# Patient Record
Sex: Female | Born: 1950 | Race: White | Hispanic: No | Marital: Married | Smoking: Former smoker
Health system: Southern US, Community
[De-identification: ages and names within clinical notes are randomized; demographics above are authoritative.]

## PROBLEM LIST (undated history)

## (undated) DIAGNOSIS — R0600 Dyspnea, unspecified: Secondary | ICD-10-CM

## (undated) DIAGNOSIS — T8859XA Other complications of anesthesia, initial encounter: Secondary | ICD-10-CM

## (undated) DIAGNOSIS — I341 Nonrheumatic mitral (valve) prolapse: Secondary | ICD-10-CM

## (undated) DIAGNOSIS — Z9889 Other specified postprocedural states: Secondary | ICD-10-CM

## (undated) DIAGNOSIS — T4145XA Adverse effect of unspecified anesthetic, initial encounter: Secondary | ICD-10-CM

## (undated) DIAGNOSIS — D689 Coagulation defect, unspecified: Secondary | ICD-10-CM

## (undated) DIAGNOSIS — N189 Chronic kidney disease, unspecified: Secondary | ICD-10-CM

## (undated) DIAGNOSIS — K5790 Diverticulosis of intestine, part unspecified, without perforation or abscess without bleeding: Secondary | ICD-10-CM

## (undated) DIAGNOSIS — R51 Headache: Secondary | ICD-10-CM

## (undated) DIAGNOSIS — R112 Nausea with vomiting, unspecified: Secondary | ICD-10-CM

## (undated) DIAGNOSIS — E785 Hyperlipidemia, unspecified: Secondary | ICD-10-CM

## (undated) DIAGNOSIS — M1990 Unspecified osteoarthritis, unspecified site: Secondary | ICD-10-CM

## (undated) DIAGNOSIS — H269 Unspecified cataract: Secondary | ICD-10-CM

## (undated) DIAGNOSIS — R519 Headache, unspecified: Secondary | ICD-10-CM

## (undated) DIAGNOSIS — K449 Diaphragmatic hernia without obstruction or gangrene: Secondary | ICD-10-CM

## (undated) DIAGNOSIS — E119 Type 2 diabetes mellitus without complications: Secondary | ICD-10-CM

## (undated) DIAGNOSIS — K861 Other chronic pancreatitis: Secondary | ICD-10-CM

## (undated) DIAGNOSIS — D649 Anemia, unspecified: Secondary | ICD-10-CM

## (undated) DIAGNOSIS — K760 Fatty (change of) liver, not elsewhere classified: Secondary | ICD-10-CM

## (undated) DIAGNOSIS — Z87442 Personal history of urinary calculi: Secondary | ICD-10-CM

## (undated) DIAGNOSIS — K219 Gastro-esophageal reflux disease without esophagitis: Secondary | ICD-10-CM

## (undated) DIAGNOSIS — I10 Essential (primary) hypertension: Secondary | ICD-10-CM

## (undated) HISTORY — DX: Gastro-esophageal reflux disease without esophagitis: K21.9

## (undated) HISTORY — PX: BREAST BIOPSY: SHX20

## (undated) HISTORY — PX: TONSILLECTOMY: SUR1361

## (undated) HISTORY — DX: Diaphragmatic hernia without obstruction or gangrene: K44.9

## (undated) HISTORY — DX: Hyperlipidemia, unspecified: E78.5

## (undated) HISTORY — PX: PARTIAL HYSTERECTOMY: SHX80

## (undated) HISTORY — PX: OTHER SURGICAL HISTORY: SHX169

## (undated) HISTORY — DX: Fatty (change of) liver, not elsewhere classified: K76.0

## (undated) HISTORY — DX: Anemia, unspecified: D64.9

## (undated) HISTORY — DX: Unspecified osteoarthritis, unspecified site: M19.90

## (undated) HISTORY — DX: Chronic kidney disease, unspecified: N18.9

## (undated) HISTORY — DX: Nonrheumatic mitral (valve) prolapse: I34.1

## (undated) HISTORY — DX: Diverticulosis of intestine, part unspecified, without perforation or abscess without bleeding: K57.90

## (undated) HISTORY — DX: Unspecified cataract: H26.9

## (undated) HISTORY — DX: Coagulation defect, unspecified: D68.9

---

## 1978-06-07 HISTORY — PX: TUBAL LIGATION: SHX77

## 1998-06-07 HISTORY — PX: BREAST SURGERY: SHX581

## 1998-09-10 ENCOUNTER — Other Ambulatory Visit: Admission: RE | Admit: 1998-09-10 | Discharge: 1998-09-10 | Payer: Self-pay | Admitting: Obstetrics and Gynecology

## 1999-05-12 ENCOUNTER — Encounter: Payer: Self-pay | Admitting: Emergency Medicine

## 1999-05-12 ENCOUNTER — Encounter: Admission: RE | Admit: 1999-05-12 | Discharge: 1999-05-12 | Payer: Self-pay | Admitting: Emergency Medicine

## 2000-10-17 ENCOUNTER — Encounter: Admission: RE | Admit: 2000-10-17 | Discharge: 2000-10-17 | Payer: Self-pay

## 2001-10-18 ENCOUNTER — Encounter: Admission: RE | Admit: 2001-10-18 | Discharge: 2001-10-18 | Payer: Self-pay | Admitting: Obstetrics and Gynecology

## 2001-10-18 ENCOUNTER — Encounter: Payer: Self-pay | Admitting: Obstetrics and Gynecology

## 2002-11-09 ENCOUNTER — Encounter: Payer: Self-pay | Admitting: Obstetrics and Gynecology

## 2002-11-09 ENCOUNTER — Encounter: Admission: RE | Admit: 2002-11-09 | Discharge: 2002-11-09 | Payer: Self-pay | Admitting: Obstetrics and Gynecology

## 2002-12-07 ENCOUNTER — Encounter: Payer: Self-pay | Admitting: Obstetrics and Gynecology

## 2002-12-07 ENCOUNTER — Encounter: Admission: RE | Admit: 2002-12-07 | Discharge: 2002-12-07 | Payer: Self-pay | Admitting: Obstetrics and Gynecology

## 2003-06-08 HISTORY — PX: CATARACT EXTRACTION: SUR2

## 2003-11-19 ENCOUNTER — Encounter: Admission: RE | Admit: 2003-11-19 | Discharge: 2003-11-19 | Payer: Self-pay | Admitting: Obstetrics and Gynecology

## 2004-10-19 ENCOUNTER — Encounter: Admission: RE | Admit: 2004-10-19 | Discharge: 2004-10-19 | Payer: Self-pay | Admitting: Emergency Medicine

## 2004-10-22 ENCOUNTER — Encounter: Admission: RE | Admit: 2004-10-22 | Discharge: 2004-10-22 | Payer: Self-pay | Admitting: Emergency Medicine

## 2004-12-01 ENCOUNTER — Encounter: Admission: RE | Admit: 2004-12-01 | Discharge: 2004-12-01 | Payer: Self-pay | Admitting: Obstetrics and Gynecology

## 2005-12-02 ENCOUNTER — Encounter: Admission: RE | Admit: 2005-12-02 | Discharge: 2005-12-02 | Payer: Self-pay | Admitting: Obstetrics and Gynecology

## 2006-05-07 LAB — HM COLONOSCOPY: HM Colonoscopy: NORMAL

## 2006-05-17 ENCOUNTER — Encounter: Admission: RE | Admit: 2006-05-17 | Discharge: 2006-05-17 | Payer: Self-pay | Admitting: Emergency Medicine

## 2006-12-06 ENCOUNTER — Encounter: Admission: RE | Admit: 2006-12-06 | Discharge: 2006-12-06 | Payer: Self-pay | Admitting: Obstetrics and Gynecology

## 2006-12-06 LAB — CONVERTED CEMR LAB: Pap Smear: NORMAL

## 2007-01-29 ENCOUNTER — Encounter: Admission: RE | Admit: 2007-01-29 | Discharge: 2007-01-29 | Payer: Self-pay | Admitting: Orthopedic Surgery

## 2007-12-07 ENCOUNTER — Encounter: Admission: RE | Admit: 2007-12-07 | Discharge: 2007-12-07 | Payer: Self-pay | Admitting: Obstetrics and Gynecology

## 2008-06-07 HISTORY — PX: CATARACT EXTRACTION: SUR2

## 2008-07-05 ENCOUNTER — Ambulatory Visit: Payer: Self-pay | Admitting: Family Medicine

## 2008-07-05 DIAGNOSIS — Z87442 Personal history of urinary calculi: Secondary | ICD-10-CM | POA: Insufficient documentation

## 2008-07-05 DIAGNOSIS — K219 Gastro-esophageal reflux disease without esophagitis: Secondary | ICD-10-CM | POA: Insufficient documentation

## 2008-07-05 DIAGNOSIS — M1990 Unspecified osteoarthritis, unspecified site: Secondary | ICD-10-CM | POA: Insufficient documentation

## 2008-07-05 DIAGNOSIS — G4700 Insomnia, unspecified: Secondary | ICD-10-CM | POA: Insufficient documentation

## 2008-07-05 DIAGNOSIS — E1169 Type 2 diabetes mellitus with other specified complication: Secondary | ICD-10-CM | POA: Insufficient documentation

## 2008-07-05 DIAGNOSIS — Z8679 Personal history of other diseases of the circulatory system: Secondary | ICD-10-CM | POA: Insufficient documentation

## 2008-07-05 DIAGNOSIS — J019 Acute sinusitis, unspecified: Secondary | ICD-10-CM | POA: Insufficient documentation

## 2008-07-05 DIAGNOSIS — E785 Hyperlipidemia, unspecified: Secondary | ICD-10-CM | POA: Insufficient documentation

## 2008-07-05 DIAGNOSIS — G43109 Migraine with aura, not intractable, without status migrainosus: Secondary | ICD-10-CM | POA: Insufficient documentation

## 2008-07-26 ENCOUNTER — Encounter: Payer: Self-pay | Admitting: Family Medicine

## 2008-08-12 ENCOUNTER — Ambulatory Visit (HOSPITAL_BASED_OUTPATIENT_CLINIC_OR_DEPARTMENT_OTHER): Admission: RE | Admit: 2008-08-12 | Discharge: 2008-08-12 | Payer: Self-pay | Admitting: Urology

## 2008-08-12 HISTORY — PX: LITHOTRIPSY: SUR834

## 2008-08-28 ENCOUNTER — Ambulatory Visit: Payer: Self-pay | Admitting: Family Medicine

## 2008-08-28 LAB — CONVERTED CEMR LAB
ALT: 21 units/L (ref 0–35)
AST: 25 units/L (ref 0–37)
Albumin: 3.9 g/dL (ref 3.5–5.2)
Alkaline Phosphatase: 81 units/L (ref 39–117)
BUN: 22 mg/dL (ref 6–23)
Bilirubin, Direct: 0 mg/dL (ref 0.0–0.3)
CO2: 30 meq/L (ref 19–32)
Calcium: 9.5 mg/dL (ref 8.4–10.5)
Chloride: 107 meq/L (ref 96–112)
Cholesterol: 230 mg/dL — ABNORMAL HIGH (ref 0–200)
Creatinine, Ser: 1 mg/dL (ref 0.4–1.2)
Direct LDL: 151.4 mg/dL
GFR calc non Af Amer: 60.67 mL/min (ref >60)
Glucose, Bld: 112 mg/dL — ABNORMAL HIGH (ref 70–99)
HDL: 53.7 mg/dL (ref >39.00)
Potassium: 3.4 meq/L — ABNORMAL LOW (ref 3.5–5.1)
Sodium: 145 meq/L (ref 135–145)
Total Bilirubin: 0.9 mg/dL (ref 0.3–1.2)
Total CHOL/HDL Ratio: 4
Total Protein: 7.1 g/dL (ref 6.0–8.3)
Triglycerides: 176 mg/dL — ABNORMAL HIGH (ref 0.0–149.0)
VLDL: 35.2 mg/dL (ref 0.0–40.0)

## 2008-09-03 ENCOUNTER — Encounter: Payer: Self-pay | Admitting: Family Medicine

## 2008-09-03 ENCOUNTER — Ambulatory Visit: Payer: Self-pay | Admitting: Family Medicine

## 2008-09-03 ENCOUNTER — Other Ambulatory Visit: Admission: RE | Admit: 2008-09-03 | Discharge: 2008-09-03 | Payer: Self-pay | Admitting: Family Medicine

## 2008-09-03 LAB — HM PAP SMEAR

## 2008-09-05 ENCOUNTER — Encounter (INDEPENDENT_AMBULATORY_CARE_PROVIDER_SITE_OTHER): Payer: Self-pay | Admitting: *Deleted

## 2008-09-27 ENCOUNTER — Encounter: Payer: Self-pay | Admitting: Family Medicine

## 2008-10-30 ENCOUNTER — Encounter: Payer: Self-pay | Admitting: Family Medicine

## 2008-12-05 ENCOUNTER — Ambulatory Visit: Payer: Self-pay | Admitting: Family Medicine

## 2008-12-10 ENCOUNTER — Encounter: Admission: RE | Admit: 2008-12-10 | Discharge: 2008-12-10 | Payer: Self-pay | Admitting: Obstetrics and Gynecology

## 2008-12-11 ENCOUNTER — Encounter: Payer: Self-pay | Admitting: Family Medicine

## 2008-12-11 LAB — CONVERTED CEMR LAB
BUN: 18 mg/dL (ref 6–23)
CO2: 34 meq/L — ABNORMAL HIGH (ref 19–32)
Calcium: 9.4 mg/dL (ref 8.4–10.5)
Chloride: 102 meq/L (ref 96–112)
Cholesterol: 239 mg/dL — ABNORMAL HIGH (ref 0–200)
Creatinine, Ser: 0.9 mg/dL (ref 0.4–1.2)
Direct LDL: 169 mg/dL
GFR calc non Af Amer: 68.45 mL/min (ref >60)
Glucose, Bld: 99 mg/dL (ref 70–99)
HDL: 65.5 mg/dL (ref >39.00)
Potassium: 4.3 meq/L (ref 3.5–5.1)
Sodium: 144 meq/L (ref 135–145)
Total CHOL/HDL Ratio: 4
Triglycerides: 107 mg/dL (ref 0.0–149.0)
VLDL: 21.4 mg/dL (ref 0.0–40.0)

## 2009-02-26 ENCOUNTER — Ambulatory Visit: Payer: Self-pay | Admitting: Family Medicine

## 2009-02-27 ENCOUNTER — Encounter: Payer: Self-pay | Admitting: Family Medicine

## 2009-02-27 LAB — CONVERTED CEMR LAB
ALT: 23 units/L (ref 0–35)
AST: 25 units/L (ref 0–37)
Cholesterol: 209 mg/dL — ABNORMAL HIGH (ref 0–200)
Direct LDL: 143.7 mg/dL
HDL: 52.6 mg/dL (ref >39.00)
Total CHOL/HDL Ratio: 4
Triglycerides: 94 mg/dL (ref 0.0–149.0)
VLDL: 18.8 mg/dL (ref 0.0–40.0)

## 2009-03-19 ENCOUNTER — Telehealth: Payer: Self-pay | Admitting: Family Medicine

## 2009-04-24 ENCOUNTER — Ambulatory Visit: Payer: Self-pay | Admitting: Family Medicine

## 2009-06-12 ENCOUNTER — Ambulatory Visit: Payer: Self-pay | Admitting: Family Medicine

## 2009-06-12 DIAGNOSIS — R635 Abnormal weight gain: Secondary | ICD-10-CM | POA: Insufficient documentation

## 2009-06-13 ENCOUNTER — Telehealth: Payer: Self-pay | Admitting: Family Medicine

## 2009-06-13 ENCOUNTER — Encounter: Payer: Self-pay | Admitting: Family Medicine

## 2009-07-02 ENCOUNTER — Encounter: Payer: Self-pay | Admitting: Family Medicine

## 2009-09-03 ENCOUNTER — Ambulatory Visit: Payer: Self-pay | Admitting: Family Medicine

## 2009-09-03 LAB — CONVERTED CEMR LAB
ALT: 22 units/L (ref 0–35)
AST: 26 units/L (ref 0–37)
Albumin: 3.6 g/dL (ref 3.5–5.2)
Alkaline Phosphatase: 66 units/L (ref 39–117)
BUN: 20 mg/dL (ref 6–23)
Basophils Absolute: 0 10*3/uL (ref 0.0–0.1)
Basophils Relative: 0.1 % (ref 0.0–3.0)
Bilirubin, Direct: 0 mg/dL (ref 0.0–0.3)
CO2: 31 meq/L (ref 19–32)
Calcium: 9.1 mg/dL (ref 8.4–10.5)
Chloride: 102 meq/L (ref 96–112)
Cholesterol: 194 mg/dL (ref 0–200)
Creatinine, Ser: 0.9 mg/dL (ref 0.4–1.2)
Eosinophils Absolute: 0.3 10*3/uL (ref 0.0–0.7)
Eosinophils Relative: 4.2 % (ref 0.0–5.0)
GFR calc non Af Amer: 68.27 mL/min (ref >60)
Glucose, Bld: 109 mg/dL — ABNORMAL HIGH (ref 70–99)
HCT: 42 % (ref 36.0–46.0)
HDL: 57 mg/dL (ref >39.00)
Hemoglobin: 13.5 g/dL (ref 12.0–15.0)
LDL Cholesterol: 113 mg/dL — ABNORMAL HIGH (ref 0–99)
Lymphocytes Relative: 21.8 % (ref 12.0–46.0)
Lymphs Abs: 1.4 10*3/uL (ref 0.7–4.0)
MCHC: 32.1 g/dL (ref 30.0–36.0)
MCV: 92.7 fL (ref 78.0–100.0)
Monocytes Absolute: 0.5 10*3/uL (ref 0.1–1.0)
Monocytes Relative: 8.2 % (ref 3.0–12.0)
Neutro Abs: 4.3 10*3/uL (ref 1.4–7.7)
Neutrophils Relative %: 65.7 % (ref 43.0–77.0)
Platelets: 250 10*3/uL (ref 150.0–400.0)
Potassium: 3.8 meq/L (ref 3.5–5.1)
RBC: 4.53 M/uL (ref 3.87–5.11)
RDW: 13.4 % (ref 11.5–14.6)
Sodium: 142 meq/L (ref 135–145)
TSH: 1.35 microintl units/mL (ref 0.35–5.50)
Total Bilirubin: 0.5 mg/dL (ref 0.3–1.2)
Total CHOL/HDL Ratio: 3
Total Protein: 6.7 g/dL (ref 6.0–8.3)
Triglycerides: 118 mg/dL (ref 0.0–149.0)
VLDL: 23.6 mg/dL (ref 0.0–40.0)
WBC: 6.5 10*3/uL (ref 4.5–10.5)

## 2009-09-16 ENCOUNTER — Ambulatory Visit: Payer: Self-pay | Admitting: Family Medicine

## 2009-09-16 LAB — CONVERTED CEMR LAB
Cholesterol, target level: 200 mg/dL
HDL goal, serum: 40 mg/dL
LDL Goal: 160 mg/dL

## 2009-09-17 ENCOUNTER — Telehealth: Payer: Self-pay | Admitting: Family Medicine

## 2009-10-23 ENCOUNTER — Telehealth: Payer: Self-pay | Admitting: Family Medicine

## 2009-11-06 ENCOUNTER — Telehealth: Payer: Self-pay | Admitting: Family Medicine

## 2009-11-06 DIAGNOSIS — M810 Age-related osteoporosis without current pathological fracture: Secondary | ICD-10-CM | POA: Insufficient documentation

## 2009-11-06 DIAGNOSIS — M8580 Other specified disorders of bone density and structure, unspecified site: Secondary | ICD-10-CM | POA: Insufficient documentation

## 2009-12-01 ENCOUNTER — Encounter: Payer: Self-pay | Admitting: Family Medicine

## 2009-12-17 ENCOUNTER — Encounter: Payer: Self-pay | Admitting: Family Medicine

## 2009-12-18 ENCOUNTER — Encounter: Admission: RE | Admit: 2009-12-18 | Discharge: 2009-12-18 | Payer: Self-pay | Admitting: Family Medicine

## 2009-12-18 LAB — HM MAMMOGRAPHY: HM Mammogram: NEGATIVE

## 2009-12-23 ENCOUNTER — Encounter (INDEPENDENT_AMBULATORY_CARE_PROVIDER_SITE_OTHER): Payer: Self-pay | Admitting: *Deleted

## 2010-01-28 ENCOUNTER — Ambulatory Visit: Payer: Self-pay | Admitting: Family Medicine

## 2010-02-26 ENCOUNTER — Encounter (INDEPENDENT_AMBULATORY_CARE_PROVIDER_SITE_OTHER): Payer: Self-pay | Admitting: *Deleted

## 2010-02-26 ENCOUNTER — Ambulatory Visit: Payer: Self-pay | Admitting: Family Medicine

## 2010-02-26 LAB — CONVERTED CEMR LAB
ALT: 21 units/L (ref 0–35)
AST: 24 units/L (ref 0–37)
Cholesterol: 228 mg/dL — ABNORMAL HIGH (ref 0–200)
Direct LDL: 164.4 mg/dL
HDL: 61.3 mg/dL (ref >39.00)
Total CHOL/HDL Ratio: 4
Triglycerides: 104 mg/dL (ref 0.0–149.0)
VLDL: 20.8 mg/dL (ref 0.0–40.0)

## 2010-02-27 ENCOUNTER — Encounter: Payer: Self-pay | Admitting: Family Medicine

## 2010-06-07 HISTORY — PX: OTHER SURGICAL HISTORY: SHX169

## 2010-06-10 ENCOUNTER — Encounter: Payer: Self-pay | Admitting: Family Medicine

## 2010-07-06 ENCOUNTER — Encounter: Payer: Self-pay | Admitting: Family Medicine

## 2010-07-07 ENCOUNTER — Other Ambulatory Visit: Payer: Self-pay | Admitting: Family Medicine

## 2010-07-07 ENCOUNTER — Telehealth (INDEPENDENT_AMBULATORY_CARE_PROVIDER_SITE_OTHER): Payer: Self-pay | Admitting: *Deleted

## 2010-07-07 ENCOUNTER — Ambulatory Visit
Admission: RE | Admit: 2010-07-07 | Discharge: 2010-07-07 | Payer: Self-pay | Source: Home / Self Care | Attending: Family Medicine | Admitting: Family Medicine

## 2010-07-07 LAB — LIPID PANEL
Cholesterol: 207 mg/dL — ABNORMAL HIGH (ref 0–200)
HDL: 53.6 mg/dL (ref >39.00)
Total CHOL/HDL Ratio: 4
Triglycerides: 98 mg/dL (ref 0.0–149.0)
VLDL: 19.6 mg/dL (ref 0.0–40.0)

## 2010-07-07 LAB — HEPATIC FUNCTION PANEL
ALT: 22 U/L (ref 0–35)
AST: 25 U/L (ref 0–37)
Albumin: 3.7 g/dL (ref 3.5–5.2)
Alkaline Phosphatase: 74 U/L (ref 39–117)
Bilirubin, Direct: 0.1 mg/dL (ref 0.0–0.3)
Total Bilirubin: 0.5 mg/dL (ref 0.3–1.2)
Total Protein: 6.7 g/dL (ref 6.0–8.3)

## 2010-07-07 LAB — BASIC METABOLIC PANEL
BUN: 14 mg/dL (ref 6–23)
CO2: 31 mEq/L (ref 19–32)
Calcium: 9.2 mg/dL (ref 8.4–10.5)
Chloride: 101 mEq/L (ref 96–112)
Creatinine, Ser: 0.8 mg/dL (ref 0.4–1.2)
GFR: 77.98 mL/min (ref >60.00)
Glucose, Bld: 115 mg/dL — ABNORMAL HIGH (ref 70–99)
Potassium: 4 mEq/L (ref 3.5–5.1)
Sodium: 142 mEq/L (ref 135–145)

## 2010-07-07 LAB — LDL CHOLESTEROL, DIRECT: Direct LDL: 151.8 mg/dL

## 2010-07-09 NOTE — Progress Notes (Signed)
Summary: needs order for bone density  Phone Note Call from Patient   Caller: Patient Call For: Kerby Nora MD Summary of Call: Pt has scheduled a bone density test for 12/18/09 at 10:40 at the breast center in [place].  She will need order faxed to them. Initial call taken by: Lowella Petties CMA,  November 06, 2009 10:09 AM  New Problems: SPECIAL SCREENING FOR OSTEOPOROSIS (ICD-V82.81)   New Problems: SPECIAL SCREENING FOR OSTEOPOROSIS (ICD-V82.81)

## 2010-07-09 NOTE — Progress Notes (Signed)
Summary: Lab appt rescheduled  Phone Note Call from Patient   Caller: Patient Call For: Kerby Nora MD Summary of Call: Patient called to reschedule her lab appointment from 08/08/2009 to 09/10/2009.   Initial call taken by: Linde Gillis CMA [facility]),  June 13, 2009 11:41 AM

## 2010-07-09 NOTE — Letter (Signed)
Summary: [facility]  [facility]   Imported By: Lanelle Bal 07/09/2009 14:20:25  _____________________________________________________________________  External Attachment:    Type:   Image     Comment:   External Document

## 2010-07-09 NOTE — Assessment & Plan Note (Signed)
Summary: FLU SHOT/CLE  Nurse Visit   Allergies: 1)  ! Penicillin 2)  ! Codeine 3)  ! Talwin  Immunizations Administered:  Influenza Vaccine # 1:    Vaccine Type: Fluvax 3+    Site: left deltoid    Mfr: GlaxoSmithKline    Dose: 0.5 ml    Route: IM    Given by: Mervin Hack CMA (AAMA)    Exp. Date: 12/05/2010    Lot #: [id number]    VIS given: 12/30/09 version given February 26, 2010.  Flu Vaccine Consent Questions:    Do you have a history of severe allergic reactions to this vaccine? no    Any prior history of allergic reactions to egg and/or gelatin? no    Do you have a sensitivity to the preservative Thimersol? no    Do you have a past history of Guillan-Barre Syndrome? no    Do you currently have an acute febrile illness? no    Have you ever had a severe reaction to latex? no    Vaccine information given and explained to patient? yes    Are you currently pregnant? no  Orders Added: 1)  Flu Vaccine 56yrs + [90658] 2)  Admin 1st Vaccine [87564]

## 2010-07-09 NOTE — Miscellaneous (Signed)
  Clinical Lists Changes  Medications: Rx of VYTORIN 10-40 MG TABS (EZETIMIBE-SIMVASTATIN) Take 1 tablet by mouth once a day;  #30 x 3;  Signed;  Entered by: Benny Lennert CMA (AAMA);  Authorized by: Kerby Nora MD;  Method used: Electronically to [facility]*, [address], [place], [place]  [address], Ph: [phone], Fax: [phone]    Prescriptions: VYTORIN 10-40 MG TABS (EZETIMIBE-SIMVASTATIN) Take 1 tablet by mouth once a day  #30 x 3   Entered by:   Benny Lennert CMA (AAMA)   Authorized by:   Kerby Nora MD   Signed by:   Benny Lennert CMA (AAMA) on 02/27/2010   Method used:   Electronically to        [facility]* (retail)       [address]       [place], [place]  [address]       Ph: [phone]       Fax: [phone]   RxID:   [id number]   Prior Medications: INDAPAMIDE 1.25 MG TABS (INDAPAMIDE) Take 1 tablet by mouth every morning POTASSIUM CITRATE  GRAN (POTASSIUM CITRATE) 5 micrograms.  two times a day VITAMIN D 1000 UNIT  TABS (CHOLECALCIFEROL) Take 1 tablet by mouth every morning GLUCOSAMINE-CHONDROITIN   CAPS (GLUCOSAMINE-CHONDROIT-VIT C-MN) Take 1 tablet by mouth every morning SIMVASTATIN 40 MG TABS (SIMVASTATIN) Take 1 tablet by mouth once a day GABAPENTIN 300 MG CAPS (GABAPENTIN) take one tablet by mouth once daily as needed OMEPRAZOLE 40 MG CPDR (OMEPRAZOLE) 1 tab by mouth daily x 4-6 weeks then if symptoms resolved taper off FOR REFLUX MELOXICAM 15 MG TABS (MELOXICAM) one tablet by mouth at bedtime TREXIMET 85-500 MG TABS (SUMATRIPTAN-NAPROXEN SODIUM) prn ESTRACE 0.1 MG/GM CREA (ESTRADIOL) twice weekly vaginally CLOTRIMAZOLE-BETAMETHASONE 1-0.05 % CREA (CLOTRIMAZOLE-BETAMETHASONE) use as needed for vaginal itching COSAMIN DS 500-400 MG CAPS (GLUCOSAMINE-CHONDROITIN) take one tablet by mouth every morning Current Allergies: ! PENICILLIN ! CODEINE ! TALWIN

## 2010-07-09 NOTE — Progress Notes (Signed)
Summary: pt wants to change from vytorin  Phone Note Call from Patient Call back at Home Phone [phone]   Caller: Patient Call For: Kerby Nora MD Summary of Call: Pt wants to change from vytorin to simvastatin.  She will save same money if she changes.  [facility].  Please let pt know. Initial call taken by: Lowella Petties CMA,  Oct 23, 2009 2:22 PM  Follow-up for Phone Call        let pt kow it may not be as effective, but we can try.  Recheck fasting LIPIDS, AST, ALT  in 3 months Dx 272.0     Additional Follow-up for Phone Call Additional follow up Details #1::        Advised pt., LMOM asking her to call to schedule lab appt.              Lowella Petties CMA  Oct 24, 2009 12:53 PM     New/Updated Medications: SIMVASTATIN 40 MG TABS (SIMVASTATIN) Take 1 tablet by mouth once a day Prescriptions: SIMVASTATIN 40 MG TABS (SIMVASTATIN) Take 1 tablet by mouth once a day  #30 x 11   Entered and Authorized by:   Kerby Nora MD   Signed by:   Kerby Nora MD on 10/24/2009   Method used:   Electronically to        [facility]* (retail)       [address]       [place], [place]  [address]       Ph: [phone]       Fax: [phone]   RxID:   [id number]

## 2010-07-09 NOTE — Letter (Signed)
Summary: [facility]  [facility]   Imported By: Lanelle Bal 06/23/2009 10:11:31  _____________________________________________________________________  External Attachment:    Type:   Image     Comment:   External Document

## 2010-07-09 NOTE — Progress Notes (Signed)
Summary: Rx Meloxicam  Phone Note Refill Request Call back at [phone] Message from:  [facility] on September 17, 2009 11:24 AM  Refills Requested: Medication #1:  MELOXICAM 15 MG TABS one tablet by mouth at bedtime   Last Refilled: 01/16/2009 Received faxed refill request please advise.   Method Requested: Electronic Initial call taken by: Linde Gillis CMA [facility]),  September 17, 2009 11:25 AM    Prescriptions: MELOXICAM 15 MG TABS (MELOXICAM) one tablet by mouth at bedtime  #30 x 5   Entered and Authorized by:   Kerby Nora MD   Signed by:   Kerby Nora MD on 09/17/2009   Method used:   Electronically to        [facility]* (retail)       [address]       [place], [place]  [address]       Ph: [phone]       Fax: [phone]   RxID:   [id number]

## 2010-07-09 NOTE — Letter (Signed)
Summary: [facility]  [facility]   Imported By: Lanelle Bal 12/12/2009 13:21:41  _____________________________________________________________________  External Attachment:    Type:   Image     Comment:   External Document

## 2010-07-09 NOTE — Miscellaneous (Signed)
  Clinical Lists Changes  Medications: Added new medication of CRESTOR 20 MG TABS (ROSUVASTATIN CALCIUM) take one tablet daily - Signed Rx of CRESTOR 20 MG TABS (ROSUVASTATIN CALCIUM) take one tablet daily;  #30 x 6;  Signed;  Entered by: Benny Lennert CMA (AAMA);  Authorized by: Kerby Nora MD;  Method used: Electronically to [facility]*, [address], [place], [place]  [address], Ph: [phone], Fax: [phone]    Prescriptions: CRESTOR 20 MG TABS (ROSUVASTATIN CALCIUM) take one tablet daily  #30 x 6   Entered by:   Benny Lennert CMA (AAMA)   Authorized by:   Kerby Nora MD   Signed by:   Benny Lennert CMA (AAMA) on 02/26/2010   Method used:   Electronically to        [facility]* (retail)       [address]       [place], [place]  [address]       Ph: [phone]       Fax: [phone]   RxID:   [id number]   Prior Medications: INDAPAMIDE 1.25 MG TABS (INDAPAMIDE) Take 1 tablet by mouth every morning POTASSIUM CITRATE  GRAN (POTASSIUM CITRATE) 5 micrograms.  two times a day VITAMIN D 1000 UNIT  TABS (CHOLECALCIFEROL) Take 1 tablet by mouth every morning GLUCOSAMINE-CHONDROITIN   CAPS (GLUCOSAMINE-CHONDROIT-VIT C-MN) Take 1 tablet by mouth every morning SIMVASTATIN 40 MG TABS (SIMVASTATIN) Take 1 tablet by mouth once a day GABAPENTIN 300 MG CAPS (GABAPENTIN) take one tablet by mouth once daily as needed OMEPRAZOLE 40 MG CPDR (OMEPRAZOLE) 1 tab by mouth daily x 4-6 weeks then if symptoms resolved taper off FOR REFLUX MELOXICAM 15 MG TABS (MELOXICAM) one tablet by mouth at bedtime TREXIMET 85-500 MG TABS (SUMATRIPTAN-NAPROXEN SODIUM) prn ESTRACE 0.1 MG/GM CREA (ESTRADIOL) twice weekly vaginally CLOTRIMAZOLE-BETAMETHASONE 1-0.05 % CREA (CLOTRIMAZOLE-BETAMETHASONE) use as needed for vaginal itching COSAMIN DS 500-400 MG CAPS (GLUCOSAMINE-CHONDROITIN) take one tablet by mouth every morning Current Allergies: ! PENICILLIN ! CODEINE ! TALWIN

## 2010-07-09 NOTE — Assessment & Plan Note (Signed)
Summary: thinks she has acid reflux   Vital Signs:  Patient profile:   60 year old female Height:      61.5 inches Weight:      162.0 pounds BMI:     30.22 Temp:     98.2 degrees F oral Pulse rate:   80 / minute Pulse rhythm:   regular BP sitting:   120 / 78  (left arm) Cuff size:   regular  Vitals Entered By: Benny Lennert CMA [facility]) (June 12, 2009 9:28 AM)  History of Present Illness: Chief complaint discuss acid reflux  Over past few years has had chest pain...treated with Zegrid in past, using  only occassionally now..every 2-3 days.   In last year she has had worsening of symptoms.  Now in last 3 months every day she has burning in  thorat.  Occ has central chest pain about once a week. Occ regurg of food. No dysphagia. No foreign body sensation. No epigastric pain.  No cough, no fever. No URI symptoms.  Eats bland diet, recent incresae in veggies to help lose weight.  Restart exercsing in Fall...5-6 days a week.  20 lb since last 08/2008...some due to migraine meds.   Problems Prior to Update: 1)  Routine Gynecological Examination  (ICD-V72.31) 2)  Well Woman  (ICD-V70.0) 3)  Sinusitis- Acute-nos  (ICD-461.9) 4)  Insomnia, Chronic  (ICD-307.42) 5)  Nephrolithiasis, Hx of  (ICD-V13.01) 6)  Hyperlipidemia  (ICD-272.4) 7)  Mitral Valve Prolapse, Hx of  (ICD-V12.50) 8)  Gerd  (ICD-530.81) 9)  Common Migraine  (ICD-346.10) 10)  Osteoarthritis  (ICD-715.90)  Current Medications (verified): 1)  Naproxen Dr 500 Mg Tbec (Naproxen) .... Take 1 Tab By Mouth At Bedtime 2)  Indapamide 1.25 Mg Tabs (Indapamide) .... Take 1 Tablet By Mouth Every Morning 3)  Potassium Citrate  Gran (Potassium Citrate) .... 5 Micrograms.  Two Times A Day 4)  Vitamin D 1000 Unit  Tabs (Cholecalciferol) .... Take 1 Tablet By Mouth Every Morning 5)  Glucosamine-Chondroitin   Caps (Glucosamine-Chondroit-Vit C-Mn) .... Take 1 Tablet By Mouth Every Morning 6)  Vytorin 10-40 Mg Tabs  (Ezetimibe-Simvastatin) .Marland Kitchen.. 1 Tab By Mouth Daily 7)  Gabapentin 300 Mg Caps (Gabapentin) .... 2-3 Times Daily and As Needed For Migraine 8)  Omeprazole 40 Mg Cpdr (Omeprazole) .Marland Kitchen.. 1 Tab By Mouth Daily X 4-6 Weeks Then If Symptoms Resolved Taper Off For Reflux  Allergies: 1)  ! Penicillin 2)  ! Codeine 3)  ! Talwin  Past History:  Past medical, surgical, family and social histories (including risk factors) reviewed, and no changes noted (except as noted below).  Past Medical History: Reviewed history from 07/05/2008 and no changes required. Osteoarthritis, B knees, left hip, right elbow GERD Hyperlipidemia  Past Surgical History: Reviewed history from 09/03/2008 and no changes required. BTL 1980s 2000 breast bx : benign Tonsillectomy Hysterectomy, partial B ovaries remain, vaginal: for mennorhagia lithotripsy, stent placed B 08/12/2008  Family History: Reviewed history from 07/05/2008 and no changes required. fahter: HTN, pacemaker mother: bone cancer 2 brothers: asthma, kidney stones, arhtritis No MI <age 42 no cancer  Social History: Reviewed history from 07/05/2008 and no changes required. Occupation: retired Midwife Married No children Never Smoked Alcohol use-yes Drug use-yes Regular exercise-yes, recumbent bike n3-4 days a week Diet: fruits and veggies, water, eats at home, drinks  alot of milk  Review of Systems CV:  Complains of chest pain or discomfort. GI:  Complains of indigestion. Endo:  Complains of heat  intolerance and weight change; no hair loss, chronic dry skin. Marland Kitchen  Physical Exam  General:  overwieght appearing female iNNAD Mouth:  MMM Neck:  no carotid bruit or thyromegaly no cervical or supraclavicular lymphadenopathy  Lungs:  Normal respiratory effort, chest expands symmetrically. Lungs are clear to auscultation, no crackles or wheezes. Heart:  Normal rate and regular rhythm. S1 and S2 normal without gallop, murmur, click, rub or other  extra sounds. Abdomen:  Bowel sounds positive,abdomen soft and non-tender without masses, organomegaly or hernias noted. Pulses:  R and L posterior tibial pulses are full and equal bilaterally  Extremities:  no edema   Impression & Recommendations:  Problem # 1:  GERD (ICD-530.81)  Her updated medication list for this problem includes:    Omeprazole 40 Mg Cpdr (Omeprazole) .Marland Kitchen... 1 tab by mouth daily x 4-6 weeks then if symptoms resolved taper off for reflux  Discussed lifestyle modifications, diet, antacids/medications, and preventive measures. Call i not improving in 2 weeks for different PPI>   Her updated medication list for this problem includes:    Omeprazole 40 Mg Cpdr (Omeprazole) .Marland Kitchen... 1 tab by mouth daily x 4-6 weeks then if symptoms resolved taper off for reflux  Problem # 2:  WEIGHT GAIN, ABNORMAL (ICD-783.1)  likely due to migraine meds and postmenopausal. Spent 20 min face to face counsling on diet. Encouraged exercise, weight loss, healthy eating habits.  Decrease portion size, try alli, increase exercise.  If not improving can cosider checking TSH .  Complete Medication List: 1)  Naproxen Dr 500 Mg Tbec (Naproxen) .... Take 1 tab by mouth at bedtime 2)  Indapamide 1.25 Mg Tabs (Indapamide) .... Take 1 tablet by mouth every morning 3)  Potassium Citrate Gran (Potassium citrate) .... 5 micrograms.  two times a day 4)  Vitamin D 1000 Unit Tabs (Cholecalciferol) .... Take 1 tablet by mouth every morning 5)  Glucosamine-chondroitin Caps (Glucosamine-chondroit-vit c-mn) .... Take 1 tablet by mouth every morning 6)  Vytorin 10-40 Mg Tabs (Ezetimibe-simvastatin) .Marland Kitchen.. 1 tab by mouth daily 7)  Gabapentin 300 Mg Caps (Gabapentin) .... 2-3 times daily and as needed for migraine 8)  Omeprazole 40 Mg Cpdr (Omeprazole) .Marland Kitchen.. 1 tab by mouth daily x 4-6 weeks then if symptoms resolved taper off for reflux  Patient Instructions: 1)  Start omeprazole 40 mg daily x 4-6 week, taper off  gradually. 2)  Call if no improvement in 2 weeks for different PPI. 3)  Consider ALLI for weight loss.  4)  Follow up in not improiving. 5)  CPE in 08/2009, fasting labs prior.  Prescriptions: OMEPRAZOLE 40 MG CPDR (OMEPRAZOLE) 1 tab by mouth daily x 4-6 weeks then if symptoms resolved taper off FOR REFLUX  #30 x 3   Entered and Authorized by:   Kerby Nora MD   Signed by:   Kerby Nora MD on 06/12/2009   Method used:   Electronically to        [facility]* (retail)       [address]       [place], [place]  [address]       Ph: [phone]       Fax: [phone]   RxID:   [id number]   Current Allergies (reviewed today): ! PENICILLIN ! CODEINE ! TALWIN

## 2010-07-09 NOTE — Letter (Signed)
Summary: [facility]  [facility]   Imported By: Lanelle Bal 06/23/2010 08:22:26  _____________________________________________________________________  External Attachment:    Type:   Image     Comment:   External Document

## 2010-07-09 NOTE — Letter (Signed)
Summary: Results Follow up Letter  [place] [facility]  [address] [place], [place] [address]   Phone: [phone]  Fax: [phone]    12/23/2009 MRN: [id number]     [facility] [address] [place], [place]  [address]    Dear Debra Leach,  The following are the results of your recent test(s):  Test         Result    Pap Smear:        Normal _____  Not Normal _____ Comments: ______________________________________________________ Cholesterol: LDL(Bad cholesterol):         Your goal is less than:         HDL (Good cholesterol):       Your goal is more than: Comments:  ______________________________________________________ Mammogram:        Normal _____  Not Normal _____ Comments:  ___________________________________________________________________ Hemoccult:        Normal _____  Not normal _______ Comments:    _____________________________________________________________________ Other Tests:Bone Density:Notify pt that she has evidence of mild bone loss...osteopenia. Recommend Ca/ vit D 400mg /600IU and weight bearing exercise. Recheck in 2 years    We routinely do not discuss normal results over the telephone.  If you desire a copy of the results, or you have any questions about this information we can discuss them at your next office visit.   Sincerely,  Kerby Nora MD

## 2010-07-09 NOTE — Assessment & Plan Note (Signed)
Summary: CPX/DLO   Vital Signs:  Patient profile:   60 year old female Height:      61.5 inches Weight:      155.50 pounds BMI:     29.01 Temp:     98.3 degrees F oral Pulse rate:   76 / minute Pulse rhythm:   regular BP sitting:   100 / 70  (left arm) Cuff size:   regular  Vitals Entered By: Linde Gillis CMA Duncan Dull) (September 16, 2009 11:05 AM) CC: 30 minute exam, Lipid Management   History of Present Illness: The patient is here for annual wellness exam and preventative care.     GERD, chest pain well controlled.  Diet changes made. Only rarely using omeprazole.   Smoking history, prednisone use for migraines in past, no family history, ? early menopause...needs bone density  Lipid Management History:      Positive NCEP/ATP III risk factors include female age 88 years old or older.  Negative NCEP/ATP III risk factors include non-tobacco-user status.        The patient expresses understanding of adjunctive measures for cholesterol lowering.  Adjunctive measures started by the patient include aerobic exercise, fiber, and weight reduction.  She expresses no side effects from her lipid-lowering medication.  The patient denies any symptoms to suggest myopathy or liver disease.     Problems Prior to Update: 1)  Weight Gain, Abnormal  (ICD-783.1) 2)  Routine Gynecological Examination  (ICD-V72.31) 3)  Well Woman  (ICD-V70.0) 4)  Sinusitis- Acute-nos  (ICD-461.9) 5)  Insomnia, Chronic  (ICD-307.42) 6)  Nephrolithiasis, Hx of  (ICD-V13.01) 7)  Hyperlipidemia  (ICD-272.4) 8)  Mitral Valve Prolapse, Hx of  (ICD-V12.50) 9)  Gerd  (ICD-530.81) 10)  Common Migraine  (ICD-346.10) 11)  Osteoarthritis  (ICD-715.90)  Current Medications (verified): 1)  Indapamide 1.25 Mg Tabs (Indapamide) .... Take 1 Tablet By Mouth Every Morning 2)  Potassium Citrate  Gran (Potassium Citrate) .... 5 Micrograms.  Two Times A Day 3)  Vitamin D 1000 Unit  Tabs (Cholecalciferol) .... Take 1 Tablet By Mouth  Every Morning 4)  Glucosamine-Chondroitin   Caps (Glucosamine-Chondroit-Vit C-Mn) .... Take 1 Tablet By Mouth Every Morning 5)  Vytorin 10-40 Mg Tabs (Ezetimibe-Simvastatin) .Marland Kitchen.. 1 Tab By Mouth Daily 6)  Gabapentin 300 Mg Caps (Gabapentin) .... Take One Tablet By Mouth Once Daily As Needed 7)  Omeprazole 40 Mg Cpdr (Omeprazole) .Marland Kitchen.. 1 Tab By Mouth Daily X 4-6 Weeks Then If Symptoms Resolved Taper Off For Reflux 8)  Meloxicam 15 Mg Tabs (Meloxicam) .... One Tablet By Mouth At Bedtime 9)  Treximet 85-500 Mg Tabs (Sumatriptan-Naproxen Sodium) .... Prn 10)  Estrace 0.1 Mg/gm Crea (Estradiol) .... Twice Weekly Vaginally 11)  Clotrimazole-Betamethasone 1-0.05 % Crea (Clotrimazole-Betamethasone) .... Use As Needed For Vaginal Itching 12)  Cosamin Ds 500-400 Mg Caps (Glucosamine-Chondroitin) .... Take One Tablet By Mouth Every Morning  Allergies: 1)  ! Penicillin 2)  ! Codeine 3)  ! Talwin  Past History:  Past medical, surgical, family and social histories (including risk factors) reviewed, and no changes noted (except as noted below).  Past Medical History: Reviewed history from 07/05/2008 and no changes required. Osteoarthritis, B knees, left hip, right elbow GERD Hyperlipidemia  Past Surgical History: Reviewed history from 09/03/2008 and no changes required. BTL 1980s 2000 breast bx : benign Tonsillectomy Hysterectomy, partial B ovaries remain, vaginal: for mennorhagia lithotripsy, stent placed B 08/12/2008  Family History: Reviewed history from 07/05/2008 and no changes required. fahter: HTN, pacemaker mother: bone  cancer 2 brothers: asthma, kidney stones, arhtritis No MI <age 71 no cancer  Social History: Reviewed history from 07/05/2008 and no changes required. Occupation: retired Midwife Married No children Never Smoked Alcohol use-yes Drug use-yes Regular exercise-yes, recumbent bike n3-4 days a week Diet: fruits and veggies, water, eats at home, drinks  alot  of milk  Review of Systems General:  Denies fatigue and fever. CV:  Denies chest pain or discomfort. Resp:  Denies cough and shortness of breath. GI:  Complains of abdominal pain and constipation; denies bloody stools and diarrhea; OCc intermitant pain in left lower quadrant..for years. GU:  Denies abnormal vaginal bleeding and dysuria. Derm:  Denies rash. Psych:  Denies anxiety and depression.  Physical Exam  General:  Well-developed,well-nourished,in no acute distress; alert,appropriate and cooperative throughout examination Eyes:  No corneal or conjunctival inflammation noted. EOMI. Perrla. Funduscopic exam benign, without hemorrhages, exudates or papilledema. Vision grossly normal. Ears:  External ear exam shows no significant lesions or deformities.  Otoscopic examination reveals clear canals, tympanic membranes are intact bilaterally without bulging, retraction, inflammation or discharge. Hearing is grossly normal bilaterally. Nose:  External nasal examination shows no deformity or inflammation. Nasal mucosa are pink and moist without lesions or exudates. Mouth:  Oral mucosa and oropharynx without lesions or exudates.  Teeth in good repair. Neck:  no carotid bruit or thyromegaly no cervical or supraclavicular lymphadenopathy  Lungs:  Normal respiratory effort, chest expands symmetrically. Lungs are clear to auscultation, no crackles or wheezes. Heart:  Normal rate and regular rhythm. S1 and S2 normal without gallop, murmur, click, rub or other extra sounds. Abdomen:  Bowel sounds positive,abdomen soft and non-tender without masses, organomegaly or hernias noted. Genitalia:  Pelvic Exam:        External: normal female genitalia without lesions or masses        Vagina: normal without lesions or masses        Cervix: normal without lesions or masses        Adnexa: normal bimanual exam without masses or fullness        Uterus: normal by palpation        Pap smear: not performed Pulses:   R and L posterior tibial pulses are full and equal bilaterally  Extremities:  no edema  Skin:  Intact without suspicious lesions or rashes Psych:  Cognition and judgment appear intact. Alert and cooperative with normal attention span and concentration. No apparent delusions, illusions, hallucinations   Impression & Recommendations:  Problem # 1:  WELL WOMAN (ICD-V70.0) The patient's preventative maintenance and recommended screening tests for an annual wellness exam were reviewed in full today. Brought up to date unless services declined.  Counselled on the importance of diet, exercise, and its role in overall health and mortality. The patient's FH and SH was reviewed, including their home life, tobacco status, and drug and alcohol status.     Problem # 2:  ROUTINE GYNECOLOGICAL EXAMINATION (ICD-V72.31) DVE no pap. PAP q2-3 years.   Complete Medication List: 1)  Indapamide 1.25 Mg Tabs (Indapamide) .... Take 1 tablet by mouth every morning 2)  Potassium Citrate Gran (Potassium citrate) .... 5 micrograms.  two times a day 3)  Vitamin D 1000 Unit Tabs (Cholecalciferol) .... Take 1 tablet by mouth every morning 4)  Glucosamine-chondroitin Caps (Glucosamine-chondroit-vit c-mn) .... Take 1 tablet by mouth every morning 5)  Vytorin 10-40 Mg Tabs (Ezetimibe-simvastatin) .Marland Kitchen.. 1 tab by mouth daily 6)  Gabapentin 300 Mg Caps (Gabapentin) .... Take one  tablet by mouth once daily as needed 7)  Omeprazole 40 Mg Cpdr (Omeprazole) .Marland Kitchen.. 1 tab by mouth daily x 4-6 weeks then if symptoms resolved taper off for reflux 8)  Meloxicam 15 Mg Tabs (Meloxicam) .... One tablet by mouth at bedtime 9)  Treximet 85-500 Mg Tabs (Sumatriptan-naproxen sodium) .... Prn 10)  Estrace 0.1 Mg/gm Crea (Estradiol) .... Twice weekly vaginally 11)  Clotrimazole-betamethasone 1-0.05 % Crea (Clotrimazole-betamethasone) .... Use as needed for vaginal itching 12)  Cosamin Ds 500-400 Mg Caps (Glucosamine-chondroitin) .... Take  one tablet by mouth every morning  Lipid Assessment/Plan:      Based on NCEP/ATP III, the patient's risk factor category is "0-1 risk factors".  The patient's lipid goals are as follows: Total cholesterol goal is 200; LDL cholesterol goal is 160; HDL cholesterol goal is 40; Triglyceride goal is 150.    Patient Instructions: 1)  When due for mammogram..make sure to schedule bone density as well.  2)  Please schedule a follow-up appointment in 1 year.  3)  Lipids, AST, ALT in 6 months Dx 272.0  Current Allergies (reviewed today): ! PENICILLIN ! CODEINE ! TALWIN   Past Medical History:    Reviewed history from 07/05/2008 and no changes required:       Osteoarthritis, B knees, left hip, right elbow       GERD       Hyperlipidemia  Past Surgical History:    Reviewed history from 09/03/2008 and no changes required:       BTL 1980s       2000 breast bx : benign       Tonsillectomy       Hysterectomy, partial B ovaries remain, vaginal: for mennorhagia       lithotripsy, stent placed B 08/12/2008          Last PAP:  NEGATIVE FOR INTRAEPITHELIAL LESIONS OR MALIGNANCY. (09/03/2008 12:00:00 AM) PAP Next Due:  2 yr

## 2010-07-09 NOTE — Letter (Signed)
Summary: [facility]  [facility]   Imported By: Lanelle Bal 12/23/2009 12:42:55  _____________________________________________________________________  External Attachment:    Type:   Image     Comment:   External Document

## 2010-07-15 NOTE — Progress Notes (Signed)
----   Converted from flag ---- ---- 07/06/2010 5:09 PM, Kerby Nora MD wrote: CMET, lipids Dx 272.0  ---- 07/06/2010 4:12 PM, Melody Comas wrote: Patient is coming in for labs tomorrow. What labs to order and what diagnosis please. ------------------------------

## 2010-07-23 NOTE — Letter (Signed)
Summary: Historic Patient File  Historic Patient File   Imported By: Kassie Mends 07/14/2010 10:21:37  _____________________________________________________________________  External Attachment:    Type:   Image     Comment:   External Document

## 2010-09-17 LAB — POCT HEMOGLOBIN-HEMACUE: Hemoglobin: 16.3 g/dL — ABNORMAL HIGH (ref 12.0–15.0)

## 2010-10-20 NOTE — Op Note (Signed)
NAME:  Debra Leach, Debra Leach              ACCOUNT NO.:  [id number]   MEDICAL RECORD NO.:  [id number]          PATIENT TYPE:  AMB   LOCATION:  [facility]                         FACILITY:  [facility]   PHYSICIAN:  Heloise Purpura, MD      DATE OF BIRTH:  [birth year]   DATE OF PROCEDURE:  08/12/2008  DATE OF DISCHARGE:                               OPERATIVE REPORT   PREOPERATIVE DIAGNOSIS:  Bilateral renal calculi.   POSTOPERATIVE DIAGNOSIS:  Bilateral renal calculi.   PROCEDURE:  1. Cystoscopy.  2. Bilateral retrograde pyelography.  3. Bilateral ureteroscopy with laser lithotripsy.  4. Bilateral ureteral stent placement (6 x 24).   SURGEON:  Dr. Heloise Purpura.   ASSISTANT:  Dr. Georgeanna Lea.   ANESTHESIA:  General.   COMPLICATIONS:  None.   ESTIMATED BLOOD LOSS:  Minimal.   INDICATIONS:  Debra Leach is a 60 year old with a history of  nephrolithiasis.  She has known bilateral renal calculi with  approximately 11 stones seen in the right kidney and 17 in the left  kidney.  The stones were nonobstructing.  However, she has had  persistent bilateral flank pain with now an acute exacerbation of her  right-sided flank pain.  She has also had intermittent gross hematuria  and has been evaluated for malignancy which has been negative.  Based on  her persistent pain and stone burden, she elected to proceed with  ureteroscopic laser lithotripsy to see if this would potentially improve  her pain symptoms.  It was discussed beforehand that her symptoms may or  may not improve.  The potential risks, complications, and alternative  treatment options were discussed in detail and informed consent was  obtained.   DESCRIPTION OF PROCEDURE:  The patient was taken to the operating room  and a general anesthetic was administered.  She was given preoperative  antibiotics, placed in the dorsal lithotomy position, and prepped and  draped in the usual sterile fashion.  Next, a preoperative time-out was  performed.  Cystourethroscopy was then performed which demonstrated a  normal urethra and bladder without evidence of any stones, tumors, or  other mucosal pathology.  The ureteral orifices were in their normal  anatomic position and were seen to be effluxing clear urine.  The right  ureteral orifice was identified and was intubated with a 6-French  ureteral catheter.  Omnipaque contrast was injected which demonstrated  normal caliber ureter and renal pelvis without evidence of ureteral  filling defects.  A 0.038 Sensor guidewire was then advanced up into the  right renal pelvis.  A 12/14 ureteral access sheath was then advanced  over the wire.  The digital flexible ureteroscope was then advanced up  into the right renal pelvis and the entire renal collecting system was  examined.  The patient was noted to have numerous small calculi with  many attached to the papilla.  The 200 micron holmium laser fiber was  then used to fragment the stones and to remove them from the papillary  tips.  Once all stones were fragmented, they floated on the cystoscope  sheath and a 6 x 24 double-J  ureteral stent was advanced over the wire  using Seldinger technique with a good curl noted in the renal pelvis as  well as in the bladder.  Attention then turned to the left ureteral  orifice which was also intubated with a 6-French ureteral catheter and  Omnipaque contrast was injected.  Again no abnormalities or filling  defects were seen within the ureter.  A 0.038 Sensor guidewire was then  advanced up into the renal pelvis and a 12/14 ureteral access sheath was  again advanced over the wire without difficulty and positioned in the  proximal ureter.  The digital flexible ureteroscope was then used to  inspect the left renal collecting system.  There was noted to be a  larger stone burden on this side with a few stones measuring  approximately 3-4 mm.  All stones were then fragmented and again removed  from the  papillary tips.  Sizable fragments were extracted with a  nitinol basket and all remaining fragments were fragmented with the 200  micron holmium laser fiber adequately.  The guidewire was then replaced  into the renal pelvis and the ureteral access sheath was removed.  Again  the wire was back loaded over the cystoscope and a 6 x 24 double-J  ureteral stent was placed using Seldinger technique and the stent was  appropriately positioned under fluoroscopic and cystoscopic guidance.  The wire was then removed with good curl noted in the renal pelvis as  well as in the bladder.  The patient's bladder was then emptied and the  procedure was ended.  She tolerated the procedure well and without  complications.  She was able to be awakened and transferred to recovery  unit in satisfactory condition.      Heloise Purpura, MD  Electronically Signed     LB/MEDQ  D:  08/12/2008  T:  08/13/2008  Job:  [id number]

## 2010-11-17 ENCOUNTER — Other Ambulatory Visit: Payer: Self-pay | Admitting: Family Medicine

## 2010-11-17 DIAGNOSIS — Z1231 Encounter for screening mammogram for malignant neoplasm of breast: Secondary | ICD-10-CM

## 2010-12-31 ENCOUNTER — Telehealth: Payer: Self-pay | Admitting: Family Medicine

## 2010-12-31 DIAGNOSIS — M899 Disorder of bone, unspecified: Secondary | ICD-10-CM

## 2010-12-31 DIAGNOSIS — M949 Disorder of cartilage, unspecified: Secondary | ICD-10-CM

## 2010-12-31 DIAGNOSIS — E785 Hyperlipidemia, unspecified: Secondary | ICD-10-CM

## 2010-12-31 NOTE — Telephone Encounter (Signed)
Message copied by Excell Seltzer on Thu Dec 31, 2010  2:10 PM ------      Message from: Alvina Chou      Created: Tue Dec 29, 2010 12:10 PM       Patient is scheduled for Monday CPX labs, please order future labs, Thanks , Camelia Eng

## 2011-01-01 ENCOUNTER — Ambulatory Visit
Admission: RE | Admit: 2011-01-01 | Discharge: 2011-01-01 | Disposition: A | Discharge status: Home / Self Care | Payer: 59 | Source: Ambulatory Visit | Attending: Family Medicine | Admitting: Family Medicine

## 2011-01-01 DIAGNOSIS — Z1231 Encounter for screening mammogram for malignant neoplasm of breast: Secondary | ICD-10-CM

## 2011-01-04 ENCOUNTER — Other Ambulatory Visit (INDEPENDENT_AMBULATORY_CARE_PROVIDER_SITE_OTHER): Payer: 59

## 2011-01-04 DIAGNOSIS — M949 Disorder of cartilage, unspecified: Secondary | ICD-10-CM

## 2011-01-04 DIAGNOSIS — M899 Disorder of bone, unspecified: Secondary | ICD-10-CM

## 2011-01-04 DIAGNOSIS — E785 Hyperlipidemia, unspecified: Secondary | ICD-10-CM

## 2011-01-04 LAB — COMPREHENSIVE METABOLIC PANEL
ALT: 26 U/L (ref 0–35)
AST: 23 U/L (ref 0–37)
Albumin: 3.8 g/dL (ref 3.5–5.2)
Alkaline Phosphatase: 104 U/L (ref 39–117)
BUN: 17 mg/dL (ref 6–23)
CO2: 32 mEq/L (ref 19–32)
Calcium: 9.6 mg/dL (ref 8.4–10.5)
Chloride: 104 mEq/L (ref 96–112)
Creatinine, Ser: 0.7 mg/dL (ref 0.4–1.2)
GFR: 92.34 mL/min (ref >60.00)
Glucose, Bld: 110 mg/dL — ABNORMAL HIGH (ref 70–99)
Potassium: 4 mEq/L (ref 3.5–5.1)
Sodium: 144 mEq/L (ref 135–145)
Total Bilirubin: 0.6 mg/dL (ref 0.3–1.2)
Total Protein: 7.6 g/dL (ref 6.0–8.3)

## 2011-01-04 LAB — LIPID PANEL
Cholesterol: 212 mg/dL — ABNORMAL HIGH (ref 0–200)
HDL: 55.1 mg/dL (ref >39.00)
Total CHOL/HDL Ratio: 4
Triglycerides: 189 mg/dL — ABNORMAL HIGH (ref 0.0–149.0)
VLDL: 37.8 mg/dL (ref 0.0–40.0)

## 2011-01-04 LAB — LDL CHOLESTEROL, DIRECT: Direct LDL: 138.5 mg/dL

## 2011-01-05 LAB — VITAMIN D 25 HYDROXY (VIT D DEFICIENCY, FRACTURES): Vit D, 25-Hydroxy: 87 ng/mL (ref 30–89)

## 2011-01-06 ENCOUNTER — Encounter: Payer: Self-pay | Admitting: Family Medicine

## 2011-01-08 ENCOUNTER — Other Ambulatory Visit (HOSPITAL_COMMUNITY)
Admission: RE | Admit: 2011-01-08 | Discharge: 2011-01-08 | Disposition: A | Discharge status: Home / Self Care | Payer: 59 | Source: Ambulatory Visit | Attending: Family Medicine | Admitting: Family Medicine

## 2011-01-08 ENCOUNTER — Ambulatory Visit (INDEPENDENT_AMBULATORY_CARE_PROVIDER_SITE_OTHER): Payer: 59 | Admitting: Family Medicine

## 2011-01-08 ENCOUNTER — Encounter: Payer: Self-pay | Admitting: Family Medicine

## 2011-01-08 ENCOUNTER — Other Ambulatory Visit: Payer: Self-pay | Admitting: Family Medicine

## 2011-01-08 ENCOUNTER — Ambulatory Visit (INDEPENDENT_AMBULATORY_CARE_PROVIDER_SITE_OTHER)
Admission: RE | Admit: 2011-01-08 | Discharge: 2011-01-08 | Disposition: A | Discharge status: Home / Self Care | Payer: 59 | Source: Ambulatory Visit | Attending: Family Medicine | Admitting: Family Medicine

## 2011-01-08 DIAGNOSIS — Z1159 Encounter for screening for other viral diseases: Secondary | ICD-10-CM | POA: Insufficient documentation

## 2011-01-08 DIAGNOSIS — Z01419 Encounter for gynecological examination (general) (routine) without abnormal findings: Secondary | ICD-10-CM

## 2011-01-08 DIAGNOSIS — M25552 Pain in left hip: Secondary | ICD-10-CM

## 2011-01-08 DIAGNOSIS — M79609 Pain in unspecified limb: Secondary | ICD-10-CM

## 2011-01-08 DIAGNOSIS — R1032 Left lower quadrant pain: Secondary | ICD-10-CM | POA: Insufficient documentation

## 2011-01-08 DIAGNOSIS — E119 Type 2 diabetes mellitus without complications: Secondary | ICD-10-CM | POA: Insufficient documentation

## 2011-01-08 DIAGNOSIS — M899 Disorder of bone, unspecified: Secondary | ICD-10-CM

## 2011-01-08 DIAGNOSIS — M949 Disorder of cartilage, unspecified: Secondary | ICD-10-CM

## 2011-01-08 DIAGNOSIS — Z0000 Encounter for general adult medical examination without abnormal findings: Secondary | ICD-10-CM

## 2011-01-08 DIAGNOSIS — M79672 Pain in left foot: Secondary | ICD-10-CM | POA: Insufficient documentation

## 2011-01-08 DIAGNOSIS — R7303 Prediabetes: Secondary | ICD-10-CM

## 2011-01-08 DIAGNOSIS — R7309 Other abnormal glucose: Secondary | ICD-10-CM

## 2011-01-08 DIAGNOSIS — M25559 Pain in unspecified hip: Secondary | ICD-10-CM

## 2011-01-08 DIAGNOSIS — E1165 Type 2 diabetes mellitus with hyperglycemia: Secondary | ICD-10-CM | POA: Insufficient documentation

## 2011-01-08 DIAGNOSIS — E785 Hyperlipidemia, unspecified: Secondary | ICD-10-CM

## 2011-01-08 MED ORDER — ATORVASTATIN CALCIUM 80 MG PO TABS: 80.0000 mg | ORAL_TABLET | Freq: Every day | ORAL | Status: DC

## 2011-01-08 NOTE — Progress Notes (Signed)
Subjective:    Patient ID: Debra Leach, female    DOB: May 23, 1951, 60 y.o.   MRN: [id number]  HPI The patient is here for annual wellness exam and preventative care.    Elevated Cholesterol: On atorvastain 80 mg daily. Using medications without problems: no  Muscle aches: No Other complaints: BP well controlled.   Prediabetes, some better than 6 months ago.  Pain in left lateral foot in past 4 months. No known injury. Feels knot in painful area. Mild redness intermittantly, no swelling. Pain with sitting or standing.  Planning on seeing  Dr. Brynda Greathouse for left hip pain..negative X-rays in past. Feels left hip locks up and sharp pain laterally.   No current exercise.  LLQ abdominal pain: ongoing for years. No relation ship to BMs, no constipation, diarrhea, comes and goes.  Has ovaries, no uterus      Review of Systems  Constitutional: Negative for fever and fatigue.  HENT: Negative for ear pain.   Eyes: Negative for pain.  Respiratory: Negative for chest tightness and shortness of breath.   Cardiovascular: Negative for chest pain, palpitations and leg swelling.  Gastrointestinal: Negative for abdominal pain.  Genitourinary: Negative for dysuria.       Objective:   Physical Exam  Constitutional: Vital signs are normal. She appears well-developed and well-nourished. She is cooperative.  Non-toxic appearance. She does not appear ill. No distress.  HENT:  Head: Normocephalic.  Right Ear: Hearing, tympanic membrane, external ear and ear canal normal.  Left Ear: Hearing, tympanic membrane, external ear and ear canal normal.  Nose: Nose normal.  Eyes: Conjunctivae, EOM and lids are normal. Pupils are equal, round, and reactive to light. No foreign bodies found.  Neck: Trachea normal and normal range of motion. Neck supple. Carotid bruit is not present. No mass and no thyromegaly present.  Cardiovascular: Normal rate, regular rhythm, S1 normal, S2 normal, normal  heart sounds and intact distal pulses.  Exam reveals no gallop.   No murmur heard. Pulmonary/Chest: Effort normal and breath sounds normal. No respiratory distress. She has no wheezes. She has no rhonchi. She has no rales.  Abdominal: Soft. Normal appearance and bowel sounds are normal. She exhibits no distension, no fluid wave, no abdominal bruit and no mass. There is no hepatosplenomegaly. There is tenderness in the left lower quadrant. There is no rebound, no guarding and no CVA tenderness. No hernia.  Genitourinary: Vagina normal and uterus normal. No breast swelling, tenderness, discharge or bleeding. Pelvic exam was performed with patient prone. There is no rash, tenderness or lesion on the right labia. There is no rash, tenderness or lesion on the left labia. Uterus is not enlarged and not tender. Cervix exhibits no motion tenderness, no discharge and no friability. Right adnexum displays no mass, no tenderness and no fullness. Left adnexum displays no mass, no tenderness and no fullness.  Musculoskeletal:       Left hip: She exhibits tenderness. She exhibits normal range of motion and normal strength.       Left foot: She exhibits bony tenderness. She exhibits normal range of motion and no swelling.       ttp over left hip bursa.   ttp at base of fifth metatarsal, no deformity, no mass   Lymphadenopathy:    She has no cervical adenopathy.    She has no axillary adenopathy.  Neurological: She is alert. She has normal strength. No cranial nerve deficit or sensory deficit.  Skin: Skin is warm, dry and  intact. No rash noted.  Psychiatric: Her speech is normal and behavior is normal. Judgment normal. Her mood appears not anxious. Cognition and memory are normal. She does not exhibit a depressed mood.          Assessment & Plan:  Complete Physical Exam: The patient's preventative maintenance and recommended screening tests for an annual wellness exam were reviewed in full today. Brought  up to date unless services declined.  Counselled on the importance of diet, exercise, and its role in overall health and mortality. The patient's FH and SH was reviewed, including their home life, tobacco status, and drug and alcohol status.   Colonoscopy due this year. Nml mamm. PAP this year then every 3 years, DVE yearly.  Shingles after age 4. Td UP to date.  LAst DEXA 2011, stable, nml vit D.

## 2011-01-08 NOTE — Assessment & Plan Note (Addendum)
Counseled on Lifestyle changes.

## 2011-01-08 NOTE — Assessment & Plan Note (Signed)
Unclear cause.. Given no associated symptoms ? Functional pain associated with adhesions.  Given location eval ovaries with [place].

## 2011-01-08 NOTE — Assessment & Plan Note (Signed)
High trigs.. Start exercsie, diet changes and fish oil.  LDL almost at goal on lipitor 80... beter than 6 months ago.

## 2011-01-08 NOTE — Patient Instructions (Addendum)
Start exercise back regularly. Fish oil/flax seed oil: 2000 mg divided daily. Lower carbohydrates in diet.  Colonoscopy this year with Dr. Kinnie Scales.  For hip start exercises as given, start naproxyn for pain. If not improving follow up for steroid injection with Dr. Brynda Greathouse. For foot: We will call with X-ray results. For left lower quadrant pain: We will call with ultrasound reults.

## 2011-01-08 NOTE — Assessment & Plan Note (Signed)
Likely bursitits.. Start with stretching (info given) and meloxicam 15 mg daily. Follow up with Dr. Brynda Greathouse or here with Dr. Patsy Lager for steroid injection if not improving.

## 2011-01-08 NOTE — Assessment & Plan Note (Signed)
Given location.. eval with X-ray.

## 2011-01-11 ENCOUNTER — Ambulatory Visit
Admission: RE | Admit: 2011-01-11 | Discharge: 2011-01-11 | Disposition: A | Discharge status: Home / Self Care | Payer: 59 | Source: Ambulatory Visit | Attending: Family Medicine | Admitting: Family Medicine

## 2011-01-11 DIAGNOSIS — R1032 Left lower quadrant pain: Secondary | ICD-10-CM

## 2011-01-15 ENCOUNTER — Encounter: Payer: Self-pay | Admitting: *Deleted

## 2011-03-08 ENCOUNTER — Other Ambulatory Visit: Payer: Self-pay | Admitting: *Deleted

## 2011-03-08 MED ORDER — MELOXICAM 15 MG PO TABS: 15.0000 mg | ORAL_TABLET | Freq: Every day | ORAL | Status: DC

## 2011-03-08 NOTE — Telephone Encounter (Signed)
Received faxed refill request, medication was d/c off of med list on 01/08/2011, please advise.

## 2011-04-05 ENCOUNTER — Other Ambulatory Visit: Payer: Self-pay | Admitting: *Deleted

## 2011-04-05 MED ORDER — MELOXICAM 15 MG PO TABS: 15.0000 mg | ORAL_TABLET | Freq: Every day | ORAL | Status: DC

## 2011-04-20 ENCOUNTER — Telehealth: Payer: Self-pay | Admitting: *Deleted

## 2011-04-20 ENCOUNTER — Ambulatory Visit (INDEPENDENT_AMBULATORY_CARE_PROVIDER_SITE_OTHER): Payer: 59 | Admitting: Family Medicine

## 2011-04-20 ENCOUNTER — Encounter: Payer: Self-pay | Admitting: Family Medicine

## 2011-04-20 VITALS — BP 120/74 | HR 83 | Temp 98.0°F | Ht 60.0 in | Wt 150.8 lb

## 2011-04-20 DIAGNOSIS — R079 Chest pain, unspecified: Secondary | ICD-10-CM

## 2011-04-20 LAB — CARDIAC PANEL
CK-MB: 2.6 ng/mL (ref 0.3–4.0)
Relative Index: 1.9 calc (ref 0.0–2.5)
Total CK: 136 U/L (ref 7–177)

## 2011-04-20 NOTE — Patient Instructions (Addendum)
04/22/2011  3:15 PM Dr. Mariah Milling in [facility]

## 2011-04-20 NOTE — Telephone Encounter (Signed)
Patient called stating that she has had chest pressure off and on for 2 days. Patient states that she feels chest pressure at times and it feels like something is squeezing her chest. Patient states that she has not had any other symptoms. Patient states that she had a stress test that was normal about 10 years ago for the same type symptoms. Patient states that she has a hair appointment at 9:30 and would like to see someone after that time. Spoke to Dr. Patsy Lager and appointment was scheduled for today at 12:30. Patient was advised that if symptoms get worse that she should call 911 and patient agreed.

## 2011-04-20 NOTE — Progress Notes (Signed)
Subjective:    Patient ID: Debra Leach, female    DOB: 05-29-1951, 60 y.o.   MRN: [id number]  HPI  Debra Leach, a 60 y.o. female presents today in the office for the following:    Pleasant 60 year old with Chest pain, hyperlipidemia, 150 lbs., FH MI and CAD with no other cardiac risk factors  Tightness in her chest, a fist squeezing. Something is sitting really heavy there. Several days ago, and more pronounced today, and more. No trauma or exertion. Has not kept her awake at night. Notices and worse with up and moving around.   No trauma. No injury. No pain with taking a deep breath. Feels very different from her prior reflux pain. No anxiety.  4-5/10  Uncomfortable.  FHx: Cousins, Aunts: multiple fairly early.  1 cousin in the early 12's Aunts - did not live past 60's from Cardiac. At least 2 with MI's.  A couple other - I do not know.  Smoked, quit 20 years ago.  About 10-15 pack year history  No history  Feels different from indigestion  Patient Active Problem List  Diagnoses  . HYPERLIPIDEMIA  . INSOMNIA, CHRONIC  . COMMON MIGRAINE  . GERD  . OSTEOARTHRITIS  . OSTEOPENIA  . MITRAL VALVE PROLAPSE, HX OF  . NEPHROLITHIASIS, HX OF  . Pre-diabetes  . Left lower quadrant pain, chrinic intermittant  . Left hip pain  . Left foot pain   Past Medical History  Diagnosis Date  . Arthritis     osteoarthritis B knees, left hip , right elbow  . Hyperlipidemia   . GERD (gastroesophageal reflux disease)    Past Surgical History  Procedure Date  . Tubal ligation 1980  . Breast surgery 2000    breast biopsy (benign)  . Partial hysterectomy     Both ovaries remain, vaginal, for mennorhagia  . Tonsillectomy   . Lithotripsy 08-12-2008    stent placed bilaterally   History  Substance Use Topics  . Smoking status: Never Smoker   . Smokeless tobacco: Not on file  . Alcohol Use: Yes   Family History  Problem Relation Age of Onset  . Cancer Mother     bone   . Hypertension Father   . Asthma Brother   . Arthritis Brother   . Nephrolithiasis Brother   . Nephrolithiasis Brother   . Asthma Brother   . Arthritis Brother    Allergies  Allergen Reactions  . Codeine     REACTION: Migraine  . Penicillins     REACTION: Itching  . Pentazocine Lactate     REACTION: Swelling, itching, rash   Current Outpatient Prescriptions on File Prior to Visit  Medication Sig Dispense Refill  . atorvastatin (LIPITOR) 80 MG tablet Take 1 tablet (80 mg total) by mouth daily.  90 tablet  3  . cholecalciferol (VITAMIN D) 1000 UNITS tablet Take 1,000 Units by mouth daily.        . clotrimazole-betamethasone (LOTRISONE) cream Apply 1 application topically as needed.        Marland Kitchen estradiol (ESTRACE) 0.1 MG/GM vaginal cream Place 2 g vaginally 2 (two) times a week.        . gabapentin (NEURONTIN) 300 MG capsule Take 600 mg by mouth. At bedtime and as needed      . glucosamine-chondroitin (COSAMIN DS) 500-400 MG tablet Take 1 tablet by mouth every morning.        . indapamide (LOZOL) 1.25 MG tablet Take 1.25 mg by  mouth every morning.        . meloxicam (MOBIC) 15 MG tablet Take 1 tablet (15 mg total) by mouth at bedtime.  30 tablet  0  . omeprazole (PRILOSEC) 40 MG capsule Take 40 mg by mouth daily.        . potassium citrate (UROCIT-K) 10 MEQ (1080 MG) SR tablet       . SUMAtriptan-naproxen (TREXIMET) 85-500 MG per tablet Take 1 tablet by mouth every 2 (two) hours as needed.           Review of Systems ROS: GEN: No acute illnesses, no fevers, chills. GI: No n/v/d, eating normally Pulm: No SOB Interactive and getting along well at home.  Otherwise, ROS is as per the HPI.     Objective:   Physical Exam   Physical Exam  Blood pressure 120/74, pulse 83, temperature 98 F (36.7 C), temperature source Oral, height 5' (1.524 m), weight 150 lb 12.8 oz (68.402 kg), SpO2 99.00%.  GEN: WDWN, NAD, Non-toxic, A & O x 3 HEENT: Atraumatic, Normocephalic. Neck supple. No  masses, No LAD. Ears and Nose: No external deformity. CV: RRR, No M/G/R. No JVD. No thrill. No extra heart sounds. PULM: CTA B, no wheezes, crackles, rhonchi. No retractions. No resp. distress. No accessory muscle use.  EXTR: No c/c/e NEURO Normal gait.  PSYCH: Normally interactive. Conversant. Not depressed or anxious appearing.  Calm demeanor.        Assessment & Plan:   1. Chest pain at rest  Ambulatory referral to Cardiology, Cardiac panel    The patient appears at rest and comfortable, but she is having substernal chest pain. 4/10.  I have made her a followup cardiology appointment in 2 days. I'm also going to check stat cardiac enzymes right now. She's been having symptoms for several days, so these are negative, it is highly reassuring. If they're positive, then I am going to instruct her to go emergently to the hospital.  EKG: Normal sinus rhythm. Normal rate. There does appear to be an r1 r-prime, and there is some flattening of the T waves in aVL and aVF. No acute ST elevation. No acute ST depression. the QRS complex appears to have minimal amplitude in aVL and aVF. I modified the gain on the computer, and does not appear to be q's  She knows to go to ER if symptoms worsen

## 2011-04-22 ENCOUNTER — Ambulatory Visit (INDEPENDENT_AMBULATORY_CARE_PROVIDER_SITE_OTHER): Payer: 59 | Admitting: Cardiovascular Disease

## 2011-04-22 ENCOUNTER — Encounter: Payer: Self-pay | Admitting: Cardiovascular Disease

## 2011-04-22 DIAGNOSIS — R0789 Other chest pain: Secondary | ICD-10-CM

## 2011-04-22 DIAGNOSIS — E785 Hyperlipidemia, unspecified: Secondary | ICD-10-CM

## 2011-04-22 DIAGNOSIS — Z8679 Personal history of other diseases of the circulatory system: Secondary | ICD-10-CM

## 2011-04-22 DIAGNOSIS — R7303 Prediabetes: Secondary | ICD-10-CM

## 2011-04-22 DIAGNOSIS — R079 Chest pain, unspecified: Secondary | ICD-10-CM

## 2011-04-22 DIAGNOSIS — R0602 Shortness of breath: Secondary | ICD-10-CM

## 2011-04-22 DIAGNOSIS — K219 Gastro-esophageal reflux disease without esophagitis: Secondary | ICD-10-CM

## 2011-04-22 MED ORDER — EZETIMIBE 10 MG PO TABS: 10.0000 mg | ORAL_TABLET | Freq: Every day | ORAL | Status: DC

## 2011-04-22 NOTE — Progress Notes (Signed)
Patient ID: TEDDY PENA, female    DOB: 1950/12/25, 60 y.o.   MRN: [id number]  HPI Comments: Ms. Rieke is a very pleasant 60 year old woman with a remote history of smoking through her 59s, history of mitral valve prolapse though never visualized on echocardiogram, some family history of coronary artery disease on her mother's side below her mother died of cancer in her 30s, who presents via referral for evaluation of chest pain.  She reports that over the past week, she has had symptoms of a grabbing in her chest, also described as a pressure or weight on her sternum. Sometimes it comes as a sharp cramp. Symptoms can last for hours at a time, sometimes all day into the next day. They are not associated with exertion. She has been relatively active. Typically walks 4 miles at their holiday house.  She has not been taking omeprazole. She does take meloxicam on a regular basis for arthritic pain. She denies any shortness of breath, lightheadedness, dizziness, lower extremity edema.  EKG shows normal sinus rhythm with rate 91 beats per minute, no significant ST or T wave changes   Outpatient Encounter Prescriptions as of 04/22/2011  Medication Sig Dispense Refill  . atorvastatin (LIPITOR) 80 MG tablet Take 1 tablet (80 mg total) by mouth daily.  90 tablet  3  . cholecalciferol (VITAMIN D) 1000 UNITS tablet Take 1,000 Units by mouth daily.        . clotrimazole-betamethasone (LOTRISONE) cream Apply 1 application topically as needed.        Marland Kitchen estradiol (ESTRACE) 0.1 MG/GM vaginal cream Place 2 g vaginally 2 (two) times a week.        . gabapentin (NEURONTIN) 300 MG capsule Take 600 mg by mouth. At bedtime and as needed      . glucosamine-chondroitin (COSAMIN DS) 500-400 MG tablet Take 1 tablet by mouth every morning.        . indapamide (LOZOL) 1.25 MG tablet Take 1.25 mg by mouth every morning.        . meloxicam (MOBIC) 15 MG tablet Take 1 tablet (15 mg total) by mouth at bedtime.  30  tablet  0  . omeprazole (PRILOSEC) 40 MG capsule Take 40 mg by mouth daily.  PRN      . SUMAtriptan-naproxen (TREXIMET) 85-500 MG per tablet Take 1 tablet by mouth every 2 (two) hours as needed.        Marland Kitchen DISCONTD: potassium citrate (UROCIT-K) 10 MEQ (1080 MG) SR tablet         Review of Systems  Constitutional: Negative.   HENT: Negative.   Eyes: Negative.   Respiratory: Negative.   Cardiovascular: Positive for chest pain.  Gastrointestinal: Negative.   Musculoskeletal: Negative.   Skin: Negative.   Neurological: Negative.   Hematological: Negative.   Psychiatric/Behavioral: Negative.   All other systems reviewed and are negative.    BP 122/80  Pulse 91  Ht 5' (1.524 m)  Wt 150 lb 8 oz (68.266 kg)  BMI 29.39 kg/m2  Physical Exam  Nursing note and vitals reviewed. Constitutional: She is oriented to person, place, and time. She appears well-developed and well-nourished.  HENT:  Head: Normocephalic.  Nose: Nose normal.  Mouth/Throat: Oropharynx is clear and moist.  Eyes: Conjunctivae are normal. Pupils are equal, round, and reactive to light.  Neck: Normal range of motion. Neck supple. No JVD present.  Cardiovascular: Normal rate, regular rhythm, S1 normal, S2 normal, normal heart sounds and intact distal pulses.  Exam reveals no gallop and no friction rub.   No murmur heard. Pulmonary/Chest: Effort normal and breath sounds normal. No respiratory distress. She has no wheezes. She has no rales. She exhibits no tenderness.  Abdominal: Soft. Bowel sounds are normal. She exhibits no distension. There is no tenderness.  Musculoskeletal: Normal range of motion. She exhibits no edema and no tenderness.  Lymphadenopathy:    She has no cervical adenopathy.  Neurological: She is alert and oriented to person, place, and time. Coordination normal.  Skin: Skin is warm and dry. No rash noted. No erythema.  Psychiatric: She has a normal mood and affect. Her behavior is normal. Judgment  and thought content normal.         Assessment and Plan

## 2011-04-22 NOTE — Assessment & Plan Note (Signed)
No murmur auscultated on exam. Mitral valve prolapse was never confirmed on echocardiogram. Uncertain if she actually has this diagnosis.

## 2011-04-22 NOTE — Assessment & Plan Note (Signed)
Symptoms are somewhat atypical, occurring at rest. No exacerbation of her symptoms with exertion. Also concerning for GI related etiology. We have suggested she stop her meloxicam, start omeprazole on a daily basis. We did offer a routine treadmill study. She would prefer to try exercising herself first. We have suggested if she is able to exercise without any exacerbation of her chest discomfort, this would be encouraging and we could probably continue to monitor her symptoms. If she has worsening symptoms with exertion, and asked her to contact me for a stress test.   We have suggested she start aspirin 81 mg daily.

## 2011-04-22 NOTE — Patient Instructions (Signed)
Please try zetia one a day for cholesterol  Please exercise, monitoring for chest pain If you have chest pain with exertion, call the office.  Please start omeprazole daily Hold meloxicam for now  Please call us if you have new issues that need to be addressed before your next appt.  The office will contact you for a follow up Appt. In 1 months

## 2011-04-22 NOTE — Assessment & Plan Note (Signed)
Very elevated cholesterol, managed reasonably well on high-dose Lipitor. Given her recent symptoms, we will push her cholesterol even lower and start zetia 10 mg daily. She has had problems in the past on Crestor.

## 2011-04-22 NOTE — Assessment & Plan Note (Signed)
We have encouraged continued exercise, careful diet management in an effort to lose weight. 

## 2011-04-22 NOTE — Assessment & Plan Note (Signed)
We have suggested she restart omeprazole 40 mg daily. Hold her meloxicam.

## 2011-05-25 ENCOUNTER — Ambulatory Visit: Payer: 59 | Admitting: Cardiovascular Disease

## 2011-05-27 ENCOUNTER — Ambulatory Visit (INDEPENDENT_AMBULATORY_CARE_PROVIDER_SITE_OTHER): Payer: 59

## 2011-05-27 DIAGNOSIS — Z23 Encounter for immunization: Secondary | ICD-10-CM

## 2011-05-27 DIAGNOSIS — E538 Deficiency of other specified B group vitamins: Secondary | ICD-10-CM

## 2011-06-09 ENCOUNTER — Other Ambulatory Visit: Payer: Self-pay | Admitting: *Deleted

## 2011-06-09 MED ORDER — MELOXICAM 15 MG PO TABS: 15.0000 mg | ORAL_TABLET | Freq: Every day | ORAL | Status: DC

## 2011-06-22 ENCOUNTER — Other Ambulatory Visit: Payer: Self-pay | Admitting: *Deleted

## 2011-06-22 MED ORDER — OMEPRAZOLE 40 MG PO CPDR: 40.0000 mg | DELAYED_RELEASE_CAPSULE | Freq: Every day | ORAL | Status: DC

## 2011-07-26 ENCOUNTER — Other Ambulatory Visit: Payer: Self-pay | Admitting: Orthopaedic Surgery

## 2011-07-26 DIAGNOSIS — M25552 Pain in left hip: Secondary | ICD-10-CM

## 2011-07-31 ENCOUNTER — Other Ambulatory Visit: Payer: 59

## 2011-08-02 ENCOUNTER — Ambulatory Visit
Admission: RE | Admit: 2011-08-02 | Discharge: 2011-08-02 | Disposition: A | Discharge status: Home / Self Care | Payer: 59 | Source: Ambulatory Visit | Attending: Orthopaedic Surgery | Admitting: Orthopaedic Surgery

## 2011-08-02 DIAGNOSIS — M25552 Pain in left hip: Secondary | ICD-10-CM

## 2011-12-06 ENCOUNTER — Other Ambulatory Visit: Payer: Self-pay | Admitting: Family Medicine

## 2011-12-06 DIAGNOSIS — Z1231 Encounter for screening mammogram for malignant neoplasm of breast: Secondary | ICD-10-CM

## 2012-01-03 ENCOUNTER — Other Ambulatory Visit: Payer: Self-pay | Admitting: *Deleted

## 2012-01-03 MED ORDER — ATORVASTATIN CALCIUM 80 MG PO TABS: 80.0000 mg | ORAL_TABLET | Freq: Every day | ORAL | Status: DC

## 2012-01-06 ENCOUNTER — Ambulatory Visit
Admission: RE | Admit: 2012-01-06 | Discharge: 2012-01-06 | Disposition: A | Discharge status: Home / Self Care | Payer: 59 | Source: Ambulatory Visit | Attending: Family Medicine | Admitting: Family Medicine

## 2012-01-06 DIAGNOSIS — Z1231 Encounter for screening mammogram for malignant neoplasm of breast: Secondary | ICD-10-CM

## 2012-01-27 ENCOUNTER — Telehealth: Payer: Self-pay | Admitting: Family Medicine

## 2012-01-27 ENCOUNTER — Other Ambulatory Visit (INDEPENDENT_AMBULATORY_CARE_PROVIDER_SITE_OTHER): Payer: 59

## 2012-01-27 DIAGNOSIS — R7303 Prediabetes: Secondary | ICD-10-CM

## 2012-01-27 DIAGNOSIS — E785 Hyperlipidemia, unspecified: Secondary | ICD-10-CM

## 2012-01-27 DIAGNOSIS — M899 Disorder of bone, unspecified: Secondary | ICD-10-CM

## 2012-01-27 DIAGNOSIS — M949 Disorder of cartilage, unspecified: Secondary | ICD-10-CM

## 2012-01-27 LAB — COMPREHENSIVE METABOLIC PANEL
ALT: 27 U/L (ref 0–35)
AST: 23 U/L (ref 0–37)
Albumin: 3.6 g/dL (ref 3.5–5.2)
Alkaline Phosphatase: 87 U/L (ref 39–117)
BUN: 21 mg/dL (ref 6–23)
CO2: 28 mEq/L (ref 19–32)
Calcium: 8.9 mg/dL (ref 8.4–10.5)
Chloride: 105 mEq/L (ref 96–112)
Creatinine, Ser: 0.7 mg/dL (ref 0.4–1.2)
GFR: 86.22 mL/min (ref >60.00)
Glucose, Bld: 106 mg/dL — ABNORMAL HIGH (ref 70–99)
Potassium: 4.3 mEq/L (ref 3.5–5.1)
Sodium: 139 mEq/L (ref 135–145)
Total Bilirubin: 0.6 mg/dL (ref 0.3–1.2)
Total Protein: 7.2 g/dL (ref 6.0–8.3)

## 2012-01-27 LAB — LIPID PANEL
Cholesterol: 203 mg/dL — ABNORMAL HIGH (ref 0–200)
HDL: 55.2 mg/dL (ref >39.00)
Total CHOL/HDL Ratio: 4
Triglycerides: 140 mg/dL (ref 0.0–149.0)
VLDL: 28 mg/dL (ref 0.0–40.0)

## 2012-01-27 LAB — LDL CHOLESTEROL, DIRECT: Direct LDL: 147.3 mg/dL

## 2012-01-27 NOTE — Telephone Encounter (Signed)
Message copied by Excell Seltzer on Thu Jan 27, 2012  1:13 AM ------      Message from: Alvina Chou      Created: Fri Jan 21, 2012  9:57 AM      Regarding: lab orders for Thursday, 8.22.13       Patient is scheduled for CPX labs, please order future labs, Thanks , Camelia Eng

## 2012-01-27 NOTE — Telephone Encounter (Signed)
Message copied by Excell Seltzer on Thu Jan 27, 2012  1:11 AM ------      Message from: [place], [place] J      Created: Thu Jan 20, 2012  4:52 PM      Regarding: lab orders for [facility], 8.22.13       Patient is scheduled for CPX labs, please order future labs, Thanks , Debra Leach

## 2012-01-28 LAB — VITAMIN D 25 HYDROXY (VIT D DEFICIENCY, FRACTURES): Vit D, 25-Hydroxy: 56 ng/mL (ref 30–89)

## 2012-02-08 ENCOUNTER — Other Ambulatory Visit: Payer: 59

## 2012-02-11 ENCOUNTER — Encounter: Payer: 59 | Admitting: Family Medicine

## 2012-02-15 ENCOUNTER — Ambulatory Visit (INDEPENDENT_AMBULATORY_CARE_PROVIDER_SITE_OTHER): Payer: 59 | Admitting: Family Medicine

## 2012-02-15 ENCOUNTER — Encounter: Payer: Self-pay | Admitting: Family Medicine

## 2012-02-15 VITALS — BP 120/78 | HR 65 | Temp 98.5°F | Resp 15 | Ht 61.0 in | Wt 156.5 lb

## 2012-02-15 DIAGNOSIS — M899 Disorder of bone, unspecified: Secondary | ICD-10-CM

## 2012-02-15 DIAGNOSIS — L989 Disorder of the skin and subcutaneous tissue, unspecified: Secondary | ICD-10-CM

## 2012-02-15 DIAGNOSIS — Z23 Encounter for immunization: Secondary | ICD-10-CM

## 2012-02-15 DIAGNOSIS — R7309 Other abnormal glucose: Secondary | ICD-10-CM

## 2012-02-15 DIAGNOSIS — H00019 Hordeolum externum unspecified eye, unspecified eyelid: Secondary | ICD-10-CM

## 2012-02-15 DIAGNOSIS — Z0000 Encounter for general adult medical examination without abnormal findings: Secondary | ICD-10-CM

## 2012-02-15 DIAGNOSIS — R7303 Prediabetes: Secondary | ICD-10-CM

## 2012-02-15 DIAGNOSIS — M949 Disorder of cartilage, unspecified: Secondary | ICD-10-CM

## 2012-02-15 DIAGNOSIS — E785 Hyperlipidemia, unspecified: Secondary | ICD-10-CM

## 2012-02-15 MED ORDER — CLOTRIMAZOLE-BETAMETHASONE 1-0.05 % EX CREA: 1.0000 "application " | TOPICAL_CREAM | CUTANEOUS | Status: DC | PRN

## 2012-02-15 MED ORDER — COLESEVELAM HCL 625 MG PO TABS: 1875.0000 mg | ORAL_TABLET | Freq: Two times a day (BID) | ORAL | Status: DC

## 2012-02-15 NOTE — Patient Instructions (Addendum)
Increase exercise as able. Stop zetia as it is not helping lower LDL. Add welchol to atorvastatin to lower cholesterol. Return for cholesterol recheck in 3 months. Stop at front desk to set up bone density and derm referral. Warm compresses to help with stye, call if redness spreading.

## 2012-02-15 NOTE — Progress Notes (Signed)
Subjective:    Patient ID: Debra Leach, female    DOB: 1951-03-23, 61 y.o.   MRN: [id number]  HPI  The patient is here for annual wellness exam and preventative care.   Elevated Cholesterol: On atorvastain 80 mg daily and zetia.  Lab Results  Component Value Date   CHOL 203* 01/27/2012   HDL 55.20 01/27/2012   LDLCALC 113* 09/03/2009   LDLDIRECT 147.3 01/27/2012   TRIG 140.0 01/27/2012   CHOLHDL 4 01/27/2012  Using medications without problems: no  Muscle aches: No  Other complaints:   BP well controlled.   Prediabetes, some better than last year.   Exercise: Walks at [employer] 6 days a week, spends most her time there.  Diet: Moderate.  Wants skin check.. Several lesions she is concerned about.  Swelling in right medial corner of eyelid. Noted in last few days. Overall eye itchy as well. Some fall allergy symptoms that she attributes these to.  Nml eye exam in last 10 days.         Review of Systems  Constitutional: Positive for fatigue. Negative for fever and unexpected weight change.       Associated with migraines  HENT: Negative for ear pain, congestion, sore throat, sneezing, trouble swallowing and sinus pressure.   Eyes: Negative for pain and itching.  Respiratory: Negative for cough, shortness of breath and wheezing.   Cardiovascular: Negative for chest pain, palpitations and leg swelling.       Chest pain last year ws due to reflux, better on omeprazole  Gastrointestinal: Negative for nausea, diarrhea, constipation and blood in stool.  Genitourinary: Negative for dysuria, hematuria, vaginal discharge, difficulty urinating and menstrual problem.  Skin: Negative for rash.  Neurological: Positive for headaches. Negative for syncope, weakness, light-headedness and numbness.       Migraines  Psychiatric/Behavioral: Negative for confusion and dysphoric mood. The patient is not nervous/anxious.        Objective:   Physical Exam  Constitutional: Vital signs are  normal. She appears well-developed and well-nourished. She is cooperative.  Non-toxic appearance. She does not appear ill. No distress.  HENT:  Head: Normocephalic.  Right Ear: Hearing, tympanic membrane, external ear and ear canal normal.  Left Ear: Hearing, tympanic membrane, external ear and ear canal normal.  Nose: Nose normal.  Eyes: Conjunctivae normal, EOM and lids are normal. Pupils are equal, round, and reactive to light. No foreign bodies found.       Stye right eye  Neck: Trachea normal and normal range of motion. Neck supple. Carotid bruit is not present. No mass and no thyromegaly present.  Cardiovascular: Normal rate, regular rhythm, S1 normal, S2 normal, normal heart sounds and intact distal pulses.  Exam reveals no gallop.   No murmur heard. Pulmonary/Chest: Effort normal and breath sounds normal. No respiratory distress. She has no wheezes. She has no rhonchi. She has no rales.  Abdominal: Soft. Normal appearance and bowel sounds are normal. She exhibits no distension, no fluid wave, no abdominal bruit and no mass. There is no hepatosplenomegaly. There is no tenderness. There is no rebound, no guarding and no CVA tenderness. No hernia.  Genitourinary: Vagina normal and uterus normal. No breast swelling, tenderness, discharge or bleeding. Pelvic exam was performed with patient supine. There is no tenderness or lesion on the right labia. There is no tenderness or lesion on the left labia. Uterus is not enlarged and not tender. Right adnexum displays no mass, no tenderness and no fullness. Left adnexum  displays no mass, no tenderness and no fullness.  Lymphadenopathy:    She has no cervical adenopathy.    She has no axillary adenopathy.  Neurological: She is alert. She has normal strength. No cranial nerve deficit or sensory deficit.  Skin: Skin is warm, dry and intact. No rash noted.  Psychiatric: Her speech is normal and behavior is normal. Judgment normal. Her mood appears not  anxious. Cognition and memory are normal. She does not exhibit a depressed mood.          Assessment & Plan:  The patient's preventative maintenance and recommended screening tests for an annual wellness exam were reviewed in full today. Brought up to date unless services declined.  Counselled on the importance of diet, exercise, and its role in overall health and mortality. The patient's FH and SH was reviewed, including their home life, tobacco status, and drug and alcohol status.   Colonoscopy was normal in 2007, no family history, no past polyps, asymptomatic. CORRECTION.. Due in 2017. PAP last 2012 then every 3 years, DVE yearly.  flu given, Td, Shingles, Uptodate.  Last DEXA 2011, stable, nml vit D. Due this year. Mammo: nml 12/2011.

## 2012-03-02 DIAGNOSIS — H00019 Hordeolum externum unspecified eye, unspecified eyelid: Secondary | ICD-10-CM | POA: Insufficient documentation

## 2012-03-02 NOTE — Assessment & Plan Note (Signed)
Stop zetia as it is not helping lower LDL. Add welchol to atorvastatin to lower cholesterol. Return for cholesterol recheck in 3 months.

## 2012-03-02 NOTE — Assessment & Plan Note (Signed)
Resolved

## 2012-03-02 NOTE — Assessment & Plan Note (Signed)
No clear infection. Warm compresses . Call if not improving.

## 2012-03-21 ENCOUNTER — Telehealth: Payer: Self-pay

## 2012-03-21 NOTE — Telephone Encounter (Signed)
We can have her try fenofibrate.. Has she tried this in past? If not I will send it in.

## 2012-03-21 NOTE — Telephone Encounter (Signed)
Pt stopped Welchol due to migraine and reflux later part of Sept. Pt wanted to know other alternatives to [facility]. Pt presently taking lipitor 80 mg. [facility].Please advise.

## 2012-03-23 MED ORDER — FENOFIBRATE 160 MG PO TABS: 160.0000 mg | ORAL_TABLET | Freq: Every day | ORAL | Status: DC

## 2012-03-23 NOTE — Telephone Encounter (Signed)
Patient notified as instructed by telephone. Pt has not tried fenofibrate and pt will check with [facility] to pick up rx.

## 2012-03-23 NOTE — Telephone Encounter (Signed)
Pt has lab appt on 05/24/12.

## 2012-03-23 NOTE — Telephone Encounter (Signed)
Make sure pt scheduled for cholesterol and CMEt recheck in 3 months.

## 2012-05-09 ENCOUNTER — Telehealth: Payer: Self-pay

## 2012-05-09 NOTE — Telephone Encounter (Signed)
Okay to stop med. Remove from med list please. Work on [employer], weight loss if needed and exercise. We will check lipids at that appt.

## 2012-05-09 NOTE — Telephone Encounter (Signed)
Patient advised.

## 2012-05-09 NOTE — Telephone Encounter (Signed)
Pt said a caution came with Fenofibrate; possible interaction between atorvastatin and fenofibrate and caution with pts with kidney problem taking fenofibrate. Pt has hx of kidney disease and is seeing urologist. Pt prefers to continue taking atorvastatin but wants to stop fenofibrate until next annual check up.Please advise.

## 2012-05-24 ENCOUNTER — Other Ambulatory Visit: Payer: 59

## 2012-08-29 ENCOUNTER — Other Ambulatory Visit: Payer: Self-pay | Admitting: *Deleted

## 2012-08-29 MED ORDER — OMEPRAZOLE 40 MG PO CPDR: 40.0000 mg | DELAYED_RELEASE_CAPSULE | Freq: Every day | ORAL | Status: DC

## 2012-11-15 ENCOUNTER — Other Ambulatory Visit: Payer: Self-pay

## 2012-11-15 DIAGNOSIS — Z1231 Encounter for screening mammogram for malignant neoplasm of breast: Secondary | ICD-10-CM

## 2012-12-07 LAB — HM COLONOSCOPY

## 2012-12-28 ENCOUNTER — Other Ambulatory Visit: Payer: Self-pay | Admitting: *Deleted

## 2012-12-28 MED ORDER — ATORVASTATIN CALCIUM 80 MG PO TABS: 80.0000 mg | ORAL_TABLET | Freq: Every day | ORAL | Status: DC

## 2013-01-08 ENCOUNTER — Ambulatory Visit
Admission: RE | Admit: 2013-01-08 | Discharge: 2013-01-08 | Disposition: A | Discharge status: Home / Self Care | Payer: 59 | Source: Ambulatory Visit

## 2013-01-08 DIAGNOSIS — Z1231 Encounter for screening mammogram for malignant neoplasm of breast: Secondary | ICD-10-CM

## 2013-01-28 ENCOUNTER — Telehealth: Payer: Self-pay | Admitting: Family Medicine

## 2013-01-28 DIAGNOSIS — E785 Hyperlipidemia, unspecified: Secondary | ICD-10-CM

## 2013-01-28 DIAGNOSIS — R7303 Prediabetes: Secondary | ICD-10-CM

## 2013-01-28 DIAGNOSIS — M899 Disorder of bone, unspecified: Secondary | ICD-10-CM

## 2013-01-28 NOTE — Telephone Encounter (Signed)
Message copied by Excell Seltzer on Sun Jan 28, 2013 11:45 PM ------      Message from: Alvina Chou      Created: Wed Jan 24, 2013  2:47 PM      Regarding: Lab orders for Monday, 8.25.14       Patient is scheduled for CPX labs, please order future labs, Thanks , Terri       ------

## 2013-01-29 ENCOUNTER — Other Ambulatory Visit (INDEPENDENT_AMBULATORY_CARE_PROVIDER_SITE_OTHER): Payer: 59

## 2013-01-29 DIAGNOSIS — R7309 Other abnormal glucose: Secondary | ICD-10-CM

## 2013-01-29 DIAGNOSIS — E785 Hyperlipidemia, unspecified: Secondary | ICD-10-CM

## 2013-01-29 DIAGNOSIS — M899 Disorder of bone, unspecified: Secondary | ICD-10-CM

## 2013-01-29 DIAGNOSIS — R7303 Prediabetes: Secondary | ICD-10-CM

## 2013-01-29 LAB — LIPID PANEL
Cholesterol: 211 mg/dL — ABNORMAL HIGH (ref 0–200)
HDL: 48.1 mg/dL (ref >39.00)
Total CHOL/HDL Ratio: 4
Triglycerides: 161 mg/dL — ABNORMAL HIGH (ref 0.0–149.0)
VLDL: 32.2 mg/dL (ref 0.0–40.0)

## 2013-01-29 LAB — COMPREHENSIVE METABOLIC PANEL
ALT: 27 U/L (ref 0–35)
AST: 23 U/L (ref 0–37)
Albumin: 3.7 g/dL (ref 3.5–5.2)
Alkaline Phosphatase: 86 U/L (ref 39–117)
BUN: 18 mg/dL (ref 6–23)
CO2: 31 mEq/L (ref 19–32)
Calcium: 9.3 mg/dL (ref 8.4–10.5)
Chloride: 101 mEq/L (ref 96–112)
Creatinine, Ser: 0.8 mg/dL (ref 0.4–1.2)
GFR: 73.08 mL/min (ref >60.00)
Glucose, Bld: 111 mg/dL — ABNORMAL HIGH (ref 70–99)
Potassium: 3.8 mEq/L (ref 3.5–5.1)
Sodium: 140 mEq/L (ref 135–145)
Total Bilirubin: 0.4 mg/dL (ref 0.3–1.2)
Total Protein: 7.6 g/dL (ref 6.0–8.3)

## 2013-01-29 LAB — HEMOGLOBIN A1C: Hgb A1c MFr Bld: 6.7 % — ABNORMAL HIGH (ref 4.6–6.5)

## 2013-01-29 LAB — LDL CHOLESTEROL, DIRECT: Direct LDL: 160.8 mg/dL

## 2013-01-30 LAB — VITAMIN D 25 HYDROXY (VIT D DEFICIENCY, FRACTURES): Vit D, 25-Hydroxy: 67 ng/mL (ref 30–89)

## 2013-02-08 ENCOUNTER — Other Ambulatory Visit: Payer: 59

## 2013-02-15 ENCOUNTER — Encounter: Payer: Self-pay | Admitting: Family Medicine

## 2013-02-15 ENCOUNTER — Telehealth: Payer: Self-pay

## 2013-02-15 ENCOUNTER — Ambulatory Visit (INDEPENDENT_AMBULATORY_CARE_PROVIDER_SITE_OTHER): Payer: 59 | Admitting: Family Medicine

## 2013-02-15 VITALS — BP 100/70 | HR 81 | Temp 98.5°F | Ht 60.5 in | Wt 148.0 lb

## 2013-02-15 DIAGNOSIS — E785 Hyperlipidemia, unspecified: Secondary | ICD-10-CM

## 2013-02-15 DIAGNOSIS — M899 Disorder of bone, unspecified: Secondary | ICD-10-CM

## 2013-02-15 DIAGNOSIS — Z0000 Encounter for general adult medical examination without abnormal findings: Secondary | ICD-10-CM

## 2013-02-15 DIAGNOSIS — R7303 Prediabetes: Secondary | ICD-10-CM

## 2013-02-15 DIAGNOSIS — Z23 Encounter for immunization: Secondary | ICD-10-CM

## 2013-02-15 MED ORDER — INDAPAMIDE 1.25 MG PO TABS: 1.2500 mg | ORAL_TABLET | ORAL | Status: DC

## 2013-02-15 MED ORDER — COLESEVELAM HCL 625 MG PO TABS: 1875.0000 mg | ORAL_TABLET | Freq: Two times a day (BID) | ORAL | Status: DC

## 2013-02-15 NOTE — Telephone Encounter (Signed)
Pt left v/m; pt was seen today and [facility] needs new order for bone density since the order in system is over a year old. Pt did not have bone density last year. Pt does not require cb; pt said if order is sent to [facility] they will contact pt to schedule appt.

## 2013-02-15 NOTE — Assessment & Plan Note (Signed)
Work on lifestyle changes. She is limited by migraines.  Counseled on diet x 15 min. Refused nutritionist at this time. Recheck A1C in 6 months.

## 2013-02-15 NOTE — Assessment & Plan Note (Signed)
Poor control. Will try welchol although she is hesitant about HA SE possibility.

## 2013-02-15 NOTE — Progress Notes (Signed)
The patient is here for annual wellness exam and preventative care.    She is doing well overall.   She is being treated for migraines by Dr. Daphine Deutscher.  Saw Dr. Laverle Patter in July for kidney stones.  On indapamide.   We will follow here and prescribe medication.  Needs yearly BMET.   Vit D nml.  Prediabetes:  Lab Results  Component Value Date   HGBA1C 6.7* 01/29/2013     Elevated Cholesterol: Poor control on atorvastain 80 mg daily  In past zetia ineffective,  Lab Results  Component Value Date   CHOL 211* 01/29/2013   HDL 48.10 01/29/2013   LDLCALC 113* 09/03/2009   LDLDIRECT 160.8 01/29/2013   TRIG 161.0* 01/29/2013   CHOLHDL 4 01/29/2013   Using medications without problems: no  Muscle aches: No  Other complaints:  BP well controlled.  Prediabetes, some better than last year.  Exercise: Walks at [employer] 6 days a week, spends most her time there.  Diet: Moderate.   Review of Systems  Constitutional: Positive for fatigue. Negative for fever and unexpected weight change.  Associated with migraines  HENT: Negative for ear pain, congestion, sore throat, sneezing, trouble swallowing and sinus pressure.  Eyes: Negative for pain and itching.  Respiratory: Negative for cough, shortness of breath and wheezing.  Cardiovascular: Negative for chest pain, palpitations and leg swelling.  Gastrointestinal: Negative for nausea, diarrhea, constipation and blood in stool.  Genitourinary: Negative for dysuria, hematuria, vaginal discharge, difficulty urinating and menstrual problem.  Skin: Negative for rash.  Neurological: Positive for headaches. Negative for syncope, weakness, light-headedness and numbness.  Migraines  Psychiatric/Behavioral: Negative for confusion and dysphoric mood. The patient is not nervous/anxious.  Objective:   Physical Exam  Constitutional: Vital signs are normal. She appears well-developed and well-nourished. She is cooperative. Non-toxic appearance. She does not appear  ill. No distress.  HENT:  Head: Normocephalic.  Right Ear: Hearing, tympanic membrane, external ear and ear canal normal.  Left Ear: Hearing, tympanic membrane, external ear and ear canal normal.  Nose: Nose normal.  Eyes: Conjunctivae normal, EOM and lids are normal. Pupils are equal, round, and reactive to light. No foreign bodies found.  Neck: Trachea normal and normal range of motion. Neck supple. Carotid bruit is not present. No mass and no thyromegaly present.  Cardiovascular: Normal rate, regular rhythm, S1 normal, S2 normal, normal heart sounds and intact distal pulses. Exam reveals no gallop.  No murmur heard.  Pulmonary/Chest: Effort normal and breath sounds normal. No respiratory distress. She has no wheezes. She has no rhonchi. She has no rales.  Abdominal: Soft. Normal appearance and bowel sounds are normal. She exhibits no distension, no fluid wave, no abdominal bruit and no mass. There is no hepatosplenomegaly. There is no tenderness. There is no rebound, no guarding and no CVA tenderness. No hernia.  Genitourinary: Vagina normal and uterus normal. No breast swelling, tenderness, discharge or bleeding. Pelvic exam was performed with patient supine. There is no tenderness or lesion on the right labia. There is no tenderness or lesion on the left labia. Uterus is not enlarged and not tender. Right adnexum displays no mass, no tenderness and no fullness. Left adnexum displays no mass, no tenderness and no fullness.  Lymphadenopathy:  She has no cervical adenopathy.  She has no axillary adenopathy.  Neurological: She is alert. She has normal strength. No cranial nerve deficit or sensory deficit.  Skin: Skin is warm, dry and intact. No rash noted.  Psychiatric: Her speech  is normal and behavior is normal. Judgment normal. Her mood appears not anxious. Cognition and memory are normal. She does not exhibit a depressed mood.  Assessment & Plan:   The patient's preventative maintenance and  recommended screening tests for an annual wellness exam were reviewed in full today.  Brought up to date unless services declined.  Counselled on the importance of diet, exercise, and its role in overall health and mortality.  The patient's FH and SH was reviewed, including their home life, tobacco status, and drug and alcohol status.   Colonoscopy was normal in 2014, repeat in 10 years. PAP last 2012 then every 3 years, DVE yearly.  flu given, Td, Shingles, Uptodate.  Last DEXA 2011, stable, nml vit D. She is not sure if she had last year.Marland Kitchen She will let us know if she wishes to repeat.  Mammo: nml 11/2012  Wt Readings from Last 3 Encounters:  02/15/13 148 lb (67.132 kg)  02/15/12 156 lb 8 oz (70.988 kg)  04/22/11 150 lb 8 oz (68.266 kg)

## 2013-02-15 NOTE — Patient Instructions (Addendum)
Follow up in 6 months prediabetes, cholesterol with labs prior. Try addition of welchol .Marland Kitchen Continue if no SE. Let us know if you are interested in bone density.

## 2013-02-20 ENCOUNTER — Ambulatory Visit
Admission: RE | Admit: 2013-02-20 | Discharge: 2013-02-20 | Disposition: A | Discharge status: Home / Self Care | Payer: 59 | Source: Ambulatory Visit | Attending: Family Medicine | Admitting: Family Medicine

## 2013-02-20 DIAGNOSIS — M899 Disorder of bone, unspecified: Secondary | ICD-10-CM

## 2013-03-26 ENCOUNTER — Other Ambulatory Visit: Payer: Self-pay | Admitting: Family Medicine

## 2013-08-06 ENCOUNTER — Other Ambulatory Visit: Payer: 59

## 2013-08-10 ENCOUNTER — Ambulatory Visit: Payer: 59 | Admitting: Family Medicine

## 2013-08-26 ENCOUNTER — Telehealth: Payer: Self-pay | Admitting: Family Medicine

## 2013-08-26 DIAGNOSIS — E785 Hyperlipidemia, unspecified: Secondary | ICD-10-CM

## 2013-08-26 DIAGNOSIS — M949 Disorder of cartilage, unspecified: Secondary | ICD-10-CM

## 2013-08-26 DIAGNOSIS — M899 Disorder of bone, unspecified: Secondary | ICD-10-CM

## 2013-08-26 DIAGNOSIS — R7303 Prediabetes: Secondary | ICD-10-CM

## 2013-08-26 NOTE — Telephone Encounter (Signed)
Message copied by [name] on Sun Aug 26, 2013 10:54 PM ------      Message from: Selinda Orion J      Created: Mon Aug 20, 2013 10:05 AM      Regarding: Lab orders for Monday, 3.23.15       Lab orders for a 6 month f/u ------

## 2013-08-27 ENCOUNTER — Other Ambulatory Visit (INDEPENDENT_AMBULATORY_CARE_PROVIDER_SITE_OTHER): Payer: 59

## 2013-08-27 DIAGNOSIS — E785 Hyperlipidemia, unspecified: Secondary | ICD-10-CM

## 2013-08-27 DIAGNOSIS — R7303 Prediabetes: Secondary | ICD-10-CM

## 2013-08-27 DIAGNOSIS — M949 Disorder of cartilage, unspecified: Secondary | ICD-10-CM

## 2013-08-27 DIAGNOSIS — M899 Disorder of bone, unspecified: Secondary | ICD-10-CM

## 2013-08-27 DIAGNOSIS — R7309 Other abnormal glucose: Secondary | ICD-10-CM

## 2013-08-27 LAB — LIPID PANEL
Cholesterol: 199 mg/dL (ref 0–200)
HDL: 53.5 mg/dL (ref >39.00)
LDL Cholesterol: 118 mg/dL — ABNORMAL HIGH (ref 0–99)
Total CHOL/HDL Ratio: 4
Triglycerides: 136 mg/dL (ref 0.0–149.0)
VLDL: 27.2 mg/dL (ref 0.0–40.0)

## 2013-08-27 LAB — COMPREHENSIVE METABOLIC PANEL
ALT: 33 U/L (ref 0–35)
AST: 28 U/L (ref 0–37)
Albumin: 3.7 g/dL (ref 3.5–5.2)
Alkaline Phosphatase: 78 U/L (ref 39–117)
BUN: 15 mg/dL (ref 6–23)
CO2: 33 mEq/L — ABNORMAL HIGH (ref 19–32)
Calcium: 9.4 mg/dL (ref 8.4–10.5)
Chloride: 100 mEq/L (ref 96–112)
Creatinine, Ser: 1 mg/dL (ref 0.4–1.2)
GFR: 61.06 mL/min (ref >60.00)
Glucose, Bld: 115 mg/dL — ABNORMAL HIGH (ref 70–99)
Potassium: 3.7 mEq/L (ref 3.5–5.1)
Sodium: 140 mEq/L (ref 135–145)
Total Bilirubin: 0.7 mg/dL (ref 0.3–1.2)
Total Protein: 7.1 g/dL (ref 6.0–8.3)

## 2013-08-28 LAB — VITAMIN D 25 HYDROXY (VIT D DEFICIENCY, FRACTURES): Vit D, 25-Hydroxy: 57 ng/mL (ref 30–89)

## 2013-08-31 ENCOUNTER — Ambulatory Visit (INDEPENDENT_AMBULATORY_CARE_PROVIDER_SITE_OTHER): Payer: 59 | Admitting: Family Medicine

## 2013-08-31 ENCOUNTER — Encounter: Payer: Self-pay | Admitting: Family Medicine

## 2013-08-31 VITALS — BP 102/68 | HR 78 | Temp 98.6°F | Ht 60.5 in | Wt 143.5 lb

## 2013-08-31 DIAGNOSIS — E785 Hyperlipidemia, unspecified: Secondary | ICD-10-CM

## 2013-08-31 DIAGNOSIS — R7309 Other abnormal glucose: Secondary | ICD-10-CM

## 2013-08-31 DIAGNOSIS — R7303 Prediabetes: Secondary | ICD-10-CM

## 2013-08-31 LAB — HEMOGLOBIN A1C: Hgb A1c MFr Bld: 6.7 % — ABNORMAL HIGH (ref 4.6–6.5)

## 2013-08-31 MED ORDER — CLOTRIMAZOLE-BETAMETHASONE 1-0.05 % EX CREA: 1.0000 "application " | TOPICAL_CREAM | CUTANEOUS | Status: DC | PRN

## 2013-08-31 NOTE — Progress Notes (Signed)
Pre visit review using our clinic review tool, if applicable. No additional management support is needed unless otherwise documented below in the visit note. 

## 2013-08-31 NOTE — Assessment & Plan Note (Signed)
Due for A1C. She has continued to work aggressively on lifestyle.

## 2013-08-31 NOTE — Patient Instructions (Addendum)
We can try to decrease liptior to 40 mg daily. Continue welchol. Continue healthy lifestyle changes. Stop at lab on way out. Schedule CPX with fasting labs prior in 6 months.

## 2013-08-31 NOTE — Progress Notes (Signed)
   Subjective:    Patient ID: Debra Leach, female    DOB: 02-May-1951, 63 y.o.   MRN: [id number]  HPI  63 year old female presents for 6 months follow up.  Prediabetes, due for recheck A1C. Lab Results  Component Value Date   HGBA1C 6.7* 01/29/2013  FBS 115! She has decreased sugar in her diet.  Elevated Cholesterol:  LDl much improved on atorvastatin 80 mg daily and new addition of welchol. LDL < 130. Using medications without problems: None Muscle aches: None Diet compliance: Improving. Exercise: Good. Other complaints:  Lab Results  Component Value Date   CHOL 199 08/27/2013   HDL 53.50 08/27/2013   LDLCALC 118* 08/27/2013   LDLDIRECT 160.8 01/29/2013   TRIG 136.0 08/27/2013   CHOLHDL 4 08/27/2013   Wt Readings from Last 3 Encounters:  08/31/13 143 lb 8 oz (65.091 kg)  02/15/13 148 lb (67.132 kg)  02/15/12 156 lb 8 oz (70.988 kg)      Review of Systems  Constitutional: Negative for fever and fatigue.  HENT: Negative for ear pain.   Eyes: Negative for pain.  Respiratory: Negative for chest tightness and shortness of breath.   Cardiovascular: Negative for chest pain, palpitations and leg swelling.  Gastrointestinal: Negative for abdominal pain.  Genitourinary: Negative for dysuria.       Objective:   Physical Exam  Constitutional: Vital signs are normal. She appears well-developed and well-nourished. She is cooperative.  Non-toxic appearance. She does not appear ill. No distress.  HENT:  Head: Normocephalic.  Right Ear: Hearing, tympanic membrane, external ear and ear canal normal. Tympanic membrane is not erythematous, not retracted and not bulging.  Left Ear: Hearing, tympanic membrane, external ear and ear canal normal. Tympanic membrane is not erythematous, not retracted and not bulging.  Nose: No mucosal edema or rhinorrhea. Right sinus exhibits no maxillary sinus tenderness and no frontal sinus tenderness. Left sinus exhibits no maxillary sinus tenderness  and no frontal sinus tenderness.  Mouth/Throat: Uvula is midline, oropharynx is clear and moist and mucous membranes are normal.  Eyes: Conjunctivae, EOM and lids are normal. Pupils are equal, round, and reactive to light. Lids are everted and swept, no foreign bodies found.  Neck: Trachea normal and normal range of motion. Neck supple. Carotid bruit is not present. No mass and no thyromegaly present.  Cardiovascular: Normal rate, regular rhythm, S1 normal, S2 normal, normal heart sounds, intact distal pulses and normal pulses.  Exam reveals no gallop and no friction rub.   No murmur heard. Pulmonary/Chest: Effort normal and breath sounds normal. Not tachypneic. No respiratory distress. She has no decreased breath sounds. She has no wheezes. She has no rhonchi. She has no rales.  Abdominal: Soft. Normal appearance and bowel sounds are normal. There is no tenderness.  Neurological: She is alert.  Skin: Skin is warm, dry and intact. No rash noted.  Psychiatric: Her speech is normal and behavior is normal. Judgment and thought content normal. Her mood appears not anxious. Cognition and memory are normal. She does not exhibit a depressed mood.          Assessment & Plan:

## 2013-08-31 NOTE — Assessment & Plan Note (Signed)
Great control on welchol and lipoitor... Pt wishes to try decrease of lipitor to see if she can maintain LDl < 130. Recheck in 3 months.

## 2013-09-30 ENCOUNTER — Other Ambulatory Visit: Payer: Self-pay | Admitting: Family Medicine

## 2013-11-08 ENCOUNTER — Other Ambulatory Visit: Payer: Self-pay | Admitting: Orthopaedic Surgery

## 2013-11-08 DIAGNOSIS — M25562 Pain in left knee: Secondary | ICD-10-CM

## 2013-11-09 ENCOUNTER — Ambulatory Visit
Admission: RE | Admit: 2013-11-09 | Discharge: 2013-11-09 | Disposition: A | Discharge status: Home / Self Care | Payer: 59 | Source: Ambulatory Visit | Attending: Orthopaedic Surgery | Admitting: Orthopaedic Surgery

## 2013-11-09 DIAGNOSIS — M25562 Pain in left knee: Secondary | ICD-10-CM

## 2013-12-03 ENCOUNTER — Other Ambulatory Visit: Payer: Self-pay

## 2013-12-03 DIAGNOSIS — Z1231 Encounter for screening mammogram for malignant neoplasm of breast: Secondary | ICD-10-CM

## 2014-01-09 ENCOUNTER — Encounter (INDEPENDENT_AMBULATORY_CARE_PROVIDER_SITE_OTHER): Payer: Self-pay

## 2014-01-09 ENCOUNTER — Ambulatory Visit
Admission: RE | Admit: 2014-01-09 | Discharge: 2014-01-09 | Disposition: A | Discharge status: Home / Self Care | Payer: 59 | Source: Ambulatory Visit

## 2014-01-09 DIAGNOSIS — Z1231 Encounter for screening mammogram for malignant neoplasm of breast: Secondary | ICD-10-CM

## 2014-02-03 ENCOUNTER — Telehealth: Payer: Self-pay | Admitting: Family Medicine

## 2014-02-03 DIAGNOSIS — R7303 Prediabetes: Secondary | ICD-10-CM

## 2014-02-03 DIAGNOSIS — E785 Hyperlipidemia, unspecified: Secondary | ICD-10-CM

## 2014-02-03 DIAGNOSIS — M949 Disorder of cartilage, unspecified: Secondary | ICD-10-CM

## 2014-02-03 DIAGNOSIS — M899 Disorder of bone, unspecified: Secondary | ICD-10-CM

## 2014-02-03 NOTE — Telephone Encounter (Signed)
Message copied by [name] on Sun Feb 03, 2014 10:55 PM ------      Message from: Ellamae Sia      Created: Wed Jan 30, 2014 10:54 AM      Regarding: Lab orders for Monday, 8.31.15       Patient is scheduled for CPX labs, please order future labs, Thanks , Terri       ------

## 2014-02-04 ENCOUNTER — Other Ambulatory Visit (INDEPENDENT_AMBULATORY_CARE_PROVIDER_SITE_OTHER): Payer: 59

## 2014-02-04 DIAGNOSIS — E785 Hyperlipidemia, unspecified: Secondary | ICD-10-CM

## 2014-02-04 DIAGNOSIS — R7309 Other abnormal glucose: Secondary | ICD-10-CM

## 2014-02-04 DIAGNOSIS — R7303 Prediabetes: Secondary | ICD-10-CM

## 2014-02-04 LAB — LIPID PANEL
Cholesterol: 226 mg/dL — ABNORMAL HIGH (ref 0–200)
HDL: 58.7 mg/dL (ref >39.00)
LDL Cholesterol: 130 mg/dL — ABNORMAL HIGH (ref 0–99)
NonHDL: 167.3
Total CHOL/HDL Ratio: 4
Triglycerides: 189 mg/dL — ABNORMAL HIGH (ref 0.0–149.0)
VLDL: 37.8 mg/dL (ref 0.0–40.0)

## 2014-02-04 LAB — COMPREHENSIVE METABOLIC PANEL
ALT: 44 U/L — ABNORMAL HIGH (ref 0–35)
AST: 30 U/L (ref 0–37)
Albumin: 3.6 g/dL (ref 3.5–5.2)
Alkaline Phosphatase: 91 U/L (ref 39–117)
BUN: 16 mg/dL (ref 6–23)
CO2: 28 mEq/L (ref 19–32)
Calcium: 9.4 mg/dL (ref 8.4–10.5)
Chloride: 104 mEq/L (ref 96–112)
Creatinine, Ser: 0.9 mg/dL (ref 0.4–1.2)
GFR: 70.89 mL/min (ref >60.00)
Glucose, Bld: 104 mg/dL — ABNORMAL HIGH (ref 70–99)
Potassium: 4.5 mEq/L (ref 3.5–5.1)
Sodium: 143 mEq/L (ref 135–145)
Total Bilirubin: 0.6 mg/dL (ref 0.2–1.2)
Total Protein: 7.2 g/dL (ref 6.0–8.3)

## 2014-02-12 ENCOUNTER — Other Ambulatory Visit: Payer: 59

## 2014-02-19 ENCOUNTER — Ambulatory Visit (INDEPENDENT_AMBULATORY_CARE_PROVIDER_SITE_OTHER): Payer: 59 | Admitting: Family Medicine

## 2014-02-19 ENCOUNTER — Other Ambulatory Visit (HOSPITAL_COMMUNITY)
Admission: RE | Admit: 2014-02-19 | Discharge: 2014-02-19 | Disposition: A | Discharge status: Home / Self Care | Payer: 59 | Source: Ambulatory Visit | Attending: Family Medicine | Admitting: Family Medicine

## 2014-02-19 ENCOUNTER — Encounter: Payer: Self-pay | Admitting: Family Medicine

## 2014-02-19 VITALS — BP 100/60 | HR 89 | Temp 99.0°F | Ht 60.5 in | Wt 139.8 lb

## 2014-02-19 DIAGNOSIS — H6980 Other specified disorders of Eustachian tube, unspecified ear: Secondary | ICD-10-CM

## 2014-02-19 DIAGNOSIS — E785 Hyperlipidemia, unspecified: Secondary | ICD-10-CM

## 2014-02-19 DIAGNOSIS — Z1151 Encounter for screening for human papillomavirus (HPV): Secondary | ICD-10-CM | POA: Insufficient documentation

## 2014-02-19 DIAGNOSIS — H6982 Other specified disorders of Eustachian tube, left ear: Secondary | ICD-10-CM

## 2014-02-19 DIAGNOSIS — M949 Disorder of cartilage, unspecified: Secondary | ICD-10-CM

## 2014-02-19 DIAGNOSIS — M899 Disorder of bone, unspecified: Secondary | ICD-10-CM

## 2014-02-19 DIAGNOSIS — Z01419 Encounter for gynecological examination (general) (routine) without abnormal findings: Secondary | ICD-10-CM | POA: Insufficient documentation

## 2014-02-19 DIAGNOSIS — Z23 Encounter for immunization: Secondary | ICD-10-CM

## 2014-02-19 DIAGNOSIS — R7303 Prediabetes: Secondary | ICD-10-CM

## 2014-02-19 DIAGNOSIS — Z124 Encounter for screening for malignant neoplasm of cervix: Secondary | ICD-10-CM

## 2014-02-19 DIAGNOSIS — R7309 Other abnormal glucose: Secondary | ICD-10-CM

## 2014-02-19 DIAGNOSIS — Z0000 Encounter for general adult medical examination without abnormal findings: Secondary | ICD-10-CM

## 2014-02-19 DIAGNOSIS — H6992 Unspecified Eustachian tube disorder, left ear: Secondary | ICD-10-CM

## 2014-02-19 NOTE — Progress Notes (Signed)
The patient is here for annual wellness exam and preventative care.   She is doing well overall.  She has had knee surgery. Has some hip and back issues followed by ortho.  She has noted fullness and pain internmittantly in left ear x 3 weeks. Hot compresses has improve pain over night. No hearing changes.  She is being treated for migraines by Dr. Hassell Done, but he has retired. She has appt with Dr. Bjorn Loser tommorrow, neurologist.  She has started having intermittent vision, lightheadedness changes with  migranes.  Saw Dr. Alinda Money in past for kidney stones.  On indapamide.  We will follow here and prescribe medication.  Needs yearly BMET.   Vit D nml.   Prediabetes:  Lab Results  Component Value Date   HGBA1C 6.7* 08/31/2013    Elevated Cholesterol: Adequate control on atorvastain 40 mg daily In past zetia ineffective,  LDL at goal <130. On welchol as well. Lab Results  Component Value Date   CHOL 226* 02/04/2014   HDL 58.70 02/04/2014   LDLCALC 130* 02/04/2014   LDLDIRECT 160.8 01/29/2013   TRIG 189.0* 02/04/2014   CHOLHDL 4 02/04/2014  Using medications without problems: no  Muscle aches: No  Other complaints:   BP well controlled.  BP Readings from Last 3 Encounters:  02/19/14 100/60  08/31/13 102/68  02/15/13 100/70   Exercise: Using stationary bicycle. Diet: Moderate. She cannot  Wt Readings from Last 3 Encounters:  02/19/14 139 lb 12 oz (63.39 kg)  08/31/13 143 lb 8 oz (65.091 kg)  02/15/13 148 lb (67.132 kg)    Review of Systems  Constitutional: Positive for fatigue. Negative for fever and unexpected weight change.  Associated with migraines  HENT: Negative for ear pain, congestion, sore throat, sneezing, trouble swallowing and sinus pressure.  Eyes: Negative for pain and itching.  Respiratory: Negative for cough, shortness of breath and wheezing.  Cardiovascular: Negative for chest pain, palpitations and leg swelling.  Gastrointestinal: Negative for nausea,  diarrhea, constipation and blood in stool.  Genitourinary: Negative for dysuria, hematuria, vaginal discharge, difficulty urinating and menstrual problem.  Skin: Negative for rash.  Neurological: Positive for headaches. Negative for syncope, weakness, light-headedness and numbness.  Migraines  Psychiatric/Behavioral: Negative for confusion and dysphoric mood. The patient is not nervous/anxious.  Objective:   Physical Exam  Constitutional: Vital signs are normal. She appears well-developed and well-nourished. She is cooperative. Non-toxic appearance. She does not appear ill. No distress.  HENT:  Head: Normocephalic.  Right Ear: Hearing, tympanic membrane, external ear and ear canal normal.  Left Ear: Hearing, tympanic membrane, external ear and ear canal normal.  Nose: Nose normal.  Eyes: Conjunctivae normal, EOM and lids are normal. Pupils are equal, round, and reactive to light. No foreign bodies found.  Neck: Trachea normal and normal range of motion. Neck supple. Carotid bruit is not present. No mass and no thyromegaly present.  Cardiovascular: Normal rate, regular rhythm, S1 normal, S2 normal, normal heart sounds and intact distal pulses. Exam reveals no gallop.  No murmur heard.  Pulmonary/Chest: Effort normal and breath sounds normal. No respiratory distress. She has no wheezes. She has no rhonchi. She has no rales.  Abdominal: Soft. Normal appearance and bowel sounds are normal. She exhibits no distension, no fluid wave, no abdominal bruit and no mass. There is no hepatosplenomegaly. There is no tenderness. There is no rebound, no guarding and no CVA tenderness. No hernia.  Genitourinary: Vagina normal and uterus normal. No breast swelling, tenderness, discharge or bleeding.  Pelvic exam was performed with patient supine. There is no tenderness or lesion on the right labia. There is no tenderness or lesion on the left labia. Uterus is not enlarged and not tender. Right adnexum displays no  mass, no tenderness and no fullness. Left adnexum displays no mass, no tenderness and no fullness.  Lymphadenopathy:  She has no cervical adenopathy.  She has no axillary adenopathy.  Neurological: She is alert. She has normal strength. No cranial nerve deficit or sensory deficit.  Skin: Skin is warm, dry and intact. No rash noted.  Psychiatric: Her speech is normal and behavior is normal. Judgment normal. Her mood appears not anxious. Cognition and memory are normal. She does not exhibit a depressed mood.  Assessment & Plan:   The patient's preventative maintenance and recommended screening tests for an annual wellness exam were reviewed in full today.  Brought up to date unless services declined.  Counselled on the importance of diet, exercise, and its role in overall health and mortality.  The patient's FH and SH was reviewed, including their home life, tobacco status, and drug and alcohol status.   Colonoscopy was normal in 2014, repeat in 10 years.  PAP last 2012 then every 3 years, DVE yearly.  PAP due this year. flu given, Td, Shingles, Uptodate.  Last DEXA  Stable osteopenia, nml vit D. 02/2013 Mammo: nml 01/2014

## 2014-02-19 NOTE — Addendum Note (Signed)
Addended by: Carter Kitten on: 02/19/2014 09:07 AM   Modules accepted: Orders

## 2014-02-19 NOTE — Assessment & Plan Note (Signed)
At goal LDL < 130 on 40 mg statin and welchol. Encouraged exercise, weight loss, healthy eating habits.

## 2014-02-19 NOTE — Assessment & Plan Note (Signed)
Stable

## 2014-02-19 NOTE — Assessment & Plan Note (Signed)
Stable control, unable to change diet given migraines. Will work on increased exercise.

## 2014-02-19 NOTE — Progress Notes (Signed)
Pre visit review using our clinic review tool, if applicable. No additional management support is needed unless otherwise documented below in the visit note. 

## 2014-02-19 NOTE — Patient Instructions (Signed)
Nasal steroid  ( flonase) spray 2 sprays per nosttril daily x 2 weeks.

## 2014-02-19 NOTE — Assessment & Plan Note (Signed)
Nasal steroid spray 2 sprays [per nosttril daily x 2 weeks.

## 2014-02-20 LAB — CYTOLOGY - PAP

## 2014-02-25 ENCOUNTER — Encounter: Payer: Self-pay | Admitting: *Deleted

## 2014-02-26 ENCOUNTER — Other Ambulatory Visit: Payer: Self-pay | Admitting: Family Medicine

## 2014-03-06 LAB — HM DIABETES EYE EXAM

## 2014-04-17 ENCOUNTER — Other Ambulatory Visit: Payer: Self-pay | Admitting: Orthopaedic Surgery

## 2014-04-17 DIAGNOSIS — M4310 Spondylolisthesis, site unspecified: Secondary | ICD-10-CM

## 2014-04-17 DIAGNOSIS — M545 Low back pain: Secondary | ICD-10-CM

## 2014-04-21 ENCOUNTER — Ambulatory Visit
Admission: RE | Admit: 2014-04-21 | Discharge: 2014-04-21 | Disposition: A | Discharge status: Home / Self Care | Payer: 59 | Source: Ambulatory Visit | Attending: Orthopaedic Surgery | Admitting: Orthopaedic Surgery

## 2014-04-21 DIAGNOSIS — M4310 Spondylolisthesis, site unspecified: Secondary | ICD-10-CM

## 2014-04-21 DIAGNOSIS — M545 Low back pain: Secondary | ICD-10-CM

## 2014-04-23 ENCOUNTER — Ambulatory Visit (INDEPENDENT_AMBULATORY_CARE_PROVIDER_SITE_OTHER): Payer: 59 | Admitting: Family Medicine

## 2014-04-23 ENCOUNTER — Encounter: Payer: Self-pay | Admitting: Family Medicine

## 2014-04-23 VITALS — BP 118/70 | HR 75 | Temp 98.4°F | Ht 60.5 in | Wt 146.8 lb

## 2014-04-23 DIAGNOSIS — H6091 Unspecified otitis externa, right ear: Secondary | ICD-10-CM

## 2014-04-23 DIAGNOSIS — H6982 Other specified disorders of Eustachian tube, left ear: Secondary | ICD-10-CM

## 2014-04-23 MED ORDER — CIPROFLOXACIN-HYDROCORTISONE 0.2-1 % OT SUSP: 3.0000 [drp] | Freq: Two times a day (BID) | OTIC | Status: AC

## 2014-04-23 MED ORDER — CLOTRIMAZOLE-BETAMETHASONE 1-0.05 % EX CREA: 1.0000 "application " | TOPICAL_CREAM | CUTANEOUS | Status: DC | PRN

## 2014-04-23 NOTE — Progress Notes (Signed)
   Subjective:    Patient ID: Debra Leach, female    DOB: May 15, 1951, 63 y.o.   MRN: [id number]  Otalgia  There is pain in the right ear. This is a chronic problem. The current episode started more than 1 month ago (off and on since CPX in 02/2014, returned in 3 days). The problem has been waxing and waning. There has been no fever. The pain is moderate. Associated symptoms include hearing loss and a sore throat. Pertinent negatives include no coughing, ear discharge, headaches, neck pain, rash, rhinorrhea or vomiting. Associated symptoms comments: Tender in right throat this AM as week.  ear feels muffled and blocked. Treatments tried: may have improved with nasal steroid spray. The treatment provided significant relief. There is no history of a chronic ear infection, hearing loss or a tympanostomy tube.       Review of Systems  HENT: Positive for ear pain, hearing loss and sore throat. Negative for ear discharge and rhinorrhea.   Respiratory: Negative for cough.   Gastrointestinal: Negative for vomiting.  Musculoskeletal: Negative for neck pain.  Skin: Negative for rash.  Neurological: Negative for headaches.       Objective:   Physical Exam  Constitutional: Vital signs are normal. She appears well-developed and well-nourished. She is cooperative.  Non-toxic appearance. She does not appear ill. No distress.  HENT:  Head: Normocephalic.  Right Ear: Hearing normal. There is swelling and tenderness. Tympanic membrane is not injected, not erythematous, not retracted and not bulging.  Left Ear: Hearing, tympanic membrane, external ear and ear canal normal. Tympanic membrane is not erythematous, not retracted and not bulging.  Nose: Mucosal edema and rhinorrhea present. Right sinus exhibits no maxillary sinus tenderness and no frontal sinus tenderness. Left sinus exhibits no maxillary sinus tenderness and no frontal sinus tenderness.  Mouth/Throat: Uvula is midline, oropharynx is clear  and moist and mucous membranes are normal.  Tenderness, swelling anfd erythema in right ear canal  Eyes: Conjunctivae, EOM and lids are normal. Pupils are equal, round, and reactive to light. Lids are everted and swept, no foreign bodies found.  Neck: Trachea normal and normal range of motion. Neck supple. Carotid bruit is not present. No thyroid mass and no thyromegaly present.  Cardiovascular: Normal rate, regular rhythm, S1 normal, S2 normal, normal heart sounds, intact distal pulses and normal pulses.  Exam reveals no gallop and no friction rub.   No murmur heard. Pulmonary/Chest: Effort normal and breath sounds normal. No tachypnea. No respiratory distress. She has no decreased breath sounds. She has no wheezes. She has no rhonchi. She has no rales.  Neurological: She is alert.  Skin: Skin is warm, dry and intact. No rash noted.  Psychiatric: Her speech is normal and behavior is normal. Judgment normal. Her mood appears not anxious. Cognition and memory are normal. She does not exhibit a depressed mood.          Assessment & Plan:

## 2014-04-23 NOTE — Assessment & Plan Note (Signed)
Repeat course of nasal steroids as needed.

## 2014-04-23 NOTE — Assessment & Plan Note (Signed)
Treat with antibiotoics and topical steroids.

## 2014-04-23 NOTE — Patient Instructions (Signed)
Treat external otitis with antibiotics ear drops.  Treat eustacian tube dysfunction with nasal fluticasone 2 sprays per nostril daily.

## 2014-04-23 NOTE — Progress Notes (Signed)
Pre visit review using our clinic review tool, if applicable. No additional management support is needed unless otherwise documented below in the visit note. 

## 2014-05-28 ENCOUNTER — Other Ambulatory Visit: Payer: Self-pay | Admitting: Family Medicine

## 2014-06-13 ENCOUNTER — Other Ambulatory Visit: Payer: Self-pay | Admitting: Family Medicine

## 2014-07-24 ENCOUNTER — Encounter: Payer: Self-pay | Admitting: Family Medicine

## 2014-07-24 ENCOUNTER — Ambulatory Visit (INDEPENDENT_AMBULATORY_CARE_PROVIDER_SITE_OTHER): Payer: 59 | Admitting: Family Medicine

## 2014-07-24 ENCOUNTER — Telehealth: Payer: Self-pay | Admitting: *Deleted

## 2014-07-24 VITALS — BP 120/84 | HR 86 | Temp 98.2°F | Ht 60.5 in | Wt 145.0 lb

## 2014-07-24 DIAGNOSIS — R519 Headache, unspecified: Secondary | ICD-10-CM

## 2014-07-24 DIAGNOSIS — H539 Unspecified visual disturbance: Secondary | ICD-10-CM

## 2014-07-24 DIAGNOSIS — R51 Headache: Secondary | ICD-10-CM

## 2014-07-24 DIAGNOSIS — R42 Dizziness and giddiness: Secondary | ICD-10-CM

## 2014-07-24 MED ORDER — ALPRAZOLAM 0.25 MG PO TABS: ORAL_TABLET | ORAL | Status: DC

## 2014-07-24 NOTE — Progress Notes (Signed)
Subjective:    Patient ID: Debra Leach, female    DOB: 1950/11/02, 64 y.o.   MRN: [id number]  HPI  64 year old female with history of  migraine presents with worsening of migraines.  Started with migraine age 58.. Had complete whiteout of visions, since then chronic daily headache. No further vision changes except period in 1980s. Nml MRI in past. Has always had dx migraine without aura.  She has started with visual changes again since last 08/2013. Also new vertigo. Gabapentin ( this her acute med treatment) helped with these visual symtpoms symptoms. Has tens unit for headache. Every 4-6 weeks central vision blurred. Nml peripheral vision. Usually precurser to severe headache.    Neuro was Dr. Ward Chatters.Marland Kitchen Retired.  She has followed with Dr. Bjorn Loser in 9 and 05/2015. She was uncomfortable with new MD. He is uncomfortable that he has not laid out a plan. She has set up a referral to a new neurologist in 3 weeks.  In last 9 days she has had  4 episodes of vision changes.. Dancing lights, loines at sides of eyes. Has daily migraine with episodes of  even more severe 4-5 a week severe headache. Associated with nausea. No new weakness, no new numbness, no slurred speech, no confusion. No fever.  She is concerned something else may be going in.    Last eye exam 12/2014.   Review of Systems  Constitutional: Negative for fever and fatigue.  HENT: Negative for ear pain.   Eyes: Negative for pain.  Respiratory: Negative for chest tightness and shortness of breath.   Cardiovascular: Negative for chest pain, palpitations and leg swelling.  Gastrointestinal: Negative for abdominal pain.  Genitourinary: Negative for dysuria.       Objective:   Physical Exam  Constitutional: She is oriented to person, place, and time. Vital signs are normal. She appears well-developed and well-nourished. She is cooperative.  Non-toxic appearance. She does not appear ill. No distress.  HENT:    Head: Normocephalic.  Right Ear: Hearing, tympanic membrane, external ear and ear canal normal. Tympanic membrane is not erythematous, not retracted and not bulging.  Left Ear: Hearing, tympanic membrane, external ear and ear canal normal. Tympanic membrane is not erythematous, not retracted and not bulging.  Nose: No mucosal edema or rhinorrhea. Right sinus exhibits no maxillary sinus tenderness and no frontal sinus tenderness. Left sinus exhibits no maxillary sinus tenderness and no frontal sinus tenderness.  Mouth/Throat: Uvula is midline, oropharynx is clear and moist and mucous membranes are normal.  Eyes: Conjunctivae, EOM and lids are normal. Pupils are equal, round, and reactive to light. Lids are everted and swept, no foreign bodies found.  Neck: Trachea normal and normal range of motion. Neck supple. Carotid bruit is not present. No thyroid mass and no thyromegaly present.  Cardiovascular: Normal rate, regular rhythm, S1 normal, S2 normal, normal heart sounds, intact distal pulses and normal pulses.  Exam reveals no gallop and no friction rub.   No murmur heard. Pulmonary/Chest: Effort normal and breath sounds normal. No tachypnea. No respiratory distress. She has no decreased breath sounds. She has no wheezes. She has no rhonchi. She has no rales.  Abdominal: Soft. Normal appearance and bowel sounds are normal. There is no tenderness.  Neurological: She is alert and oriented to person, place, and time. She has normal strength and normal reflexes. No cranial nerve deficit or sensory deficit. She exhibits normal muscle tone. She displays a negative Romberg sign. Coordination and gait  normal. GCS eye subscore is 4. GCS verbal subscore is 5. GCS motor subscore is 6.  Nml cerebellar exam   No papilledema  Skin: Skin is warm, dry and intact. No rash noted.  Psychiatric: She has a normal mood and affect. Her speech is normal and behavior is normal. Judgment and thought content normal. Her mood  appears not anxious. Cognition and memory are normal. Cognition and memory are not impaired. She does not exhibit a depressed mood. She exhibits normal recent memory and normal remote memory.          Assessment & Plan:  Changing progressively severe headache with new visual symptoms and vertigo in pt with previously stable history of migraine.  Neuro exam today is reassuring, but symptoms remain concerning for secondary cause of headache. Recommend further eval with MRI with and without contrast to look for new mass,  CVA or other etiology of new symtpoms.  Pt will make appt with new neurologist ( she is unhappy with current care of curretn neurologist) Pt will use neuronitin and diclofenac to treat current headache symptoms.

## 2014-07-24 NOTE — Patient Instructions (Signed)
Stop at front desk on way out for referral to MRI.

## 2014-07-24 NOTE — Progress Notes (Signed)
Pre visit review using our clinic review tool, if applicable. No additional management support is needed unless otherwise documented below in the visit note. 

## 2014-07-24 NOTE — Telephone Encounter (Signed)
We now have some neurologist in [place] who are great... I am not sure if one specializes in migraine.  Rosaria Ferries do you know?

## 2014-07-24 NOTE — Telephone Encounter (Signed)
Mrs. Contino requested that I ask Dr. Diona Browner if there was a neurologist she would recommend either is [facility] or [place] that she could see for her Migraines.  Not sure if she wants to continue seeing her current neurologist. She ask that I email her the name to [email or web address]  Please advise.

## 2014-07-25 NOTE — Telephone Encounter (Signed)
[facility] sees ppl for Migraines it would be Dr Metta Clines. [facility]- Dr Gurney Maxin and Dr Manuella Ghazi

## 2014-07-25 NOTE — Telephone Encounter (Signed)
Names of Neurologist emailed to Mrs. Neville per her request.

## 2014-08-03 ENCOUNTER — Ambulatory Visit
Admission: RE | Admit: 2014-08-03 | Discharge: 2014-08-03 | Disposition: A | Discharge status: Home / Self Care | Payer: 59 | Source: Ambulatory Visit | Attending: Family Medicine | Admitting: Family Medicine

## 2014-08-03 DIAGNOSIS — H539 Unspecified visual disturbance: Secondary | ICD-10-CM

## 2014-08-03 DIAGNOSIS — R51 Headache: Principal | ICD-10-CM

## 2014-08-03 DIAGNOSIS — R519 Headache, unspecified: Secondary | ICD-10-CM

## 2014-08-03 MED ORDER — GADOBENATE DIMEGLUMINE 529 MG/ML IV SOLN
13.0000 mL | Freq: Once | INTRAVENOUS | Status: AC | PRN
  Administered 2014-08-03: 13 mL via INTRAVENOUS

## 2014-08-13 ENCOUNTER — Telehealth: Payer: Self-pay | Admitting: Family Medicine

## 2014-08-13 DIAGNOSIS — R7303 Prediabetes: Secondary | ICD-10-CM

## 2014-08-13 DIAGNOSIS — M8580 Other specified disorders of bone density and structure, unspecified site: Secondary | ICD-10-CM

## 2014-08-13 DIAGNOSIS — E785 Hyperlipidemia, unspecified: Secondary | ICD-10-CM

## 2014-08-13 NOTE — Telephone Encounter (Signed)
-----   Message from Ellamae Sia sent at 08/08/2014  2:55 PM EST ----- Regarding: Lab orders for Tuesday,3.8.16 Lab orders for 6 month labs

## 2014-08-14 ENCOUNTER — Other Ambulatory Visit (INDEPENDENT_AMBULATORY_CARE_PROVIDER_SITE_OTHER): Payer: 59

## 2014-08-14 DIAGNOSIS — M8580 Other specified disorders of bone density and structure, unspecified site: Secondary | ICD-10-CM

## 2014-08-14 DIAGNOSIS — E785 Hyperlipidemia, unspecified: Secondary | ICD-10-CM

## 2014-08-14 DIAGNOSIS — R7309 Other abnormal glucose: Secondary | ICD-10-CM

## 2014-08-14 DIAGNOSIS — R7303 Prediabetes: Secondary | ICD-10-CM

## 2014-08-14 LAB — HEMOGLOBIN A1C: Hgb A1c MFr Bld: 6.9 % — ABNORMAL HIGH (ref 4.6–6.5)

## 2014-08-14 LAB — LIPID PANEL
Cholesterol: 239 mg/dL — ABNORMAL HIGH (ref 0–200)
HDL: 65.7 mg/dL (ref >39.00)
LDL Cholesterol: 153 mg/dL — ABNORMAL HIGH (ref 0–99)
NonHDL: 173.3
Total CHOL/HDL Ratio: 4
Triglycerides: 101 mg/dL (ref 0.0–149.0)
VLDL: 20.2 mg/dL (ref 0.0–40.0)

## 2014-08-14 LAB — COMPREHENSIVE METABOLIC PANEL
ALT: 34 U/L (ref 0–35)
AST: 26 U/L (ref 0–37)
Albumin: 4 g/dL (ref 3.5–5.2)
Alkaline Phosphatase: 75 U/L (ref 39–117)
BUN: 16 mg/dL (ref 6–23)
CO2: 32 mEq/L (ref 19–32)
Calcium: 9.6 mg/dL (ref 8.4–10.5)
Chloride: 102 mEq/L (ref 96–112)
Creatinine, Ser: 0.82 mg/dL (ref 0.40–1.20)
GFR: 74.77 mL/min (ref >60.00)
Glucose, Bld: 107 mg/dL — ABNORMAL HIGH (ref 70–99)
Potassium: 4 mEq/L (ref 3.5–5.1)
Sodium: 140 mEq/L (ref 135–145)
Total Bilirubin: 0.5 mg/dL (ref 0.2–1.2)
Total Protein: 7.1 g/dL (ref 6.0–8.3)

## 2014-08-14 LAB — VITAMIN D 25 HYDROXY (VIT D DEFICIENCY, FRACTURES): VITD: 27.94 ng/mL — ABNORMAL LOW (ref 30.00–100.00)

## 2014-08-20 ENCOUNTER — Encounter: Payer: Self-pay | Admitting: Family Medicine

## 2014-08-20 ENCOUNTER — Ambulatory Visit (INDEPENDENT_AMBULATORY_CARE_PROVIDER_SITE_OTHER): Payer: 59 | Admitting: Family Medicine

## 2014-08-20 VITALS — BP 104/60 | HR 86 | Temp 98.5°F | Ht 60.5 in | Wt 146.2 lb

## 2014-08-20 DIAGNOSIS — Z8249 Family history of ischemic heart disease and other diseases of the circulatory system: Secondary | ICD-10-CM | POA: Insufficient documentation

## 2014-08-20 DIAGNOSIS — E785 Hyperlipidemia, unspecified: Secondary | ICD-10-CM

## 2014-08-20 DIAGNOSIS — E119 Type 2 diabetes mellitus without complications: Secondary | ICD-10-CM

## 2014-08-20 DIAGNOSIS — E559 Vitamin D deficiency, unspecified: Secondary | ICD-10-CM | POA: Insufficient documentation

## 2014-08-20 MED ORDER — VITAMIN D (ERGOCALCIFEROL) 1.25 MG (50000 UNIT) PO CAPS: 50000.0000 [IU] | ORAL_CAPSULE | ORAL | Status: DC

## 2014-08-20 NOTE — Patient Instructions (Addendum)
Work on low [employer] as able. Work on starting exercsie regimen. Start vit D3 50,000 units weekly for 12 weeks follow this with OTC vit D3 daily 400 IU.

## 2014-08-20 NOTE — Assessment & Plan Note (Signed)
Replete

## 2014-08-20 NOTE — Progress Notes (Signed)
   Subjective:    Patient ID: Debra Leach, female    DOB: November 20, 1950, 64 y.o.   MRN: [id number]  HPI   64 year old female presents  For 6 months follow up.   She has recently started seeing Dr. Melrose Nakayama for worsened migraine with aura. Felt likely rebound component.  MRI was nml.  Brother diagnosed with ascending aortic aneurysm.  Hx of smoking Has appt with Dr. Rockey Situ.   Elevated Cholesterol: LDL not at goal but improved in last 6 months on atorvastatin 80 mg daily and welchol, no SE.  Lab Results  Component Value Date   CHOL 239* 08/14/2014   HDL 65.70 08/14/2014   LDLCALC 153* 08/14/2014   LDLDIRECT 160.8 01/29/2013   TRIG 101.0 08/14/2014   CHOLHDL 4 08/14/2014  Using medications without problems: Muscle aches:  Diet compliance: Exercise: She is planning on  Other complaints:   Prediabetes: worsening .Marland Kitchen More accurately in diabetic range.   Lab Results  Component Value Date   HGBA1C 6.9* 08/14/2014    Vit D def: vit D low again    Review of Systems  Constitutional: Negative for fever and fatigue.  HENT: Negative for ear pain.   Eyes: Negative for pain.  Respiratory: Negative for chest tightness and shortness of breath.   Cardiovascular: Negative for chest pain, palpitations and leg swelling.  Gastrointestinal: Negative for abdominal pain.  Genitourinary: Negative for dysuria.       Objective:   Physical Exam  Constitutional: Vital signs are normal. She appears well-developed and well-nourished. She is cooperative.  Non-toxic appearance. She does not appear ill. No distress.  HENT:  Head: Normocephalic.  Right Ear: Hearing, tympanic membrane, external ear and ear canal normal. Tympanic membrane is not erythematous, not retracted and not bulging.  Left Ear: Hearing, tympanic membrane, external ear and ear canal normal. Tympanic membrane is not erythematous, not retracted and not bulging.  Nose: No mucosal edema or rhinorrhea. Right sinus exhibits no  maxillary sinus tenderness and no frontal sinus tenderness. Left sinus exhibits no maxillary sinus tenderness and no frontal sinus tenderness.  Mouth/Throat: Uvula is midline, oropharynx is clear and moist and mucous membranes are normal.  Eyes: Conjunctivae, EOM and lids are normal. Pupils are equal, round, and reactive to light. Lids are everted and swept, no foreign bodies found.  Neck: Trachea normal and normal range of motion. Neck supple. Carotid bruit is not present. No thyroid mass and no thyromegaly present.  Cardiovascular: Normal rate, regular rhythm, S1 normal, S2 normal, normal heart sounds, intact distal pulses and normal pulses.  Exam reveals no gallop and no friction rub.   No murmur heard. Pulmonary/Chest: Effort normal and breath sounds normal. No tachypnea. No respiratory distress. She has no decreased breath sounds. She has no wheezes. She has no rhonchi. She has no rales.  Abdominal: Soft. Normal appearance and bowel sounds are normal. There is no tenderness.  Neurological: She is alert.  Skin: Skin is warm, dry and intact. No rash noted.  Psychiatric: Her speech is normal and behavior is normal. Judgment and thought content normal. Her mood appears not anxious. Cognition and memory are normal. She does not exhibit a depressed mood.          Assessment & Plan:

## 2014-08-20 NOTE — Assessment & Plan Note (Signed)
Well controlled on no med. Encouraged exercise, weight loss, healthy eating habits. INfo provided.

## 2014-08-20 NOTE — Progress Notes (Signed)
Pre visit review using our clinic review tool, if applicable. No additional management support is needed unless otherwise documented below in the visit note. 

## 2014-08-20 NOTE — Assessment & Plan Note (Signed)
Planning to make appt to discuss risk and possible eval with Dr. Rockey Situ.

## 2014-08-20 NOTE — Assessment & Plan Note (Signed)
Improved triglycerides, HDL increasing, LDL worse. Now able to start exercising given back pain better. Continue atorvastatin, welchol.

## 2014-09-20 ENCOUNTER — Ambulatory Visit: Payer: 59 | Admitting: Cardiovascular Disease

## 2014-09-26 ENCOUNTER — Encounter: Payer: Self-pay | Admitting: Cardiovascular Disease

## 2014-09-26 ENCOUNTER — Ambulatory Visit (INDEPENDENT_AMBULATORY_CARE_PROVIDER_SITE_OTHER): Payer: 59 | Admitting: Cardiovascular Disease

## 2014-09-26 VITALS — BP 120/72 | HR 90 | Ht 60.0 in | Wt 147.8 lb

## 2014-09-26 DIAGNOSIS — E785 Hyperlipidemia, unspecified: Secondary | ICD-10-CM

## 2014-09-26 DIAGNOSIS — R079 Chest pain, unspecified: Secondary | ICD-10-CM

## 2014-09-26 DIAGNOSIS — Z8249 Family history of ischemic heart disease and other diseases of the circulatory system: Secondary | ICD-10-CM

## 2014-09-26 DIAGNOSIS — R0789 Other chest pain: Secondary | ICD-10-CM | POA: Diagnosis not present

## 2014-09-26 NOTE — Patient Instructions (Addendum)
Medication Instructions: - none  Labwork: - none  Procedures/Testing: - Your physician has requested that you have an echocardiogram. Echocardiography is a painless test that uses sound waves to create images of your heart. It provides your doctor with information about the size and shape of your heart and how well your heart's chambers and valves are working. This procedure takes approximately one hour. There are no restrictions for this procedure.  - Your physician has requested that you have an exercise stress myoview. For further information please visit [email or web address]. Please follow instruction sheet, as given.  Follow-Up: - 2 months with Dr. Acie Fredrickson  Any Additional Special Instructions Will Be Listed Below (If Applicable). - none     [facility] MYOVIEW  Your caregiver has ordered a Stress Test with nuclear imaging. The purpose of this test is to evaluate the blood supply to your heart muscle. This procedure is referred to as a "Non-Invasive Stress Test." This is because other than having an IV started in your vein, nothing is inserted or "invades" your body. Cardiac stress tests are done to find areas of poor blood flow to the heart by determining the extent of coronary artery disease (CAD). Some patients exercise on a treadmill, which naturally increases the blood flow to your heart, while others who are  unable to walk on a treadmill due to physical limitations have a pharmacologic/chemical stress agent called Lexiscan . This medicine will mimic walking on a treadmill by temporarily increasing your coronary blood flow.   Please note: these test may take anywhere between 2-4 hours to complete  PLEASE REPORT TO [facility] AT THE FIRST DESK WILL DIRECT YOU WHERE TO GO  Date of Procedure:___________Monday 4/25/16__________________________  Arrival Time for Procedure:_________7:45 am_____________________  Instructions regarding medication:   ____ :  Hold diabetes medication morning of procedure  ____:  Hold betablocker(s) night before procedure and morning of procedure  ____:  Hold other medications as follows:_________________________________________________________________________________________________________________________________________________________________________________________________________________________________________________________________________________________  PLEASE NOTIFY THE OFFICE AT LEAST 24 HOURS IN ADVANCE IF YOU ARE UNABLE TO KEEP YOUR APPOINTMENT.  [phone] AND  PLEASE NOTIFY NUCLEAR MEDICINE AT [facility] AT LEAST 24 HOURS IN ADVANCE IF YOU ARE UNABLE TO KEEP YOUR APPOINTMENT. [phone]  How to prepare for your Myoview test:  1. Do not eat or drink after midnight 2. No caffeine for 24 hours prior to test 3. No smoking 24 hours prior to test. 4. Your medication may be taken with water.  If your doctor stopped a medication because of this test, do not take that medication. 5. Ladies, please do not wear dresses.  Skirts or pants are appropriate. Please wear a short sleeve shirt. 6. No perfume, cologne or lotion. 7. Wear comfortable walking shoes. No heels!

## 2014-09-26 NOTE — Progress Notes (Signed)
Cardiology Office Note   Date:  09/26/2014   ID:  Debra Leach, DOB 11-04-1950, MRN [id number]  PCP:  Eliezer Lofts, MD  Cardiologist:   Thayer Headings, MD   Chief Complaint  Patient presents with  . other    Per pcp c/o chest pain. Pt mentioned brother Dx leaky valve and aortic valve. Meds reviewed verbally with pt.   1. Hyperlipidemia 2. Diabetes mellitus 3. Family history of aortic valve disease and aortic aneurysms 4. Mitral valve prolapse 5. Arthroscopic knee surgery ( left knee, July 2015)      History of Present Illness: Debra Leach is a 64 y.o. female who presents for evaluation of some chest pain. She has a family history of aortic valve disease. There is also a history of aneurysms.  She reports several episodes of CP.  Back in December, she had several episodes.  Described as a band like discomfort across her lower chest .  Jabbing pain would last several seconds. The band like chest pain may last 30 minutes.   Also has a pressure like sensation.  Not associated with any specific activity.  Not brought on by riding her stationary bike.      Past Medical History  Diagnosis Date  . Arthritis     osteoarthritis B knees, left hip , right elbow  . Hyperlipidemia   . GERD (gastroesophageal reflux disease)   . MVP (mitral valve prolapse)     history of    Past Surgical History  Procedure Laterality Date  . Tubal ligation  1980  . Breast surgery  2000    breast biopsy (benign)  . Partial hysterectomy      Both ovaries remain, vaginal, for mennorhagia  . Tonsillectomy    . Lithotripsy  08-12-2008    stent placed bilaterally  . Arm surgery       Current Outpatient Prescriptions  Medication Sig Dispense Refill  . atorvastatin (LIPITOR) 80 MG tablet TAKE 1/2 TABLET BY MOUTH DAILY    . clotrimazole-betamethasone (LOTRISONE) cream Apply 1 application topically as needed. Please given in 2 x 15 gm tubes 30 g 1  . diclofenac sodium (VOLTAREN) 1 % GEL  Apply 2 g topically 4 (four) times daily.    Marland Kitchen gabapentin (NEURONTIN) 300 MG capsule Take 300 mg by mouth 3 (three) times daily as needed. At bedtime and as needed    . Glucosamine-Chondroitin 500-400 MG CAPS Take 1 capsule by mouth every morning.    . indapamide (LOZOL) 1.25 MG tablet TAKE ONE TABLET BY MOUTH EVERY MORNING 90 tablet 3  . Magnesium 400 MG TABS Take 2 tablets by mouth daily.     . meclizine (ANTIVERT) 25 MG tablet Take 25-50 mg by mouth as needed for dizziness.    Marland Kitchen omeprazole (PRILOSEC) 40 MG capsule TAKE ONE CAPSULE BY MOUTH DAILY (Patient taking differently: as needed) 30 capsule 5  . ondansetron (ZOFRAN) 4 MG tablet Take 4 mg by mouth as needed for nausea.    . Petasin 7.5 MG CAPS Take by mouth 2 (two) times daily.    . SUMAtriptan (IMITREX) 100 MG tablet Take 100 mg by mouth as needed.     . Temazepam (RESTORIL PO) Take by mouth. As needed for sleep    . Vitamin D, Ergocalciferol, (DRISDOL) 50000 UNITS CAPS capsule Take 1 capsule (50,000 Units total) by mouth every 7 (seven) days. 12 capsule 0  . WELCHOL 625 MG tablet TAKE 3 TABLETS BY MOUTH TWO TIMES  A DAY WITH MEALS 180 tablet 5   No current facility-administered medications for this visit.    Allergies:   Codeine; Penicillins; Pentazocine lactate; and Crestor    Social History:  The patient  reports that she has quit smoking. She has never used smokeless tobacco. She reports that she drinks alcohol. She reports that she does not use illicit drugs.   Family History:  The patient's family history includes AAA (abdominal aortic aneurysm) in her brother; Arthritis in her brother and brother; Asthma in her brother and brother; Cancer in her mother; Hypertension in her father; Mitral valve prolapse in her father; Nephrolithiasis in her brother and brother.    ROS:  Please see the history of present illness.    Review of Systems: Constitutional:  denies fever, chills, diaphoresis, appetite change and fatigue.  HEENT:  denies photophobia, eye pain, redness, hearing loss, ear pain, congestion, sore throat, rhinorrhea, sneezing, neck pain, neck stiffness and tinnitus.  Respiratory: denies SOB, DOE, cough, chest tightness, and wheezing.  Cardiovascular: admits to chest pain,  Denies any  palpitations and leg swelling.  Gastrointestinal: denies nausea, vomiting, abdominal pain, diarrhea, constipation, blood in stool.  Genitourinary: denies dysuria, urgency, frequency, hematuria, flank pain and difficulty urinating.  Musculoskeletal: denies  myalgias, back pain, joint swelling, arthralgias and gait problem.   Skin: denies pallor, rash and wound.  Neurological: denies dizziness, seizures, syncope, weakness, light-headedness, numbness and headaches.   Hematological: denies adenopathy, easy bruising, personal or family bleeding history.  Psychiatric/ Behavioral: denies suicidal ideation, mood changes, confusion, nervousness, sleep disturbance and agitation.       All other systems are reviewed and negative.    PHYSICAL EXAM: VS:  BP 120/72 mmHg  Pulse 90  Ht 5' (1.524 m)  Wt 147 lb 12 oz (67.019 kg)  BMI 28.86 kg/m2 , BMI Body mass index is 28.86 kg/(m^2). GEN: Well nourished, well developed, in no acute distress HEENT: normal Neck: no JVD, carotid bruits, or masses Cardiac: RRR; no murmurs, rubs, or gallops,no edema  Respiratory:  clear to auscultation bilaterally, normal work of breathing GI: soft, nontender, nondistended, + BS MS: no deformity or atrophy Skin: warm and dry, no rash Neuro:  Strength and sensation are intact Psych: normal   EKG:  EKG is ordered today. The ekg ordered today demonstrates NSR at 90.  No ST or T wave changes.     Recent Labs: 08/14/2014: ALT 34; BUN 16; Creatinine 0.82; Potassium 4.0; Sodium 140    Lipid Panel    Component Value Date/Time   CHOL 239* 08/14/2014 0758   TRIG 101.0 08/14/2014 0758   HDL 65.70 08/14/2014 0758   CHOLHDL 4 08/14/2014 0758   VLDL  20.2 08/14/2014 0758   LDLCALC 153* 08/14/2014 0758   LDLDIRECT 160.8 01/29/2013 0840      Wt Readings from Last 3 Encounters:  09/26/14 147 lb 12 oz (67.019 kg)  08/20/14 146 lb 4 oz (66.339 kg)  07/24/14 145 lb (65.772 kg)      Other studies Reviewed: Additional studies/ records that were reviewed today include:  Records from primary medical doctor Review of the above records demonstrates:  Hyperlipidemia,  LDL of 150s.     ASSESSMENT AND PLAN:  1.  Chest tightness: The patient presents with some vague episodes of chest tightness. She has several episodes of chest pain which he describes some jabbing chest pain which sounds noncardiac. She does describe some bandlike chest tightness that she has on occasion. These actually last for  about 30 minutes and are concerning for coronary artery disease. She has a history of hyperlipidemia that is poorly controlled because of her intolerance to statins.  We'll schedule her for a stress Myoview study at [facility]. We'll also schedule her for an echocardiogram in our [place] office. I will see her again in follow-up in approximately 2 months. I've advised her to call 911 if she has any severe episodes of chest tightness that do not resolve fairly quickly.  2. Hyperlipidemia: The patient has had elevated cholesterol levels she's tried Crestor but did not tolerate it. She's been on atorvastatin 80 mg a day but had some muscle aches and pains. She's now on 40 mg a day which she seems to tolerate. Her last lipid levels are moderately elevated. Chol = 239 LDL = 153.  If she is found to have CAD, we will encourage her to try the Atorvastatin 80 mg again.    3. Family hx of aortic valve disease and  aortic aneurism:  Will get an echo.   I do not hear any aortic valve murmur.  She has no pulsitile mass to suggest an abdominal aortic aneurism.     Current medicines are reviewed at length with the patient today.  The patient does not have concerns  regarding medicines.  The following changes have been made:  no change  Labs/ tests ordered today include:  Orders Placed This Encounter  Procedures  . Myocardial Perfusion Imaging  . EKG 12-Lead  . 2D Echocardiogram without contrast     Disposition:   FU with me in  2 months .     Signed, Nahser, Wonda Cheng, MD  09/26/2014 8:44 AM    [facility] [address], [place], [place]  [address] Phone: [phone]; Fax: [phone]

## 2014-09-27 ENCOUNTER — Ambulatory Visit (HOSPITAL_COMMUNITY): Payer: 59 | Attending: Cardiology | Admitting: Radiology

## 2014-09-27 DIAGNOSIS — E785 Hyperlipidemia, unspecified: Secondary | ICD-10-CM | POA: Diagnosis not present

## 2014-09-27 DIAGNOSIS — R079 Chest pain, unspecified: Secondary | ICD-10-CM | POA: Insufficient documentation

## 2014-09-27 DIAGNOSIS — E119 Type 2 diabetes mellitus without complications: Secondary | ICD-10-CM | POA: Diagnosis not present

## 2014-09-27 NOTE — Progress Notes (Signed)
Echocardiogram performed.  

## 2014-09-30 ENCOUNTER — Ambulatory Visit
Admit: 2014-09-30 | Disposition: A | Discharge status: Home / Self Care | Payer: Self-pay | Attending: Cardiovascular Disease | Admitting: Cardiovascular Disease

## 2014-09-30 DIAGNOSIS — R079 Chest pain, unspecified: Secondary | ICD-10-CM | POA: Diagnosis not present

## 2014-10-01 ENCOUNTER — Other Ambulatory Visit: Payer: Self-pay

## 2014-10-01 DIAGNOSIS — R079 Chest pain, unspecified: Secondary | ICD-10-CM

## 2014-10-09 ENCOUNTER — Encounter: Payer: Self-pay | Admitting: Cardiovascular Disease

## 2014-11-02 ENCOUNTER — Other Ambulatory Visit: Payer: Self-pay | Admitting: Family Medicine

## 2014-12-02 ENCOUNTER — Other Ambulatory Visit: Payer: Self-pay

## 2014-12-05 ENCOUNTER — Ambulatory Visit (INDEPENDENT_AMBULATORY_CARE_PROVIDER_SITE_OTHER): Payer: 59 | Admitting: Cardiovascular Disease

## 2014-12-05 ENCOUNTER — Encounter: Payer: Self-pay | Admitting: Cardiovascular Disease

## 2014-12-05 ENCOUNTER — Other Ambulatory Visit: Payer: Self-pay

## 2014-12-05 ENCOUNTER — Encounter (INDEPENDENT_AMBULATORY_CARE_PROVIDER_SITE_OTHER): Payer: Self-pay

## 2014-12-05 VITALS — BP 122/78 | HR 99 | Ht 61.5 in | Wt 144.2 lb

## 2014-12-05 DIAGNOSIS — Z8249 Family history of ischemic heart disease and other diseases of the circulatory system: Secondary | ICD-10-CM

## 2014-12-05 DIAGNOSIS — E785 Hyperlipidemia, unspecified: Secondary | ICD-10-CM | POA: Diagnosis not present

## 2014-12-05 DIAGNOSIS — R0789 Other chest pain: Secondary | ICD-10-CM | POA: Diagnosis not present

## 2014-12-05 DIAGNOSIS — Z1231 Encounter for screening mammogram for malignant neoplasm of breast: Secondary | ICD-10-CM

## 2014-12-05 NOTE — Progress Notes (Signed)
Cardiology Office Note   Date:  12/05/2014   ID:  JACQLYN MAROLF, DOB 1950-06-19, MRN [id number]  PCP:  Eliezer Lofts, MD  Cardiologist:   Acie Fredrickson Wonda Cheng, MD   Chief Complaint  Patient presents with  . other    Follow up from echo and stress test. Meds reviewed by the patient verbally. "doing well."    1. Hyperlipidemia 2. Diabetes mellitus 3. Family history of aortic valve disease and aortic aneurysms 4. Mitral valve prolapse 5. Arthroscopic knee surgery ( left knee, July 2015)      History of Present Illness: Debra Leach is a 64 y.o. female who presents for evaluation of some chest pain. She has a family history of aortic valve disease. There is also a history of aneurysms.  She reports several episodes of CP.  Back in December, she had several episodes.  Described as a band like discomfort across her lower chest .  Jabbing pain would last several seconds. The band like chest pain may last 30 minutes.   Also has a pressure like sensation.  Not associated with any specific activity.  Not brought on by riding her stationary bike.    12/05/2014:  Arantxa was seen several months ago for episodes of chest discomfort. An echocardiogram and stress Myoview study were ordered. Echo: Left ventricle: The cavity size was normal. Systolic function was vigorous. The estimated ejection fraction was in the range of 65% to 70%. Wall motion was normal; there were no regional wall motion abnormalities. Doppler parameters are consistent with abnormal left ventricular relaxation (grade 1 diastolic dysfunction). There was no evidence of elevated ventricular filling pressure by Doppler parameters. - Aortic valve: There was mild regurgitation. - Aortic root: The aortic root was normal in size. - Mitral valve: Mildly thickened leaflets . There was trivial regurgitation. - Left atrium: The atrium was normal in size. - Right ventricle: Systolic function was normal. - Right  atrium: The atrium was normal in size. - Tricuspid valve: There was trivial regurgitation. - Pulmonic valve: There was no regurgitation. - Pulmonary arteries: Systolic pressure was within the normal range. - Inferior vena cava: The vessel was normal in size. - Pericardium, extracardiac: There was no pericardial effusion  Myoview: Normal - no ischemia. EF = 67%  Active , doing everything  That she wants.   She and her husband work actively in their garden.  No limitation .  No CP , no dyspnea    Past Medical History  Diagnosis Date  . Arthritis     osteoarthritis B knees, left hip , right elbow  . Hyperlipidemia   . GERD (gastroesophageal reflux disease)   . MVP (mitral valve prolapse)     history of    Past Surgical History  Procedure Laterality Date  . Tubal ligation  1980  . Breast surgery  2000    breast biopsy (benign)  . Partial hysterectomy      Both ovaries remain, vaginal, for mennorhagia  . Tonsillectomy    . Lithotripsy  08-12-2008    stent placed bilaterally  . Arm surgery       Current Outpatient Prescriptions  Medication Sig Dispense Refill  . atorvastatin (LIPITOR) 80 MG tablet TAKE 1 TABLET BY MOUTH DAILY (Patient taking differently: 1/2 tablet a day) 90 tablet 1  . clotrimazole-betamethasone (LOTRISONE) cream Apply 1 application topically as needed. Please given in 2 x 15 gm tubes 30 g 1  . gabapentin (NEURONTIN) 300 MG capsule Take 300 mg  by mouth 3 (three) times daily as needed. At bedtime and as needed    . indapamide (LOZOL) 1.25 MG tablet TAKE ONE TABLET BY MOUTH EVERY MORNING 90 tablet 3  . Magnesium 400 MG TABS Take 2 tablets by mouth daily.     . meclizine (ANTIVERT) 25 MG tablet Take 25-50 mg by mouth as needed for dizziness.    Marland Kitchen omeprazole (PRILOSEC) 40 MG capsule TAKE ONE CAPSULE BY MOUTH DAILY (Patient taking differently: as needed) 30 capsule 5  . ondansetron (ZOFRAN) 4 MG tablet Take 4 mg by mouth as needed for nausea.    . Petasin  (PETADOLEX PO) Take by mouth daily.    . SUMAtriptan (IMITREX) 100 MG tablet Take 100 mg by mouth as needed.     . Temazepam (RESTORIL PO) Take by mouth. As needed for sleep    . WELCHOL 625 MG tablet TAKE 3 TABLETS BY MOUTH TWO TIMES A DAY WITH MEALS 180 tablet 5   No current facility-administered medications for this visit.    Allergies:   Codeine; Penicillins; Pentazocine lactate; and Crestor    Social History:  The patient  reports that she has quit smoking. She has never used smokeless tobacco. She reports that she drinks alcohol. She reports that she does not use illicit drugs.   Family History:  The patient's family history includes AAA (abdominal aortic aneurysm) in her brother; Arthritis in her brother and brother; Asthma in her brother and brother; Cancer in her mother; Hypertension in her father; Mitral valve prolapse in her father; Nephrolithiasis in her brother and brother.    ROS:  Please see the history of present illness.    Review of Systems: Constitutional:  denies fever, chills, diaphoresis, appetite change and fatigue.  HEENT: denies photophobia, eye pain, redness, hearing loss, ear pain, congestion, sore throat, rhinorrhea, sneezing, neck pain, neck stiffness and tinnitus.  Respiratory: denies SOB, DOE, cough, chest tightness, and wheezing.  Cardiovascular: admits to chest pain,  Denies any  palpitations and leg swelling.  Gastrointestinal: denies nausea, vomiting, abdominal pain, diarrhea, constipation, blood in stool.  Genitourinary: denies dysuria, urgency, frequency, hematuria, flank pain and difficulty urinating.  Musculoskeletal: denies  myalgias, back pain, joint swelling, arthralgias and gait problem.   Skin: denies pallor, rash and wound.  Neurological: denies dizziness, seizures, syncope, weakness, light-headedness, numbness and headaches.   Hematological: denies adenopathy, easy bruising, personal or family bleeding history.  Psychiatric/ Behavioral:  denies suicidal ideation, mood changes, confusion, nervousness, sleep disturbance and agitation.       All other systems are reviewed and negative.    PHYSICAL EXAM: VS:  BP 122/78 mmHg  Pulse 99  Ht 5' 1.5" (1.562 m)  Wt 65.431 kg (144 lb 4 oz)  BMI 26.82 kg/m2 , BMI Body mass index is 26.82 kg/(m^2). GEN: Well nourished, well developed, in no acute distress HEENT: normal Neck: no JVD, carotid bruits, or masses Cardiac: RRR; no murmurs, rubs, or gallops,no edema  Respiratory:  clear to auscultation bilaterally, normal work of breathing GI: soft, nontender, nondistended, + BS MS: no deformity or atrophy Skin: warm and dry, no rash Neuro:  Strength and sensation are intact Psych: normal   EKG:  EKG is not ordered today.     Recent Labs: 08/14/2014: ALT 34; BUN 16; Creatinine, Ser 0.82; Potassium 4.0; Sodium 140    Lipid Panel    Component Value Date/Time   CHOL 239* 08/14/2014 0758   TRIG 101.0 08/14/2014 0758   HDL 65.70 08/14/2014  0758   CHOLHDL 4 08/14/2014 0758   VLDL 20.2 08/14/2014 0758   LDLCALC 153* 08/14/2014 0758   LDLDIRECT 160.8 01/29/2013 0840      Wt Readings from Last 3 Encounters:  12/05/14 65.431 kg (144 lb 4 oz)  09/26/14 67.019 kg (147 lb 12 oz)  08/20/14 66.339 kg (146 lb 4 oz)      Other studies Reviewed: Additional studies/ records that were reviewed today include:  Records from primary medical doctor Review of the above records demonstrates:  Hyperlipidemia,  LDL of 150s.     ASSESSMENT AND PLAN:  1.  Chest tightness: The patient presents with some vague episodes of chest tightness. Her Myoview study was negative for skin EP she's not had any recurrent episodes of chest discomfort. We will continue to follow. Reck a cardia gram was also normal.  2. Hyperlipidemia: The patient has had elevated cholesterol levels she's tried Crestor but did not tolerate it. She's been on atorvastatin 80 mg a day but had some muscle aches and pains.  She's now on 40 mg a day which she seems to tolerate. Her last lipid levels are moderately elevated. Chol = 239 LDL = 153.  , we will encourage her to try the Atorvastatin 80 mg again.    We'll check her lipids again in one year.  3. Family hx of aortic valve disease and  aortic aneurism:  She has mild aortic insufficiency by echo.  She also has trivial MR and trivial TR. Her aortic root did not look enlarged. We will repeat her echocardiogram in approximate 4-5 years.   Current medicines are reviewed at length with the patient today.  The patient does not have concerns regarding medicines.  The following changes have been made:  no change  Labs/ tests ordered today include:  Orders Placed This Encounter  Procedures  . Myocardial Perfusion Imaging  . EKG 12-Lead  . 2D Echocardiogram without contrast     Disposition:   FU with me in  1 year      Signed, Abdi Husak, Wonda Cheng, MD  12/05/2014 8:43 AM    [facility] [place], [place], [place]  [address] Phone: [phone]; Fax: [phone]

## 2014-12-05 NOTE — Patient Instructions (Signed)
Medication Instructions: - no changes  Labwork: - Your physician recommends that you return for FASTING lab work in: 1 year- lipid/ liver/ bmp  Procedures/Testing: - none  Follow-Up: - Your physician wants you to follow-up in: 1 year with Dr. Acie Fredrickson (June 2017). You will receive a reminder letter in the mail two months in advance. If you don't receive a letter, please call our office to schedule the follow-up appointment.  Any Additional Special Instructions Will Be Listed Below (If Applicable).

## 2014-12-06 ENCOUNTER — Encounter: Payer: Self-pay | Admitting: Cardiovascular Disease

## 2015-01-02 ENCOUNTER — Other Ambulatory Visit: Payer: Self-pay | Admitting: Family Medicine

## 2015-01-03 ENCOUNTER — Encounter: Payer: Self-pay | Admitting: Cardiovascular Disease

## 2015-01-07 ENCOUNTER — Encounter: Payer: Self-pay | Admitting: Family Medicine

## 2015-01-08 ENCOUNTER — Other Ambulatory Visit: Payer: Self-pay | Admitting: Nurse Practitioner

## 2015-01-08 MED ORDER — METOPROLOL SUCCINATE ER 25 MG PO TB24: 25.0000 mg | ORAL_TABLET | Freq: Every day | ORAL | Status: DC

## 2015-01-22 ENCOUNTER — Ambulatory Visit: Payer: 59

## 2015-01-24 ENCOUNTER — Ambulatory Visit
Admission: RE | Admit: 2015-01-24 | Discharge: 2015-01-24 | Disposition: A | Discharge status: Home / Self Care | Payer: 59 | Source: Ambulatory Visit

## 2015-01-24 DIAGNOSIS — Z1231 Encounter for screening mammogram for malignant neoplasm of breast: Secondary | ICD-10-CM

## 2015-02-18 ENCOUNTER — Telehealth: Payer: Self-pay | Admitting: Family Medicine

## 2015-02-18 ENCOUNTER — Other Ambulatory Visit (INDEPENDENT_AMBULATORY_CARE_PROVIDER_SITE_OTHER): Payer: 59

## 2015-02-18 DIAGNOSIS — E119 Type 2 diabetes mellitus without complications: Secondary | ICD-10-CM

## 2015-02-18 DIAGNOSIS — E559 Vitamin D deficiency, unspecified: Secondary | ICD-10-CM

## 2015-02-18 LAB — COMPREHENSIVE METABOLIC PANEL
ALT: 38 U/L — ABNORMAL HIGH (ref 0–35)
AST: 25 U/L (ref 0–37)
Albumin: 3.9 g/dL (ref 3.5–5.2)
Alkaline Phosphatase: 118 U/L — ABNORMAL HIGH (ref 39–117)
BUN: 17 mg/dL (ref 6–23)
CO2: 32 mEq/L (ref 19–32)
Calcium: 9.3 mg/dL (ref 8.4–10.5)
Chloride: 102 mEq/L (ref 96–112)
Creatinine, Ser: 0.78 mg/dL (ref 0.40–1.20)
GFR: 79.08 mL/min (ref >60.00)
Glucose, Bld: 111 mg/dL — ABNORMAL HIGH (ref 70–99)
Potassium: 4 mEq/L (ref 3.5–5.1)
Sodium: 142 mEq/L (ref 135–145)
Total Bilirubin: 0.5 mg/dL (ref 0.2–1.2)
Total Protein: 6.6 g/dL (ref 6.0–8.3)

## 2015-02-18 LAB — LIPID PANEL
Cholesterol: 242 mg/dL — ABNORMAL HIGH (ref 0–200)
HDL: 60.6 mg/dL (ref >39.00)
LDL Cholesterol: 144 mg/dL — ABNORMAL HIGH (ref 0–99)
NonHDL: 181.08
Total CHOL/HDL Ratio: 4
Triglycerides: 185 mg/dL — ABNORMAL HIGH (ref 0.0–149.0)
VLDL: 37 mg/dL (ref 0.0–40.0)

## 2015-02-18 LAB — HEMOGLOBIN A1C: Hgb A1c MFr Bld: 6.4 % (ref 4.6–6.5)

## 2015-02-18 LAB — MICROALBUMIN / CREATININE URINE RATIO
Creatinine,U: 81.7 mg/dL
Microalb Creat Ratio: 0.9 mg/g (ref 0.0–30.0)
Microalb, Ur: <0.7 mg/dL (ref 0.0–1.9)

## 2015-02-18 NOTE — Telephone Encounter (Signed)
-----   Message from Marchia Bond sent at 02/12/2015  3:24 PM EDT ----- Regarding: Cpx labs 9/13, need orders. Thanks! :-) Please order  future cpx labs for pt's upcoming lab appt. Thanks Aniceto Boss

## 2015-02-21 ENCOUNTER — Ambulatory Visit (INDEPENDENT_AMBULATORY_CARE_PROVIDER_SITE_OTHER): Payer: 59 | Admitting: Family Medicine

## 2015-02-21 ENCOUNTER — Encounter: Payer: Self-pay | Admitting: Family Medicine

## 2015-02-21 VITALS — BP 106/70 | HR 76 | Temp 98.6°F | Ht 60.5 in | Wt 143.2 lb

## 2015-02-21 DIAGNOSIS — E785 Hyperlipidemia, unspecified: Secondary | ICD-10-CM | POA: Diagnosis not present

## 2015-02-21 DIAGNOSIS — Z23 Encounter for immunization: Secondary | ICD-10-CM | POA: Diagnosis not present

## 2015-02-21 DIAGNOSIS — E119 Type 2 diabetes mellitus without complications: Secondary | ICD-10-CM | POA: Diagnosis not present

## 2015-02-21 DIAGNOSIS — Z0000 Encounter for general adult medical examination without abnormal findings: Secondary | ICD-10-CM

## 2015-02-21 LAB — HM DIABETES FOOT EXAM

## 2015-02-21 MED ORDER — CLOTRIMAZOLE-BETAMETHASONE 1-0.05 % EX CREA: 1.0000 "application " | TOPICAL_CREAM | CUTANEOUS | Status: DC | PRN

## 2015-02-21 NOTE — Assessment & Plan Note (Signed)
Improved LDL almost at goal < 100 on atorvastatin. May need to increase if not at goal at next OV.

## 2015-02-21 NOTE — Progress Notes (Signed)
Pre visit review using our clinic review tool, if applicable. No additional management support is needed unless otherwise documented below in the visit note. 

## 2015-02-21 NOTE — Progress Notes (Signed)
The patient is here for annual wellness exam and preventative care.   She is doing well overall.   She is being treated for migraines by neurologist. She has had intermittent vision, lightheadedness changes with migranes. He migraines have been better controlled in last few weeks.  Saw Dr. Alinda Money in past for kidney stones.  On indapamide.  We will follow here and prescribe medication.  Needs yearly BMET.    Diabetes: Improved from last year on lower carb diet.  Lab Results  Component Value Date   HGBA1C 6.4 02/18/2015  Hypoglycemic episodes:not checking Hyperglycemic episodes: Feet problems:none Blood Sugars averaging: eye exam within last year:  yes  Elevated Cholesterol: Worsening control on atorvastain 40 mg daily. In past zetia ineffective, LDLnot at goal <130. On welchol as well. Lab Results  Component Value Date   CHOL 242* 02/18/2015   HDL 60.60 02/18/2015   LDLCALC 144* 02/18/2015   LDLDIRECT 160.8 01/29/2013   TRIG 185.0* 02/18/2015   CHOLHDL 4 02/18/2015  Using medications without problems: no  Muscle aches: No  Other complaints:   BP well controlled.  BP Readings from Last 3 Encounters:  02/21/15 106/70  12/05/14 122/78  09/26/14 120/72   Exercise: Walking few days a week or stationary bike. Diet: Moderate. Limited with migraine triggers Wt Readings from Last 3 Encounters:  02/21/15 143 lb 4 oz (64.978 kg)  12/05/14 144 lb 4 oz (65.431 kg)  09/26/14 147 lb 12 oz (67.019 kg)  Body mass index is 27.51 kg/(m^2).   Social History /Family History/Past Medical History reviewed and updated if needed.  Review of Systems  Constitutional: Positive for fatigue. Negative for fever and unexpected weight change.  Associated with migraines  HENT: Negative for ear pain, congestion, sore throat, sneezing, trouble swallowing and sinus pressure.  Eyes: Negative for pain and itching.  Respiratory: Negative for cough, shortness of breath and wheezing.   Cardiovascular: Negative for chest pain, palpitations and leg swelling.  Gastrointestinal: Negative for nausea, diarrhea, constipation and blood in stool.  Genitourinary: Negative for dysuria, hematuria, vaginal discharge, difficulty urinating and menstrual problem.  Skin: Negative for rash.  Neurological: Positive for headaches. Negative for syncope, weakness, light-headedness and numbness.  Migraines  Psychiatric/Behavioral: Negative for confusion and dysphoric mood. The patient is not nervous/anxious.  Objective:   Physical Exam  Constitutional: Vital signs are normal. She appears well-developed and well-nourished. She is cooperative. Non-toxic appearance. She does not appear ill. No distress.  HENT:  Head: Normocephalic.  Right Ear: Hearing, tympanic membrane, external ear and ear canal normal.  Left Ear: Hearing, tympanic membrane, external ear and ear canal normal.  Nose: Nose normal.  Eyes: Conjunctivae normal, EOM and lids are normal. Pupils are equal, round, and reactive to light. No foreign bodies found.  Neck: Trachea normal and normal range of motion. Neck supple. Carotid bruit is not present. No mass and no thyromegaly present.  Cardiovascular: Normal rate, regular rhythm, S1 normal, S2 normal, normal heart sounds and intact distal pulses. Exam reveals no gallop.  No murmur heard.  Pulmonary/Chest: Effort normal and breath sounds normal. No respiratory distress. She has no wheezes. She has no rhonchi. She has no rales.  Abdominal: Soft. Normal appearance and bowel sounds are normal. She exhibits no distension, no fluid wave, no abdominal bruit and no mass. There is no hepatosplenomegaly. There is no tenderness. There is no rebound, no guarding and no CVA tenderness. No hernia.  Genitourinary: Vagina normal and uterus normal. No breast swelling, tenderness, discharge or  bleeding. Pelvic exam was performed with patient supine. There is no tenderness or lesion on the  right labia. There is no tenderness or lesion on the left labia. Uterus is not enlarged and not tender. Right adnexum displays no mass, no tenderness and no fullness. Left adnexum displays no mass, no tenderness and no fullness.  Lymphadenopathy:  She has no cervical adenopathy.  She has no axillary adenopathy.  Neurological: She is alert. She has normal strength. No cranial nerve deficit or sensory deficit.  Skin: Skin is warm, dry and intact. No rash noted.  Psychiatric: Her speech is normal and behavior is normal. Judgment normal. Her mood appears not anxious. Cognition and memory are normal. She does not exhibit a depressed mood.  Assessment & Plan:   The patient's preventative maintenance and recommended screening tests for an annual wellness exam were reviewed in full today.  Brought up to date unless services declined.  Counselled on the importance of diet, exercise, and its role in overall health and mortality.  The patient's FH and SH was reviewed, including their home life, tobacco status, and drug and alcohol status.   Colonoscopy was normal in 2014, repeat in 10 years.  Nml endoscopy  For GI bleed in 12/2014. PAP last 2015 then every 3 years, DVE yearly. Flu given, Td, Shingles uptodate. Will given PNA vaccines given DM dx now. Last DEXA Stable osteopenia, nml vit D. 02/2013 repeat in 5 years Mammo: nml 01/2015

## 2015-02-21 NOTE — Addendum Note (Signed)
Addended by: Carter Kitten on: 02/21/2015 10:48 AM   Modules accepted: Orders

## 2015-02-21 NOTE — Assessment & Plan Note (Signed)
Improved control with diet and exercise.

## 2015-02-21 NOTE — Addendum Note (Signed)
Addended by: Carter Kitten on: 02/21/2015 10:50 AM   Modules accepted: Orders, Medications

## 2015-02-21 NOTE — Patient Instructions (Signed)
Keep up great work on healthy eating and exercise.

## 2015-03-01 ENCOUNTER — Other Ambulatory Visit: Payer: Self-pay | Admitting: Family Medicine

## 2015-05-03 ENCOUNTER — Other Ambulatory Visit: Payer: Self-pay | Admitting: Family Medicine

## 2015-07-24 ENCOUNTER — Ambulatory Visit
Admission: RE | Admit: 2015-07-24 | Discharge: 2015-07-24 | Disposition: A | Discharge status: Home / Self Care | Payer: 59 | Source: Ambulatory Visit | Attending: Orthopaedic Surgery | Admitting: Orthopaedic Surgery

## 2015-07-24 ENCOUNTER — Other Ambulatory Visit: Payer: Self-pay | Admitting: Orthopaedic Surgery

## 2015-07-24 DIAGNOSIS — M25562 Pain in left knee: Secondary | ICD-10-CM

## 2015-07-29 ENCOUNTER — Other Ambulatory Visit: Payer: 59

## 2015-07-30 ENCOUNTER — Encounter: Payer: Self-pay | Admitting: Family Medicine

## 2015-07-30 DIAGNOSIS — IMO0002 Reserved for concepts with insufficient information to code with codable children: Secondary | ICD-10-CM

## 2015-07-30 DIAGNOSIS — G43709 Chronic migraine without aura, not intractable, without status migrainosus: Secondary | ICD-10-CM

## 2015-08-02 ENCOUNTER — Other Ambulatory Visit: Payer: 59

## 2015-08-19 ENCOUNTER — Other Ambulatory Visit: Payer: Self-pay | Admitting: Family Medicine

## 2015-08-19 ENCOUNTER — Telehealth: Payer: Self-pay | Admitting: Family Medicine

## 2015-08-19 ENCOUNTER — Other Ambulatory Visit (INDEPENDENT_AMBULATORY_CARE_PROVIDER_SITE_OTHER): Payer: 59

## 2015-08-19 DIAGNOSIS — E785 Hyperlipidemia, unspecified: Secondary | ICD-10-CM | POA: Diagnosis not present

## 2015-08-19 DIAGNOSIS — E119 Type 2 diabetes mellitus without complications: Secondary | ICD-10-CM

## 2015-08-19 DIAGNOSIS — E559 Vitamin D deficiency, unspecified: Secondary | ICD-10-CM

## 2015-08-19 DIAGNOSIS — Z1159 Encounter for screening for other viral diseases: Secondary | ICD-10-CM

## 2015-08-19 LAB — COMPREHENSIVE METABOLIC PANEL
ALT: 30 U/L (ref 0–35)
AST: 25 U/L (ref 0–37)
Albumin: 4 g/dL (ref 3.5–5.2)
Alkaline Phosphatase: 83 U/L (ref 39–117)
BUN: 19 mg/dL (ref 6–23)
CO2: 29 mEq/L (ref 19–32)
Calcium: 9.5 mg/dL (ref 8.4–10.5)
Chloride: 100 mEq/L (ref 96–112)
Creatinine, Ser: 0.91 mg/dL (ref 0.40–1.20)
GFR: 66.09 mL/min (ref >60.00)
Glucose, Bld: 113 mg/dL — ABNORMAL HIGH (ref 70–99)
Potassium: 3.6 mEq/L (ref 3.5–5.1)
Sodium: 140 mEq/L (ref 135–145)
Total Bilirubin: 0.5 mg/dL (ref 0.2–1.2)
Total Protein: 7.3 g/dL (ref 6.0–8.3)

## 2015-08-19 LAB — LIPID PANEL
Cholesterol: 215 mg/dL — ABNORMAL HIGH (ref 0–200)
HDL: 57.6 mg/dL (ref >39.00)
LDL Cholesterol: 135 mg/dL — ABNORMAL HIGH (ref 0–99)
NonHDL: 157.2
Total CHOL/HDL Ratio: 4
Triglycerides: 110 mg/dL (ref 0.0–149.0)
VLDL: 22 mg/dL (ref 0.0–40.0)

## 2015-08-19 LAB — HEMOGLOBIN A1C: Hgb A1c MFr Bld: 6.8 % — ABNORMAL HIGH (ref 4.6–6.5)

## 2015-08-19 LAB — VITAMIN D 25 HYDROXY (VIT D DEFICIENCY, FRACTURES): VITD: 20.65 ng/mL — ABNORMAL LOW (ref 30.00–100.00)

## 2015-08-19 NOTE — Telephone Encounter (Signed)
-----   Message from Ellamae Sia sent at 08/14/2015 10:20 AM EST ----- Regarding: Lab orders for Tuesday, 3.14.17 Lab orders for a 6 month follow up appt

## 2015-08-20 LAB — HEPATITIS C ANTIBODY: HCV Ab: NEGATIVE

## 2015-08-22 ENCOUNTER — Encounter: Payer: Self-pay | Admitting: Family Medicine

## 2015-08-22 ENCOUNTER — Ambulatory Visit (INDEPENDENT_AMBULATORY_CARE_PROVIDER_SITE_OTHER): Payer: 59 | Admitting: Family Medicine

## 2015-08-22 VITALS — BP 108/70 | HR 76 | Temp 97.7°F | Ht 60.5 in | Wt 148.0 lb

## 2015-08-22 DIAGNOSIS — E559 Vitamin D deficiency, unspecified: Secondary | ICD-10-CM

## 2015-08-22 DIAGNOSIS — E119 Type 2 diabetes mellitus without complications: Secondary | ICD-10-CM

## 2015-08-22 DIAGNOSIS — E785 Hyperlipidemia, unspecified: Secondary | ICD-10-CM

## 2015-08-22 LAB — HM DIABETES FOOT EXAM

## 2015-08-22 LAB — HM DIABETES EYE EXAM

## 2015-08-22 NOTE — Assessment & Plan Note (Signed)
Improved control but not at goal on current meds LDL goal < 100.

## 2015-08-22 NOTE — Patient Instructions (Addendum)
Start daily to twice daily vit D OTC 600 mg. Keep working on eating changes as discussed.   Work on exercise as able.

## 2015-08-22 NOTE — Progress Notes (Signed)
Pre visit review using our clinic review tool, if applicable. No additional management support is needed unless otherwise documented below in the visit note. 

## 2015-08-22 NOTE — Assessment & Plan Note (Signed)
INadequate control.. Start daily vit D OTC.

## 2015-08-22 NOTE — Progress Notes (Signed)
65 year old female presents For 6 months follow up.  She is seeing Dr. Melrose Nakayama for worsened migraine with aura. Felt likely rebound component. MRI was nml. She is planning on seeing Dr. Tomi Likens for further eval and treatment.  She is now seeing nutritionist.  Recommended MRT, mediator release test to eval.  Essentially allergy/sensitivities testing. The testing has shown extensive allergy.  Wt Readings from Last 3 Encounters:  08/22/15 148 lb (67.132 kg)  02/21/15 143 lb 4 oz (64.978 kg)  12/05/14 144 lb 4 oz (65.431 kg)     Brother diagnosed with ascending aortic aneurysm. Hx of smoking She has seen  Dr. Rockey Situ for consideration.  Elevated Cholesterol: LDL not at goal but improved in last 6 months on atorvastatin 80 mg daily and welchol, no SE.  Lab Results  Component Value Date   CHOL 215* 08/19/2015   HDL 57.60 08/19/2015   LDLCALC 135* 08/19/2015   LDLDIRECT 160.8 01/29/2013   TRIG 110.0 08/19/2015   CHOLHDL 4 08/19/2015  Using medications without problems: Muscle aches:  Diet compliance: Exercise: in PT for left knee issues. Other complaints:  Diabetes:  Slight worsening again but still at goal < 7 on no med. Pt has difficulty changing her diet given her migraines and limits to foods that Do not cause trigger.. Most foods she can eat are carbs.  With MRT testing she has realized that wheat triggers and she has been eating mainly this. Lab Results  Component Value Date   HGBA1C 6.8* 08/19/2015  Using medications without difficulties: Hypoglycemic episodes:? Hyperglycemic episodes:? Feet problems:none Blood Sugars averaging:not checking eye exam within last year: yes  BP Readings from Last 3 Encounters:  08/22/15 108/70  02/21/15 106/70  12/05/14 122/78   Vit D remains low.  Social History /Family History/Past Medical History reviewed and updated if needed.   Review of Systems  Constitutional: Negative for fever and fatigue.  HENT: Negative for ear  pain.  Eyes: Negative for pain.  Respiratory: Negative for chest tightness and shortness of breath.  Cardiovascular: Negative for chest pain, palpitations and leg swelling.  Gastrointestinal: Negative for abdominal pain.  Genitourinary: Negative for dysuria.       Objective:   Physical Exam  Constitutional: Vital signs are normal. She appears well-developed and well-nourished. She is cooperative. Non-toxic appearance. She does not appear ill. No distress.  HENT:  Head: Normocephalic.  Right Ear: Hearing, tympanic membrane, external ear and ear canal normal. Tympanic membrane is not erythematous, not retracted and not bulging.  Left Ear: Hearing, tympanic membrane, external ear and ear canal normal. Tympanic membrane is not erythematous, not retracted and not bulging.  Nose: No mucosal edema or rhinorrhea. Right sinus exhibits no maxillary sinus tenderness and no frontal sinus tenderness. Left sinus exhibits no maxillary sinus tenderness and no frontal sinus tenderness.  Mouth/Throat: Uvula is midline, oropharynx is clear and moist and mucous membranes are normal.  Eyes: Conjunctivae, EOM and lids are normal. Pupils are equal, round, and reactive to light. Lids are everted and swept, no foreign bodies found.  Neck: Trachea normal and normal range of motion. Neck supple. Carotid bruit is not present. No thyroid mass and no thyromegaly present.  Cardiovascular: Normal rate, regular rhythm, S1 normal, S2 normal, normal heart sounds, intact distal pulses and normal pulses. Exam reveals no gallop and no friction rub.  No murmur heard. Pulmonary/Chest: Effort normal and breath sounds normal. No tachypnea. No respiratory distress. She has no decreased breath sounds. She has no wheezes.  She has no rhonchi. She has no rales.  Abdominal: Soft. Normal appearance and bowel sounds are normal. There is no tenderness.  Neurological: She is alert.  Skin: Skin is warm, dry and intact. No rash  noted.  Psychiatric: Her speech is normal and behavior is normal. Judgment and thought content normal. Her mood appears not anxious. Cognition and memory are normal. She does not exhibit a depressed mood.           Diabetic foot exam: Normal inspection No skin breakdown No calluses  Normal DP pulses Normal sensation to light touch and monofilament Nails normal

## 2015-08-22 NOTE — Assessment & Plan Note (Signed)
Pt is making drastic diet changes to improve migraines. Trigs have already decreased. Will add back exercise as able. Re-eval in 6 months.

## 2015-08-27 ENCOUNTER — Encounter: Payer: Self-pay | Admitting: Neurology

## 2015-08-27 ENCOUNTER — Ambulatory Visit (INDEPENDENT_AMBULATORY_CARE_PROVIDER_SITE_OTHER): Payer: 59 | Admitting: Neurology

## 2015-08-27 VITALS — BP 116/64 | HR 86 | Ht 60.5 in | Wt 145.0 lb

## 2015-08-27 DIAGNOSIS — G43709 Chronic migraine without aura, not intractable, without status migrainosus: Secondary | ICD-10-CM

## 2015-08-27 DIAGNOSIS — G43109 Migraine with aura, not intractable, without status migrainosus: Secondary | ICD-10-CM

## 2015-08-27 MED ORDER — SUMATRIPTAN SUCCINATE 100 MG PO TABS: 100.0000 mg | ORAL_TABLET | Freq: Once | ORAL | Status: DC | PRN

## 2015-08-27 MED ORDER — SUMATRIPTAN SUCCINATE 3 MG/0.5ML ~~LOC~~ SOAJ: 6.0000 mg | SUBCUTANEOUS | Status: DC

## 2015-08-27 MED ORDER — PROPRANOLOL HCL ER 80 MG PO CP24: 80.0000 mg | ORAL_CAPSULE | Freq: Every day | ORAL | Status: DC

## 2015-08-27 MED ORDER — GABAPENTIN 300 MG PO CAPS: 300.0000 mg | ORAL_CAPSULE | Freq: Three times a day (TID) | ORAL | Status: DC | PRN

## 2015-08-27 NOTE — Progress Notes (Addendum)
NEUROLOGY CONSULTATION NOTE  JULEA ABDON MRN: [id number] DOB: 1950-11-10  Referring provider: Dr. Diona Browner Primary care provider: Dr. Diona Browner  Reason for consult:  migraine  HISTORY OF PRESENT ILLNESS: Debra Leach is a 65 year old right-handed female with hyperlipidemia, diabetes, GERD, osteoarthritis, and history of MVP and nephrolithiasis who presents for migraine.  History obtained by patient, PCP note and prior neurologist's notes.  Onset:  65 years old Location:  Varies (unilateral either side, frontal-temporal, back of head) Quality:  Varies (stabbing, pounding, throbbing) Intensity:  10/10 severe, otherwise 5-7/10 Aura:  Occurs off and on over the years and varies in semiology.  In early 1970s, black out.  In late 1980s, white out.  In late 1990s, flashing lights and zigzag lines.  Since 2016, scintillating scotoma (occurs 1 to 2 times a month) Prodrome:  no Associated symptoms:  Initially vomiting.  Sometimes nausea.  Photophobia, phonophobia.  Dizziness. Duration:  All day Frequency:  daily Triggers/exacerbating factors:  Change in weather, stress, valsalva, odors, certain foods  Relieving factors:  Ice, rest Activity:  Difficult to function if severe  Past NSAIDS:  diclofenac 50mg , Cambia, Mobic 15mg , ibuprofen, naproxen, toradol  Past analgesics:  tramadol (reaction), Midrin, Excedrin Past abortive triptans/ergots:  Treximet, Axert, Amerge, Frova, Maxalt, Relpax, Zomig tablet, DHE NS Past anxiolytic:  Buspirone, clonazepam Past antihypertensive medications:  Metoprolol, Norvasc Past antidepressant medications:  Venlafaxine, sertraline, amitriptyline, amoxapine, duloxetine, imipramine, Luvox, Remeron, Serzone, bupropion, desipramine, doxepin, Vivactil Past anticonvulsant medications:  Depakote, Topamax, zonisamide, Lamictal, Keppra, Lyrica Past vitamins/Herbal/Supplements:  butterbur Other past treatments:  Botox (2 years), Cefaly Device, methylergonovine,  acupuncture, biofeedback, Seroquel, Thorazine, methylsergide maleate  Current NSAIDS:  none Current analgesics:  none Current triptans:  sumatriptan 100mg  Current anti-emetic:  Zofran 4mg . Meclizine for dizziness. Current muscle relaxants:  none Current Antihypertensive medications:  Inderal LA 60mg  Current Antidepressant medications:  nortriptyline 10mg  (20mg  caused drowsiness) Current Anticonvulsant medications:  gabapentin 300mg  at bedtime and as needed Current Vitamins/Herbal/Supplements:  magnesium 400mg .   Will still use Cefaly device as abortive therapy  Caffeine:  1 cup coffee daily (no caffeine made no difference) Smoker:  no Diet:  Follows LEAP ImmunoCalm Dietary Program Exercise:  When tolerated (due to knee problem).  She walks.  She also bikes. Depression/stress:  stable Sleep hygiene:  poor Family history of headache:  Maybe her mother's side.  Prior brain MRI normal (date unknown but report is mentioned in prior notes).  PAST MEDICAL HISTORY: Past Medical History  Diagnosis Date  . Arthritis     osteoarthritis B knees, left hip , right elbow  . Hyperlipidemia   . GERD (gastroesophageal reflux disease)   . MVP (mitral valve prolapse)     history of    PAST SURGICAL HISTORY: Past Surgical History  Procedure Laterality Date  . Tubal ligation  1980  . Breast surgery  2000    breast biopsy (benign)  . Partial hysterectomy      Both ovaries remain, vaginal, for mennorhagia  . Tonsillectomy    . Lithotripsy  08-12-2008    stent placed bilaterally  . Arm surgery      MEDICATIONS: Current Outpatient Prescriptions on File Prior to Visit  Medication Sig Dispense Refill  . atorvastatin (LIPITOR) 80 MG tablet TAKE 1 TABLET BY MOUTH DAILY 90 tablet 2  . clotrimazole-betamethasone (LOTRISONE) cream Apply 1 application topically as needed. Please given in 2 x 15 gm tubes 30 g 1  . indapamide (LOZOL) 1.25 MG tablet TAKE ONE TABLET BY  MOUTH EVERY MORNING 90 tablet 3    . Magnesium 400 MG TABS Take 2 tablets by mouth daily.     . meclizine (ANTIVERT) 25 MG tablet Take 25-50 mg by mouth as needed for dizziness.    . ondansetron (ZOFRAN) 4 MG tablet Take 4 mg by mouth as needed for nausea.    . SUMAtriptan (IMITREX) 100 MG tablet Take 50 mg by mouth as needed.     . WELCHOL 625 MG tablet TAKE 3 TABLETS BY MOUTH TWO TIMES A DAY WITH MEALS 180 tablet 11   No current facility-administered medications on file prior to visit.    ALLERGIES: Allergies  Allergen Reactions  . Codeine     REACTION: Migraine  . Penicillins     REACTION: Itching  . Pentazocine Lactate     REACTION: Swelling, itching, rash  . Crestor [Rosuvastatin] Other (See Comments)    FAMILY HISTORY: Family History  Problem Relation Age of Onset  . Cancer Mother     bone  . Hypertension Father   . Mitral valve prolapse Father   . Asthma Brother   . Arthritis Brother   . Nephrolithiasis Brother   . Nephrolithiasis Brother   . Asthma Brother   . Arthritis Brother   . Aortic aneurysm Brother     ascending aortic aneuysm    SOCIAL HISTORY: Social History   Social History  . Marital Status: Married    Spouse Name: N/A  . Number of Children: 0  . Years of Education: N/A   Occupational History  . retired Public librarian    Social History Main Topics  . Smoking status: Former Research scientist (life sciences)  . Smokeless tobacco: Never Used  . Alcohol Use: Yes     Comment: rare  . Drug Use: No  . Sexual Activity: Not on file   Other Topics Concern  . Not on file   Social History Narrative   Regular exercise--yes, recumbent bike 3-4 days a week      Diet: fruits and veggies, water, eats at home, drinks a lot of milk    REVIEW OF SYSTEMS: Constitutional: No fevers, chills, or sweats, no generalized fatigue, change in appetite Eyes: No visual changes, double vision, eye pain Ear, nose and throat: No hearing loss, ear pain, nasal congestion, sore throat Cardiovascular: No chest pain,  palpitations Respiratory:  No shortness of breath at rest or with exertion, wheezes GastrointestinaI: No nausea, vomiting, diarrhea, abdominal pain, fecal incontinence Genitourinary:  No dysuria, urinary retention or frequency Musculoskeletal:  No neck pain, back pain Integumentary: No rash, pruritus, skin lesions Neurological: as above Psychiatric: No depression, insomnia, anxiety Endocrine: No palpitations, fatigue, diaphoresis, mood swings, change in appetite, change in weight, increased thirst Hematologic/Lymphatic:  No anemia, purpura, petechiae. Allergic/Immunologic: no itchy/runny eyes, nasal congestion, recent allergic reactions, rashes  PHYSICAL EXAM: Filed Vitals:   08/27/15 0752  BP: 116/64  Pulse: 86   General: No acute distress.  Patient appears well-groomed.  Head:  Normocephalic/atraumatic Eyes:  fundi unremarkable, without vessel changes, exudates, hemorrhages or papilledema. Neck: supple, no paraspinal tenderness, full range of motion Back: No paraspinal tenderness Heart: regular rate and rhythm Lungs: Clear to auscultation bilaterally. Vascular: No carotid bruits. Neurological Exam: Mental status: alert and oriented to person, place, and time, recent and remote memory intact, fund of knowledge intact, attention and concentration intact, speech fluent and not dysarthric, language intact. Cranial nerves: CN I: not tested CN II: pupils equal, round and reactive to light, visual fields intact, fundi  unremarkable, without vessel changes, exudates, hemorrhages or papilledema. CN III, IV, VI:  full range of motion, no nystagmus, no ptosis CN V: facial sensation intact CN VII: upper and lower face symmetric CN VIII: hearing intact CN IX, X: gag intact, uvula midline CN XI: sternocleidomastoid and trapezius muscles intact CN XII: tongue midline Bulk & Tone: normal, no fasciculations. Motor:  5/5 throughout  Sensation: temperature and vibration sensation intact. Deep  Tendon Reflexes:  2+ throughout, toes downgoing.  Finger to nose testing:  Without dysmetria.  Heel to shin:  Without dysmetria.  Gait:  Normal station and stride.  Able to turn and tandem walk. Romberg negative.  IMPRESSION: Chronic migraine without aura Migraine with aura  PLAN: 1.  Increase Inderal LA to 80mg  daily 2.  Continue gabapentin 300mg  at bedtime and as needed 3.  For abortive therapy, stop sumatriptan tablet and instead try Zembrace SymTouch 6mg  injection 4.  Stop nortriptyline 5.  Sleep hygiene, exercise, hydration 6.  Call in 4 weeks with update.  Follow up in approximately 3 months.  Thank you for allowing me to take part in the care of this patient.  Metta Clines, DO  CC:  Eliezer Lofts, MD

## 2015-08-27 NOTE — Patient Instructions (Signed)
Migraine Recommendations: 1.  Increase Inderal LA to 80mg  daily.  Call in 4 weeks with update and we can adjust dose if needed. 2.  Take Zembrace 6mg  injection at earliest onset of headache.  May repeat dose once in 1 hour if needed.  Do not exceed two injections in 24 hours. 3.  Limit use of pain relievers to no more than 2 days out of the week.  These medications include acetaminophen, ibuprofen, triptans and narcotics.  This will help reduce risk of rebound headaches. 4.  Be aware of common food triggers such as processed sweets, processed foods with nitrites (such as deli meat, hot dogs, sausages), foods with MSG, alcohol (such as wine), chocolate, certain cheeses, certain fruits (dried fruits, some citrus fruit), vinegar, diet soda. 4.  Avoid caffeine 5.  Routine exercise 6.  Proper sleep hygiene 7.  Stay adequately hydrated with water 8.  Keep a headache diary. 9.  Maintain proper stress management. 10.  Do not skip meals. 11.  Consider supplements:  Magnesium oxide 400mg  to 600mg  daily, riboflavin 400mg , Coenzyme Q 10 100mg  three times daily 12.  Continue gabapentin 13.  Stop nortriptyline and sumatriptan 100mg  tablets 14.  Follow up in 3 to 4 months but CALL IN 4 WEEKS WITH UPDATE.

## 2015-09-24 ENCOUNTER — Encounter: Payer: Self-pay | Admitting: Neurology

## 2015-09-24 MED ORDER — PROPRANOLOL HCL ER 120 MG PO CP24: 120.0000 mg | ORAL_CAPSULE | Freq: Every day | ORAL | Status: DC

## 2015-09-24 NOTE — Telephone Encounter (Signed)
Please see pt's message.

## 2015-09-30 MED ORDER — SUMATRIPTAN SUCCINATE 6 MG/0.5ML ~~LOC~~ SOAJ: SUBCUTANEOUS | Status: DC

## 2015-09-30 NOTE — Addendum Note (Signed)
Addended by: Gerda Diss A on: 09/30/2015 11:13 AM   Modules accepted: Orders

## 2015-11-11 ENCOUNTER — Other Ambulatory Visit: Payer: Self-pay | Admitting: Family Medicine

## 2015-11-11 DIAGNOSIS — Z1231 Encounter for screening mammogram for malignant neoplasm of breast: Secondary | ICD-10-CM

## 2015-11-24 ENCOUNTER — Other Ambulatory Visit: Payer: Self-pay | Admitting: Neurology

## 2015-11-24 NOTE — Telephone Encounter (Signed)
Last OV: 08/28/15 Next OV: 12/29/15  PLAN: 1. Increase Inderal LA to 80mg  daily  Mychart:   The propranolol ER dose is still low. I would like to increase dose one more time, up to 120mg  daily, and see how she does for the next month.

## 2015-12-29 ENCOUNTER — Encounter: Payer: Self-pay | Admitting: Neurology

## 2015-12-29 ENCOUNTER — Ambulatory Visit (INDEPENDENT_AMBULATORY_CARE_PROVIDER_SITE_OTHER): Payer: 59 | Admitting: Neurology

## 2015-12-29 VITALS — BP 106/64 | HR 62 | Ht 60.5 in | Wt 152.0 lb

## 2015-12-29 DIAGNOSIS — G43709 Chronic migraine without aura, not intractable, without status migrainosus: Secondary | ICD-10-CM | POA: Diagnosis not present

## 2015-12-29 MED ORDER — GABAPENTIN 300 MG PO CAPS: 600.0000 mg | ORAL_CAPSULE | Freq: Two times a day (BID) | ORAL | 5 refills | Status: DC

## 2015-12-29 MED ORDER — SUMATRIPTAN SUCCINATE 6 MG/0.5ML ~~LOC~~ SOAJ: SUBCUTANEOUS | 5 refills | Status: DC

## 2015-12-29 MED ORDER — ONDANSETRON HCL 4 MG PO TABS: 4.0000 mg | ORAL_TABLET | ORAL | 3 refills | Status: DC | PRN

## 2015-12-29 NOTE — Progress Notes (Signed)
NEUROLOGY FOLLOW UP OFFICE NOTE  Debra Leach [id number]  HISTORY OF PRESENT ILLNESS: Debra Leach is a 65 year old right-handed female with hyperlipidemia, diabetes, GERD, osteoarthritis, and history of MVP and nephrolithiasis who follows up for chronic migraine.  UPDATE: No change in headaches. Constant 4-6/10 headache.  Severe migraines: Intensity:  8-10/10 Duration:  Around 2 hours with sumatriptan 6mg  [place] Frequency:  4 days per week Current NSAIDS:  none Current analgesics:  none Current triptans:  sumatriptan 100mg , sumatriptan 6mg  [place] Current anti-emetic:  Zofran 4mg . Meclizine for dizziness. Current muscle relaxants:  none Current Antihypertensive medications:  Inderal LA 120mg  Current Antidepressant medications:  none Current Anticonvulsant medications:  gabapentin 300mg  at bedtime and as needed Current Vitamins/Herbal/Supplements:  magnesium 400mg , riboflavin Will still use Cefaly device as abortive therapy  HISTORY: Onset:  65 years old Location:  Varies (unilateral either side, frontal-temporal, back of head) Quality:  Varies (stabbing, pounding, throbbing) Initial Intensity:  10/10 severe, otherwise 5-7/10 Aura:  Occurs off and on over the years and varies in semiology.  In early 1970s, black out.  In late 1980s, white out.  In late 1990s, flashing lights and zigzag lines.  Since 2016, scintillating scotoma (occurs 1 to 2 times a month) Prodrome:  no Associated symptoms:  Initially vomiting.  Sometimes nausea.  Photophobia, phonophobia.  Dizziness. Initial Duration:  All day Initial Frequency:  daily Triggers/exacerbating factors:  Change in weather, stress, valsalva, odors, certain foods  Relieving factors:  Ice, rest Activity:  Difficult to function if severe  Past NSAIDS:  diclofenac 50mg , Cambia, Mobic 15mg , ibuprofen, naproxen, toradol  Past analgesics:  tramadol (reaction), Midrin, Excedrin Past abortive triptans/ergots:  Treximet, Axert, Amerge,  Frova, Maxalt, Relpax, Zomig tablet, DHE NS Past anxiolytic:  Buspirone, clonazepam Past antihypertensive medications:  Metoprolol, Norvasc Past antidepressant medications:  Venlafaxine, sertraline, amitriptyline, amoxapine, duloxetine, nortriptyline, imipramine, Luvox, Remeron, Serzone, bupropion, desipramine, doxepin, Vivactil Past anticonvulsant medications:  Depakote, Topamax, zonisamide, Lamictal, Keppra, Lyrica Past vitamins/Herbal/Supplements:  butterbur Other past treatments:  Botox (2 years), Cefaly Device, methylergonovine, acupuncture, biofeedback, Seroquel, Thorazine, methylsergide maleate   Caffeine:  1 cup coffee daily (no caffeine made no difference) Smoker:  no Diet:  Follows LEAP ImmunoCalm Dietary Program Exercise:  When tolerated (due to knee problem).  She walks.  She also bikes. Depression/stress:  stable Sleep hygiene:  poor Family history of headache:  Maybe her mother's side.  Prior brain MRI normal (date unknown but report is mentioned in prior notes).  PAST MEDICAL HISTORY: Past Medical History:  Diagnosis Date  . Arthritis    osteoarthritis B knees, left hip , right elbow  . GERD (gastroesophageal reflux disease)   . Hyperlipidemia   . MVP (mitral valve prolapse)    history of    MEDICATIONS: Current Outpatient Prescriptions on File Prior to Visit  Medication Sig Dispense Refill  . atorvastatin (LIPITOR) 80 MG tablet TAKE 1 TABLET BY MOUTH DAILY 90 tablet 2  . clotrimazole-betamethasone (LOTRISONE) cream Apply 1 application topically as needed. Please given in 2 x 15 gm tubes 30 g 1  . indapamide (LOZOL) 1.25 MG tablet TAKE ONE TABLET BY MOUTH EVERY MORNING 90 tablet 3  . Magnesium 400 MG TABS Take 2 tablets by mouth daily.     . meclizine (ANTIVERT) 25 MG tablet Take 25-50 mg by mouth as needed for dizziness.    . propranolol ER (INDERAL LA) 120 MG 24 hr capsule TAKE ONE (1) CAPSULE BY MOUTH EACH DAY 30 capsule 1  .  WELCHOL 625 MG tablet TAKE 3  TABLETS BY MOUTH TWO TIMES A DAY WITH MEALS 180 tablet 11   No current facility-administered medications on file prior to visit.     ALLERGIES: Allergies  Allergen Reactions  . Codeine     REACTION: Migraine  . Penicillins     REACTION: Itching  . Pentazocine Lactate     REACTION: Swelling, itching, rash  . Crestor [Rosuvastatin] Other (See Comments)    FAMILY HISTORY: Family History  Problem Relation Age of Onset  . Cancer Mother     bone  . Hypertension Father   . Mitral valve prolapse Father   . Asthma Brother   . Arthritis Brother   . Nephrolithiasis Brother   . Nephrolithiasis Brother   . Asthma Brother   . Arthritis Brother   . Aortic aneurysm Brother     ascending aortic aneuysm    SOCIAL HISTORY: Social History   Social History  . Marital status: Married    Spouse name: N/A  . Number of children: 0  . Years of education: N/A   Occupational History  . retired Public librarian Retired   Social History Main Topics  . Smoking status: Former Research scientist (life sciences)  . Smokeless tobacco: Never Used  . Alcohol use Yes     Comment: rare  . Drug use: No  . Sexual activity: Not on file   Other Topics Concern  . Not on file   Social History Narrative   Regular exercise--yes, recumbent bike 3-4 days a week      Diet: fruits and veggies, water, eats at home, drinks a lot of milk    REVIEW OF SYSTEMS: Constitutional: No fevers, chills, or sweats, no generalized fatigue, change in appetite Eyes: No visual changes, double vision, eye pain Ear, nose and throat: No hearing loss, ear pain, nasal congestion, sore throat Cardiovascular: No chest pain, palpitations Respiratory:  No shortness of breath at rest or with exertion, wheezes GastrointestinaI: No nausea, vomiting, diarrhea, abdominal pain, fecal incontinence Genitourinary:  No dysuria, urinary retention or frequency Musculoskeletal:  No neck pain, back pain Integumentary: No rash, pruritus, skin lesions Neurological:  as above Psychiatric: No depression, insomnia, anxiety Endocrine: No palpitations, fatigue, diaphoresis, mood swings, change in appetite, change in weight, increased thirst Hematologic/Lymphatic:  No purpura, petechiae. Allergic/Immunologic: no itchy/runny eyes, nasal congestion, recent allergic reactions, rashes  PHYSICAL EXAM: Vitals:   12/29/15 0917  BP: 106/64  Pulse: 62   General: No acute distress.  Patient appears well-groomed.  normal body habitus. Head:  Normocephalic/atraumatic.  IMPRESSION: Chronic migraine  PLAN: 1.  Increase gabapentin to 600mg  twice daily 2.  Discontinue propranolol 3.  Sumatriptan 6mg  [place] and Zofran as needed. 4.  Discussed daith piercing as possible option. 5.  Follow up in 4 months.  26 minutes spent face to face with patient, 100% spent counseling.  Metta Clines, DO  CC:  Eliezer Lofts, MD

## 2015-12-29 NOTE — Patient Instructions (Addendum)
1.  Increase gabapentin to 600mg  twice daily.  May go up on dose by 300mg  every week if needed. 2.  Stop propranolol 3.  Use sumatriptan 6mg  injection and Zofran as needed  4.  Limit pain relievers to no more than 2 days out of week 5.  Consider Daith piercing 6.  Follow up in 4 months

## 2016-01-07 ENCOUNTER — Other Ambulatory Visit: Payer: Self-pay | Admitting: Family Medicine

## 2016-01-07 ENCOUNTER — Encounter: Payer: Self-pay | Admitting: Family Medicine

## 2016-01-07 MED ORDER — NEOMYCIN-POLYMYXIN-HC 1 % OT SOLN: 3.0000 [drp] | Freq: Four times a day (QID) | OTIC | 0 refills | Status: DC

## 2016-01-27 ENCOUNTER — Ambulatory Visit
Admission: RE | Admit: 2016-01-27 | Discharge: 2016-01-27 | Disposition: A | Discharge status: Home / Self Care | Payer: 59 | Source: Ambulatory Visit | Attending: Family Medicine | Admitting: Family Medicine

## 2016-01-27 ENCOUNTER — Other Ambulatory Visit: Payer: Self-pay | Admitting: Family Medicine

## 2016-01-27 DIAGNOSIS — Z1231 Encounter for screening mammogram for malignant neoplasm of breast: Secondary | ICD-10-CM

## 2016-01-30 ENCOUNTER — Encounter: Payer: Self-pay | Admitting: Neurology

## 2016-02-20 ENCOUNTER — Other Ambulatory Visit (INDEPENDENT_AMBULATORY_CARE_PROVIDER_SITE_OTHER): Payer: 59

## 2016-02-20 ENCOUNTER — Telehealth: Payer: Self-pay | Admitting: Family Medicine

## 2016-02-20 DIAGNOSIS — E119 Type 2 diabetes mellitus without complications: Secondary | ICD-10-CM

## 2016-02-20 DIAGNOSIS — E785 Hyperlipidemia, unspecified: Secondary | ICD-10-CM | POA: Diagnosis not present

## 2016-02-20 DIAGNOSIS — E559 Vitamin D deficiency, unspecified: Secondary | ICD-10-CM

## 2016-02-20 LAB — LIPID PANEL
Cholesterol: 210 mg/dL — ABNORMAL HIGH (ref 0–200)
HDL: 62.6 mg/dL (ref >39.00)
LDL Cholesterol: 124 mg/dL — ABNORMAL HIGH (ref 0–99)
NonHDL: 147.69
Total CHOL/HDL Ratio: 3
Triglycerides: 116 mg/dL (ref 0.0–149.0)
VLDL: 23.2 mg/dL (ref 0.0–40.0)

## 2016-02-20 LAB — HEMOGLOBIN A1C: Hgb A1c MFr Bld: 6.5 % (ref 4.6–6.5)

## 2016-02-20 LAB — COMPREHENSIVE METABOLIC PANEL
ALT: 23 U/L (ref 0–35)
AST: 21 U/L (ref 0–37)
Albumin: 3.8 g/dL (ref 3.5–5.2)
Alkaline Phosphatase: 92 U/L (ref 39–117)
BUN: 16 mg/dL (ref 6–23)
CO2: 31 mEq/L (ref 19–32)
Calcium: 9.2 mg/dL (ref 8.4–10.5)
Chloride: 104 mEq/L (ref 96–112)
Creatinine, Ser: 0.93 mg/dL (ref 0.40–1.20)
GFR: 64.35 mL/min (ref >60.00)
Glucose, Bld: 108 mg/dL — ABNORMAL HIGH (ref 70–99)
Potassium: 4.9 mEq/L (ref 3.5–5.1)
Sodium: 142 mEq/L (ref 135–145)
Total Bilirubin: 0.4 mg/dL (ref 0.2–1.2)
Total Protein: 6.5 g/dL (ref 6.0–8.3)

## 2016-02-20 LAB — VITAMIN D 25 HYDROXY (VIT D DEFICIENCY, FRACTURES): VITD: 28.02 ng/mL — ABNORMAL LOW (ref 30.00–100.00)

## 2016-02-20 NOTE — Telephone Encounter (Signed)
-----   Message from Marchia Bond sent at 02/17/2016  9:35 AM EDT ----- Regarding: Cpx labs Fri 9/15, need orders. Thanks! :-) Please order  future cpx labs for pt's upcoming lab appt. Thanks Aniceto Boss

## 2016-02-24 ENCOUNTER — Ambulatory Visit (INDEPENDENT_AMBULATORY_CARE_PROVIDER_SITE_OTHER): Payer: 59 | Admitting: Family Medicine

## 2016-02-24 ENCOUNTER — Encounter: Payer: Self-pay | Admitting: Family Medicine

## 2016-02-24 VITALS — BP 116/80 | HR 90 | Temp 98.7°F | Ht 60.5 in | Wt 149.5 lb

## 2016-02-24 DIAGNOSIS — Z23 Encounter for immunization: Secondary | ICD-10-CM

## 2016-02-24 DIAGNOSIS — Z0000 Encounter for general adult medical examination without abnormal findings: Secondary | ICD-10-CM | POA: Diagnosis not present

## 2016-02-24 DIAGNOSIS — E785 Hyperlipidemia, unspecified: Secondary | ICD-10-CM

## 2016-02-24 DIAGNOSIS — R42 Dizziness and giddiness: Secondary | ICD-10-CM

## 2016-02-24 DIAGNOSIS — E559 Vitamin D deficiency, unspecified: Secondary | ICD-10-CM

## 2016-02-24 DIAGNOSIS — E119 Type 2 diabetes mellitus without complications: Secondary | ICD-10-CM

## 2016-02-24 LAB — HM DIABETES FOOT EXAM

## 2016-02-24 LAB — CBC WITH DIFFERENTIAL/PLATELET
Basophils Absolute: 0 10*3/uL (ref 0.0–0.1)
Basophils Relative: 0.6 % (ref 0.0–3.0)
Eosinophils Absolute: 0.1 10*3/uL (ref 0.0–0.7)
Eosinophils Relative: 1.7 % (ref 0.0–5.0)
HCT: 40.4 % (ref 36.0–46.0)
Hemoglobin: 13.5 g/dL (ref 12.0–15.0)
Lymphocytes Relative: 16 % (ref 12.0–46.0)
Lymphs Abs: 1.1 10*3/uL (ref 0.7–4.0)
MCHC: 33.4 g/dL (ref 30.0–36.0)
MCV: 84.9 fl (ref 78.0–100.0)
Monocytes Absolute: 0.5 10*3/uL (ref 0.1–1.0)
Monocytes Relative: 6.9 % (ref 3.0–12.0)
Neutro Abs: 5.2 10*3/uL (ref 1.4–7.7)
Neutrophils Relative %: 74.8 % (ref 43.0–77.0)
Platelets: 313 10*3/uL (ref 150.0–400.0)
RBC: 4.76 Mil/uL (ref 3.87–5.11)
RDW: 14.9 % (ref 11.5–15.5)
WBC: 6.9 10*3/uL (ref 4.0–10.5)

## 2016-02-24 LAB — TSH: TSH: 1.33 u[IU]/mL (ref 0.35–4.50)

## 2016-02-24 LAB — VITAMIN B12: Vitamin B-12: 321 pg/mL (ref 211–911)

## 2016-02-24 MED ORDER — COLESEVELAM HCL 625 MG PO TABS: ORAL_TABLET | ORAL | 11 refills | Status: DC

## 2016-02-24 MED ORDER — CLOTRIMAZOLE-BETAMETHASONE 1-0.05 % EX CREA: 1.0000 "application " | TOPICAL_CREAM | CUTANEOUS | 1 refills | Status: DC | PRN

## 2016-02-24 MED ORDER — ATORVASTATIN CALCIUM 80 MG PO TABS: 80.0000 mg | ORAL_TABLET | Freq: Every day | ORAL | 3 refills | Status: DC

## 2016-02-24 MED ORDER — INDAPAMIDE 1.25 MG PO TABS: 1.2500 mg | ORAL_TABLET | Freq: Every morning | ORAL | 3 refills | Status: DC

## 2016-02-24 NOTE — Patient Instructions (Addendum)
Vit D supplement continue.  Check blood sugar when feeling dizzy.  Stop at lab on way out.  Return for OV if symptoms not improving.

## 2016-02-24 NOTE — Progress Notes (Signed)
Pre visit review using our clinic review tool, if applicable. No additional management support is needed unless otherwise documented below in the visit note. 

## 2016-02-24 NOTE — Assessment & Plan Note (Addendum)
Eval with labs.  Given rx for glucometer to check CBGs during symtptoms.  posibly associated with migraine.   NO clear med SE.

## 2016-02-24 NOTE — Assessment & Plan Note (Signed)
Now at goal on no med with lifestyle changes and low carb diet.

## 2016-02-24 NOTE — Assessment & Plan Note (Signed)
Not on daily supplement . Continue as improving.

## 2016-02-24 NOTE — Progress Notes (Signed)
The patient is here for annual wellness exam and preventative care.   She is doing well overall.   She is being treated for migraines by neurologist.  CoQ 10 and Vit B2 has helped intensity of headaches.  She has noted lightheadedness and nausea daily.. Neuro feels it may be due to migraine. She is not sure given she never had in past. Describes at lightheadedness. Not associated with sitting to standing, can be sitting at rest.  She has noted in last month recurrence (18 months ago with  externalear infection).. We called in  cipro drops.. Improved symptoms, but still pain in both ears at times.  She has noted odor when she wipes ears out. Trys to keep ear canals dry.  Saw Dr. Alinda Money in past for kidney stones.  On indapamide.  We will follow here and prescribe medication.  Needs yearly BMET.   Diabetes: Good control on lower carb diet.  Lab Results  Component Value Date   HGBA1C 6.5 02/20/2016   Hypoglycemic episodes:not checking Hyperglycemic episodes: Feet problems:none Blood Sugars averaging: not checking. eye exam within last year:  yes  Working on low carb.  Walking 1-5 miles 3-5 times a week. Microalbumin in nml range.  Elevated Cholesterol: At goal  on atorvastain 40 mg daily. In past zetia ineffective, LDLnot at goal <100. On welchol as well. Lab Results  Component Value Date   CHOL 210 (H) 02/20/2016   HDL 62.60 02/20/2016   LDLCALC 124 (H) 02/20/2016   LDLDIRECT 160.8 01/29/2013   TRIG 116.0 02/20/2016   CHOLHDL 3 02/20/2016   Using medications without problems: no  Muscle aches: No  Other complaints:   BP well controlled.  BP Readings from Last 3 Encounters:  02/24/16 116/80  12/29/15 106/64  08/27/15 116/64   Exercise: Walking few days a week or stationary bike. Diet: Moderate. Limited with migraine triggers  Body mass index is 28.72 kg/m.  Wt Readings from Last 3 Encounters:  02/24/16 149 lb 8 oz (67.8 kg)  12/29/15 152 lb  (68.9 kg)  08/27/15 145 lb (65.8 kg)     Vit D supplement continue  Social History /Family History/Past Medical History reviewed and updated if needed.  Review of Systems  Constitutional: Positive for fatigue. Negative for fever and unexpected weight change.  Associated with migraines  HENT: Negative for ear pain, congestion, sore throat, sneezing, trouble swallowing and sinus pressure.  Eyes: Negative for pain and itching.  Respiratory: Negative for cough, shortness of breath and wheezing.  Cardiovascular: Negative for chest pain, palpitations and leg swelling.  Gastrointestinal: Negative for nausea, diarrhea, constipation and blood in stool.  Genitourinary: Negative for dysuria, hematuria, vaginal discharge, difficulty urinating and menstrual problem.  Skin: Negative for rash.  Neurological: Positive for headaches. Negative for syncope, weakness, light-headedness and numbness.  Migraines  Psychiatric/Behavioral: Negative for confusion and dysphoric mood. The patient is not nervous/anxious.  Objective:   Physical Exam  Constitutional: Vital signs are normal. She appears well-developed and well-nourished. She is cooperative. Non-toxic appearance. She does not appear ill. No distress.  HENT:  Head: Normocephalic.  Right Ear: Hearing, tympanic membrane, external ear and ear canal normal.  Left Ear: Hearing, tympanic membrane, external ear and ear canal normal.  Nose: Nose normal.  Eyes: Conjunctivae normal, EOM and lids are normal. Pupils are equal, round, and reactive to light. No foreign bodies found.  Neck: Trachea normal and normal range of motion. Neck supple. Carotid bruit is not present. No mass and no thyromegaly present.  Cardiovascular: Normal rate, regular rhythm, S1 normal, S2 normal, normal heart sounds and intact distal pulses. Exam reveals no gallop.  No murmur heard.  Pulmonary/Chest: Effort normal and breath sounds normal. No respiratory  distress. She has no wheezes. She has no rhonchi. She has no rales.  Abdominal: Soft. Normal appearance and bowel sounds are normal. She exhibits no distension, no fluid wave, no abdominal bruit and no mass. There is no hepatosplenomegaly. There is no tenderness. There is no rebound, no guarding and no CVA tenderness. No hernia.  Genitourinary: Vagina normal and uterus normal. No breast swelling, tenderness, discharge or bleeding. Pelvic exam was performed with patient supine. There is no tenderness or lesion on the right labia. There is no tenderness or lesion on the left labia. NO UTERUS!. Right adnexum displays no mass, no tenderness and no fullness. Left adnexum displays no mass, no tenderness and no fullness.  NO PAP performed. Lymphadenopathy:  She has no cervical adenopathy.  She has no axillary adenopathy.  Neurological: She is alert. She has normal strength. No cranial nerve deficit or sensory deficit.  Skin: Skin is warm, dry and intact. No rash noted.  Psychiatric: Her speech is normal and behavior is normal. Judgment normal. Her mood appears not anxious. Cognition and memory are normal. She does not exhibit a depressed mood.   Diabetic foot exam: Normal inspection No skin breakdown No calluses  Normal DP pulses Normal sensation to light touch and monofilament Nails normal  Assessment & Plan:   The patient's preventative maintenance and recommended screening tests for an annual wellness exam were reviewed in full today.  Brought up to date unless services declined.  Counselled on the importance of diet, exercise, and its role in overall health and mortality.  The patient's FH and SH was reviewed, including their home life, tobacco status, and drug and alcohol status.   Colonoscopy was normal in 2014, repeat in 10 years.  Nml endoscopy  For GI bleed in 12/2014. PAP partial hysterectomy 2001, cervix remains, last pap 2015, no further indicated. No family history of ovarian  cancer, DVE yearly. Flu given, Tdap due, Shingles PNA vaccine. Last DEXA Stable osteopenia, nml vit D. 02/2013 repeat in 5 years Mammo: nml 01/2016

## 2016-02-24 NOTE — Assessment & Plan Note (Signed)
Improving control on statin, not yet at goal. Reviewed healthy eating habits.

## 2016-02-24 NOTE — Addendum Note (Signed)
Addended by: Carter Kitten on: 02/24/2016 09:04 AM   Modules accepted: Orders

## 2016-05-04 ENCOUNTER — Ambulatory Visit: Payer: 59 | Admitting: Neurology

## 2016-05-05 ENCOUNTER — Encounter: Payer: Self-pay | Admitting: Neurology

## 2016-05-18 ENCOUNTER — Encounter: Payer: Self-pay | Admitting: Neurology

## 2016-05-18 ENCOUNTER — Telehealth: Payer: Self-pay

## 2016-05-18 ENCOUNTER — Ambulatory Visit (INDEPENDENT_AMBULATORY_CARE_PROVIDER_SITE_OTHER): Payer: Medicare Other | Admitting: Neurology

## 2016-05-18 ENCOUNTER — Ambulatory Visit (INDEPENDENT_AMBULATORY_CARE_PROVIDER_SITE_OTHER): Payer: Medicare Other | Admitting: *Deleted

## 2016-05-18 ENCOUNTER — Ambulatory Visit: Payer: 59

## 2016-05-18 VITALS — BP 126/68 | HR 84 | Ht 60.5 in | Wt 149.0 lb

## 2016-05-18 DIAGNOSIS — G43709 Chronic migraine without aura, not intractable, without status migrainosus: Secondary | ICD-10-CM

## 2016-05-18 DIAGNOSIS — Z23 Encounter for immunization: Secondary | ICD-10-CM

## 2016-05-18 NOTE — Progress Notes (Signed)
I have never prescribed that medication before either!

## 2016-05-18 NOTE — Progress Notes (Signed)
I am sorry, I have never prescribed this either! Debra Leach

## 2016-05-18 NOTE — Progress Notes (Addendum)
NEUROLOGY FOLLOW UP OFFICE NOTE  ANAUTICA MEAGER [id number]  HISTORY OF PRESENT ILLNESS: Debra Leach is a 65 year old right-handed female with hyperlipidemia, diabetes, GERD, osteoarthritis, and history of MVP and nephrolithiasis who follows up for chronic migraine.   UPDATE: No change in headaches. Constant 4-6/10 headache.  Severe migraines: Intensity:  8-10/10 Duration:  Around 2 hours with sumatriptan 6mg  [place] Frequency:  4 days per week Current NSAIDS:  none Current analgesics:  none Current triptans:  sumatriptan 100mg , sumatriptan 6mg  [place] Current anti-emetic:  Zofran 4mg . Meclizine for dizziness. Current muscle relaxants:  none Current Antihypertensive medications:  Indapamide Current Antidepressant medications:  none Current Anticonvulsant medications:  gabapentin 600mg  twice daily.  It helps but causes sedation, so she takes 300mg  in AM, 300mg  in late AM and 600mg  at night. Current Vitamins/Herbal/Supplements:  magnesium 400mg , riboflavin, coenzyme Q10 Will still use Cefaly device as abortive therapy but would like to get the new preventative one.  Caffeine:  1 cup coffee daily (no caffeine made no difference) Smoker:  no Diet:  Follows LEAP ImmunoCalm Dietary Program Exercise:  When tolerated (due to knee problem).  She walks.  She also bikes. Depression/stress:  stable Sleep hygiene:  poor   HISTORY: Onset:  65 years old Location:  Varies (unilateral either side, frontal-temporal, back of head) Quality:  Varies (stabbing, pounding, throbbing) Initial Intensity:  10/10 severe, otherwise 5-7/10 Aura:  Occurs off and on over the years and varies in semiology.  In early 1970s, black out.  In late 1980s, white out.  In late 1990s, flashing lights and zigzag lines.  Since 2016, scintillating scotoma (occurs 1 to 2 times a month) Prodrome:  no Associated symptoms:  Initially vomiting.  Sometimes nausea.  Photophobia, phonophobia.  Dizziness. Initial Duration:  All  day Initial Frequency:  daily Triggers/exacerbating factors:  Change in weather, stress, valsalva, odors, certain foods  Relieving factors:  Ice, rest Activity:  Difficult to function if severe   Past NSAIDS:  diclofenac 50mg , Cambia, Mobic 15mg , ibuprofen, naproxen, toradol  Past analgesics:  tramadol (reaction), Midrin, Excedrin Past abortive triptans/ergots:  Treximet, Axert, Amerge, Frova, Maxalt, Relpax, Zomig tablet, DHE NS Past anxiolytic:  Buspirone, clonazepam Past antihypertensive medications:  Metoprolol, Norvasc, propranolol ER 120mg  Past antidepressant medications:  Venlafaxine, sertraline, amitriptyline, amoxapine, duloxetine, nortriptyline, imipramine, Luvox, Remeron, Serzone, bupropion, desipramine, doxepin, Vivactil Past anticonvulsant medications:  Depakote, Topamax, zonisamide, Lamictal, Keppra, Lyrica Past vitamins/Herbal/Supplements:  butterbur Other past treatments:  Botox (2 years), Cefaly Device, methylergonovine, acupuncture, biofeedback, Seroquel, Thorazine, methylsergide maleate     Family history of headache:  Maybe her mother's side.   Prior brain MRI normal (date unknown but report is mentioned in prior notes).  PAST MEDICAL HISTORY: Past Medical History:  Diagnosis Date  . Arthritis    osteoarthritis B knees, left hip , right elbow  . GERD (gastroesophageal reflux disease)   . Hyperlipidemia   . MVP (mitral valve prolapse)    history of    MEDICATIONS: Current Outpatient Prescriptions on File Prior to Visit  Medication Sig Dispense Refill  . atorvastatin (LIPITOR) 80 MG tablet Take 1 tablet (80 mg total) by mouth daily. 90 tablet 3  . cholecalciferol (VITAMIN D) 1000 units tablet Take 1,000 Units by mouth daily.    . clotrimazole-betamethasone (LOTRISONE) cream Apply 1 application topically as needed. Please given in 2 x 15 gm tubes 30 g 1  . Coenzyme Q10 (CO Q 10) 100 MG CAPS Take 1 capsule by mouth 3 (three) times daily.    [place]  colesevelam  (WELCHOL) 625 MG tablet TAKE 3 TABLETS BY MOUTH TWO TIMES A DAY WITH MEALS 180 tablet 11  . gabapentin (NEURONTIN) 300 MG capsule Take 2 capsules (600 mg total) by mouth 2 (two) times daily. 120 capsule 5  . indapamide (LOZOL) 1.25 MG tablet Take 1 tablet (1.25 mg total) by mouth every morning. 90 tablet 3  . Magnesium 400 MG TABS Take 2 tablets by mouth daily.     . meclizine (ANTIVERT) 25 MG tablet Take 25-50 mg by mouth as needed for dizziness.    . ondansetron (ZOFRAN) 4 MG tablet Take 1 tablet (4 mg total) by mouth as needed for nausea. 20 tablet 3  . Riboflavin (VITAMIN B2 PO) Take 400 mg by mouth 3 (three) times daily.    . SUMAtriptan 6 MG/0.5ML SOAJ Inject into skin at first onset of migraine, may take 2nd dose in 2 hours if headache persists or returns 4.5 mL 5   No current facility-administered medications on file prior to visit.     ALLERGIES: Allergies  Allergen Reactions  . Codeine     REACTION: Migraine  . Penicillins     REACTION: Itching  . Pentazocine Lactate     REACTION: Swelling, itching, rash  . Crestor [Rosuvastatin] Other (See Comments)    FAMILY HISTORY: Family History  Problem Relation Age of Onset  . Cancer Mother     bone  . Hypertension Father   . Mitral valve prolapse Father   . Asthma Brother   . Arthritis Brother   . Nephrolithiasis Brother   . Nephrolithiasis Brother   . Asthma Brother   . Arthritis Brother   . Aortic aneurysm Brother     ascending aortic aneuysm    SOCIAL HISTORY: Social History   Social History  . Marital status: Married    Spouse name: N/A  . Number of children: 0  . Years of education: N/A   Occupational History  . retired Public librarian Retired   Social History Main Topics  . Smoking status: Former Research scientist (life sciences)  . Smokeless tobacco: Never Used  . Alcohol use Yes     Comment: rare  . Drug use: No  . Sexual activity: Not on file   Other Topics Concern  . Not on file   Social History Narrative   Regular  exercise--yes, recumbent bike 3-4 days a week      Diet: fruits and veggies, water, eats at home, drinks a lot of milk    REVIEW OF SYSTEMS: Constitutional: No fevers, chills, or sweats, no generalized fatigue, change in appetite Eyes: No visual changes, double vision, eye pain Ear, nose and throat: No hearing loss, ear pain, nasal congestion, sore throat Cardiovascular: No chest pain, palpitations Respiratory:  No shortness of breath at rest or with exertion, wheezes GastrointestinaI: No nausea, vomiting, diarrhea, abdominal pain, fecal incontinence Genitourinary:  No dysuria, urinary retention or frequency Musculoskeletal:  No neck pain, back pain Integumentary: No rash, pruritus, skin lesions Neurological: as above Psychiatric: No depression, insomnia, anxiety Endocrine: No palpitations, fatigue, diaphoresis, mood swings, change in appetite, change in weight, increased thirst Hematologic/Lymphatic:  No purpura, petechiae. Allergic/Immunologic: no itchy/runny eyes, nasal congestion, recent allergic reactions, rashes  PHYSICAL EXAM: Vitals:   05/18/16 0915  BP: 126/68  Pulse: 84   General: No acute distress.  Patient appears well-groomed.  normal body habitus. Head:  Normocephalic/atraumatic Eyes:  Fundi examined but not visualized Neck: supple, no paraspinal tenderness, full range of motion Heart:  Regular rate  and rhythm Lungs:  Clear to auscultation bilaterally Back: No paraspinal tenderness Neurological Exam: alert and oriented to person, place, and time. Attention span and concentration intact, recent and remote memory intact, fund of knowledge intact.  Speech fluent and not dysarthric, language intact.  CN II-XII intact. Bulk and tone normal, muscle strength 5/5 throughout.  Sensation to light touch  intact.  Deep tendon reflexes 2+ throughout.  Finger to nose testing intact.  Gait normal  IMPRESSION: Chronic migraine  PLAN: 1.  She is interested in restarting  methylergonavine, as it was effective in migraine prevention in the past.  However, I never prescribed this medication and do not know how (and not comfortable) in managing it.  She no longer sees a gynecologist.  I will contact Dr. Diona Browner to see if she is comfortable in prescribing this medication.  Otherwise, I recommend restarting propranolol ER at higher dose 160mg . ADDENDUM:  Dr. Diona Browner is not familiar with prescribing this medication either.  Therefore,  I recommend restarting propranolol ER at higher dose of 160mg  daily.  She should monitor for lightheadedness/dizziness, as it can lower blood pressure and heart rate. 2.  We appeal to her insurance company for approval to cover the new Cefaly band. 3.  Sumatriptan as needed. 4.  Follow up in 6 months.  26 minutes spent face to face with patient, over 50% spent discussing treatment options.  Metta Clines, DO  CC:  Eliezer Lofts, MD

## 2016-05-18 NOTE — Telephone Encounter (Signed)
-----   Message from Pieter Partridge, DO sent at 05/18/2016 12:00 PM EST ----- Dr. Diona Browner is not familiar with prescribing methylergovine either.  Therefore, I recommend restarting propranolol ER at higher dose of 160mg  daily (she should monitor for dizziness/lightheadeness, as it may lower blood pressure or heart rate).

## 2016-05-18 NOTE — Patient Instructions (Addendum)
1.  I will contact Dr. Diona Browner to see if she is comfortable prescribing the methylergonovine.  If not, we can retry propranolol at a higher dose.  In meantime, we will appeal for the new Cefaly. 2.  Follow up in 6 months

## 2016-05-19 ENCOUNTER — Encounter: Payer: Self-pay | Admitting: Neurology

## 2016-05-20 MED ORDER — ATENOLOL 50 MG PO TABS: 50.0000 mg | ORAL_TABLET | Freq: Every day | ORAL | 3 refills | Status: DC

## 2016-05-20 NOTE — Telephone Encounter (Signed)
See my chart encounter.

## 2016-05-26 ENCOUNTER — Telehealth: Payer: Self-pay | Admitting: Cardiovascular Disease

## 2016-05-26 NOTE — Telephone Encounter (Signed)
3 attempts to schedule fu from recall list. lmov to call office .  Deleting recall .

## 2016-06-08 ENCOUNTER — Encounter: Payer: Self-pay | Admitting: Family Medicine

## 2016-06-08 ENCOUNTER — Ambulatory Visit (INDEPENDENT_AMBULATORY_CARE_PROVIDER_SITE_OTHER): Payer: Medicare Other | Admitting: Family Medicine

## 2016-06-08 VITALS — BP 122/74 | HR 58 | Temp 98.4°F | Ht 60.5 in | Wt 152.5 lb

## 2016-06-08 DIAGNOSIS — Z298 Encounter for other specified prophylactic measures: Secondary | ICD-10-CM

## 2016-06-08 DIAGNOSIS — R222 Localized swelling, mass and lump, trunk: Secondary | ICD-10-CM | POA: Diagnosis not present

## 2016-06-08 LAB — CBC WITH DIFFERENTIAL/PLATELET
Basophils Absolute: 0 10*3/uL (ref 0.0–0.1)
Basophils Relative: 0.3 % (ref 0.0–3.0)
Eosinophils Absolute: 0.3 10*3/uL (ref 0.0–0.7)
Eosinophils Relative: 2.9 % (ref 0.0–5.0)
HCT: 43.2 % (ref 36.0–46.0)
Hemoglobin: 14.4 g/dL (ref 12.0–15.0)
Lymphocytes Relative: 14.4 % (ref 12.0–46.0)
Lymphs Abs: 1.6 10*3/uL (ref 0.7–4.0)
MCHC: 33.4 g/dL (ref 30.0–36.0)
MCV: 87 fl (ref 78.0–100.0)
Monocytes Absolute: 0.7 10*3/uL (ref 0.1–1.0)
Monocytes Relative: 6.6 % (ref 3.0–12.0)
Neutro Abs: 8.6 10*3/uL — ABNORMAL HIGH (ref 1.4–7.7)
Neutrophils Relative %: 75.8 % (ref 43.0–77.0)
Platelets: 318 10*3/uL (ref 150.0–400.0)
RBC: 4.97 Mil/uL (ref 3.87–5.11)
RDW: 16.8 % — ABNORMAL HIGH (ref 11.5–15.5)
WBC: 11.4 10*3/uL — ABNORMAL HIGH (ref 4.0–10.5)

## 2016-06-08 MED ORDER — ALPRAZOLAM 0.25 MG PO TABS: ORAL_TABLET | ORAL | 0 refills | Status: DC

## 2016-06-08 NOTE — Progress Notes (Signed)
Pre visit review using our clinic review tool, if applicable. No additional management support is needed unless otherwise documented below in the visit note. 

## 2016-06-08 NOTE — Progress Notes (Signed)
   Subjective:    Patient ID: Debra Leach, female    DOB: [birth year], 66 y.o.   MRN: [id number]  HPI  66 year old female presents for new onset swelling at base of her neck.   She reports new onset swelling in bilateral neck... Now decreased some Now mainly in  right lower neck.  Sore in first 48 hours.. Has gradually improved , now non tender. Only associated symptom in first 48 hours.. She did have sore gum ache..similar to soreness with sinus infections.  No heat, no redness.  No rash.  No cold symptoms. No ST, no SOB, no chest pain.  No fever.   No increase with movement of head.  She did take a Treximet for her regular migraine.   Former remote smoker Quit 20 years ago.Marland Kitchen Hx of 12 pack year history.  Review of Systems  Constitutional: Negative for fatigue and fever.  HENT: Negative for ear pain.   Eyes: Negative for pain.  Respiratory: Negative for chest tightness and shortness of breath.   Cardiovascular: Negative for chest pain, palpitations and leg swelling.  Gastrointestinal: Negative for abdominal pain.  Genitourinary: Negative for dysuria.       Objective:   Physical Exam  Constitutional: Vital signs are normal. She appears well-developed and well-nourished. She is cooperative.  Non-toxic appearance. She does not appear ill. No distress.  HENT:  Head: Normocephalic.  Right Ear: Hearing, tympanic membrane, external ear and ear canal normal. Tympanic membrane is not erythematous, not retracted and not bulging.  Left Ear: Hearing, tympanic membrane, external ear and ear canal normal. Tympanic membrane is not erythematous, not retracted and not bulging.  Nose: No mucosal edema or rhinorrhea. Right sinus exhibits no maxillary sinus tenderness and no frontal sinus tenderness. Left sinus exhibits no maxillary sinus tenderness and no frontal sinus tenderness.  Mouth/Throat: Uvula is midline, oropharynx is clear and moist and mucous membranes are normal.  Eyes:  Conjunctivae, EOM and lids are normal. Pupils are equal, round, and reactive to light. Lids are everted and swept, no foreign bodies found.  Neck: Trachea normal and normal range of motion. Neck supple. No spinous process tenderness and no muscular tenderness present. Carotid bruit is not present. No neck rigidity. No edema, no erythema and normal range of motion present. No thyroid mass and no thyromegaly present.  Bilateral supraclavicular fulllness, no focal mass noted, tissue if soft and diffuse, feels fatty.  Cardiovascular: Normal rate, regular rhythm, S1 normal, S2 normal, normal heart sounds, intact distal pulses and normal pulses.  Exam reveals no gallop and no friction rub.   No murmur heard. Pulmonary/Chest: Effort normal and breath sounds normal. No tachypnea. No respiratory distress. She has no decreased breath sounds. She has no wheezes. She has no rhonchi. She has no rales.  Abdominal: Soft. Normal appearance and bowel sounds are normal. There is no tenderness.  Neurological: She is alert.  Skin: Skin is warm, dry and intact. No rash noted.  Psychiatric: Her speech is normal and behavior is normal. Judgment and thought content normal. Her mood appears not anxious. Cognition and memory are normal. She does not exhibit a depressed mood.          Assessment & Plan:   She is anxious and clautstrophobic about procedure.. rx for anxoilytic given.

## 2016-06-08 NOTE — Addendum Note (Signed)
Addended by: Royann Shivers A on: 06/08/2016 10:32 AM   Modules accepted: Orders

## 2016-06-08 NOTE — Assessment & Plan Note (Signed)
Unclear cause.. is bilateral. Has improved possibly due to NSAID. Pt feels well, but has history of  Remote smoking.  Will eval with [facility] and send for neck CT to eval further.

## 2016-06-08 NOTE — Patient Instructions (Signed)
Please stop at the front desk and lab to set up referral and to have labs drawn.

## 2016-06-10 ENCOUNTER — Other Ambulatory Visit (INDEPENDENT_AMBULATORY_CARE_PROVIDER_SITE_OTHER): Payer: Medicare Other

## 2016-06-10 DIAGNOSIS — Z2989 Encounter for other specified prophylactic measures: Secondary | ICD-10-CM

## 2016-06-10 DIAGNOSIS — Z298 Encounter for other specified prophylactic measures: Secondary | ICD-10-CM | POA: Diagnosis not present

## 2016-06-10 LAB — BUN: BUN: 17 mg/dL (ref 6–23)

## 2016-06-10 LAB — CREATININE, SERUM: Creatinine, Ser: 0.95 mg/dL (ref 0.40–1.20)

## 2016-06-10 NOTE — Addendum Note (Signed)
Addended by: Ellamae Sia on: 06/10/2016 12:39 PM   Modules accepted: Orders

## 2016-06-11 ENCOUNTER — Encounter: Payer: Self-pay | Admitting: Family Medicine

## 2016-06-11 ENCOUNTER — Ambulatory Visit (INDEPENDENT_AMBULATORY_CARE_PROVIDER_SITE_OTHER)
Admission: RE | Admit: 2016-06-11 | Discharge: 2016-06-11 | Disposition: A | Discharge status: Home / Self Care | Payer: Medicare Other | Source: Ambulatory Visit | Attending: Family Medicine | Admitting: Family Medicine

## 2016-06-11 DIAGNOSIS — R222 Localized swelling, mass and lump, trunk: Secondary | ICD-10-CM

## 2016-06-11 DIAGNOSIS — I700 Atherosclerosis of aorta: Secondary | ICD-10-CM | POA: Insufficient documentation

## 2016-06-11 LAB — BUN+CREAT: BUN/Creatinine Ratio: 17.9 Ratio (ref 6–22)

## 2016-06-11 LAB — BUN: BUN: 15 mg/dL (ref 7–25)

## 2016-06-11 LAB — CREATININE, SERUM: Creat: 0.84 mg/dL (ref 0.50–0.99)

## 2016-06-11 MED ORDER — IOPAMIDOL (ISOVUE-300) INJECTION 61%
75.0000 mL | Freq: Once | INTRAVENOUS | Status: AC | PRN
  Administered 2016-06-11: 75 mL via INTRAVENOUS

## 2016-06-16 ENCOUNTER — Encounter: Payer: Self-pay | Admitting: Neurology

## 2016-07-01 ENCOUNTER — Encounter: Payer: Self-pay | Admitting: Neurology

## 2016-07-06 ENCOUNTER — Encounter: Payer: Self-pay | Admitting: Neurology

## 2016-07-07 ENCOUNTER — Other Ambulatory Visit: Payer: Self-pay

## 2016-07-07 MED ORDER — PREDNISONE 10 MG (21) PO TBPK: ORAL_TABLET | ORAL | 0 refills | Status: DC

## 2016-07-08 ENCOUNTER — Other Ambulatory Visit: Payer: Self-pay

## 2016-07-08 MED ORDER — PREDNISONE 10 MG (21) PO TBPK: ORAL_TABLET | ORAL | 0 refills | Status: DC

## 2016-08-04 ENCOUNTER — Telehealth: Payer: Self-pay | Admitting: Family Medicine

## 2016-08-04 DIAGNOSIS — E119 Type 2 diabetes mellitus without complications: Secondary | ICD-10-CM

## 2016-08-04 DIAGNOSIS — E559 Vitamin D deficiency, unspecified: Secondary | ICD-10-CM

## 2016-08-04 NOTE — Telephone Encounter (Signed)
-----   Message from Ellamae Sia sent at 07/30/2016  3:32 PM EST ----- Regarding: Lab orders for Wednesday, 3.7.18 Lab orders for a 6 month follow up appt

## 2016-08-09 ENCOUNTER — Encounter: Payer: Self-pay | Admitting: Neurology

## 2016-08-09 ENCOUNTER — Other Ambulatory Visit: Payer: Self-pay | Admitting: Neurology

## 2016-08-09 NOTE — Telephone Encounter (Signed)
Please advise 

## 2016-08-10 MED ORDER — ATENOLOL 100 MG PO TABS: 100.0000 mg | ORAL_TABLET | Freq: Every day | ORAL | 1 refills | Status: DC

## 2016-08-11 ENCOUNTER — Other Ambulatory Visit (INDEPENDENT_AMBULATORY_CARE_PROVIDER_SITE_OTHER): Payer: Medicare Other

## 2016-08-11 DIAGNOSIS — E119 Type 2 diabetes mellitus without complications: Secondary | ICD-10-CM | POA: Diagnosis not present

## 2016-08-11 LAB — LIPID PANEL
Cholesterol: 252 mg/dL — ABNORMAL HIGH (ref 0–200)
HDL: 65.8 mg/dL (ref >39.00)
LDL Cholesterol: 167 mg/dL — ABNORMAL HIGH (ref 0–99)
NonHDL: 186.14
Total CHOL/HDL Ratio: 4
Triglycerides: 98 mg/dL (ref 0.0–149.0)
VLDL: 19.6 mg/dL (ref 0.0–40.0)

## 2016-08-11 LAB — COMPREHENSIVE METABOLIC PANEL
ALT: 20 U/L (ref 0–35)
AST: 16 U/L (ref 0–37)
Albumin: 3.9 g/dL (ref 3.5–5.2)
Alkaline Phosphatase: 76 U/L (ref 39–117)
BUN: 17 mg/dL (ref 6–23)
CO2: 29 mEq/L (ref 19–32)
Calcium: 9.8 mg/dL (ref 8.4–10.5)
Chloride: 104 mEq/L (ref 96–112)
Creatinine, Ser: 0.85 mg/dL (ref 0.40–1.20)
GFR: 71.28 mL/min (ref >60.00)
Glucose, Bld: 160 mg/dL — ABNORMAL HIGH (ref 70–99)
Potassium: 4.7 mEq/L (ref 3.5–5.1)
Sodium: 139 mEq/L (ref 135–145)
Total Bilirubin: 0.4 mg/dL (ref 0.2–1.2)
Total Protein: 7.1 g/dL (ref 6.0–8.3)

## 2016-08-11 LAB — HEMOGLOBIN A1C: Hgb A1c MFr Bld: 6.8 % — ABNORMAL HIGH (ref 4.6–6.5)

## 2016-08-12 ENCOUNTER — Ambulatory Visit (INDEPENDENT_AMBULATORY_CARE_PROVIDER_SITE_OTHER): Payer: Medicare Other | Admitting: Family Medicine

## 2016-08-12 ENCOUNTER — Encounter: Payer: Self-pay | Admitting: Family Medicine

## 2016-08-12 VITALS — BP 118/70 | HR 86 | Temp 98.9°F | Ht 60.5 in | Wt 152.2 lb

## 2016-08-12 DIAGNOSIS — E119 Type 2 diabetes mellitus without complications: Secondary | ICD-10-CM

## 2016-08-12 DIAGNOSIS — H539 Unspecified visual disturbance: Secondary | ICD-10-CM | POA: Diagnosis not present

## 2016-08-12 DIAGNOSIS — I700 Atherosclerosis of aorta: Secondary | ICD-10-CM

## 2016-08-12 DIAGNOSIS — E782 Mixed hyperlipidemia: Secondary | ICD-10-CM | POA: Diagnosis not present

## 2016-08-12 LAB — MICROALBUMIN / CREATININE URINE RATIO
Creatinine,U: 63.8 mg/dL
Microalb Creat Ratio: 1.1 mg/g (ref 0.0–30.0)
Microalb, Ur: <0.7 mg/dL (ref 0.0–1.9)

## 2016-08-12 NOTE — Assessment & Plan Note (Signed)
Inadequate control on high dose statin along with welchol.  Pt with low chol diet.  Has not tolerated crestor or zetia in past. Continue aggressive lifestyle changes. Follow up in 6 months.

## 2016-08-12 NOTE — Progress Notes (Signed)
Subjective:    Patient ID: Debra Leach, female    DOB: [birth year], 65 y.o.   MRN: [id number]  HPI   66 year old female pt presents for 6 month follow up.  Diabetes:   Good control on low carb diet. Lab Results  Component Value Date   HGBA1C 6.8 (H) 08/11/2016  Using medications without difficulties: Hypoglycemic episodes: Hyperglycemic episodes: Feet problems: Blood Sugars averaging: eye exam within last year:  Elevated Cholesterol:  LDL not at goal < 100 despite being on high dose statin. On lipitor 80 mg daily and welchol. Did not tolerate zetia and crestor. Lab Results  Component Value Date   CHOL 252 (H) 08/11/2016   HDL 65.80 08/11/2016   LDLCALC 167 (H) 08/11/2016   LDLDIRECT 160.8 01/29/2013   TRIG 98.0 08/11/2016   CHOLHDL 4 08/11/2016  Using medications without problems: Muscle aches: none Diet compliance: good, avoiding fried foods. Exercise: walking 2-5 miles a day. Other complaints: Atherosclerosis in aorta noted on imaging... No claudication, no CP,  Still has occ decreased vision extending out from spot to center of vision.Marland Kitchen associated with her migraine.. Had unremarkable MRI brain in 2016 to eval.  Never had carotid stenosis.  Supraclavicular fossa swelling bilaterally. CT neck neg except for nonspecific inflammation. Pt treated with NSAIDs... But she never took.  She reports today that she has swelling off and on .  Having migraines daily, followed by neurologist... She is now atenolol.  Has had a course of oral steroids,  2 days ago had steroid injection in back.  Blood pressure 118/70, pulse 86, temperature 98.9 F (37.2 C), temperature source Oral, height 5' 0.5" (1.537 m), weight 152 lb 4 oz (69.1 kg).  Review of Systems  Constitutional: Negative for fatigue and fever.  HENT: Negative for ear pain.   Eyes: Negative for pain.  Respiratory: Negative for chest tightness and shortness of breath.   Cardiovascular: Negative for chest pain,  palpitations and leg swelling.  Gastrointestinal: Negative for abdominal pain.  Genitourinary: Negative for dysuria.       Objective:   Physical Exam  Constitutional: Vital signs are normal. She appears well-developed and well-nourished. She is cooperative.  Non-toxic appearance. She does not appear ill. No distress.  HENT:  Head: Normocephalic.  Right Ear: Hearing, tympanic membrane, external ear and ear canal normal. Tympanic membrane is not erythematous, not retracted and not bulging.  Left Ear: Hearing, tympanic membrane, external ear and ear canal normal. Tympanic membrane is not erythematous, not retracted and not bulging.  Nose: No mucosal edema or rhinorrhea. Right sinus exhibits no maxillary sinus tenderness and no frontal sinus tenderness. Left sinus exhibits no maxillary sinus tenderness and no frontal sinus tenderness.  Mouth/Throat: Uvula is midline, oropharynx is clear and moist and mucous membranes are normal.  Eyes: Conjunctivae, EOM and lids are normal. Pupils are equal, round, and reactive to light. Lids are everted and swept, no foreign bodies found.  Neck: Trachea normal and normal range of motion. Neck supple. Carotid bruit is not present. No thyroid mass and no thyromegaly present.  Cardiovascular: Normal rate, regular rhythm, S1 normal, S2 normal, normal heart sounds, intact distal pulses and normal pulses.  Exam reveals no gallop and no friction rub.   No murmur heard. Pulmonary/Chest: Effort normal and breath sounds normal. No tachypnea. No respiratory distress. She has no decreased breath sounds. She has no wheezes. She has no rhonchi. She has no rales.  Abdominal: Soft. Normal appearance and bowel sounds are  normal. There is no tenderness.  Neurological: She is alert.  Skin: Skin is warm, dry and intact. No rash noted.  Psychiatric: Her speech is normal and behavior is normal. Judgment and thought content normal. Her mood appears not anxious. Cognition and memory are  normal. She does not exhibit a depressed mood.    Diabetic foot exam: Normal inspection No skin breakdown No calluses  Normal DP pulses Normal sensation to light touch and monofilament Nails normal       Assessment & Plan:

## 2016-08-12 NOTE — Patient Instructions (Addendum)
Continue working on healthy low carb, low cholesterol diet.  Work on regular exercise. Please stop at the front desk to set up referral.

## 2016-08-12 NOTE — Assessment & Plan Note (Signed)
Cholesterol remains not at goal. No clear sign of other atherosclerosis, but given visual change ( most likely due to migraine) in this setting.. Will eval with [place] of carotids.

## 2016-08-12 NOTE — Addendum Note (Signed)
Addended by: Ellamae Sia on: 08/12/2016 10:35 AM   Modules accepted: Orders

## 2016-08-12 NOTE — Progress Notes (Signed)
Pre visit review using our clinic review tool, if applicable. No additional management support is needed unless otherwise documented below in the visit note. 

## 2016-08-12 NOTE — Assessment & Plan Note (Signed)
Good control on diet control. Check microalbumin today.

## 2016-08-31 LAB — HM DIABETES EYE EXAM

## 2016-09-02 ENCOUNTER — Encounter: Payer: Self-pay | Admitting: Family Medicine

## 2016-09-09 ENCOUNTER — Other Ambulatory Visit: Payer: Self-pay | Admitting: Neurology

## 2016-09-10 ENCOUNTER — Other Ambulatory Visit: Payer: Self-pay | Admitting: Family Medicine

## 2016-09-10 ENCOUNTER — Ambulatory Visit: Payer: Medicare Other

## 2016-09-10 DIAGNOSIS — H539 Unspecified visual disturbance: Secondary | ICD-10-CM

## 2016-09-10 LAB — VAS US CAROTID
LEFT ECA DIAS: -9 cm/s
LEFT VERTEBRAL DIAS: 19 cm/s
Left CCA dist dias: -18 cm/s
Left CCA dist sys: -75 cm/s
Left CCA prox dias: 18 cm/s
Left CCA prox sys: 110 cm/s
Left ICA dist dias: 20 cm/s
Left ICA dist sys: 65 cm/s
Left ICA prox dias: -15 cm/s
Left ICA prox sys: -63 cm/s
RIGHT ECA DIAS: 12 cm/s
RIGHT VERTEBRAL DIAS: -21 cm/s
Right CCA prox dias: 27 cm/s
Right CCA prox sys: 148 cm/s
Right cca dist sys: 53 cm/s

## 2016-09-15 ENCOUNTER — Encounter: Payer: Self-pay | Admitting: Neurology

## 2016-09-16 ENCOUNTER — Telehealth: Payer: Self-pay

## 2016-09-16 MED ORDER — "SYRINGE LUER LOCK 25G X 5/8"" 3 ML MISC": 2 refills | Status: DC

## 2016-09-16 MED ORDER — SUMATRIPTAN SUCCINATE 6 MG/0.5ML ~~LOC~~ SOLN: 6.0000 mg | SUBCUTANEOUS | 3 refills | Status: DC | PRN

## 2016-09-16 NOTE — Telephone Encounter (Signed)
Done

## 2016-09-16 NOTE — Telephone Encounter (Signed)
-----   Message from Pieter Partridge, DO sent at 09/16/2016  9:26 AM EDT ----- Debra Leach, Debra Leach uses the auto-injector for sumatriptan.  The pharmacy is out of the auto-injector for sumatriptan.  Could we send a prescription for the regular sumatriptan injections with vial and syringes (6mg  [place], may repeat dose once after 2 hours if needed, not to exceed 2 doses in 24 hours). Thank you

## 2016-10-04 ENCOUNTER — Encounter: Payer: Self-pay | Admitting: Neurology

## 2016-10-07 ENCOUNTER — Encounter: Payer: Self-pay | Admitting: Family Medicine

## 2016-10-07 ENCOUNTER — Ambulatory Visit (INDEPENDENT_AMBULATORY_CARE_PROVIDER_SITE_OTHER): Payer: Medicare Other | Admitting: Family Medicine

## 2016-10-07 DIAGNOSIS — G8929 Other chronic pain: Secondary | ICD-10-CM | POA: Diagnosis not present

## 2016-10-07 DIAGNOSIS — M545 Low back pain, unspecified: Secondary | ICD-10-CM | POA: Insufficient documentation

## 2016-10-07 DIAGNOSIS — R251 Tremor, unspecified: Secondary | ICD-10-CM | POA: Diagnosis not present

## 2016-10-07 LAB — TSH: TSH: 1.63 u[IU]/mL (ref 0.35–4.50)

## 2016-10-07 LAB — COMPREHENSIVE METABOLIC PANEL
ALT: 27 U/L (ref 0–35)
AST: 23 U/L (ref 0–37)
Albumin: 4.1 g/dL (ref 3.5–5.2)
Alkaline Phosphatase: 79 U/L (ref 39–117)
BUN: 15 mg/dL (ref 6–23)
CO2: 31 mEq/L (ref 19–32)
Calcium: 9.9 mg/dL (ref 8.4–10.5)
Chloride: 101 mEq/L (ref 96–112)
Creatinine, Ser: 0.78 mg/dL (ref 0.40–1.20)
GFR: 78.68 mL/min (ref >60.00)
Glucose, Bld: 114 mg/dL — ABNORMAL HIGH (ref 70–99)
Potassium: 3.9 mEq/L (ref 3.5–5.1)
Sodium: 140 mEq/L (ref 135–145)
Total Bilirubin: 0.4 mg/dL (ref 0.2–1.2)
Total Protein: 7.4 g/dL (ref 6.0–8.3)

## 2016-10-07 LAB — T3, FREE: T3, Free: 3.9 pg/mL (ref 2.3–4.2)

## 2016-10-07 LAB — GLUCOSE, POCT (MANUAL RESULT ENTRY): POC Glucose: 117 mg/dl — AB (ref 70–99)

## 2016-10-07 LAB — T4, FREE: Free T4: 1.03 ng/dL (ref 0.60–1.60)

## 2016-10-07 MED ORDER — DICLOFENAC SODIUM 75 MG PO TBEC: 75.0000 mg | DELAYED_RELEASE_TABLET | Freq: Two times a day (BID) | ORAL | 0 refills | Status: DC

## 2016-10-07 NOTE — Progress Notes (Signed)
Pre visit review using our clinic review tool, if applicable. No additional management support is needed unless otherwise documented below in the visit note. 

## 2016-10-07 NOTE — Patient Instructions (Addendum)
Please stop at the lab to set up to have labs drawn.  Stop gabapentin.

## 2016-10-07 NOTE — Progress Notes (Signed)
Subjective:    Patient ID: Debra Leach, female    DOB: [birth year], 66 y.o.   MRN: [id number]  HPI    66 year old female presents with new onset tremors in arms.   She is tremulous in the office today.  She has had three episodes prior to OV today.   Sometime between 4/4 and 4/25.  4/25 episode lasting 1 hours 4/29 episode lasting 2 hours Each time she has a severe migraine.,  IN office she has a migraine beginning today.  She does have daily. She has severe migranes and loses her vision with migraine. No new numbness, no weakness. No associated chest pain, SOB.. Today in office HR heart rate is up and she is sweating.  She denies anxiety. No gait changes.  No fall, no head trauma. No caffeine or ETOH use.  She is taking gabapentin 300 mg twice a day.Marland Kitchen Has been on this for years.  Only recent med change is stopping atenolol 2-3 weeks ago. After the first episode of tremor.  Blood pressure 140/80, pulse 99, temperature 98.9 F (37.2 C), temperature source Oral, height 5' 0.5" (1.537 m), weight 157 lb 8 oz (71.4 kg). '   MRI  Brain with and without Review of Systems  Constitutional: Negative for fatigue and fever.  HENT: Negative for ear pain.   Eyes: Negative for pain.  Respiratory: Negative for chest tightness and shortness of breath.   Cardiovascular: Negative for chest pain, palpitations and leg swelling.  Gastrointestinal: Negative for abdominal pain.  Genitourinary: Negative for dysuria.  Musculoskeletal: Positive for back pain.       Objective:   Physical Exam  Constitutional: Vital signs are normal. She appears well-developed and well-nourished. She is cooperative.  Non-toxic appearance. She does not appear ill. No distress.  HENT:  Head: Normocephalic.  Right Ear: Hearing, tympanic membrane, external ear and ear canal normal. Tympanic membrane is not erythematous, not retracted and not bulging.  Left Ear: Hearing, tympanic membrane, external ear and  ear canal normal. Tympanic membrane is not erythematous, not retracted and not bulging.  Nose: No mucosal edema or rhinorrhea. Right sinus exhibits no maxillary sinus tenderness and no frontal sinus tenderness. Left sinus exhibits no maxillary sinus tenderness and no frontal sinus tenderness.  Mouth/Throat: Uvula is midline, oropharynx is clear and moist and mucous membranes are normal.  Eyes: Conjunctivae, EOM and lids are normal. Pupils are equal, round, and reactive to light. Lids are everted and swept, no foreign bodies found.  Neck: Trachea normal and normal range of motion. Neck supple. Carotid bruit is not present. No thyroid mass and no thyromegaly present.  Cardiovascular: Regular rhythm, S1 normal, S2 normal, normal heart sounds, intact distal pulses and normal pulses.  Tachycardia present.  Exam reveals no gallop and no friction rub.   No murmur heard.  Sweating on face  Pulmonary/Chest: Effort normal and breath sounds normal. No tachypnea. No respiratory distress. She has no decreased breath sounds. She has no wheezes. She has no rhonchi. She has no rales.  Abdominal: Soft. Normal appearance and bowel sounds are normal. There is no tenderness.  Neurological: She is alert. She has normal strength. No sensory deficit. Coordination normal.  Pt with gross tremor/jerking in arms and hands bilaterally. When she puts her arms out she flaps her hands.  Fine tremor /jumping in right lower leg.   Skin: Skin is warm, dry and intact. No rash noted.  Psychiatric: Her speech is normal and behavior is normal.  Judgment and thought content normal. Her mood appears anxious. Cognition and memory are normal. She does not exhibit a depressed mood.          Assessment & Plan:

## 2016-10-07 NOTE — Assessment & Plan Note (Addendum)
New onset, intermittant, possibly ssociated with migraine.  She denies anxiety or stress. Not clearly worrisome for CVA given intermittant. No known head injury.  Will check labs including TSH, CMET , ceruloplasmin. ? If related to stopping BBlocker as these are used in tremor. Gabapentin can have SE .Marland Kitchen ? Causing. Less likely serotonin syndrome? From triptans? Glucose in office 117  Discussed with Dr. Tomi Likens Plan to stop gabapentin and to eval with labs. He will call to move her follow up sooner.

## 2016-10-07 NOTE — Assessment & Plan Note (Signed)
Acute flare.. Treat with diclofenac, if not improving return for follow up for further eval.

## 2016-10-11 ENCOUNTER — Encounter: Payer: Self-pay | Admitting: Neurology

## 2016-10-11 ENCOUNTER — Ambulatory Visit (INDEPENDENT_AMBULATORY_CARE_PROVIDER_SITE_OTHER): Payer: Medicare Other | Admitting: Neurology

## 2016-10-11 VITALS — BP 124/74 | HR 124 | Ht 60.5 in | Wt 154.0 lb

## 2016-10-11 DIAGNOSIS — G43709 Chronic migraine without aura, not intractable, without status migrainosus: Secondary | ICD-10-CM

## 2016-10-11 DIAGNOSIS — F444 Conversion disorder with motor symptom or deficit: Secondary | ICD-10-CM

## 2016-10-11 LAB — CERULOPLASMIN: Ceruloplasmin: 37 mg/dL (ref 18–53)

## 2016-10-11 NOTE — Patient Instructions (Signed)
1.  Restart the gabapentin because it is not the cause of the tremor 2.  Contact me in one week and if you are doing okay, we can start cyproheptadine, which is an antihistamine that is used for migraine prevention. 3.  Continue Cefaly 4.  Follow up in 3 months.

## 2016-10-11 NOTE — Progress Notes (Signed)
NEUROLOGY FOLLOW UP OFFICE NOTE  Debra Leach [id number]  HISTORY OF PRESENT ILLNESS: Debra Leach is a 66 year old right-handed female with chronic migraines, hyperlipidemia, diabetes, GERD, osteoarthritis, and history of MVP and nephrolithiasis who follows up for tremors and migraines.  UPDATE: Abnormal Movements: In December, we started atenolol for migraine prevention.  It worked well, but due to reported side effects of dizziness, acid reflux and joint and muscle pain, she was tapered off of it in early April.  Around that time, she developed shaking episodes or tremors associated with her migraines.  She followed up with her PCP, Dr. Diona Browner, on 10/08/16 and we had her discontinue the gabapentin.  She hasn't had any recurrent tremors, however she says she has gone up to 4 days at a time without an attack.  Since discontinuing the gabapentin, the intensity of her daily headaches have gotten worse.    I reviewed the video of her habitual attacks.  In the video, she exhibits flapping of her right hand, arrhythmic and without tremor or choreiform movement.  It is not an arrhythmic jerking, consistent with myoclonus.  It sometimes involves the left hand as well.  She denies stress and anxiety.  10/07/16 Labs:  CMP with Na 140, K 3.9, Cl 101, CO2 31, glucose 114, BUN 15, Cr 0.8, total bili 0.4, ALP 79, AST 23, and ALT 23; TSH 1.63, free T4 1.03, free T3 3.9.   Migraines: Constant 4-6/10 headache.  Severe migraines: Intensity:  8-10/10 Duration:  Around 2 hours with sumatriptan 6mg  [place] Frequency:  4 days per week Current NSAIDS:  none Current analgesics:  none Current triptans:  sumatriptan 100mg , sumatriptan 6mg  [place] Current anti-emetic:  Zofran 4mg . Meclizine for dizziness. Current muscle relaxants:  none Current Antihypertensive medications:  Indapamide Current Antidepressant medications:  none Current Anticonvulsant medications:  no. Current Vitamins/Herbal/Supplements:  magnesium  400mg , riboflavin, coenzyme Q10 Will still use Cefaly device as abortive therapy but would like to get the new preventative one.   Caffeine:  1 cup coffee daily (no caffeine made no difference) Smoker:  no Diet:  Follows LEAP ImmunoCalm Dietary Program Exercise:  When tolerated (due to knee problem).  She walks.  She also bikes. Depression/stress:  stable Sleep hygiene:  poor   HISTORY: Onset:  66 years old Location:  Varies (unilateral either side, frontal-temporal, back of head) Quality:  Varies (stabbing, pounding, throbbing) Initial Intensity:  10/10 severe, otherwise 5-7/10 Aura:  Occurs off and on over the years and varies in semiology.  In early 1970s, black out.  In late 1980s, white out.  In late 1990s, flashing lights and zigzag lines.  Since 2016, scintillating scotoma (occurs 1 to 2 times a month) Prodrome:  no Associated symptoms:  Initially vomiting.  Sometimes nausea.  Photophobia, phonophobia.  Dizziness. Initial Duration:  All day Initial Frequency:  daily Triggers/exacerbating factors:  Change in weather, stress, valsalva, odors, certain foods  Relieving factors:  Ice, rest Activity:  Difficult to function if severe   Past NSAIDS:  diclofenac 50mg , Cambia, Mobic 15mg , ibuprofen, naproxen, toradol  Past analgesics:  tramadol (reaction), Midrin, Excedrin Past abortive triptans/ergots:  Treximet, Axert, Amerge, Frova, Maxalt, Relpax, Zomig tablet, DHE NS Past anxiolytic:  Buspirone, clonazepam Past antihypertensive medications:  Metoprolol, Norvasc, propranolol ER 120mg , atenolol 100mg  (side effects dizziness, myalgias, acid reflux) Past antidepressant medications:  Venlafaxine, sertraline, amitriptyline, amoxapine, duloxetine, nortriptyline, imipramine, Luvox, Remeron, Serzone, bupropion, desipramine, doxepin, Vivactil Past anticonvulsant medications:  Depakote, Topamax, zonisamide, Lamictal, Keppra, Lyrica, gabapentin  300/300/600 (stopped due to myoclonus) Past  vitamins/Herbal/Supplements:  butterbur Other past treatments:  Botox (2 years), Cefaly Device, methylergonovine, acupuncture, biofeedback, Seroquel, Thorazine, methylsergide maleate     Family history of headache:  Maybe her mother's side.   Prior brain MRI normal (date unknown but report is mentioned in prior notes).  PAST MEDICAL HISTORY: Past Medical History:  Diagnosis Date  . Arthritis    osteoarthritis B knees, left hip , right elbow  . GERD (gastroesophageal reflux disease)   . Hyperlipidemia   . MVP (mitral valve prolapse)    history of    MEDICATIONS: Current Outpatient Prescriptions on File Prior to Visit  Medication Sig Dispense Refill  . atorvastatin (LIPITOR) 80 MG tablet Take 1 tablet (80 mg total) by mouth daily. 90 tablet 3  . cholecalciferol (VITAMIN D) 1000 units tablet Take 1,000 Units by mouth daily.    . cimetidine (TAGAMET) 200 MG tablet Take 200 mg by mouth daily as needed.    . clotrimazole-betamethasone (LOTRISONE) cream Apply 1 application topically as needed. Please given in 2 x 15 gm tubes 30 g 1  . colesevelam (WELCHOL) 625 MG tablet TAKE 3 TABLETS BY MOUTH TWO TIMES A DAY WITH MEALS 180 tablet 11  . diclofenac (VOLTAREN) 75 MG EC tablet Take 1 tablet (75 mg total) by mouth 2 (two) times daily. 30 tablet 0  . indapamide (LOZOL) 1.25 MG tablet Take 1 tablet (1.25 mg total) by mouth every morning. 90 tablet 3  . Magnesium 400 MG TABS Take 1 tablet by mouth daily.     . meclizine (ANTIVERT) 25 MG tablet Take 25-50 mg by mouth as needed for dizziness.    . ondansetron (ZOFRAN) 4 MG tablet Take 1 tablet (4 mg total) by mouth as needed for nausea. 20 tablet 3  . Riboflavin (VITAMIN B2 PO) Take 400 mg by mouth 3 (three) times daily.    . SUMAtriptan (IMITREX) 100 MG tablet TAKE ONE TABLET AS NEEDED FOR MIGRAINE. MAY REPEAT IN TWO HOURS IF NEEDED *MAX OF 2 TABLETS IN 24 HOURS* 10 tablet 5  . SUMAtriptan (IMITREX) 6 MG/0.5ML SOLN injection Inject 0.5 mLs (6 mg  total) into the skin every 2 (two) hours as needed for migraine (May repeast dose once after 2 hrs if needed, not to exceed 2 doses in 24 hours). 4.5 mL 3  . Syringe/Needle, Disp, (SYRINGE LUER LOCK) 25G X 5/8" 3 ML MISC Use as needed 50 each 2   No current facility-administered medications on file prior to visit.     ALLERGIES: Allergies  Allergen Reactions  . Codeine     REACTION: Migraine  . Penicillins     REACTION: Itching  . Pentazocine Lactate     REACTION: Swelling, itching, rash  . Crestor [Rosuvastatin] Other (See Comments)    FAMILY HISTORY: Family History  Problem Relation Age of Onset  . Cancer Mother     bone  . Hypertension Father   . Mitral valve prolapse Father   . Asthma Brother   . Arthritis Brother   . Nephrolithiasis Brother   . Nephrolithiasis Brother   . Asthma Brother   . Arthritis Brother   . Aortic aneurysm Brother     ascending aortic aneuysm    SOCIAL HISTORY: Social History   Social History  . Marital status: Married    Spouse name: N/A  . Number of children: 0  . Years of education: N/A   Occupational History  . retired Public librarian Retired  Social History Main Topics  . Smoking status: Former Research scientist (life sciences)  . Smokeless tobacco: Never Used  . Alcohol use Yes     Comment: rare  . Drug use: No  . Sexual activity: Not on file   Other Topics Concern  . Not on file   Social History Narrative   Regular exercise--yes, recumbent bike 3-4 days a week      Diet: fruits and veggies, water, eats at home, drinks a lot of milk    REVIEW OF SYSTEMS: Constitutional: No fevers, chills, or sweats, no generalized fatigue, change in appetite Eyes: No visual changes, double vision, eye pain Ear, nose and throat: No hearing loss, ear pain, nasal congestion, sore throat Cardiovascular: No chest pain, palpitations Respiratory:  No shortness of breath at rest or with exertion, wheezes GastrointestinaI: No nausea, vomiting, diarrhea, abdominal  pain, fecal incontinence Genitourinary:  No dysuria, urinary retention or frequency Musculoskeletal:  No neck pain, back pain Integumentary: No rash, pruritus, skin lesions Neurological: as above Psychiatric: No depression, insomnia, anxiety Endocrine: No palpitations, fatigue, diaphoresis, mood swings, change in appetite, change in weight, increased thirst Hematologic/Lymphatic:  No purpura, petechiae. Allergic/Immunologic: no itchy/runny eyes, nasal congestion, recent allergic reactions, rashes  PHYSICAL EXAM: Vitals:   10/11/16 1339  BP: 124/74  Pulse: (!) 124   General: No acute distress.  Patient appears well-groomed.  Head:  Normocephalic/atraumatic Eyes:  Fundi examined but not visualized Neck: supple, no paraspinal tenderness, full range of motion Heart:  Regular rate and rhythm Lungs:  Clear to auscultation bilaterally Back: No paraspinal tenderness Neurological Exam: alert and oriented to person, place, and time. Attention span and concentration intact, recent and remote memory intact, fund of knowledge intact.  Speech fluent and not dysarthric, language intact.  CN II-XII intact. Bulk and tone normal, muscle strength 5/5 throughout.  Sensation to light touch  intact.  Deep tendon reflexes 2+ throughout, toes downgoing.  Finger to nose testing intact.  Gait normal  IMPRESSION: 1.  Psychogenic tremor/movements.  This movement does not appear to be a  Physiologic tremor or movement disorder.  It is not consistent with partial seizure.  It is not myoclonus and I don't think it is secondary to gabapentin.  It is non-focal and therefore I don't think MRI of brain is indicated.  She denies any stress. 2.  Chronic migraine, worse since gabapentin was discontinued.  PLAN: 1.  She may resume gabapentin, as this is not a side effect. 2.  She will contact me in one week after restarting gabapentin.  If things are stable, we can try starting cyproheptadine as a preventative. 3.  She  will continue Cefaly as a preventative and abortive therapy. 4.  Follow up in 3 months.  25 minutes spent face to face with patient, over 50% spent discussing diagnosis and management.  Metta Clines, DO  CC:  Eliezer Lofts, MD

## 2016-10-14 ENCOUNTER — Encounter: Payer: Self-pay | Admitting: Family Medicine

## 2016-10-15 ENCOUNTER — Encounter: Payer: Self-pay | Admitting: Family Medicine

## 2016-10-17 ENCOUNTER — Encounter: Payer: Self-pay | Admitting: Neurology

## 2016-10-18 ENCOUNTER — Other Ambulatory Visit: Payer: Self-pay | Admitting: Neurology

## 2016-10-18 MED ORDER — ALPRAZOLAM 0.25 MG PO TABS: 0.2500 mg | ORAL_TABLET | Freq: Two times a day (BID) | ORAL | 0 refills | Status: DC | PRN

## 2016-10-18 MED ORDER — CYPROHEPTADINE HCL 4 MG PO TABS: 4.0000 mg | ORAL_TABLET | Freq: Three times a day (TID) | ORAL | 2 refills | Status: DC

## 2016-10-18 NOTE — Progress Notes (Signed)
Sent prescription for cyproheptadine 4mg  three times daily for migraine prevention to [facility]

## 2016-10-18 NOTE — Telephone Encounter (Signed)
sent pt mychart message.

## 2016-10-19 NOTE — Telephone Encounter (Signed)
Alprazolam called into [facility] as patient requested.

## 2016-10-21 ENCOUNTER — Telehealth: Payer: Self-pay

## 2016-10-21 NOTE — Telephone Encounter (Signed)
Cyproheptad tab 4mg  is approved through 06/06/2017

## 2016-10-22 ENCOUNTER — Encounter: Payer: Self-pay | Admitting: Neurology

## 2016-10-26 ENCOUNTER — Encounter: Payer: Self-pay | Admitting: Neurology

## 2016-11-07 ENCOUNTER — Encounter: Payer: Self-pay | Admitting: Neurology

## 2016-11-08 ENCOUNTER — Other Ambulatory Visit: Payer: Self-pay | Admitting: Neurology

## 2016-11-17 ENCOUNTER — Ambulatory Visit: Payer: Medicare Other | Admitting: Neurology

## 2016-11-22 ENCOUNTER — Ambulatory Visit (INDEPENDENT_AMBULATORY_CARE_PROVIDER_SITE_OTHER): Payer: Medicare Other | Admitting: Internal Medicine

## 2016-11-22 ENCOUNTER — Encounter: Payer: Self-pay | Admitting: Internal Medicine

## 2016-11-22 VITALS — BP 130/82 | HR 88 | Temp 98.4°F | Wt 153.5 lb

## 2016-11-22 DIAGNOSIS — R1310 Dysphagia, unspecified: Secondary | ICD-10-CM

## 2016-11-22 DIAGNOSIS — R1319 Other dysphagia: Secondary | ICD-10-CM

## 2016-11-22 MED ORDER — OMEPRAZOLE 20 MG PO CPDR: 20.0000 mg | DELAYED_RELEASE_CAPSULE | Freq: Two times a day (BID) | ORAL | 3 refills | Status: DC

## 2016-11-22 NOTE — Assessment & Plan Note (Signed)
And hoarseness Chronic undertreated GERD Will start omeprazole bid---if not better in 2-3 weeks, will need GI ([facility]) Can decrease to daily if symptoms gone in a month--- but stay on

## 2016-11-22 NOTE — Patient Instructions (Signed)
Please take the omeprazole twice a day on an empty stomach. If your symptoms are not much better within 2-3 weeks, set up with Dr Earlean Shawl. If your symptoms are basically resolved by 1 month, you can decrease to once a day (but stay on it)

## 2016-11-22 NOTE — Progress Notes (Signed)
Subjective:    Patient ID: Debra Leach, female    DOB: 1951-01-20, 66 y.o.   MRN: [id number]  HPI Here due to dysphagia "things are getting hung in my throat" Going on some weeks, but now worse in the last couple of days Hoarseness and nasal congestion When swallows water--will come out her nose----just the past 3 days  Intermittent heartburn--not bad recently Cimetidine is just as needed Has cut back on coffee--this seemed to help the reflux  Did have 2 week trial of omeprazole for increased symptoms---helped (but then she stopped it)  Current Outpatient Prescriptions on File Prior to Visit  Medication Sig Dispense Refill  . ALPRAZolam (XANAX) 0.25 MG tablet Take 1 tablet (0.25 mg total) by mouth 2 (two) times daily as needed for anxiety. 20 tablet 0  . atorvastatin (LIPITOR) 80 MG tablet Take 1 tablet (80 mg total) by mouth daily. 90 tablet 3  . cholecalciferol (VITAMIN D) 1000 units tablet Take 1,000 Units by mouth daily.    . cimetidine (TAGAMET) 200 MG tablet Take 200 mg by mouth daily as needed.    . clotrimazole-betamethasone (LOTRISONE) cream Apply 1 application topically as needed. Please given in 2 x 15 gm tubes 30 g 1  . colesevelam (WELCHOL) 625 MG tablet TAKE 3 TABLETS BY MOUTH TWO TIMES A DAY WITH MEALS 180 tablet 11  . diclofenac (VOLTAREN) 75 MG EC tablet Take 1 tablet (75 mg total) by mouth 2 (two) times daily. (Patient taking differently: Take 75 mg by mouth 2 (two) times daily as needed. ) 30 tablet 0  . gabapentin (NEURONTIN) 300 MG capsule TAKE TWO CAPSULES BY MOUTH TWO TIMES A DAY 120 capsule 0  . indapamide (LOZOL) 1.25 MG tablet Take 1 tablet (1.25 mg total) by mouth every morning. 90 tablet 3  . Magnesium 400 MG TABS Take 1 tablet by mouth daily.     . meclizine (ANTIVERT) 25 MG tablet Take 25-50 mg by mouth as needed for dizziness.    . ondansetron (ZOFRAN) 4 MG tablet TAKE ONE (1) TABLET BY MOUTH AS NEEDED FOR NASUEA. 20 tablet 0  . Riboflavin  (VITAMIN B2 PO) Take 400 mg by mouth 3 (three) times daily.    . SUMAtriptan (IMITREX) 100 MG tablet TAKE ONE TABLET AS NEEDED FOR MIGRAINE. MAY REPEAT IN TWO HOURS IF NEEDED *MAX OF 2 TABLETS IN 24 HOURS* 10 tablet 5  . SUMAtriptan (IMITREX) 6 MG/0.5ML SOLN injection Inject 0.5 mLs (6 mg total) into the skin every 2 (two) hours as needed for migraine (May repeast dose once after 2 hrs if needed, not to exceed 2 doses in 24 hours). 4.5 mL 3  . Syringe/Needle, Disp, (SYRINGE LUER LOCK) 25G X 5/8" 3 ML MISC Use as needed 50 each 2   No current facility-administered medications on file prior to visit.     Allergies  Allergen Reactions  . Codeine     REACTION: Migraine  . Penicillins     REACTION: Itching  . Pentazocine Lactate     REACTION: Swelling, itching, rash  . Crestor [Rosuvastatin] Other (See Comments)    Past Medical History:  Diagnosis Date  . Arthritis    osteoarthritis B knees, left hip , right elbow  . GERD (gastroesophageal reflux disease)   . Hyperlipidemia   . MVP (mitral valve prolapse)    history of    Past Surgical History:  Procedure Laterality Date  . arm surgery    . BREAST SURGERY  2000  breast biopsy (benign)  . LITHOTRIPSY  08-12-2008   stent placed bilaterally  . PARTIAL HYSTERECTOMY     Both ovaries remain, vaginal, for mennorhagia  . TONSILLECTOMY    . TUBAL LIGATION  1980    Family History  Problem Relation Age of Onset  . Cancer Mother        bone  . Hypertension Father   . Mitral valve prolapse Father   . Asthma Brother   . Arthritis Brother   . Nephrolithiasis Brother   . Nephrolithiasis Brother   . Asthma Brother   . Arthritis Brother   . Aortic aneurysm Brother        ascending aortic aneuysm    Social History   Social History  . Marital status: Married    Spouse name: N/A  . Number of children: 0  . Years of education: N/A   Occupational History  . retired Public librarian Retired   Social History Main Topics  .  Smoking status: Former Research scientist (life sciences)  . Smokeless tobacco: Never Used  . Alcohol use Yes     Comment: rare  . Drug use: No  . Sexual activity: Not on file   Other Topics Concern  . Not on file   Social History Narrative   Regular exercise--yes, recumbent bike 3-4 days a week      Diet: fruits and veggies, water, eats at home, drinks a lot of milk   Review of Systems Appetite is good Has lost some weight-- relates to the preventative med for migraines (gains weight when taking them) No seasonal allergies note Quit smoking about 25 years ago    Objective:   Physical Exam  HENT:  Mouth/Throat: Oropharynx is clear and moist. No oropharyngeal exudate.  Neck: No thyromegaly present.  Cardiovascular: Normal rate, regular rhythm and normal heart sounds.  Exam reveals no gallop.   No murmur heard. Pulmonary/Chest: Effort normal and breath sounds normal. No respiratory distress. She has no wheezes. She has no rales.  Abdominal: Soft. She exhibits no distension. There is no tenderness. There is no rebound and no guarding.  Lymphadenopathy:    She has no cervical adenopathy.          Assessment & Plan:

## 2016-12-07 ENCOUNTER — Encounter: Payer: Self-pay | Admitting: *Deleted

## 2016-12-07 ENCOUNTER — Encounter: Payer: Self-pay | Admitting: Neurology

## 2016-12-09 ENCOUNTER — Other Ambulatory Visit: Payer: Self-pay | Admitting: Neurology

## 2016-12-11 ENCOUNTER — Other Ambulatory Visit: Payer: Self-pay | Admitting: Family Medicine

## 2016-12-13 ENCOUNTER — Encounter: Payer: Self-pay | Admitting: Neurology

## 2016-12-14 ENCOUNTER — Encounter: Payer: Self-pay | Admitting: Neurology

## 2016-12-14 ENCOUNTER — Telehealth: Payer: Self-pay | Admitting: Family Medicine

## 2016-12-14 ENCOUNTER — Telehealth: Payer: Self-pay

## 2016-12-14 NOTE — Telephone Encounter (Signed)
See phone note for today; Dr Diona Browner already advised.

## 2016-12-14 NOTE — Telephone Encounter (Signed)
Patient Name: PINKIE MANGER DOB: 11-15-1950 Initial Comment Caller states she has ear pressure, decreased hearing and dizziness Nurse Assessment Nurse: Joline Salt, RN, Malachy Mood Date/Time (Eastern Time): 12/14/2016 12:58:17 PM Confirm and document reason for call. If symptomatic, describe symptoms. ---Caller states she has ear pressure, decreased hearing and dizziness for the last few months. Caller has daily migraines but this has gotten increasingly worse the last 6 weeks. The dizziness is now bad she has to grasp furniture. No fever. Several weeks ago saw associate of her MD and was treated for inflamed larynx. No ear pain. Does the patient have any new or worsening symptoms? ---Yes Will a triage be completed? ---Yes Related visit to physician within the last 2 weeks? ---Yes Does the PT have any chronic conditions? (i.e. diabetes, asthma, etc.) ---Yes List chronic conditions. ---migraines, diabetes controlled with diet and exercise Is this a behavioral health or substance abuse call? ---No Guidelines Guideline Title Affirmed Question Affirmed Notes Dizziness - Vertigo [1] Dizziness (vertigo) present now AND [2] age > 77 (Exception: prior physician evaluation for this AND no different/worse than usual) Final Disposition User Go to ED Now (or PCP triage) Joline Salt, RN, Cheryl Comments Spoke with Ozzie Hoyle, LPN at Dr [facility] office. She will contact patient. Referrals REFERRED TO PCP OFFICE Disagree/Comply: Comply

## 2016-12-14 NOTE — Telephone Encounter (Signed)
Sherry with [facility] said pt called with ear pressure, decreased hearing and dizziness with ED disposition which pt denied going to ED. I spoke with pt for years had migraines; for several months dizziness, decreased hearing and ear pressure; last few weeks dizziness and ear pressure worsened. Some days holds to furniture to walk in pts home but not today.No CP,SOB,H/A ; no vision changes now, no weakness in extremities. Pt wants appt with Dr Diona Browner; scheduled 12/16/16 at 11:45 and if condition worsens pt will cb. FYI to Dr Diona Browner.

## 2016-12-14 NOTE — Telephone Encounter (Signed)
Noted.   FYI to [employer].

## 2016-12-15 ENCOUNTER — Telehealth: Payer: Self-pay | Admitting: Neurology

## 2016-12-15 NOTE — Telephone Encounter (Signed)
Paperwork up front and ready.

## 2016-12-15 NOTE — Telephone Encounter (Signed)
Caller: [name]   Urgent? No   Reason for the call: Calling regarding paperwork that would be upfront for her. Thanks

## 2016-12-16 ENCOUNTER — Ambulatory Visit (INDEPENDENT_AMBULATORY_CARE_PROVIDER_SITE_OTHER): Payer: Medicare Other | Admitting: Family Medicine

## 2016-12-16 VITALS — BP 116/74 | HR 69 | Temp 97.8°F | Ht 60.05 in | Wt 152.2 lb

## 2016-12-16 DIAGNOSIS — R42 Dizziness and giddiness: Secondary | ICD-10-CM | POA: Insufficient documentation

## 2016-12-16 DIAGNOSIS — H6993 Unspecified Eustachian tube disorder, bilateral: Secondary | ICD-10-CM

## 2016-12-16 NOTE — Progress Notes (Signed)
Subjective:    Patient ID: Debra Leach, female    DOB: 09-19-1950, 66 y.o.   MRN: [id number]  HPI    66 year old female with history of chronic daily migraine with aura and neuro changes followed by neurology presents with worsening head pressure and dizziness ongoing now in last 6-8 months. Intermittant.  Now worse in last 3 months.  She feels like she is under water. Decreased hearing bilaterally. Ongoing in last month.  Does not feel like a headache. No room spinning but she feels off balance. Lasts several hours. Bending over makes it worse but not really different with head movement. Meclizine helps it go away. Blood sugars are 104-112, BP nml when feeling dizzy. Not associated with standing.   She has noted ear pain initially.. None now. She does not have nasal congestion or PND.  No SOB, no wheeze.  Occ cough.  She has tried meclizine off and on. Helps but makes her very tired. Takes at bedtime  Tried allergy med ... Cyproheptadine (antihistamine) for migraine helped some with  Dizziness.     Recent endoscopy  2 weeks ago for difficult swallowing..  Dilation performed, saw swelling in larynx   She still feels sensation of knot in throat.  Plans to return to GI to re-eval with barium swallow per pt.    Review of Systems  Constitutional: Negative for fatigue and fever.  HENT: Positive for ear pain and sinus pressure. Negative for postnasal drip.   Eyes: Negative for pain.  Respiratory: Negative for chest tightness and shortness of breath.   Cardiovascular: Negative for chest pain, palpitations and leg swelling.  Gastrointestinal: Negative for abdominal pain.  Genitourinary: Negative for dysuria.       Objective:   Physical Exam  Constitutional: Vital signs are normal. She appears well-developed and well-nourished. She is cooperative.  Non-toxic appearance. She does not appear ill. No distress.  HENT:  Head: Normocephalic.  Right Ear: Hearing, external ear  and ear canal normal. Tympanic membrane is not erythematous, not retracted and not bulging. A middle ear effusion is present.  Left Ear: Hearing, external ear and ear canal normal. Tympanic membrane is not erythematous, not retracted and not bulging. A middle ear effusion is present.  Nose: No mucosal edema or rhinorrhea. Right sinus exhibits no maxillary sinus tenderness and no frontal sinus tenderness. Left sinus exhibits no maxillary sinus tenderness and no frontal sinus tenderness.  Mouth/Throat: Uvula is midline, oropharynx is clear and moist and mucous membranes are normal.  Eyes: Pupils are equal, round, and reactive to light. Conjunctivae, EOM and lids are normal. Lids are everted and swept, no foreign bodies found.  Neck: Trachea normal and normal range of motion. Neck supple. Carotid bruit is not present. No thyroid mass and no thyromegaly present.  Cardiovascular: Normal rate, regular rhythm, S1 normal, S2 normal, normal heart sounds, intact distal pulses and normal pulses.  Exam reveals no gallop and no friction rub.   No murmur heard. Pulmonary/Chest: Effort normal and breath sounds normal. No tachypnea. No respiratory distress. She has no decreased breath sounds. She has no wheezes. She has no rhonchi. She has no rales.  Abdominal: Soft. Normal appearance and bowel sounds are normal. There is no tenderness.  Neurological: She is alert.  Skin: Skin is warm, dry and intact. No rash noted.  Psychiatric: Her speech is normal and behavior is normal. Judgment and thought content normal. Her mood appears not anxious. Cognition and memory are normal. She does  not exhibit a depressed mood.          Assessment & Plan:

## 2016-12-16 NOTE — Patient Instructions (Signed)
Start flonase OTC 2 sprays per nostril daily x 2-3 weeks..  If not improving call  For ENT referral or prednisone taper

## 2016-12-16 NOTE — Assessment & Plan Note (Signed)
Possible causing vertigo, hearing loss and ear pressure.  Treat with   Nasal steroid x 2-3 week.. If not improvement consider pred taper or referral to ENT for eval of other causes.. Less likely meniere's dz.

## 2016-12-16 NOTE — Assessment & Plan Note (Addendum)
No typcila of BPPV but more consistent with inner ear issue than presyncope.  Nml BP and HR at time of dizziness and feeling of being off balance.

## 2016-12-21 ENCOUNTER — Other Ambulatory Visit: Payer: Self-pay | Admitting: Family Medicine

## 2016-12-21 DIAGNOSIS — Z1231 Encounter for screening mammogram for malignant neoplasm of breast: Secondary | ICD-10-CM

## 2016-12-22 ENCOUNTER — Encounter: Payer: Self-pay | Admitting: Neurology

## 2016-12-24 ENCOUNTER — Other Ambulatory Visit: Payer: Self-pay | Admitting: Neurology

## 2016-12-24 NOTE — Telephone Encounter (Signed)
I can't see from notes where you have her on medication.   Dr. Tomi Likens please advise on refill.

## 2016-12-24 NOTE — Telephone Encounter (Signed)
Looks like Dr. Tomi Likens tapered it off in April, hold off on refill unless patient calls. Thanks

## 2016-12-27 ENCOUNTER — Encounter: Payer: Self-pay | Admitting: Family Medicine

## 2016-12-28 MED ORDER — PREDNISONE 20 MG PO TABS: ORAL_TABLET | ORAL | 0 refills | Status: DC

## 2017-01-03 ENCOUNTER — Encounter: Payer: Self-pay | Admitting: Family Medicine

## 2017-01-03 DIAGNOSIS — H6983 Other specified disorders of Eustachian tube, bilateral: Secondary | ICD-10-CM

## 2017-01-04 ENCOUNTER — Encounter: Payer: Self-pay | Admitting: Neurology

## 2017-01-05 ENCOUNTER — Other Ambulatory Visit: Payer: Self-pay | Admitting: Neurology

## 2017-01-05 MED ORDER — SUMATRIPTAN SUCCINATE 6 MG/0.5ML ~~LOC~~ SOLN: 6.0000 mg | SUBCUTANEOUS | 5 refills | Status: DC | PRN

## 2017-01-05 MED ORDER — SUMATRIPTAN SUCCINATE 100 MG PO TABS: ORAL_TABLET | ORAL | 5 refills | Status: DC

## 2017-01-05 MED ORDER — ATENOLOL 100 MG PO TABS: 100.0000 mg | ORAL_TABLET | Freq: Every day | ORAL | 5 refills | Status: DC

## 2017-01-05 MED ORDER — ONDANSETRON HCL 4 MG PO TABS: ORAL_TABLET | ORAL | 5 refills | Status: DC

## 2017-01-05 MED ORDER — GABAPENTIN 300 MG PO CAPS: ORAL_CAPSULE | ORAL | 5 refills | Status: DC

## 2017-01-06 ENCOUNTER — Encounter: Payer: Self-pay | Admitting: Neurology

## 2017-01-07 ENCOUNTER — Encounter: Payer: Self-pay | Admitting: Neurology

## 2017-01-10 ENCOUNTER — Other Ambulatory Visit: Payer: Self-pay | Admitting: Unknown Physician Specialty

## 2017-01-10 DIAGNOSIS — G43909 Migraine, unspecified, not intractable, without status migrainosus: Secondary | ICD-10-CM

## 2017-01-10 DIAGNOSIS — R1312 Dysphagia, oropharyngeal phase: Secondary | ICD-10-CM

## 2017-01-20 ENCOUNTER — Ambulatory Visit: Payer: Medicare Other | Admitting: Neurology

## 2017-01-20 ENCOUNTER — Ambulatory Visit
Admission: RE | Admit: 2017-01-20 | Discharge: 2017-01-20 | Disposition: A | Discharge status: Home / Self Care | Payer: Medicare Other | Source: Ambulatory Visit | Attending: Unknown Physician Specialty | Admitting: Unknown Physician Specialty

## 2017-01-20 DIAGNOSIS — R1310 Dysphagia, unspecified: Secondary | ICD-10-CM | POA: Diagnosis present

## 2017-01-20 DIAGNOSIS — R1312 Dysphagia, oropharyngeal phase: Secondary | ICD-10-CM

## 2017-01-20 NOTE — Therapy (Signed)
[place], [place], [address] Phone: [phone]   Fax:     Modified Barium Swallow  Patient Details  Name: Debra Leach MRN: [id number] Date of Birth: [birth year] No Data Recorded  Encounter Date: 01/20/2017      End of Session - 01/20/17 1322    Visit Number 1   Number of Visits 1   Date for SLP Re-Evaluation 01/20/17   SLP Start Time 80   SLP Stop Time  1322   SLP Time Calculation (min) 52 min   Activity Tolerance Patient tolerated treatment well      Past Medical History:  Diagnosis Date  . Arthritis    osteoarthritis B knees, left hip , right elbow  . GERD (gastroesophageal reflux disease)   . Hyperlipidemia   . MVP (mitral valve prolapse)    history of    Past Surgical History:  Procedure Laterality Date  . arm surgery    . BREAST SURGERY  2000   breast biopsy (benign)  . LITHOTRIPSY  08-12-2008   stent placed bilaterally  . PARTIAL HYSTERECTOMY     Both ovaries remain, vaginal, for mennorhagia  . TONSILLECTOMY    . TUBAL LIGATION  1980    There were no vitals filed for this visit.   Subjective: Patient behavior: (alertness, ability to follow instructions, etc.): Patient is alert, able to verbalize her swallowing history, and follow directions.  Chief complaint: difficulty with pills and food; S/P EGD 11/30/2016 (Dr. Earlean Shawl) with findings including: no pathology, dilation, diagnosed moderate presbyesophagus   Objective:  Radiological Procedure: A videoflouroscopic evaluation of oral-preparatory, reflex initiation, and pharyngeal phases of the swallow was performed; as well as a screening of the upper esophageal phase.  I. POSTURE: Upright in MBS  II. VIEW: Lateral  III. COMPENSATORY STRATEGIES: N/A  IV. BOLUSES ADMINISTERED:   Thin Liquid: 2 small cup rim, 3 rapid consecutive sips   Nectar-thick Liquid: 1 moderate sip   Honey-thick Liquid: DNT   Puree: 2  teaspoon presentations   Mechanical Soft: 1/4 graham cracker in applesauce   Barium tablet  V. RESULTS OF EVALUATION: A. ORAL PREPARATORY PHASE: (The lips, tongue, and velum are observed for strength and coordination)       **Overall Severity Rating: Within normal limits  B. SWALLOW INITIATION/REFLEX: (The reflex is normal if "triggered" by the time the bolus reached the base of the tongue)  **Overall Severity Rating: Within normal limits  C. PHARYNGEAL PHASE: (Pharyngeal function is normal if the bolus shows rapid, smooth, and continuous transit through the pharynx and there is no pharyngeal residue after the swallow)  **Overall Severity Rating: Within normal limits  D. LARYNGEAL PENETRATION: (Material entering into the laryngeal inlet/vestibule but not aspirated) X1 with cough  E. ASPIRATION: None  F. ESOPHAGEAL PHASE: (Screening of the upper esophagus): In the cervical esophagus there is a finger-like protrusion along the posterior wall during swallow (does not impede flow of boluses) consistent with prominent cricopharyngeus.  There was stasis of the barium tablet in the mid-esophagus, which the patient perceived as at the top of the sternal notch.    ASSESSMENT: 66 year old woman, with difficulty swallowing pills and foods, is presenting with normal oropharyngeal swallowing.  Oral control of the bolus including oral hold, rotary mastication, and anterior to posterior transfer are within normal limits. Timing of the pharyngeal swallow is within normal limits.  Aspects of the pharyngeal stage of swallowing including tongue base retraction,  hyolaryngeal excursion, epiglottic inversion, and duration/amplitude of UES opening are within functional limits.  There is no pharyngeal residue.  In the cervical esophagus there is a finger-like protrusion along the posterior wall during swallow (does not impede flow of boluses) consistent with prominent cricopharyngeus.  There was one episode of  laryngeal penetration (with cough) and no tracheal aspiration.  The patient does not appear to be at risk for prandial aspiration.  A barium tablet passed through the oropharynx with slight hesitation at the UES.  There was stasis of the tablet in the mid-esophagus, which the patient perceived as at the top of the sternal notch.  In view of Dr. Liliane Channel findings, the patient was provided with basic suggestions for managing esophageal dysmotility.  The patient reports that she is not taking some medication due to difficulty with pills; she was strongly urged to consult with the prescribing MD and her pharmacist.  PLAN/RECOMMENDATIONS:   A. Diet: Regular diet- soften as need for comfort   B. Swallowing Precautions: Patient given tips for managing esophageal dysmotility   C. Recommended consultation to: GI   D. Therapy recommendations: not indicated   E. Results and recommendations were discussed with the patient immediately following the study and the final report routed to the referring MD and Dr. Earlean Shawl (per patient request).   Oropharyngeal dysphagia - Plan: DG OP Swallowing Func-Medicare/Speech Path, DG OP Swallowing Func-Medicare/Speech Path      G-Codes - February 17, 2017 1322    Functional Assessment Tool Used MBSS, clinical judgment   Functional Limitations Swallowing   Swallow Current Status (J5009) 0 percent impaired, limited or restricted   Swallow Goal Status (F8182) 0 percent impaired, limited or restricted   Swallow Discharge Status (X9371) 0 percent impaired, limited or restricted          Problem List Patient Active Problem List   Diagnosis Date Noted  . Eustachian tube disorder, bilateral 12/16/2016  . Vertigo 12/16/2016  . Esophageal dysphagia 11/22/2016  . Tremor 10/07/2016  . Chronic low back pain 10/07/2016  . Vision changes 08/12/2016  . Aortic atherosclerosis ([place]) 06/11/2016  . Supraclavicular fossa fullness 06/08/2016  . Family history of aortic aneurysm  08/20/2014  . Vitamin D deficiency 08/20/2014  . Diabetes mellitus with no complication ([place]) 69/67/8938  . OSTEOPENIA 11/06/2009  . Hyperlipidemia 07/05/2008  . INSOMNIA, CHRONIC 07/05/2008  . COMMON MIGRAINE 07/05/2008  . GERD 07/05/2008  . OSTEOARTHRITIS 07/05/2008  . MITRAL VALVE PROLAPSE, HX OF 07/05/2008  . NEPHROLITHIASIS, HX OF 07/05/2008    Leroy Sea, MS/CCC- SLP  Lou Miner February 17, 2017, 1:23 PM  [place], [place], [address] Phone: [phone]   Fax:     Name: Debra Leach MRN: [id number] Date of Birth: [birth year]

## 2017-01-21 NOTE — Progress Notes (Signed)
Notify pt.. Barium swallow report reviewed..  Recommend appt with GI if not already made.

## 2017-01-24 ENCOUNTER — Telehealth: Payer: Self-pay | Admitting: *Deleted

## 2017-01-24 NOTE — Telephone Encounter (Signed)
-----   Message from [name], MD sent at 01/21/2017 10:03 AM EDT -----   ----- Message ----- From: Wyline Beady Sent: 01/20/2017   1:34 PM To: [name], MD  MBS report for Tahiry Spicer, DOB [birth year]

## 2017-01-24 NOTE — Telephone Encounter (Signed)
[name] notified by telephone that Dr. Diona Browner has reviewed her  Barium swallow report and recommends appt with GI if not already made.  Per. Debra Leach,  she has called her GI today and is waiting for a call back to schedule appointment, so it is in the work.

## 2017-01-26 ENCOUNTER — Ambulatory Visit
Admission: RE | Admit: 2017-01-26 | Discharge: 2017-01-26 | Disposition: A | Discharge status: Home / Self Care | Payer: Medicare Other | Source: Ambulatory Visit | Attending: Unknown Physician Specialty | Admitting: Unknown Physician Specialty

## 2017-01-26 DIAGNOSIS — G43909 Migraine, unspecified, not intractable, without status migrainosus: Secondary | ICD-10-CM | POA: Diagnosis present

## 2017-01-27 ENCOUNTER — Ambulatory Visit
Admission: RE | Admit: 2017-01-27 | Discharge: 2017-01-27 | Disposition: A | Discharge status: Home / Self Care | Payer: Medicare Other | Source: Ambulatory Visit | Attending: Family Medicine | Admitting: Family Medicine

## 2017-01-27 DIAGNOSIS — Z1231 Encounter for screening mammogram for malignant neoplasm of breast: Secondary | ICD-10-CM

## 2017-02-05 ENCOUNTER — Other Ambulatory Visit: Payer: Self-pay | Admitting: Family Medicine

## 2017-02-05 NOTE — Telephone Encounter (Signed)
Last office visit 12/16/16.  Last refilled 02/24/16 for 30 g with 1 refill. Ok to refill?

## 2017-02-23 ENCOUNTER — Telehealth: Payer: Self-pay | Admitting: Family Medicine

## 2017-02-23 DIAGNOSIS — E559 Vitamin D deficiency, unspecified: Secondary | ICD-10-CM

## 2017-02-23 DIAGNOSIS — E782 Mixed hyperlipidemia: Secondary | ICD-10-CM

## 2017-02-23 DIAGNOSIS — E119 Type 2 diabetes mellitus without complications: Secondary | ICD-10-CM

## 2017-02-23 NOTE — Telephone Encounter (Signed)
-----   Message from Ellamae Sia sent at 02/16/2017  2:33 PM EDT ----- Regarding: Lab orders for Thursday, 9.20.18 Patient is scheduled for CPX labs, please order future labs, Thanks , Terri   Pt wants A1C

## 2017-02-24 ENCOUNTER — Other Ambulatory Visit (INDEPENDENT_AMBULATORY_CARE_PROVIDER_SITE_OTHER): Payer: Medicare Other

## 2017-02-24 DIAGNOSIS — E119 Type 2 diabetes mellitus without complications: Secondary | ICD-10-CM | POA: Diagnosis not present

## 2017-02-24 DIAGNOSIS — E559 Vitamin D deficiency, unspecified: Secondary | ICD-10-CM | POA: Diagnosis not present

## 2017-02-24 DIAGNOSIS — E782 Mixed hyperlipidemia: Secondary | ICD-10-CM | POA: Diagnosis not present

## 2017-02-24 LAB — COMPREHENSIVE METABOLIC PANEL
ALT: 18 U/L (ref 0–35)
AST: 16 U/L (ref 0–37)
Albumin: 3.6 g/dL (ref 3.5–5.2)
Alkaline Phosphatase: 64 U/L (ref 39–117)
BUN: 12 mg/dL (ref 6–23)
CO2: 33 mEq/L — ABNORMAL HIGH (ref 19–32)
Calcium: 9.2 mg/dL (ref 8.4–10.5)
Chloride: 104 mEq/L (ref 96–112)
Creatinine, Ser: 0.79 mg/dL (ref 0.40–1.20)
GFR: 77.44 mL/min (ref >60.00)
Glucose, Bld: 111 mg/dL — ABNORMAL HIGH (ref 70–99)
Potassium: 4.2 mEq/L (ref 3.5–5.1)
Sodium: 142 mEq/L (ref 135–145)
Total Bilirubin: 0.5 mg/dL (ref 0.2–1.2)
Total Protein: 6.7 g/dL (ref 6.0–8.3)

## 2017-02-24 LAB — LIPID PANEL
Cholesterol: 247 mg/dL — ABNORMAL HIGH (ref 0–200)
HDL: 64.9 mg/dL (ref >39.00)
LDL Cholesterol: 163 mg/dL — ABNORMAL HIGH (ref 0–99)
NonHDL: 182.58
Total CHOL/HDL Ratio: 4
Triglycerides: 99 mg/dL (ref 0.0–149.0)
VLDL: 19.8 mg/dL (ref 0.0–40.0)

## 2017-02-24 LAB — VITAMIN D 25 HYDROXY (VIT D DEFICIENCY, FRACTURES): VITD: 27.78 ng/mL — ABNORMAL LOW (ref 30.00–100.00)

## 2017-02-24 LAB — HEMOGLOBIN A1C: Hgb A1c MFr Bld: 7.4 % — ABNORMAL HIGH (ref 4.6–6.5)

## 2017-03-01 ENCOUNTER — Encounter: Payer: Self-pay | Admitting: Family Medicine

## 2017-03-01 ENCOUNTER — Ambulatory Visit (INDEPENDENT_AMBULATORY_CARE_PROVIDER_SITE_OTHER): Payer: Medicare Other | Admitting: Family Medicine

## 2017-03-01 VITALS — BP 120/80 | HR 85 | Temp 98.3°F | Ht 60.0 in | Wt 146.0 lb

## 2017-03-01 DIAGNOSIS — R21 Rash and other nonspecific skin eruption: Secondary | ICD-10-CM | POA: Insufficient documentation

## 2017-03-01 DIAGNOSIS — E782 Mixed hyperlipidemia: Secondary | ICD-10-CM

## 2017-03-01 DIAGNOSIS — G43111 Migraine with aura, intractable, with status migrainosus: Secondary | ICD-10-CM | POA: Diagnosis not present

## 2017-03-01 DIAGNOSIS — G43709 Chronic migraine without aura, not intractable, without status migrainosus: Secondary | ICD-10-CM

## 2017-03-01 DIAGNOSIS — E559 Vitamin D deficiency, unspecified: Secondary | ICD-10-CM | POA: Diagnosis not present

## 2017-03-01 DIAGNOSIS — E119 Type 2 diabetes mellitus without complications: Secondary | ICD-10-CM

## 2017-03-01 DIAGNOSIS — R1310 Dysphagia, unspecified: Secondary | ICD-10-CM | POA: Diagnosis not present

## 2017-03-01 DIAGNOSIS — Z0000 Encounter for general adult medical examination without abnormal findings: Secondary | ICD-10-CM | POA: Diagnosis not present

## 2017-03-01 DIAGNOSIS — R1319 Other dysphagia: Secondary | ICD-10-CM

## 2017-03-01 DIAGNOSIS — IMO0002 Reserved for concepts with insufficient information to code with codable children: Secondary | ICD-10-CM

## 2017-03-01 LAB — HM DIABETES FOOT EXAM

## 2017-03-01 MED ORDER — COLESEVELAM HCL 3.75 G PO PACK: PACK | ORAL | 11 refills | Status: DC

## 2017-03-01 MED ORDER — NYSTATIN 100000 UNIT/GM EX CREA: 1.0000 "application " | TOPICAL_CREAM | Freq: Two times a day (BID) | CUTANEOUS | 2 refills | Status: DC

## 2017-03-01 MED ORDER — CLOTRIMAZOLE-BETAMETHASONE 1-0.05 % EX CREA: TOPICAL_CREAM | CUTANEOUS | 0 refills | Status: DC

## 2017-03-01 MED ORDER — INDAPAMIDE 1.25 MG PO TABS: 1.2500 mg | ORAL_TABLET | Freq: Every morning | ORAL | 3 refills | Status: DC

## 2017-03-01 MED ORDER — VITAMIN D (ERGOCALCIFEROL) 1.25 MG (50000 UNIT) PO CAPS: 50000.0000 [IU] | ORAL_CAPSULE | ORAL | 0 refills | Status: DC

## 2017-03-01 MED ORDER — ATORVASTATIN CALCIUM 80 MG PO TABS: 80.0000 mg | ORAL_TABLET | Freq: Every day | ORAL | 3 refills | Status: DC

## 2017-03-01 NOTE — Patient Instructions (Addendum)
Return for flu vaccine once rash resolved.  Try a trial of topical  Nystatin cream .  Call if rash not improving as expected.

## 2017-03-01 NOTE — Assessment & Plan Note (Signed)
Now on new preventative.

## 2017-03-01 NOTE — Assessment & Plan Note (Signed)
-  replete °

## 2017-03-01 NOTE — Assessment & Plan Note (Signed)
Poor control but pt has been off meds.. Now restarted.

## 2017-03-01 NOTE — Assessment & Plan Note (Signed)
Candida vs hives.. Trial of nystatin as no clear hive trigger. If not improving consider oral steorid

## 2017-03-01 NOTE — Assessment & Plan Note (Signed)
S/P ENT and  GI work up.  S/p stricture. Will change meds to forms swallowed better.

## 2017-03-01 NOTE — Progress Notes (Signed)
Subjective:    Patient ID: Debra Leach, female    DOB: 1950-08-16, 66 y.o.   MRN: [id number]  HPI    66 year old female pt presents for welcome to medicare wellness visit.  I have personally reviewed the Medicare Annual Wellness questionnaire and have noted 1. The patient's medical and social history 2. Their use of alcohol, tobacco or illicit drugs 3. Their current medications and supplements 4. The patient's functional ability including ADL's, fall risks, home safety risks and hearing or visual             impairment. 5. Diet and physical activities 6. Evidence for depression or mood disorders 7.         Updated provider list Cognitive evaluation was performed and recorded on pt medicare questionnaire form. The patients weight, height, BMI and visual acuity have been recorded in the chart  I have made referrals, counseling and provided education to the patient based review of the above and I have provided the pt with a written personalized care plan for preventive services.   Documentation of this information was scanned into the electronic record under the media tab.  She has had some recurrent hives 2 times in last month under breast, on waist line and sides. Very itchy.  Has history of this.. Not clear trigger. No associated with med change.   Migraine severe , dizziness and tremors: Followed by neurology, Dr. Tomi Likens. Improved dizziness and tremor off atenolol. She has now started once a month preventative (Aimovig).. Has taken once  Using gabapentin at night .. Only one now.Diabetes:   Slight worsening control.  DM Worse given having to eat applesauce and grits, eggs. Lab Results  Component Value Date   HGBA1C 7.4 (H) 02/24/2017  Using medications without difficulties: Hypoglycemic episodes: none Hyperglycemic episodes: none Feet problems: Blood Sugars averaging: eye exam within last year: 08/2016  Dysphagia: Now in eval by GI. Barium swallow showed issues..  11/2016 endoscopy and dilation of stricture. Helped only a little.Marland Kitchen Recent manometry was normal.  Neg ENT work up.  Elevated Cholesterol:  Inadequate control  ( had to stop high dose statin and welchol.given some choking she was having). Has been able [place] back on in last month but able to break it apart.  Has tried other statins in past.  Having to eat eggs. Lab Results  Component Value Date   CHOL 247 (H) 02/24/2017   HDL 64.90 02/24/2017   LDLCALC 163 (H) 02/24/2017   LDLDIRECT 160.8 01/29/2013   TRIG 99.0 02/24/2017   CHOLHDL 4 02/24/2017  Using medications without problems: Muscle aches:  Diet compliance: poor Exercise:none Other complaints:    Hearing Screening   Method: Audiometry   125Hz  250Hz  500Hz  1000Hz  2000Hz  3000Hz  4000Hz  6000Hz  8000Hz   Right ear:   25 0 20  20    Left ear:   40 0 20  20    Vision Screening Comments: Wears Glasses-Eye Exam at [facility] March &amp; June 2018  Advance directives and end of life planning reviewed in detail with patient and documented in EMR. Patient given handout on advance care directives if needed. HCPOA and living will updated if needed. Fall Risk  03/01/2017 05/18/2016 12/29/2015 08/27/2015  Falls in the past year? Yes Yes No Yes  Number falls in past yr: 1 - - 1  Injury with Fall? No - - No  Follow up - - - Falls evaluation completed    Social History /Family History/Past Medical History  reviewed in detail and updated in EMR if needed. Blood pressure 120/80, pulse 85, temperature 98.3 F (36.8 C), temperature source Oral, height 5' (1.524 m), weight 146 lb (66.2 kg).  Review of Systems  Constitutional: Negative for fatigue and fever.  HENT: Negative for congestion.   Eyes: Negative for pain.  Respiratory: Negative for cough and shortness of breath.   Cardiovascular: Negative for chest pain, palpitations and leg swelling.  Gastrointestinal: Negative for abdominal pain.  Genitourinary: Negative for dysuria and vaginal  bleeding.  Musculoskeletal: Negative for back pain.  Neurological: Positive for dizziness and headaches. Negative for syncope and light-headedness.  Psychiatric/Behavioral: Negative for dysphoric mood.       Objective:   Physical Exam  Constitutional: Vital signs are normal. She appears well-developed and well-nourished. She is cooperative.  Non-toxic appearance. She does not appear ill. No distress.  HENT:  Head: Normocephalic.  Right Ear: Hearing, tympanic membrane, external ear and ear canal normal.  Left Ear: Hearing, tympanic membrane, external ear and ear canal normal.  Nose: Nose normal.  Eyes: Pupils are equal, round, and reactive to light. Conjunctivae, EOM and lids are normal. Lids are everted and swept, no foreign bodies found.  Neck: Trachea normal and normal range of motion. Neck supple. Carotid bruit is not present. No thyroid mass and no thyromegaly present.  Cardiovascular: Normal rate, regular rhythm, S1 normal, S2 normal, normal heart sounds and intact distal pulses.  Exam reveals no gallop.   No murmur heard. Pulmonary/Chest: Effort normal and breath sounds normal. No respiratory distress. She has no wheezes. She has no rhonchi. She has no rales.  Abdominal: Soft. Normal appearance and bowel sounds are normal. She exhibits no distension, no fluid wave, no abdominal bruit and no mass. There is no hepatosplenomegaly. There is no tenderness. There is no rebound, no guarding and no CVA tenderness. No hernia.  Lymphadenopathy:    She has no cervical adenopathy.    She has no axillary adenopathy.  Neurological: She is alert. She has normal strength. No cranial nerve deficit or sensory deficit.  Skin: Skin is warm, dry and intact. No rash noted.  Erythema in skin folds of breast and groin and under arms  Psychiatric: Her speech is normal and behavior is normal. Judgment normal. Her mood appears not anxious. Cognition and memory are normal. She does not exhibit a depressed mood.       Diabetic foot exam: Normal inspection No skin breakdown No calluses  Normal DP pulses Normal sensation to light touch and monofilament Nails normal     Assessment & Plan:  The patient's preventative maintenance and recommended screening tests for an annual wellness exam were reviewed in full today. Brought up to date unless services declined.  Counselled on the importance of diet, exercise, and its role in overall health and mortality. The patient's FH and SH was reviewed, including their home life, tobacco status, and drug and alcohol status.   Colonoscopy was normal in 2013, repeat in 10 years.  Nml endoscopy for GI bleed in 12/2014. PAP partial hysterectomy 2001, cervix remains, last pap 2015, no further indicated. No family history of ovarian cancer. Asymptomatic. Flu due, Tdap due, Shingles PNA vaccine. Last DEXA Stable osteopenia, nml vit D. 02/2013 repeat in 5 years Mammo: nml 01/2017

## 2017-03-04 MED ORDER — NYSTATIN 100000 UNIT/GM EX POWD: Freq: Four times a day (QID) | CUTANEOUS | 0 refills | Status: DC

## 2017-03-13 ENCOUNTER — Encounter: Payer: Self-pay | Admitting: Family Medicine

## 2017-03-15 NOTE — Telephone Encounter (Signed)
Pt called in follow up to the mychart message from this am.  She said that she can take oral steroids and is still in [facility] and would like the RX sent there.  She did not leave a pharmacy name or info.

## 2017-03-16 MED ORDER — PREDNISONE 20 MG PO TABS: ORAL_TABLET | ORAL | 0 refills | Status: DC

## 2017-03-28 ENCOUNTER — Encounter: Payer: Self-pay | Admitting: Neurology

## 2017-04-04 ENCOUNTER — Telehealth: Payer: Self-pay

## 2017-04-04 NOTE — Telephone Encounter (Signed)
Rcvd a call from [place] at [facility] appeals department. The Aimovig has been approved PA# [id number]. Sent Pt an email, called [employer] in [place], spoke to Amy, she ran the claim while on the phone with me. It went through.

## 2017-04-04 NOTE — Telephone Encounter (Signed)
Called [employer],[phone] spoke with Debra Leach, she started an expedited appeal for Aimovig for which we rcvd a denail that was denied on 03/28/17. We will rcv a response within 72 hrs.ref PA #[id number]

## 2017-04-12 ENCOUNTER — Other Ambulatory Visit: Payer: Self-pay | Admitting: Neurological Surgery

## 2017-04-12 DIAGNOSIS — M4317 Spondylolisthesis, lumbosacral region: Secondary | ICD-10-CM

## 2017-04-23 ENCOUNTER — Other Ambulatory Visit: Payer: Self-pay

## 2017-04-23 ENCOUNTER — Ambulatory Visit
Admission: RE | Admit: 2017-04-23 | Discharge: 2017-04-23 | Disposition: A | Discharge status: Home / Self Care | Payer: Medicare Other | Source: Ambulatory Visit | Attending: Neurological Surgery | Admitting: Neurological Surgery

## 2017-04-23 DIAGNOSIS — M4317 Spondylolisthesis, lumbosacral region: Secondary | ICD-10-CM

## 2017-05-05 ENCOUNTER — Ambulatory Visit (INDEPENDENT_AMBULATORY_CARE_PROVIDER_SITE_OTHER): Payer: Medicare Other | Admitting: Psychology

## 2017-05-05 DIAGNOSIS — F4323 Adjustment disorder with mixed anxiety and depressed mood: Secondary | ICD-10-CM

## 2017-05-05 DIAGNOSIS — F4310 Post-traumatic stress disorder, unspecified: Secondary | ICD-10-CM

## 2017-05-09 ENCOUNTER — Ambulatory Visit (INDEPENDENT_AMBULATORY_CARE_PROVIDER_SITE_OTHER): Payer: Medicare Other | Admitting: Neurology

## 2017-05-09 ENCOUNTER — Encounter: Payer: Self-pay | Admitting: Neurology

## 2017-05-09 VITALS — BP 140/78 | HR 88 | Ht 60.5 in | Wt 145.6 lb

## 2017-05-09 DIAGNOSIS — G43709 Chronic migraine without aura, not intractable, without status migrainosus: Secondary | ICD-10-CM | POA: Diagnosis not present

## 2017-05-09 MED ORDER — GABAPENTIN 300 MG PO CAPS: ORAL_CAPSULE | ORAL | 5 refills | Status: DC

## 2017-05-09 MED ORDER — SUMATRIPTAN SUCCINATE 6 MG/0.5ML ~~LOC~~ SOLN: 6.0000 mg | SUBCUTANEOUS | 5 refills | Status: DC | PRN

## 2017-05-09 MED ORDER — ONDANSETRON HCL 4 MG PO TABS: 4.0000 mg | ORAL_TABLET | Freq: Three times a day (TID) | ORAL | 5 refills | Status: DC | PRN

## 2017-05-09 NOTE — Patient Instructions (Signed)
1.  Continue Aimovig 2.  Continue gabapentin, sumatriptan shot and Zofran as needed. 3.  Follow up in 3 months.

## 2017-05-09 NOTE — Progress Notes (Signed)
NEUROLOGY FOLLOW UP OFFICE NOTE  ANASIA AGRO [id number]  HISTORY OF PRESENT ILLNESS: Sophiya Morello is a 66 year old right-handed female with chronic migraines, hyperlipidemia, diabetes, GERD, osteoarthritis, and history of MVP and nephrolithiasis who follows up for migraines.   UPDATE:  She started Aimovig 2.5 months ago and has completed 3 rounds.  She has had significant improvement.  She has had 16 headache-free days over the past 2.5 months.  31 days over the past 2.5 months, she woke up with no headache at all.  13 days in the past 2.5 months were severe migraines. They last about 2 hours with sumatriptan 37m [place].  Quality of life has greatly improved.  She can go out with friends and run errands.  Current NSAIDS:  none Current analgesics:  none Current triptans:  sumatriptan 1037m sumatriptan 7m77mC Current anti-emetic:  Zofran 4mg80meclizine for dizziness. Current muscle relaxants:  none Current Antihypertensive medications:  Indapamide Current Antidepressant medications:  none Current Anticonvulsant medications:  gabapentin (usually takes as needed) Current CGRP inhibitor:  Aimovig Current antihistamine:  cyproheptadine 4mg 68mrent Vitamins/Herbal/Supplements:  magnesium 400mg,62moflavin, coenzyme Q10 Other therapy:  Cefaly   Caffeine:  1 cup coffee daily (no caffeine made no difference) Smoker:  no Diet:  Follows LEAP ImmunoCalm Dietary Program Exercise:  When tolerated (due to knee problem).  She walks.  She also bikes. Depression/stress:  stable Sleep hygiene:  improved.   HISTORY: Onset:  19 yea72 old Location:  Varies (unilateral either side, frontal-temporal, back of head) Quality:  Varies (stabbing, pounding, throbbing) Initial Intensity:  10/10 severe, otherwise 5-7/10 Aura:  Occurs off and on over the years and varies in semiology.  In early 1970s, black out.  In late 1980s, white out.  In late 1990s, flashing lights and zigzag lines.  Since 2016,  scintillating scotoma (occurs 1 to 2 times a month) Prodrome:  no Associated symptoms:  Initially vomiting.  Sometimes nausea.  Photophobia, phonophobia.  Dizziness. Initial Duration:  All day Initial Frequency:  daily Triggers/exacerbating factors:  Change in weather, stress, valsalva, odors, certain foods  Relieving factors:  Ice, rest Activity:  Difficult to function if severe   Past NSAIDS:  diclofenac 50mg, 38mia, Mobic 15mg, i67mofen, naproxen, toradol  Past analgesics:  tramadol (reaction), Midrin, Excedrin Past abortive triptans/ergots:  Treximet, Axert, Amerge, Frova, Maxalt, Relpax, Zomig tablet, DHE NS Past anxiolytic:  Buspirone, clonazepam Past antihypertensive medications:  Metoprolol, Norvasc, propranolol ER 120mg, at38mol 100mg (sid51mfects dizziness, myalgias, acid reflux) Past antidepressant medications:  Venlafaxine, sertraline, amitriptyline, amoxapine, duloxetine, nortriptyline, imipramine, Luvox, Remeron, Serzone, bupropion, desipramine, doxepin, Vivactil Past anticonvulsant medications:  Depakote, Topamax, zonisamide, Lamictal, Keppra, Lyrica, gabapentin 300/300/600  Past vitamins/Herbal/Supplements:  butterbur Other past treatments:  Botox (2 years), Cefaly Device, methylergonovine, acupuncture, biofeedback, Seroquel, Thorazine, methylsergide maleate     Family history of headache:  Maybe her mother's side.   Prior brain MRI normal (date unknown but report is mentioned in prior notes).  Abnormal Movements: In December 2017, we started atenolol for migraine prevention.  It worked well, but due to reported side effects of dizziness, acid reflux and joint and muscle pain, she was tapered off of it in early April.  Around that time, she developed shaking episodes or tremors associated with her migraines.  She followed up with her PCP, Dr. Bedsole, oDiona Browner8 and we had her discontinue the gabapentin.  She hasn't had any recurrent tremors, however she says she has gone  up to 4 days at a  time without an attack.  Since discontinuing the gabapentin, the intensity of her daily headaches have gotten worse.     I reviewed the video of her habitual attacks.  In the video, she exhibits flapping of her right hand, arrhythmic and without tremor or choreiform movement.  It is not an arrhythmic jerking, consistent with myoclonus.  It sometimes involves the left hand as well.  She denies stress and anxiety.  PAST MEDICAL HISTORY: Past Medical History:  Diagnosis Date  . Arthritis    osteoarthritis B knees, left hip , right elbow  . GERD (gastroesophageal reflux disease)   . Hyperlipidemia   . MVP (mitral valve prolapse)    history of    MEDICATIONS: Current Outpatient Medications on File Prior to Visit  Medication Sig Dispense Refill  . ALPRAZolam (XANAX) 0.25 MG tablet Take 1 tablet (0.25 mg total) by mouth 2 (two) times daily as needed for anxiety. (Patient not taking: Reported on 05/09/2017) 20 tablet 0  . atorvastatin (LIPITOR) 80 MG tablet Take 1 tablet (80 mg total) by mouth daily. 90 tablet 3  . Blood Glucose Monitoring Suppl ([place]) w/Device KIT 1 each by Does not apply route See admin instructions. Check blood sugar daily.  Dx: E11.9 1 kit 0  . cholecalciferol (VITAMIN D) 1000 units tablet Take 1,000 Units by mouth daily.    . clotrimazole-betamethasone (LOTRISONE) cream APPLY TO AFFECTED AREA(S) AS NEEDED 30 g 0  . Colesevelam HCl 3.75 g PACK 1 pack daily 30 each 11  . Erenumab-aooe (AIMOVIG) 70 MG/ML SOAJ Inject 70 mg into the skin every 30 (thirty) days.    . indapamide (LOZOL) 1.25 MG tablet Take 1 tablet (1.25 mg total) by mouth every morning. (Patient not taking: Reported on 05/09/2017) 90 tablet 3  . Magnesium 400 MG TABS Take 1 tablet by mouth daily.     . meclizine (ANTIVERT) 25 MG tablet Take 25-50 mg by mouth as needed for dizziness.    . nystatin (MYCOSTATIN/NYSTOP) powder Apply topically 4 (four) times daily. (Patient not  taking: Reported on 05/09/2017) 15 g 0  . omeprazole (PRILOSEC) 20 MG capsule Take 1 capsule (20 mg total) by mouth 2 (two) times daily before a meal. 60 capsule 3  . ONETOUCH DELICA LANCETS 40J MISC     . ONETOUCH VERIO test strip     . predniSONE (DELTASONE) 20 MG tablet 3 tabs by mouth daily x 3 days, then 2 tabs by mouth daily x 2 days then 1 tab by mouth daily x 2 days (Patient not taking: Reported on 05/09/2017) 15 tablet 0  . Riboflavin (VITAMIN B2 PO) Take 400 mg by mouth 3 (three) times daily.    . Syringe/Needle, Disp, (SYRINGE LUER LOCK) 25G X 5/8" 3 ML MISC Use as needed 50 each 2  . Vitamin D, Ergocalciferol, (DRISDOL) 50000 units CAPS capsule Take 1 capsule (50,000 Units total) by mouth every 7 (seven) days. 12 capsule 0   No current facility-administered medications on file prior to visit.     ALLERGIES: Allergies  Allergen Reactions  . Codeine     REACTION: Migraine  . Penicillins     REACTION: Itching  . Pentazocine Lactate     REACTION: Swelling, itching, rash  . Crestor [Rosuvastatin] Other (See Comments)    FAMILY HISTORY: Family History  Problem Relation Age of Onset  . Cancer Mother        bone  . Hypertension Father   . Mitral valve prolapse  Father   . Asthma Brother   . Arthritis Brother   . Nephrolithiasis Brother   . Nephrolithiasis Brother   . Asthma Brother   . Arthritis Brother   . Aortic aneurysm Brother        ascending aortic aneuysm  . Breast cancer Maternal Aunt   . Breast cancer Maternal Aunt     SOCIAL HISTORY: Social History   Socioeconomic History  . Marital status: Married    Spouse name: Not on file  . Number of children: 0  . Years of education: Not on file  . Highest education level: Not on file  Social Needs  . Financial resource strain: Not on file  . Food insecurity - worry: Not on file  . Food insecurity - inability: Not on file  . Transportation needs - medical: Not on file  . Transportation needs - non-medical: Not  on file  Occupational History  . Occupation: retired Energy manager: retired  Tobacco Use  . Smoking status: Former Research scientist (life sciences)  . Smokeless tobacco: Never Used  Substance and Sexual Activity  . Alcohol use: Yes    Comment: rare  . Drug use: No  . Sexual activity: Not on file  Other Topics Concern  . Not on file  Social History Narrative   Regular exercise--yes, recumbent bike 3-4 days a week      Diet: fruits and veggies, water, eats at home, drinks a lot of milk    REVIEW OF SYSTEMS: Constitutional: No fevers, chills, or sweats, no generalized fatigue, change in appetite Eyes: No visual changes, double vision, eye pain Ear, nose and throat: No hearing loss, ear pain, nasal congestion, sore throat Cardiovascular: No chest pain, palpitations Respiratory:  No shortness of breath at rest or with exertion, wheezes GastrointestinaI: No nausea, vomiting, diarrhea, abdominal pain, fecal incontinence Genitourinary:  No dysuria, urinary retention or frequency Musculoskeletal:  No neck pain, back pain Integumentary: No rash, pruritus, skin lesions Neurological: as above Psychiatric: No depression, insomnia, anxiety Endocrine: No palpitations, fatigue, diaphoresis, mood swings, change in appetite, change in weight, increased thirst Hematologic/Lymphatic:  No purpura, petechiae. Allergic/Immunologic: no itchy/runny eyes, nasal congestion, recent allergic reactions, rashes  PHYSICAL EXAM: Vitals:   05/09/17 1054  BP: 140/78  Pulse: 88  SpO2: 97%   General: No acute distress.  Patient appears well-groomed.   Head:  Normocephalic/atraumatic Eyes:  Fundi examined but not visualized Neck: supple, no paraspinal tenderness, full range of motion Heart:  Regular rate and rhythm Lungs:  Clear to auscultation bilaterally Back: No paraspinal tenderness Neurological Exam: alert and oriented to person, place, and time. Attention span and concentration intact, recent and remote memory  intact, fund of knowledge intact.  Speech fluent and not dysarthric, language intact.  CN II-XII intact. Bulk and tone normal, muscle strength 5/5 throughout.  Sensation to light touch  intact.  Deep tendon reflexes 2+ throughout.  Finger to nose testing intact.  Gait normal, Romberg negative.  IMPRESSION: Chronic migraine, significantly improved since starting Aimovig  PLAN: 1.  Continue Aimovig  2.  Sumatriptan 53m [place], Zofran, gabapentin as needed (refilled today) 3.  Follow up in 3 months.  AMetta Clines DO  CC:  AEliezer Lofts MD

## 2017-05-13 ENCOUNTER — Ambulatory Visit (INDEPENDENT_AMBULATORY_CARE_PROVIDER_SITE_OTHER): Payer: Medicare Other | Admitting: Psychology

## 2017-05-13 ENCOUNTER — Encounter: Payer: Self-pay | Admitting: Neurology

## 2017-05-13 DIAGNOSIS — F4323 Adjustment disorder with mixed anxiety and depressed mood: Secondary | ICD-10-CM | POA: Diagnosis not present

## 2017-05-13 DIAGNOSIS — F4310 Post-traumatic stress disorder, unspecified: Secondary | ICD-10-CM

## 2017-05-13 NOTE — Telephone Encounter (Signed)
Called in Rx for prednisone 10mg  #21 taper, spoke with [facility]

## 2017-05-18 ENCOUNTER — Encounter: Payer: Self-pay | Admitting: Family Medicine

## 2017-05-18 ENCOUNTER — Other Ambulatory Visit: Payer: Self-pay | Admitting: *Deleted

## 2017-05-18 MED ORDER — COLESEVELAM HCL 3.75 G PO PACK: PACK | ORAL | 3 refills | Status: DC

## 2017-05-18 NOTE — Telephone Encounter (Signed)
Noted  

## 2017-05-19 ENCOUNTER — Ambulatory Visit (INDEPENDENT_AMBULATORY_CARE_PROVIDER_SITE_OTHER): Payer: Medicare Other | Admitting: Psychology

## 2017-05-19 DIAGNOSIS — F4310 Post-traumatic stress disorder, unspecified: Secondary | ICD-10-CM

## 2017-05-19 DIAGNOSIS — F4323 Adjustment disorder with mixed anxiety and depressed mood: Secondary | ICD-10-CM

## 2017-05-19 NOTE — Telephone Encounter (Signed)
Please refill her medications for a year as requested on mychart.

## 2017-05-23 ENCOUNTER — Telehealth: Payer: Self-pay | Admitting: Family Medicine

## 2017-05-23 DIAGNOSIS — E119 Type 2 diabetes mellitus without complications: Secondary | ICD-10-CM

## 2017-05-23 NOTE — Telephone Encounter (Signed)
-----   Message from Ellamae Sia sent at 05/19/2017  3:00 PM EST ----- Regarding: Lab orders for Tuesday, 12.18.18 Lab orders for a 3 month follow up appt.

## 2017-05-24 ENCOUNTER — Other Ambulatory Visit (INDEPENDENT_AMBULATORY_CARE_PROVIDER_SITE_OTHER): Payer: Medicare Other

## 2017-05-24 DIAGNOSIS — E119 Type 2 diabetes mellitus without complications: Secondary | ICD-10-CM | POA: Diagnosis not present

## 2017-05-24 LAB — COMPREHENSIVE METABOLIC PANEL
ALT: 19 U/L (ref 0–35)
AST: 15 U/L (ref 0–37)
Albumin: 3.7 g/dL (ref 3.5–5.2)
Alkaline Phosphatase: 58 U/L (ref 39–117)
BUN: 11 mg/dL (ref 6–23)
CO2: 31 mEq/L (ref 19–32)
Calcium: 9.1 mg/dL (ref 8.4–10.5)
Chloride: 108 mEq/L (ref 96–112)
Creatinine, Ser: 0.74 mg/dL (ref 0.40–1.20)
GFR: 83.44 mL/min (ref >60.00)
Glucose, Bld: 103 mg/dL — ABNORMAL HIGH (ref 70–99)
Potassium: 4.3 mEq/L (ref 3.5–5.1)
Sodium: 146 mEq/L — ABNORMAL HIGH (ref 135–145)
Total Bilirubin: 0.5 mg/dL (ref 0.2–1.2)
Total Protein: 7.2 g/dL (ref 6.0–8.3)

## 2017-05-24 LAB — LIPID PANEL
Cholesterol: 212 mg/dL — ABNORMAL HIGH (ref 0–200)
HDL: 53.9 mg/dL (ref >39.00)
LDL Cholesterol: 128 mg/dL — ABNORMAL HIGH (ref 0–99)
NonHDL: 157.77
Total CHOL/HDL Ratio: 4
Triglycerides: 149 mg/dL (ref 0.0–149.0)
VLDL: 29.8 mg/dL (ref 0.0–40.0)

## 2017-05-24 LAB — HEMOGLOBIN A1C: Hgb A1c MFr Bld: 6.9 % — ABNORMAL HIGH (ref 4.6–6.5)

## 2017-05-24 MED ORDER — ONETOUCH DELICA LANCETS 33G MISC: 3 refills | Status: DC

## 2017-05-24 MED ORDER — OMEPRAZOLE 20 MG PO CPDR: 20.0000 mg | DELAYED_RELEASE_CAPSULE | Freq: Two times a day (BID) | ORAL | 3 refills | Status: DC

## 2017-05-24 MED ORDER — ONETOUCH VERIO VI STRP: ORAL_STRIP | 3 refills | Status: DC

## 2017-05-24 NOTE — Addendum Note (Signed)
Addended by: Carter Kitten on: 05/24/2017 07:47 AM   Modules accepted: Orders

## 2017-05-26 ENCOUNTER — Ambulatory Visit (INDEPENDENT_AMBULATORY_CARE_PROVIDER_SITE_OTHER): Payer: Medicare Other | Admitting: Psychology

## 2017-05-26 DIAGNOSIS — F4310 Post-traumatic stress disorder, unspecified: Secondary | ICD-10-CM | POA: Diagnosis not present

## 2017-05-27 ENCOUNTER — Other Ambulatory Visit: Payer: Self-pay

## 2017-05-27 ENCOUNTER — Ambulatory Visit (INDEPENDENT_AMBULATORY_CARE_PROVIDER_SITE_OTHER): Payer: Medicare Other | Admitting: Family Medicine

## 2017-05-27 ENCOUNTER — Encounter: Payer: Self-pay | Admitting: Family Medicine

## 2017-05-27 VITALS — BP 120/66 | HR 98 | Temp 98.5°F | Ht 60.0 in | Wt 145.8 lb

## 2017-05-27 DIAGNOSIS — G43111 Migraine with aura, intractable, with status migrainosus: Secondary | ICD-10-CM | POA: Diagnosis not present

## 2017-05-27 DIAGNOSIS — E782 Mixed hyperlipidemia: Secondary | ICD-10-CM

## 2017-05-27 DIAGNOSIS — E119 Type 2 diabetes mellitus without complications: Secondary | ICD-10-CM

## 2017-05-27 DIAGNOSIS — I700 Atherosclerosis of aorta: Secondary | ICD-10-CM

## 2017-05-27 LAB — HM DIABETES FOOT EXAM

## 2017-05-27 NOTE — Assessment & Plan Note (Signed)
Working on lowering cholesterol in diet.

## 2017-05-27 NOTE — Assessment & Plan Note (Signed)
Much better control on AIMOVIG.

## 2017-05-27 NOTE — Assessment & Plan Note (Signed)
Much improved with diet improvement now that migraines better.

## 2017-05-27 NOTE — Assessment & Plan Note (Signed)
Improved control back on statin.

## 2017-05-27 NOTE — Progress Notes (Signed)
Subjective:    Patient ID: Debra Leach, female    DOB: 1950-08-26, 66 y.o.   MRN: [id number]  HPI  66 year old female presents for follow up DM  Migraine control is much better on new medication injections ( AIMOVIG) followed by neurology. Last month was bad, but much better.  Diabetes:  Now at goal on no medication  Lab Results  Component Value Date   HGBA1C 6.9 (H) 05/24/2017  Using medications without difficulties: Hypoglycemic episodes: none Hyperglycemic episodes: none Feet problems: none Blood Sugars averaging: FBS  96-113 eye exam within last year: yes  Elevated Cholesterol:  Significant improvement in last 3 months back on cholesterol med.Marland Kitchen atorvastatin 80 Lab Results  Component Value Date   CHOL 212 (H) 05/24/2017   HDL 53.90 05/24/2017   LDLCALC 128 (H) 05/24/2017   LDLDIRECT 160.8 01/29/2013   TRIG 149.0 05/24/2017   CHOLHDL 4 05/24/2017  Using medications without problems: none Muscle aches:  None Diet compliance: much  betteri Exercise: walking limited Other complaints:  Blood pressure 120/66, pulse 98, temperature 98.5 F (36.9 C), temperature source Oral, height 5' (1.524 m), weight 145 lb 12 oz (66.1 kg).  Review of Systems  Constitutional: Negative for fatigue, fever and unexpected weight change.  HENT: Negative for congestion, ear pain, sinus pressure, sneezing, sore throat and trouble swallowing.   Eyes: Negative for pain and itching.  Respiratory: Negative for cough, shortness of breath and wheezing.   Cardiovascular: Negative for chest pain, palpitations and leg swelling.  Gastrointestinal: Negative for abdominal pain, blood in stool, constipation, diarrhea and nausea.  Genitourinary: Negative for difficulty urinating, dysuria, hematuria, menstrual problem and vaginal discharge.  Skin: Negative for rash.  Neurological: Negative for syncope, weakness, light-headedness, numbness and headaches.  Psychiatric/Behavioral: Negative for confusion  and dysphoric mood. The patient is not nervous/anxious.        Objective:   Physical Exam  Constitutional: Vital signs are normal. She appears well-developed and well-nourished. She is cooperative.  Non-toxic appearance. She does not appear ill. No distress.  HENT:  Head: Normocephalic.  Right Ear: Hearing, tympanic membrane, external ear and ear canal normal. Tympanic membrane is not erythematous, not retracted and not bulging.  Left Ear: Hearing, tympanic membrane, external ear and ear canal normal. Tympanic membrane is not erythematous, not retracted and not bulging.  Nose: No mucosal edema or rhinorrhea. Right sinus exhibits no maxillary sinus tenderness and no frontal sinus tenderness. Left sinus exhibits no maxillary sinus tenderness and no frontal sinus tenderness.  Mouth/Throat: Uvula is midline, oropharynx is clear and moist and mucous membranes are normal.  Eyes: Conjunctivae, EOM and lids are normal. Pupils are equal, round, and reactive to light. Lids are everted and swept, no foreign bodies found.  Neck: Trachea normal and normal range of motion. Neck supple. Carotid bruit is not present. No thyroid mass and no thyromegaly present.  Cardiovascular: Normal rate, regular rhythm, S1 normal, S2 normal, normal heart sounds, intact distal pulses and normal pulses. Exam reveals no gallop and no friction rub.  No murmur heard. Pulmonary/Chest: Effort normal and breath sounds normal. No tachypnea. No respiratory distress. She has no decreased breath sounds. She has no wheezes. She has no rhonchi. She has no rales.  Abdominal: Soft. Normal appearance and bowel sounds are normal. There is no tenderness.  Neurological: She is alert.  Skin: Skin is warm, dry and intact. No rash noted.  Psychiatric: Her speech is normal and behavior is normal. Judgment and thought content  normal. Her mood appears not anxious. Cognition and memory are normal. She does not exhibit a depressed mood.   Diabetic  foot exam: Normal inspection No skin breakdown No calluses  Normal DP pulses Normal sensation to light touch and monofilament Nails normal       Assessment & Plan:

## 2017-06-07 HISTORY — PX: PANCREAS BIOPSY: SHX1018

## 2017-06-10 ENCOUNTER — Encounter: Payer: Self-pay | Admitting: Neurology

## 2017-06-13 MED ORDER — ERENUMAB-AOOE 70 MG/ML ~~LOC~~ SOAJ: 140.0000 mg | SUBCUTANEOUS | 11 refills | Status: DC

## 2017-06-30 ENCOUNTER — Ambulatory Visit (INDEPENDENT_AMBULATORY_CARE_PROVIDER_SITE_OTHER): Payer: Medicare Other | Admitting: Psychology

## 2017-06-30 DIAGNOSIS — F4310 Post-traumatic stress disorder, unspecified: Secondary | ICD-10-CM | POA: Diagnosis not present

## 2017-06-30 DIAGNOSIS — F4323 Adjustment disorder with mixed anxiety and depressed mood: Secondary | ICD-10-CM

## 2017-07-05 ENCOUNTER — Ambulatory Visit: Payer: Medicare Other | Admitting: Psychology

## 2017-07-06 DIAGNOSIS — R634 Abnormal weight loss: Secondary | ICD-10-CM | POA: Insufficient documentation

## 2017-07-07 ENCOUNTER — Encounter: Payer: Self-pay | Admitting: *Deleted

## 2017-07-08 ENCOUNTER — Encounter: Payer: Self-pay | Admitting: *Deleted

## 2017-07-08 ENCOUNTER — Other Ambulatory Visit: Payer: Self-pay

## 2017-07-08 ENCOUNTER — Ambulatory Visit (INDEPENDENT_AMBULATORY_CARE_PROVIDER_SITE_OTHER): Payer: Medicare Other | Admitting: Family Medicine

## 2017-07-08 ENCOUNTER — Encounter: Payer: Self-pay | Admitting: Family Medicine

## 2017-07-08 VITALS — BP 130/70 | HR 101 | Temp 99.2°F | Ht 60.0 in | Wt 136.5 lb

## 2017-07-08 DIAGNOSIS — R634 Abnormal weight loss: Secondary | ICD-10-CM

## 2017-07-08 NOTE — Patient Instructions (Addendum)
Please stop at the lab to have labs drawn. Try to add protein to diet as much as able.

## 2017-07-08 NOTE — Progress Notes (Signed)
Subjective:    Patient ID: Debra Leach, female    DOB: April 01, 1951, 67 y.o.   MRN: [id number]  HPI  67 year old female presents with recent abnormal weight loss and  Inability to eat. She has severe nausea, no vomiting. Water brash, heaving. Appetite is quite variable, frequently with early satiety. Weight loss of about 15 lbs. Swallowing is improved compared to last summer. Still with intermittent dysphagia, no impaction. Taking omeprazole 20 mg BID. Poor energy.   She saw GI MD at [facility], Dr. Earlean Shawl on 1/30 for the same.  Evaluated with labs.    Has CT planned for next week to rule out pancreatic cancer. She has actually increase amovig to 140... Last dose was 06/24/2017 May also need EGD. Ca 19-9 nml  CMET nml except low albumin. Cbc  Wbc 11.3 hg 12.2 plt 385 Wt Readings from Last 3 Encounters:  07/08/17 136 lb 8 oz (61.9 kg)  05/27/17 145 lb 12 oz (66.1 kg)  05/09/17 145 lb 9.6 oz (66 kg)   She is still having trouble eating despite zofran. She drinks 48 oz a day water.  Not able to get in 400 cal a day.   Blood pressure 130/70, pulse (!) 101, temperature 99.2 F (37.3 C), temperature source Oral, height 5' (1.524 m), weight 136 lb 8 oz (61.9 kg).  Mother : osteosarcoma Review of Systems  Constitutional: Positive for appetite change, chills, fatigue and unexpected weight change. Negative for fever.  HENT: Negative for congestion.   Eyes: Negative for pain.  Respiratory: Negative for cough and shortness of breath.   Cardiovascular: Negative for chest pain, palpitations and leg swelling.  Gastrointestinal: Positive for nausea. Negative for abdominal pain.  Genitourinary: Negative for dysuria and vaginal bleeding.  Musculoskeletal: Negative for back pain.  Neurological: Positive for light-headedness. Negative for syncope and headaches.  Psychiatric/Behavioral: Negative for dysphoric mood.       Objective:   Physical Exam  Constitutional: Vital signs are  normal. She appears lethargic. She appears cachectic. She is cooperative.  Non-toxic appearance. She has a sickly appearance. She appears ill. No distress.  HENT:  Head: Normocephalic.  Right Ear: Hearing, tympanic membrane, external ear and ear canal normal. Tympanic membrane is not erythematous, not retracted and not bulging.  Left Ear: Hearing, tympanic membrane, external ear and ear canal normal. Tympanic membrane is not erythematous, not retracted and not bulging.  Nose: No mucosal edema or rhinorrhea. Right sinus exhibits no maxillary sinus tenderness and no frontal sinus tenderness. Left sinus exhibits no maxillary sinus tenderness and no frontal sinus tenderness.  Mouth/Throat: Uvula is midline, oropharynx is clear and moist and mucous membranes are normal.  Eyes: Conjunctivae, EOM and lids are normal. Pupils are equal, round, and reactive to light. Lids are everted and swept, no foreign bodies found.  Neck: Trachea normal and normal range of motion. Neck supple. Carotid bruit is not present. No thyroid mass and no thyromegaly present.  Cardiovascular: Normal rate, regular rhythm, S1 normal, S2 normal, normal heart sounds, intact distal pulses and normal pulses. Exam reveals no gallop and no friction rub.  No murmur heard. Pulmonary/Chest: Effort normal and breath sounds normal. No tachypnea. No respiratory distress. She has no decreased breath sounds. She has no wheezes. She has no rhonchi. She has no rales.  Abdominal: Soft. Normal appearance and bowel sounds are normal. There is no hepatosplenomegaly. There is tenderness in the epigastric area. There is no rigidity, no rebound, no guarding and no  CVA tenderness.  Neurological: She appears lethargic.  Skin: Skin is warm, dry and intact. No rash noted.  Psychiatric: Her speech is normal and behavior is normal. Judgment and thought content normal. Her mood appears not anxious. Cognition and memory are normal. She does not exhibit a depressed  mood.          Assessment & Plan:

## 2017-07-09 LAB — CBC WITH DIFFERENTIAL/PLATELET
Basophils Absolute: 76 cells/uL (ref 0–200)
Basophils Relative: 0.7 %
Eosinophils Absolute: 392 cells/uL (ref 15–500)
Eosinophils Relative: 3.6 %
HCT: 34.5 % — ABNORMAL LOW (ref 35.0–45.0)
Hemoglobin: 12 g/dL (ref 11.7–15.5)
Lymphs Abs: 1199 cells/uL (ref 850–3900)
MCH: 30.5 pg (ref 27.0–33.0)
MCHC: 34.8 g/dL (ref 32.0–36.0)
MCV: 87.6 fL (ref 80.0–100.0)
MPV: 11.8 fL (ref 7.5–12.5)
Monocytes Relative: 8.7 %
Neutro Abs: 8284 cells/uL — ABNORMAL HIGH (ref 1500–7800)
Neutrophils Relative %: 76 %
Platelets: 442 10*3/uL — ABNORMAL HIGH (ref 140–400)
RBC: 3.94 10*6/uL (ref 3.80–5.10)
RDW: 12.4 % (ref 11.0–15.0)
Total Lymphocyte: 11 %
WBC mixed population: 948 cells/uL (ref 200–950)
WBC: 10.9 10*3/uL — ABNORMAL HIGH (ref 3.8–10.8)

## 2017-07-12 ENCOUNTER — Inpatient Hospital Stay (HOSPITAL_COMMUNITY)
Admission: EM | Admit: 2017-07-12 | Discharge: 2017-07-15 | DRG: 437 | Disposition: A | Discharge status: Home / Self Care | Payer: Medicare Other | Attending: Internal Medicine | Admitting: Internal Medicine

## 2017-07-12 ENCOUNTER — Emergency Department (HOSPITAL_COMMUNITY): Payer: Medicare Other

## 2017-07-12 ENCOUNTER — Other Ambulatory Visit: Payer: Self-pay

## 2017-07-12 ENCOUNTER — Encounter (HOSPITAL_COMMUNITY): Payer: Self-pay | Admitting: Emergency Medicine

## 2017-07-12 DIAGNOSIS — D72829 Elevated white blood cell count, unspecified: Secondary | ICD-10-CM | POA: Diagnosis not present

## 2017-07-12 DIAGNOSIS — Z90711 Acquired absence of uterus with remaining cervical stump: Secondary | ICD-10-CM

## 2017-07-12 DIAGNOSIS — R112 Nausea with vomiting, unspecified: Secondary | ICD-10-CM

## 2017-07-12 DIAGNOSIS — K869 Disease of pancreas, unspecified: Secondary | ICD-10-CM | POA: Diagnosis not present

## 2017-07-12 DIAGNOSIS — R634 Abnormal weight loss: Secondary | ICD-10-CM | POA: Diagnosis present

## 2017-07-12 DIAGNOSIS — Z885 Allergy status to narcotic agent status: Secondary | ICD-10-CM

## 2017-07-12 DIAGNOSIS — R630 Anorexia: Secondary | ICD-10-CM | POA: Diagnosis present

## 2017-07-12 DIAGNOSIS — E119 Type 2 diabetes mellitus without complications: Secondary | ICD-10-CM | POA: Diagnosis present

## 2017-07-12 DIAGNOSIS — Z87442 Personal history of urinary calculi: Secondary | ICD-10-CM

## 2017-07-12 DIAGNOSIS — C259 Malignant neoplasm of pancreas, unspecified: Secondary | ICD-10-CM | POA: Diagnosis not present

## 2017-07-12 DIAGNOSIS — K8689 Other specified diseases of pancreas: Secondary | ICD-10-CM | POA: Diagnosis present

## 2017-07-12 DIAGNOSIS — Z888 Allergy status to other drugs, medicaments and biological substances status: Secondary | ICD-10-CM

## 2017-07-12 DIAGNOSIS — E1169 Type 2 diabetes mellitus with other specified complication: Secondary | ICD-10-CM | POA: Diagnosis present

## 2017-07-12 DIAGNOSIS — E860 Dehydration: Secondary | ICD-10-CM | POA: Diagnosis present

## 2017-07-12 DIAGNOSIS — M170 Bilateral primary osteoarthritis of knee: Secondary | ICD-10-CM | POA: Diagnosis present

## 2017-07-12 DIAGNOSIS — Z87891 Personal history of nicotine dependence: Secondary | ICD-10-CM

## 2017-07-12 DIAGNOSIS — E785 Hyperlipidemia, unspecified: Secondary | ICD-10-CM | POA: Diagnosis present

## 2017-07-12 DIAGNOSIS — D630 Anemia in neoplastic disease: Secondary | ICD-10-CM | POA: Diagnosis present

## 2017-07-12 DIAGNOSIS — E7800 Pure hypercholesterolemia, unspecified: Secondary | ICD-10-CM | POA: Diagnosis present

## 2017-07-12 DIAGNOSIS — G43909 Migraine, unspecified, not intractable, without status migrainosus: Secondary | ICD-10-CM | POA: Diagnosis present

## 2017-07-12 DIAGNOSIS — M1612 Unilateral primary osteoarthritis, left hip: Secondary | ICD-10-CM | POA: Diagnosis present

## 2017-07-12 DIAGNOSIS — D473 Essential (hemorrhagic) thrombocythemia: Secondary | ICD-10-CM | POA: Diagnosis not present

## 2017-07-12 DIAGNOSIS — Z8249 Family history of ischemic heart disease and other diseases of the circulatory system: Secondary | ICD-10-CM

## 2017-07-12 DIAGNOSIS — Z8261 Family history of arthritis: Secondary | ICD-10-CM

## 2017-07-12 DIAGNOSIS — R933 Abnormal findings on diagnostic imaging of other parts of digestive tract: Secondary | ICD-10-CM

## 2017-07-12 DIAGNOSIS — Z79899 Other long term (current) drug therapy: Secondary | ICD-10-CM

## 2017-07-12 DIAGNOSIS — G8929 Other chronic pain: Secondary | ICD-10-CM | POA: Diagnosis present

## 2017-07-12 DIAGNOSIS — M8580 Other specified disorders of bone density and structure, unspecified site: Secondary | ICD-10-CM | POA: Diagnosis present

## 2017-07-12 DIAGNOSIS — I341 Nonrheumatic mitral (valve) prolapse: Secondary | ICD-10-CM | POA: Diagnosis present

## 2017-07-12 DIAGNOSIS — Z808 Family history of malignant neoplasm of other organs or systems: Secondary | ICD-10-CM

## 2017-07-12 DIAGNOSIS — Z880 Allergy status to penicillin: Secondary | ICD-10-CM

## 2017-07-12 DIAGNOSIS — R7989 Other specified abnormal findings of blood chemistry: Secondary | ICD-10-CM | POA: Diagnosis present

## 2017-07-12 DIAGNOSIS — M19021 Primary osteoarthritis, right elbow: Secondary | ICD-10-CM | POA: Diagnosis present

## 2017-07-12 DIAGNOSIS — Z825 Family history of asthma and other chronic lower respiratory diseases: Secondary | ICD-10-CM

## 2017-07-12 DIAGNOSIS — K219 Gastro-esophageal reflux disease without esophagitis: Secondary | ICD-10-CM | POA: Diagnosis present

## 2017-07-12 DIAGNOSIS — R6881 Early satiety: Secondary | ICD-10-CM | POA: Diagnosis present

## 2017-07-12 DIAGNOSIS — F5104 Psychophysiologic insomnia: Secondary | ICD-10-CM | POA: Diagnosis present

## 2017-07-12 DIAGNOSIS — I700 Atherosclerosis of aorta: Secondary | ICD-10-CM | POA: Diagnosis present

## 2017-07-12 DIAGNOSIS — Z803 Family history of malignant neoplasm of breast: Secondary | ICD-10-CM

## 2017-07-12 HISTORY — DX: Adverse effect of unspecified anesthetic, initial encounter: T41.45XA

## 2017-07-12 HISTORY — DX: Type 2 diabetes mellitus without complications: E11.9

## 2017-07-12 HISTORY — DX: Headache, unspecified: R51.9

## 2017-07-12 HISTORY — DX: Nausea with vomiting, unspecified: R11.2

## 2017-07-12 HISTORY — DX: Headache: R51

## 2017-07-12 HISTORY — DX: Personal history of urinary calculi: Z87.442

## 2017-07-12 HISTORY — DX: Other complications of anesthesia, initial encounter: T88.59XA

## 2017-07-12 HISTORY — DX: Other specified postprocedural states: Z98.890

## 2017-07-12 LAB — CBC
HCT: 34.7 % — ABNORMAL LOW (ref 36.0–46.0)
HCT: 31.9 % — ABNORMAL LOW (ref 36.0–46.0)
Hemoglobin: 11.4 g/dL — ABNORMAL LOW (ref 12.0–15.0)
Hemoglobin: 10.4 g/dL — ABNORMAL LOW (ref 12.0–15.0)
MCH: 30.2 pg (ref 26.0–34.0)
MCH: 29.9 pg (ref 26.0–34.0)
MCHC: 32.9 g/dL (ref 30.0–36.0)
MCHC: 32.6 g/dL (ref 30.0–36.0)
MCV: 92 fL (ref 78.0–100.0)
MCV: 91.7 fL (ref 78.0–100.0)
Platelets: 589 10*3/uL — ABNORMAL HIGH (ref 150–400)
Platelets: 572 10*3/uL — ABNORMAL HIGH (ref 150–400)
RBC: 3.77 MIL/uL — ABNORMAL LOW (ref 3.87–5.11)
RBC: 3.48 MIL/uL — ABNORMAL LOW (ref 3.87–5.11)
RDW: 14 % (ref 11.5–15.5)
RDW: 14 % (ref 11.5–15.5)
WBC: 11 10*3/uL — ABNORMAL HIGH (ref 4.0–10.5)
WBC: 10.2 10*3/uL (ref 4.0–10.5)

## 2017-07-12 LAB — URINALYSIS, ROUTINE W REFLEX MICROSCOPIC
Bilirubin Urine: NEGATIVE
Glucose, UA: NEGATIVE mg/dL
Hgb urine dipstick: NEGATIVE
Ketones, ur: NEGATIVE mg/dL
Nitrite: NEGATIVE
Protein, ur: NEGATIVE mg/dL
Specific Gravity, Urine: 1.002 — ABNORMAL LOW (ref 1.005–1.030)
pH: 6 (ref 5.0–8.0)

## 2017-07-12 LAB — COMPREHENSIVE METABOLIC PANEL
ALT: 15 U/L (ref 14–54)
AST: 17 U/L (ref 15–41)
Albumin: 2.4 g/dL — ABNORMAL LOW (ref 3.5–5.0)
Alkaline Phosphatase: 93 U/L (ref 38–126)
Anion gap: 15 (ref 5–15)
BUN: 7 mg/dL (ref 6–20)
CO2: 21 mmol/L — ABNORMAL LOW (ref 22–32)
Calcium: 8.8 mg/dL — ABNORMAL LOW (ref 8.9–10.3)
Chloride: 100 mmol/L — ABNORMAL LOW (ref 101–111)
Creatinine, Ser: 1.08 mg/dL — ABNORMAL HIGH (ref 0.44–1.00)
GFR calc Af Amer: >60 mL/min (ref >60)
GFR calc non Af Amer: 52 mL/min — ABNORMAL LOW (ref >60)
Glucose, Bld: 163 mg/dL — ABNORMAL HIGH (ref 65–99)
Potassium: 3.5 mmol/L (ref 3.5–5.1)
Sodium: 136 mmol/L (ref 135–145)
Total Bilirubin: 0.4 mg/dL (ref 0.3–1.2)
Total Protein: 8.1 g/dL (ref 6.5–8.1)

## 2017-07-12 LAB — CREATININE, SERUM
Creatinine, Ser: 1.02 mg/dL — ABNORMAL HIGH (ref 0.44–1.00)
GFR calc Af Amer: >60 mL/min (ref >60)
GFR calc non Af Amer: 56 mL/min — ABNORMAL LOW (ref >60)

## 2017-07-12 LAB — LIPASE, BLOOD: Lipase: 78 U/L — ABNORMAL HIGH (ref 11–51)

## 2017-07-12 LAB — GLUCOSE, CAPILLARY: Glucose-Capillary: 117 mg/dL — ABNORMAL HIGH (ref 65–99)

## 2017-07-12 MED ORDER — INSULIN ASPART 100 UNIT/ML ~~LOC~~ SOLN
0.0000 [IU] | Freq: Three times a day (TID) | SUBCUTANEOUS | Status: DC
  Administered 2017-07-15: 1 [IU] via SUBCUTANEOUS

## 2017-07-12 MED ORDER — ONDANSETRON HCL 4 MG PO TABS
4.0000 mg | ORAL_TABLET | Freq: Four times a day (QID) | ORAL | Status: DC | PRN
  Administered 2017-07-13: 4 mg via ORAL
  Filled 2017-07-12: qty 1

## 2017-07-12 MED ORDER — SODIUM CHLORIDE 0.9 % IV SOLN
INTRAVENOUS | Status: DC
  Administered 2017-07-12 – 2017-07-13 (×2): via INTRAVENOUS

## 2017-07-12 MED ORDER — IOPAMIDOL (ISOVUE-300) INJECTION 61%
INTRAVENOUS | Status: AC
  Administered 2017-07-12: 100 mL via INTRAVENOUS
  Filled 2017-07-12: qty 100

## 2017-07-12 MED ORDER — GABAPENTIN 300 MG PO CAPS
300.0000 mg | ORAL_CAPSULE | Freq: Two times a day (BID) | ORAL | Status: DC
  Administered 2017-07-12: 300 mg via ORAL
  Filled 2017-07-12 (×2): qty 1

## 2017-07-12 MED ORDER — SODIUM CHLORIDE 0.9 % IV BOLUS (SEPSIS)
1000.0000 mL | Freq: Once | INTRAVENOUS | Status: AC
  Administered 2017-07-12: 1000 mL via INTRAVENOUS

## 2017-07-12 MED ORDER — METOCLOPRAMIDE HCL 10 MG PO TABS
10.0000 mg | ORAL_TABLET | Freq: Three times a day (TID) | ORAL | Status: DC
  Administered 2017-07-12: 10 mg via ORAL
  Filled 2017-07-12: qty 1

## 2017-07-12 MED ORDER — PROMETHAZINE HCL 25 MG PO TABS
12.5000 mg | ORAL_TABLET | Freq: Four times a day (QID) | ORAL | Status: DC | PRN
  Administered 2017-07-13 (×2): 12.5 mg via ORAL
  Filled 2017-07-12 (×2): qty 1

## 2017-07-12 MED ORDER — ENOXAPARIN SODIUM 40 MG/0.4ML ~~LOC~~ SOLN
40.0000 mg | SUBCUTANEOUS | Status: DC
  Administered 2017-07-13 – 2017-07-15 (×3): 40 mg via SUBCUTANEOUS
  Filled 2017-07-12 (×4): qty 0.4

## 2017-07-12 MED ORDER — PANTOPRAZOLE SODIUM 40 MG PO TBEC
40.0000 mg | DELAYED_RELEASE_TABLET | Freq: Every day | ORAL | Status: DC
  Administered 2017-07-13 – 2017-07-15 (×3): 40 mg via ORAL
  Filled 2017-07-12 (×3): qty 1

## 2017-07-12 MED ORDER — ACETAMINOPHEN 650 MG RE SUPP: 650.0000 mg | Freq: Four times a day (QID) | RECTAL | Status: DC | PRN

## 2017-07-12 MED ORDER — ONDANSETRON 4 MG PO TBDP
4.0000 mg | ORAL_TABLET | Freq: Once | ORAL | Status: AC | PRN
  Administered 2017-07-12: 4 mg via ORAL
  Filled 2017-07-12: qty 1

## 2017-07-12 MED ORDER — ACETAMINOPHEN 325 MG PO TABS: 650.0000 mg | ORAL_TABLET | Freq: Four times a day (QID) | ORAL | Status: DC | PRN

## 2017-07-12 MED ORDER — ONDANSETRON HCL 4 MG/2ML IJ SOLN
4.0000 mg | Freq: Four times a day (QID) | INTRAMUSCULAR | Status: DC | PRN
  Administered 2017-07-13 – 2017-07-15 (×4): 4 mg via INTRAVENOUS
  Filled 2017-07-12 (×4): qty 2

## 2017-07-12 MED ORDER — KETOROLAC TROMETHAMINE 15 MG/ML IJ SOLN: 15.0000 mg | Freq: Four times a day (QID) | INTRAMUSCULAR | Status: DC | PRN

## 2017-07-12 MED ORDER — INSULIN ASPART 100 UNIT/ML ~~LOC~~ SOLN: 0.0000 [IU] | Freq: Every day | SUBCUTANEOUS | Status: DC

## 2017-07-12 MED ORDER — METOCLOPRAMIDE HCL 5 MG/ML IJ SOLN
10.0000 mg | Freq: Once | INTRAMUSCULAR | Status: AC
  Administered 2017-07-12: 10 mg via INTRAVENOUS
  Filled 2017-07-12: qty 2

## 2017-07-12 NOTE — Plan of Care (Signed)
Progressing

## 2017-07-12 NOTE — ED Triage Notes (Signed)
Pt reports for over two weeks she has been nausea with vomiting often after eating, Pt reports loss of appetite and weight loss. Pt seen PCP and GI MD and has out patient CT but unable to scheduled at this time.

## 2017-07-12 NOTE — Care Management Note (Signed)
Case Management Note  Patient Details  Name: Debra Leach MRN: [id number] Date of Birth: April 11, 1951  Subjective/Objective:                  Nausea, fever  Action/Plan: [facility] spoke with the patient at the bedside. Patient lives at home with her husband who is able to assist her as needed. She does not use DME or receive any home health services. Unit CM to continue to follow for discharge needs.   Expected Discharge Date:   unknown             Expected Discharge Plan:  Home/Self Care  In-House Referral:     Discharge planning Services  CM Consult  Post Acute Care Choice:    Choice offered to:     DME Arranged:    DME Agency:     HH Arranged:    HH Agency:     Status of Service:  In process, will continue to follow  If discussed at Long Length of Stay Meetings, dates discussed:    Additional Comments:  Apolonio Schneiders, RN 07/12/2017, 7:50 PM

## 2017-07-12 NOTE — ED Notes (Signed)
Patient still in CT.

## 2017-07-12 NOTE — Assessment & Plan Note (Signed)
Most concerning for cancer diagnosis. 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 Has upcoming CT abd. Slightly abnormal cbc.. Will re-eval along with diff to determine if could be causing issue.

## 2017-07-12 NOTE — ED Provider Notes (Signed)
[facility] EMERGENCY DEPARTMENT Provider Note   CSN: [id number] Arrival date & time: 07/12/17  0706     History   Chief Complaint Chief Complaint  Patient presents with  . Nausea  . Anorexia    HPI Debra Leach is a 67 y.o. female with history of hyperlipidemia, GERD, mitral valve prolapse who presents with a several month history of nausea and decreased appetite.  She has had worsening symptoms over the past 5 weeks.  She has not been able to keep much down for the past week, including fluids.  Patient has been seen by her primary care provider and GI doctor who ordered a CT with pancreatic protocol, to assess for pancreatic neoplasm, however patient has been able to schedule it.  Considering her worsening symptoms, her doctor advised she come to the emergency department today.  Patient reports she has lost about 15 pounds in the past 5 weeks.  She has had fevers up to 102 each night.  She denies night sweats.  She denies any significant abdominal pain, but has had some mild left upper quadrant pain.  She denies any diarrhea.  She has had associated decreased appetite and early satiety.  HPI  Past Medical History:  Diagnosis Date  . Arthritis    osteoarthritis B knees, left hip , right elbow  . GERD (gastroesophageal reflux disease)   . Hyperlipidemia   . MVP (mitral valve prolapse)    history of    Patient Active Problem List   Diagnosis Date Noted  . Pancreatic mass 07/12/2017  . Dehydration 07/12/2017  . Abnormal weight loss 07/06/2017  . Eustachian tube disorder, bilateral 12/16/2016  . Vertigo 12/16/2016  . Esophageal dysphagia 11/22/2016  . Chronic low back pain 10/07/2016  . Vision changes 08/12/2016  . Aortic atherosclerosis ([place]) 06/11/2016  . Supraclavicular fossa fullness 06/08/2016  . Family history of aortic aneurysm 08/20/2014  . Vitamin D deficiency 08/20/2014  . Diabetes mellitus with no complication ([place]) 63/14/9702  . OSTEOPENIA  11/06/2009  . Hyperlipidemia 07/05/2008  . INSOMNIA, CHRONIC 07/05/2008  . Migraine with aura 07/05/2008  . GERD 07/05/2008  . OSTEOARTHRITIS 07/05/2008  . MITRAL VALVE PROLAPSE, HX OF 07/05/2008  . NEPHROLITHIASIS, HX OF 07/05/2008    Past Surgical History:  Procedure Laterality Date  . arm surgery    . BREAST BIOPSY Left   . BREAST SURGERY  2000   breast biopsy (benign)  . LITHOTRIPSY  08-12-2008   stent placed bilaterally  . PARTIAL HYSTERECTOMY     Both ovaries remain, vaginal, for mennorhagia  . TONSILLECTOMY    . TUBAL LIGATION  1980    OB History    No data available       Home Medications    Prior to Admission medications   Medication Sig Start Date End Date Taking? Authorizing Provider  atorvastatin (LIPITOR) 80 MG tablet Take 1 tablet (80 mg total) by mouth daily. 03/01/17   Bedsole, Amy E, MD  Blood Glucose Monitoring Suppl ([place]) w/Device KIT 1 each by Does not apply route See admin instructions. Check blood sugar daily.  Dx: E11.9 12/11/16   [name], MD  cholecalciferol (VITAMIN D) 1000 units tablet Take 1,000 Units by mouth daily.    [provider]  clotrimazole-betamethasone (LOTRISONE) cream APPLY TO AFFECTED AREA(S) AS NEEDED 03/01/17   Diona Browner, Amy E, MD  colesevelam (WELCHOL) 625 MG tablet Take 1,875 mg by mouth 2 (two) times daily with a meal.  [provider]  Erenumab-aooe (AIMOVIG 140 DOSE) 70 MG/ML SOAJ Inject 140 mg into the skin every 30 (thirty) days. 06/13/17   Pieter Partridge, DO  gabapentin (NEURONTIN) 300 MG capsule TAKE TWO CAPSULES BY MOUTH TWO TIMES A DAY 05/09/17   Tomi Likens, Adam R, DO  meclizine (ANTIVERT) 25 MG tablet Take 25-50 mg by mouth as needed for dizziness.    [provider]  omeprazole (PRILOSEC) 20 MG capsule Take 1 capsule (20 mg total) by mouth 2 (two) times daily before a meal. 05/24/17   Bedsole, Amy E, MD  ondansetron (ZOFRAN) 4 MG tablet Take 1 tablet (4 mg total) by mouth  every 8 (eight) hours as needed for nausea or vomiting. 05/09/17   Tomi Likens, Adam R, DO  ONETOUCH DELICA LANCETS 93X MISC Check blood sugar daily.  Dx: E11.9 05/24/17   [name], MD  [facility] VERIO test strip Check blood sugar daily.  Dx: E11.9 05/24/17   [name], MD  SUMAtriptan (IMITREX) 6 MG/0.5ML SOLN injection Inject 0.5 mLs (6 mg total) into the skin every 2 (two) hours as needed for migraine (May repeast dose once after 2 hrs if needed, not to exceed 2 doses in 24 hours). 05/09/17   Pieter Partridge, DO  Syringe/Needle, Disp, (SYRINGE LUER LOCK) 25G X 5/8" 3 ML MISC Use as needed 09/16/16   Pieter Partridge, DO    Family History Family History  Problem Relation Age of Onset  . Cancer Mother        bone  . Hypertension Father   . Mitral valve prolapse Father   . Asthma Brother   . Arthritis Brother   . Nephrolithiasis Brother   . Nephrolithiasis Brother   . Asthma Brother   . Arthritis Brother   . Aortic aneurysm Brother        ascending aortic aneuysm  . Breast cancer Maternal Aunt   . Breast cancer Maternal Aunt     Social History Social History   Tobacco Use  . Smoking status: Former Research scientist (life sciences)  . Smokeless tobacco: Never Used  Substance Use Topics  . Alcohol use: Yes    Comment: rare  . Drug use: No     Allergies   Atenolol; Codeine; Penicillins; Pentazocine lactate; and Crestor [rosuvastatin]   Review of Systems Review of Systems  Constitutional: Positive for appetite change, fever and unexpected weight change. Negative for chills.  HENT: Negative for facial swelling and sore throat.   Respiratory: Negative for shortness of breath.   Cardiovascular: Negative for chest pain.  Gastrointestinal: Positive for nausea and vomiting. Negative for abdominal pain, blood in stool and diarrhea.  Genitourinary: Negative for dysuria.  Musculoskeletal: Negative for back pain.  Skin: Negative for rash and wound.  Neurological: Negative for headaches.    Psychiatric/Behavioral: The patient is not nervous/anxious.      Physical Exam Updated Vital Signs BP 137/60 (BP Location: Right Arm)   Pulse (!) 101   Temp 98.1 F (36.7 C) (Oral)   Resp 16   Ht 5' (1.524 m)   Wt 59.9 kg (132 lb)   SpO2 98%   BMI 25.78 kg/m   Physical Exam  Constitutional: She appears well-developed and well-nourished. No distress.  HENT:  Head: Normocephalic and atraumatic.  Mouth/Throat: Oropharynx is clear and moist. No oropharyngeal exudate.  Eyes: Conjunctivae are normal. Pupils are equal, round, and reactive to light. Right eye exhibits no discharge. Left eye exhibits no discharge. No scleral icterus.  Neck:  Normal range of motion. Neck supple. No thyromegaly present.  Cardiovascular: Normal rate, regular rhythm, normal heart sounds and intact distal pulses. Exam reveals no gallop and no friction rub.  No murmur heard. Pulmonary/Chest: Effort normal and breath sounds normal. No stridor. No respiratory distress. She has no wheezes. She has no rales.  Abdominal: Soft. Bowel sounds are normal. She exhibits no distension. There is tenderness (mild, no significant pain, per patient) in the left upper quadrant. There is no rebound and no guarding.  Musculoskeletal: She exhibits no edema.  Lymphadenopathy:    She has no cervical adenopathy.  Neurological: She is alert. Coordination normal.  Skin: Skin is warm and dry. No rash noted. She is not diaphoretic. No pallor.  Psychiatric: She has a normal mood and affect.  Nursing note and vitals reviewed.    ED Treatments / Results  Labs (all labs ordered are listed, but only abnormal results are displayed) Labs Reviewed  LIPASE, BLOOD - Abnormal; Notable for the following components:      Result Value   Lipase 78 (*)    All other components within normal limits  COMPREHENSIVE METABOLIC PANEL - Abnormal; Notable for the following components:   Chloride 100 (*)    CO2 21 (*)    Glucose, Bld 163 (*)     Creatinine, Ser 1.08 (*)    Calcium 8.8 (*)    Albumin 2.4 (*)    GFR calc non Af Amer 52 (*)    All other components within normal limits  CBC - Abnormal; Notable for the following components:   WBC 11.0 (*)    RBC 3.77 (*)    Hemoglobin 11.4 (*)    HCT 34.7 (*)    Platelets 589 (*)    All other components within normal limits  URINALYSIS, ROUTINE W REFLEX MICROSCOPIC - Abnormal; Notable for the following components:   Color, Urine STRAW (*)    Specific Gravity, Urine 1.002 (*)    Leukocytes, UA SMALL (*)    Bacteria, UA RARE (*)    Squamous Epithelial / LPF 0-5 (*)    All other components within normal limits    EKG  EKG Interpretation None       Radiology Ct Angio Abdomen W And/or Wo Contrast  Result Date: 07/12/2017 CLINICAL DATA:  Evaluate for pancreas mass. EXAM: CT ANGIOGRAPHY ABDOMEN TECHNIQUE: Multidetector CT imaging of the abdomen was performed using the standard protocol during bolus administration of intravenous contrast. Multiplanar reconstructed images and MIPs were obtained and reviewed to evaluate the vascular anatomy. CONTRAST:  <See Chart> ISOVUE-300 IOPAMIDOL (ISOVUE-300) INJECTION 61% COMPARISON:  None FINDINGS: VASCULAR Aorta: Mild aortic atherosclerosis.  No aneurysm. Celiac: The celiac trunk appears patent and uninvolved by mass. SMA: The superior mesenteric artery is also patent and uninvolved.T Renals: Both renal arteries are patent without evidence of aneurysm, dissection, vasculitis, fibromuscular dysplasia or significant stenosis. IMA: Patent without evidence of aneurysm, dissection, vasculitis or significant stenosis. Inflow: Patent without evidence of aneurysm, dissection, vasculitis or significant stenosis. Veins: No obvious venous abnormality within the limitations of this arterial phase study. The portal venous confluence and the portal vein appear patent and uninvolved by pancreas mass. There may be contact of the superior mesenteric vein with the  pancreas head mass, image 45 of series 8, but no encasement. Review of the MIP images confirms the above findings. NON-VASCULAR Lower chest: Perifissural nodule within the right mid lung measures 8 mm, image 3 of series 4. Nodular areas areas of subpleural  consolidation noted within both lung bases posteriorly. The largest overlies the posteromedial left lower lobe measuring 1.9 cm, image 11 of series 4. Hepatobiliary: No focal liver abnormality identified. Gallbladder normal. No biliary dilatation. Pancreas: Hypoenhancing soft tissue mass involving the head and uncinate process of the pancreas is identified. This measures 3.1 by 1.9 by 4.0 cm, image 53 of series 4 and image 48 of series 8. There is no main duct dilatation. The common bile duct does not appear obstructed. Spleen: Normal appearance of the spleen. Adrenals/Urinary Tract: Normal adrenal glands. Multiple bilateral hypoenhancing kidney masses are identified. The largest is in the upper pole of the left kidney measuring 5.2 cm, image 31 of series 6. In the right kidney there is a mass measuring 4.0 cm. No hydronephrosis identified. Stomach/Bowel: Small hiatal hernia. No abnormal dilatation of the visualized small or large bowel loops. Lymphatic: No pathologically enlarged lymph nodes identified within the upper abdomen. Other: There is no ascites.  No peritoneal nodularity identified. Musculoskeletal: No suspicious bone lesions. IMPRESSION: 1. There is a mass involving the head of pancreas and uncinate process which is suspicious for primary pancreatic adenocarcinoma. Further evaluation with endoscopic ultrasound and tissue sampling is advised. No upper abdominal adenopathy, liver metastasis or evidence of involvement of the celiac artery, superior mesenteric artery, or portal vein. 2. Multiple bilateral kidney lesions are identified. In the setting of pancreatic adenocarcinoma findings are suspicious for metastatic disease. Other differential  considerations include lymphoma, multiple renal cell carcinomas or multifocal urothelial carcinoma (though this is considered less likely.) 3. Nodular areas of subpleural consolidation within the lung bases likely postinflammatory. Cannot rule out metastatic disease. Electronically Signed   By: Kerby Moors M.D.   On: 07/12/2017 14:51    Procedures Procedures (including critical care time)  Medications Ordered in ED Medications  insulin aspart (novoLOG) injection 0-9 Units (not administered)  insulin aspart (novoLOG) injection 0-5 Units (not administered)  ondansetron (ZOFRAN-ODT) disintegrating tablet 4 mg (4 mg Oral Given 07/12/17 1127)  sodium chloride 0.9 % bolus 1,000 mL (1,000 mLs Intravenous New Bag/Given 07/12/17 1423)  iopamidol (ISOVUE-300) 61 % injection (100 mLs Intravenous Contrast Given 07/12/17 1354)  metoCLOPramide (REGLAN) injection 10 mg (10 mg Intravenous Given 07/12/17 1448)     Initial Impression / Assessment and Plan / ED Course  I have reviewed the triage vital signs and the nursing notes.  Pertinent labs & imaging results that were available during my care of the patient were reviewed by me and considered in my medical decision making (see chart for details).  Clinical Course as of Jul 13 1615  Tue Jul 12, 2017  1422 I was informed by nursing staff that Zofran ODT did not improve patient's nausea.  Will order Reglan 10 mg at this time.  CT angio abdomen is pending.  [AL]    Clinical Course User Index [AL] Frederica Kuster, PA-C    Patient with several months of nausea and weight loss.  Patient has had fevers at night for the past 1 week.  She has not been able to keep any food or fluids down for the past 1 week.  Patient sent here by her doctor for further workup considering progressive symptoms and CT study of her pancreas.  CBC shows WBC 11, hemoglobin 11.4, platelets 580 9K.  CMP shows creatinine 1.08, which is elevated from patient's baseline of around 0.75,  glucose 163, and albumin 2.4.  Lipase 78.  Urinalysis shows small leukocytes, rare bacteria.  CT angios abdomen  shows: 1. There is a mass involving the head of pancreas and uncinate process which is suspicious for primary pancreatic adenocarcinoma. Further evaluation with endoscopic ultrasound and tissue sampling is advised. No upper abdominal adenopathy, liver metastasis or evidence of involvement of the celiac artery, superior mesenteric artery, or portal vein. 2. Multiple bilateral kidney lesions are identified. In the setting of pancreatic adenocarcinoma findings are suspicious for metastatic disease. Other differential considerations include lymphoma, multiple renal cell carcinomas or multifocal urothelial carcinoma (though this is considered less likely.) 3. Nodular areas of subpleural consolidation within the lung bases likely postinflammatory. Cannot rule out metastatic disease.  I discussed these findings with patient and her husband and they are agreeable to admission for treatment of dehydration and further management of the above findings.  I spoke with Dr. Starla Link with [facility] who will admit the patient for further evaluation and treatment.  I spoke with patient's gastroenterologist, Dr. Earlean Shawl, who advised the patient cannot consult in the [facility] system (but is an "affiliate"), however would be happy to follow-up with the patient outpatient if she is able to leave the hospital prior to completing further workup for her cancer diagnosis. Dr. Starla Link will consult gastroenterology on call if further workup needs to be completed during her admission.  I appreciate this consultants time and assistance.  Patient also evaluated by Dr. Ashok Cordia who guided the patient's management and agrees with plan.  Final Clinical Impressions(s) / ED Diagnoses   Final diagnoses:  Pancreatic mass  Nausea and vomiting in adult  Dehydration    ED Discharge Orders    None       Frederica Kuster,  PA-C 07/12/17 1619    Lajean Saver, MD 07/13/17 1331

## 2017-07-12 NOTE — Consult Note (Signed)
[facility] Consult: 3:58 PM 07/12/2017  LOS: 0 days    Referring Provider: Dr  Starla Link Primary Care Physician:  [name], MD Primary Gastroenterologist:  Dr. Richmond Campbell at [facility] for Consultation:  Pancreatic mass.   HPI: Debra Leach is a 67 y.o. female.  PMH Diabetes, controlled without medications.  Migraine headaches.  Hypercholesterolemia.  Chronic low back pain. Kidney stones, s/p lithotripsy and stent placement 2010. GERD.  Previous screening colonoscopies by Dr. Earlean Shawl.  The last was in the summer 2014.  No polyps.  Previous EGD on 2 occasions.  The last was performed in summer 2018 for dysphasia.  She did not have a stricture but he empirically dilated her esophagus and her dysphasia improved.   Several weeks of anorexia, early satiety, prandial nausea and vomiting which is worse with solids.  She switched over to liquids which she will sometimes vomit as well but she has maintained hydration drinking a lot of water.  She is dropped 15 pounds.  She feels very weak and spends most of her time sleeping.  No abdominal pain or abdominal bloating.  Stools have decreased in frequency with the low p.o. intake. Seen 1/30 by Dr. Earlean Shawl at [facility].    Dr. Earlean Shawl was worried about underlying neoplasm and planned CT scan this week.  CA 19 9 was normal at 11.   LFTs were also normal.  Hgb 12.2.  He provided her with a prescription for Zofran which is not helping.  She already takes chronic Omeprazole.   CT scan had yet to be arranged and her symptoms were persisting.  She has gotten so weak today her husband brought her to the emergency department.    CT scan shows a mass involving the head of the pancreas and uncinate process, suspicious for primary pancreatic adenocarcinoma.   Suggest further evaluation with EUS and tissue sampling.  Also noted are multiple bilateral kidney lesions which in the setting of possible pancreatic adenocarcinoma are suspicious for metastatic disease but other considerations include lymphoma, renal cell carcinoma or urothelial carcinoma.  Nodular areas in the lungs are felt to be postinflammatory but unable to rule out metastatic disease. Lipase is 78, LFTs are normal.  Her albumin is low at 2.4.  Creatinine is mildly elevated.. Patient received IV Reglan which has helped her nausea.  She is receiving IV fluids    Past Medical History:  Diagnosis Date  . Arthritis    osteoarthritis B  knees, left hip , right elbow  . GERD (gastroesophageal reflux disease)   . Hyperlipidemia   . MVP (mitral valve prolapse)    history of    Past Surgical History:  Procedure Laterality Date  . arm surgery    . BREAST BIOPSY Left   . BREAST SURGERY  2000   breast biopsy (benign)  . LITHOTRIPSY  08-12-2008   stent placed bilaterally  . PARTIAL HYSTERECTOMY     Both ovaries remain, vaginal, for mennorhagia  . TONSILLECTOMY    . TUBAL LIGATION  1980    Prior to Admission medications   Medication Sig Start Date End Date Taking? Authorizing Provider  atorvastatin (LIPITOR) 80 MG tablet Take 1 tablet (80 mg total) by mouth daily. 03/01/17   Bedsole, Amy E, MD  Blood Glucose Monitoring Suppl ([place]) w/Device KIT 1 each by Does not apply route See admin instructions. Check blood sugar daily.  Dx: E11.9 12/11/16   [name], MD  cholecalciferol (VITAMIN D) 1000 units tablet Take 1,000 Units by mouth daily.    [provider]  clotrimazole-betamethasone (LOTRISONE) cream APPLY TO AFFECTED AREA(S) AS NEEDED 03/01/17   Diona Browner, Amy E, MD  colesevelam (WELCHOL) 625 MG tablet Take 1,875 mg by mouth 2 (two) times daily with a meal.    [provider]  Erenumab-aooe (AIMOVIG 140 DOSE) 70 MG/ML SOAJ Inject 140 mg into the skin  every 30 (thirty) days. 06/13/17   Pieter Partridge, DO  gabapentin (NEURONTIN) 300 MG capsule TAKE TWO CAPSULES BY MOUTH TWO TIMES A DAY 05/09/17   Tomi Likens, Adam R, DO  meclizine (ANTIVERT) 25 MG tablet Take 25-50 mg by mouth as needed for dizziness.    [provider]  omeprazole (PRILOSEC) 20 MG capsule Take 1 capsule (20 mg total) by mouth 2 (two) times daily before a meal. 05/24/17   Bedsole, Amy E, MD  ondansetron (ZOFRAN) 4 MG tablet Take 1 tablet (4 mg total) by mouth every 8 (eight) hours as needed for nausea or vomiting. 05/09/17   Tomi Likens, Adam R, DO  ONETOUCH DELICA LANCETS 75Z MISC Check blood sugar daily.  Dx: E11.9 05/24/17   [name], MD  [facility] VERIO test strip Check blood sugar daily.  Dx: E11.9 05/24/17   [name], MD  SUMAtriptan (IMITREX) 6 MG/0.5ML SOLN injection Inject 0.5 mLs (6 mg total) into the skin every 2 (two) hours as needed for migraine (May repeast dose once after 2 hrs if needed, not to exceed 2 doses in 24 hours). 05/09/17   Pieter Partridge, DO  Syringe/Needle, Disp, (SYRINGE LUER LOCK) 25G X 5/8" 3 ML MISC Use as needed 09/16/16   Pieter Partridge, DO    Scheduled Meds:  Infusions:  PRN Meds:    Allergies as of 07/12/2017 - Review Complete 07/12/2017  Allergen Reaction Noted  . Atenolol  09/06/2016  . Codeine    . Penicillins    . Pentazocine lactate    . Crestor [rosuvastatin] Other (See Comments) 09/26/2014    Family History  Problem Relation Age of Onset  . Cancer Mother        bone  . Hypertension Father   . Mitral valve prolapse Father   . Asthma Brother   . Arthritis Brother   . Nephrolithiasis Brother   . Nephrolithiasis Brother   . Asthma Brother   . Arthritis Brother   . Aortic aneurysm Brother        ascending  aortic aneuysm  . Breast cancer Maternal Aunt   . Breast cancer Maternal Aunt     Social History   Socioeconomic History  . Marital status: Married    Spouse name: Not on file  . Number of children: 0  .  Years of education: Not on file  . Highest education level: Not on file  Social Needs  . Financial resource strain: Not on file  . Food insecurity - worry: Not on file  . Food insecurity - inability: Not on file  . Transportation needs - medical: Not on file  . Transportation needs - non-medical: Not on file  Occupational History  . Occupation: retired Energy manager: retired  Tobacco Use  . Smoking status: Former Research scientist (life sciences)  . Smokeless tobacco: Never Used  Substance and Sexual Activity  . Alcohol use: Yes    Comment: rare  . Drug use: No  . Sexual activity: Not on file  Other Topics Concern  . Not on file  Social History Narrative   Regular exercise--yes, recumbent bike 3-4 days a week      Diet: fruits and veggies, water, eats at home, drinks a lot of milk    REVIEW OF SYSTEMS: Constitutional: Weakness, fatigue. ENT:  No nose bleeds Pulm: Shortness of breath or cough. CV:  No palpitations, no LE edema.  Chest pain GU:  No hematuria, no frequency.  Oliguria.  No amber colored urine. GI:  Per HPI Heme: No excessive or unusual bleeding or bruising.  He has history of anemia or need for iron, B12 supplements. Transfusions: None Neuro:  No headaches, no peripheral tingling or numbness Derm:  No itching, no rash or sores.  Endocrine:  No sweats or chills.  No polyuria or dysuria Immunization: Reviewed her multiple immunizations and she is up-to-date on flu, Pneumovax, Tdap, zoster. Travel:  None beyond local counties in last few months.    PHYSICAL EXAM: Vital signs in last 24 hours: Vitals:   07/12/17 1052 07/12/17 1526  BP: 129/68 137/60  Pulse: 93 (!) 101  Resp: 18 16  Temp: 98.1 F (36.7 C)   SpO2: 96% 98%   Wt Readings from Last 3 Encounters:  07/12/17 59.9 kg (132 lb)  07/08/17 61.9 kg (136 lb 8 oz)  05/27/17 66.1 kg (145 lb 12 oz)    General: Pleasant, somewhat weak and tired appearing but not acutely ill looking WF who appears her stated  age. Head: No signs of head trauma.  No facial asymmetry or swelling Eyes: No scleral icterus.  No conjunctival pallor.  EOMI. Ears: Not hard of hearing. Nose: Congestion or discharge. Mouth: Oropharynx moist, pink, clear.  Dentition in good repair.  Tongue midline. Neck: No JVD, no masses, no thyromegaly. Lungs: Clear bilaterally.  No cough or dyspnea. Heart: RRR.  No MRG.  S1, S2 present. Abdomen: Soft.  Not distended, not tender.  Active bowel sounds.  No HSM, masses, bruits, hernias..   Rectal: Deferred Musc/Skeltl: No joint swelling, redness or significant deformity. Extremities: No CCE. Neurologic: Alert.  Oriented x3.  Good historian.  Moves all 4 limbs, strength not tested.  No tremor. Skin: No jaundice.  No suspicious rashes or sores. Tattoos: None Nodes: No cervical adenopathy. Psych: Affect a little flat.  Pleasant, calm, cooperative.    Intake/Output from previous day: No intake/output data recorded. Intake/Output this shift: No intake/output data recorded.  LAB RESULTS: Recent Labs    07/12/17 0727  WBC 11.0*  HGB 11.4*  HCT  34.7*  PLT 589*   BMET Lab Results  Component Value Date   NA 136 07/12/2017   NA 146 (H) 05/24/2017   NA 142 02/24/2017   K 3.5 07/12/2017   K 4.3 05/24/2017   K 4.2 02/24/2017   CL 100 (L) 07/12/2017   CL 108 05/24/2017   CL 104 02/24/2017   CO2 21 (L) 07/12/2017   CO2 31 05/24/2017   CO2 33 (H) 02/24/2017   GLUCOSE 163 (H) 07/12/2017   GLUCOSE 103 (H) 05/24/2017   GLUCOSE 111 (H) 02/24/2017   BUN 7 07/12/2017   BUN 11 05/24/2017   BUN 12 02/24/2017   CREATININE 1.08 (H) 07/12/2017   CREATININE 0.74 05/24/2017   CREATININE 0.79 02/24/2017   CALCIUM 8.8 (L) 07/12/2017   CALCIUM 9.1 05/24/2017   CALCIUM 9.2 02/24/2017   LFT Recent Labs    07/12/17 0727  PROT 8.1  ALBUMIN 2.4*  AST 17  ALT 15  ALKPHOS 93  BILITOT 0.4   PT/INR No results found for: INR, PROTIME Hepatitis Panel No results for input(s): HEPBSAG,  HCVAB, HEPAIGM, HEPBIGM in the last 72 hours. C-Diff No components found for: CDIFF Lipase     Component Value Date/Time   LIPASE 78 (H) 07/12/2017 0727    Drugs of Abuse  No results found for: LABOPIA, COCAINSCRNUR, LABBENZ, AMPHETMU, THCU, LABBARB   RADIOLOGY STUDIES: Ct Angio Abdomen W And/or Wo Contrast  Result Date: 07/12/2017 CLINICAL DATA:  Evaluate for pancreas mass. EXAM: CT ANGIOGRAPHY ABDOMEN TECHNIQUE: Multidetector CT imaging of the abdomen was performed using the standard protocol during bolus administration of intravenous contrast. Multiplanar reconstructed images and MIPs were obtained and reviewed to evaluate the vascular anatomy. CONTRAST:  <See Chart> ISOVUE-300 IOPAMIDOL (ISOVUE-300) INJECTION 61% COMPARISON:  None FINDINGS: VASCULAR Aorta: Mild aortic atherosclerosis.  No aneurysm. Celiac: The celiac trunk appears patent and uninvolved by mass. SMA: The superior mesenteric artery is also patent and uninvolved.T Renals: Both renal arteries are patent without evidence of aneurysm, dissection, vasculitis, fibromuscular dysplasia or significant stenosis. IMA: Patent without evidence of aneurysm, dissection, vasculitis or significant stenosis. Inflow: Patent without evidence of aneurysm, dissection, vasculitis or significant stenosis. Veins: No obvious venous abnormality within the limitations of this arterial phase study. The portal venous confluence and the portal vein appear patent and uninvolved by pancreas mass. There may be contact of the superior mesenteric vein with the pancreas head mass, image 45 of series 8, but no encasement. Review of the MIP images confirms the above findings. NON-VASCULAR Lower chest: Perifissural nodule within the right mid lung measures 8 mm, image 3 of series 4. Nodular areas areas of subpleural consolidation noted within both lung bases posteriorly. The largest overlies the posteromedial left lower lobe measuring 1.9 cm, image 11 of series 4.  Hepatobiliary: No focal liver abnormality identified. Gallbladder normal. No biliary dilatation. Pancreas: Hypoenhancing soft tissue mass involving the head and uncinate process of the pancreas is identified. This measures 3.1 by 1.9 by 4.0 cm, image 53 of series 4 and image 48 of series 8. There is no main duct dilatation. The common bile duct does not appear obstructed. Spleen: Normal appearance of the spleen. Adrenals/Urinary Tract: Normal adrenal glands. Multiple bilateral hypoenhancing kidney masses are identified. The largest is in the upper pole of the left kidney measuring 5.2 cm, image 31 of series 6. In the right kidney there is a mass measuring 4.0 cm. No hydronephrosis identified. Stomach/Bowel: Small hiatal hernia. No abnormal dilatation of the visualized small  or large bowel loops. Lymphatic: No pathologically enlarged lymph nodes identified within the upper abdomen. Other: There is no ascites.  No peritoneal nodularity identified. Musculoskeletal: No suspicious bone lesions. IMPRESSION: 1. There is a mass involving the head of pancreas and uncinate process which is suspicious for primary pancreatic adenocarcinoma. Further evaluation with endoscopic ultrasound and tissue sampling is advised. No upper abdominal adenopathy, liver metastasis or evidence of involvement of the celiac artery, superior mesenteric artery, or portal vein. 2. Multiple bilateral kidney lesions are identified. In the setting of pancreatic adenocarcinoma findings are suspicious for metastatic disease. Other differential considerations include lymphoma, multiple renal cell carcinomas or multifocal urothelial carcinoma (though this is considered less likely.) 3. Nodular areas of subpleural consolidation within the lung bases likely postinflammatory. Cannot rule out metastatic disease. Electronically Signed   By: Kerby Moors M.D.   On: 07/12/2017 14:51     IMPRESSION:   *  Mass at head of pancreas.  This is worrisome for  adenocarcinoma and there are possible metastases to the kidneys and possibly lung.  CA-19-9 was not elevated last week.  Although her lipase is mildly elevated, LFTs are normal. Several weeks of anorexia, early satiety, nausea, vomiting She needs symptomatic relief of her nausea and vomiting.  Zofran is not helping but IV Reglan here in the ED, albeit only one dose thus far, has provided relief.  *   Type II DM.  Generally diet controlled.     PLAN:     *   ?  Trial of scheduled oral metoclopramide to see if it will help with her chronic nausea and vomiting.  *  EUS.  This will need to be arranged and generally is done in outpt setting.     Azucena Freed  07/12/2017, 3:58 PM Pager: [phone]

## 2017-07-12 NOTE — H&P (Signed)
History and Physical    Debra Leach WUJ:[id number] DOB: 1950-06-14 DOA: 07/12/2017  PCP: [name], MD   Patient coming from: Home  I have personally briefly reviewed patient's old medical records in [place]  Chief Complaint: Nausea, vomiting and poor oral intake  HPI: Debra Leach is a 68 y.o. female with medical history significant of hyperlipidemia, GERD, mitral valve prolapse, diabetes mellitus type 2 probably diet-controlled, migraines presented with nausea, vomiting and very poor oral intake.  Patient states that she has had several month history of nausea and decreased appetite which has worsened over the past 5 weeks.  She has not been able to keep much down for the past week, including fluids.  She has lost around 15 pounds of weight in the last 5 weeks.  Patient was seen by her gastroenterologist last week who had ordered CT with pancreatic protocol suspecting pancreatic neoplasm.  But because of her worsening symptoms including worsening vomiting, she was advised to come to the emergency room by her primary care provider.  She complains of intermittent fever but no night sweats.  She denies significant abdominal pain, constipation, jaundice, diarrhea, shortness of breath, cough, dysuria, loss of consciousness or seizures.  ED Course: She was given intravenous fluids.  CT scan of the abdomen showed mass in the pancreas.  Hospitalist service was called to evaluate the patient.  Review of Systems: As per HPI otherwise 10 point review of systems negative.    Past Medical History:  Diagnosis Date  . Arthritis    osteoarthritis B knees, left hip , right elbow  . GERD (gastroesophageal reflux disease)   . Hyperlipidemia   . MVP (mitral valve prolapse)    history of    Past Surgical History:  Procedure Laterality Date  . arm surgery    . BREAST BIOPSY Left   . BREAST SURGERY  2000   breast biopsy (benign)  . LITHOTRIPSY  08-12-2008   stent placed  bilaterally  . PARTIAL HYSTERECTOMY     Both ovaries remain, vaginal, for mennorhagia  . TONSILLECTOMY    . TUBAL LIGATION  1980   Social history  reports that she has quit smoking. she has never used smokeless tobacco. She reports that she drinks alcohol. She reports that she does not use drugs.  Lives at home with her husband  Allergies  Allergen Reactions  . Atenolol   . Codeine     REACTION: Migraine  . Penicillins     REACTION: Itching  . Pentazocine Lactate     REACTION: Swelling, itching, rash  . Crestor [Rosuvastatin] Other (See Comments)    Family History  Problem Relation Age of Onset  . Cancer Mother        bone  . Hypertension Father   . Mitral valve prolapse Father   . Asthma Brother   . Arthritis Brother   . Nephrolithiasis Brother   . Nephrolithiasis Brother   . Asthma Brother   . Arthritis Brother   . Aortic aneurysm Brother        ascending aortic aneuysm  . Breast cancer Maternal Aunt   . Breast cancer Maternal Aunt     Prior to Admission medications   Medication Sig Start Date End Date Taking? Authorizing Provider  atorvastatin (LIPITOR) 80 MG tablet Take 1 tablet (80 mg total) by mouth daily. 03/01/17   Bedsole, Amy E, MD  Blood Glucose Monitoring Suppl (ONETOUCH VERIO FLEX SYSTEM) w/Device KIT 1 each by Does not  apply route See admin instructions. Check blood sugar daily.  Dx: E11.9 12/11/16   [name], MD  cholecalciferol (VITAMIN D) 1000 units tablet Take 1,000 Units by mouth daily.    [provider]  clotrimazole-betamethasone (LOTRISONE) cream APPLY TO AFFECTED AREA(S) AS NEEDED 03/01/17   Diona Browner, Amy E, MD  colesevelam (WELCHOL) 625 MG tablet Take 1,875 mg by mouth 2 (two) times daily with a meal.    [provider]  Erenumab-aooe (AIMOVIG 140 DOSE) 70 MG/ML SOAJ Inject 140 mg into the skin every 30 (thirty) days. 06/13/17   Pieter Partridge, DO  gabapentin (NEURONTIN) 300 MG capsule TAKE TWO CAPSULES BY MOUTH TWO TIMES A DAY  05/09/17   Tomi Likens, Adam R, DO  meclizine (ANTIVERT) 25 MG tablet Take 25-50 mg by mouth as needed for dizziness.    [provider]  omeprazole (PRILOSEC) 20 MG capsule Take 1 capsule (20 mg total) by mouth 2 (two) times daily before a meal. 05/24/17   Bedsole, Amy E, MD  ondansetron (ZOFRAN) 4 MG tablet Take 1 tablet (4 mg total) by mouth every 8 (eight) hours as needed for nausea or vomiting. 05/09/17   Tomi Likens, Adam R, DO  ONETOUCH DELICA LANCETS 90Z MISC Check blood sugar daily.  Dx: E11.9 05/24/17   [name], MD  [facility] VERIO test strip Check blood sugar daily.  Dx: E11.9 05/24/17   [name], MD  SUMAtriptan (IMITREX) 6 MG/0.5ML SOLN injection Inject 0.5 mLs (6 mg total) into the skin every 2 (two) hours as needed for migraine (May repeast dose once after 2 hrs if needed, not to exceed 2 doses in 24 hours). 05/09/17   Pieter Partridge, DO  Syringe/Needle, Disp, (SYRINGE LUER LOCK) 25G X 5/8" 3 ML MISC Use as needed 09/16/16   Pieter Partridge, DO    Physical Exam: Vitals:   07/12/17 0712 07/12/17 0713 07/12/17 1052 07/12/17 1526  BP: 114/69  129/68 137/60  Pulse: (!) 105  93 (!) 101  Resp: '16  18 16  ' Temp: 98.4 F (36.9 C)  98.1 F (36.7 C)   TempSrc: Oral  Oral   SpO2: 97%  96% 98%  Weight:  59.9 kg (132 lb)    Height:  5' (1.524 m)      Constitutional: Very thinly built female lying in bed, NAD, calm, comfortable Vitals:   07/12/17 0712 07/12/17 0713 07/12/17 1052 07/12/17 1526  BP: 114/69  129/68 137/60  Pulse: (!) 105  93 (!) 101  Resp: '16  18 16  ' Temp: 98.4 F (36.9 C)  98.1 F (36.7 C)   TempSrc: Oral  Oral   SpO2: 97%  96% 98%  Weight:  59.9 kg (132 lb)    Height:  5' (1.524 m)     Eyes: PERRL, lids and conjunctivae normal.  No icterus ENMT: Mucous membranes are dry. Posterior pharynx clear of any exudate or lesions. Neck: normal, supple, no masses, no thyromegaly Respiratory: Bilateral decreased breath sounds at bases.  No wheezing.  No accessory  muscle use.  Cardiovascular: S1-S2 positive, rate controlled.  No murmurs.  No pedal edema Abdomen: no tenderness, no masses palpated. No hepatosplenomegaly. Bowel sounds positive.  Musculoskeletal: no clubbing / cyanosis. No joint deformity upper and lower extremities. Skin: no rashes, lesions, ulcers. No induration Neurologic: CN 2-12 grossly intact.  Alert, awake and oriented, moving extremities.   Psychiatric: Normal judgment and insight. Alert and oriented x 3. Normal mood.    Labs  on Admission: I have personally reviewed following labs and imaging studies  CBC: Recent Labs  Lab 07/08/17 1624 07/12/17 0727  WBC 10.9* 11.0*  NEUTROABS 8,284*  --   HGB 12.0 11.4*  HCT 34.5* 34.7*  MCV 87.6 92.0  PLT 442* 580*   Basic Metabolic Panel: Recent Labs  Lab 07/12/17 0727  NA 136  K 3.5  CL 100*  CO2 21*  GLUCOSE 163*  BUN 7  CREATININE 1.08*  CALCIUM 8.8*   GFR: Estimated Creatinine Clearance: 41.5 mL/min (A) (by C-G formula based on SCr of 1.08 mg/dL (H)). Liver Function Tests: Recent Labs  Lab 07/12/17 0727  AST 17  ALT 15  ALKPHOS 93  BILITOT 0.4  PROT 8.1  ALBUMIN 2.4*   Recent Labs  Lab 07/12/17 0727  LIPASE 78*   No results for input(s): AMMONIA in the last 168 hours. Coagulation Profile: No results for input(s): INR, PROTIME in the last 168 hours. Cardiac Enzymes: No results for input(s): CKTOTAL, CKMB, CKMBINDEX, TROPONINI in the last 168 hours. BNP (last 3 results) No results for input(s): PROBNP in the last 8760 hours. HbA1C: No results for input(s): HGBA1C in the last 72 hours. CBG: No results for input(s): GLUCAP in the last 168 hours. Lipid Profile: No results for input(s): CHOL, HDL, LDLCALC, TRIG, CHOLHDL, LDLDIRECT in the last 72 hours. Thyroid Function Tests: No results for input(s): TSH, T4TOTAL, FREET4, T3FREE, THYROIDAB in the last 72 hours. Anemia Panel: No results for input(s): VITAMINB12, FOLATE, FERRITIN, TIBC, IRON, RETICCTPCT  in the last 72 hours. Urine analysis:    Component Value Date/Time   COLORURINE STRAW (A) 07/12/2017 1300   APPEARANCEUR CLEAR 07/12/2017 1300   LABSPEC 1.002 (L) 07/12/2017 1300   PHURINE 6.0 07/12/2017 1300   GLUCOSEU NEGATIVE 07/12/2017 1300   HGBUR NEGATIVE 07/12/2017 1300   BILIRUBINUR NEGATIVE 07/12/2017 1300   KETONESUR NEGATIVE 07/12/2017 1300   PROTEINUR NEGATIVE 07/12/2017 1300   NITRITE NEGATIVE 07/12/2017 1300   LEUKOCYTESUR SMALL (A) 07/12/2017 1300    Radiological Exams on Admission: Ct Angio Abdomen W And/or Wo Contrast  Result Date: 07/12/2017 CLINICAL DATA:  Evaluate for pancreas mass. EXAM: CT ANGIOGRAPHY ABDOMEN TECHNIQUE: Multidetector CT imaging of the abdomen was performed using the standard protocol during bolus administration of intravenous contrast. Multiplanar reconstructed images and MIPs were obtained and reviewed to evaluate the vascular anatomy. CONTRAST:  <See Chart> ISOVUE-300 IOPAMIDOL (ISOVUE-300) INJECTION 61% COMPARISON:  None FINDINGS: VASCULAR Aorta: Mild aortic atherosclerosis.  No aneurysm. Celiac: The celiac trunk appears patent and uninvolved by mass. SMA: The superior mesenteric artery is also patent and uninvolved.T Renals: Both renal arteries are patent without evidence of aneurysm, dissection, vasculitis, fibromuscular dysplasia or significant stenosis. IMA: Patent without evidence of aneurysm, dissection, vasculitis or significant stenosis. Inflow: Patent without evidence of aneurysm, dissection, vasculitis or significant stenosis. Veins: No obvious venous abnormality within the limitations of this arterial phase study. The portal venous confluence and the portal vein appear patent and uninvolved by pancreas mass. There may be contact of the superior mesenteric vein with the pancreas head mass, image 45 of series 8, but no encasement. Review of the MIP images confirms the above findings. NON-VASCULAR Lower chest: Perifissural nodule within the right  mid lung measures 8 mm, image 3 of series 4. Nodular areas areas of subpleural consolidation noted within both lung bases posteriorly. The largest overlies the posteromedial left lower lobe measuring 1.9 cm, image 11 of series 4. Hepatobiliary: No focal liver abnormality identified. Gallbladder normal.  No biliary dilatation. Pancreas: Hypoenhancing soft tissue mass involving the head and uncinate process of the pancreas is identified. This measures 3.1 by 1.9 by 4.0 cm, image 53 of series 4 and image 48 of series 8. There is no main duct dilatation. The common bile duct does not appear obstructed. Spleen: Normal appearance of the spleen. Adrenals/Urinary Tract: Normal adrenal glands. Multiple bilateral hypoenhancing kidney masses are identified. The largest is in the upper pole of the left kidney measuring 5.2 cm, image 31 of series 6. In the right kidney there is a mass measuring 4.0 cm. No hydronephrosis identified. Stomach/Bowel: Small hiatal hernia. No abnormal dilatation of the visualized small or large bowel loops. Lymphatic: No pathologically enlarged lymph nodes identified within the upper abdomen. Other: There is no ascites.  No peritoneal nodularity identified. Musculoskeletal: No suspicious bone lesions. IMPRESSION: 1. There is a mass involving the head of pancreas and uncinate process which is suspicious for primary pancreatic adenocarcinoma. Further evaluation with endoscopic ultrasound and tissue sampling is advised. No upper abdominal adenopathy, liver metastasis or evidence of involvement of the celiac artery, superior mesenteric artery, or portal vein. 2. Multiple bilateral kidney lesions are identified. In the setting of pancreatic adenocarcinoma findings are suspicious for metastatic disease. Other differential considerations include lymphoma, multiple renal cell carcinomas or multifocal urothelial carcinoma (though this is considered less likely.) 3. Nodular areas of subpleural consolidation  within the lung bases likely postinflammatory. Cannot rule out metastatic disease. Electronically Signed   By: Kerby Moors M.D.   On: 07/12/2017 14:51     Assessment/Plan Active Problems:   Hyperlipidemia   Abnormal weight loss   Pancreatic mass   Dehydration   New diagnosis of mass involving the head of pancreas and uncinate process suspicious for primary pancreatic adenocarcinoma with multiple bilateral kidney lesions -Spoke to gastroenterology on call/Dr. Havery Moros on phone.  He will see the patient in consultation.  Patient will need endoscopic ultrasound at some point  Severe dehydration with nausea and vomiting -Probably secondary to above.  Continue IV fluids -N.p.o. for now  Poor oral intake and weight loss -Probably second to above.  Nutrition consult  Mild leukocytosis -Probably reactive.  We will monitor  Thrombocytosis -Probably reactive.  We will monitor  Normocytic anemia -Probably secondary to new diagnosis of cancer.  No signs of bleeding.  Will monitor  Diabetes mellitus type 2 -Not on oral medications at home.  Hemoglobin A1c.  Accu-Cheks with coverage  Dyslipidemia -Resume home medication once able to tolerate orally   DVT prophylaxis: Lovenox Code Status: Full Family Communication: Spoke to husband at bedside  disposition Plan: Home in 1-2 days Consults called: Gastroenterology Admission status: Observation  Severity of Illness: The appropriate patient status for this patient is OBSERVATION. Observation status is judged to be reasonable and necessary in order to provide the required intensity of service to ensure the patient's safety. The patient's presenting symptoms, physical exam findings, and initial radiographic and laboratory data in the context of their medical condition is felt to place them at decreased risk for further clinical deterioration. Furthermore, it is anticipated that the patient will be medically stable for discharge from the  hospital within 2 midnights of admission. The following factors support the patient status of observation.   " The patient's presenting symptoms include nausea and vomiting. " The physical exam findings include dehydration. " The initial radiographic and laboratory data are new diagnosis of pancreatic mass.      Aline August MD [facility] Pager  [phone]  If 7PM-7AM, please contact night-coverage [email or web address] Password [email or web address]  07/12/2017, 3:56 PM

## 2017-07-12 NOTE — Care Management Obs Status (Signed)
Plain NOTIFICATION   Patient Details  Name: Debra Leach MRN: [id number] Date of Birth: 1950-09-06   Medicare Observation Status Notification Given:  Yes    Apolonio Schneiders, RN 07/12/2017, 7:42 PM

## 2017-07-12 NOTE — ED Notes (Signed)
Attempted report 

## 2017-07-13 ENCOUNTER — Other Ambulatory Visit: Payer: Self-pay

## 2017-07-13 ENCOUNTER — Encounter (HOSPITAL_COMMUNITY): Payer: Self-pay | Admitting: General Practice

## 2017-07-13 DIAGNOSIS — E785 Hyperlipidemia, unspecified: Secondary | ICD-10-CM | POA: Diagnosis present

## 2017-07-13 DIAGNOSIS — K869 Disease of pancreas, unspecified: Secondary | ICD-10-CM | POA: Diagnosis not present

## 2017-07-13 DIAGNOSIS — Z808 Family history of malignant neoplasm of other organs or systems: Secondary | ICD-10-CM | POA: Diagnosis not present

## 2017-07-13 DIAGNOSIS — Z888 Allergy status to other drugs, medicaments and biological substances status: Secondary | ICD-10-CM | POA: Diagnosis not present

## 2017-07-13 DIAGNOSIS — I341 Nonrheumatic mitral (valve) prolapse: Secondary | ICD-10-CM | POA: Diagnosis present

## 2017-07-13 DIAGNOSIS — M170 Bilateral primary osteoarthritis of knee: Secondary | ICD-10-CM | POA: Diagnosis present

## 2017-07-13 DIAGNOSIS — Z803 Family history of malignant neoplasm of breast: Secondary | ICD-10-CM | POA: Diagnosis not present

## 2017-07-13 DIAGNOSIS — M19021 Primary osteoarthritis, right elbow: Secondary | ICD-10-CM | POA: Diagnosis present

## 2017-07-13 DIAGNOSIS — Z8249 Family history of ischemic heart disease and other diseases of the circulatory system: Secondary | ICD-10-CM | POA: Diagnosis not present

## 2017-07-13 DIAGNOSIS — E860 Dehydration: Secondary | ICD-10-CM | POA: Diagnosis present

## 2017-07-13 DIAGNOSIS — G43909 Migraine, unspecified, not intractable, without status migrainosus: Secondary | ICD-10-CM | POA: Diagnosis present

## 2017-07-13 DIAGNOSIS — R7989 Other specified abnormal findings of blood chemistry: Secondary | ICD-10-CM | POA: Diagnosis present

## 2017-07-13 DIAGNOSIS — Z825 Family history of asthma and other chronic lower respiratory diseases: Secondary | ICD-10-CM | POA: Diagnosis not present

## 2017-07-13 DIAGNOSIS — K219 Gastro-esophageal reflux disease without esophagitis: Secondary | ICD-10-CM | POA: Diagnosis present

## 2017-07-13 DIAGNOSIS — R634 Abnormal weight loss: Secondary | ICD-10-CM | POA: Diagnosis not present

## 2017-07-13 DIAGNOSIS — D630 Anemia in neoplastic disease: Secondary | ICD-10-CM | POA: Diagnosis present

## 2017-07-13 DIAGNOSIS — Z79899 Other long term (current) drug therapy: Secondary | ICD-10-CM | POA: Diagnosis not present

## 2017-07-13 DIAGNOSIS — Z880 Allergy status to penicillin: Secondary | ICD-10-CM | POA: Diagnosis not present

## 2017-07-13 DIAGNOSIS — Z90711 Acquired absence of uterus with remaining cervical stump: Secondary | ICD-10-CM | POA: Diagnosis not present

## 2017-07-13 DIAGNOSIS — R112 Nausea with vomiting, unspecified: Secondary | ICD-10-CM | POA: Diagnosis not present

## 2017-07-13 DIAGNOSIS — E119 Type 2 diabetes mellitus without complications: Secondary | ICD-10-CM | POA: Diagnosis present

## 2017-07-13 DIAGNOSIS — C259 Malignant neoplasm of pancreas, unspecified: Secondary | ICD-10-CM | POA: Diagnosis present

## 2017-07-13 DIAGNOSIS — Z8261 Family history of arthritis: Secondary | ICD-10-CM | POA: Diagnosis not present

## 2017-07-13 DIAGNOSIS — D72829 Elevated white blood cell count, unspecified: Secondary | ICD-10-CM | POA: Diagnosis present

## 2017-07-13 DIAGNOSIS — M1612 Unilateral primary osteoarthritis, left hip: Secondary | ICD-10-CM | POA: Diagnosis present

## 2017-07-13 DIAGNOSIS — Z87891 Personal history of nicotine dependence: Secondary | ICD-10-CM | POA: Diagnosis not present

## 2017-07-13 DIAGNOSIS — Z885 Allergy status to narcotic agent status: Secondary | ICD-10-CM | POA: Diagnosis not present

## 2017-07-13 LAB — PROTIME-INR
INR: 1.16
Prothrombin Time: 14.7 seconds (ref 11.4–15.2)

## 2017-07-13 LAB — CBC
HCT: 34.1 % — ABNORMAL LOW (ref 36.0–46.0)
Hemoglobin: 10.8 g/dL — ABNORMAL LOW (ref 12.0–15.0)
MCH: 29.3 pg (ref 26.0–34.0)
MCHC: 31.7 g/dL (ref 30.0–36.0)
MCV: 92.4 fL (ref 78.0–100.0)
Platelets: 632 10*3/uL — ABNORMAL HIGH (ref 150–400)
RBC: 3.69 MIL/uL — ABNORMAL LOW (ref 3.87–5.11)
RDW: 14 % (ref 11.5–15.5)
WBC: 9.6 10*3/uL (ref 4.0–10.5)

## 2017-07-13 LAB — COMPREHENSIVE METABOLIC PANEL
ALT: 12 U/L — ABNORMAL LOW (ref 14–54)
AST: 20 U/L (ref 15–41)
Albumin: 2.1 g/dL — ABNORMAL LOW (ref 3.5–5.0)
Alkaline Phosphatase: 84 U/L (ref 38–126)
Anion gap: 13 (ref 5–15)
BUN: 6 mg/dL (ref 6–20)
CO2: 24 mmol/L (ref 22–32)
Calcium: 8.7 mg/dL — ABNORMAL LOW (ref 8.9–10.3)
Chloride: 108 mmol/L (ref 101–111)
Creatinine, Ser: 1.02 mg/dL — ABNORMAL HIGH (ref 0.44–1.00)
GFR calc Af Amer: >60 mL/min (ref >60)
GFR calc non Af Amer: 56 mL/min — ABNORMAL LOW (ref >60)
Glucose, Bld: 93 mg/dL (ref 65–99)
Potassium: 4.2 mmol/L (ref 3.5–5.1)
Sodium: 145 mmol/L (ref 135–145)
Total Bilirubin: 0.9 mg/dL (ref 0.3–1.2)
Total Protein: 7.4 g/dL (ref 6.5–8.1)

## 2017-07-13 LAB — GLUCOSE, CAPILLARY
Glucose-Capillary: 105 mg/dL — ABNORMAL HIGH (ref 65–99)
Glucose-Capillary: 154 mg/dL — ABNORMAL HIGH (ref 65–99)
Glucose-Capillary: 126 mg/dL — ABNORMAL HIGH (ref 65–99)

## 2017-07-13 LAB — HEMOGLOBIN A1C
Hgb A1c MFr Bld: 6.7 % — ABNORMAL HIGH (ref 4.8–5.6)
Mean Plasma Glucose: 145.59 mg/dL

## 2017-07-13 MED ORDER — GABAPENTIN 600 MG PO TABS
600.0000 mg | ORAL_TABLET | Freq: Two times a day (BID) | ORAL | Status: DC
  Administered 2017-07-14: 600 mg via ORAL
  Filled 2017-07-13 (×4): qty 1

## 2017-07-13 MED ORDER — PROMETHAZINE HCL 25 MG PO TABS
25.0000 mg | ORAL_TABLET | ORAL | Status: DC | PRN
  Administered 2017-07-13 – 2017-07-14 (×5): 25 mg via ORAL
  Filled 2017-07-13 (×5): qty 1

## 2017-07-13 MED ORDER — PROMETHAZINE HCL 25 MG PO TABS: 12.5000 mg | ORAL_TABLET | ORAL | Status: DC | PRN

## 2017-07-13 MED ORDER — SODIUM CHLORIDE 0.9 % IV SOLN
INTRAVENOUS | Status: DC
  Administered 2017-07-13 – 2017-07-14 (×3): via INTRAVENOUS

## 2017-07-13 NOTE — Progress Notes (Signed)
Patient ate 1/2 container of jello and 5oz Apple juice and She wasnt able to keep it down.  PO antiemtics increased, will continue to monitor.

## 2017-07-13 NOTE — Progress Notes (Signed)
PROGRESS NOTE    Debra Leach  IDP:[id number] DOB: 10/12/1950 DOA: 07/12/2017 PCP: [name], MD   Outpatient Specialists:     Brief Narrative:  Debra Leach is a 67 y.o. female with medical history significant of hyperlipidemia, GERD, mitral valve prolapse, diabetes mellitus type 2 probably diet-controlled, migraines presented with nausea, vomiting and very poor oral intake.  Patient states that she has had several month history of nausea and decreased appetite which has worsened over the past 5 weeks.  She has not been able to keep much down for the past week, including fluids.  She has lost around 15 pounds of weight in the last 5 weeks.  Patient was seen by her gastroenterologist last week who had ordered CT with pancreatic protocol suspecting pancreatic neoplasm.  But because of her worsening symptoms including worsening vomiting, she was advised to come to the emergency room by her primary care provider.  She complains of intermittent fever but no night sweats.  She denies significant abdominal pain, constipation, jaundice, diarrhea, shortness of breath, cough, dysuria, loss of consciousness or seizures.     Assessment & Plan:   Active Problems:   Hyperlipidemia   Abnormal weight loss   Pancreatic mass   Dehydration   New diagnosis of mass involving the head of pancreas and uncinate process suspicious for primary pancreatic adenocarcinoma with multiple bilateral kidney lesions -seen by GI -needs outpatient EUS-FNA-- I called patient's GI Dr. Earlean Shawl-- He is arranging outpatient follow up on Friday in his office and eventual EUS[facility]FNA at [facility]  Severe dehydration with nausea and vomiting -IVF -clears-- patient has NUMEROUS food intolerances so this is an issue  Poor oral intake and weight loss -due to suspected cancer most likely  Mild leukocytosis -Probably reactive.  We will monitor  Thrombocytosis -Probably reactive.  We will monitor  Normocytic  anemia -Probably secondary to new diagnosis of cancer.  No signs of bleeding.  Will monitor  Diabetes mellitus type 2 -Not on oral medications at home.  Hemoglobin A1c: 6.7.  Accu-Cheks with coverage  Dyslipidemia -Resume home medication once able to tolerate orally     DVT prophylaxis:  Lovenox   Code Status: Full Code   Family Communication:   Disposition Plan:    Consultants:   GI   Subjective: Did not get food last PM -unable to eat our food due to allergies/intolerance Still with nausea  Objective: Vitals:   07/12/17 2015 07/12/17 2052 07/13/17 0516 07/13/17 1455  BP: 117/63 (!) 142/64 (!) 133/58 (!) 122/59  Pulse: 92 100 (!) 110 92  Resp:  18 17 20   Temp:  98.2 F (36.8 C) 98.6 F (37 C) 99.3 F (37.4 C)  TempSrc:  Oral  Oral  SpO2: 95% 97% 95% 99%  Weight:  62.2 kg (137 lb 2 oz)    Height:  5' (1.524 m)      Intake/Output Summary (Last 24 hours) at 07/13/2017 1628 Last data filed at 07/13/2017 0945 Gross per 24 hour  Intake 1285.42 ml  Output 2500 ml  Net -1214.58 ml   Filed Weights   07/12/17 0713 07/12/17 2052  Weight: 59.9 kg (132 lb) 62.2 kg (137 lb 2 oz)    Examination:  General exam: in bed, ill appearing  Respiratory system: no increase work of breathing Cardiovascular system: rrr Central nervous system: Alert and oriented. No focal neurological deficits. Extremities: Symmetric 5 x 5 power. Skin: No rashes, lesions or ulcers Psychiatry: Judgement and insight appear normal. Mood &  affect appropriate.     Data Reviewed: I have personally reviewed following labs and imaging studies  CBC: Recent Labs  Lab 07/08/17 1624 07/12/17 0727 07/12/17 2040 07/13/17 0358  WBC 10.9* 11.0* 10.2 9.6  NEUTROABS 8,284*  --   --   --   HGB 12.0 11.4* 10.4* 10.8*  HCT 34.5* 34.7* 31.9* 34.1*  MCV 87.6 92.0 91.7 92.4  PLT 442* 589* 572* 789*   Basic Metabolic Panel: Recent Labs  Lab 07/12/17 0727 07/12/17 2040 07/13/17 0358  NA  136  --  145  K 3.5  --  4.2  CL 100*  --  108  CO2 21*  --  24  GLUCOSE 163*  --  93  BUN 7  --  6  CREATININE 1.08* 1.02* 1.02*  CALCIUM 8.8*  --  8.7*   GFR: Estimated Creatinine Clearance: 44.7 mL/min (A) (by C-G formula based on SCr of 1.02 mg/dL (H)). Liver Function Tests: Recent Labs  Lab 07/12/17 0727 07/13/17 0358  AST 17 20  ALT 15 12*  ALKPHOS 93 84  BILITOT 0.4 0.9  PROT 8.1 7.4  ALBUMIN 2.4* 2.1*   Recent Labs  Lab 07/12/17 0727  LIPASE 78*   No results for input(s): AMMONIA in the last 168 hours. Coagulation Profile: Recent Labs  Lab 07/13/17 0358  INR 1.16   Cardiac Enzymes: No results for input(s): CKTOTAL, CKMB, CKMBINDEX, TROPONINI in the last 168 hours. BNP (last 3 results) No results for input(s): PROBNP in the last 8760 hours. HbA1C: Recent Labs    07/13/17 0358  HGBA1C 6.7*   CBG: Recent Labs  Lab 07/12/17 2050 07/13/17 0757 07/13/17 1143  GLUCAP 117* 105* 154*   Lipid Profile: No results for input(s): CHOL, HDL, LDLCALC, TRIG, CHOLHDL, LDLDIRECT in the last 72 hours. Thyroid Function Tests: No results for input(s): TSH, T4TOTAL, FREET4, T3FREE, THYROIDAB in the last 72 hours. Anemia Panel: No results for input(s): VITAMINB12, FOLATE, FERRITIN, TIBC, IRON, RETICCTPCT in the last 72 hours. Urine analysis:    Component Value Date/Time   COLORURINE STRAW (A) 07/12/2017 1300   APPEARANCEUR CLEAR 07/12/2017 1300   LABSPEC 1.002 (L) 07/12/2017 1300   PHURINE 6.0 07/12/2017 1300   GLUCOSEU NEGATIVE 07/12/2017 1300   HGBUR NEGATIVE 07/12/2017 1300   BILIRUBINUR NEGATIVE 07/12/2017 1300   KETONESUR NEGATIVE 07/12/2017 1300   PROTEINUR NEGATIVE 07/12/2017 1300   NITRITE NEGATIVE 07/12/2017 1300   LEUKOCYTESUR SMALL (A) 07/12/2017 1300     )No results found for this or any previous visit (from the past 240 hour(s)).    Anti-infectives (From admission, onward)   None       Radiology Studies: Ct Angio Abdomen W And/or Wo  Contrast  Result Date: 07/12/2017 CLINICAL DATA:  Evaluate for pancreas mass. EXAM: CT ANGIOGRAPHY ABDOMEN TECHNIQUE: Multidetector CT imaging of the abdomen was performed using the standard protocol during bolus administration of intravenous contrast. Multiplanar reconstructed images and MIPs were obtained and reviewed to evaluate the vascular anatomy. CONTRAST:  <See Chart> ISOVUE-300 IOPAMIDOL (ISOVUE-300) INJECTION 61% COMPARISON:  None FINDINGS: VASCULAR Aorta: Mild aortic atherosclerosis.  No aneurysm. Celiac: The celiac trunk appears patent and uninvolved by mass. SMA: The superior mesenteric artery is also patent and uninvolved.T Renals: Both renal arteries are patent without evidence of aneurysm, dissection, vasculitis, fibromuscular dysplasia or significant stenosis. IMA: Patent without evidence of aneurysm, dissection, vasculitis or significant stenosis. Inflow: Patent without evidence of aneurysm, dissection, vasculitis or significant stenosis. Veins: No obvious venous abnormality within the  limitations of this arterial phase study. The portal venous confluence and the portal vein appear patent and uninvolved by pancreas mass. There may be contact of the superior mesenteric vein with the pancreas head mass, image 45 of series 8, but no encasement. Review of the MIP images confirms the above findings. NON-VASCULAR Lower chest: Perifissural nodule within the right mid lung measures 8 mm, image 3 of series 4. Nodular areas areas of subpleural consolidation noted within both lung bases posteriorly. The largest overlies the posteromedial left lower lobe measuring 1.9 cm, image 11 of series 4. Hepatobiliary: No focal liver abnormality identified. Gallbladder normal. No biliary dilatation. Pancreas: Hypoenhancing soft tissue mass involving the head and uncinate process of the pancreas is identified. This measures 3.1 by 1.9 by 4.0 cm, image 53 of series 4 and image 48 of series 8. There is no main duct  dilatation. The common bile duct does not appear obstructed. Spleen: Normal appearance of the spleen. Adrenals/Urinary Tract: Normal adrenal glands. Multiple bilateral hypoenhancing kidney masses are identified. The largest is in the upper pole of the left kidney measuring 5.2 cm, image 31 of series 6. In the right kidney there is a mass measuring 4.0 cm. No hydronephrosis identified. Stomach/Bowel: Small hiatal hernia. No abnormal dilatation of the visualized small or large bowel loops. Lymphatic: No pathologically enlarged lymph nodes identified within the upper abdomen. Other: There is no ascites.  No peritoneal nodularity identified. Musculoskeletal: No suspicious bone lesions. IMPRESSION: 1. There is a mass involving the head of pancreas and uncinate process which is suspicious for primary pancreatic adenocarcinoma. Further evaluation with endoscopic ultrasound and tissue sampling is advised. No upper abdominal adenopathy, liver metastasis or evidence of involvement of the celiac artery, superior mesenteric artery, or portal vein. 2. Multiple bilateral kidney lesions are identified. In the setting of pancreatic adenocarcinoma findings are suspicious for metastatic disease. Other differential considerations include lymphoma, multiple renal cell carcinomas or multifocal urothelial carcinoma (though this is considered less likely.) 3. Nodular areas of subpleural consolidation within the lung bases likely postinflammatory. Cannot rule out metastatic disease. Electronically Signed   By: Kerby Moors M.D.   On: 07/12/2017 14:51        Scheduled Meds: . enoxaparin (LOVENOX) injection  40 mg Subcutaneous Q24H  . gabapentin  600 mg Oral BID  . insulin aspart  0-5 Units Subcutaneous QHS  . insulin aspart  0-9 Units Subcutaneous TID WC  . pantoprazole  40 mg Oral Daily   Continuous Infusions: . sodium chloride       LOS: 0 days    Time spent: 35 min    Geradine Girt, DO [facility] Pager [phone]  If 7PM-7AM, please contact night-coverage [email or web address] Password [facility] 07/13/2017, 4:28 PM

## 2017-07-13 NOTE — Progress Notes (Signed)
Patient is at bedside with water and stated that she has been giving permission per GI doctor [place]. SHe was advise of NPO status however still continues to drink.

## 2017-07-13 NOTE — Progress Notes (Signed)
Patient is not tolerating her diet well with alternating the phenergan and Zofran. Will continue to monitor. No vomiting but severe nausea present insipte of antiemetic on board.

## 2017-07-14 ENCOUNTER — Telehealth: Payer: Self-pay

## 2017-07-14 DIAGNOSIS — E860 Dehydration: Secondary | ICD-10-CM

## 2017-07-14 DIAGNOSIS — K869 Disease of pancreas, unspecified: Secondary | ICD-10-CM

## 2017-07-14 DIAGNOSIS — R634 Abnormal weight loss: Secondary | ICD-10-CM

## 2017-07-14 LAB — GLUCOSE, CAPILLARY
Glucose-Capillary: 105 mg/dL — ABNORMAL HIGH (ref 65–99)
Glucose-Capillary: 84 mg/dL (ref 65–99)
Glucose-Capillary: 80 mg/dL (ref 65–99)
Glucose-Capillary: 84 mg/dL (ref 65–99)
Glucose-Capillary: 117 mg/dL — ABNORMAL HIGH (ref 65–99)

## 2017-07-14 LAB — CBC
HCT: 34.8 % — ABNORMAL LOW (ref 36.0–46.0)
Hemoglobin: 11.1 g/dL — ABNORMAL LOW (ref 12.0–15.0)
MCH: 29.8 pg (ref 26.0–34.0)
MCHC: 31.9 g/dL (ref 30.0–36.0)
MCV: 93.3 fL (ref 78.0–100.0)
Platelets: 606 10*3/uL — ABNORMAL HIGH (ref 150–400)
RBC: 3.73 MIL/uL — ABNORMAL LOW (ref 3.87–5.11)
RDW: 14.3 % (ref 11.5–15.5)
WBC: 7.7 10*3/uL (ref 4.0–10.5)

## 2017-07-14 LAB — BASIC METABOLIC PANEL
Anion gap: 13 (ref 5–15)
BUN: <5 mg/dL — ABNORMAL LOW (ref 6–20)
CO2: 23 mmol/L (ref 22–32)
Calcium: 8.4 mg/dL — ABNORMAL LOW (ref 8.9–10.3)
Chloride: 112 mmol/L — ABNORMAL HIGH (ref 101–111)
Creatinine, Ser: 0.96 mg/dL (ref 0.44–1.00)
GFR calc Af Amer: >60 mL/min (ref >60)
GFR calc non Af Amer: >60 mL/min (ref >60)
Glucose, Bld: 97 mg/dL (ref 65–99)
Potassium: 3.3 mmol/L — ABNORMAL LOW (ref 3.5–5.1)
Sodium: 148 mmol/L — ABNORMAL HIGH (ref 135–145)

## 2017-07-14 MED ORDER — KCL IN DEXTROSE-NACL 40-5-0.45 MEQ/L-%-% IV SOLN
INTRAVENOUS | Status: DC
  Administered 2017-07-14 – 2017-07-15 (×2): via INTRAVENOUS
  Filled 2017-07-14 (×3): qty 1000

## 2017-07-14 MED ORDER — POTASSIUM CHLORIDE IN NACL 40-0.9 MEQ/L-% IV SOLN
INTRAVENOUS | Status: DC
  Filled 2017-07-14: qty 1000

## 2017-07-14 MED ORDER — PROMETHAZINE HCL 25 MG/ML IJ SOLN
12.5000 mg | INTRAMUSCULAR | Status: DC | PRN
  Administered 2017-07-14 – 2017-07-15 (×3): 12.5 mg via INTRAVENOUS
  Filled 2017-07-14 (×3): qty 1

## 2017-07-14 MED ORDER — POTASSIUM CHLORIDE CRYS ER 20 MEQ PO TBCR
40.0000 meq | EXTENDED_RELEASE_TABLET | Freq: Once | ORAL | Status: AC
  Administered 2017-07-14: 40 meq via ORAL
  Filled 2017-07-14: qty 2

## 2017-07-14 NOTE — Progress Notes (Signed)
PROGRESS NOTE  Debra Leach NWG:[id number] DOB: Sep 04, 1950 DOA: 07/12/2017 PCP: [name], MD  HPI/Recap of past 24 hours: Debra Busing Forrestis a 67 y.o.femalewith medical history significant ofhyperlipidemia, GERD, mitral valve prolapse, diabetes mellitus type 2 probably diet-controlled, migraines presented with nausea, vomiting and very poor oral intake. Patient states that she has had several month history of nausea and decreased appetite which has worsened over the past 5 weeks. She has not been able to keep much down for the past week, including fluids. She has lost around 15 pounds of weight in the last 5 weeks. Patient was seen by her gastroenterologist last week who had ordered CT with pancreatic protocol suspecting pancreatic neoplasm. But because of her worsening symptoms including worsening vomiting, she was advised to come to the emergency room by her primary care provider. She complains of intermittent fever but no night sweats. She denies significant abdominal pain, constipation, jaundice, diarrhea, shortness of breath, cough, dysuria, loss of consciousness or seizures.  Today, pt reported still having nausea with vomiting, still not able to keep anything down. Poor appetite with multiple food reactions (worsens her migraine attack) and also early satiety. Pt denies any chest pain, SOB, abdominal pain, fever/chills.   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 Patient's GI Dr. Earlean Shawl on board, appt for Friday  Severe dehydration with nausea and vomiting, poor oral intake/weightloss Likely due to above, still unable to keep anything down Improving on IVF Advanced diet to soft with  careful selection as patient has NUMEROUS food intolerance Nutrition on board  Thrombocytosis Probably reactive Monitor closely  Normocytic anemia Probably secondary to new diagnosis of cancer No signs of bleeding, monitor closely  Diabetes mellitus type 2 A1c 6.7 Not on oral medications at home Accu-Cheks with coverage    Code Status: Full  Family Communication: None at bedside  Disposition Plan: Plan for d/c 07/15/17   Consultants:  GI  Procedures:  None  Antimicrobials:  None  DVT prophylaxis: Lovenox   Objective: Vitals:   07/13/17 1455 07/13/17 2140 07/14/17 0601 07/14/17 1411  BP: (!) 122/59 (!) 120/57 139/71 135/71  Pulse: 92 (!) 105 100 91  Resp: 20 18 12 20   Temp: 99.3 F (37.4 C) 99.2 F (37.3 C) (!) 97.5 F (36.4 C) (!) 97.5 F (36.4 C)  TempSrc: Oral Oral Oral Axillary  SpO2: 99% 96% 99% 99%  Weight:      Height:        Intake/Output Summary (Last 24 hours) at 07/14/2017 1739 Last data filed at 07/14/2017 1525 Gross per 24 hour  Intake 2134.99 ml  Output 1550 ml  Net 584.99 ml   Filed Weights   07/12/17 0713 07/12/17 2052  Weight: 59.9 kg (132 lb) 62.2 kg (137 lb 2 oz)    Exam:   General: Alert, awake, oriented  Cardiovascular: S1, S2 present, no added hrt sound  Respiratory: Chest clear bilaterally  Abdomen: Soft, non-tender, non-distended, BS present  Musculoskeletal: No pedal edema bilaterally  Skin: Normal  Psychiatry: Normal mood   Data Reviewed: CBC: Recent Labs  Lab 07/08/17 1624 07/12/17 0727 07/12/17 2040 07/13/17 0358 07/14/17 0303  WBC 10.9* 11.0* 10.2 9.6 7.7  NEUTROABS 8,284*  --   --   --   --  HGB 12.0 11.4* 10.4* 10.8* 11.1*  HCT 34.5* 34.7* 31.9* 34.1* 34.8*  MCV 87.6 92.0 91.7 92.4 93.3  PLT 442* 589* 572* 632* 292*   Basic Metabolic Panel: Recent Labs  Lab 07/12/17 0727 07/12/17 2040 07/13/17 0358 07/14/17 0303  NA 136  --  145 148*  K 3.5  --  4.2 3.3*  CL 100*  --  108 112*    CO2 21*  --  24 23  GLUCOSE 163*  --  93 97  BUN 7  --  6 <5*  CREATININE 1.08* 1.02* 1.02* 0.96  CALCIUM 8.8*  --  8.7* 8.4*   GFR: Estimated Creatinine Clearance: 47.5 mL/min (by C-G formula based on SCr of 0.96 mg/dL). Liver Function Tests: Recent Labs  Lab 07/12/17 0727 07/13/17 0358  AST 17 20  ALT 15 12*  ALKPHOS 93 84  BILITOT 0.4 0.9  PROT 8.1 7.4  ALBUMIN 2.4* 2.1*   Recent Labs  Lab 07/12/17 0727  LIPASE 78*   No results for input(s): AMMONIA in the last 168 hours. Coagulation Profile: Recent Labs  Lab 07/13/17 0358  INR 1.16   Cardiac Enzymes: No results for input(s): CKTOTAL, CKMB, CKMBINDEX, TROPONINI in the last 168 hours. BNP (last 3 results) No results for input(s): PROBNP in the last 8760 hours. HbA1C: Recent Labs    07/13/17 0358  HGBA1C 6.7*   CBG: Recent Labs  Lab 07/13/17 1644 07/13/17 2142 07/14/17 0807 07/14/17 1227 07/14/17 1628  GLUCAP 126* 105* 84 80 84   Lipid Profile: No results for input(s): CHOL, HDL, LDLCALC, TRIG, CHOLHDL, LDLDIRECT in the last 72 hours. Thyroid Function Tests: No results for input(s): TSH, T4TOTAL, FREET4, T3FREE, THYROIDAB in the last 72 hours. Anemia Panel: No results for input(s): VITAMINB12, FOLATE, FERRITIN, TIBC, IRON, RETICCTPCT in the last 72 hours. Urine analysis:    Component Value Date/Time   COLORURINE STRAW (A) 07/12/2017 1300   APPEARANCEUR CLEAR 07/12/2017 1300   LABSPEC 1.002 (L) 07/12/2017 1300   PHURINE 6.0 07/12/2017 1300   GLUCOSEU NEGATIVE 07/12/2017 1300   HGBUR NEGATIVE 07/12/2017 1300   BILIRUBINUR NEGATIVE 07/12/2017 1300   KETONESUR NEGATIVE 07/12/2017 1300   PROTEINUR NEGATIVE 07/12/2017 1300   NITRITE NEGATIVE 07/12/2017 1300   LEUKOCYTESUR SMALL (A) 07/12/2017 1300   Sepsis Labs: @LABRCNTIP (procalcitonin:4,lacticidven:4)  )No results found for this or any previous visit (from the past 240 hour(s)).    Studies: No results found.  Scheduled Meds: .  enoxaparin (LOVENOX) injection  40 mg Subcutaneous Q24H  . gabapentin  600 mg Oral BID  . insulin aspart  0-5 Units Subcutaneous QHS  . insulin aspart  0-9 Units Subcutaneous TID WC  . pantoprazole  40 mg Oral Daily    Continuous Infusions: . dextrose 5 % and 0.45 % NaCl with KCl 40 mEq/L       LOS: 1 day     Alma Friendly, MD [facility]   If 7PM-7AM, please contact night-coverage [email or web address] Password [facility] 07/14/2017, 5:39 PM

## 2017-07-14 NOTE — Progress Notes (Signed)
Initial Nutrition Assessment  DOCUMENTATION CODES:   Not applicable  INTERVENTION:   Ordered specific fruit for patient's trays: Cantaloupe, Apples, Peaches, Bananas and Pears  Monitor PO intake.  Consider nutrition support if unable to meet needs after 7-10 days.   NUTRITION DIAGNOSIS:   Inadequate oral intake related to nausea, vomiting, poor appetite as evidenced by percent weight loss, per patient/family report, energy intake < 75% for > or equal to 1 month.  GOAL:   Patient will meet greater than or equal to 90% of their needs  MONITOR:   PO intake, I & O's, Labs, Weight trends, Skin, Diet advancement  REASON FOR ASSESSMENT:   Malnutrition Screening Tool    ASSESSMENT:   Debra Leach is a 67 y.o. female with medical history significant of hyperlipidemia, GERD, mitral valve prolapse, diabetes mellitus type 2 probably diet-controlled, migraines presented with nausea, vomiting and very poor oral intake, found with pancreatic mass on CT scan  Spoke with Debra Leach and her husband at bedside. She has been struggling over the past 5 weeks to eat much of anything. Only eating a little bit, "a few ounces a day." She has many food sensitivities due to chronic migraines, which worsens her food choices on top of the nausea and vomiting she presented with. She reports losing 15 pounds over those 5 weeks, a 15 pound/10% severe weight loss. Appears per chart she was 145 pounds 5 weeks ago, an 8 pound/5.5% insignificant weight loss for timeframe.  Ordered specific meals for patient and encouraged her to order other things in addition to them ad libitum.   Labs reviewed:  Na 148, K+ 3.3 Medications reviewed and include:  Insulin NS at 151mL/hr   NUTRITION - FOCUSED PHYSICAL EXAM:    Most Recent Value  Orbital Region  No depletion  Upper Arm Region  No depletion  Thoracic and Lumbar Region  No depletion  Buccal Region  Mild depletion  Temple Region  No depletion   Clavicle Bone Region  No depletion  Clavicle and Acromion Bone Region  No depletion  Scapular Bone Region  No depletion  Dorsal Hand  No depletion  Patellar Region  No depletion  Anterior Thigh Region  No depletion  Posterior Calf Region  No depletion  Edema (RD Assessment)  None  Hair  Reviewed  Eyes  Reviewed  Mouth  Reviewed  Skin  Reviewed  Nails  Reviewed       Diet Order:  DIET SOFT Room service appropriate? Yes; Fluid consistency: Thin  EDUCATION NEEDS:   Education needs have been addressed  Skin:  Skin Assessment: Reviewed RN Assessment  Last BM:  07/13/2017  Height:   Ht Readings from Last 1 Encounters:  07/12/17 5' (1.524 m)    Weight:   Wt Readings from Last 1 Encounters:  07/12/17 137 lb 2 oz (62.2 kg)    Ideal Body Weight:  45.45 kg  BMI:  Body mass index is 26.78 kg/m.  Estimated Nutritional Needs:   Kcal:  1800-2100 calories  Protein:  81-106 grams  Fluid:  1.8-2.1L  Satira Anis. Sherma Vanmetre, MS, RD LDN Inpatient Clinical Dietitian Pager [phone]

## 2017-07-14 NOTE — Telephone Encounter (Signed)
Talked with pt in detail. She does not need any medication at this time.

## 2017-07-14 NOTE — Progress Notes (Signed)
Pt. Did not want gabapentin this morning this evening pt stated they wanted their gabapentin Dr. Horris Latino notified and stated pt could have bed time gabapentin early

## 2017-07-14 NOTE — Telephone Encounter (Signed)
Agree with  rectal suppository or Zofran meltable on tounge for transition if not provided by  Hospital MD I have never prescribed injection of phenergan as outpt in prescription form.

## 2017-07-14 NOTE — Telephone Encounter (Signed)
[facility] called; pt is presently in [facility] hospital with huge mass of pancreas.pt is presently getting IV phenergan and as soon as can keep food down the plan is to discharge pt to go to [facility] to have mass removed. Pt will vomit oral phenergan and request injectable phenergan for time between [facility] hospital discharge and [facility] admission (not sure how may days will take between the 2 hospitals). I advised to talk with the doctor at the hospital and [facility] said hospital doctor declined to order injectable phenergan and pt hoped Dr Diona Browner would prescribe.(pts husband injects migraine med and would be able to give injectable phenergan). Dr Diona Browner was with pt in clinic and I advised Larene Beach to advise pt to ck with hospital doctor to see what he was going to recommend for N & V between transition period of [facility] to [facility]; if pt can keep food down may be able to keep oral med down or possible suppository. Larene Beach will convey info to pt. Will send note as FYI to Dr Diona Browner.

## 2017-07-15 LAB — BASIC METABOLIC PANEL
Anion gap: 12 (ref 5–15)
BUN: <5 mg/dL — ABNORMAL LOW (ref 6–20)
CO2: 21 mmol/L — ABNORMAL LOW (ref 22–32)
Calcium: 8.6 mg/dL — ABNORMAL LOW (ref 8.9–10.3)
Chloride: 114 mmol/L — ABNORMAL HIGH (ref 101–111)
Creatinine, Ser: 1 mg/dL (ref 0.44–1.00)
GFR calc Af Amer: >60 mL/min (ref >60)
GFR calc non Af Amer: 57 mL/min — ABNORMAL LOW (ref >60)
Glucose, Bld: 128 mg/dL — ABNORMAL HIGH (ref 65–99)
Potassium: 3.6 mmol/L (ref 3.5–5.1)
Sodium: 147 mmol/L — ABNORMAL HIGH (ref 135–145)

## 2017-07-15 LAB — GLUCOSE, CAPILLARY
Glucose-Capillary: 138 mg/dL — ABNORMAL HIGH (ref 65–99)
Glucose-Capillary: 111 mg/dL — ABNORMAL HIGH (ref 65–99)

## 2017-07-15 MED ORDER — PROMETHAZINE HCL 12.5 MG RE SUPP: 12.5000 mg | Freq: Four times a day (QID) | RECTAL | 0 refills | Status: DC | PRN

## 2017-07-15 MED ORDER — PROMETHAZINE HCL 12.5 MG PO TABS: 12.5000 mg | ORAL_TABLET | Freq: Four times a day (QID) | ORAL | 0 refills | Status: DC | PRN

## 2017-07-15 NOTE — Discharge Summary (Addendum)
Discharge Summary  Debra Leach GNF:[id number] DOB: 1951-01-04  PCP: [name], MD  Admit date: 07/12/2017 Discharge date: 07/15/2017  Time spent: > 30 mins  Recommendations for Outpatient Follow-up:  1. PCP 2. GI  Discharge Diagnoses:  Active Hospital Problems   Diagnosis Date Noted  . Pancreatic mass 07/12/2017  . Dehydration 07/12/2017  . Abnormal weight loss 07/06/2017  . Hyperlipidemia 07/05/2008    Resolved Hospital Problems  No resolved problems to display.    Discharge Condition: Stable  Diet recommendation: Heart healthy  Vitals:   07/15/17 0557 07/15/17 1353  BP: (!) 146/66 (!) 132/59  Pulse: 94 90  Resp: 18 18  Temp: 98.6 F (37 C) 97.9 F (36.6 C)  SpO2: 95% 96%    History of present illness:  Debra Leach a 67 y.o.femalewith medical history significant ofhyperlipidemia, GERD, mitral valve prolapse, diabetes mellitus type 2 probably diet-controlled, migraines presented with nausea, vomiting and very poor oral intake. Patient states that she has had several month history of nausea and decreased appetite which has worsened over the past 5 weeks. She has not been able to keep much down for the past week, including fluids. She has lost around 15 pounds of weight in the last 5 weeks. Patient was seen by her gastroenterologist last week who had ordered CT with pancreatic protocol suspecting pancreatic neoplasm. But because of her worsening symptoms including worsening vomiting, she was advised to come to the emergency room by her primary care provider. She complains of intermittent fever but no night sweats. She denies significant abdominal pain, constipation, jaundice, diarrhea, shortness of breath, cough, dysuria, loss of consciousness or seizures.  Today, patient was able to tolerate orally and keep it down.  Denied any new complaints.  Patient stable for discharge with follow-up with PCP and GI.  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 Patient's GI Dr. Earlean Shawl on board  Severe dehydration with nausea and vomiting, poor oral intake/weightloss Likely due to above, improved Advanced diet to soft with careful selection as patient has NUMEROUS food intolerance Discharge patient on p.o. and suppository promethazine, as that worked better for patient Patient advised to use the suppository promethazine only if the p.o. cannot be tolerated and should not combine both.  Thrombocytosis Probably reactive PCP to follow up  Normocytic anemia Probably secondary to new diagnosis of cancer No signs of bleeding, monitor closely PCP to follow up  Diabetes mellitus type 2 A1c 6.7 Not on oral medications at home PCP to follow up    Procedures:  None  Consultations:  GI  Discharge Exam: BP (!) 132/59 (BP Location: Right Arm) Comment: rn present  Pulse 90   Temp 97.9 F (36.6 C) (Axillary)   Resp 18   Ht 5' (1.524 m)   Wt 62.2 kg (137 lb 2 oz)   SpO2 96%   BMI 26.78 kg/m   General: Alert, awake, oriented Cardiovascular: S1, S2 present, no added heart sound Respiratory: Chest clear bilaterally Abdomen soft, nontender, nondistended, bowel sounds present  Discharge Instructions You were cared for by a hospitalist during your hospital stay. If you have any questions about your discharge medications or the care you received while you were in the hospital after you are  discharged, you can call the unit and asked to speak with the hospitalist on call if the hospitalist that took care of you is not available. Once you are discharged, your primary care physician will handle any further medical issues. Please note that NO REFILLS for any  discharge medications will be authorized once you are discharged, as it is imperative that you return to your primary care physician (or establish a relationship with a primary care physician if you do not have one) for your aftercare needs so that they can reassess your need for medications and monitor your lab values.   Allergies as of 07/15/2017      Reactions   Atenolol    Codeine    REACTION: Migraine   Penicillins    REACTION: Itching   Pentazocine Lactate    REACTION: Swelling, itching, rash   Crestor [rosuvastatin] Other (See Comments)   Sudafed [pseudoephedrine Hcl] Itching, Anxiety      Medication List    TAKE these medications   atorvastatin 80 MG tablet Commonly known as:  LIPITOR Take 1 tablet (80 mg total) by mouth daily.   cholecalciferol 1000 units tablet Commonly known as:  VITAMIN D Take 1,000 Units by mouth daily.   clotrimazole-betamethasone cream Commonly known as:  LOTRISONE APPLY TO AFFECTED AREA(S) AS NEEDED   colesevelam 625 MG tablet Commonly known as:  WELCHOL Take 1,875 mg by mouth 2 (two) times daily with a meal.   Erenumab-aooe 70 MG/ML Soaj Commonly known as:  AIMOVIG 140 DOSE Inject 140 mg into the skin every 30 (thirty) days.   gabapentin 300 MG capsule Commonly known as:  NEURONTIN TAKE TWO CAPSULES BY MOUTH TWO TIMES A DAY   meclizine 25 MG tablet Commonly known as:  ANTIVERT Take 25-50 mg by mouth as needed for dizziness.   omeprazole 20 MG capsule Commonly known as:  PRILOSEC Take 1 capsule (20 mg total) by mouth 2 (two) times daily before a meal.   ondansetron 4 MG tablet Commonly known as:  ZOFRAN Take 1 tablet (4 mg total) by mouth every 8 (eight) hours as needed for nausea or vomiting.   ONETOUCH DELICA LANCETS 82N Misc Check blood sugar daily.  Dx: E11.9   ONETOUCH VERIO FLEX SYSTEM w/Device Kit 1 each by Does not apply route See admin instructions. Check blood sugar daily.  Dx: E11.9   ONETOUCH VERIO test  strip Generic drug:  glucose blood Check blood sugar daily.  Dx: E11.9   promethazine 12.5 MG suppository Commonly known as:  PHENERGAN Place 1 suppository (12.5 mg total) rectally every 6 (six) hours as needed for nausea or vomiting. Please dont use together with the tablet   promethazine 12.5 MG tablet Commonly known as:  PHENERGAN Take 1 tablet (12.5 mg total) by mouth every 6 (six) hours as needed for nausea or vomiting.   SOOTHE 0.6-0.6 % Soln Generic drug:  Propylene Glycol-Glycerin Apply 1 drop to eye as needed.   SUMAtriptan 6 MG/0.5ML Soln injection Commonly known as:  IMITREX Inject 0.5 mLs (6 mg total) into the skin every 2 (two) hours as needed for migraine (May repeast dose once after 2 hrs if needed, not to exceed 2 doses in 24 hours).   Syringe Luer Lock 25G X 5/8" 3 ML Misc Use as needed      Allergies  Allergen Reactions  . Atenolol   . Codeine     REACTION: Migraine  . Penicillins     REACTION: Itching  . Pentazocine  Lactate     REACTION: Swelling, itching, rash  . Crestor [Rosuvastatin] Other (See Comments)  . Sudafed [Pseudoephedrine Hcl] Itching and Anxiety   Follow-up Information    Bedsole, Amy E, MD. Schedule an appointment as soon as possible for a visit in 1 week(s).   Specialty:  Family Medicine Contact information: [place] [address] [phone]        Richmond Campbell, MD. Call in 1 week(s).   Specialty:  Gastroenterology Contact information: [place] [address] [phone]            The results of significant diagnostics from this hospitalization (including imaging, microbiology, ancillary and laboratory) are listed below for reference.    Significant Diagnostic Studies: Ct Angio Abdomen W And/or Wo Contrast  Result Date: 07/12/2017 CLINICAL DATA:  Evaluate for pancreas mass. EXAM: CT ANGIOGRAPHY ABDOMEN TECHNIQUE: Multidetector CT imaging of the abdomen was performed using the  standard protocol during bolus administration of intravenous contrast. Multiplanar reconstructed images and MIPs were obtained and reviewed to evaluate the vascular anatomy. CONTRAST:  <See Chart> ISOVUE-300 IOPAMIDOL (ISOVUE-300) INJECTION 61% COMPARISON:  None FINDINGS: VASCULAR Aorta: Mild aortic atherosclerosis.  No aneurysm. Celiac: The celiac trunk appears patent and uninvolved by mass. SMA: The superior mesenteric artery is also patent and uninvolved.T Renals: Both renal arteries are patent without evidence of aneurysm, dissection, vasculitis, fibromuscular dysplasia or significant stenosis. IMA: Patent without evidence of aneurysm, dissection, vasculitis or significant stenosis. Inflow: Patent without evidence of aneurysm, dissection, vasculitis or significant stenosis. Veins: No obvious venous abnormality within the limitations of this arterial phase study. The portal venous confluence and the portal vein appear patent and uninvolved by pancreas mass. There may be contact of the superior mesenteric vein with the pancreas head mass, image 45 of series 8, but no encasement. Review of the MIP images confirms the above findings. NON-VASCULAR Lower chest: Perifissural nodule within the right mid lung measures 8 mm, image 3 of series 4. Nodular areas areas of subpleural consolidation noted within both lung bases posteriorly. The largest overlies the posteromedial left lower lobe measuring 1.9 cm, image 11 of series 4. Hepatobiliary: No focal liver abnormality identified. Gallbladder normal. No biliary dilatation. Pancreas: Hypoenhancing soft tissue mass involving the head and uncinate process of the pancreas is identified. This measures 3.1 by 1.9 by 4.0 cm, image 53 of series 4 and image 48 of series 8. There is no main duct dilatation. The common bile duct does not appear obstructed. Spleen: Normal appearance of the spleen. Adrenals/Urinary Tract: Normal adrenal glands. Multiple bilateral hypoenhancing kidney  masses are identified. The largest is in the upper pole of the left kidney measuring 5.2 cm, image 31 of series 6. In the right kidney there is a mass measuring 4.0 cm. No hydronephrosis identified. Stomach/Bowel: Small hiatal hernia. No abnormal dilatation of the visualized small or large bowel loops. Lymphatic: No pathologically enlarged lymph nodes identified within the upper abdomen. Other: There is no ascites.  No peritoneal nodularity identified. Musculoskeletal: No suspicious bone lesions. IMPRESSION: 1. There is a mass involving the head of pancreas and uncinate process which is suspicious for primary pancreatic adenocarcinoma. Further evaluation with endoscopic ultrasound and tissue sampling is advised. No upper abdominal adenopathy, liver metastasis or evidence of involvement of the celiac artery, superior mesenteric artery, or portal vein. 2. Multiple bilateral kidney lesions are identified. In the setting of pancreatic adenocarcinoma findings are suspicious for metastatic disease. Other differential considerations include lymphoma, multiple  renal cell carcinomas or multifocal urothelial carcinoma (though this is considered less likely.) 3. Nodular areas of subpleural consolidation within the lung bases likely postinflammatory. Cannot rule out metastatic disease. Electronically Signed   By: Kerby Moors M.D.   On: 07/12/2017 14:51    Microbiology: No results found for this or any previous visit (from the past 240 hour(s)).   Labs: Basic Metabolic Panel: Recent Labs  Lab 07/12/17 0727 07/12/17 2040 07/13/17 0358 07/14/17 0303 07/15/17 0442  NA 136  --  145 148* 147*  K 3.5  --  4.2 3.3* 3.6  CL 100*  --  108 112* 114*  CO2 21*  --  24 23 21*  GLUCOSE 163*  --  93 97 128*  BUN 7  --  6 <5* <5*  CREATININE 1.08* 1.02* 1.02* 0.96 1.00  CALCIUM 8.8*  --  8.7* 8.4* 8.6*   Liver Function Tests: Recent Labs  Lab 07/12/17 0727 07/13/17 0358  AST 17 20  ALT 15 12*  ALKPHOS 93 84    BILITOT 0.4 0.9  PROT 8.1 7.4  ALBUMIN 2.4* 2.1*   Recent Labs  Lab 07/12/17 0727  LIPASE 78*   No results for input(s): AMMONIA in the last 168 hours. CBC: Recent Labs  Lab 07/12/17 0727 07/12/17 2040 07/13/17 0358 07/14/17 0303  WBC 11.0* 10.2 9.6 7.7  HGB 11.4* 10.4* 10.8* 11.1*  HCT 34.7* 31.9* 34.1* 34.8*  MCV 92.0 91.7 92.4 93.3  PLT 589* 572* 632* 606*   Cardiac Enzymes: No results for input(s): CKTOTAL, CKMB, CKMBINDEX, TROPONINI in the last 168 hours. BNP: BNP (last 3 results) No results for input(s): BNP in the last 8760 hours.  ProBNP (last 3 results) No results for input(s): PROBNP in the last 8760 hours.  CBG: Recent Labs  Lab 07/14/17 1227 07/14/17 1628 07/14/17 2211 07/15/17 0738 07/15/17 1148  GLUCAP 80 84 117* 138* 111*       Signed:  Alma Friendly, MD [facility] 07/15/2017, 7:46 PM

## 2017-07-15 NOTE — Progress Notes (Signed)
Beckie Busing Milillo to be D/C'd to home per MD order.  Discussed with the patient and all questions fully answered.  VSS, Skin clean, dry and intact without evidence of skin break down, no evidence of skin tears noted. IV catheter discontinued intact. Site without signs and symptoms of complications. Dressing and pressure applied.  An After Visit Summary was printed and given to the patient. Patient received prescription.  D/c education completed with patient/family including follow up instructions, medication list, d/c activities limitations if indicated, with other d/c instructions as indicated by MD - patient able to verbalize understanding, all questions fully answered.   Patient instructed to return to ED, call 911, or call MD for any changes in condition.   Patient escorted via [place], and D/C home via private auto.  Lynann Beaver 07/15/2017 4:33 PM

## 2017-07-18 ENCOUNTER — Telehealth: Payer: Self-pay

## 2017-07-18 ENCOUNTER — Telehealth: Payer: Self-pay | Admitting: Family Medicine

## 2017-07-18 MED ORDER — OSELTAMIVIR PHOSPHATE 75 MG PO CAPS: 75.0000 mg | ORAL_CAPSULE | Freq: Every day | ORAL | 0 refills | Status: DC

## 2017-07-18 NOTE — Telephone Encounter (Signed)
Patient with flu A and she has ongoing pancreatic cancer work-up. Will send in prophylactic Tamiflu.

## 2017-07-18 NOTE — Telephone Encounter (Signed)
Dr. Diona Browner please advise:  Patient has been in close contact with our office since her discharge.  Due to declining health, pending transfer to [facility] and acute Flu infection (currently being treated with Tamiflu), unless patient requesting to come in, should patient come in office this week to see you and have TCM hosp f/u?  I will call and check in with patient regardless but wanted to know if I should set up her hospital follow up as well.  I am confused by the notes stating "pending transfer to [facility]".    Thanks.

## 2017-07-19 ENCOUNTER — Other Ambulatory Visit: Payer: Self-pay

## 2017-07-19 ENCOUNTER — Encounter (HOSPITAL_COMMUNITY): Payer: Self-pay

## 2017-07-19 ENCOUNTER — Emergency Department (HOSPITAL_COMMUNITY)
Admission: EM | Admit: 2017-07-19 | Discharge: 2017-07-19 | Disposition: A | Discharge status: Home / Self Care | Payer: Medicare Other | Attending: Emergency Medicine | Admitting: Emergency Medicine

## 2017-07-19 DIAGNOSIS — K869 Disease of pancreas, unspecified: Secondary | ICD-10-CM | POA: Insufficient documentation

## 2017-07-19 DIAGNOSIS — E119 Type 2 diabetes mellitus without complications: Secondary | ICD-10-CM | POA: Insufficient documentation

## 2017-07-19 DIAGNOSIS — Z79899 Other long term (current) drug therapy: Secondary | ICD-10-CM | POA: Diagnosis not present

## 2017-07-19 DIAGNOSIS — R531 Weakness: Secondary | ICD-10-CM | POA: Diagnosis not present

## 2017-07-19 DIAGNOSIS — K8689 Other specified diseases of pancreas: Secondary | ICD-10-CM

## 2017-07-19 DIAGNOSIS — Z87891 Personal history of nicotine dependence: Secondary | ICD-10-CM | POA: Insufficient documentation

## 2017-07-19 DIAGNOSIS — E860 Dehydration: Secondary | ICD-10-CM | POA: Insufficient documentation

## 2017-07-19 DIAGNOSIS — E876 Hypokalemia: Secondary | ICD-10-CM | POA: Diagnosis not present

## 2017-07-19 DIAGNOSIS — R112 Nausea with vomiting, unspecified: Secondary | ICD-10-CM | POA: Diagnosis present

## 2017-07-19 LAB — COMPREHENSIVE METABOLIC PANEL
ALT: 11 U/L — ABNORMAL LOW (ref 14–54)
AST: 20 U/L (ref 15–41)
Albumin: 2.1 g/dL — ABNORMAL LOW (ref 3.5–5.0)
Alkaline Phosphatase: 51 U/L (ref 38–126)
Anion gap: 10 (ref 5–15)
BUN: 5 mg/dL — ABNORMAL LOW (ref 6–20)
CO2: 22 mmol/L (ref 22–32)
Calcium: 7.5 mg/dL — ABNORMAL LOW (ref 8.9–10.3)
Chloride: 114 mmol/L — ABNORMAL HIGH (ref 101–111)
Creatinine, Ser: 0.94 mg/dL (ref 0.44–1.00)
GFR calc Af Amer: >60 mL/min (ref >60)
GFR calc non Af Amer: >60 mL/min (ref >60)
Glucose, Bld: 100 mg/dL — ABNORMAL HIGH (ref 65–99)
Potassium: 3 mmol/L — ABNORMAL LOW (ref 3.5–5.1)
Sodium: 146 mmol/L — ABNORMAL HIGH (ref 135–145)
Total Bilirubin: 0.7 mg/dL (ref 0.3–1.2)
Total Protein: 7.1 g/dL (ref 6.5–8.1)

## 2017-07-19 LAB — URINALYSIS, ROUTINE W REFLEX MICROSCOPIC
Bilirubin Urine: NEGATIVE
Glucose, UA: NEGATIVE mg/dL
Ketones, ur: NEGATIVE mg/dL
Nitrite: NEGATIVE
Protein, ur: NEGATIVE mg/dL
Specific Gravity, Urine: 1.001 — ABNORMAL LOW (ref 1.005–1.030)
pH: 7 (ref 5.0–8.0)

## 2017-07-19 LAB — LIPASE, BLOOD: Lipase: 90 U/L — ABNORMAL HIGH (ref 11–51)

## 2017-07-19 LAB — CBC WITH DIFFERENTIAL/PLATELET
Basophils Absolute: 0.1 10*3/uL (ref 0.0–0.1)
Basophils Relative: 1 %
Eosinophils Absolute: 0.3 10*3/uL (ref 0.0–0.7)
Eosinophils Relative: 2 %
HCT: 34.8 % — ABNORMAL LOW (ref 36.0–46.0)
Hemoglobin: 11.6 g/dL — ABNORMAL LOW (ref 12.0–15.0)
Lymphocytes Relative: 8 %
Lymphs Abs: 0.8 10*3/uL (ref 0.7–4.0)
MCH: 30.1 pg (ref 26.0–34.0)
MCHC: 33.3 g/dL (ref 30.0–36.0)
MCV: 90.4 fL (ref 78.0–100.0)
Monocytes Absolute: 1.2 10*3/uL — ABNORMAL HIGH (ref 0.1–1.0)
Monocytes Relative: 11 %
Neutro Abs: 8.4 10*3/uL — ABNORMAL HIGH (ref 1.7–7.7)
Neutrophils Relative %: 78 %
Platelets: 555 10*3/uL — ABNORMAL HIGH (ref 150–400)
RBC: 3.85 MIL/uL — ABNORMAL LOW (ref 3.87–5.11)
RDW: 14.8 % (ref 11.5–15.5)
WBC: 10.7 10*3/uL — ABNORMAL HIGH (ref 4.0–10.5)

## 2017-07-19 MED ORDER — PROMETHAZINE HCL 12.5 MG RE SUPP: 12.5000 mg | Freq: Four times a day (QID) | RECTAL | 0 refills | Status: DC | PRN

## 2017-07-19 MED ORDER — PROMETHAZINE HCL 25 MG/ML IJ SOLN
12.5000 mg | Freq: Once | INTRAMUSCULAR | Status: AC
  Administered 2017-07-19: 12.5 mg via INTRAVENOUS
  Filled 2017-07-19: qty 1

## 2017-07-19 MED ORDER — PROMETHAZINE HCL 25 MG/ML IJ SOLN
12.5000 mg | Freq: Once | INTRAMUSCULAR | Status: DC
  Filled 2017-07-19: qty 1

## 2017-07-19 MED ORDER — ACETAMINOPHEN 325 MG PO TABS
650.0000 mg | ORAL_TABLET | Freq: Once | ORAL | Status: AC
  Administered 2017-07-19: 650 mg via ORAL
  Filled 2017-07-19: qty 2

## 2017-07-19 MED ORDER — SODIUM CHLORIDE 0.9 % IV BOLUS (SEPSIS)
1000.0000 mL | Freq: Once | INTRAVENOUS | Status: AC
  Administered 2017-07-19: 1000 mL via INTRAVENOUS

## 2017-07-19 NOTE — ED Provider Notes (Signed)
[facility] DEPT Provider Note   CSN: [id number] Arrival date & time: 07/19/17  0846     History   Chief Complaint Chief Complaint  Patient presents with  . Nausea  . Weakness    HPI Debra Leach is a 67 y.o. female.  Pt presents to the ED today with nausea and weakness.  She has been recently diagnosed with a pancreatic mass on CT.  Her GI doctor is Dr. Earlean Shawl.  She was admitted from 2/5 to 2/8 for n/v and dehydration.  The soonest she could get a biopsy here in [place] was 3 weeks, but Dr. Earlean Shawl arranged for her to get a EUS-FNA biopsy at [facility] on 2/14.  After diagnosis, she wants all her treatment to be done here.  Pt said she's had zofran which does not help.  Phenergan tablets don't stay down.  Phenergan suppositories help, but she's almost out.  Pt denies any pain, she just feels weak.  Pt is also getting treated for the flu with tamiflu.      Past Medical History:  Diagnosis Date  . Arthritis    osteoarthritis B knees, left hip , right elbow  . Complication of anesthesia    difficult waking  . Diabetes mellitus without complication ([place])   . GERD (gastroesophageal reflux disease)   . Headache   . History of kidney stones   . Hyperlipidemia   . MVP (mitral valve prolapse)    history of  . PONV (postoperative nausea and vomiting)     Patient Active Problem List   Diagnosis Date Noted  . Pancreatic mass 07/12/2017  . Dehydration 07/12/2017  . Abnormal weight loss 07/06/2017  . Eustachian tube disorder, bilateral 12/16/2016  . Vertigo 12/16/2016  . Esophageal dysphagia 11/22/2016  . Chronic low back pain 10/07/2016  . Vision changes 08/12/2016  . Aortic atherosclerosis ([place]) 06/11/2016  . Supraclavicular fossa fullness 06/08/2016  . Family history of aortic aneurysm 08/20/2014  . Vitamin D deficiency 08/20/2014  . Diabetes mellitus with no complication ([place]) 60/09/5995  . OSTEOPENIA 11/06/2009  . Hyperlipidemia 07/05/2008   . INSOMNIA, CHRONIC 07/05/2008  . Migraine with aura 07/05/2008  . GERD 07/05/2008  . OSTEOARTHRITIS 07/05/2008  . MITRAL VALVE PROLAPSE, HX OF 07/05/2008  . NEPHROLITHIASIS, HX OF 07/05/2008    Past Surgical History:  Procedure Laterality Date  . arm surgery    . BREAST BIOPSY Left   . BREAST SURGERY  2000   breast biopsy (benign)  . LITHOTRIPSY  08-12-2008   stent placed bilaterally  . PARTIAL HYSTERECTOMY     Both ovaries remain, vaginal, for mennorhagia  . TONSILLECTOMY    . TUBAL LIGATION  1980    OB History    No data available       Home Medications    Prior to Admission medications   Medication Sig Start Date End Date Taking? Authorizing Provider  atorvastatin (LIPITOR) 80 MG tablet Take 1 tablet (80 mg total) by mouth daily. 03/01/17  Yes Bedsole, Amy E, MD  Blood Glucose Monitoring Suppl ([place]) w/Device KIT 1 each by Does not apply route See admin instructions. Check blood sugar daily.  Dx: E11.9 12/11/16  Yes Bedsole, Amy E, MD  cholecalciferol (VITAMIN D) 1000 units tablet Take 1,000 Units by mouth daily.   Yes [provider]  colesevelam (WELCHOL) 625 MG tablet Take 1,875 mg by mouth 2 (two) times daily with a meal.   Yes [provider]  Erenumab-aooe (AIMOVIG 140 DOSE) 70 MG/ML SOAJ Inject 140 mg into the skin every 30 (thirty) days. 06/13/17  Yes Jaffe, Adam R, DO  gabapentin (NEURONTIN) 300 MG capsule TAKE TWO CAPSULES BY MOUTH TWO TIMES A DAY Patient taking differently: Take 300 mg by mouth at bedtime. Pt may also take up to three times times daily as needed for migraine 05/09/17  Yes Jaffe, Adam R, DO  meclizine (ANTIVERT) 25 MG tablet Take 25-50 mg by mouth as needed for dizziness.   Yes [provider]  omeprazole (PRILOSEC) 20 MG capsule Take 1 capsule (20 mg total) by mouth 2 (two) times daily before a meal. Patient taking differently: Take 20 mg by mouth 2 (two) times daily as needed (reflux).  05/24/17  Yes  Bedsole, Amy Johnette Abraham, MD  ONETOUCH DELICA LANCETS 25D MISC Check blood sugar daily.  Dx: E11.9 05/24/17  Yes [name], MD  ONETOUCH VERIO test strip Check blood sugar daily.  Dx: E11.9 05/24/17  Yes Bedsole, Amy E, MD  oseltamivir (TAMIFLU) 75 MG capsule Take 1 capsule (75 mg total) by mouth daily. 07/18/17  Yes Copland, Frederico Hamman, MD  promethazine (PHENERGAN) 12.5 MG tablet Take 12.5 mg by mouth every 6 (six) hours as needed for nausea or vomiting.   Yes [provider]  SUMAtriptan (IMITREX) 6 MG/0.5ML SOLN injection Inject 0.5 mLs (6 mg total) into the skin every 2 (two) hours as needed for migraine (May repeast dose once after 2 hrs if needed, not to exceed 2 doses in 24 hours). 05/09/17  Yes Pieter Partridge, DO  Syringe/Needle, Disp, (SYRINGE LUER LOCK) 25G X 5/8" 3 ML MISC Use as needed 09/16/16  Yes Jaffe, Adam R, DO  clotrimazole-betamethasone (LOTRISONE) cream APPLY TO AFFECTED AREA(S) AS NEEDED Patient not taking: Reported on 07/19/2017 03/01/17   [name], MD  ondansetron (ZOFRAN) 4 MG tablet Take 1 tablet (4 mg total) by mouth every 8 (eight) hours as needed for nausea or vomiting. Patient not taking: Reported on 07/19/2017 05/09/17   Pieter Partridge, DO  promethazine (PHENERGAN) 12.5 MG suppository Place 1 suppository (12.5 mg total) rectally every 6 (six) hours as needed for nausea or vomiting. Please dont use together with the tablet 07/19/17   Isla Pence, MD    Family History Family History  Problem Relation Age of Onset  . Cancer Mother        bone  . Hypertension Father   . Mitral valve prolapse Father   . Asthma Brother   . Arthritis Brother   . Nephrolithiasis Brother   . Nephrolithiasis Brother   . Asthma Brother   . Arthritis Brother   . Aortic aneurysm Brother        ascending aortic aneuysm  . Breast cancer Maternal Aunt   . Breast cancer Maternal Aunt     Social History Social History   Tobacco Use  . Smoking status: Former Research scientist (life sciences)  . Smokeless  tobacco: Never Used  Substance Use Topics  . Alcohol use: Yes    Comment: rare  . Drug use: No     Allergies   Atenolol; Codeine; Penicillins; Pentazocine lactate; Crestor [rosuvastatin]; and Sudafed [pseudoephedrine hcl]   Review of Systems Review of Systems  Constitutional: Positive for appetite change and fatigue.  Gastrointestinal: Positive for nausea and vomiting.  All other systems reviewed and are negative.    Physical Exam Updated Vital Signs BP 138/68   Pulse 88   Temp 100.2 F (37.9 C) (Oral)  Resp 16   Ht 5' (1.524 m)   Wt 62.1 kg (137 lb)   SpO2 98%   BMI 26.76 kg/m   Physical Exam  Constitutional: She is oriented to person, place, and time. She appears well-developed and well-nourished.  HENT:  Head: Normocephalic and atraumatic.  Right Ear: External ear normal.  Left Ear: External ear normal.  Nose: Nose normal.  Mouth/Throat: Mucous membranes are dry.  Eyes: Conjunctivae and EOM are normal. Pupils are equal, round, and reactive to light.  Neck: Normal range of motion. Neck supple.  Cardiovascular: Normal rate, regular rhythm, normal heart sounds and intact distal pulses.  Pulmonary/Chest: Effort normal and breath sounds normal.  Abdominal: Soft. Bowel sounds are normal.  Musculoskeletal: Normal range of motion.  Neurological: She is alert and oriented to person, place, and time.  Skin: Skin is warm. Capillary refill takes less than 2 seconds.  Psychiatric: She has a normal mood and affect. Her behavior is normal. Judgment and thought content normal.  Nursing note and vitals reviewed.    ED Treatments / Results  Labs (all labs ordered are listed, but only abnormal results are displayed) Labs Reviewed  CBC WITH DIFFERENTIAL/PLATELET - Abnormal; Notable for the following components:      Result Value   WBC 10.7 (*)    RBC 3.85 (*)    Hemoglobin 11.6 (*)    HCT 34.8 (*)    Platelets 555 (*)    Neutro Abs 8.4 (*)    Monocytes Absolute 1.2  (*)    All other components within normal limits  URINALYSIS, ROUTINE W REFLEX MICROSCOPIC - Abnormal; Notable for the following components:   Specific Gravity, Urine 1.001 (*)    Hgb urine dipstick SMALL (*)    Leukocytes, UA LARGE (*)    Bacteria, UA RARE (*)    Squamous Epithelial / LPF 0-5 (*)    All other components within normal limits  COMPREHENSIVE METABOLIC PANEL - Abnormal; Notable for the following components:   Sodium 146 (*)    Potassium 3.0 (*)    Chloride 114 (*)    Glucose, Bld 100 (*)    BUN 5 (*)    Calcium 7.5 (*)    Albumin 2.1 (*)    ALT 11 (*)    All other components within normal limits  LIPASE, BLOOD - Abnormal; Notable for the following components:   Lipase 90 (*)    All other components within normal limits    EKG  EKG Interpretation None       Radiology No results found.  Procedures Procedures (including critical care time)  Medications Ordered in ED Medications  promethazine (PHENERGAN) injection 12.5 mg (12.5 mg Intravenous Not Given 07/19/17 1203)  promethazine (PHENERGAN) injection 12.5 mg (not administered)  sodium chloride 0.9 % bolus 1,000 mL (0 mLs Intravenous Stopped 07/19/17 1058)  acetaminophen (TYLENOL) tablet 650 mg (650 mg Oral Given 07/19/17 1058)  sodium chloride 0.9 % bolus 1,000 mL (0 mLs Intravenous Stopped 07/19/17 1204)     Initial Impression / Assessment and Plan / ED Course  I have reviewed the triage vital signs and the nursing notes.  Pertinent labs & imaging results that were available during my care of the patient were reviewed by me and considered in my medical decision making (see chart for details).    Pt is feeling much better.  She is able to tolerate po fluids.  Labs are stable. She will be d/c with rx for phenergan supp.  She  knows to get her biopsy on Thursday the 14th as scheduled.  She knows to return if worse.   Final Clinical Impressions(s) / ED Diagnoses   Final diagnoses:  Dehydration    Hypokalemia  Non-intractable vomiting with nausea, unspecified vomiting type  Pancreatic mass    ED Discharge Orders        Ordered    promethazine (PHENERGAN) 12.5 MG suppository  Every 6 hours PRN     07/19/17 1259       Isla Pence, MD 07/19/17 1301

## 2017-07-19 NOTE — ED Triage Notes (Signed)
EMS reports from home nausea and vomiting with weakness x 1 month. Failure to thrive reduced caloric intake. Possible pancreatic cancer not yet Dx.  BP 138/75 Hr 96 Resp 16 Sp02 95 RA CBG 190  20ga L forearm 538ml NS enroute

## 2017-07-19 NOTE — Telephone Encounter (Signed)
No I don't think she needs hospital follow up... Just need to make sure set up at [facility] for outpt follow up/procedure on pancreatic mass

## 2017-07-19 NOTE — ED Notes (Signed)
Bed: SU11 Expected date:  Expected time:  Means of arrival:  Comments: EMS- 67yo F, n/v, possible CA

## 2017-07-21 ENCOUNTER — Telehealth: Payer: Self-pay | Admitting: Neurology

## 2017-07-21 ENCOUNTER — Telehealth: Payer: Self-pay

## 2017-07-21 NOTE — Telephone Encounter (Signed)
Debra Leach a pharmacist from did not catch where left a VM message regarding pt's prescription for Sumatriptan and would like a call back at [phone]

## 2017-07-21 NOTE — Telephone Encounter (Signed)
Called and spoke with Colletta [place] Warehouse manager) at [facility]. Pt had sumatriptan injection Rx previously at [facility] in [place], they have now closed. Pt rqstd new Rx. Gave verbal with 5 refills.

## 2017-07-21 NOTE — Telephone Encounter (Signed)
Dr. Jaffe patient 

## 2017-07-21 NOTE — Telephone Encounter (Signed)
Copied from [place]. Topic: General - Other >> Jul 21, 2017  3:38 PM Cecelia Byars, NT wrote: Reason for CRM: Dr Jerene Pitch  from [facility] called to speak with Dr Diona Browner , concerning this patient ,his cell number is [phone]

## 2017-07-21 NOTE — Telephone Encounter (Signed)
Left v/m for Dr Jerene Pitch that Dr Diona Browner was out of office today and if Dr Jerene Pitch needs to speak with a provider today to call back at [phone] and will be able to speak with a provider that is in the office today otherwise Dr Diona Browner will be in the office on 07/22/17 and can return your call.

## 2017-07-22 NOTE — Telephone Encounter (Signed)
Call Dr. Jerene Pitch.. Pt with recent procedure.. Awaiting path.

## 2017-07-25 NOTE — Telephone Encounter (Signed)
Dr Jerene Pitch called; pathology showed mass auto immune pancreatitis; Dr Jerene Pitch wants to know if Dr Diona Browner will order lab IgG4 for pt to have drawn at [facility]. Dr Jerene Pitch thinks this is the best way to have lab drawn thru PCP. If Dr Diona Browner will order call pt to schedule lab appt. Dr Jerene Pitch can be called back if there is a problem.

## 2017-07-26 ENCOUNTER — Encounter: Payer: Self-pay | Admitting: Family Medicine

## 2017-07-26 NOTE — Telephone Encounter (Signed)
Please call Dr. Azzie Almas office. I have not ordered this before... I am willing to but want to make sure I get correct lab order.. See order placed and verify correct with Dr. Jerene Pitch.

## 2017-07-26 NOTE — Telephone Encounter (Signed)
Debra Leach,  Can you please help with this?

## 2017-07-27 NOTE — Telephone Encounter (Signed)
Immunoglobulin G Subclass 4 Test Code 5428  CPT Code(s) [phone]   Is this the correct test??

## 2017-07-27 NOTE — Telephone Encounter (Signed)
Terri I spoke with D.r Jerene Pitch this AM.. He said it is an IgG panel... As long as it has subtype 4 it will be right.  What do you think?   FYI to [employer].

## 2017-07-28 ENCOUNTER — Other Ambulatory Visit (INDEPENDENT_AMBULATORY_CARE_PROVIDER_SITE_OTHER): Payer: Medicare Other

## 2017-07-28 DIAGNOSIS — K861 Other chronic pancreatitis: Secondary | ICD-10-CM | POA: Diagnosis not present

## 2017-07-28 NOTE — Telephone Encounter (Signed)
That looks good. Have pt come in to check.

## 2017-07-28 NOTE — Telephone Encounter (Signed)
Lab appointment scheduled today at 2:00 pm.

## 2017-07-28 NOTE — Telephone Encounter (Signed)
Patient has been followed up with.  Please refer to ongoing 07/28/17 telephone encounter and patient email Thanks.

## 2017-07-31 ENCOUNTER — Encounter: Payer: Self-pay | Admitting: Family Medicine

## 2017-08-04 LAB — IGG 4: IgG, Subclass 4: 1113 mg/dL — ABNORMAL HIGH (ref 2–96)

## 2017-08-11 ENCOUNTER — Other Ambulatory Visit: Payer: Medicare Other

## 2017-08-12 ENCOUNTER — Other Ambulatory Visit: Payer: Medicare Other

## 2017-08-15 ENCOUNTER — Ambulatory Visit: Payer: Self-pay | Admitting: Neurology

## 2017-08-16 ENCOUNTER — Encounter: Payer: Self-pay | Admitting: Family Medicine

## 2017-08-16 ENCOUNTER — Ambulatory Visit (INDEPENDENT_AMBULATORY_CARE_PROVIDER_SITE_OTHER): Payer: Medicare Other | Admitting: Family Medicine

## 2017-08-16 VITALS — BP 140/64 | HR 78 | Temp 97.8°F | Ht 60.0 in | Wt 123.0 lb

## 2017-08-16 DIAGNOSIS — K869 Disease of pancreas, unspecified: Secondary | ICD-10-CM

## 2017-08-16 DIAGNOSIS — E440 Moderate protein-calorie malnutrition: Secondary | ICD-10-CM | POA: Diagnosis not present

## 2017-08-16 DIAGNOSIS — E119 Type 2 diabetes mellitus without complications: Secondary | ICD-10-CM

## 2017-08-16 DIAGNOSIS — K8689 Other specified diseases of pancreas: Secondary | ICD-10-CM

## 2017-08-16 DIAGNOSIS — K861 Other chronic pancreatitis: Secondary | ICD-10-CM | POA: Diagnosis not present

## 2017-08-16 DIAGNOSIS — E46 Unspecified protein-calorie malnutrition: Secondary | ICD-10-CM | POA: Insufficient documentation

## 2017-08-16 MED ORDER — OMEPRAZOLE 20 MG PO CPDR: 20.0000 mg | DELAYED_RELEASE_CAPSULE | Freq: Two times a day (BID) | ORAL | 3 refills | Status: DC

## 2017-08-16 MED ORDER — COLESEVELAM HCL 625 MG PO TABS: 1875.0000 mg | ORAL_TABLET | Freq: Two times a day (BID) | ORAL | 3 refills | Status: DC

## 2017-08-16 NOTE — Progress Notes (Signed)
   Subjective:    Patient ID: Debra Leach, female    DOB: 05-03-1951, 67 y.o.   MRN: [id number]  HPI  67 year old female pt here for DM follow up presents with new diagnosis of autoimmune pancreatitis causing a pancreatic mass.  Dr. Jerene Pitch..  Plan 4 weeks of prednisone.. gradual taper.  She started 40 mg 6 days ago.  She is feeling better.  She has been able to now eat solid foods. She is trying to increase protein.  She has not been able to eat well, has lost weight ( 30lbs) in last 4 months. Wt Readings from Last 3 Encounters:  08/16/17 123 lb (55.8 kg)  07/19/17 137 lb (62.1 kg)  07/12/17 137 lb 2 oz (62.2 kg)  Body mass index is 24.02 kg/m.   When she was in hospital in last month labs were done  Lab Results  Component Value Date   HGBA1C 6.7 (H) 07/13/2017    She reports FBS since starting back on prednisone 95-96.    Review of Systems  Constitutional: Positive for fatigue. Negative for fever.  HENT: Negative for ear pain.   Respiratory: Negative for cough, shortness of breath and wheezing.   Cardiovascular: Negative for chest pain.  Gastrointestinal: Positive for nausea. Negative for abdominal pain, constipation and diarrhea.       Objective:   Physical Exam  Constitutional: Vital signs are normal. She appears well-developed. She appears cachectic. She is cooperative.  Non-toxic appearance. She does not appear ill. No distress.  HENT:  Head: Normocephalic.  Right Ear: Hearing, tympanic membrane, external ear and ear canal normal. Tympanic membrane is not erythematous, not retracted and not bulging.  Left Ear: Hearing, tympanic membrane, external ear and ear canal normal. Tympanic membrane is not erythematous, not retracted and not bulging.  Nose: No mucosal edema or rhinorrhea. Right sinus exhibits no maxillary sinus tenderness and no frontal sinus tenderness. Left sinus exhibits no maxillary sinus tenderness and no frontal sinus tenderness.  Mouth/Throat:  Uvula is midline, oropharynx is clear and moist and mucous membranes are normal.  Eyes: Conjunctivae, EOM and lids are normal. Pupils are equal, round, and reactive to light. Lids are everted and swept, no foreign bodies found.  Neck: Trachea normal and normal range of motion. Neck supple. Carotid bruit is not present. No thyroid mass and no thyromegaly present.  Cardiovascular: Normal rate, regular rhythm, S1 normal, S2 normal, normal heart sounds, intact distal pulses and normal pulses. Exam reveals no gallop and no friction rub.  No murmur heard. Pulmonary/Chest: Effort normal and breath sounds normal. No tachypnea. No respiratory distress. She has no decreased breath sounds. She has no wheezes. She has no rhonchi. She has no rales.  Abdominal: Soft. Normal appearance and bowel sounds are normal. There is no tenderness.  Neurological: She is alert.  Skin: Skin is warm, dry and intact. No rash noted.  Psychiatric: Her speech is normal and behavior is normal. Judgment and thought content normal. Her mood appears not anxious. Cognition and memory are normal. She does not exhibit a depressed mood.     Diabetic foot exam: Normal inspection No skin breakdown No calluses  Normal DP pulses Normal sensation to light touch and monofilament Nails normal  Blood pressure 140/64, pulse 78, temperature 97.8 F (36.6 C), temperature source Oral, height 5' (1.524 m), weight 123 lb (55.8 kg).     Assessment & Plan:

## 2017-08-16 NOTE — Assessment & Plan Note (Signed)
On prednisone taper.

## 2017-08-16 NOTE — Assessment & Plan Note (Signed)
Good control now but will follow on prednisone taper. May need to initiate insulin, long acting.

## 2017-08-16 NOTE — Patient Instructions (Signed)
Goal FBS 80-120 and 2 hours after meals goal blood sugar < 180.  Gradually advance diet.  Give me a weekly  Update on MyChart with blood sugars.

## 2017-08-23 ENCOUNTER — Encounter: Payer: Self-pay | Admitting: Family Medicine

## 2017-08-30 ENCOUNTER — Encounter: Payer: Self-pay | Admitting: Family Medicine

## 2017-09-06 ENCOUNTER — Encounter: Payer: Self-pay | Admitting: Family Medicine

## 2017-09-13 ENCOUNTER — Encounter: Payer: Self-pay | Admitting: Family Medicine

## 2017-09-16 ENCOUNTER — Encounter: Payer: Self-pay | Admitting: Family Medicine

## 2017-09-16 ENCOUNTER — Other Ambulatory Visit: Payer: Self-pay | Admitting: Gastroenterology

## 2017-09-16 DIAGNOSIS — K861 Other chronic pancreatitis: Secondary | ICD-10-CM

## 2017-09-16 DIAGNOSIS — K8689 Other specified diseases of pancreas: Secondary | ICD-10-CM

## 2017-09-20 ENCOUNTER — Encounter: Payer: Self-pay | Admitting: Family Medicine

## 2017-09-23 ENCOUNTER — Ambulatory Visit
Admission: RE | Admit: 2017-09-23 | Discharge: 2017-09-23 | Disposition: A | Discharge status: Home / Self Care | Payer: Medicare Other | Source: Ambulatory Visit | Attending: Gastroenterology | Admitting: Gastroenterology

## 2017-09-23 DIAGNOSIS — I2510 Atherosclerotic heart disease of native coronary artery without angina pectoris: Secondary | ICD-10-CM | POA: Diagnosis not present

## 2017-09-23 DIAGNOSIS — I700 Atherosclerosis of aorta: Secondary | ICD-10-CM | POA: Insufficient documentation

## 2017-09-23 DIAGNOSIS — K8689 Other specified diseases of pancreas: Secondary | ICD-10-CM

## 2017-09-23 DIAGNOSIS — K861 Other chronic pancreatitis: Secondary | ICD-10-CM | POA: Diagnosis not present

## 2017-09-23 DIAGNOSIS — K449 Diaphragmatic hernia without obstruction or gangrene: Secondary | ICD-10-CM | POA: Diagnosis not present

## 2017-09-23 DIAGNOSIS — K760 Fatty (change of) liver, not elsewhere classified: Secondary | ICD-10-CM | POA: Diagnosis not present

## 2017-09-23 DIAGNOSIS — K869 Disease of pancreas, unspecified: Secondary | ICD-10-CM | POA: Insufficient documentation

## 2017-09-23 DIAGNOSIS — N200 Calculus of kidney: Secondary | ICD-10-CM | POA: Insufficient documentation

## 2017-09-23 DIAGNOSIS — K429 Umbilical hernia without obstruction or gangrene: Secondary | ICD-10-CM | POA: Insufficient documentation

## 2017-09-23 MED ORDER — IOPAMIDOL (ISOVUE-300) INJECTION 61%
100.0000 mL | Freq: Once | INTRAVENOUS | Status: AC | PRN
  Administered 2017-09-23: 100 mL via INTRAVENOUS

## 2017-09-27 ENCOUNTER — Encounter: Payer: Self-pay | Admitting: Family Medicine

## 2017-10-04 ENCOUNTER — Encounter: Payer: Self-pay | Admitting: Family Medicine

## 2017-10-11 ENCOUNTER — Encounter: Payer: Self-pay | Admitting: Family Medicine

## 2017-10-12 ENCOUNTER — Encounter: Payer: Self-pay | Admitting: Neurology

## 2017-10-12 ENCOUNTER — Ambulatory Visit (INDEPENDENT_AMBULATORY_CARE_PROVIDER_SITE_OTHER): Payer: Medicare Other | Admitting: Neurology

## 2017-10-12 VITALS — BP 140/82 | HR 87 | Ht 60.5 in | Wt 137.0 lb

## 2017-10-12 DIAGNOSIS — G43709 Chronic migraine without aura, not intractable, without status migrainosus: Secondary | ICD-10-CM

## 2017-10-12 NOTE — Progress Notes (Signed)
Pharmacist from [facility] called. Wanted to confirm ok to dispense to Pt the just released single injector of Aimovig 140mg , opposed to the 2 injector 70 mg the Pt was previously receiving. Advsd that was appropriate.

## 2017-10-12 NOTE — Progress Notes (Signed)
NEUROLOGY FOLLOW UP OFFICE NOTE  STEPHANIA MACFARLANE [id number]  HISTORY OF PRESENT ILLNESS: Debra Leach is a 67 year old right-handed female with chronic migraines, hyperlipidemia, diabetes, GERD, osteoarthritis, and history of MVP and nephrolithiasis who follows up for migraines.   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  She was quite sick.  She has been on a slow prednisone taper for the past 2 months and will be finishing at the end of the week.  Therefore, she cannot tell how her migraines have been doing.  They are only mild to moderate intensity, lasting from 2 to 4 hours.  She has had to use the sumatriptan about twice a week.  Current NSAIDS:  None Current steroid:  prednisone Current analgesics:  none Current triptans:  sumatriptan 1102m, sumatriptan 649mSC Current anti-emetic:  Zofran 65m28mMeclizine for dizziness. Current muscle relaxants:  none Current Antihypertensive medications:  Indapamide Current Antidepressant medications:  none Current Anticonvulsant medications:  gabapentin (usually takes as needed) Current CGRP inhibitor:  Aimovig 140m71mrrent antihistamine:  cyproheptadine 65mg 365mrent Vitamins/Herbal/Supplements:  magnesium 400mg,77moflavin, coenzyme Q10 Other therapy:  Cefaly, ice pack   Caffeine:  1 cup coffee daily (no caffeine made no difference) Smoker:  no Diet:  Follows LEAP ImmunoCalm Dietary Program Exercise:  When tolerated (due to knee problem).  She walks.  She also bikes. Depression/stress:  stable Sleep hygiene:  improved.   HISTORY: Onset:  19 yea58 old Location:  Varies (unilateral either side, frontal-temporal, back of head) Quality:  Varies (stabbing, pounding, throbbing) Initial Intensity:  10/10 severe, otherwise 5-7/10 Aura:  Occurs off and on over the years and varies in semiology.  In early 1970s, black out.  In late 1980s, white out.  In late 1990s, flashing lights and zigzag lines.  Since 2016,  scintillating scotoma (occurs 1 to 2 times a month) Prodrome:  no Associated symptoms:  Initially vomiting.  Sometimes nausea.  Photophobia, phonophobia.  Dizziness. Initial Duration:  All day Initial Frequency:  daily Triggers/exacerbating factors:  Change in weather, stress, valsalva, odors, certain foods  Relieving factors:  Ice, rest Activity:  Difficult to function if severe   Past NSAIDS:  diclofenac 50mg, 33mia, Mobic 15mg, i46mofen, naproxen, toradol  Past analgesics:  tramadol (reaction), Midrin, Excedrin Past abortive triptans/ergots:  Treximet, Axert, Amerge, Frova, Maxalt, Relpax, Zomig tablet, DHE NS Past anxiolytic:  Buspirone, clonazepam Past antihypertensive medications:  Metoprolol, Norvasc, propranolol ER 120mg, at51mol 100mg (sid60mfects dizziness, myalgias, acid reflux) Past antidepressant medications:  Venlafaxine, sertraline, amitriptyline, amoxapine, duloxetine, nortriptyline, imipramine, Luvox, Remeron, Serzone, bupropion, desipramine, doxepin, Vivactil Past anticonvulsant medications:  Depakote, Topamax, zonisamide, Lamictal, Keppra, Lyrica, gabapentin 300/300/600  Past vitamins/Herbal/Supplements:  butterbur Other past treatments:  Botox (2 years), Cefaly Device, methylergonovine, acupuncture, biofeedback, Seroquel, Thorazine, methylsergide maleate   Family history of headache:  Maybe her mother's side.   Prior brain MRI normal (date unknown but report is mentioned in prior notes).   Abnormal Movements: In December 2017, we started atenolol for migraine prevention.  It worked well, but due to reported side effects of dizziness, acid reflux and joint and muscle pain, she was tapered off of it in early April.  Around that time, she developed shaking episodes or tremors associated with her migraines.  She followed up with her PCP, Dr. Bedsole, oDiona Browner8 and we had her discontinue the gabapentin.  She hasn't had any recurrent tremors, however she says she has gone up  to 4 days at a time without  an attack.  Since discontinuing the gabapentin, the intensity of her daily headaches have gotten worse.     I reviewed the video of her habitual attacks.  In the video, she exhibits flapping of her right hand, arrhythmic and without tremor or choreiform movement.  It is not an arrhythmic jerking, consistent with myoclonus.  It sometimes involves the left hand as well.  She denies stress and anxiety.  PAST MEDICAL HISTORY: Past Medical History:  Diagnosis Date  . Arthritis    osteoarthritis B knees, left hip , right elbow  . Complication of anesthesia    difficult waking  . Diabetes mellitus without complication ([place])    Diet and exercise controlled  . GERD (gastroesophageal reflux disease)   . Headache   . History of kidney stones   . Hyperlipidemia   . MVP (mitral valve prolapse)    history of  . PONV (postoperative nausea and vomiting)     MEDICATIONS: Current Outpatient Medications on File Prior to Visit  Medication Sig Dispense Refill  . atorvastatin (LIPITOR) 80 MG tablet Take 1 tablet (80 mg total) by mouth daily. (Patient not taking: Reported on 08/16/2017) 90 tablet 3  . Blood Glucose Monitoring Suppl ([place]) w/Device KIT 1 each by Does not apply route See admin instructions. Check blood sugar daily.  Dx: E11.9 1 kit 0  . cholecalciferol (VITAMIN D) 1000 units tablet Take 1,000 Units by mouth daily.    . clotrimazole-betamethasone (LOTRISONE) cream APPLY TO AFFECTED AREA(S) AS NEEDED (Patient not taking: Reported on 07/19/2017) 30 g 0  . colesevelam (WELCHOL) 625 MG tablet Take 3 tablets (1,875 mg total) by mouth 2 (two) times daily with a meal. 540 tablet 3  . Erenumab-aooe (AIMOVIG 140 DOSE) 70 MG/ML SOAJ Inject 140 mg into the skin every 30 (thirty) days. 1 pen 11  . gabapentin (NEURONTIN) 300 MG capsule TAKE TWO CAPSULES BY MOUTH TWO TIMES A DAY (Patient taking differently: Take 300 mg by mouth at bedtime. Pt may also take up  to three times times daily as needed for migraine) 60 capsule 5  . meclizine (ANTIVERT) 25 MG tablet Take 25-50 mg by mouth as needed for dizziness.    Marland Kitchen omeprazole (PRILOSEC) 20 MG capsule Take 1 capsule (20 mg total) by mouth 2 (two) times daily before a meal. 180 capsule 3  . ondansetron (ZOFRAN) 4 MG tablet Take 1 tablet (4 mg total) by mouth every 8 (eight) hours as needed for nausea or vomiting. (Patient not taking: Reported on 07/19/2017) 20 tablet 5  . ONETOUCH DELICA LANCETS 44W MISC Check blood sugar daily.  Dx: E11.9 100 each 3  . ONETOUCH VERIO test strip Check blood sugar daily.  Dx: E11.9 100 each 3  . promethazine (PHENERGAN) 12.5 MG suppository Place 1 suppository (12.5 mg total) rectally every 6 (six) hours as needed for nausea or vomiting. Please dont use together with the tablet (Patient not taking: Reported on 10/12/2017) 20 each 0  . promethazine (PHENERGAN) 12.5 MG tablet Take 12.5 mg by mouth every 6 (six) hours as needed for nausea or vomiting.    . SUMAtriptan (IMITREX) 6 MG/0.5ML SOLN injection Inject 0.5 mLs (6 mg total) into the skin every 2 (two) hours as needed for migraine (May repeast dose once after 2 hrs if needed, not to exceed 2 doses in 24 hours). 4.5 mL 5  . Syringe/Needle, Disp, (SYRINGE LUER LOCK) 25G X 5/8" 3 ML MISC Use as needed 50 each 2  No current facility-administered medications on file prior to visit.     ALLERGIES: Allergies  Allergen Reactions  . Atenolol   . Codeine     REACTION: Migraine  . Penicillins Itching    Has patient had a PCN reaction causing immediate rash, facial/tongue/throat swelling, SOB or lightheadedness with hypotension: No Has patient had a PCN reaction causing severe rash involving mucus membranes or skin necrosis: No Has patient had a PCN reaction that required hospitalization: No Has patient had a PCN reaction occurring within the last 10 years: No If all of the above answers are "NO", then may proceed with Cephalosporin  use.   Marland Kitchen Pentazocine Lactate     REACTION: Swelling, itching, rash  . Crestor [Rosuvastatin] Other (See Comments)  . Sudafed [Pseudoephedrine Hcl] Itching and Anxiety    FAMILY HISTORY: Family History  Problem Relation Age of Onset  . Cancer Mother        bone  . Hypertension Father   . Mitral valve prolapse Father   . Asthma Brother   . Arthritis Brother   . Nephrolithiasis Brother   . Nephrolithiasis Brother   . Asthma Brother   . Arthritis Brother   . Aortic aneurysm Brother        ascending aortic aneuysm  . Breast cancer Maternal Aunt   . Breast cancer Maternal Aunt     SOCIAL HISTORY: Social History   Socioeconomic History  . Marital status: Married    Spouse name: Not on file  . Number of children: 0  . Years of education: Not on file  . Highest education level: Not on file  Occupational History  . Occupation: retired Energy manager: retired  Scientific laboratory technician  . Financial resource strain: Not on file  . Food insecurity:    Worry: Not on file    Inability: Not on file  . Transportation needs:    Medical: Not on file    Non-medical: Not on file  Tobacco Use  . Smoking status: Former Research scientist (life sciences)  . Smokeless tobacco: Never Used  Substance and Sexual Activity  . Alcohol use: No    Frequency: Never  . Drug use: No  . Sexual activity: Not on file  Lifestyle  . Physical activity:    Days per week: Not on file    Minutes per session: Not on file  . Stress: Not on file  Relationships  . Social connections:    Talks on phone: Not on file    Gets together: Not on file    Attends religious service: Not on file    Active member of club or organization: Not on file    Attends meetings of clubs or organizations: Not on file    Relationship status: Not on file  . Intimate partner violence:    Fear of current or ex partner: Not on file    Emotionally abused: Not on file    Physically abused: Not on file    Forced sexual activity: Not on file  Other  Topics Concern  . Not on file  Social History Narrative   Regular exercise--yes, recumbent bike 3-4 days a week      Diet: fruits and veggies, water, eats at home, drinks a lot of milk    REVIEW OF SYSTEMS: Constitutional: No fevers, chills, or sweats, no generalized fatigue, change in appetite Eyes: No visual changes, double vision, eye pain Ear, nose and throat: No hearing loss, ear pain, nasal congestion, sore throat Cardiovascular:  No chest pain, palpitations Respiratory:  No shortness of breath at rest or with exertion, wheezes GastrointestinaI: No nausea, vomiting, diarrhea, abdominal pain, fecal incontinence Genitourinary:  No dysuria, urinary retention or frequency Musculoskeletal:  No neck pain, back pain Integumentary: No rash, pruritus, skin lesions Neurological: as above Psychiatric: No depression, insomnia, anxiety Endocrine: No palpitations, fatigue, diaphoresis, mood swings, change in appetite, change in weight, increased thirst Hematologic/Lymphatic:  No purpura, petechiae. Allergic/Immunologic: no itchy/runny eyes, nasal congestion, recent allergic reactions, rashes  PHYSICAL EXAM: Vitals:   10/12/17 0929  BP: 140/82  Pulse: 87  SpO2: 98%   General: No acute distress.  Patient appears well-groomed.   Head:  Normocephalic/atraumatic Eyes:  Fundi examined but not visualized Neck: supple, no paraspinal tenderness, full range of motion Heart:  Regular rate and rhythm Lungs:  Clear to auscultation bilaterally Back: No paraspinal tenderness Neurological Exam: alert and oriented to person, place, and time. Attention span and concentration intact, recent and remote memory intact, fund of knowledge intact.  Speech fluent and not dysarthric, language intact.  CN II-XII intact. Bulk and tone normal, muscle strength 5/5 throughout.  Sensation to light touch  intact.  Deep tendon reflexes 2+ throughout.  Finger to nose testing intact.  Gait normal  IMPRESSION: Chronic  migraine with and without aura, not intractable  PLAN: 1.  She will continue Aimovig 18m monthly and see how she does over the next 2 to 3 months off of prednisone 2.  Sumatriptan 1043mtablet or 63m72mC 3.  Limit pain relievers to no more than 2 days out of week 4.  Headache diary 5.  Follow up in 3 months.  AdaMetta ClinesO  CC:  Dr. BedDiona Browner

## 2017-10-12 NOTE — Patient Instructions (Signed)
Continue Aimovig Use sumatriptan as needed Follow up in 3 months.

## 2017-10-18 ENCOUNTER — Encounter: Payer: Self-pay | Admitting: Family Medicine

## 2017-11-07 ENCOUNTER — Encounter: Payer: Self-pay | Admitting: Family Medicine

## 2017-11-10 ENCOUNTER — Other Ambulatory Visit: Payer: Self-pay

## 2017-11-11 ENCOUNTER — Other Ambulatory Visit: Payer: Medicare Other

## 2017-11-15 ENCOUNTER — Ambulatory Visit: Payer: Medicare Other | Admitting: Family Medicine

## 2017-11-30 ENCOUNTER — Encounter: Payer: Self-pay | Admitting: Family Medicine

## 2017-12-01 NOTE — Telephone Encounter (Signed)
Copied from [place] [phone]. Topic: Appointment Scheduling - Scheduling Inquiry for Clinic >> Dec 01, 2017  3:08 PM Percell Belt A wrote: Reason for CRM: pt called in to make appt with Dr Diona Browner  per my chart message. She was hoping to get in next week.  There was nothing available.  She needs a lab appt for a1c and the an appt with DR Diona Browner.  Also the lab or for the A1c put in.  Please advise   Best number [phone] Cell [phone]

## 2017-12-02 ENCOUNTER — Telehealth: Payer: Self-pay | Admitting: Family Medicine

## 2017-12-02 NOTE — Telephone Encounter (Signed)
Dr. Diona Browner says it is okay to use her same day appointments next week.

## 2017-12-02 NOTE — Telephone Encounter (Signed)
See below crm First open appointment 7/11 or can I use same day appointments   Copied from [place] [phone]. Topic: Appointment Scheduling - Scheduling Inquiry for Clinic >> Dec 01, 2017  3:08 PM Percell Belt A wrote: Reason for CRM: pt called in to make appt with Dr Diona Browner  per my chart message. She was hoping to get in next week.  There was nothing available.  She needs a lab appt for a1c and the an appt with DR Diona Browner.  Also the lab or for the A1c put in.  Please advise   Best number -[phone] Cell [phone]

## 2017-12-02 NOTE — Telephone Encounter (Signed)
Appointment 7/3 pt aware

## 2017-12-06 ENCOUNTER — Encounter: Payer: Self-pay | Admitting: Family Medicine

## 2017-12-06 ENCOUNTER — Other Ambulatory Visit: Payer: Self-pay | Admitting: *Deleted

## 2017-12-06 ENCOUNTER — Encounter: Payer: Self-pay | Admitting: *Deleted

## 2017-12-07 ENCOUNTER — Ambulatory Visit: Payer: Medicare Other | Admitting: Family Medicine

## 2017-12-07 ENCOUNTER — Other Ambulatory Visit (INDEPENDENT_AMBULATORY_CARE_PROVIDER_SITE_OTHER): Payer: Medicare Other

## 2017-12-07 DIAGNOSIS — R82998 Other abnormal findings in urine: Secondary | ICD-10-CM | POA: Diagnosis not present

## 2017-12-07 DIAGNOSIS — E119 Type 2 diabetes mellitus without complications: Secondary | ICD-10-CM | POA: Diagnosis not present

## 2017-12-07 LAB — POC URINALSYSI DIPSTICK (AUTOMATED)
Glucose, UA: NEGATIVE
Nitrite, UA: NEGATIVE
Protein, UA: POSITIVE — AB
Spec Grav, UA: 1.02 (ref 1.010–1.025)
Urobilinogen, UA: 0.2 E.U./dL
pH, UA: 6 (ref 5.0–8.0)

## 2017-12-07 LAB — POCT GLYCOSYLATED HEMOGLOBIN (HGB A1C): Hemoglobin A1C: 7.5 % — AB (ref 4.0–5.6)

## 2017-12-07 NOTE — Addendum Note (Signed)
Addended by: Carter Kitten on: 12/07/2017 09:28 AM   Modules accepted: Orders

## 2017-12-08 LAB — URINE CULTURE
MICRO NUMBER:: 90793440
SPECIMEN QUALITY:: ADEQUATE

## 2017-12-09 ENCOUNTER — Ambulatory Visit (INDEPENDENT_AMBULATORY_CARE_PROVIDER_SITE_OTHER): Payer: Medicare Other | Admitting: Family Medicine

## 2017-12-09 ENCOUNTER — Encounter: Payer: Self-pay | Admitting: Family Medicine

## 2017-12-09 VITALS — BP 120/60 | HR 102 | Temp 98.4°F | Ht 60.0 in | Wt 130.0 lb

## 2017-12-09 DIAGNOSIS — R634 Abnormal weight loss: Secondary | ICD-10-CM | POA: Diagnosis not present

## 2017-12-09 DIAGNOSIS — E440 Moderate protein-calorie malnutrition: Secondary | ICD-10-CM | POA: Diagnosis not present

## 2017-12-09 DIAGNOSIS — K861 Other chronic pancreatitis: Secondary | ICD-10-CM

## 2017-12-09 DIAGNOSIS — E785 Hyperlipidemia, unspecified: Secondary | ICD-10-CM

## 2017-12-09 DIAGNOSIS — E119 Type 2 diabetes mellitus without complications: Secondary | ICD-10-CM | POA: Diagnosis not present

## 2017-12-09 LAB — CBC WITH DIFFERENTIAL/PLATELET
Basophils Absolute: 0.1 10*3/uL (ref 0.0–0.1)
Basophils Relative: 1 % (ref 0.0–3.0)
Eosinophils Absolute: 0.2 10*3/uL (ref 0.0–0.7)
Eosinophils Relative: 1.4 % (ref 0.0–5.0)
HCT: 38.9 % (ref 36.0–46.0)
Hemoglobin: 13.2 g/dL (ref 12.0–15.0)
Lymphocytes Relative: 12 % (ref 12.0–46.0)
Lymphs Abs: 1.3 10*3/uL (ref 0.7–4.0)
MCHC: 33.8 g/dL (ref 30.0–36.0)
MCV: 91 fl (ref 78.0–100.0)
Monocytes Absolute: 1 10*3/uL (ref 0.1–1.0)
Monocytes Relative: 9.5 % (ref 3.0–12.0)
Neutro Abs: 8.3 10*3/uL — ABNORMAL HIGH (ref 1.4–7.7)
Neutrophils Relative %: 76.1 % (ref 43.0–77.0)
Platelets: 470 10*3/uL — ABNORMAL HIGH (ref 150.0–400.0)
RBC: 4.28 Mil/uL (ref 3.87–5.11)
RDW: 15 % (ref 11.5–15.5)
WBC: 10.9 10*3/uL — ABNORMAL HIGH (ref 4.0–10.5)

## 2017-12-09 LAB — COMPREHENSIVE METABOLIC PANEL
ALT: 406 U/L — ABNORMAL HIGH (ref 0–35)
AST: 302 U/L — ABNORMAL HIGH (ref 0–37)
Albumin: 3.7 g/dL (ref 3.5–5.2)
Alkaline Phosphatase: 610 U/L — ABNORMAL HIGH (ref 39–117)
BUN: 13 mg/dL (ref 6–23)
CO2: 27 mEq/L (ref 19–32)
Calcium: 9.4 mg/dL (ref 8.4–10.5)
Chloride: 101 mEq/L (ref 96–112)
Creatinine, Ser: 0.98 mg/dL (ref 0.40–1.20)
GFR: 60.24 mL/min (ref >60.00)
Glucose, Bld: 234 mg/dL — ABNORMAL HIGH (ref 70–99)
Potassium: 4.1 mEq/L (ref 3.5–5.1)
Sodium: 137 mEq/L (ref 135–145)
Total Bilirubin: 3.1 mg/dL — ABNORMAL HIGH (ref 0.2–1.2)
Total Protein: 7.9 g/dL (ref 6.0–8.3)

## 2017-12-09 LAB — LIPID PANEL
Cholesterol: 218 mg/dL — ABNORMAL HIGH (ref 0–200)
HDL: 47.3 mg/dL (ref >39.00)
LDL Cholesterol: 140 mg/dL — ABNORMAL HIGH (ref 0–99)
NonHDL: 170.8
Total CHOL/HDL Ratio: 5
Triglycerides: 152 mg/dL — ABNORMAL HIGH (ref 0.0–149.0)
VLDL: 30.4 mg/dL (ref 0.0–40.0)

## 2017-12-09 LAB — VITAMIN D 25 HYDROXY (VIT D DEFICIENCY, FRACTURES): VITD: 49.39 ng/mL (ref 30.00–100.00)

## 2017-12-09 LAB — VITAMIN B12: Vitamin B-12: 998 pg/mL — ABNORMAL HIGH (ref 211–911)

## 2017-12-09 MED ORDER — CLOTRIMAZOLE-BETAMETHASONE 1-0.05 % EX CREA: TOPICAL_CREAM | CUTANEOUS | 0 refills | Status: DC

## 2017-12-09 NOTE — Assessment & Plan Note (Signed)
Follwoed by GI.Marland Kitchen Currently on prednisone x 1 month. Following CBGs closely.

## 2017-12-09 NOTE — Assessment & Plan Note (Signed)
Due for re-eval. 

## 2017-12-09 NOTE — Patient Instructions (Signed)
Please stop at the lab to have labs drawn.  

## 2017-12-09 NOTE — Assessment & Plan Note (Signed)
Offered referral to ENDO given complicated nature of her DM with  Auto-immune pancreatitis. She would like to follow  For now.  Discussed need for insulin may develop.

## 2017-12-09 NOTE — Assessment & Plan Note (Signed)
Eval with prealbumin and CMET

## 2017-12-09 NOTE — Progress Notes (Signed)
Subjective:    Patient ID: Debra Leach, female    DOB: 23-Nov-1950, 67 y.o.   MRN: [id number]  HPI    67 year old female with history of autoimmune pancreatitis and diabetes presents for follow up.  Diabetes:  Worsened control now back on prednisone.  Lab Results  Component Value Date   HGBA1C 7.5 (A) 12/07/2017  Using medications without difficulties: Hypoglycemic episodes: Hyperglycemic episodes: Feet problems: Blood Sugars averaging: FBS 92-138  eye exam within last year:  Autoimmune pancreatitis recurrence:  Followoed by [facility] Dr. Jerene Pitch. Locally Dr. Earlean Shawl.  She has started a month long course of prednisone 10 mg x 2 weeks and 5 mg x 2 weeks. Has follow up in 1 month.   Was gaining weight but now losing weight again. Still with nausea. Wt Readings from Last 3 Encounters:  12/09/17 130 lb (59 kg)  10/12/17 137 lb (62.1 kg)  08/16/17 123 lb (55.8 kg)     She has had to stop her medication. Not taking statin.  She has stopped omeprazole for possible relationship with pancreatitis.  Pepcid AC handling GERD.  Blood pressure 120/60, pulse (!) 102, temperature 98.4 F (36.9 C), temperature source Oral, height 5' (1.524 m), weight 130 lb (59 kg). Social History /Family History/Past Medical History reviewed in detail and updated in EMR if needed.    Review of Systems  Constitutional: Negative for fatigue and fever.  HENT: Negative for ear pain.   Eyes: Negative for pain.  Respiratory: Negative for chest tightness and shortness of breath.   Cardiovascular: Negative for chest pain, palpitations and leg swelling.  Gastrointestinal: Positive for abdominal pain and nausea. Negative for blood in stool and diarrhea.  Genitourinary: Negative for dysuria.       Objective:   Physical Exam  Constitutional: Vital signs are normal. She appears well-developed and well-nourished. She appears cachectic. She is cooperative.  Non-toxic appearance. She does not appear ill.  No distress.  HENT:  Head: Normocephalic.  Right Ear: Hearing, tympanic membrane, external ear and ear canal normal. Tympanic membrane is not erythematous, not retracted and not bulging.  Left Ear: Hearing, tympanic membrane, external ear and ear canal normal. Tympanic membrane is not erythematous, not retracted and not bulging.  Nose: No mucosal edema or rhinorrhea. Right sinus exhibits no maxillary sinus tenderness and no frontal sinus tenderness. Left sinus exhibits no maxillary sinus tenderness and no frontal sinus tenderness.  Mouth/Throat: Uvula is midline, oropharynx is clear and moist and mucous membranes are normal.  Eyes: Pupils are equal, round, and reactive to light. Conjunctivae, EOM and lids are normal. Lids are everted and swept, no foreign bodies found.  Neck: Trachea normal and normal range of motion. Neck supple. Carotid bruit is not present. No thyroid mass and no thyromegaly present.  Cardiovascular: Normal rate, regular rhythm, S1 normal, S2 normal, normal heart sounds, intact distal pulses and normal pulses. Exam reveals no gallop and no friction rub.  No murmur heard. Pulmonary/Chest: Effort normal and breath sounds normal. No tachypnea. No respiratory distress. She has no decreased breath sounds. She has no wheezes. She has no rhonchi. She has no rales.  Abdominal: Soft. Normal appearance and bowel sounds are normal. There is tenderness in the epigastric area.  Neurological: She is alert.  Skin: Skin is warm, dry and intact. No rash noted.  Psychiatric: Her speech is normal and behavior is normal. Judgment and thought content normal. Her mood appears not anxious. Cognition and memory are normal.  She does not exhibit a depressed mood.     Diabetic foot exam: Normal inspection No skin breakdown No calluses  Normal DP pulses Normal sensation to light touch and monofilament Nails normal      Assessment & Plan:

## 2017-12-10 LAB — PREALBUMIN: Prealbumin: 18 mg/dL (ref 17–34)

## 2017-12-14 ENCOUNTER — Other Ambulatory Visit: Payer: Self-pay | Admitting: Family Medicine

## 2017-12-14 DIAGNOSIS — Z1231 Encounter for screening mammogram for malignant neoplasm of breast: Secondary | ICD-10-CM

## 2017-12-15 ENCOUNTER — Telehealth: Payer: Self-pay | Admitting: Family Medicine

## 2017-12-15 NOTE — Telephone Encounter (Signed)
Noted  

## 2017-12-15 NOTE — Telephone Encounter (Signed)
Spoke with Debra Leach.  She states Dr. Liliane Channel PA Rolene Course contacted her a couple of night ago.  She is scheduled for lab work tomorrow at their office.  Dr. Earlean Shawl wants to repeat her LFTs and run some other test.  Depending on what the results show, they told her they may need to adjust her medications and possible repeat a CT.  She did want Dr. Diona Browner to know she is still losing weight, not as bad as previous but still losing.

## 2017-12-15 NOTE — Telephone Encounter (Signed)
Please call pt.. I have not heard back from Dr. Earlean Shawl about the liver test elevations ( sent through Epic).. Has she been in contact with him?  If not.. Please call his office to find best way to have him review the labs.. Fax them etc?

## 2017-12-16 ENCOUNTER — Encounter: Payer: Self-pay | Admitting: Family Medicine

## 2018-01-20 DIAGNOSIS — M8580 Other specified disorders of bone density and structure, unspecified site: Secondary | ICD-10-CM

## 2018-01-25 NOTE — Progress Notes (Signed)
NEUROLOGY FOLLOW UP OFFICE NOTE  Debra Leach [id number]  HISTORY OF PRESENT ILLNESS: Debra Leach is a 67 year old right-handed female with migraines, hyperlipidemia, diabetes, GERD, osteoarthritis, autoimmune pancreatitis and history of MVP and renal stones who follows up for migraines.  UPDATE: Unable to stay off of prednisone due to autoimmune pancreatitis. Intensity:  1-9/10 Duration:  2 1/2 to 6 hours Frequency:  79 headache days over past 103 days (67 days 1-5/10, 15 days 6-9/10) Frequency of abortive medication: 2-4 days a month Current NSAIDS/steroid:  Prednisone 5mg  daily Current analgesics:  no Current triptans:  sumatriptan 6mg   Current ergotamine:  no Current anti-emetic:  Promethazine 12.5mg  Current muscle relaxants:  no Current anti-anxiolytic:  no Current sleep aide:  no Current Antihypertensive medications:  no Current Antidepressant medications:  no Current Anticonvulsant medications:  Gabapentin 300mg  (take as needed) Current anti-CGRP:  Aimovig 140mg  monthly Current Vitamins/Herbal/Supplements: D Current Antihistamines/Decongestants:  no Other therapy:  Cefaly, ice pack Other medication:  meclizine  Caffeine:  1 cup of coffee daily Alcohol:  no Smoker:  no Diet:  Follows LEAP ImmunoCalm Dietary Program Exercise:  When tolerated (due to knee problem).  She walks and bikes Depression:  no; Anxiety:  no Other pain:  some Sleep hygiene:  improved   HISTORY: Onset:  67 years old Location: Varies (unilateral either side, frontal-temporal, back of head) Quality: Varies (stabbing, pounding, throbbing) Initial Intensity: 10/10 severe, otherwise 5-7/10 Aura: Occurs off and on over the years and varies in semiology. In early 1970s, black out. In late 1980s, white out. In late 1990s, flashing lights and zigzag lines. Since 2016, scintillating scotoma (occurs 1 to 2 times a month) Prodrome: no Associated symptoms: Initially vomiting.  Sometimes nausea. Photophobia, phonophobia. Dizziness. Initial Duration: All day Initial Frequency: daily Triggers/aggravating factors:  Change in weather, emotional stress, valsalva maneuver, odors, certain foods Relieving factors: ice, rest Activity: Difficult to function if severe  Past NSAIDS: diclofenac 50mg , Cambia, Mobic 15mg , ibuprofen, naproxen, toradol  Past analgesics: tramadol (reaction), Midrin, Excedrin Past abortive triptans/ergots: Treximet, Axert, Amerge, Frova, Maxalt, Relpax, Zomig tablet, DHE NS, sumatriptan 100mg  Past anti-emetic:  Zofran 4mg  Past anxiolytic: Buspirone, clonazepam Past antihypertensive medications: Metoprolol, Norvasc, propranolol ER 120mg , atenolol 100mg  (side effects dizziness, myalgias, acid reflux) Past antidepressant medications: Venlafaxine, sertraline, amitriptyline, amoxapine, duloxetine, nortriptyline, imipramine, Luvox, Remeron, Serzone, bupropion, desipramine, doxepin, Vivactil Past anticonvulsant medications: Depakote, Topamax, zonisamide, Lamictal, Keppra, Lyrica, gabapentin 300/300/600  Past vitamins/Herbal/Supplements: butterbur, Mg, CoQ10 Other past treatments: Botox (2 years), Cefaly Device, methylergonovine, acupuncture, biofeedback, Seroquel, Thorazine, methylsergide maleate  Family history of headache: Maybe her mother's side.  Prior brain MRI normal (date unknown but report is mentioned in prior notes).  Abnormal Movements: In December 2017, we started atenolol for migraine prevention.  It worked well, but due to reported side effects of dizziness, acid reflux and joint and muscle pain, she was tapered off of it in early April.  Around that time, she developed shaking episodes or tremors associated with her migraines.  She followed up with her PCP, Dr. Diona Browner, on 10/08/16 and we had her discontinue the gabapentin.  She hasn't had any recurrent tremors, however she says she has gone up to 4 days at a time without an  attack.  Since discontinuing the gabapentin, the intensity of her daily headaches have gotten worse.     I reviewed the video of her habitual attacks.  In the video, she exhibits flapping of her right hand, arrhythmic and without tremor or choreiform  movement.  It is not an arrhythmic jerking, consistent with myoclonus.  It sometimes involves the left hand as well.  She denies stress and anxiety.  PAST MEDICAL HISTORY: Past Medical History:  Diagnosis Date  . Arthritis    osteoarthritis B knees, left hip , right elbow  . Complication of anesthesia    difficult waking  . Diabetes mellitus without complication ([place])    Diet and exercise controlled  . GERD (gastroesophageal reflux disease)   . Headache   . History of kidney stones   . Hyperlipidemia   . MVP (mitral valve prolapse)    history of  . PONV (postoperative nausea and vomiting)     MEDICATIONS: Current Outpatient Medications on File Prior to Visit  Medication Sig Dispense Refill  . clotrimazole-betamethasone (LOTRISONE) cream APPLY TO AFFECTED AREA(S) AS NEEDED 30 g 0  . Erenumab-aooe (AIMOVIG 140 DOSE) 70 MG/ML SOAJ Inject 140 mg into the skin every 30 (thirty) days. (Patient not taking: Reported on 12/09/2017) 1 pen 11  . famotidine (PEPCID) 20 MG tablet Take 20 mg by mouth 2 (two) times daily.    Marland Kitchen gabapentin (NEURONTIN) 300 MG capsule TAKE TWO CAPSULES BY MOUTH TWO TIMES A DAY 60 capsule 5  . promethazine (PHENERGAN) 12.5 MG tablet Take 12.5 mg by mouth every 6 (six) hours as needed for nausea or vomiting.     No current facility-administered medications on file prior to visit.     ALLERGIES: Allergies  Allergen Reactions  . Atenolol   . Codeine     REACTION: Migraine  . Penicillins Itching    Has patient had a PCN reaction causing immediate rash, facial/tongue/throat swelling, SOB or lightheadedness with hypotension: No Has patient had a PCN reaction causing severe rash involving mucus membranes or skin necrosis:  No Has patient had a PCN reaction that required hospitalization: No Has patient had a PCN reaction occurring within the last 10 years: No If all of the above answers are "NO", then may proceed with Cephalosporin use.   Marland Kitchen Pentazocine Lactate     REACTION: Swelling, itching, rash  . Crestor [Rosuvastatin] Other (See Comments)  . Sudafed [Pseudoephedrine Hcl] Itching and Anxiety    FAMILY HISTORY: Family History  Problem Relation Age of Onset  . Cancer Mother        bone  . Hypertension Father   . Mitral valve prolapse Father   . Asthma Brother   . Arthritis Brother   . Nephrolithiasis Brother   . Nephrolithiasis Brother   . Asthma Brother   . Arthritis Brother   . Aortic aneurysm Brother        ascending aortic aneuysm  . Breast cancer Maternal Aunt   . Breast cancer Maternal Aunt     SOCIAL HISTORY: Social History   Socioeconomic History  . Marital status: Married    Spouse name: Not on file  . Number of children: 0  . Years of education: Not on file  . Highest education level: Not on file  Occupational History  . Occupation: retired Energy manager: retired  Scientific laboratory technician  . Financial resource strain: Not on file  . Food insecurity:    Worry: Not on file    Inability: Not on file  . Transportation needs:    Medical: Not on file    Non-medical: Not on file  Tobacco Use  . Smoking status: Former Research scientist (life sciences)  . Smokeless tobacco: Never Used  Substance and Sexual Activity  . Alcohol  use: No    Frequency: Never  . Drug use: No  . Sexual activity: Not on file  Lifestyle  . Physical activity:    Days per week: Not on file    Minutes per session: Not on file  . Stress: Not on file  Relationships  . Social connections:    Talks on phone: Not on file    Gets together: Not on file    Attends religious service: Not on file    Active member of club or organization: Not on file    Attends meetings of clubs or organizations: Not on file    Relationship  status: Not on file  . Intimate partner violence:    Fear of current or ex partner: Not on file    Emotionally abused: Not on file    Physically abused: Not on file    Forced sexual activity: Not on file  Other Topics Concern  . Not on file  Social History Narrative   Regular exercise--yes, recumbent bike 3-4 days a week      Diet: fruits and veggies, water, eats at home, drinks a lot of milk    REVIEW OF SYSTEMS: Constitutional: No fevers, chills, or sweats, no generalized fatigue, change in appetite Eyes: No visual changes, double vision, eye pain Ear, nose and throat: No hearing loss, ear pain, nasal congestion, sore throat Cardiovascular: No chest pain, palpitations Respiratory:  No shortness of breath at rest or with exertion, wheezes GastrointestinaI: Nausea Genitourinary:  No dysuria, urinary retention or frequency Musculoskeletal:  No neck pain, back pain Integumentary: No rash, pruritus, skin lesions Neurological: as above Psychiatric: No depression, insomnia, anxiety Endocrine: No palpitations, fatigue, diaphoresis, mood swings, change in appetite, change in weight, increased thirst Hematologic/Lymphatic:  No purpura, petechiae. Allergic/Immunologic: no itchy/runny eyes, nasal congestion, recent allergic reactions, rashes  PHYSICAL EXAM: Blood pressure 136/80, pulse 87, height 5' 0.5" (1.537 m), weight 130 lb (59 kg), SpO2 98 %. General: No acute distress.  Patient appears well-groomed.  normal body habitus. Head:  Normocephalic/atraumatic Eyes:  Fundi examined but not visualized Neck: supple, no paraspinal tenderness, full range of motion Heart:  Regular rate and rhythm Lungs:  Clear to auscultation bilaterally Back: No paraspinal tenderness Neurological Exam: alert and oriented to person, place, and time. Attention span and concentration intact, recent and remote memory intact, fund of knowledge intact.  Speech fluent and not dysarthric, language intact.  CN II-XII  intact. Bulk and tone normal, muscle strength 5/5 throughout.  Sensation to light touch  intact.  Deep tendon reflexes 2+ throughout.  Finger to nose testing intact.  Gait normal, Romberg negative.   IMPRESSION: Chronic migraine without aura, without status migrainosus, not intractable.  PLAN: 1.  Continue Aimovig 140mg  monthly. Restart riboflavin 400mg  daily 2.  Sumatriptan as needed, limited to no more than 2 days out of week to prevent rebound headache 3.  Gabapentin refilled 4.  Continue headache diary 5.  Follow up in 5 months.  Metta Clines, DO  CC: Eliezer Lofts, MD

## 2018-01-26 ENCOUNTER — Encounter: Payer: Self-pay | Admitting: Neurology

## 2018-01-26 ENCOUNTER — Ambulatory Visit (INDEPENDENT_AMBULATORY_CARE_PROVIDER_SITE_OTHER): Payer: Medicare Other | Admitting: Neurology

## 2018-01-26 VITALS — BP 136/80 | HR 87 | Ht 60.5 in | Wt 130.0 lb

## 2018-01-26 DIAGNOSIS — G43709 Chronic migraine without aura, not intractable, without status migrainosus: Secondary | ICD-10-CM | POA: Diagnosis not present

## 2018-01-26 NOTE — Patient Instructions (Signed)
1.  Continue Aimovig 140mg  monthly 2.  Sumatriptan as needed, limited to no more than 2 days out of week to prevent rebound headache 3.  Gabapentin refilled 4.  Continue headache diary 5.  Follow up in 5 months.

## 2018-01-28 ENCOUNTER — Other Ambulatory Visit: Payer: Self-pay | Admitting: Neurology

## 2018-01-30 ENCOUNTER — Ambulatory Visit
Admission: RE | Admit: 2018-01-30 | Discharge: 2018-01-30 | Disposition: A | Discharge status: Home / Self Care | Payer: Medicare Other | Source: Ambulatory Visit | Attending: Family Medicine | Admitting: Family Medicine

## 2018-01-30 DIAGNOSIS — Z1231 Encounter for screening mammogram for malignant neoplasm of breast: Secondary | ICD-10-CM

## 2018-02-07 ENCOUNTER — Ambulatory Visit (INDEPENDENT_AMBULATORY_CARE_PROVIDER_SITE_OTHER): Payer: Medicare Other | Admitting: Family Medicine

## 2018-02-07 ENCOUNTER — Encounter

## 2018-02-07 ENCOUNTER — Encounter: Payer: Self-pay | Admitting: Family Medicine

## 2018-02-07 ENCOUNTER — Other Ambulatory Visit: Payer: Medicare Other

## 2018-02-07 VITALS — BP 140/82 | HR 85 | Temp 98.5°F | Ht 60.0 in | Wt 128.8 lb

## 2018-02-07 DIAGNOSIS — K861 Other chronic pancreatitis: Secondary | ICD-10-CM | POA: Diagnosis not present

## 2018-02-07 DIAGNOSIS — E440 Moderate protein-calorie malnutrition: Secondary | ICD-10-CM | POA: Diagnosis not present

## 2018-02-07 DIAGNOSIS — Z23 Encounter for immunization: Secondary | ICD-10-CM | POA: Diagnosis not present

## 2018-02-07 DIAGNOSIS — E119 Type 2 diabetes mellitus without complications: Secondary | ICD-10-CM | POA: Diagnosis not present

## 2018-02-07 DIAGNOSIS — R030 Elevated blood-pressure reading, without diagnosis of hypertension: Secondary | ICD-10-CM | POA: Insufficient documentation

## 2018-02-07 LAB — HM DIABETES FOOT EXAM

## 2018-02-07 MED ORDER — INSULIN GLARGINE 100 UNIT/ML SOLOSTAR PEN: 10.0000 [IU] | PEN_INJECTOR | Freq: Every day | SUBCUTANEOUS | 11 refills | Status: DC

## 2018-02-07 MED ORDER — INSULIN PEN NEEDLE 29G X 12.7MM MISC: 11 refills | Status: DC

## 2018-02-07 MED ORDER — GLUCOSE BLOOD VI STRP: ORAL_STRIP | 12 refills | Status: DC

## 2018-02-07 MED ORDER — ONETOUCH ULTRASOFT LANCETS MISC: 12 refills | Status: DC

## 2018-02-07 NOTE — Patient Instructions (Addendum)
Follow BP at home .Marland Kitchen Call or email results in the 1-2 weeks.  Start 10 units Lantus daily.  Follow blood sugars at home.. Fasting and one 2 hours after meals.Marland Kitchenemail measurements in 2 weeks.

## 2018-02-07 NOTE — Assessment & Plan Note (Signed)
Will start lantus solostar pen low dose.. Follow FBS.. Will likely need to titrate.

## 2018-02-07 NOTE — Assessment & Plan Note (Signed)
May be new as SE of prednisone.. Follow at home. May need med to treat.

## 2018-02-07 NOTE — Addendum Note (Signed)
Addended by: Carter Kitten on: 02/07/2018 10:32 AM   Modules accepted: Orders

## 2018-02-07 NOTE — Progress Notes (Signed)
   Subjective:    Patient ID: Debra Leach, female    DOB: 1950/07/31, 67 y.o.   MRN: [id number]  HPI  67 year old female now with worsening DM control given recent need for longterm prednisone in setting of autoimmune pancreatitis and malnutrition.  She is still havign several days a week of severe nausea.. Taking phenergan daily. Her FBS have been running 150-161, > 200-224 after meals.  Wt Readings from Last 3 Encounters:  02/07/18 128 lb 12 oz (58.4 kg)  01/26/18 130 lb (59 kg)  12/09/17 130 lb (59 kg)    She has also noted BPs elevated at [facility] over the summer. 140-160/80   Social History /Family History/Past Medical History reviewed in detail and updated in EMR if needed. Blood pressure 140/82, pulse 85, temperature 98.5 F (36.9 C), temperature source Oral, height 5' (1.524 m), weight 128 lb 12 oz (58.4 kg).   Review of Systems  Constitutional: Negative for fatigue and fever.  HENT: Negative for congestion.   Eyes: Negative for pain.  Respiratory: Negative for cough and shortness of breath.   Cardiovascular: Negative for chest pain, palpitations and leg swelling.  Gastrointestinal: Positive for nausea. Negative for abdominal pain.  Genitourinary: Negative for dysuria and vaginal bleeding.  Musculoskeletal: Negative for back pain.  Neurological: Negative for syncope, light-headedness and headaches.  Psychiatric/Behavioral: Negative for dysphoric mood.       Objective:   Physical Exam  Constitutional: Vital signs are normal. She appears well-developed and well-nourished. She is cooperative.  Non-toxic appearance. She does not appear ill. No distress.  HENT:  Head: Normocephalic.  Right Ear: Hearing, tympanic membrane, external ear and ear canal normal. Tympanic membrane is not erythematous, not retracted and not bulging.  Left Ear: Hearing, tympanic membrane, external ear and ear canal normal. Tympanic membrane is not erythematous, not retracted and not bulging.    Nose: No mucosal edema or rhinorrhea. Right sinus exhibits no maxillary sinus tenderness and no frontal sinus tenderness. Left sinus exhibits no maxillary sinus tenderness and no frontal sinus tenderness.  Mouth/Throat: Uvula is midline, oropharynx is clear and moist and mucous membranes are normal.  Eyes: Pupils are equal, round, and reactive to light. Conjunctivae, EOM and lids are normal. Lids are everted and swept, no foreign bodies found.  Neck: Trachea normal and normal range of motion. Neck supple. Carotid bruit is not present. No thyroid mass and no thyromegaly present.  Cardiovascular: Normal rate, regular rhythm, S1 normal, S2 normal, normal heart sounds, intact distal pulses and normal pulses. Exam reveals no gallop and no friction rub.  No murmur heard. Pulmonary/Chest: Effort normal and breath sounds normal. No tachypnea. No respiratory distress. She has no decreased breath sounds. She has no wheezes. She has no rhonchi. She has no rales.  Abdominal: Soft. Normal appearance and bowel sounds are normal. There is no tenderness.  Neurological: She is alert.  Skin: Skin is warm, dry and intact. No rash noted.  Psychiatric: Her speech is normal and behavior is normal. Judgment and thought content normal. Her mood appears not anxious. Cognition and memory are normal. She does not exhibit a depressed mood.   Diabetic foot exam: Normal inspection No skin breakdown No calluses  Normal DP pulses Normal sensation to light touch and monofilament Nails normal        Assessment & Plan:

## 2018-02-10 ENCOUNTER — Ambulatory Visit: Payer: Medicare Other | Admitting: Family Medicine

## 2018-03-01 ENCOUNTER — Other Ambulatory Visit: Payer: Self-pay

## 2018-03-01 MED ORDER — PREDNISONE 10 MG (21) PO TBPK: ORAL_TABLET | ORAL | 0 refills | Status: DC

## 2018-03-27 ENCOUNTER — Telehealth: Payer: Self-pay | Admitting: Family Medicine

## 2018-03-27 ENCOUNTER — Other Ambulatory Visit (INDEPENDENT_AMBULATORY_CARE_PROVIDER_SITE_OTHER): Payer: Medicare Other

## 2018-03-27 DIAGNOSIS — E119 Type 2 diabetes mellitus without complications: Secondary | ICD-10-CM

## 2018-03-27 DIAGNOSIS — E559 Vitamin D deficiency, unspecified: Secondary | ICD-10-CM

## 2018-03-27 DIAGNOSIS — E785 Hyperlipidemia, unspecified: Secondary | ICD-10-CM

## 2018-03-27 LAB — LIPID PANEL
Cholesterol: 287 mg/dL — ABNORMAL HIGH (ref 0–200)
HDL: 68.1 mg/dL (ref >39.00)
NonHDL: 219.16
Total CHOL/HDL Ratio: 4
Triglycerides: 245 mg/dL — ABNORMAL HIGH (ref 0.0–149.0)
VLDL: 49 mg/dL — ABNORMAL HIGH (ref 0.0–40.0)

## 2018-03-27 LAB — COMPREHENSIVE METABOLIC PANEL
ALT: 22 U/L (ref 0–35)
AST: 18 U/L (ref 0–37)
Albumin: 3.8 g/dL (ref 3.5–5.2)
Alkaline Phosphatase: 71 U/L (ref 39–117)
BUN: 26 mg/dL — ABNORMAL HIGH (ref 6–23)
CO2: 30 mEq/L (ref 19–32)
Calcium: 9.7 mg/dL (ref 8.4–10.5)
Chloride: 103 mEq/L (ref 96–112)
Creatinine, Ser: 1.07 mg/dL (ref 0.40–1.20)
GFR: 54.38 mL/min — ABNORMAL LOW (ref >60.00)
Glucose, Bld: 134 mg/dL — ABNORMAL HIGH (ref 70–99)
Potassium: 4.3 mEq/L (ref 3.5–5.1)
Sodium: 140 mEq/L (ref 135–145)
Total Bilirubin: 0.6 mg/dL (ref 0.2–1.2)
Total Protein: 7.1 g/dL (ref 6.0–8.3)

## 2018-03-27 LAB — MICROALBUMIN / CREATININE URINE RATIO
Creatinine,U: 21.8 mg/dL
Microalb Creat Ratio: 6.4 mg/g (ref 0.0–30.0)
Microalb, Ur: 1.4 mg/dL (ref 0.0–1.9)

## 2018-03-27 LAB — LDL CHOLESTEROL, DIRECT: Direct LDL: 197 mg/dL

## 2018-03-27 LAB — HEMOGLOBIN A1C: Hgb A1c MFr Bld: 10.1 % — ABNORMAL HIGH (ref 4.6–6.5)

## 2018-03-27 NOTE — Telephone Encounter (Signed)
Orders

## 2018-03-28 ENCOUNTER — Ambulatory Visit
Admission: RE | Admit: 2018-03-28 | Discharge: 2018-03-28 | Disposition: A | Discharge status: Home / Self Care | Payer: Medicare Other | Source: Ambulatory Visit | Attending: Family Medicine | Admitting: Family Medicine

## 2018-03-28 DIAGNOSIS — M8580 Other specified disorders of bone density and structure, unspecified site: Secondary | ICD-10-CM

## 2018-03-30 LAB — HM DIABETES EYE EXAM

## 2018-03-31 ENCOUNTER — Encounter: Payer: Self-pay | Admitting: Family Medicine

## 2018-03-31 ENCOUNTER — Ambulatory Visit (INDEPENDENT_AMBULATORY_CARE_PROVIDER_SITE_OTHER): Payer: Medicare Other | Admitting: Family Medicine

## 2018-03-31 VITALS — BP 130/82 | HR 77 | Temp 98.4°F | Ht 60.5 in | Wt 130.2 lb

## 2018-03-31 DIAGNOSIS — K861 Other chronic pancreatitis: Secondary | ICD-10-CM

## 2018-03-31 DIAGNOSIS — E785 Hyperlipidemia, unspecified: Secondary | ICD-10-CM

## 2018-03-31 DIAGNOSIS — Z0000 Encounter for general adult medical examination without abnormal findings: Secondary | ICD-10-CM

## 2018-03-31 DIAGNOSIS — I700 Atherosclerosis of aorta: Secondary | ICD-10-CM

## 2018-03-31 DIAGNOSIS — R030 Elevated blood-pressure reading, without diagnosis of hypertension: Secondary | ICD-10-CM

## 2018-03-31 LAB — HM DIABETES FOOT EXAM

## 2018-03-31 MED ORDER — ATORVASTATIN CALCIUM 80 MG PO TABS: 80.0000 mg | ORAL_TABLET | Freq: Every day | ORAL | 3 refills | Status: DC

## 2018-03-31 MED ORDER — COLESEVELAM HCL 625 MG PO TABS: 1875.0000 mg | ORAL_TABLET | Freq: Two times a day (BID) | ORAL | 3 refills | Status: DC

## 2018-03-31 MED ORDER — OMEPRAZOLE 20 MG PO CPDR: 20.0000 mg | DELAYED_RELEASE_CAPSULE | Freq: Two times a day (BID) | ORAL | 3 refills | Status: DC

## 2018-03-31 NOTE — Progress Notes (Signed)
Subjective:    Patient ID: Debra Leach, female    DOB: 24-Jun-1950, 67 y.o.   MRN: [id number]  HPI  The patient presents for annual medicare wellness, complete physical and review of chronic health problems. He/She also has the following acute concerns today:  I have personally reviewed the Medicare Annual Wellness questionnaire and have noted 1. The patient's medical and social history 2. Their use of alcohol, tobacco or illicit drugs 3. Their current medications and supplements 4. The patient's functional ability including ADL's, fall risks, home safety risks and hearing or visual             impairment. 5. Diet and physical activities 6. Evidence for depression or mood disorders 7.         Updated provider list Cognitive evaluation was performed and recorded on pt medicare questionnaire form. The patients weight, height, BMI and visual acuity have been recorded in the chart  I have made referrals, counseling and provided education to the patient based review of the above and I have provided the pt with a written personalized care plan for preventive services.   Documentation of this information was scanned into the electronic record under the media tab.   Autoimmune pancreatitis: 5 mg daily prednisone.Marland Kitchen Has improved symptoms.. Plan is to stay on for 2 years. Has follow up Nov15.   Chronic back pain: recent ESI.   Diabetes:   Worsened control now back on prednisone, despite insulin 14 Units daily.   Wt Readings from Last 3 Encounters:  03/31/18 130 lb 4 oz (59.1 kg)  02/07/18 128 lb 12 oz (58.4 kg)  01/26/18 130 lb (59 kg)   Lab Results  Component Value Date   HGBA1C 10.1 (H) 03/27/2018  Using medications without difficulties: Hypoglycemic episodes: none Hyperglycemic episodes: yes Feet problems: no ulcers Blood Sugars averaging:154-238 eye exam within last year: yesterday  Elevated Cholesterol:  atorvastatin 80 mg daily, welchol on max.. she has been very good  about taking. Lab Results  Component Value Date   CHOL 287 (H) 03/27/2018   HDL 68.10 03/27/2018   LDLCALC 140 (H) 12/09/2017   LDLDIRECT 197.0 03/27/2018   TRIG 245.0 (H) 03/27/2018   CHOLHDL 4 03/27/2018  Using medications without problems: Muscle aches:  Diet compliance: moderate Exercise: walking Other complaints:  Migraine: Followed by Dr. Tomi Likens   Brother with kidney cancer.   Hearing Screening   Method: Audiometry   125Hz  250Hz  500Hz  1000Hz  2000Hz  3000Hz  4000Hz  6000Hz  8000Hz   Right ear:   20 20 20  20     Left ear:   20 20 20  20     Vision Screening Comments: Wears Glasses-Eye Exam 03/30/18 with Dr. Jomarie Longs  Depression screen [facility] 2/9 03/31/2018 05/27/2017 03/01/2017  Decreased Interest 0 0 0  Down, Depressed, Hopeless 0 0 0  PHQ - 2 Score 0 0 0    Advance directives and end of life planning reviewed in detail with patient and documented in EMR. Patient given handout on advance care directives if needed. HCPOA and living will updated if needed.  Fall Risk  03/31/2018 10/12/2017 05/27/2017 05/09/2017 03/01/2017  Falls in the past year? No Yes No No Yes  Number falls in past yr: - 1 - - 1  Injury with Fall? - No - - No  Follow up - - - - -   Social History /Family History/Past Medical History reviewed in detail and updated in EMR if needed. Blood pressure 130/82, pulse 77, temperature 98.4 F (  36.9 C), temperature source Oral, height 5' 0.5" (1.537 m), weight 130 lb 4 oz (59.1 kg).  Review of Systems  Constitutional: Negative for fatigue and fever.  HENT: Negative for congestion.   Eyes: Negative for pain.  Respiratory: Negative for cough and shortness of breath.   Cardiovascular: Negative for chest pain, palpitations and leg swelling.  Gastrointestinal: Negative for abdominal pain.  Genitourinary: Negative for dysuria and vaginal bleeding.  Musculoskeletal: Negative for back pain.  Neurological: Negative for syncope, light-headedness and headaches.    Psychiatric/Behavioral: Negative for dysphoric mood.       Objective:   Physical Exam  Constitutional: Vital signs are normal. She appears well-developed and well-nourished. She is cooperative.  Non-toxic appearance. She does not appear ill. No distress.  HENT:  Head: Normocephalic.  Right Ear: Hearing, tympanic membrane, external ear and ear canal normal.  Left Ear: Hearing, tympanic membrane, external ear and ear canal normal.  Nose: Nose normal.  Eyes: Pupils are equal, round, and reactive to light. Conjunctivae, EOM and lids are normal. Lids are everted and swept, no foreign bodies found.  Neck: Trachea normal and normal range of motion. Neck supple. Carotid bruit is not present. No thyroid mass and no thyromegaly present.  Cardiovascular: Normal rate, regular rhythm, S1 normal, S2 normal, normal heart sounds and intact distal pulses. Exam reveals no gallop.  No murmur heard. Pulmonary/Chest: Effort normal and breath sounds normal. No respiratory distress. She has no wheezes. She has no rhonchi. She has no rales.  Abdominal: Soft. Normal appearance and bowel sounds are normal. She exhibits no distension, no fluid wave, no abdominal bruit and no mass. There is no hepatosplenomegaly. There is no tenderness. There is no rebound, no guarding and no CVA tenderness. No hernia.  Lymphadenopathy:    She has no cervical adenopathy.    She has no axillary adenopathy.  Neurological: She is alert. She has normal strength. No cranial nerve deficit or sensory deficit.  Skin: Skin is warm, dry and intact. No rash noted.  Psychiatric: Her speech is normal and behavior is normal. Judgment normal. Her mood appears not anxious. Cognition and memory are normal. She does not exhibit a depressed mood.    Diabetic foot exam: Normal inspection No skin breakdown No calluses  Normal DP pulses Normal sensation to light touch and monofilament Nails normal       Assessment & Plan:  The patient's  preventative maintenance and recommended screening tests for an annual wellness exam were reviewed in full today. Brought up to date unless services declined.  Counselled on the importance of diet, exercise, and its role in overall health and mortality. The patient's FH and SH was reviewed, including their home life, tobacco status, and drug and alcohol status.   Colonoscopy was normal in 2013, repeat in 10 years.  Nml endoscopy for GI bleed in 12/2014. PAP partial hysterectomy 2001, cervix remains, last pap 2015, no further indicated. No family history of ovarian cancer. Asymptomatic.  Vaccines up-to-date Last DEXA Stable osteopenia, nml vit D. 02/2013 repeat in 5 years.. 03/28/2018 stable osteopenia -1.9 Mammo: nml 01/2018

## 2018-03-31 NOTE — Assessment & Plan Note (Signed)
Poor control.. Will get on low chol diet. If not at goal in 3 months.. Add zetia to atorvastatin and welchol. Has not tolerated crestor in past.

## 2018-03-31 NOTE — Assessment & Plan Note (Signed)
Improved control on prdnsione 5 mg daily.

## 2018-03-31 NOTE — Assessment & Plan Note (Signed)
Needs improved cholesterol control.

## 2018-03-31 NOTE — Addendum Note (Signed)
Addended by: Carter Kitten on: 03/31/2018 09:43 AM   Modules accepted: Orders

## 2018-03-31 NOTE — Patient Instructions (Addendum)
Increase lantus to 20 Units daily.  Follow Fasting blood sugar daily ( goal < 120)...occ check 2 hour post prandial. ( goal < 180).  Work on  low carb and low cholesterol diet.

## 2018-03-31 NOTE — Assessment & Plan Note (Signed)
Good control at home and in office today.

## 2018-04-03 ENCOUNTER — Encounter: Payer: Self-pay | Admitting: Family Medicine

## 2018-06-01 IMAGING — MG 2D DIGITAL SCREENING BILATERAL MAMMOGRAM WITH CAD AND ADJUNCT TO
8 of 12 series · 8 of 28 positions shown · non-contrast
Comparison: Previous exam(s).

CLINICAL DATA: Screening.

EXAM:
2D DIGITAL SCREENING BILATERAL MAMMOGRAM WITH CAD AND ADJUNCT TOMO

[L MLO]
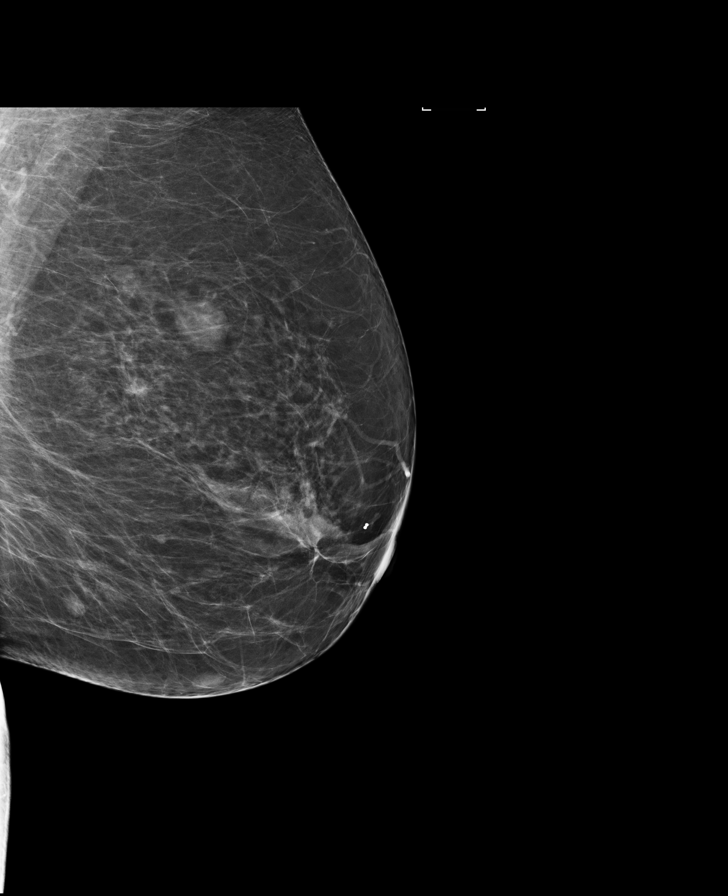

[R MLO]
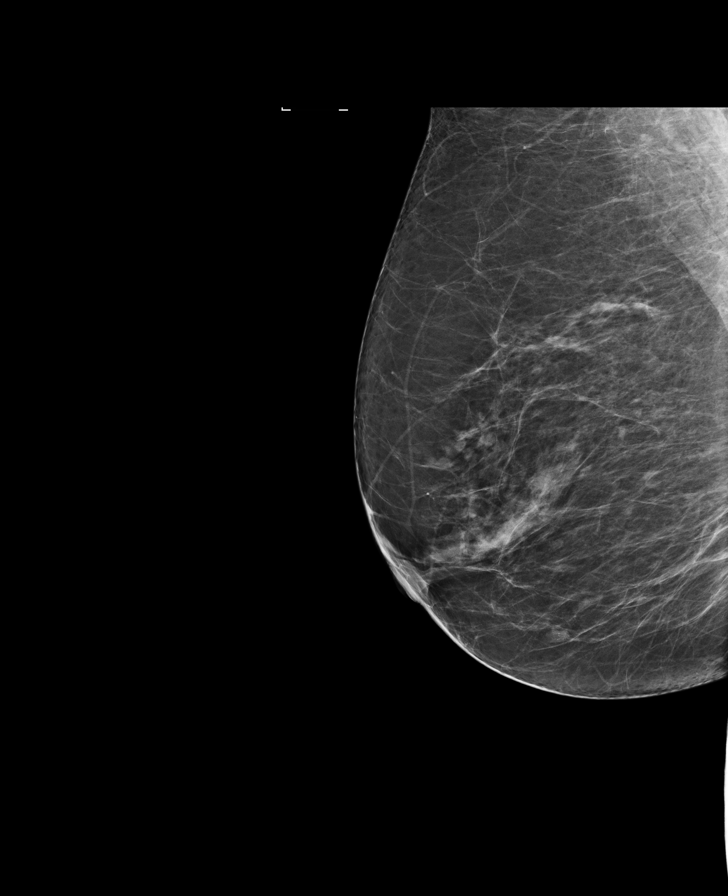

[R CC]
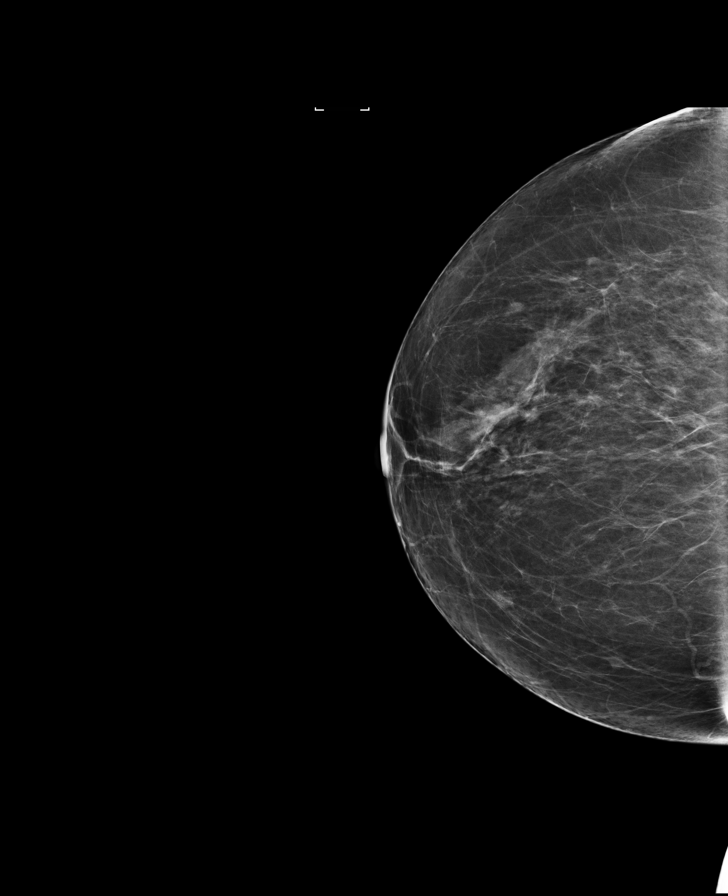

[R CC synth-2D]
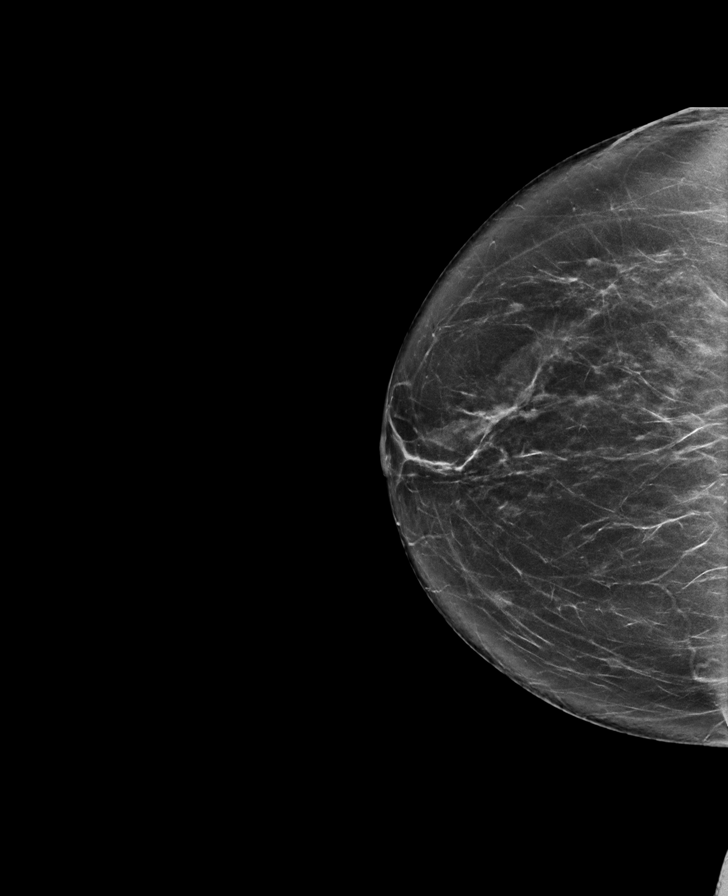

[L CC synth-2D]
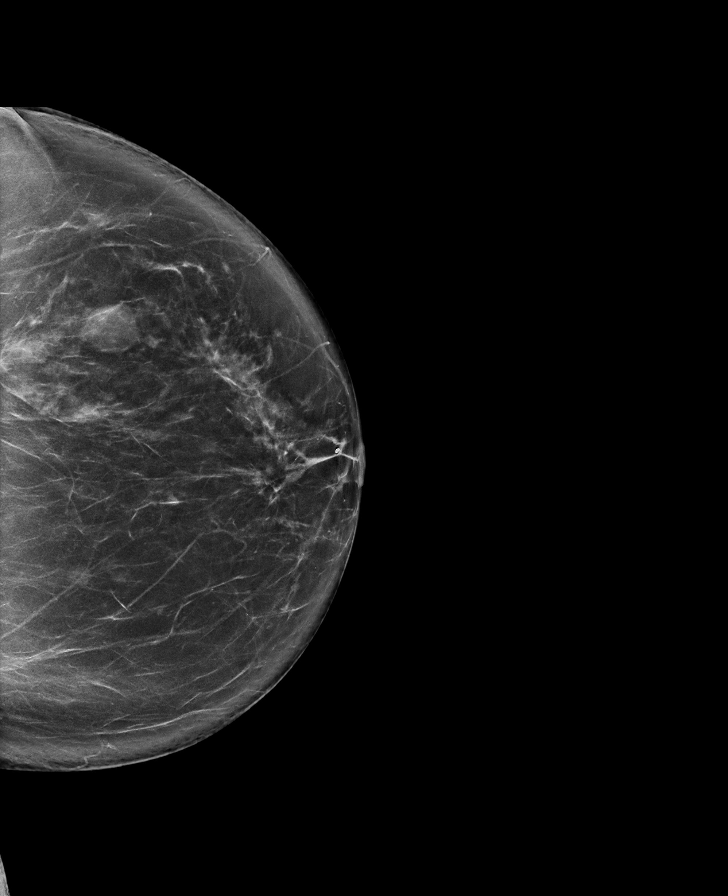

[L CC]
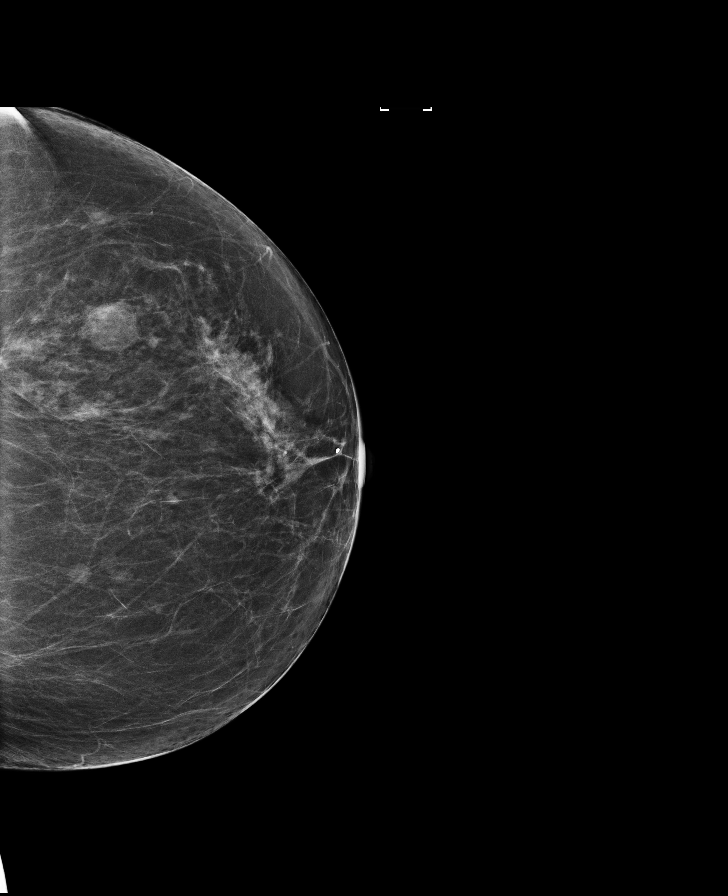

[R MLO synth-2D]
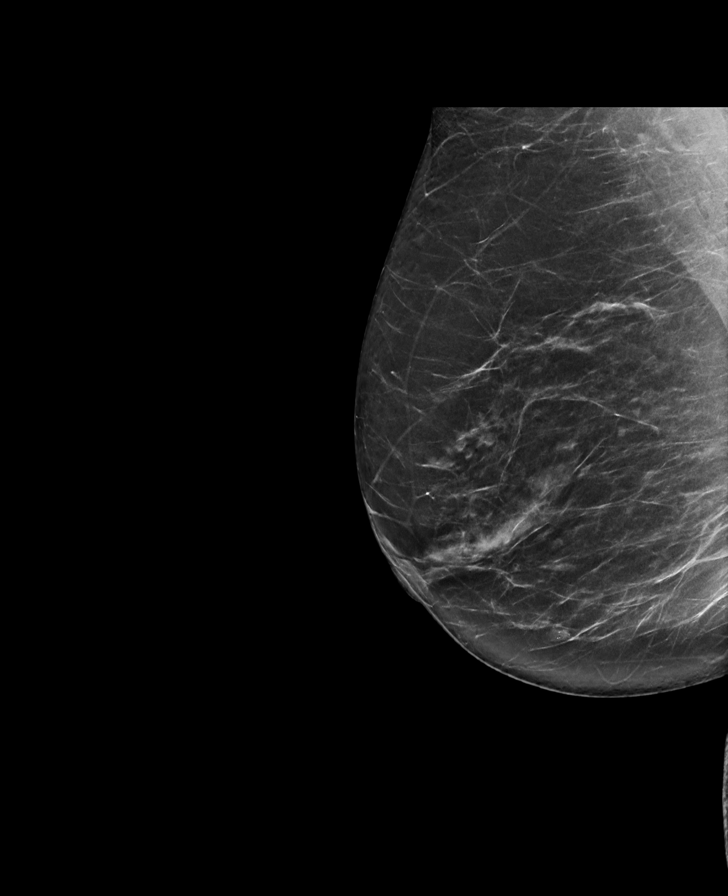

[L MLO synth-2D]
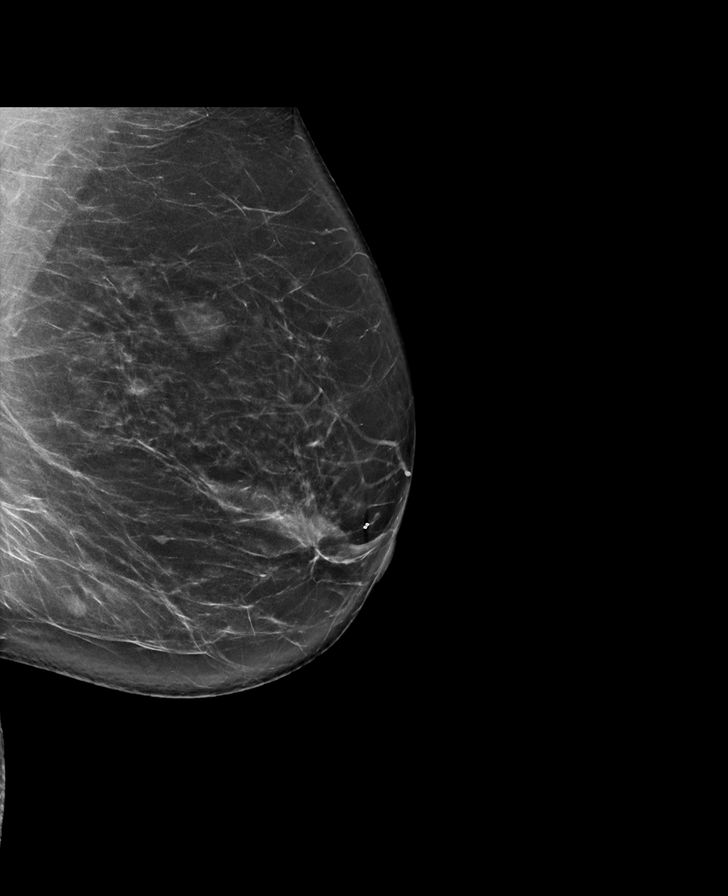

[8 of 28 positions shown; findings below may reference images not displayed]

ACR Breast Density Category b: There are scattered areas of
fibroglandular density.
FINDINGS: There are no findings suspicious for malignancy. Images were
processed with CAD.
IMPRESSION: No mammographic evidence of malignancy. A result letter of this
screening mammogram will be mailed directly to the patient.

RECOMMENDATION:
Screening mammogram in one year. (Code:97-6-RS4)

BI-RADS CATEGORY  1: Negative.

## 2018-06-07 DIAGNOSIS — I82409 Acute embolism and thrombosis of unspecified deep veins of unspecified lower extremity: Secondary | ICD-10-CM

## 2018-06-07 HISTORY — DX: Acute embolism and thrombosis of unspecified deep veins of unspecified lower extremity: I82.409

## 2018-06-09 ENCOUNTER — Other Ambulatory Visit: Payer: Self-pay

## 2018-06-09 MED ORDER — GALCANEZUMAB-GNLM 120 MG/ML ~~LOC~~ SOSY: 120.0000 mg | PREFILLED_SYRINGE | SUBCUTANEOUS | 11 refills | Status: DC

## 2018-06-09 NOTE — Progress Notes (Signed)
Initiated on cover my meds 

## 2018-06-09 NOTE — Progress Notes (Signed)
Received notice via [email or web address] that pt's Emgality 120mg .mL auto-injector has been   Approved thru 09/08/2018

## 2018-06-13 NOTE — Progress Notes (Signed)
Rcvd fax from Pt's pharmacy concerning PA, called and LMOVM advising has been approved

## 2018-06-26 ENCOUNTER — Other Ambulatory Visit (INDEPENDENT_AMBULATORY_CARE_PROVIDER_SITE_OTHER): Payer: Medicare Other

## 2018-06-26 DIAGNOSIS — E785 Hyperlipidemia, unspecified: Secondary | ICD-10-CM

## 2018-06-26 DIAGNOSIS — E119 Type 2 diabetes mellitus without complications: Secondary | ICD-10-CM

## 2018-06-26 DIAGNOSIS — Z79899 Other long term (current) drug therapy: Secondary | ICD-10-CM | POA: Diagnosis not present

## 2018-06-26 LAB — LIPID PANEL
Cholesterol: 263 mg/dL — ABNORMAL HIGH (ref 0–200)
HDL: 75.7 mg/dL (ref >39.00)
LDL Cholesterol: 164 mg/dL — ABNORMAL HIGH (ref 0–99)
NonHDL: 187.14
Total CHOL/HDL Ratio: 3
Triglycerides: 118 mg/dL (ref 0.0–149.0)
VLDL: 23.6 mg/dL (ref 0.0–40.0)

## 2018-06-26 LAB — BASIC METABOLIC PANEL
BUN: 16 mg/dL (ref 6–23)
CO2: 31 mEq/L (ref 19–32)
Calcium: 9.4 mg/dL (ref 8.4–10.5)
Chloride: 104 mEq/L (ref 96–112)
Creatinine, Ser: 1.06 mg/dL (ref 0.40–1.20)
GFR: 51.68 mL/min — ABNORMAL LOW (ref >60.00)
Glucose, Bld: 84 mg/dL (ref 70–99)
Potassium: 4.1 mEq/L (ref 3.5–5.1)
Sodium: 144 mEq/L (ref 135–145)

## 2018-06-26 LAB — HEPATIC FUNCTION PANEL
ALT: 16 U/L (ref 0–35)
AST: 16 U/L (ref 0–37)
Albumin: 3.8 g/dL (ref 3.5–5.2)
Alkaline Phosphatase: 60 U/L (ref 39–117)
Bilirubin, Direct: 0.1 mg/dL (ref 0.0–0.3)
Total Bilirubin: 0.4 mg/dL (ref 0.2–1.2)
Total Protein: 6.5 g/dL (ref 6.0–8.3)

## 2018-06-26 LAB — HEMOGLOBIN A1C: Hgb A1c MFr Bld: 7.9 % — ABNORMAL HIGH (ref 4.6–6.5)

## 2018-06-27 NOTE — Progress Notes (Signed)
NEUROLOGY FOLLOW UP OFFICE NOTE  Debra Leach [id number]  HISTORY OF PRESENT ILLNESS: Debra Leach is a 68 year old right-handed female with autoimmune pancreatitis, migraines, hyperlipidemia, diabetes, GERD, osteoarthritis, and history of MVP and renal stones who follows up for migraines.  UPDATE: Since last visit, Aimovig has been switched to [facility] due to increased headaches.  Second dose this month.   Intensity:  Usually 2-3/10, 4 days 7-8/10. Duration:  Usually 3-4 hours, but 5 days lasted 7-8 hours) Frequency: in January thus far (21 days; 12 days 2-3/10, 4 days 7-8/10) Frequency of abortive medication: sumatriptan 2 days a week maximum (only uses it for 8-9/10) Current NSAIDS/steroid: budesomide 9mg  daily Current analgesics: None Current triptans: Sumatriptan 6 mg  Current ergotamine: None Current anti-emetic: Promethazine 12.5 mg Current muscle relaxants: None Current anti-anxiolytic: None Current sleep aide: None Current Antihypertensive medications: None Current Antidepressant medications: None Current Anticonvulsant medications: Gabapentin 600mg  twice daily Current anti-CGRP: Emgality Current Vitamins/Herbal/Supplements: Riboflavin 400 mg daily, vitamin D Current Antihistamines/Decongestants: None Other therapy: Cefaly, ice pack Other medication: Meclizine  Caffeine: 1 cup of coffee daily Alcohol: None Smoker: No Diet: Follows LEAP ImmunoCalm Dietary Program.  Hydrates. Exercise: When tolerated (knee problems).  She walks and bikes. Depression: No; Anxiety: No Other pain:  no Sleep hygiene: Improved  HISTORY:  Onset: 68 years old Location:Varies (unilateral either side, frontal-temporal, back of head) Quality:Varies (stabbing, pounding, throbbing) Initial Intensity:10/10 severe, otherwise 5-7/10 Aura:Occurs off and on over the years and varies in semiology.In early 1970s, black out.In late 1980s, white out.In late 1990s, flashing  lights and zigzag lines.Since 2016, scintillating scotoma (occurs 1 to 2 times a month) Prodrome:no Associated symptoms: Initially vomiting.Sometimes nausea.Photophobia, phonophobia.Dizziness. Initial Duration:All day Initial Frequency:daily Triggers: Change in weather, emotional stress, valsalva maneuver, odors, certain foods Relieving factors: ice, rest Activity:Difficult to function if severe  Past NSAIDS:diclofenac 50mg , Cambia, Mobic 15mg , ibuprofen, naproxen, toradol  Past analgesics:tramadol (reaction), Midrin, Excedrin Past abortive triptans/ergots:Treximet, Axert, Amerge, Frova, Maxalt, Relpax, Zomig tablet, DHE NS, sumatriptan 100mg  Past anti-emetic:  Zofran 4mg  Past anxiolytic:Buspirone, clonazepam Past antihypertensive medications:Metoprolol, Norvasc, propranolol ER 120mg , atenolol 100mg  (side effects dizziness, myalgias, acid reflux) Past antidepressant medications:Venlafaxine, sertraline, amitriptyline, amoxapine, duloxetine, nortriptyline, imipramine, Luvox, Remeron, Serzone, bupropion, desipramine, doxepin, Vivactil Past anticonvulsant medications:Depakote, Topamax, zonisamide, Lamictal, Keppra, Lyrica, gabapentin 300/300/600  Past anti--CGRP: Aimovig Past vitamins/Herbal/Supplements:butterbur, Mg, CoQ10 Other past treatments:Botox (2 years), Cefaly Device, methylergonovine, acupuncture, biofeedback, Seroquel, Thorazine, methylsergide maleate  Family history of headache:Maybe her mother's side.  Prior brain MRI normal (date unknown but report is mentioned in prior notes).  Abnormal Movements: In December 2017, we started atenolol for migraine prevention. It worked well, but due to reported side effects of dizziness, acid reflux and joint and muscle pain, she was tapered off of it in early April. Around that time, she developed shaking episodes or tremors associated with her migraines. She followed up with her PCP, Dr. Diona Leach, on  10/08/16 and we had her discontinue the gabapentin. She hasn't had any recurrent tremors, however she says she has gone up to 4 days at a time without an attack. Since discontinuing the gabapentin, the intensity of her daily headaches have gotten worse.   I reviewed the video of her habitual attacks. In the video, she exhibits flapping of her right hand, arrhythmic and without tremor or choreiform movement. It is not an arrhythmic jerking, consistent with myoclonus. It sometimes involves the left hand as well. She denies stress and anxiety.  PAST MEDICAL HISTORY: Past Medical History:  Diagnosis  Date  . Arthritis    osteoarthritis B knees, left hip , right elbow  . Complication of anesthesia    difficult waking  . Diabetes mellitus without complication ([place])    Diet and exercise controlled  . GERD (gastroesophageal reflux disease)   . Headache   . History of kidney stones   . Hyperlipidemia   . MVP (mitral valve prolapse)    history of  . PONV (postoperative nausea and vomiting)     MEDICATIONS: Current Outpatient Medications on File Prior to Visit  Medication Sig Dispense Refill  . atorvastatin (LIPITOR) 80 MG tablet Take 1 tablet (80 mg total) by mouth daily. 90 tablet 3  . cholecalciferol (VITAMIN D) 1000 units tablet Take 1,000 Units by mouth daily.    . clotrimazole-betamethasone (LOTRISONE) cream APPLY TO AFFECTED AREA(S) AS NEEDED 30 g 0  . colesevelam (WELCHOL) 625 MG tablet Take 3 tablets (1,875 mg total) by mouth 2 (two) times daily with a meal. 540 tablet 3  . Erenumab-aooe (AIMOVIG 140 DOSE) 70 MG/ML SOAJ Inject 140 mg into the skin every 30 (thirty) days. 1 pen 11  . gabapentin (NEURONTIN) 300 MG capsule TAKE 2 CAPSULES BY MOUTH 2 TIMES A DAY. 120 capsule 3  . Galcanezumab-gnlm (EMGALITY) 120 MG/ML SOSY Inject 120 mg into the skin every 30 (thirty) days. 1 Syringe 11  . glucose blood (ONETOUCH VERIO) test strip Use to check blood sugar two times a day 100 each 12    . Insulin Glargine (LANTUS SOLOSTAR) 100 UNIT/ML Solostar Pen Inject 20 Units into the skin daily.     . Insulin Pen Needle 29G X 12.7MM MISC by Does not apply route. Use to inject insulin daily.  Dx: E11.9    . Lancets (ONETOUCH ULTRASOFT) lancets Use to check blood sugar two times daily 100 each 12  . omeprazole (PRILOSEC) 20 MG capsule Take 1 capsule (20 mg total) by mouth 2 (two) times daily before a meal. 180 capsule 3  . predniSONE (DELTASONE) 5 MG tablet Take 5 mg by mouth daily.  1  . promethazine (PHENERGAN) 12.5 MG tablet Take 12.5 mg by mouth every 6 (six) hours as needed for nausea or vomiting.    . Riboflavin (VITAMIN B2 PO) Take 400 mg by mouth daily.    . SUMAtriptan (IMITREX) 6 MG/0.5ML SOLN injection Inject 6 mg into the skin every 2 (two) hours as needed for migraine or headache. May repeat in 2 hours if headache persists or recurs.     No current facility-administered medications on file prior to visit.     ALLERGIES: Allergies  Allergen Reactions  . Atenolol   . Codeine     REACTION: Migraine  . Penicillins Itching    Has patient had a PCN reaction causing immediate rash, facial/tongue/throat swelling, SOB or lightheadedness with hypotension: No Has patient had a PCN reaction causing severe rash involving mucus membranes or skin necrosis: No Has patient had a PCN reaction that required hospitalization: No Has patient had a PCN reaction occurring within the last 10 years: No If all of the above answers are "NO", then may proceed with Cephalosporin use.   Marland Kitchen Pentazocine Lactate     REACTION: Swelling, itching, rash  . Crestor [Rosuvastatin] Other (See Comments)  . Sudafed [Pseudoephedrine Hcl] Itching and Anxiety    FAMILY HISTORY: Family History  Problem Relation Age of Onset  . Cancer Mother        bone  . Hypertension Father   . Mitral  valve prolapse Father   . Asthma Brother   . Arthritis Brother   . Nephrolithiasis Brother   . Nephrolithiasis Brother    . Asthma Brother   . Arthritis Brother   . Aortic aneurysm Brother        ascending aortic aneuysm  . Breast cancer Maternal Aunt   . Breast cancer Maternal Aunt    SOCIAL HISTORY: Social History   Socioeconomic History  . Marital status: Married    Spouse name: Not on file  . Number of children: 0  . Years of education: Not on file  . Highest education level: Not on file  Occupational History  . Occupation: retired Energy manager: retired  Scientific laboratory technician  . Financial resource strain: Not on file  . Food insecurity:    Worry: Not on file    Inability: Not on file  . Transportation needs:    Medical: Not on file    Non-medical: Not on file  Tobacco Use  . Smoking status: Former Research scientist (life sciences)  . Smokeless tobacco: Never Used  Substance and Sexual Activity  . Alcohol use: No    Frequency: Never  . Drug use: No  . Sexual activity: Not on file  Lifestyle  . Physical activity:    Days per week: Not on file    Minutes per session: Not on file  . Stress: Not on file  Relationships  . Social connections:    Talks on phone: Not on file    Gets together: Not on file    Attends religious service: Not on file    Active member of club or organization: Not on file    Attends meetings of clubs or organizations: Not on file    Relationship status: Not on file  . Intimate partner violence:    Fear of current or ex partner: Not on file    Emotionally abused: Not on file    Physically abused: Not on file    Forced sexual activity: Not on file  Other Topics Concern  . Not on file  Social History Narrative   Regular exercise--yes, recumbent bike 3-4 days a week      Diet: fruits and veggies, water, eats at home, drinks a lot of milk    REVIEW OF SYSTEMS: Constitutional: No fevers, chills, or sweats, no generalized fatigue, change in appetite Eyes: No visual changes, double vision, eye pain Ear, nose and throat: No hearing loss, ear pain, nasal congestion, sore  throat Cardiovascular: No chest pain, palpitations Respiratory:  No shortness of breath at rest or with exertion, wheezes GastrointestinaI: No nausea, vomiting, diarrhea, abdominal pain, fecal incontinence Genitourinary:  No dysuria, urinary retention or frequency Musculoskeletal:  No neck pain, back pain Integumentary: No rash, pruritus, skin lesions Neurological: as above Psychiatric: No depression, insomnia, anxiety Endocrine: No palpitations, fatigue, diaphoresis, mood swings, change in appetite, change in weight, increased thirst Hematologic/Lymphatic:  No purpura, petechiae. Allergic/Immunologic: no itchy/runny eyes, nasal congestion, recent allergic reactions, rashes  PHYSICAL EXAM: Blood pressure (!) 154/84, pulse 89, height 5' 0.5" (1.537 m), weight 143 lb (64.9 kg), SpO2 98 %. General: No acute distress.  Patient appears well-groomed.   Head:  Normocephalic/atraumatic Eyes:  Fundi examined but not visualized Neck: supple, no paraspinal tenderness, full range of motion Heart:  Regular rate and rhythm Lungs:  Clear to auscultation bilaterally Back: No paraspinal tenderness Neurological Exam: alert and oriented to person, place, and time. Attention span and concentration intact, recent and remote memory  intact, fund of knowledge intact.  Speech fluent and not dysarthric, language intact.  CN II-XII intact. Bulk and tone normal, muscle strength 5/5 throughout.  Sensation to light touch intact.  Deep tendon reflexes 2+ throughout, toes downgoing.  Finger to nose and heel to shin testing intact.  Gait normal, Romberg negative.  IMPRESSION: Chronic migraine without aura, without status migrainosus, not intractable  PLAN: 1.  For preventative management, continue Emgality.  If no further improvement or if headaches worsen, consider adding an oral preventative (even one she previously used as monotherapy). 2.  For abortive therapy, sumatriptan [place] (limit to no more than 2 days out of  week) 3.  Limit use of pain relievers to no more than 2 days out of week to prevent risk of rebound or medication-overuse headache. 4.  Keep headache diary 5.  Exercise, hydration, caffeine cessation, sleep hygiene, monitor for and avoid triggers 6.  Consider:  magnesium citrate 400mg  daily, riboflavin 400mg  daily, and coenzyme Q10 100mg  three times daily 7.  Follow up in 4 months.  Debra Clines, DO  CC: Eliezer Lofts, MD

## 2018-06-28 ENCOUNTER — Encounter: Payer: Self-pay | Admitting: Neurology

## 2018-06-28 ENCOUNTER — Ambulatory Visit (INDEPENDENT_AMBULATORY_CARE_PROVIDER_SITE_OTHER): Payer: Medicare Other | Admitting: Neurology

## 2018-06-28 ENCOUNTER — Encounter: Payer: Self-pay | Admitting: *Deleted

## 2018-06-28 VITALS — BP 154/84 | HR 89 | Ht 60.5 in | Wt 143.0 lb

## 2018-06-28 DIAGNOSIS — G43709 Chronic migraine without aura, not intractable, without status migrainosus: Secondary | ICD-10-CM

## 2018-06-28 MED ORDER — GALCANEZUMAB-GNLM 120 MG/ML ~~LOC~~ SOSY: 120.0000 mg | PREFILLED_SYRINGE | SUBCUTANEOUS | 0 refills | Status: DC

## 2018-06-28 MED ORDER — GABAPENTIN 300 MG PO CAPS: 300.0000 mg | ORAL_CAPSULE | Freq: Three times a day (TID) | ORAL | 3 refills | Status: DC

## 2018-06-28 MED ORDER — SUMATRIPTAN SUCCINATE 6 MG/0.5ML ~~LOC~~ SOLN: 6.0000 mg | SUBCUTANEOUS | 5 refills | Status: DC | PRN

## 2018-06-28 NOTE — Patient Instructions (Signed)
1.  Continue Emgality 2.  Use sumatriptan shot as needed, limited to no more than 2 days out of week 3.  Follow up in 4 months.

## 2018-06-28 NOTE — Addendum Note (Signed)
Addended by: Clois Comber on: 06/28/2018 03:54 PM   Modules accepted: Orders

## 2018-06-29 ENCOUNTER — Ambulatory Visit (INDEPENDENT_AMBULATORY_CARE_PROVIDER_SITE_OTHER): Payer: Medicare Other | Admitting: Family Medicine

## 2018-06-29 ENCOUNTER — Encounter: Payer: Self-pay | Admitting: Family Medicine

## 2018-06-29 VITALS — BP 130/70 | HR 80 | Temp 98.5°F | Ht 60.5 in | Wt 143.2 lb

## 2018-06-29 DIAGNOSIS — R399 Unspecified symptoms and signs involving the genitourinary system: Secondary | ICD-10-CM | POA: Diagnosis not present

## 2018-06-29 DIAGNOSIS — K861 Other chronic pancreatitis: Secondary | ICD-10-CM | POA: Diagnosis not present

## 2018-06-29 DIAGNOSIS — E119 Type 2 diabetes mellitus without complications: Secondary | ICD-10-CM | POA: Diagnosis not present

## 2018-06-29 DIAGNOSIS — E785 Hyperlipidemia, unspecified: Secondary | ICD-10-CM | POA: Diagnosis not present

## 2018-06-29 LAB — HM DIABETES FOOT EXAM

## 2018-06-29 MED ORDER — INSULIN GLARGINE 100 UNIT/ML SOLOSTAR PEN: 25.0000 [IU] | PEN_INJECTOR | Freq: Every day | SUBCUTANEOUS | 11 refills | Status: DC

## 2018-06-29 NOTE — Progress Notes (Signed)
Subjective:    Patient ID: Debra Leach, female    DOB: [birth year], 68 y.o.   MRN: [id number]  HPI  68 year old female presents for  DM follow up.  Diabetes:  Significant improvement in last 3 months, but not yet at goal.  ON lantus 25 units daily  ON budesonide long term for pancreas. Lab Results  Component Value Date   HGBA1C 7.9 (H) 06/26/2018  Using medications without difficulties: Hypoglycemic episodes:none Hyperglycemic episodes:none Feet problems: no ulcer Blood Sugars averaging: FBS  87-125... highest 166 if eats poorly eye exam within last year:03/2018  Elevated Cholesterol: LDL far from goal but  30 points less than at last OV.  Pt on atorvastatin 80 mg daily and welchol Lab Results  Component Value Date   CHOL 263 (H) 06/26/2018   HDL 75.70 06/26/2018   LDLCALC 164 (H) 06/26/2018   LDLDIRECT 197.0 03/27/2018   TRIG 118.0 06/26/2018   CHOLHDL 3 06/26/2018  Using medications without problems: Muscle aches:  Diet compliance: good Exercise: walking when at beach.. walking 1-2 hours a day. Other complaints:   Dr. Earlean Shawl following autoimmune pancreatitis.   CT scan in 07/2017.. showed renal lesions.. 09/2017 described as scarring. First CT showed ? Lung scarring, second read as normal.  Remote smoking history    Brother with kidney disease.[name].  Social History /Family History/Past Medical History reviewed in detail and updated in EMR if needed. Blood pressure 130/70, pulse 80, temperature 98.5 F (36.9 C), temperature source Oral, height 5' 0.5" (1.537 m), weight 143 lb 4 oz (65 kg).  Review of Systems  Constitutional: Negative for fatigue and fever.  HENT: Negative for congestion.   Eyes: Negative for pain.  Respiratory: Negative for cough and shortness of breath.   Cardiovascular: Negative for chest pain, palpitations and leg swelling.  Gastrointestinal: Negative for abdominal pain.  Genitourinary: Negative for dysuria and vaginal bleeding.   Musculoskeletal: Negative for back pain.  Neurological: Negative for syncope, light-headedness and headaches.  Psychiatric/Behavioral: Negative for dysphoric mood.       Objective:   Physical Exam Constitutional:      General: She is not in acute distress.    Appearance: Normal appearance. She is well-developed. She is not ill-appearing or toxic-appearing.  HENT:     Head: Normocephalic.     Right Ear: Hearing, tympanic membrane, ear canal and external ear normal. Tympanic membrane is not erythematous, retracted or bulging.     Left Ear: Hearing, tympanic membrane, ear canal and external ear normal. Tympanic membrane is not erythematous, retracted or bulging.     Nose: No mucosal edema or rhinorrhea.     Right Sinus: No maxillary sinus tenderness or frontal sinus tenderness.     Left Sinus: No maxillary sinus tenderness or frontal sinus tenderness.     Mouth/Throat:     Pharynx: Uvula midline.  Eyes:     General: Lids are normal. Lids are everted, no foreign bodies appreciated.     Conjunctiva/sclera: Conjunctivae normal.     Pupils: Pupils are equal, round, and reactive to light.  Neck:     Musculoskeletal: Normal range of motion and neck supple.     Thyroid: No thyroid mass or thyromegaly.     Vascular: No carotid bruit.     Trachea: Trachea normal.  Cardiovascular:     Rate and Rhythm: Normal rate and regular rhythm.     Pulses: Normal pulses.     Heart sounds: Normal heart sounds, S1 normal  and S2 normal. No murmur. No friction rub. No gallop.   Pulmonary:     Effort: Pulmonary effort is normal. No tachypnea or respiratory distress.     Breath sounds: Normal breath sounds. No decreased breath sounds, wheezing, rhonchi or rales.  Abdominal:     General: Bowel sounds are normal.     Palpations: Abdomen is soft.     Tenderness: There is no abdominal tenderness.  Skin:    General: Skin is warm and dry.     Findings: No rash.  Neurological:     Mental Status: She is  alert.  Psychiatric:        Mood and Affect: Mood is not anxious or depressed.        Speech: Speech normal.        Behavior: Behavior normal. Behavior is cooperative.        Thought Content: Thought content normal.        Judgment: Judgment normal.      Diabetic foot exam: Normal inspection No skin breakdown No calluses  Normal DP pulses Normal sensation to light touch and monofilament Nails normal      Assessment & Plan:

## 2018-06-29 NOTE — Assessment & Plan Note (Signed)
Improved on budesonide per symptoms report today.

## 2018-06-29 NOTE — Assessment & Plan Note (Signed)
Eval further with renal US given kidney problems in brother.

## 2018-06-29 NOTE — Patient Instructions (Addendum)
We will call to set up renal US to eval.

## 2018-06-29 NOTE — Assessment & Plan Note (Signed)
IMproved control but not at goal.. on statin and welchol. Encouraged exercise, weight loss, healthy eating habits.

## 2018-06-29 NOTE — Assessment & Plan Note (Signed)
Improved control on lantus 25 units... continue for now. Follow up in 3 months.

## 2018-07-06 ENCOUNTER — Ambulatory Visit
Admission: RE | Admit: 2018-07-06 | Discharge: 2018-07-06 | Disposition: A | Discharge status: Home / Self Care | Payer: Medicare Other | Source: Ambulatory Visit | Attending: Family Medicine | Admitting: Family Medicine

## 2018-07-06 DIAGNOSIS — R399 Unspecified symptoms and signs involving the genitourinary system: Secondary | ICD-10-CM | POA: Insufficient documentation

## 2018-07-06 DIAGNOSIS — N200 Calculus of kidney: Secondary | ICD-10-CM | POA: Insufficient documentation

## 2018-08-02 ENCOUNTER — Other Ambulatory Visit: Payer: Self-pay | Admitting: Neurology

## 2018-08-02 MED ORDER — PREDNISONE 10 MG PO TABS: ORAL_TABLET | ORAL | 0 refills | Status: DC

## 2018-08-02 NOTE — Progress Notes (Signed)
Prednisone taper for intractable migraine sent to [facility]

## 2018-08-14 ENCOUNTER — Other Ambulatory Visit: Payer: Self-pay

## 2018-08-14 DIAGNOSIS — G43709 Chronic migraine without aura, not intractable, without status migrainosus: Secondary | ICD-10-CM

## 2018-08-14 MED ORDER — FREMANEZUMAB-VFRM 225 MG/1.5ML ~~LOC~~ SOSY: 225.0000 mg | PREFILLED_SYRINGE | SUBCUTANEOUS | 11 refills | Status: DC

## 2018-08-14 NOTE — Telephone Encounter (Signed)
Just received request for Ajovy PA.  Should I disregard or complete?

## 2018-08-16 NOTE — Progress Notes (Signed)
Rcvd fax from [employer], Emgality has been approved through 06/07/2019

## 2018-08-17 ENCOUNTER — Encounter: Payer: Self-pay | Admitting: *Deleted

## 2018-08-17 NOTE — Progress Notes (Unsigned)
Submitted request for Botox via cover my meds key: [id number] on 08/17/2018

## 2018-08-21 NOTE — Progress Notes (Unsigned)
Rcvd approval from [employer]. [id number] through 11/17/2018 Buy and bill

## 2018-08-25 ENCOUNTER — Other Ambulatory Visit: Payer: Self-pay

## 2018-08-25 ENCOUNTER — Ambulatory Visit (INDEPENDENT_AMBULATORY_CARE_PROVIDER_SITE_OTHER): Payer: Medicare Other | Admitting: Neurology

## 2018-08-25 DIAGNOSIS — G43709 Chronic migraine without aura, not intractable, without status migrainosus: Secondary | ICD-10-CM

## 2018-08-25 MED ORDER — ONABOTULINUMTOXINA 100 UNITS IJ SOLR
155.0000 [IU] | Freq: Once | INTRAMUSCULAR | Status: AC
  Administered 2018-08-25: 155 [IU] via INTRAMUSCULAR

## 2018-08-25 NOTE — Progress Notes (Signed)
[facility]   Procedure Note Botox  Attending: Dr. Tomi Likens  Preoperative Diagnosis(es): Chronic migraine  Consent obtained from: The patient. Benefits discussed included, but were not limited to decreased muscle tightness, increased joint range of motion, and decreased pain.  Risk discussed included, but were not limited pain and discomfort, bleeding, bruising, excessive weakness, venous thrombosis, muscle atrophy and dysphagia.  Anticipated outcomes of the procedure as well as he risks and benefits of the alternatives to the procedure, and the roles and tasks of the personnel to be involved, were discussed with the patient, and the patient consents to the procedure and agrees to proceed. A copy of the patient medication guide was given to the patient which explains the blackbox warning.  Patients identity and treatment sites confirmed:  Yes.  Details of Procedure: Skin was cleaned with alcohol. Prior to injection, the needle plunger was aspirated to make sure the needle was not within a blood vessel.  There was no blood retrieved on aspiration.    Following is a summary of the muscles injected  And the amount of Botulinum toxin used:  Dilution 200 units of Botox was reconstituted with 4 ml of preservative free normal saline. Time of reconstitution: At the time of the office visit (<30 minutes prior to injection)   Injections  155 total units of Botox was injected with a 30 gauge needle.  Injection Sites: L occipitalis: 15 units- 3 sites  R occiptalis: 15 units- 3 sites  L upper trapezius: 15 units- 3 sites R upper trapezius: 15 units- 3 sits          L paraspinal: 10 units- 2 sites R paraspinal: 10 units- 2 sites  Face L frontalis(2 injection sites):10 units   R frontalis(2 injection sites):10 units         L corrugator: 5 units   R corrugator: 5 units           Procerus: 5 units   L temporalis: 20 units R temporalis: 20 units   Agent:  200 units of botulinum Type A  (Onobotulinum Toxin type A) was reconstituted with 4 ml of preservative free normal saline.  Time of reconstitution: At the time of the office visit (<30 minutes prior to injection)     Total injected (Units): 155  Total wasted (Units): 45  Patient tolerated procedure well without complications.   Reinjection is anticipated in 3 months. Return to clinic in 4.5 months.

## 2018-09-11 ENCOUNTER — Other Ambulatory Visit: Payer: Self-pay | Admitting: Neurology

## 2018-09-12 ENCOUNTER — Other Ambulatory Visit: Payer: Self-pay

## 2018-09-12 MED ORDER — GABAPENTIN 300 MG PO CAPS: 600.0000 mg | ORAL_CAPSULE | Freq: Two times a day (BID) | ORAL | 5 refills | Status: DC

## 2018-09-21 ENCOUNTER — Encounter: Payer: Self-pay | Admitting: *Deleted

## 2018-09-21 NOTE — Progress Notes (Signed)
Copy of prior auth To: Metta Clines From: [employer] Phone: [phone] Reference #: [id number] RE: Prior Authorization Request Patient Name: Debra Leach Patient DOB: 02-03-1951 Patient ID: [id number] Decision Made: Florentina Jenny Medication Name: Emgality Inj 120mg /Ml GPI/NDC: [id number] D520 Decision Notes: EMGALITY INJ 120MG /ML, use as directed, is approved through 06/07/2019 under your Medicare Part D benefit. Reviewed by: System If the treating physician would like to discuss this coverage decision with the physician or health care professional reviewer, please call [employer] Prior Authorization department at [phone]. Going forward, submit your prior authorization requests to [employer] electronically. It's easy to get started with electronic Prior Authorization (ePA) by using one of the online portals on the [employer] website at: [email or web address] > Prior Authorizations > Submit a prior authorization. If you cannot submit electronically, or if you have questions, please call the [employer] Prior Authorization team at [phone].

## 2018-10-03 ENCOUNTER — Other Ambulatory Visit: Payer: Self-pay

## 2018-10-06 ENCOUNTER — Ambulatory Visit: Payer: Self-pay | Admitting: Family Medicine

## 2018-10-06 ENCOUNTER — Telehealth: Payer: Self-pay | Admitting: Family Medicine

## 2018-10-06 DIAGNOSIS — E119 Type 2 diabetes mellitus without complications: Secondary | ICD-10-CM

## 2018-10-06 NOTE — Telephone Encounter (Signed)
-----   Message from Cloyd Stagers, RT sent at 10/04/2018  9:07 AM EDT ----- Regarding: Labs for Monday 5.4.2020 Please place lab orders for Monday 5.4.2020, Doxy[facility]me visit on Friday 5.8.2020 Thank you, Dyke Maes RT(R)

## 2018-10-09 ENCOUNTER — Other Ambulatory Visit: Payer: Self-pay

## 2018-10-09 ENCOUNTER — Other Ambulatory Visit (INDEPENDENT_AMBULATORY_CARE_PROVIDER_SITE_OTHER): Payer: Medicare Other

## 2018-10-09 DIAGNOSIS — E119 Type 2 diabetes mellitus without complications: Secondary | ICD-10-CM | POA: Diagnosis not present

## 2018-10-09 LAB — LIPID PANEL
Cholesterol: 242 mg/dL — ABNORMAL HIGH (ref 0–200)
HDL: 64 mg/dL (ref >39.00)
LDL Cholesterol: 147 mg/dL — ABNORMAL HIGH (ref 0–99)
NonHDL: 178.23
Total CHOL/HDL Ratio: 4
Triglycerides: 156 mg/dL — ABNORMAL HIGH (ref 0.0–149.0)
VLDL: 31.2 mg/dL (ref 0.0–40.0)

## 2018-10-09 LAB — HEMOGLOBIN A1C: Hgb A1c MFr Bld: 7.8 % — ABNORMAL HIGH (ref 4.6–6.5)

## 2018-10-09 LAB — COMPREHENSIVE METABOLIC PANEL
ALT: 24 U/L (ref 0–35)
AST: 20 U/L (ref 0–37)
Albumin: 3.7 g/dL (ref 3.5–5.2)
Alkaline Phosphatase: 72 U/L (ref 39–117)
BUN: 16 mg/dL (ref 6–23)
CO2: 29 mEq/L (ref 19–32)
Calcium: 9 mg/dL (ref 8.4–10.5)
Chloride: 105 mEq/L (ref 96–112)
Creatinine, Ser: 1.02 mg/dL (ref 0.40–1.20)
GFR: 53.98 mL/min — ABNORMAL LOW (ref >60.00)
Glucose, Bld: 91 mg/dL (ref 70–99)
Potassium: 4 mEq/L (ref 3.5–5.1)
Sodium: 142 mEq/L (ref 135–145)
Total Bilirubin: 0.3 mg/dL (ref 0.2–1.2)
Total Protein: 6.7 g/dL (ref 6.0–8.3)

## 2018-10-10 ENCOUNTER — Ambulatory Visit: Payer: Self-pay | Admitting: Neurology

## 2018-10-13 ENCOUNTER — Ambulatory Visit (INDEPENDENT_AMBULATORY_CARE_PROVIDER_SITE_OTHER): Payer: Medicare Other | Admitting: Family Medicine

## 2018-10-13 ENCOUNTER — Encounter: Payer: Self-pay | Admitting: Family Medicine

## 2018-10-13 VITALS — Ht 60.5 in | Wt 141.0 lb

## 2018-10-13 DIAGNOSIS — E119 Type 2 diabetes mellitus without complications: Secondary | ICD-10-CM

## 2018-10-13 DIAGNOSIS — K861 Other chronic pancreatitis: Secondary | ICD-10-CM

## 2018-10-13 DIAGNOSIS — E785 Hyperlipidemia, unspecified: Secondary | ICD-10-CM

## 2018-10-13 DIAGNOSIS — K219 Gastro-esophageal reflux disease without esophagitis: Secondary | ICD-10-CM | POA: Diagnosis not present

## 2018-10-13 MED ORDER — CLOTRIMAZOLE-BETAMETHASONE 1-0.05 % EX CREA: TOPICAL_CREAM | Freq: Two times a day (BID) | CUTANEOUS | 0 refills | Status: DC | PRN

## 2018-10-13 NOTE — Assessment & Plan Note (Signed)
Improved now she has restarted PPI.

## 2018-10-13 NOTE — Assessment & Plan Note (Signed)
Low cholesterol diet , welchiol and max atorvastatin. Has not tolerated crestor or zetia in past. Has improved some but if not continuing to improve can consider referral to lipid clinic for alternative treatment options.

## 2018-10-13 NOTE — Patient Instructions (Signed)
Continue working on healthy eating and regular exercise as able... low carbohydrate and low cholesterol diet.

## 2018-10-13 NOTE — Progress Notes (Signed)
VIRTUAL VISIT Due to national recommendations of social distancing due to [place] 19, a virtual visit is felt to be most appropriate for this patient at this time.   I connected with the patient on 10/13/18 at  8:20 AM EDT by virtual telehealth platform and verified that I am speaking with the correct person using two identifiers.   I discussed the limitations, risks, security and privacy concerns of performing an evaluation and management service by  virtual telehealth platform and the availability of in person appointments. I also discussed with the patient that there may be a patient responsible charge related to this service. The patient expressed understanding and agreed to proceed.  Patient location: Home Provider Location: [facility] Participants: Eliezer Lofts and Frazier Butt   Chief Complaint  Patient presents with  . 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   Plan 2 year treatment  With steroids... 04/2020  Diabetes:   On Lantus Solostar 21 Units. Lab Results  Component Value Date   HGBA1C 7.8 (H) 10/09/2018  Using medications without difficulties: Hypoglycemic episodes:none Hyperglycemic episodes:none Feet problems:no ulcers Blood Sugars averaging: FBS 90, 100, 103, 105,  eye exam within last year: yesn ( went q 2 weeks in March for vitreous detachment, left eye)  Elevated Cholesterol:  ON statin max and welchol. Improved 20 points in last 3 months Has not tolerated crestor or zetia in past. Lab Results  Component Value Date   CHOL 242 (H) 10/09/2018   HDL 64.00 10/09/2018   LDLCALC 147 (H) 10/09/2018   LDLDIRECT 197.0 03/27/2018   TRIG 156.0 (H) 10/09/2018   CHOLHDL 4 10/09/2018  Using medications without problems: Muscle aches:  Diet compliance: good Exercise: daily Other complaints:   COVID 19 screen No recent travel or  known exposure to COVID19 The patient denies respiratory symptoms of COVID 19 at this time.  The importance of social distancing was discussed today.   Review of Systems  Constitutional: Negative for chills and fever.  HENT: Negative for congestion and ear pain.   Eyes: Negative for pain and redness.  Respiratory: Negative for cough and shortness of breath.   Cardiovascular: Negative for chest pain, palpitations and leg swelling.  Gastrointestinal: Positive for nausea. Negative for abdominal pain, blood in stool, constipation, diarrhea and vomiting.        Reflux and regurgitation.. has follow up with GI.Marland Kitchen restarted PPI  Genitourinary: Negative for dysuria.  Musculoskeletal: Negative for falls and myalgias.  Skin: Negative for rash.  Neurological: Negative for dizziness.  Psychiatric/Behavioral: Negative for depression. The patient is not nervous/anxious.       Past Medical History:  Diagnosis Date  . Arthritis    osteoarthritis B knees, left hip , right elbow  . Complication of anesthesia    difficult waking  . Diabetes mellitus without complication ([place])    Diet and exercise controlled  . GERD (gastroesophageal reflux disease)   . Headache   . History of kidney stones   . Hyperlipidemia   . MVP (mitral valve prolapse)    history of  . PONV (postoperative nausea and vomiting)     reports that she has quit smoking. She has never used smokeless tobacco. She reports that she does not drink alcohol or use drugs.   Current Outpatient Medications:  .  atorvastatin (LIPITOR) 80 MG tablet, Take 1 tablet (80 mg  total) by mouth daily., Disp: 90 tablet, Rfl: 3 .  cholecalciferol (VITAMIN D) 1000 units tablet, Take 1,000 Units by mouth daily., Disp: , Rfl:  .  clotrimazole-betamethasone (LOTRISONE) cream, APPLY TO AFFECTED AREA(S) AS NEEDED, Disp: 30 g, Rfl: 0 .  colesevelam (WELCHOL) 625 MG tablet, Take 3 tablets (1,875 mg total) by mouth 2 (two) times daily with a meal., Disp: 540  tablet, Rfl: 3 .  gabapentin (NEURONTIN) 300 MG capsule, Take 2 capsules (600 mg total) by mouth 2 (two) times daily., Disp: 120 capsule, Rfl: 5 .  Galcanezumab-gnlm (EMGALITY) 120 MG/ML SOSY, Inject 120 mg into the skin every 30 (thirty) days., Disp: 1 Syringe, Rfl: 0 .  glucose blood (ONETOUCH VERIO) test strip, Use to check blood sugar two times a day, Disp: 100 each, Rfl: 12 .  Insulin Pen Needle 29G X 12.7MM MISC, by Does not apply route. Use to inject insulin daily.  Dx: E11.9, Disp: , Rfl:  .  Lancets (ONETOUCH ULTRASOFT) lancets, Use to check blood sugar two times daily, Disp: 100 each, Rfl: 12 .  omeprazole (PRILOSEC) 20 MG capsule, Take 20 mg by mouth every morning., Disp: , Rfl:  .  OnabotulinumtoxinA (BOTOX IJ), Inject 155 Units as directed. , Disp: , Rfl:  .  promethazine (PHENERGAN) 12.5 MG tablet, Take 12.5 mg by mouth every 6 (six) hours as needed for nausea or vomiting., Disp: , Rfl:  .  Riboflavin (VITAMIN B2 PO), Take 400 mg by mouth daily., Disp: , Rfl:  .  SUMAtriptan (IMITREX) 6 MG/0.5ML SOLN injection, Inject 0.5 mLs (6 mg total) into the skin every 2 (two) hours as needed for migraine or headache. May repeat in 2 hours if headache persists or recurs., Disp: 15 mL, Rfl: 5   Observations/Objective: Height 5' 0.5" (1.537 m), weight 141 lb (64 kg).  Physical Exam  Physical Exam Constitutional:      General: The patient is not in acute distress. Pulmonary:     Effort: Pulmonary effort is normal. No respiratory distress.  Neurological:     Mental Status: The patient is alert and oriented to person, place, and time.  Psychiatric:        Mood and Affect: Mood normal.        Behavior: Behavior normal.   Assessment and Plan Diabetes mellitus with no complication (HCC)  Improving control on Lantus. Re-eval in 3 months given not at goal A1C < 7. Encouraged exercise, weight loss, healthy eating habits.   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[place]) Followed by GI... moderately stable  symptoms control on 6 mg entocort.  Plan 2 year course of steroids.  GERD Improved now she has restarted PPI.  Hyperlipidemia  Low cholesterol diet , welchiol and max atorvastatin. Has not tolerated crestor or zetia in past. Has improved some but if not continuing to improve can consider referral to lipid clinic for alternative treatment options.     I discussed the assessment and treatment plan with the patient. The patient was provided an opportunity to ask questions and all were answered. The patient agreed with the plan and demonstrated an understanding of the instructions.   The patient was advised to call back or seek an in-person evaluation if the symptoms worsen or if the condition fails to improve as anticipated.     Eliezer Lofts, MD

## 2018-10-13 NOTE — Assessment & Plan Note (Addendum)
Improving control on Lantus. Re-eval in 3 months given not at goal A1C < 7. Encouraged exercise, weight loss, healthy eating habits.

## 2018-10-13 NOTE — Assessment & Plan Note (Signed)
Followed by GI... moderately stable symptoms control on 6 mg entocort.  Plan 2 year course of steroids.

## 2018-11-14 ENCOUNTER — Encounter: Payer: Self-pay | Admitting: *Deleted

## 2018-11-14 NOTE — Progress Notes (Addendum)
The approval letter was sent to scan into media 11/14/2018 Outcome  Approvedtoday  Request Reference Number: [id number]. BOTOX INJ 200UNIT is approved through 02/14/2019. For further questions, call [phone].      Boyko KeyKeturah Leach - PA Case ID: [id number] Need help? Call us at [phone]  Status  Additional Information Required  DrugBotox 200UNIT IJ SOLR  Form[employer] Medicare Part D Electronic Prior Authorization Form (2017 NCPDP)  I sent office note and a note stating that her PA was good until 11/17/18 but her 2nd injection is scheduled for 11/24/2018

## 2018-11-24 ENCOUNTER — Other Ambulatory Visit: Payer: Self-pay

## 2018-11-24 ENCOUNTER — Ambulatory Visit (INDEPENDENT_AMBULATORY_CARE_PROVIDER_SITE_OTHER): Payer: Medicare Other | Admitting: Neurology

## 2018-11-24 DIAGNOSIS — G43709 Chronic migraine without aura, not intractable, without status migrainosus: Secondary | ICD-10-CM | POA: Diagnosis not present

## 2018-11-24 NOTE — Progress Notes (Signed)
[facility]um Clinic   Procedure Note Botox  Attending: Dr. Tomi Likens  Preoperative Diagnosis(es): Chronic migraine  Consent obtained from: Yes Benefits discussed included, but were not limited to decreased muscle tightness, increased joint range of motion, and decreased pain.  Risk discussed included, but were not limited pain and discomfort, bleeding, bruising, excessive weakness, venous thrombosis, muscle atrophy and dysphagia.  Anticipated outcomes of the procedure as well as he risks and benefits of the alternatives to the procedure, and the roles and tasks of the personnel to be involved, were discussed with the patient, and the patient consents to the procedure and agrees to proceed. A copy of the patient medication guide was given to the patient which explains the blackbox warning.  Patients identity and treatment sites confirmed Yes  Details of Procedure: Skin was cleaned with alcohol. Prior to injection, the needle plunger was aspirated to make sure the needle was not within a blood vessel.  There was no blood retrieved on aspiration.    Following is a summary of the muscles injected  And the amount of Botulinum toxin used:  Dilution 200 units of Botox was reconstituted with 4 ml of preservative free normal saline. Time of reconstitution: At the time of the office visit (<30 minutes prior to injection)   Injections  155 total units of Botox was injected with a 30 gauge needle.  Injection Sites: L occipitalis: 15 units- 3 sites  R occiptalis: 15 units- 3 sites  L upper trapezius: 15 units- 3 sites R upper trapezius: 15 units- 3 sits          L paraspinal: 10 units- 2 sites R paraspinal: 10 units- 2 sites  Face L frontalis(2 injection sites):10 units   R frontalis(2 injection sites):10 units         L corrugator: 5 units   R corrugator: 5 units           Procerus: 5 units   L temporalis: 20 units R temporalis: 20 units   Agent:  200 units of botulinum Type A (Onobotulinum  Toxin type A) was reconstituted with 4 ml of preservative free normal saline.  Time of reconstitution: At the time of the office visit (<30 minutes prior to injection)     Total injected (Units): 155  Total wasted (Units): 14  Patient tolerated procedure well without complications.   Reinjection is anticipated in 3 months.

## 2018-12-20 ENCOUNTER — Telehealth: Payer: Self-pay | Admitting: Neurology

## 2018-12-20 NOTE — Telephone Encounter (Signed)
Debra Leach from the patient's pharm is calling in about him working on tapering off the gabapentin. 300 mb 2 caps twice a day, full 600 is causing loss of function (light headed, dizzy) her AM dose has been tapered. Patient would like 100mg  script (90) pills can she adjust to 100mg -300mg  but also would like 60 of the 300mg  dose before bed. Can this be possible?

## 2018-12-21 MED ORDER — GABAPENTIN 300 MG PO CAPS: 300.0000 mg | ORAL_CAPSULE | Freq: Two times a day (BID) | ORAL | 0 refills | Status: DC

## 2018-12-21 MED ORDER — GABAPENTIN 100 MG PO CAPS: 100.0000 mg | ORAL_CAPSULE | ORAL | 0 refills | Status: DC

## 2018-12-21 NOTE — Telephone Encounter (Signed)
Called pharmacy, spoke with Legrand Como.  He is to fill gabapentin:  300 mg 1 BID 90 day if Pt wants that amount, or can get 30 day. Also, gabapentin 100 mg up to 3 a day.

## 2018-12-25 ENCOUNTER — Other Ambulatory Visit: Payer: Self-pay

## 2018-12-25 MED ORDER — GABAPENTIN 300 MG PO CAPS: 300.0000 mg | ORAL_CAPSULE | Freq: Two times a day (BID) | ORAL | 0 refills | Status: DC

## 2018-12-25 MED ORDER — GABAPENTIN 100 MG PO CAPS: 100.0000 mg | ORAL_CAPSULE | Freq: Three times a day (TID) | ORAL | 0 refills | Status: DC | PRN

## 2019-01-07 NOTE — Progress Notes (Signed)
Virtual Visit via Telephone Note The purpose of this virtual visit is to provide medical care while limiting exposure to the novel coronavirus.    Consent was obtained for phone visit:  Yes Answered questions that patient had about telehealth interaction:  Yes I discussed the limitations, risks, security and privacy concerns of performing an evaluation and management service by telephone. I also discussed with the patient that there may be a patient responsible charge related to this service. The patient expressed understanding and agreed to proceed.  Pt location: Home Physician Location: Home Name of referring provider:  [name], MD I connected with .Debra Leach at patients initiation/request on 01/08/2019 at  9:50 AM EDT by telephone and verified that I am speaking with the correct person using two identifiers.  Pt MRN:  [id number] Pt DOB:  03/27/1951   History of Present Illness:  Debra Leach is a 68 year old right-handed female with autoimmune pancreatitis, migraines, hyperlipidemia, diabetes, GERD, osteoarthritis, and history of MVP and renal stones who follows up for migraines.  UPDATE: She has had some nausea and vomiting over the past few weeks due to the autoimmune pancreatitis.  Emgality was ineffective, so she restarted Botox.  Status post 2 rounds.  Intensity:  Out of 139 migraine days, only 30% were 5-9/10 (otherwise 1-4) Duration:  Only 1 day lasted 6 hours.  Otherwise, 2-3 hours Frequency:  44 out of 139 migraine days. Frequency of abortive medication: sumatriptan tablet 2-3 days a month, used injection only once. Current NSAIDS/steroid: budesomide 9mg  daily Current analgesics: None Current triptans: Sumatriptan 6 mg [place] Current ergotamine: None Current anti-emetic: Promethazine 12.5 mg Current muscle relaxants: None Current anti-anxiolytic: None Current sleep aide: None Current Antihypertensive medications: None Current Antidepressant  medications: None Current Anticonvulsant medications: Gabapentin 300mg  twice daily Current anti-CGRP: Emgality Current Vitamins/Herbal/Supplements: Riboflavin 400 mg daily, vitamin D Current Antihistamines/Decongestants: None Other therapy: Botox, Cefaly, ice pack Other medication: Meclizine  Caffeine: 1 cup of coffee daily Alcohol: None Smoker: No Diet: Follows LE[employer] ImmunoCalm Dietary Program.  Hydrates. Exercise: When tolerated (knee problems).  She walks and bikes. Depression: No; Anxiety: No Other pain:  no Sleep hygiene: Improved  HISTORY:  Onset: 68 years old Location:Varies (unilateral either side, frontal-temporal, back of head) Quality:Varies (stabbing, pounding, throbbing) Initial Intensity:10/10 severe, otherwise 5-7/10 Aura:Occurs off and on over the years and varies in semiology.In early 1970s, black out.In late 1980s, white out.In late 1990s, flashing lights and zigzag lines.Since 2016, scintillating scotoma (occurs 1 to 2 times a month) Prodrome:no Associated symptoms: Initially vomiting.Sometimes nausea.Photophobia, phonophobia.Dizziness. Initial Duration:All day Initial Frequency:daily Triggers: Change in weather, emotional stress, valsalva maneuver, odors, certain foods Relieving factors: ice, rest Activity:Difficult to function if severe  Past NSAIDS:diclofenac 50mg , Cambia, Mobic 15mg , ibuprofen, naproxen, toradol  Past analgesics:tramadol (reaction), Midrin, Excedrin Past abortive triptans/ergots:Treximet, Axert, Amerge, Frova, Maxalt, Relpax, Zomig tablet, DHE NS, sumatriptan 100mg  Past anti-emetic: Zofran 4mg  Past anxiolytic:Buspirone, clonazepam Past antihypertensive medications:Metoprolol, Norvasc, propranolol ER 120mg , atenolol 100mg  (side effects dizziness, myalgias, acid reflux) Past antidepressant medications:Venlafaxine, sertraline, amitriptyline, amoxapine, duloxetine, nortriptyline, imipramine, Luvox,  Remeron, Serzone, bupropion, desipramine, doxepin, Vivactil Past anticonvulsant medications:Depakote, Topamax, zonisamide, Lamictal, Keppra, Lyrica, gabapentin 300/300/600  Past anti--CGRP: Aimovig Past vitamins/Herbal/Supplements:butterbur, Mg, CoQ10 Other past treatments:Botox (2 years), Cefaly Device, methylergonovine, acupuncture, biofeedback, Seroquel, Thorazine, methylsergide maleate  Family history of headache:Maybe her mother's side.  Prior brain MRI normal (date unknown but report is mentioned in prior notes).  Abnormal Movements: In December 2017, we started atenolol for migraine prevention. It  worked well, but due to reported side effects of dizziness, acid reflux and joint and muscle pain, she was tapered off of it in early April. Around that time, she developed shaking episodes or tremors associated with her migraines. She followed up with her PCP, Dr. Diona Browner, on 10/08/16 and we had her discontinue the gabapentin. She hasn't had any recurrent tremors, however she says she has gone up to 4 days at a time without an attack. Since discontinuing the gabapentin, the intensity of her daily headaches have gotten worse.   I reviewed the video of her habitual attacks. In the video, she exhibits flapping of her right hand, arrhythmic and without tremor or choreiform movement. It is not an arrhythmic jerking, consistent with myoclonus. It sometimes involves the left hand as well. She denies stress and anxiety.    Observations/Objective:   Height 5' 0.5" (1.537 m), weight 146 lb (66.2 kg). No acute distress.  Alert and oriented.  Speech fluent and not dysarthric.  Language intact.    Assessment and Plan:   Chronic migraine without aura, without status migrainosus, not intractable, much improved with Botox and Emgality  1.  For preventative management, Botox and Emgality 2.  For abortive therapy, will see if Roselyn Meier 100mg  will be affordable (would rather avoid using  triptans if possible due to age.  Otherwise, may use sumatriptan tablet limited to 2-3 days a month) 3.  Limit use of pain relievers to no more than 2 days out of week to prevent risk of rebound or medication-overuse headache. 4.  Keep headache diary 5.  Exercise, hydration, caffeine cessation, sleep hygiene, monitor for and avoid triggers 6.  Consider:  magnesium citrate 400mg  daily, riboflavin 400mg  daily, and coenzyme Q10 100mg  three times daily 7. Always keep in mind that currently taking a hormone or birth control may be a possible trigger or aggravating factor for migraine. 8. Follow up 6 months.    Follow Up Instructions:    -I discussed the assessment and treatment plan with the patient. The patient was provided an opportunity to ask questions and all were answered. The patient agreed with the plan and demonstrated an understanding of the instructions.   The patient was advised to call back or seek an in-person evaluation if the symptoms worsen or if the condition fails to improve as anticipated.    Total Time spent in visit with the patient was:  15 minutes   Dudley Major, DO

## 2019-01-08 ENCOUNTER — Other Ambulatory Visit (INDEPENDENT_AMBULATORY_CARE_PROVIDER_SITE_OTHER): Payer: Medicare Other

## 2019-01-08 ENCOUNTER — Encounter: Payer: Self-pay | Admitting: Neurology

## 2019-01-08 ENCOUNTER — Telehealth: Payer: Self-pay | Admitting: Family Medicine

## 2019-01-08 ENCOUNTER — Other Ambulatory Visit: Payer: Self-pay

## 2019-01-08 ENCOUNTER — Telehealth (INDEPENDENT_AMBULATORY_CARE_PROVIDER_SITE_OTHER): Payer: Medicare Other | Admitting: Neurology

## 2019-01-08 VITALS — Ht 60.5 in | Wt 146.0 lb

## 2019-01-08 DIAGNOSIS — E559 Vitamin D deficiency, unspecified: Secondary | ICD-10-CM

## 2019-01-08 DIAGNOSIS — E119 Type 2 diabetes mellitus without complications: Secondary | ICD-10-CM

## 2019-01-08 DIAGNOSIS — G43709 Chronic migraine without aura, not intractable, without status migrainosus: Secondary | ICD-10-CM

## 2019-01-08 LAB — COMPREHENSIVE METABOLIC PANEL
ALT: 20 U/L (ref 0–35)
AST: 15 U/L (ref 0–37)
Albumin: 3.8 g/dL (ref 3.5–5.2)
Alkaline Phosphatase: 72 U/L (ref 39–117)
BUN: 15 mg/dL (ref 6–23)
CO2: 30 mEq/L (ref 19–32)
Calcium: 9.5 mg/dL (ref 8.4–10.5)
Chloride: 105 mEq/L (ref 96–112)
Creatinine, Ser: 0.96 mg/dL (ref 0.40–1.20)
GFR: 57.85 mL/min — ABNORMAL LOW (ref >60.00)
Glucose, Bld: 98 mg/dL (ref 70–99)
Potassium: 4.6 mEq/L (ref 3.5–5.1)
Sodium: 144 mEq/L (ref 135–145)
Total Bilirubin: 0.4 mg/dL (ref 0.2–1.2)
Total Protein: 6.6 g/dL (ref 6.0–8.3)

## 2019-01-08 LAB — LIPID PANEL
Cholesterol: 275 mg/dL — ABNORMAL HIGH (ref 0–200)
HDL: 61.5 mg/dL (ref >39.00)
NonHDL: 213.46
Total CHOL/HDL Ratio: 4
Triglycerides: 207 mg/dL — ABNORMAL HIGH (ref 0.0–149.0)
VLDL: 41.4 mg/dL — ABNORMAL HIGH (ref 0.0–40.0)

## 2019-01-08 LAB — LDL CHOLESTEROL, DIRECT: Direct LDL: 183 mg/dL

## 2019-01-08 LAB — VITAMIN D 25 HYDROXY (VIT D DEFICIENCY, FRACTURES): VITD: 25.81 ng/mL — ABNORMAL LOW (ref 30.00–100.00)

## 2019-01-08 LAB — HEMOGLOBIN A1C: Hgb A1c MFr Bld: 8.1 % — ABNORMAL HIGH (ref 4.6–6.5)

## 2019-01-08 MED ORDER — UBRELVY 100 MG PO TABS: 1.0000 | ORAL_TABLET | ORAL | 3 refills | Status: DC | PRN

## 2019-01-08 NOTE — Telephone Encounter (Signed)
-----   Message from Ellamae Sia sent at 01/03/2019 10:14 AM EDT ----- Regarding: Lab orders for Monday, 8.3.20 Patient is scheduled for CPX labs, please order future labs, Thanks , Karna Christmas

## 2019-01-09 NOTE — Progress Notes (Signed)
No critical labs need to be addressed urgently. We will discuss labs in detail at upcoming office visit.   

## 2019-01-11 ENCOUNTER — Other Ambulatory Visit: Payer: Self-pay | Admitting: Family Medicine

## 2019-01-11 ENCOUNTER — Ambulatory Visit (INDEPENDENT_AMBULATORY_CARE_PROVIDER_SITE_OTHER): Payer: Medicare Other | Admitting: Family Medicine

## 2019-01-11 ENCOUNTER — Encounter: Payer: Self-pay | Admitting: Family Medicine

## 2019-01-11 ENCOUNTER — Other Ambulatory Visit: Payer: Self-pay

## 2019-01-11 ENCOUNTER — Ambulatory Visit: Payer: Medicare Other | Admitting: Family Medicine

## 2019-01-11 VITALS — Ht 60.5 in | Wt 146.0 lb

## 2019-01-11 DIAGNOSIS — E785 Hyperlipidemia, unspecified: Secondary | ICD-10-CM | POA: Diagnosis not present

## 2019-01-11 DIAGNOSIS — E119 Type 2 diabetes mellitus without complications: Secondary | ICD-10-CM | POA: Diagnosis not present

## 2019-01-11 DIAGNOSIS — K861 Other chronic pancreatitis: Secondary | ICD-10-CM

## 2019-01-11 DIAGNOSIS — E559 Vitamin D deficiency, unspecified: Secondary | ICD-10-CM

## 2019-01-11 DIAGNOSIS — Z1231 Encounter for screening mammogram for malignant neoplasm of breast: Secondary | ICD-10-CM

## 2019-01-11 NOTE — Progress Notes (Signed)
VIRTUAL VISIT Due to national recommendations of social distancing due to [place] 19, a virtual visit is felt to be most appropriate for this patient at this time.   I connected with the patient on 01/11/19 at  8:20 AM EDT by virtual telehealth platform and verified that I am speaking with the correct person using two identifiers.   I discussed the limitations, risks, security and privacy concerns of performing an evaluation and management service by  virtual telehealth platform and the availability of in person appointments. I also discussed with the patient that there may be a patient responsible charge related to this service. The patient expressed understanding and agreed to proceed.  Patient location: Home Provider Location: [facility] Participants: Eliezer Lofts and Frazier Butt   Chief Complaint  Patient presents with  . Diabetes    Says her readings have slowly started rising    History of Present Illness:  68 year old female presents for follow up Dm.  Diabetes:  Worsening control on 21 units insulin She is on budesonide for autoimmune pancreatitis. She has had a flare up and has had to be on higher dose of steroid per GI... will be on this 7/10 through 8/14. She is unable to eat a low carb diet given her migraine food triggers. Lab Results  Component Value Date   HGBA1C 8.1 (H) 01/08/2019  Using medications without difficulties: Hypoglycemic episodes:none Hyperglycemic episodes: yes Feet problems: no ulcers Blood Sugars averaging: FBS: 120-164 post prandially 180 eye exam within last year: uptodate  Elevated Cholesterol:  Inadequate control.. on atorvastatin 80 mg daily and welchol. Wasd off for a while with flare of of autoimmune pancreatitis. Lab Results  Component Value Date   CHOL 275 (H) 01/08/2019   HDL 61.50 01/08/2019   LDLCALC 147 (H) 10/09/2018   LDLDIRECT 183.0 01/08/2019   TRIG 207.0 (H) 01/08/2019   CHOLHDL 4 01/08/2019  Using medications  without problems: Muscle aches:  Diet compliance: moderate Exercise:poor Other complaints:  BP Readings from Last 3 Encounters:  06/29/18 130/70  06/28/18 (!) 154/84  03/31/18 130/82     vit D low:  On supplement 1000 units daily.   COVID 19 screen No recent travel or known exposure to COVID19 The patient denies respiratory symptoms of COVID 19 at this time.  The importance of social distancing was discussed today.   Review of Systems  Constitutional: Negative for chills and fever.  HENT: Negative for congestion and ear pain.   Eyes: Negative for pain and redness.  Respiratory: Negative for cough and shortness of breath.   Cardiovascular: Negative for chest pain, palpitations and leg swelling.  Gastrointestinal: Negative for abdominal pain, blood in stool, constipation, diarrhea, nausea and vomiting.  Genitourinary: Negative for dysuria.  Musculoskeletal: Negative for falls and myalgias.  Skin: Negative for rash.  Neurological: Negative for dizziness.  Psychiatric/Behavioral: Negative for depression. The patient is not nervous/anxious.       Past Medical History:  Diagnosis Date  . Arthritis    osteoarthritis B knees, left hip , right elbow  . Complication of anesthesia    difficult waking  . Diabetes mellitus without complication ([place])    Diet and exercise controlled  . GERD (gastroesophageal reflux disease)   . Headache   . History of kidney stones   . Hyperlipidemia   . MVP (mitral valve prolapse)    history of  . PONV (postoperative nausea and vomiting)     reports that she has quit smoking. She  has never used smokeless tobacco. She reports that she does not drink alcohol or use drugs.   Current Outpatient Medications:  .  atorvastatin (LIPITOR) 80 MG tablet, Take 1 tablet (80 mg total) by mouth daily., Disp: 90 tablet, Rfl: 3 .  budesonide (ENTOCORT EC) 3 MG 24 hr capsule, Take 9 mg by mouth daily. , Disp: , Rfl:  .  cholecalciferol (VITAMIN D) 1000 units  tablet, Take 1,000 Units by mouth daily., Disp: , Rfl:  .  clotrimazole-betamethasone (LOTRISONE) cream, Apply topically 2 (two) times daily as needed., Disp: 30 g, Rfl: 0 .  colesevelam (WELCHOL) 625 MG tablet, Take 3 tablets (1,875 mg total) by mouth 2 (two) times daily with a meal., Disp: 540 tablet, Rfl: 3 .  gabapentin (NEURONTIN) 100 MG capsule, Take 1 capsule (100 mg total) by mouth 3 (three) times daily as needed. Up to three times a day, Disp: 270 capsule, Rfl: 0 .  gabapentin (NEURONTIN) 300 MG capsule, Take 1 capsule (300 mg total) by mouth 2 (two) times daily., Disp: 180 capsule, Rfl: 0 .  Galcanezumab-gnlm (EMGALITY) 120 MG/ML SOSY, Inject 120 mg into the skin every 30 (thirty) days., Disp: 1 Syringe, Rfl: 0 .  glucose blood (ONETOUCH VERIO) test strip, Use to check blood sugar two times a day, Disp: 100 each, Rfl: 12 .  Insulin Glargine (LANTUS SOLOSTAR) 100 UNIT/ML Solostar Pen, Inject 21 Units into the skin daily., Disp: , Rfl:  .  Insulin Pen Needle 29G X 12.7MM MISC, by Does not apply route. Use to inject insulin daily.  Dx: E11.9, Disp: , Rfl:  .  Lancets (ONETOUCH ULTRASOFT) lancets, Use to check blood sugar two times daily, Disp: 100 each, Rfl: 12 .  omeprazole (PRILOSEC) 20 MG capsule, Take 20 mg by mouth every morning., Disp: , Rfl:  .  OnabotulinumtoxinA (BOTOX IJ), Inject 155 Units as directed. , Disp: , Rfl:  .  promethazine (PHENERGAN) 12.5 MG tablet, Take 12.5 mg by mouth every 6 (six) hours as needed for nausea or vomiting., Disp: , Rfl:  .  Riboflavin (VITAMIN B2 PO), Take 400 mg by mouth daily., Disp: , Rfl:  .  SUMAtriptan (IMITREX) 6 MG/0.5ML SOLN injection, Inject 0.5 mLs (6 mg total) into the skin every 2 (two) hours as needed for migraine or headache. May repeat in 2 hours if headache persists or recurs., Disp: 15 mL, Rfl: 5 .  Ubrogepant (UBRELVY) 100 MG TABS, Take 1 tablet by mouth as needed (May repeat dose after 2 hours if needed.  Maximum 2 tablets in 24  hours). (Patient not taking: Reported on 01/11/2019), Disp: 10 tablet, Rfl: 3   Observations/Objective: Height 5' 0.5" (1.537 m), weight 146 lb (66.2 kg).  Assessment and Plan Vitamin D deficiency INcrease to 2000 units daily x 30 days then return to 1000 Units daily.  Hyperlipidemia Low choldiet. Get back to exercise. Continue atorvastatin max and welchol. Pt does not wish to change regimen.  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HCC) Recent flare and resulting cessation of meds temporarily given not eating and increase in budesonide. Now improving.  Diabetes mellitus with no complication (HCC) Worsening control.. increase lantus to 25 untis daily. Get back to low carb diet and exercise.     I discussed the assessment and treatment plan with the patient. The patient was provided an opportunity to ask questions and all were answered. The patient agreed with the plan and demonstrated an understanding of the instructions.   The patient was advised to call back or  seek an in-person evaluation if the symptoms worsen or if the condition fails to improve as anticipated.     Eliezer Lofts, MD

## 2019-01-11 NOTE — Patient Instructions (Addendum)
Increase Lantus to  25 Uits daily.  Follow fasting blood sugars and call/mychart the levels in next 2 weeks on that dose.  get back to exercise as able.  Increase vit D to 2000 units daily.

## 2019-01-11 NOTE — Assessment & Plan Note (Signed)
Low choldiet. Get back to exercise. Continue atorvastatin max and welchol. Pt does not wish to change regimen.

## 2019-01-11 NOTE — Assessment & Plan Note (Signed)
Recent flare and resulting cessation of meds temporarily given not eating and increase in budesonide. Now improving.

## 2019-01-11 NOTE — Assessment & Plan Note (Signed)
INcrease to 2000 units daily x 30 days then return to 1000 Units daily.

## 2019-01-11 NOTE — Assessment & Plan Note (Signed)
Worsening control.. increase lantus to 25 untis daily. Get back to low carb diet and exercise.

## 2019-01-17 ENCOUNTER — Telehealth: Payer: Self-pay | Admitting: Neurology

## 2019-01-17 NOTE — Telephone Encounter (Signed)
Sharyn Lull from [employer] left msg with after hours about PA for this patient.

## 2019-02-01 ENCOUNTER — Other Ambulatory Visit: Payer: Self-pay

## 2019-02-01 ENCOUNTER — Ambulatory Visit
Admission: RE | Admit: 2019-02-01 | Discharge: 2019-02-01 | Disposition: A | Discharge status: Home / Self Care | Payer: Medicare Other | Source: Ambulatory Visit

## 2019-02-01 DIAGNOSIS — Z1231 Encounter for screening mammogram for malignant neoplasm of breast: Secondary | ICD-10-CM

## 2019-02-06 DIAGNOSIS — I82409 Acute embolism and thrombosis of unspecified deep veins of unspecified lower extremity: Secondary | ICD-10-CM

## 2019-02-06 HISTORY — DX: Acute embolism and thrombosis of unspecified deep veins of unspecified lower extremity: I82.409

## 2019-02-09 ENCOUNTER — Other Ambulatory Visit: Payer: Self-pay | Admitting: Family Medicine

## 2019-02-09 DIAGNOSIS — E119 Type 2 diabetes mellitus without complications: Secondary | ICD-10-CM

## 2019-02-15 ENCOUNTER — Ambulatory Visit (INDEPENDENT_AMBULATORY_CARE_PROVIDER_SITE_OTHER): Payer: Medicare Other

## 2019-02-15 DIAGNOSIS — Z23 Encounter for immunization: Secondary | ICD-10-CM

## 2019-02-20 ENCOUNTER — Encounter: Payer: Self-pay | Admitting: *Deleted

## 2019-02-20 NOTE — Progress Notes (Addendum)
Gwenlyn Found KeyPatrica Duel - PA Case ID: [id number] Need help? Call us at [phone] Outcome Approvedon September 15 Request Reference Number: [id number]. BOTOX INJ 200UNIT is approved through 05/22/2019. For further questions, call [phone]. Drug Botox 200UNIT IJ SOLR Form [employer] Medicare Part D Electronic Prior Authorization Form (2017 NCPDP) Approval letter sent to scan

## 2019-02-21 ENCOUNTER — Encounter: Payer: Self-pay | Admitting: *Deleted

## 2019-02-21 NOTE — Telephone Encounter (Signed)
This was approved we received this notice today: Patient Name: Debra Leach Patient DOB: 19-Oct-1950 Patient ID: [id number] Status of Request: Approve Medication Name: Dolly Rias 100mg  GPI/NDC: [id number] Decision Notes: UBRELVY TAB 100MG , use as directed, is approved through 06/07/2019 under your Medicare Part D benefit. Reviewed by: System If the treating physician would like to discuss this coverage decision with the physician or health care professional reviewer, please call [employer] Prior Authorization department at [phone].

## 2019-02-21 NOTE — Progress Notes (Addendum)
Nickie Beckstrom (Key: [id number]) Roselyn Meier 100MG  OR TABS   Form [employer] Medicare Part D Electronic Prior Authorization Form (2017 NCPDP) Created 3 hours ago Sent to Plan 2 hours ago Plan Response 2 hours ago Submit Clinical Questions 2 hours ago Determination Favorable 1 hour ago Message from Plan Request Reference Number: [id number]. UBRELVY TAB 100MG  is approved through 06/07/2019. For further questions, call [phone].

## 2019-02-23 ENCOUNTER — Ambulatory Visit (INDEPENDENT_AMBULATORY_CARE_PROVIDER_SITE_OTHER): Payer: Medicare Other | Admitting: Neurology

## 2019-02-23 ENCOUNTER — Other Ambulatory Visit: Payer: Self-pay

## 2019-02-23 DIAGNOSIS — G43709 Chronic migraine without aura, not intractable, without status migrainosus: Secondary | ICD-10-CM

## 2019-02-23 MED ORDER — ONABOTULINUMTOXINA 100 UNITS IJ SOLR
100.0000 [IU] | Freq: Once | INTRAMUSCULAR | Status: AC
  Administered 2019-02-23: 100 [IU] via INTRAMUSCULAR

## 2019-02-23 NOTE — Progress Notes (Signed)
[facility]   Procedure Note Botox  Attending: Dr. Metta Clines  Preoperative Diagnosis(es): Chronic migraine  Consent obtained from: Yes Benefits discussed included, but were not limited to decreased muscle tightness, increased joint range of motion, and decreased pain.  Risk discussed included, but were not limited pain and discomfort, bleeding, bruising, excessive weakness, venous thrombosis, muscle atrophy and dysphagia.  Anticipated outcomes of the procedure as well as he risks and benefits of the alternatives to the procedure, and the roles and tasks of the personnel to be involved, were discussed with the patient, and the patient consents to the procedure and agrees to proceed. A copy of the patient medication guide was given to the patient which explains the blackbox warning.  Patients identity and treatment sites confirmed Yes.  Details of Procedure: Skin was cleaned with alcohol. Prior to injection, the needle plunger was aspirated to make sure the needle was not within a blood vessel.  There was no blood retrieved on aspiration.    Following is a summary of the muscles injected  And the amount of Botulinum toxin used:  Dilution 200 units of Botox was reconstituted with 4 ml of preservative free normal saline. Time of reconstitution: At the time of the office visit (<30 minutes prior to injection)   Injections  155 total units of Botox was injected with a 30 gauge needle.  Injection Sites: L occipitalis: 15 units- 3 sites  R occiptalis: 15 units- 3 sites  L upper trapezius: 15 units- 3 sites R upper trapezius: 15 units- 3 sits          L paraspinal: 10 units- 2 sites R paraspinal: 10 units- 2 sites  Face L frontalis(2 injection sites):10 units   R frontalis(2 injection sites):10 units         L corrugator: 5 units   R corrugator: 5 units           Procerus: 5 units   L temporalis: 20 units R temporalis: 20 units   Agent:  200 units of botulinum Type A  (Onobotulinum Toxin type A) was reconstituted with 4 ml of preservative free normal saline.  Time of reconstitution: At the time of the office visit (<30 minutes prior to injection)     Total injected (Units): 155  Total wasted (Units): 9  Patient tolerated procedure well without complications.   Reinjection is anticipated in 3 months. Return to clinic in 4 1/2 months

## 2019-03-01 ENCOUNTER — Encounter (HOSPITAL_COMMUNITY): Payer: Self-pay

## 2019-03-01 ENCOUNTER — Ambulatory Visit (INDEPENDENT_AMBULATORY_CARE_PROVIDER_SITE_OTHER): Payer: Medicare Other | Admitting: Family Medicine

## 2019-03-01 ENCOUNTER — Emergency Department (HOSPITAL_COMMUNITY): Payer: Medicare Other

## 2019-03-01 ENCOUNTER — Encounter: Payer: Self-pay | Admitting: Family Medicine

## 2019-03-01 ENCOUNTER — Other Ambulatory Visit: Payer: Self-pay

## 2019-03-01 ENCOUNTER — Inpatient Hospital Stay (HOSPITAL_COMMUNITY)
Admission: EM | Admit: 2019-03-01 | Discharge: 2019-03-03 | DRG: 299 | Disposition: A | Discharge status: Home / Self Care | Payer: Medicare Other | Attending: Internal Medicine | Admitting: Internal Medicine

## 2019-03-01 VITALS — BP 165/105 | HR 118 | Temp 97.7°F | Ht 60.5 in | Wt 153.5 lb

## 2019-03-01 DIAGNOSIS — R000 Tachycardia, unspecified: Secondary | ICD-10-CM | POA: Diagnosis not present

## 2019-03-01 DIAGNOSIS — R319 Hematuria, unspecified: Secondary | ICD-10-CM | POA: Diagnosis not present

## 2019-03-01 DIAGNOSIS — I700 Atherosclerosis of aorta: Secondary | ICD-10-CM | POA: Diagnosis present

## 2019-03-01 DIAGNOSIS — I10 Essential (primary) hypertension: Secondary | ICD-10-CM

## 2019-03-01 DIAGNOSIS — E1165 Type 2 diabetes mellitus with hyperglycemia: Secondary | ICD-10-CM

## 2019-03-01 DIAGNOSIS — I82401 Acute embolism and thrombosis of unspecified deep veins of right lower extremity: Secondary | ICD-10-CM | POA: Diagnosis not present

## 2019-03-01 DIAGNOSIS — Z825 Family history of asthma and other chronic lower respiratory diseases: Secondary | ICD-10-CM

## 2019-03-01 DIAGNOSIS — R0609 Other forms of dyspnea: Secondary | ICD-10-CM

## 2019-03-01 DIAGNOSIS — Z87891 Personal history of nicotine dependence: Secondary | ICD-10-CM

## 2019-03-01 DIAGNOSIS — Z8249 Family history of ischemic heart disease and other diseases of the circulatory system: Secondary | ICD-10-CM

## 2019-03-01 DIAGNOSIS — I160 Hypertensive urgency: Secondary | ICD-10-CM | POA: Diagnosis present

## 2019-03-01 DIAGNOSIS — N6320 Unspecified lump in the left breast, unspecified quadrant: Secondary | ICD-10-CM | POA: Diagnosis present

## 2019-03-01 DIAGNOSIS — Z20828 Contact with and (suspected) exposure to other viral communicable diseases: Secondary | ICD-10-CM | POA: Diagnosis present

## 2019-03-01 DIAGNOSIS — K861 Other chronic pancreatitis: Secondary | ICD-10-CM | POA: Diagnosis present

## 2019-03-01 DIAGNOSIS — Z79899 Other long term (current) drug therapy: Secondary | ICD-10-CM

## 2019-03-01 DIAGNOSIS — I152 Hypertension secondary to endocrine disorders: Secondary | ICD-10-CM

## 2019-03-01 DIAGNOSIS — Z8261 Family history of arthritis: Secondary | ICD-10-CM

## 2019-03-01 DIAGNOSIS — Z90711 Acquired absence of uterus with remaining cervical stump: Secondary | ICD-10-CM

## 2019-03-01 DIAGNOSIS — E785 Hyperlipidemia, unspecified: Secondary | ICD-10-CM | POA: Diagnosis present

## 2019-03-01 DIAGNOSIS — E119 Type 2 diabetes mellitus without complications: Secondary | ICD-10-CM | POA: Diagnosis present

## 2019-03-01 DIAGNOSIS — I2699 Other pulmonary embolism without acute cor pulmonale: Secondary | ICD-10-CM | POA: Diagnosis not present

## 2019-03-01 DIAGNOSIS — Z87442 Personal history of urinary calculi: Secondary | ICD-10-CM

## 2019-03-01 DIAGNOSIS — D72829 Elevated white blood cell count, unspecified: Secondary | ICD-10-CM

## 2019-03-01 DIAGNOSIS — Z880 Allergy status to penicillin: Secondary | ICD-10-CM

## 2019-03-01 DIAGNOSIS — Z803 Family history of malignant neoplasm of breast: Secondary | ICD-10-CM

## 2019-03-01 DIAGNOSIS — D8989 Other specified disorders involving the immune mechanism, not elsewhere classified: Secondary | ICD-10-CM | POA: Diagnosis present

## 2019-03-01 DIAGNOSIS — Z885 Allergy status to narcotic agent status: Secondary | ICD-10-CM

## 2019-03-01 DIAGNOSIS — I341 Nonrheumatic mitral (valve) prolapse: Secondary | ICD-10-CM | POA: Diagnosis present

## 2019-03-01 DIAGNOSIS — Z794 Long term (current) use of insulin: Secondary | ICD-10-CM

## 2019-03-01 DIAGNOSIS — K219 Gastro-esophageal reflux disease without esophagitis: Secondary | ICD-10-CM | POA: Diagnosis present

## 2019-03-01 DIAGNOSIS — E1159 Type 2 diabetes mellitus with other circulatory complications: Secondary | ICD-10-CM | POA: Diagnosis not present

## 2019-03-01 DIAGNOSIS — E1169 Type 2 diabetes mellitus with other specified complication: Secondary | ICD-10-CM | POA: Diagnosis present

## 2019-03-01 DIAGNOSIS — N63 Unspecified lump in unspecified breast: Secondary | ICD-10-CM

## 2019-03-01 DIAGNOSIS — G43909 Migraine, unspecified, not intractable, without status migrainosus: Secondary | ICD-10-CM | POA: Diagnosis present

## 2019-03-01 DIAGNOSIS — Z888 Allergy status to other drugs, medicaments and biological substances status: Secondary | ICD-10-CM

## 2019-03-01 DIAGNOSIS — I517 Cardiomegaly: Secondary | ICD-10-CM | POA: Diagnosis present

## 2019-03-01 DIAGNOSIS — R0600 Dyspnea, unspecified: Secondary | ICD-10-CM

## 2019-03-01 HISTORY — DX: Other pulmonary embolism without acute cor pulmonale: I26.99

## 2019-03-01 LAB — COMPREHENSIVE METABOLIC PANEL
ALT: 32 U/L (ref 0–44)
AST: 22 U/L (ref 15–41)
Albumin: 3.2 g/dL — ABNORMAL LOW (ref 3.5–5.0)
Alkaline Phosphatase: 71 U/L (ref 38–126)
Anion gap: 12 (ref 5–15)
BUN: 16 mg/dL (ref 8–23)
CO2: 24 mmol/L (ref 22–32)
Calcium: 9.2 mg/dL (ref 8.9–10.3)
Chloride: 108 mmol/L (ref 98–111)
Creatinine, Ser: 1.15 mg/dL — ABNORMAL HIGH (ref 0.44–1.00)
GFR calc Af Amer: 57 mL/min — ABNORMAL LOW (ref >60)
GFR calc non Af Amer: 49 mL/min — ABNORMAL LOW (ref >60)
Glucose, Bld: 131 mg/dL — ABNORMAL HIGH (ref 70–99)
Potassium: 4 mmol/L (ref 3.5–5.1)
Sodium: 144 mmol/L (ref 135–145)
Total Bilirubin: 0.5 mg/dL (ref 0.3–1.2)
Total Protein: 6.7 g/dL (ref 6.5–8.1)

## 2019-03-01 LAB — POC URINALSYSI DIPSTICK (AUTOMATED)
Bilirubin, UA: NEGATIVE
Glucose, UA: POSITIVE — AB
Ketones, UA: NEGATIVE
Nitrite, UA: NEGATIVE
Protein, UA: NEGATIVE
Spec Grav, UA: 1.015 (ref 1.010–1.025)
Urobilinogen, UA: 0.2 E.U./dL
pH, UA: 5.5 (ref 5.0–8.0)

## 2019-03-01 LAB — TROPONIN I (HIGH SENSITIVITY)
Troponin I (High Sensitivity): 39 ng/L — ABNORMAL HIGH (ref <18)
Troponin I (High Sensitivity): 43 ng/L — ABNORMAL HIGH (ref <18)

## 2019-03-01 LAB — CBC WITH DIFFERENTIAL/PLATELET
Abs Immature Granulocytes: 0.22 10*3/uL — ABNORMAL HIGH (ref 0.00–0.07)
Basophils Absolute: 0.1 10*3/uL (ref 0.0–0.1)
Basophils Relative: 1 %
Eosinophils Absolute: 0 10*3/uL (ref 0.0–0.5)
Eosinophils Relative: 0 %
HCT: 43.4 % (ref 36.0–46.0)
Hemoglobin: 14.2 g/dL (ref 12.0–15.0)
Immature Granulocytes: 2 %
Lymphocytes Relative: 10 %
Lymphs Abs: 1.1 10*3/uL (ref 0.7–4.0)
MCH: 30.2 pg (ref 26.0–34.0)
MCHC: 32.7 g/dL (ref 30.0–36.0)
MCV: 92.3 fL (ref 80.0–100.0)
Monocytes Absolute: 0.8 10*3/uL (ref 0.1–1.0)
Monocytes Relative: 7 %
Neutro Abs: 9.1 10*3/uL — ABNORMAL HIGH (ref 1.7–7.7)
Neutrophils Relative %: 80 %
Platelets: 252 10*3/uL (ref 150–400)
RBC: 4.7 MIL/uL (ref 3.87–5.11)
RDW: 15 % (ref 11.5–15.5)
WBC: 11.3 10*3/uL — ABNORMAL HIGH (ref 4.0–10.5)
nRBC: 0 % (ref 0.0–0.2)

## 2019-03-01 LAB — URINALYSIS, ROUTINE W REFLEX MICROSCOPIC
Bacteria, UA: NONE SEEN
Bilirubin Urine: NEGATIVE
Glucose, UA: NEGATIVE mg/dL
Ketones, ur: NEGATIVE mg/dL
Nitrite: NEGATIVE
Protein, ur: NEGATIVE mg/dL
Specific Gravity, Urine: 1.005 (ref 1.005–1.030)
pH: 5 (ref 5.0–8.0)

## 2019-03-01 LAB — SARS CORONAVIRUS 2 BY RT PCR (HOSPITAL ORDER, PERFORMED IN ~~LOC~~ HOSPITAL LAB): SARS Coronavirus 2: NEGATIVE

## 2019-03-01 LAB — BRAIN NATRIURETIC PEPTIDE: B Natriuretic Peptide: 29.7 pg/mL (ref 0.0–100.0)

## 2019-03-01 MED ORDER — ACETAMINOPHEN 650 MG RE SUPP: 650.0000 mg | Freq: Four times a day (QID) | RECTAL | Status: DC | PRN

## 2019-03-01 MED ORDER — COLESEVELAM HCL 625 MG PO TABS
1875.0000 mg | ORAL_TABLET | Freq: Two times a day (BID) | ORAL | Status: DC
  Administered 2019-03-03: 1875 mg via ORAL
  Filled 2019-03-01 (×5): qty 3

## 2019-03-01 MED ORDER — HEPARIN (PORCINE) 25000 UT/250ML-% IV SOLN
800.0000 [IU]/h | INTRAVENOUS | Status: DC
  Administered 2019-03-01: 1000 [IU]/h via INTRAVENOUS
  Administered 2019-03-02: 800 [IU]/h via INTRAVENOUS
  Filled 2019-03-01 (×2): qty 250

## 2019-03-01 MED ORDER — GABAPENTIN 300 MG PO CAPS
300.0000 mg | ORAL_CAPSULE | Freq: Once | ORAL | Status: AC
  Administered 2019-03-01: 300 mg via ORAL
  Filled 2019-03-01: qty 1

## 2019-03-01 MED ORDER — GABAPENTIN 300 MG PO CAPS: 600.0000 mg | ORAL_CAPSULE | Freq: Every day | ORAL | Status: DC

## 2019-03-01 MED ORDER — HEPARIN SODIUM (PORCINE) 5000 UNIT/ML IJ SOLN: 4000.0000 [IU] | Freq: Once | INTRAMUSCULAR | Status: DC

## 2019-03-01 MED ORDER — IOHEXOL 300 MG/ML  SOLN
75.0000 mL | Freq: Once | INTRAMUSCULAR | Status: AC | PRN
  Administered 2019-03-01: 75 mL via INTRAVENOUS

## 2019-03-01 MED ORDER — ATORVASTATIN CALCIUM 80 MG PO TABS
80.0000 mg | ORAL_TABLET | Freq: Every day | ORAL | Status: DC
  Administered 2019-03-02: 80 mg via ORAL
  Filled 2019-03-01 (×2): qty 1

## 2019-03-01 MED ORDER — PANTOPRAZOLE SODIUM 40 MG PO TBEC
40.0000 mg | DELAYED_RELEASE_TABLET | Freq: Every day | ORAL | Status: DC
  Administered 2019-03-02 – 2019-03-03 (×2): 40 mg via ORAL
  Filled 2019-03-01 (×2): qty 1

## 2019-03-01 MED ORDER — ASPIRIN 81 MG PO CHEW
162.0000 mg | CHEWABLE_TABLET | Freq: Once | ORAL | Status: AC
  Administered 2019-03-01: 162 mg via ORAL

## 2019-03-01 MED ORDER — ACETAMINOPHEN 325 MG PO TABS: 650.0000 mg | ORAL_TABLET | Freq: Four times a day (QID) | ORAL | Status: DC | PRN

## 2019-03-01 MED ORDER — INSULIN ASPART 100 UNIT/ML ~~LOC~~ SOLN: 0.0000 [IU] | Freq: Three times a day (TID) | SUBCUTANEOUS | Status: DC

## 2019-03-01 MED ORDER — INSULIN GLARGINE 100 UNIT/ML SOLOSTAR PEN: 40.0000 [IU] | PEN_INJECTOR | Freq: Every day | SUBCUTANEOUS | Status: DC

## 2019-03-01 MED ORDER — GABAPENTIN 300 MG PO CAPS
600.0000 mg | ORAL_CAPSULE | Freq: Every day | ORAL | Status: DC
  Administered 2019-03-01 – 2019-03-02 (×2): 600 mg via ORAL
  Filled 2019-03-01 (×2): qty 2

## 2019-03-01 MED ORDER — BUDESONIDE ER 6 MG PO CP24: 1.0000 | ORAL_CAPSULE | Freq: Every day | ORAL | Status: DC

## 2019-03-01 MED ORDER — VITAMIN D 25 MCG (1000 UNIT) PO TABS
1000.0000 [IU] | ORAL_TABLET | Freq: Every day | ORAL | Status: DC
  Administered 2019-03-02 – 2019-03-03 (×2): 1000 [IU] via ORAL
  Filled 2019-03-01 (×2): qty 1

## 2019-03-01 MED ORDER — HEPARIN BOLUS VIA INFUSION
3500.0000 [IU] | Freq: Once | INTRAVENOUS | Status: AC
  Administered 2019-03-01: 3500 [IU] via INTRAVENOUS
  Filled 2019-03-01: qty 3500

## 2019-03-01 NOTE — H&P (Signed)
History and Physical    Debra Leach [id number] DOB: 06-08-1950 DOA: 03/01/2019  PCP: [name], MD Patient coming from: PCPs office  Chief Complaint: Hematuria, dyspnea  HPI: Debra Leach is a 68 y.o. female with medical history significant of nephrolithiasis, arthritis, type 2 diabetes, GERD, hyperlipidemia, mitral valve prolapse presented to the hospital for evaluation of hematuria and dyspnea.  Patient states for over 1 week she is having dyspnea on minimal exertion and is feeling lightheaded when she walks.  Reports having chronic intermittent substernal chest discomfort for several years which she thinks is related to her acid reflux, no recent change.  About 2 weeks ago she noticed that her right calf muscle was painful.  The pain finally resolved 5 days ago.  Denies history of blood clots.  She stopped smoking cigarettes 26 years ago.  No recent travel.  Denies history of malignancy.  States she went to her PCP today as this afternoon while urinating she noticed there was a little bit of blood on toilet paper when she wiped.  She then urinated again and while wiping noticed a little bit of blood on toilet paper.  Denies vaginal bleeding or rectal bleeding.  Reports history of hysterectomy.  Denies dysuria, urinary frequency, or urgency.  ED Course: Slightly tachycardic and tachypneic.  Not hypoxic.  Afebrile.  Systolic in the [id number] to [id number].  White count 11.3.  Hemoglobin 14.2.  High-sensitivity troponin 39 >43.  BNP normal.  UA not suggestive of hematuria (0-5 RBCs on microscopic examination).  SARS-CoV-2 test negative.  Chest x-ray negative for acute cardiopulmonary disease.  CT angiogram showing acute bilateral lower lobe and right upper lobe segmental and subsegmental pulmonary emboli.  RV/LV ratio of 1.5. Patient was started on heparin infusion in the ED.   Review of Systems:  All systems reviewed and apart from history of presenting illness, are negative.   Past Medical History:  Diagnosis Date  . Arthritis    osteoarthritis B knees, left hip , right elbow  . Complication of anesthesia    difficult waking  . Diabetes mellitus without complication ([place])    Diet and exercise controlled  . GERD (gastroesophageal reflux disease)   . Headache   . History of kidney stones   . Hyperlipidemia   . MVP (mitral valve prolapse)    history of  . PONV (postoperative nausea and vomiting)     Past Surgical History:  Procedure Laterality Date  . arm surgery    . BREAST BIOPSY Left   . BREAST SURGERY  2000   breast biopsy (benign)  . LITHOTRIPSY  08-12-2008   stent placed bilaterally  . PARTIAL HYSTERECTOMY     Both ovaries remain, vaginal, for mennorhagia  . TONSILLECTOMY    . TUBAL LIGATION  1980     reports that she has quit smoking. She has never used smokeless tobacco. She reports that she does not drink alcohol or use drugs.  Allergies  Allergen Reactions  . Atenolol   . Codeine     REACTION: Migraine  . Penicillins Itching    Has patient had a PCN reaction causing immediate rash, facial/tongue/throat swelling, SOB or lightheadedness with hypotension: No Has patient had a PCN reaction causing severe rash involving mucus membranes or skin necrosis: No Has patient had a PCN reaction that required hospitalization: No Has patient had a PCN reaction occurring within the last 10 years: No If all of the above answers are "NO", then may proceed with Cephalosporin  use.   . Pentazocine Lactate     REACTION: Swelling, itching, rash  . Crestor [Rosuvastatin] Other (See Comments)  . Sudafed [Pseudoephedrine Hcl] Itching and Anxiety    Family History  Problem Relation Age of Onset  . Cancer Mother        bone  . Hypertension Father   . Mitral valve prolapse Father   . Asthma Brother   . Arthritis Brother   . Nephrolithiasis Brother   . Nephrolithiasis Brother   . Asthma Brother   . Arthritis Brother   . Aortic aneurysm Brother         ascending aortic aneuysm  . Breast cancer Maternal Aunt   . Breast cancer Maternal Aunt     Prior to Admission medications   Medication Sig Start Date End Date Taking? Authorizing Provider  atorvastatin (LIPITOR) 80 MG tablet Take 1 tablet (80 mg total) by mouth daily. 03/31/18   Bedsole, Amy E, MD  Budesonide ER 6 MG CP24 Take 1 tablet by mouth daily.    [provider]  cholecalciferol (VITAMIN D) 1000 units tablet Take 1,000 Units by mouth daily.    [provider]  clotrimazole-betamethasone (LOTRISONE) cream Apply topically 2 (two) times daily as needed. 10/13/18   [name], MD  colesevelam (WELCHOL) 625 MG tablet Take 3 tablets (1,875 mg total) by mouth 2 (two) times daily with a meal. 03/31/18   Bedsole, Amy E, MD  gabapentin (NEURONTIN) 100 MG capsule Take 1 capsule (100 mg total) by mouth 3 (three) times daily as needed. Up to three times a day 12/25/18   Pieter Partridge, DO  gabapentin (NEURONTIN) 300 MG capsule Take 1 capsule (300 mg total) by mouth 2 (two) times daily. 12/25/18   Tomi Likens, Adam R, DO  Galcanezumab-gnlm (EMGALITY) 120 MG/ML SOSY Inject 120 mg into the skin every 30 (thirty) days. 06/28/18   Pieter Partridge, DO  Insulin Glargine (LANTUS SOLOSTAR) 100 UNIT/ML Solostar Pen Inject 40 Units into the skin daily.    [provider]  Insulin Pen Needle (ULTICARE SHORT PEN NEEDLES) 31G X 8 MM MISC USE TO INJECT INSULIN DAILY 02/09/19   Bedsole, Amy E, MD  Lancets (ONETOUCH DELICA PLUS [id number]) MISC USE TO CHECK BLOOD SUGAR TWO TIMES DAILY 02/09/19   Bedsole, Amy E, MD  omeprazole (PRILOSEC) 20 MG capsule Take 20 mg by mouth every morning.    [provider]  OnabotulinumtoxinA (BOTOX IJ) Inject 155 Units as directed.     [provider]  ONETOUCH VERIO test strip USE TO CHECK BLOOD SUGAR TWO TIMES A DAY 02/09/19   Bedsole, Amy E, MD  promethazine (PHENERGAN) 12.5 MG tablet Take 12.5 mg by mouth every 6 (six) hours as needed for nausea or  vomiting.    [provider]  Riboflavin (VITAMIN B2 PO) Take 400 mg by mouth daily.    [provider]  Ubrogepant (UBRELVY) 100 MG TABS Take 1 tablet by mouth as needed (May repeat dose after 2 hours if needed.  Maximum 2 tablets in 24 hours). 01/08/19   Pieter Partridge, DO    Physical Exam: Vitals:   03/01/19 1912 03/01/19 1945 03/01/19 2219 03/01/19 2230  BP: (!) 154/94 (!) 158/97  (!) 154/88  Pulse: (!) 110 (!) 121 (!) 109 (!) 119  Resp: 18 (!) 24 17 (!) 25  Temp:      TempSrc:      SpO2: 94% 90% 95% 95%  Weight:  Height:        Physical Exam  Constitutional: She is oriented to person, place, and time. She appears well-developed and well-nourished. No distress.  HENT:  Head: Normocephalic.  Mouth/Throat: Oropharynx is clear and moist.  Eyes: Right eye exhibits no discharge. Left eye exhibits no discharge.  Neck: Neck supple.  Cardiovascular: Normal rate, regular rhythm and intact distal pulses.  Pulmonary/Chest: Effort normal and breath sounds normal. No respiratory distress. She has no wheezes. She has no rales.  Abdominal: Soft. Bowel sounds are normal. She exhibits no distension. There is no abdominal tenderness. There is no guarding.  Musculoskeletal:        General: No edema.     Comments: Bilateral lower extremities symmetric in size.  No edema, tenderness, increased warmth to touch, or erythema.  Neurological: She is alert and oriented to person, place, and time.  Skin: Skin is warm and dry. She is not diaphoretic.     Labs on Admission: I have personally reviewed following labs and imaging studies  CBC: Recent Labs  Lab 03/01/19 1822  WBC 11.3*  NEUTROABS 9.1*  HGB 14.2  HCT 43.4  MCV 92.3  PLT [id number]   Basic Metabolic Panel: Recent Labs  Lab 03/01/19 1822  NA 144  K 4.0  CL 108  CO2 24  GLUCOSE 131*  BUN 16  CREATININE 1.15*  CALCIUM 9.2   GFR: Estimated Creatinine Clearance: 41.7 mL/min (A) (by C-G formula based on SCr of  1.15 mg/dL (H)). Liver Function Tests: Recent Labs  Lab 03/01/19 1822  AST 22  ALT 32  ALKPHOS 71  BILITOT 0.5  PROT 6.7  ALBUMIN 3.2*   No results for input(s): LIPASE, AMYLASE in the last 168 hours. No results for input(s): AMMONIA in the last 168 hours. Coagulation Profile: No results for input(s): INR, PROTIME in the last 168 hours. Cardiac Enzymes: No results for input(s): CKTOTAL, CKMB, CKMBINDEX, TROPONINI in the last 168 hours. BNP (last 3 results) No results for input(s): PROBNP in the last 8760 hours. HbA1C: No results for input(s): HGBA1C in the last 72 hours. CBG: No results for input(s): GLUCAP in the last 168 hours. Lipid Profile: No results for input(s): CHOL, HDL, LDLCALC, TRIG, CHOLHDL, LDLDIRECT in the last 72 hours. Thyroid Function Tests: No results for input(s): TSH, T4TOTAL, FREET4, T3FREE, THYROIDAB in the last 72 hours. Anemia Panel: No results for input(s): VITAMINB12, FOLATE, FERRITIN, TIBC, IRON, RETICCTPCT in the last 72 hours. Urine analysis:    Component Value Date/Time   COLORURINE YELLOW 03/01/2019 1820   APPEARANCEUR HAZY (A) 03/01/2019 1820   LABSPEC 1.005 03/01/2019 1820   PHURINE 5.0 03/01/2019 1820   GLUCOSEU NEGATIVE 03/01/2019 1820   HGBUR MODERATE (A) 03/01/2019 1820   BILIRUBINUR NEGATIVE 03/01/2019 1820   BILIRUBINUR Negative 03/01/2019 [place] 03/01/2019 1820   PROTEINUR NEGATIVE 03/01/2019 1820   UROBILINOGEN 0.2 03/01/2019 1551   NITRITE NEGATIVE 03/01/2019 1820   LEUKOCYTESUR SMALL (A) 03/01/2019 1820    Radiological Exams on Admission: Ct Angio Chest Pe W And/or Wo Contrast  Result Date: 03/01/2019 CLINICAL DATA:  Increasing shortness of breath and lightheadedness for the past week. EXAM: CT ANGIOGRAPHY CHEST WITH CONTRAST TECHNIQUE: Multidetector CT imaging of the chest was performed using the standard protocol during bolus administration of intravenous contrast. Multiplanar CT image reconstructions  and MIPs were obtained to evaluate the vascular anatomy. CONTRAST:  16mL OMNIPAQUE IOHEXOL 300 MG/ML  SOLN COMPARISON:  Chest x-ray from same day. FINDINGS: Cardiovascular:  Satisfactory opacification of the pulmonary arteries to the segmental level. Acute bilateral lower lobe and right upper lobe segmental and subsegmental pulmonary emboli. Normal heart size. RV/LV ratio of 1.5. No pericardial effusion. No thoracic aortic aneurysm or dissection. Mild atherosclerotic calcification of the aortic arch. Mediastinum/Nodes: No enlarged mediastinal, hilar, or axillary lymph nodes. Calcified right hilar lymph nodes. 8 mm hypodense nodule in the right thyroid lobe. The trachea and esophagus demonstrate no significant findings. Small hiatal hernia. Lungs/Pleura: Subsegmental atelectasis at the lung bases. No focal consolidation, pleural effusion, or pneumothorax. No suspicious pulmonary nodule. Upper Abdomen: No acute abnormality. Musculoskeletal: 1.2 cm nodule in the left breast. No acute or significant osseous findings. Elevation of the right breast. Review of the MIP images confirms the above findings. IMPRESSION: 1. Acute bilateral lower lobe and right upper lobe segmental and subsegmental pulmonary emboli. 2. 1.2 cm left breast nodule.  Correlate with mammographic history. 3.  Aortic atherosclerosis (ICD10-I70.0). Critical Value/emergent results were called by telephone at the time of interpretation on 03/01/2019 at 9:07 pm to provider [facility], who verbally acknowledged these results. Electronically Signed   By: Titus Dubin M.D.   On: 03/01/2019 21:08   Dg Chest Port 1 View  Result Date: 03/01/2019 CLINICAL DATA:  68 year old female with dyspnea EXAM: PORTABLE CHEST 1 VIEW COMPARISON:  August 12, 2008 FINDINGS: Cardiomediastinal silhouette unchanged in size and contour. Interval worsening of asymmetric elevation of the right hemidiaphragm. No pneumothorax. No pleural effusion. No confluent airspace disease.  Mild coarsening of interstitial markings. No displaced fracture IMPRESSION: Negative for acute cardiopulmonary disease. Interval worsening of asymmetric elevation of the right hemidiaphragm. Electronically Signed   By: Corrie Mckusick D.O.   On: 03/01/2019 18:49    EKG: Independently reviewed.  Sinus tachycardia, heart rate 116.  Assessment/Plan Principal Problem:   Acute pulmonary embolism (HCC) Active Problems:   Hyperlipidemia   Diabetes mellitus with no complication (HCC)   Leukocytosis   Breast nodule   Acute pulmonary embolism Suspect related to right lower extremity DVT based on history provided by the patient. Slightly tachycardic and tachypneic.  Not hypoxic. CT angiogram showing acute bilateral lower lobe and right upper lobe segmental and subsegmental pulmonary emboli.  RV/LV ratio of 1.5.  Not hypotensive.  BNP normal.  High-sensitivity troponin mildly elevated 39 >43. -Cardiac monitoring -Continue heparin infusion for anticoagulation at this time with plan to transition to DOAC upon discharge -Echocardiogram -Lower extremity Dopplers -Continue to trend troponin -Continuous pulse ox, supplemental oxygen if needed  ?Hematuria Patient reports noticing a small amount of blood on her toilet paper after urinating this afternoon.  Hemoglobin stable at 14.2.  UA not suggestive of hematuria.  She denies vaginal bleeding and reports history of hysterectomy.  Denies rectal bleeding. -Continue to monitor  Mild leukocytosis Likely reactive.  White count 11.3.  Chest imaging without evidence of pneumonia.  Not endorsing UTI symptoms.  UA not suggestive of infection (small amount of leukocytes, negative nitrite, 0-5 WBCs, and no bacteria seen on microscopic examination). -Continue to monitor CBC  Breast nodule CT showing 1.2 cm left breast nodule. -Ensure PCP follow-up for mammography.  Insulin-dependent diabetes mellitus Last A1c 7.8 on 10/09/2018. -Repeat A1c.  Sliding scale insulin  sensitive and CBG checks.  Continue home Lantus 40 units daily.  Hyperlipidemia -Continue home Lipitor  GERD -Continue PPI  DVT prophylaxis: Heparin Code Status: Patient wishes to be full code. Family Communication: No family available. Disposition Plan: Anticipate discharge after clinical improvement. Consults called: None Admission  status: It is my clinical opinion that referral for OBSERVATION is reasonable and necessary in this patient based on the above information provided. The aforementioned taken together are felt to place the patient at high risk for further clinical deterioration. However it is anticipated that the patient may be medically stable for discharge from the hospital within 24 to 48 hours.  The medical decision making on this patient was of high complexity and the patient is at high risk for clinical deterioration, therefore this is a level 3 visit.  Shela Leff MD [facility] Pager [phone]  If 7PM-7AM, please contact night-coverage [email or web address] Password [facility]  03/01/2019, 10:49 PM

## 2019-03-01 NOTE — ED Notes (Signed)
RN attempted to call report [phone] 

## 2019-03-01 NOTE — ED Triage Notes (Signed)
Ems reports the pt is coming from [facility]. Presenting with hematuria(today), nausea, shortness of breath(last week). 160/80, 120hr, rr 18, spo2 100%ra, cbg 268.

## 2019-03-01 NOTE — ED Notes (Signed)
Heparin signed off with mike rn

## 2019-03-01 NOTE — ED Provider Notes (Signed)
[facility] EMERGENCY DEPARTMENT Provider Note   CSN: [id number] Arrival date & time: 03/01/19  1742     History   Chief Complaint Chief Complaint  Patient presents with   Hematuria    HPI Debra Leach is a 68 y.o. female.     68 year old female with prior medical history as detailed below presents for evaluation of 2 problems.  Patient reports that her primary issue that caused her to go see her primary care provider today was that she noticed bloody urine.  She reports that she urinated this morning and as she was wiping she saw blood on the tissue.  This caused her to go see her primary care provider.  While at her PCP she reported concurrent feelings of dyspnea that have been ongoing for the last week.  This is exacerbated by exertion.  She denies associated chest pain or fever.  She denies known exposure to COVID positive patient.  She denies prior history of significant cardiac disease.  She denies associated hemoptysis, bloody stools, or other bleeding events.  She is not on any blood thinners.  The history is provided by the patient and medical records.  Hematuria This is a new problem. The current episode started 6 to 12 hours ago. The problem occurs rarely. The problem has not changed since onset.Associated symptoms include shortness of breath. Pertinent negatives include no chest pain. Nothing aggravates the symptoms. Nothing relieves the symptoms.    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   Eustachian tube disorder, bilateral 12/16/2016   Vertigo 12/16/2016   Esophageal dysphagia 11/22/2016   Chronic low back pain 10/07/2016   Vision changes 08/12/2016   Aortic atherosclerosis ([place]) 06/11/2016   Supraclavicular fossa fullness 06/08/2016   Family history of aortic aneurysm 08/20/2014   Vitamin D deficiency 08/20/2014   Diabetes mellitus with no complication ([place]) [id number]   Osteopenia 11/06/2009   Hyperlipidemia 07/05/2008   INSOMNIA, CHRONIC 07/05/2008   Migraine with aura 07/05/2008   GERD 07/05/2008   OSTEOARTHRITIS 07/05/2008   MITRAL VALVE PROLAPSE, HX OF 07/05/2008   NEPHROLITHIASIS, HX OF 07/05/2008    Past Surgical History:  Procedure Laterality Date   arm surgery     BREAST BIOPSY Left    BREAST SURGERY  2000   breast biopsy (benign)   LITHOTRIPSY  08-12-2008   stent placed bilaterally   PARTIAL HYSTERECTOMY     Both ovaries remain, vaginal, for mennorhagia   TONSILLECTOMY     TUBAL LIGATION  1980     OB History   No obstetric history on file.      Home Medications    Prior to Admission medications   Medication Sig Start Date End Date Taking? Authorizing Provider  atorvastatin (LIPITOR) 80 MG tablet Take 1 tablet (80 mg total) by mouth daily. 03/31/18   Bedsole, Amy E, MD  Budesonide ER 6 MG CP24  Take 1 tablet by mouth daily.    [provider]  cholecalciferol (VITAMIN D) 1000 units tablet Take 1,000 Units by mouth daily.    [provider]  clotrimazole-betamethasone (LOTRISONE) cream Apply topically 2 (two) times daily as needed. 10/13/18   [name], MD  colesevelam (WELCHOL) 625 MG tablet Take 3 tablets (1,875 mg total) by mouth 2 (two) times daily with a meal. 03/31/18   Bedsole, Amy E, MD  gabapentin (NEURONTIN) 100 MG capsule Take 1 capsule (100 mg total) by mouth 3 (three) times daily as needed. Up to three  times a day 12/25/18   Pieter Partridge, DO  gabapentin (NEURONTIN) 300 MG capsule Take 1 capsule (300 mg total) by mouth 2 (two) times daily. 12/25/18   Tomi Likens, Adam R, DO  Galcanezumab-gnlm (EMGALITY) 120 MG/ML SOSY Inject 120 mg into the skin every 30 (thirty) days. 06/28/18   Pieter Partridge, DO  Insulin Glargine (LANTUS SOLOSTAR) 100 UNIT/ML Solostar Pen Inject 40 Units into the skin daily.    [provider]  Insulin Pen Needle (ULTICARE SHORT PEN NEEDLES) 31G X 8 MM MISC USE TO INJECT INSULIN DAILY 02/09/19   Bedsole, Amy E, MD  Lancets (ONETOUCH DELICA PLUS [id number]) MISC USE TO CHECK BLOOD SUGAR TWO TIMES DAILY 02/09/19   Bedsole, Amy E, MD  omeprazole (PRILOSEC) 20 MG capsule Take 20 mg by mouth every morning.    [provider]  OnabotulinumtoxinA (BOTOX IJ) Inject 155 Units as directed.     [provider]  ONETOUCH VERIO test strip USE TO CHECK BLOOD SUGAR TWO TIMES A DAY 02/09/19   Bedsole, Amy E, MD  promethazine (PHENERGAN) 12.5 MG tablet Take 12.5 mg by mouth every 6 (six) hours as needed for nausea or vomiting.    [provider]  Riboflavin (VITAMIN B2 PO) Take 400 mg by mouth daily.    [provider]  Ubrogepant (UBRELVY) 100 MG TABS Take 1 tablet by mouth as needed (May repeat dose after 2 hours if needed.  Maximum 2 tablets in 24 hours). 01/08/19   Pieter Partridge, DO    Family History Family History  Problem Relation Age of Onset   Cancer Mother        bone   Hypertension Father    Mitral valve prolapse Father    Asthma Brother    Arthritis Brother    Nephrolithiasis Brother    Nephrolithiasis Brother    Asthma Brother    Arthritis Brother    Aortic aneurysm Brother        ascending aortic aneuysm   Breast cancer Maternal Aunt    Breast cancer Maternal Aunt     Social History Social History   Tobacco Use   Smoking status: Former Smoker   Smokeless tobacco: Never Used  Substance Use Topics   Alcohol use: No     Frequency: Never   Drug use: No     Allergies   Atenolol, Codeine, Penicillins, Pentazocine lactate, Crestor [rosuvastatin], and Sudafed [pseudoephedrine hcl]   Review of Systems Review of Systems  Respiratory: Positive for shortness of breath.   Cardiovascular: Negative for chest pain.  Genitourinary: Positive for hematuria.  All other systems reviewed and are negative.    Physical Exam Updated Vital Signs BP (!) 173/101 (BP Location: Right Arm)    Pulse (!) 113    Temp 98.9 F (37.2 C) (Oral)    Resp (!) 22    Ht 5\' 1"  (1.549 m)  Wt 67.6 kg    SpO2 96%    BMI 28.15 kg/m   Physical Exam Vitals signs and nursing note reviewed.  Constitutional:      General: She is not in acute distress.    Appearance: Normal appearance. She is well-developed.  HENT:     Head: Normocephalic and atraumatic.  Eyes:     Conjunctiva/sclera: Conjunctivae normal.     Pupils: Pupils are equal, round, and reactive to light.  Neck:     Musculoskeletal: Normal range of motion and neck supple.  Cardiovascular:     Rate and Rhythm: Normal rate and regular rhythm.     Heart sounds: Normal heart sounds.  Pulmonary:     Effort: Pulmonary effort is normal. No respiratory distress.     Breath sounds: Normal breath sounds.  Abdominal:     General: There is no distension.     Palpations: Abdomen is soft.     Tenderness: There is no abdominal tenderness.  Musculoskeletal: Normal range of motion.        General: No deformity.  Skin:    General: Skin is warm and dry.  Neurological:     Mental Status: She is alert and oriented to person, place, and time.      ED Treatments / Results  Labs (all labs ordered are listed, but only abnormal results are displayed) Labs Reviewed  URINALYSIS, ROUTINE W REFLEX MICROSCOPIC - Abnormal; Notable for the following components:      Result Value   APPearance HAZY (*)    Hgb urine dipstick MODERATE (*)    Leukocytes,Ua SMALL (*)    All other components  within normal limits  COMPREHENSIVE METABOLIC PANEL - Abnormal; Notable for the following components:   Glucose, Bld 131 (*)    Creatinine, Ser 1.15 (*)    Albumin 3.2 (*)    GFR calc non Af Amer 49 (*)    GFR calc Af Amer 57 (*)    All other components within normal limits  CBC WITH DIFFERENTIAL/PLATELET - Abnormal; Notable for the following components:   WBC 11.3 (*)    Neutro Abs 9.1 (*)    Abs Immature Granulocytes 0.22 (*)    All other components within normal limits  TROPONIN I (HIGH SENSITIVITY) - Abnormal; Notable for the following components:   Troponin I (High Sensitivity) 39 (*)    All other components within normal limits  SARS CORONAVIRUS 2 (HOSPITAL ORDER, [facility])  BRAIN NATRIURETIC PEPTIDE  TROPONIN I (HIGH SENSITIVITY)    EKG EKG Interpretation  Date/Time:  Thursday March 01 2019 17:42:42 EDT Ventricular Rate:  116 PR Interval:    QRS Duration: 71 QT Interval:  305 QTC Calculation: 424 R Axis:   22 Text Interpretation:  Sinus tachycardia Anterior infarct, old Confirmed by Dene Gentry [phone]) on 03/01/2019 5:47:32 PM   Radiology Ct Angio Chest Pe W And/or Wo Contrast  Result Date: 03/01/2019 CLINICAL DATA:  Increasing shortness of breath and lightheadedness for the past week. EXAM: CT ANGIOGRAPHY CHEST WITH CONTRAST TECHNIQUE: Multidetector CT imaging of the chest was performed using the standard protocol during bolus administration of intravenous contrast. Multiplanar CT image reconstructions and MIPs were obtained to evaluate the vascular anatomy. CONTRAST:  72mL OMNIPAQUE IOHEXOL 300 MG/ML  SOLN COMPARISON:  Chest x-ray from same day. FINDINGS: Cardiovascular: Satisfactory opacification of the pulmonary arteries to the segmental level. Acute bilateral lower lobe and right upper lobe segmental and subsegmental pulmonary emboli. Normal heart size.  RV/LV ratio of 1.5. No pericardial effusion. No thoracic aortic aneurysm or  dissection. Mild atherosclerotic calcification of the aortic arch. Mediastinum/Nodes: No enlarged mediastinal, hilar, or axillary lymph nodes. Calcified right hilar lymph nodes. 8 mm hypodense nodule in the right thyroid lobe. The trachea and esophagus demonstrate no significant findings. Small hiatal hernia. Lungs/Pleura: Subsegmental atelectasis at the lung bases. No focal consolidation, pleural effusion, or pneumothorax. No suspicious pulmonary nodule. Upper Abdomen: No acute abnormality. Musculoskeletal: 1.2 cm nodule in the left breast. No acute or significant osseous findings. Elevation of the right breast. Review of the MIP images confirms the above findings. IMPRESSION: 1. Acute bilateral lower lobe and right upper lobe segmental and subsegmental pulmonary emboli. 2. 1.2 cm left breast nodule.  Correlate with mammographic history. 3.  Aortic atherosclerosis (ICD10-I70.0). Critical Value/emergent results were called by telephone at the time of interpretation on 03/01/2019 at 9:07 pm to provider [facility], who verbally acknowledged these results. Electronically Signed   By: Titus Dubin M.D.   On: 03/01/2019 21:08   Dg Chest Port 1 View  Result Date: 03/01/2019 CLINICAL DATA:  68 year old female with dyspnea EXAM: PORTABLE CHEST 1 VIEW COMPARISON:  August 12, 2008 FINDINGS: Cardiomediastinal silhouette unchanged in size and contour. Interval worsening of asymmetric elevation of the right hemidiaphragm. No pneumothorax. No pleural effusion. No confluent airspace disease. Mild coarsening of interstitial markings. No displaced fracture IMPRESSION: Negative for acute cardiopulmonary disease. Interval worsening of asymmetric elevation of the right hemidiaphragm. Electronically Signed   By: Corrie Mckusick D.O.   On: 03/01/2019 18:49    Procedures Procedures (including critical care time)  Medications Ordered in ED Medications - No data to display   Initial Impression / Assessment and Plan / ED  Course  I have reviewed the triage vital signs and the nursing notes.  Pertinent labs & imaging results that were available during my care of the patient were reviewed by me and considered in my medical decision making (see chart for details).        MDM  Screen complete  Debra Leach was evaluated in Emergency Department on 03/01/2019 for the symptoms described in the history of present illness. She was evaluated in the context of the global COVID-19 pandemic, which necessitated consideration that the patient might be at risk for infection with the SARS-CoV-2 virus that causes COVID-19. Institutional protocols and algorithms that pertain to the evaluation of patients at risk for COVID-19 are in a state of rapid change based on information released by regulatory bodies including the CDC and federal and state organizations. These policies and algorithms were followed during the patient's care in the ED.   Patient is presenting for evaluation of hematuria and shortness of breath.  No evidence found of hematuria on work-up today.  Patient is noted to be mildly tachypneic and tachycardic.  Work-up reveals evidence of PE.  She will require anticoagulation and admission for further work-up and treatment.  Hospitalist service is aware of case and will evaluate for admission.  Final Clinical Impressions(s) / ED Diagnoses   Final diagnoses:  Pulmonary embolism, other, unspecified chronicity, unspecified whether acute cor pulmonale present [facility])    ED Discharge Orders    None       Valarie Merino, MD 03/01/19 2117

## 2019-03-01 NOTE — Progress Notes (Signed)
ANTICOAGULATION CONSULT NOTE - Initial Consult  Pharmacy Consult for heparin Indication: pulmonary embolus  Allergies  Allergen Reactions  . Atenolol   . Codeine     REACTION: Migraine  . Penicillins Itching    Has patient had a PCN reaction causing immediate rash, facial/tongue/throat swelling, SOB or lightheadedness with hypotension: No Has patient had a PCN reaction causing severe rash involving mucus membranes or skin necrosis: No Has patient had a PCN reaction that required hospitalization: No Has patient had a PCN reaction occurring within the last 10 years: No If all of the above answers are "NO", then may proceed with Cephalosporin use.   Marland Kitchen Pentazocine Lactate     REACTION: Swelling, itching, rash  . Crestor [Rosuvastatin] Other (See Comments)  . Sudafed [Pseudoephedrine Hcl] Itching and Anxiety    Patient Measurements: Height: 5\' 1"  (154.9 cm) Weight: 149 lb (67.6 kg) IBW/kg (Calculated) : 47.8 Heparin Dosing Weight: 62.1kg  Vital Signs: Temp: 98.9 F (37.2 C) (09/24 1746) Temp Source: Oral (09/24 1746) BP: 154/94 (09/24 1912) Pulse Rate: 110 (09/24 1912)  Labs: Recent Labs    03/01/19 1822  HGB 14.2  HCT 43.4  PLT 252  CREATININE 1.15*  TROPONINIHS 39*    Estimated Creatinine Clearance: 41.7 mL/min (A) (by C-G formula based on SCr of 1.15 mg/dL (H)).   Medical History: Past Medical History:  Diagnosis Date  . Arthritis    osteoarthritis B knees, left hip , right elbow  . Complication of anesthesia    difficult waking  . Diabetes mellitus without complication ([place])    Diet and exercise controlled  . GERD (gastroesophageal reflux disease)   . Headache   . History of kidney stones   . Hyperlipidemia   . MVP (mitral valve prolapse)    history of  . PONV (postoperative nausea and vomiting)     Medications:  Infusions:  . heparin      Assessment: 54 yof presented to the ED with hematuria and SOB. Found to have a bilateral PE. To start IV  heparin. Baseline CBC is WNL and she is not on anticoagulation PTA.   Goal of Therapy:  Heparin level 0.3-0.7 units/ml Monitor platelets by anticoagulation protocol: Yes   Plan:  Heparin bolus 3500 units IV x 1 Heparin gtt 1000 units/hr Check a 6 hr heparin level Daily heparin level and CBC F/u S&S of bleeding and hematuria  Elgar Scoggins, Rande Lawman 03/01/2019,9:18 PM

## 2019-03-01 NOTE — ED Notes (Signed)
ED TO INPATIENT HANDOFF REPORT  ED Nurse Name and Phone #: [phone] Threasa Beards, RN  S Name/Age/Gender Debra Leach 68 y.o. female Room/Bed: 031C/031C  Code Status   Code Status: Full Code  Home/SNF/Other Home Patient oriented to: self, place, time and situation Is this baseline? Yes   Triage Complete: Triage complete  Chief Complaint Tachy 130's, SHOB, CP  Triage Note Ems reports the pt is coming from [facility]. Presenting with hematuria(today), nausea, shortness of breath(last week). 160/80, 120hr, rr 18, spo2 100%ra, cbg 268.    Allergies Allergies  Allergen Reactions  . Atenolol   . Codeine     REACTION: Migraine  . Penicillins Itching    Has patient had a PCN reaction causing immediate rash, facial/tongue/throat swelling, SOB or lightheadedness with hypotension: No Has patient had a PCN reaction causing severe rash involving mucus membranes or skin necrosis: No Has patient had a PCN reaction that required hospitalization: No Has patient had a PCN reaction occurring within the last 10 years: No If all of the above answers are "NO", then may proceed with Cephalosporin use.   Marland Kitchen Pentazocine Lactate     REACTION: Swelling, itching, rash  . Crestor [Rosuvastatin] Other (See Comments)  . Sudafed [Pseudoephedrine Hcl] Itching and Anxiety    Level of Care/Admitting Diagnosis ED Disposition    ED Disposition Condition Comment   Admit  Hospital Area: [place] [[id number]]  Level of Care: Progressive [102]  I expect the patient will be discharged within 24 hours: No (not a candidate for 5C-Observation unit)  Covid Evaluation: Asymptomatic Screening Protocol (No Symptoms)  Diagnosis: Acute pulmonary embolism [facility]) [id number]  Admitting Physician: Shela Leff [id number]  Attending Physician: Shela Leff [id number]  PT Class (Do Not Modify): Observation [104]  PT Acc Code (Do Not Modify): Observation [10022]        B Medical/Surgery History Past Medical History:  Diagnosis Date  . Arthritis    osteoarthritis B knees, left hip , right elbow  . Complication of anesthesia    difficult waking  . Diabetes mellitus without complication ([place])    Diet and exercise controlled  . GERD (gastroesophageal reflux disease)   . Headache   . History of kidney stones   . Hyperlipidemia   . MVP (mitral valve prolapse)    history of  . PONV (postoperative nausea and vomiting)    Past Surgical History:  Procedure Laterality Date  . arm surgery    . BREAST BIOPSY Left   . BREAST SURGERY  2000   breast biopsy (benign)  . LITHOTRIPSY  08-12-2008   stent placed bilaterally  . PARTIAL HYSTERECTOMY     Both ovaries remain, vaginal, for mennorhagia  . TONSILLECTOMY    . TUBAL LIGATION  1980     A IV Location/Drains/Wounds Patient Lines/Drains/Airways Status   Active Line/Drains/Airways    Name:   Placement date:   Placement time:   Site:   Days:   Peripheral IV 03/01/19 Anterior;Right Hand   03/01/19    1743    Hand   less than 1   Peripheral IV 03/01/19 Right Forearm   03/01/19    1823    Forearm   less than 1          Intake/Output Last 24 hours No intake or output data in the 24 hours ending 03/01/19 2325  Labs/Imaging Results for orders placed or performed during the hospital encounter of 03/01/19 (from the past 48 hour(s))  Urinalysis, Routine  w reflex microscopic     Status: Abnormal   Collection Time: 03/01/19  6:20 PM  Result Value Ref Range   Color, Urine YELLOW YELLOW   APPearance HAZY (A) CLEAR   Specific Gravity, Urine 1.005 1.005 - 1.030   pH 5.0 5.0 - 8.0   Glucose, UA NEGATIVE NEGATIVE mg/dL   Hgb urine dipstick MODERATE (A) NEGATIVE   Bilirubin Urine NEGATIVE NEGATIVE   Ketones, ur NEGATIVE NEGATIVE mg/dL   Protein, ur NEGATIVE NEGATIVE mg/dL   Nitrite NEGATIVE NEGATIVE   Leukocytes,Ua SMALL (A) NEGATIVE   RBC / HPF 0-5 0 - 5 RBC/hpf   WBC, UA 0-5 0 - 5 WBC/hpf    Bacteria, UA NONE SEEN NONE SEEN   Squamous Epithelial / LPF 0-5 0 - 5   Mucus PRESENT     Comment: Performed at [facility], [address], [place], [place] [address]  Troponin I (High Sensitivity)     Status: Abnormal   Collection Time: 03/01/19  6:22 PM  Result Value Ref Range   Troponin I (High Sensitivity) 39 (H) <18 ng/L    Comment: (NOTE) Elevated high sensitivity troponin I (hsTnI) values and significant  changes across serial measurements may suggest ACS but many other  chronic and acute conditions are known to elevate hsTnI results.  Refer to the "Links" section for chest pain algorithms and additional  guidance. Performed at [facility], [place] [address], [place], [place] [address]   Comprehensive metabolic panel     Status: Abnormal   Collection Time: 03/01/19  6:22 PM  Result Value Ref Range   Sodium 144 135 - 145 mmol/L   Potassium 4.0 3.5 - 5.1 mmol/L   Chloride 108 98 - 111 mmol/L   CO2 24 22 - 32 mmol/L   Glucose, Bld 131 (H) 70 - 99 mg/dL   BUN 16 8 - 23 mg/dL   Creatinine, Ser 1.15 (H) 0.44 - 1.00 mg/dL   Calcium 9.2 8.9 - 10.3 mg/dL   Total Protein 6.7 6.5 - 8.1 g/dL   Albumin 3.2 (L) 3.5 - 5.0 g/dL   AST 22 15 - 41 U/L   ALT 32 0 - 44 U/L   Alkaline Phosphatase 71 38 - 126 U/L   Total Bilirubin 0.5 0.3 - 1.2 mg/dL   GFR calc non Af Amer 49 (L) >60 mL/min   GFR calc Af Amer 57 (L) >60 mL/min   Anion gap 12 5 - 15    Comment: Performed at [facility] [address] [place], [place] [address]  CBC with Differential     Status: Abnormal   Collection Time: 03/01/19  6:22 PM  Result Value Ref Range   WBC 11.3 (H) 4.0 - 10.5 K/uL   RBC 4.70 3.87 - 5.11 MIL/uL   Hemoglobin 14.2 12.0 - 15.0 g/dL   HCT 43.4 36.0 - 46.0 %   MCV 92.3 80.0 - 100.0 fL   MCH 30.2 26.0 - 34.0 pg   MCHC 32.7 30.0 - 36.0 g/dL   RDW 15.0 11.5 - 15.5 %   Platelets 252 150 - 400 K/uL   nRBC 0.0 0.0 - 0.2 %   Neutrophils Relative % 80 %   Neutro Abs 9.1 (H) 1.7  - 7.7 K/uL   Lymphocytes Relative 10 %   Lymphs Abs 1.1 0.7 - 4.0 K/uL   Monocytes Relative 7 %   Monocytes Absolute 0.8 0.1 - 1.0 K/uL   Eosinophils Relative 0 %  Eosinophils Absolute 0.0 0.0 - 0.5 K/uL   Basophils Relative 1 %   Basophils Absolute 0.1 0.0 - 0.1 K/uL   Immature Granulocytes 2 %   Abs Immature Granulocytes 0.22 (H) 0.00 - 0.07 K/uL    Comment: Performed at [facility] [address] [place], [place] [address]  SARS Coronavirus 2 [facility] order, Performed in [facility] hospital lab) Nasopharyngeal Nasopharyngeal Swab     Status: None   Collection Time: 03/01/19  6:22 PM   Specimen: Nasopharyngeal Swab  Result Value Ref Range   SARS Coronavirus 2 NEGATIVE NEGATIVE    Comment: (NOTE) If result is NEGATIVE SARS-CoV-2 target nucleic acids are NOT DETECTED. The SARS-CoV-2 RNA is generally detectable in upper and lower  respiratory specimens during the acute phase of infection. The lowest  concentration of SARS-CoV-2 viral copies this assay can detect is 250  copies / mL. A negative result does not preclude SARS-CoV-2 infection  and should not be used as the sole basis for treatment or other  patient management decisions.  A negative result may occur with  improper specimen collection / handling, submission of specimen other  than nasopharyngeal swab, presence of viral mutation(s) within the  areas targeted by this assay, and inadequate number of viral copies  (<250 copies / mL). A negative result must be combined with clinical  observations, patient history, and epidemiological information. If result is POSITIVE SARS-CoV-2 target nucleic acids are DETECTED. The SARS-CoV-2 RNA is generally detectable in upper and lower  respiratory specimens dur ing the acute phase of infection.  Positive  results are indicative of active infection with SARS-CoV-2.  Clinical  correlation with patient history and other diagnostic information is  necessary to determine  patient infection status.  Positive results do  not rule out bacterial infection or co-infection with other viruses. If result is PRESUMPTIVE POSTIVE SARS-CoV-2 nucleic acids MAY BE PRESENT.   A presumptive positive result was obtained on the submitted specimen  and confirmed on repeat testing.  While 2019 novel coronavirus  (SARS-CoV-2) nucleic acids may be present in the submitted sample  additional confirmatory testing may be necessary for epidemiological  and / or clinical management purposes  to differentiate between  SARS-CoV-2 and other Sarbecovirus currently known to infect humans.  If clinically indicated additional testing with an alternate test  methodology [id number]) is advised. The SARS-CoV-2 RNA is generally  detectable in upper and lower respiratory sp ecimens during the acute  phase of infection. The expected result is Negative. Fact Sheet for Patients:  StrictlyIdeas.no Fact Sheet for Healthcare Providers: BankingDealers.co.za This test is not yet approved or cleared by the [place] FDA and has been authorized for detection and/or diagnosis of SARS-CoV-2 by FDA under an Emergency Use Authorization (EUA).  This EUA will remain in effect (meaning this test can be used) for the duration of the COVID-19 declaration under Section 564(b)(1) of the Act, 21 U.S.C. section 360bbb-3(b)(1), unless the authorization is terminated or revoked sooner. Performed at [facility], [place] [address], [place], [place] [address]   Brain natriuretic peptide     Status: None   Collection Time: 03/01/19  6:22 PM  Result Value Ref Range   B Natriuretic Peptide 29.7 0.0 - 100.0 pg/mL    Comment: Performed at [facility] [address], [place], [place] [address]  Troponin I (High Sensitivity)     Status: Abnormal   Collection Time: 03/01/19  8:44 PM  Result Value Ref  Range   Troponin I (High Sensitivity) 43 (H) <18 ng/L     Comment: (NOTE) Elevated high sensitivity troponin I (hsTnI) values and significant  changes across serial measurements may suggest ACS but many other  chronic and acute conditions are known to elevate hsTnI results.  Refer to the "Links" section for chest pain algorithms and additional  guidance. Performed at [facility], [place] [address] [place], [place] [address]    Ct Angio Chest Pe W And/or Wo Contrast  Result Date: 03/01/2019 CLINICAL DATA:  Increasing shortness of breath and lightheadedness for the past week. EXAM: CT ANGIOGRAPHY CHEST WITH CONTRAST TECHNIQUE: Multidetector CT imaging of the chest was performed using the standard protocol during bolus administration of intravenous contrast. Multiplanar CT image reconstructions and MIPs were obtained to evaluate the vascular anatomy. CONTRAST:  32mL OMNIPAQUE IOHEXOL 300 MG/ML  SOLN COMPARISON:  Chest x-ray from same day. FINDINGS: Cardiovascular: Satisfactory opacification of the pulmonary arteries to the segmental level. Acute bilateral lower lobe and right upper lobe segmental and subsegmental pulmonary emboli. Normal heart size. RV/LV ratio of 1.5. No pericardial effusion. No thoracic aortic aneurysm or dissection. Mild atherosclerotic calcification of the aortic arch. Mediastinum/Nodes: No enlarged mediastinal, hilar, or axillary lymph nodes. Calcified right hilar lymph nodes. 8 mm hypodense nodule in the right thyroid lobe. The trachea and esophagus demonstrate no significant findings. Small hiatal hernia. Lungs/Pleura: Subsegmental atelectasis at the lung bases. No focal consolidation, pleural effusion, or pneumothorax. No suspicious pulmonary nodule. Upper Abdomen: No acute abnormality. Musculoskeletal: 1.2 cm nodule in the left breast. No acute or significant osseous findings. Elevation of the right breast. Review of the MIP images confirms the above findings. IMPRESSION: 1. Acute bilateral lower lobe and right upper lobe segmental  and subsegmental pulmonary emboli. 2. 1.2 cm left breast nodule.  Correlate with mammographic history. 3.  Aortic atherosclerosis (ICD10-I70.0). Critical Value/emergent results were called by telephone at the time of interpretation on 03/01/2019 at 9:07 pm to provider [facility], who verbally acknowledged these results. Electronically Signed   By: Titus Dubin M.D.   On: 03/01/2019 21:08   Dg Chest Port 1 View  Result Date: 03/01/2019 CLINICAL DATA:  68 year old female with dyspnea EXAM: PORTABLE CHEST 1 VIEW COMPARISON:  August 12, 2008 FINDINGS: Cardiomediastinal silhouette unchanged in size and contour. Interval worsening of asymmetric elevation of the right hemidiaphragm. No pneumothorax. No pleural effusion. No confluent airspace disease. Mild coarsening of interstitial markings. No displaced fracture IMPRESSION: Negative for acute cardiopulmonary disease. Interval worsening of asymmetric elevation of the right hemidiaphragm. Electronically Signed   By: Corrie Mckusick D.O.   On: 03/01/2019 18:49    Pending Labs Unresulted Labs (From admission, onward)    Start     Ordered   03/03/19 0500  Heparin level (unfractionated)  Daily,   R     03/01/19 2117   03/02/19 0500  CBC  Daily,   R     03/01/19 2117   03/02/19 0400  Heparin level (unfractionated)  Once-Timed,   STAT     03/01/19 2117   03/01/19 2220  Hemoglobin A1c  Once,   STAT    Comments: To assess prior glycemic control    03/01/19 2219   03/01/19 2218  HIV Antibody  (Routine Testing)  Once,   STAT     03/01/19 2219          Vitals/Pain Today's Vitals   03/01/19 1912 03/01/19 1945 03/01/19 2219 03/01/19 2230  BP: (!) 154/94 Marland Kitchen)  158/97  (!) 154/88  Pulse: (!) 110 (!) 121 (!) 109 (!) 119  Resp: 18 (!) 24 17 (!) 25  Temp:      TempSrc:      SpO2: 94% 90% 95% 95%  Weight:      Height:      PainSc:        Isolation Precautions No active isolations  Medications Medications  heparin ADULT infusion 100 units/mL (25000  units/266mL sodium chloride 0.45%) (1,000 Units/hr Intravenous New Bag/Given 03/01/19 2140)  atorvastatin (LIPITOR) tablet 80 mg (has no administration in time range)  colesevelam [facility]) tablet 1,875 mg (has no administration in time range)  Budesonide ER CP24 1 tablet (has no administration in time range)  Insulin Glargine (LANTUS) Solostar Pen 40 Units (has no administration in time range)  pantoprazole (PROTONIX) EC tablet 40 mg (has no administration in time range)  cholecalciferol (VITAMIN D) tablet 1,000 Units (has no administration in time range)  acetaminophen (TYLENOL) tablet 650 mg (has no administration in time range)    Or  acetaminophen (TYLENOL) suppository 650 mg (has no administration in time range)  insulin aspart (novoLOG) injection 0-9 Units (has no administration in time range)  gabapentin (NEURONTIN) capsule 600 mg (has no administration in time range)  gabapentin (NEURONTIN) capsule 300 mg (300 mg Oral Given 03/01/19 1936)  iohexol (OMNIPAQUE) 300 MG/ML solution 75 mL (75 mLs Intravenous Contrast Given 03/01/19 2028)  heparin bolus via infusion 3,500 Units (3,500 Units Intravenous Bolus from Bag 03/01/19 2141)    Mobility walks Low fall risk   Focused Assessments Pulmonary Assessment Handoff:  Lung sounds: Bilateral Breath Sounds: Clear L Breath Sounds: Clear R Breath Sounds: Clear O2 Device: Room Air        R Recommendations: See Admitting Provider Note  Report given to:   Additional Notes:

## 2019-03-01 NOTE — Progress Notes (Signed)
Debra Scala T. Zeah Germano, MD Primary Care and [place] at [facility] [place], [address] Phone: [phone]  FAX: [phone]  Debra Leach - 68 y.o. female  MRN [id number]  Date of Birth: May 30, 1951  Visit Date: 03/01/2019  PCP: [name], MD  Referred by: [name], MD  Chief Complaint  Patient presents with  . Hematuria   Subjective:   Debra Leach is a 68 y.o. very pleasant female patient who presents with the following:  She is here for hematuria.  Gross blood with wiping.  No issues with bladder in the past.   Smoked for abou 20 years at [age] off and on 13 /20 years.   Feels clammy and winded.  She has been having chest pain off and on, and she has had more dyspnea on exertion throughout the week, and she is feeling clammy and poorly as well as sweating in the office right now.  She is tachycardic to approximately 120.  Her blood pressure is 165/105.  She does not have any known history of coronary disease, but she does have aortic atherosclerosis, hyperlipidemia, diabetes history of aortic aneurysm in the family.  Grade 1 diastolic dysfunction on [id number] Echo. 09/2014, She had a myoview at [facility]. Grossly normal.  Tachycardic, bp increased Tachy to 120  ASA 162 mg in the office Oxygen 2 L  No arm or neck pain.  More SOB over the last week.  This is been progressive. Clammy and sweaty.  Cardiac history Father and patient ended up with pacemaker Younger brother had some aortic aneurysm Saw Phil Nahser about 4 years ago  She is also having gross hematuria today.  Past Medical History, Surgical History, Social History, Family History, Problem List, Medications, and Allergies have been reviewed and updated if relevant.  Patient Active Problem List   Diagnosis Date Noted  . Abnormal renal finding 06/29/2018  . 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 Eustachian tube  disorder, bilateral 12/16/2016  . Vertigo 12/16/2016  . Esophageal dysphagia 11/22/2016  . Chronic low back pain 10/07/2016  . Vision changes 08/12/2016  . Aortic atherosclerosis ([place]) 06/11/2016  . Supraclavicular fossa fullness 06/08/2016  . Family history of aortic aneurysm 08/20/2014  . Vitamin D deficiency 08/20/2014  . Diabetes mellitus with no complication ([place]) Q000111Q  . Osteopenia 11/06/2009  . Hyperlipidemia 07/05/2008  . INSOMNIA, CHRONIC 07/05/2008  . Migraine with aura 07/05/2008  . GERD 07/05/2008  . OSTEOARTHRITIS 07/05/2008  . MITRAL VALVE PROLAPSE, HX OF 07/05/2008  . NEPHROLITHIASIS, HX OF 07/05/2008    Past Medical History:  Diagnosis Date  . Arthritis    osteoarthritis B knees, left hip , right elbow  . Complication of anesthesia    difficult waking  . Diabetes mellitus without complication ([place])    Diet and exercise controlled  . GERD (gastroesophageal reflux disease)   . Headache   . History of kidney stones   . Hyperlipidemia   . MVP (mitral valve prolapse)    history of  . PONV (postoperative nausea and vomiting)     Past Surgical History:  Procedure Laterality Date  . arm surgery    . BREAST BIOPSY Left   . BREAST SURGERY  2000   breast biopsy (benign)  . LITHOTRIPSY  08-12-2008   stent placed bilaterally  . PARTIAL HYSTERECTOMY     Both ovaries remain, vaginal, for mennorhagia  . TONSILLECTOMY    . TUBAL LIGATION  1980    Social History   Socioeconomic History  . Marital status: Married    Spouse name: Carloyn Manner  . Number of children: 0  . Years of education: Not on file  . Highest education level: Not on file  Occupational History  . Occupation: retired Energy manager: retired  Scientific laboratory technician  . Financial resource strain: Not on file  . Food insecurity    Worry: Not on file    Inability: Not on file  . Transportation needs    Medical: Not on file    Non-medical: Not on file  Tobacco Use  . Smoking status: Former  Research scientist (life sciences)  . Smokeless tobacco: Never Used  Substance and Sexual Activity  . Alcohol use: No    Frequency: Never  . Drug use: No  . Sexual activity: Not on file  Lifestyle  . Physical activity    Days per week: Not on file    Minutes per session: Not on file  . Stress: Not on file  Relationships  . Social Herbalist on phone: Not on file    Gets together: Not on file    Attends religious service: Not on file    Active member of club or organization: Not on file    Attends meetings of clubs or organizations: Not on file    Relationship status: Not on file  . Intimate partner violence    Fear of current or ex partner: Not on file    Emotionally abused: Not on file    Physically abused: Not on file    Forced sexual activity: Not on file  Other Topics Concern  . Not on file  Social History Narrative   Regular exercise--yes, recumbent bike 3-4 days a week      Diet: fruits and veggies, water, eats at home, drinks a lot of milk      Patient is right-handed. She drinks 1-2 cups of coffee a day.    Family History  Problem Relation Age of Onset  . Cancer Mother        bone  . Hypertension Father   . Mitral valve prolapse Father   . Asthma Brother   . Arthritis Brother   . Nephrolithiasis Brother   . Nephrolithiasis Brother   . Asthma Brother   . Arthritis Brother   . Aortic aneurysm Brother        ascending aortic aneuysm  . Breast cancer Maternal Aunt   . Breast cancer Maternal Aunt     Allergies  Allergen Reactions  . Atenolol   . Codeine     REACTION: Migraine  . Penicillins Itching    Has patient had a PCN reaction causing immediate rash, facial/tongue/throat swelling, SOB or lightheadedness with hypotension: No Has patient had a PCN reaction causing severe rash involving mucus membranes or skin necrosis: No Has patient had a PCN reaction that required hospitalization: No Has patient had a PCN reaction occurring within the last 10 years: No If all of  the above answers are "NO", then may proceed with Cephalosporin use.   Marland Kitchen Pentazocine Lactate     REACTION: Swelling, itching, rash  . Crestor [Rosuvastatin] Other (See Comments)  . Sudafed [Pseudoephedrine Hcl] Itching and Anxiety    Medication list reviewed and updated in full in [place].   GEN: No acute illnesses, no fevers, chills. GI: No n/v/d, eating normally Pulm: + SOB worsening this week Gross hematuria Some chest pains, pressure -  known bad reflux? Sweaty, clammy Otherwise, the pertinent positives and negatives are listed above and in the HPI, otherwise a full review of systems has been reviewed and is negative unless noted positive.   Objective:   BP (!) 165/105   Pulse (!) 118   Temp 97.7 F (36.5 C) (Temporal)   Ht 5' 0.5" (1.537 m)   Wt 153 lb 8 oz (69.6 kg)   SpO2 97%   BMI 29.48 kg/m   GEN: WDWN, NAD, Non-toxic, A & O x 3, SWEATY HEENT: Atraumatic, Normocephalic. Neck supple. No masses, No LAD. Ears and Nose: No external deformity. CV: Tachycardic to 130, No M/G/R. No JVD. No thrill. No extra heart sounds. PULM: CTA B, no wheezes, crackles, rhonchi. No retractions. No resp. distress. No accessory muscle use. EXTR: No c/c/e NEURO Normal gait.  PSYCH: Normally interactive. Conversant. Not depressed or anxious appearing.  Calm demeanor.   Laboratory and Imaging Data:     [facility]                        [id number], [place] [address]                            [phone]  ------------------------------------------------------------------- Transthoracic Echocardiography  Patient:    Debra Leach, Debra Leach MR #:       [id number] Study Date: 09/27/2014 Gender:     F Age:        75 Height:     154.9 cm Weight:     66.7 kg BSA:        1.71 m^2 Pt. Status: Room:   REFERRING    Mertie Moores, M.D.  SONOGRAPHER  Victorio Palm, RDCS  ATTENDING    Ena Dawley, M.D.  PERFORMING   [facility], Outpatient   ORDERING     Nahser, Jr  REFERRING    Nahser, Jr  cc:  ------------------------------------------------------------------- LV EF: 65% -   70%  ------------------------------------------------------------------- Indications:      Chest pain (R07.9).  ------------------------------------------------------------------- History:   PMH:  History of MVP. Family history of aortic valve disease and aortic aneurysm. Acquired from the patient and from the patient&'s chart.  Chest pain. No prior cardiac history.  Risk factors:  Diabetes mellitus. Dyslipidemia.  ------------------------------------------------------------------- Study Conclusions  - Left ventricle: The cavity size was normal. Systolic function was   vigorous. The estimated ejection fraction was in the range of 65%   to 70%. Wall motion was normal; there were no regional wall   motion abnormalities. Doppler parameters are consistent with   abnormal left ventricular relaxation (grade 1 diastolic   dysfunction). There was no evidence of elevated ventricular   filling pressure by Doppler parameters. - Aortic valve: There was mild regurgitation. - Aortic root: The aortic root was normal in size. - Mitral valve: Mildly thickened leaflets . There was trivial   regurgitation. - Left atrium: The atrium was normal in size. - Right ventricle: Systolic function was normal. - Right atrium: The atrium was normal in size. - Tricuspid valve: There was trivial regurgitation. - Pulmonic valve: There was no regurgitation. - Pulmonary arteries: Systolic pressure was within the normal   range. - Inferior vena cava: The vessel was normal in size. - Pericardium, extracardiac: There was no pericardial  effusion.  Transthoracic echocardiography.  M-mode, complete 2D, spectral Doppler, and color Doppler.  Birthdate:  Patient birthdate: [birth year].  Age:  Patient is 68 yr old.  Sex:  Gender: female. BMI: 27.8 kg/m^2.  Blood pressure:      120/72  Patient status: Outpatient.  Study date:  Study date: 09/27/2014. Study time: 01:51 PM.  Location:  [facility]  -------------------------------------------------------------------  ------------------------------------------------------------------- Left ventricle:  The cavity size was normal. Systolic function was vigorous. The estimated ejection fraction was in the range of 65% to 70%. Wall motion was normal; there were no regional wall motion abnormalities. Doppler parameters are consistent with abnormal left ventricular relaxation (grade 1 diastolic dysfunction). There was no evidence of elevated ventricular filling pressure by Doppler parameters.  ------------------------------------------------------------------- Aortic valve:   Trileaflet; normal thickness leaflets. Mobility was not restricted.  Doppler:  Transvalvular velocity was within the normal range. There was no stenosis. There was mild regurgitation.   ------------------------------------------------------------------- Aorta:  Aortic root: The aortic root was normal in size.  ------------------------------------------------------------------- Mitral valve:   Mildly thickened leaflets . Leaflet separation was normal. Mobility was not restricted.  Doppler:  Transvalvular velocity was within the normal range. There was no evidence for stenosis. There was trivial regurgitation.  ------------------------------------------------------------------- Left atrium:  The atrium was normal in size.  ------------------------------------------------------------------- Right ventricle:  The cavity size was normal. Wall thickness was normal. Systolic function was normal.  ------------------------------------------------------------------- Pulmonic valve:    Structurally normal valve.   Cusp separation was normal.  Doppler:  Transvalvular velocity was within the normal range. There was no evidence for  stenosis. There was no regurgitation.  ------------------------------------------------------------------- Tricuspid valve:   Structurally normal valve.    Doppler: Transvalvular velocity was within the normal range. There was trivial regurgitation.  ------------------------------------------------------------------- Pulmonary artery:   The main pulmonary artery was normal-sized. Systolic pressure was within the normal range.  ------------------------------------------------------------------- Right atrium:  The atrium was normal in size.  ------------------------------------------------------------------- Pericardium:  There was no pericardial effusion.  ------------------------------------------------------------------- Systemic veins: Inferior vena cava: The vessel was normal in size.  ------------------------------------------------------------------- Measurements   Left ventricle                             Value        Reference  LV ID, ED, PLAX chordal          (L)       31.1  mm     43 - 52  LV ID, ES, PLAX chordal          (L)       18.1  mm     23 - 38  LV fx shortening, PLAX chordal             42    %      >=29  LV PW thickness, ED                        8.09  mm     ---------  IVS/LV PW ratio, ED                        0.85         <=1.3  Stroke volume, 2D                          49  ml     ---------  Stroke volume/bsa, 2D                      29    ml/m^2 ---------  LV e&', lateral                             9.54  cm/s   ---------  LV E/e&', lateral                           6.55         ---------  LV e&', medial                              9.43  cm/s   ---------  LV E/e&', medial                            6.63         ---------  LV e&', average                             9.49  cm/s   ---------  LV E/e&', average                           6.59         ---------    Ventricular septum                         Value        Reference  IVS thickness, ED                           6.91  mm     ---------    LVOT                                       Value        Reference  LVOT ID, S                                 19    mm     ---------  LVOT area                                  2.84  cm^2   ---------  LVOT ID                                    19    mm     ---------  LVOT peak velocity, S                      91.1  cm/s   ---------  LVOT mean velocity, S                      59.3  cm/s   ---------  LVOT VTI, S                                17.1  cm     ---------  Stroke volume (SV), LVOT DP                48.5  ml     ---------  Stroke index (SV/bsa), LVOT DP             28.3  ml/m^2 ---------    Aortic valve                               Value        Reference  Aortic regurg pressure half-time           651   ms     ---------    Aorta                                      Value        Reference  Aortic root ID, ED                         26    mm     ---------    Left atrium                                Value        Reference  LA ID, A-P, ES                             28    mm     ---------  LA ID/bsa, A-P                             1.63  cm/m^2 <=2.2  LA volume, S                               21    ml     ---------  LA volume/bsa, S                           12.2  ml/m^2 ---------  LA volume, ES, 1-p A4C                     21    ml     ---------  LA volume/bsa, ES, 1-p A4C                 12.2  ml/m^2 ---------  LA volume, ES, 1-p A2C                     21    ml     ---------  LA volume/bsa, ES, 1-p A2C                 12.2  ml/m^2 ---------    Mitral valve  Value        Reference  Mitral E-wave peak velocity                62.5  cm/s   ---------  Mitral A-wave peak velocity                79.8  cm/s   ---------  Mitral deceleration time                   158   ms     150 - 230  Mitral E/A ratio, peak                     0.8          ---------    Right ventricle                            Value         Reference  RV s&', lateral, S                          11.8  cm/s   ---------  Legend: (L)  and  (H)  mark values outside specified reference range.  ------------------------------------------------------------------- Prepared and Electronically Authenticated by  Ena Dawley, M.D. 2016-04-22T17:58:00  Images on [employer]  Show images for Adler, Kern [place]  Performing Technologist/Nurse  Performing Technologist/Nurse:    Reason for Exam Priority: Routine Dx: Chest pain, unspecified chest pain type [R07.9 (ICD-10-CM)]  Comments:      Surgical History    Assessment and Plan:     ICD-10-CM   1. Dyspnea on minimal exertion  R06.09   2. Hematuria, unspecified type  R31.9 POCT Urinalysis Dipstick (Automated)    Urine Culture  3. Tachycardia  R00.0 EKG 12-Lead    aspirin chewable tablet 162 mg  4. Hypertension associated with diabetes ([place])  E11.59    I10    >40 minutes spent in face to face time with patient, >50% spent in counselling or coordination of care   The patient does have dyspnea on exertion that is worsened over the last week.  At baseline she does not have this at all.  She is tachycardic to 130 and she has blood pressure 165/105.  She also has diabetes and a history of aortic atherosclerosis.  She does have multiple members in her family and had coronary disease.  She has had about a 20-pack-year smoking history.  She is sweating in the office today, and this started when she came into the office.  She feels clammy.  At this point I do not think that she is stable for outpatient management, and have activated EMS.  I gave the patient 162 mg of aspirin, and placed her on 2 L of oxygen  Follow-up: No follow-ups on file.  Meds ordered this encounter  Medications  . aspirin chewable tablet 162 mg   Orders Placed This Encounter  Procedures  . Urine Culture  . POCT Urinalysis Dipstick (Automated)  . EKG 12-Lead    Signed,  Jenella Craigie  T. Karman Biswell, MD   Outpatient Encounter Medications as of 03/01/2019  Medication Sig  . atorvastatin (LIPITOR) 80 MG tablet Take 1 tablet (80 mg total) by mouth daily.  . Budesonide ER 6 MG CP24 Take 1 tablet by mouth daily.  . cholecalciferol (VITAMIN D) 1000 units tablet Take 1,000 Units by mouth daily.  Marland Kitchen  clotrimazole-betamethasone (LOTRISONE) cream Apply topically 2 (two) times daily as needed.  . colesevelam (WELCHOL) 625 MG tablet Take 3 tablets (1,875 mg total) by mouth 2 (two) times daily with a meal.  . gabapentin (NEURONTIN) 100 MG capsule Take 1 capsule (100 mg total) by mouth 3 (three) times daily as needed. Up to three times a day  . gabapentin (NEURONTIN) 300 MG capsule Take 1 capsule (300 mg total) by mouth 2 (two) times daily.  . Galcanezumab-gnlm (EMGALITY) 120 MG/ML SOSY Inject 120 mg into the skin every 30 (thirty) days.  . Insulin Glargine (LANTUS SOLOSTAR) 100 UNIT/ML Solostar Pen Inject 40 Units into the skin daily.  . Insulin Pen Needle (ULTICARE SHORT PEN NEEDLES) 31G X 8 MM MISC USE TO INJECT INSULIN DAILY  . Lancets (ONETOUCH DELICA PLUS [id number]) MISC USE TO CHECK BLOOD SUGAR TWO TIMES DAILY  . omeprazole (PRILOSEC) 20 MG capsule Take 20 mg by mouth every morning.  . OnabotulinumtoxinA (BOTOX IJ) Inject 155 Units as directed.   Glory Rosebush VERIO test strip USE TO CHECK BLOOD SUGAR TWO TIMES A DAY  . promethazine (PHENERGAN) 12.5 MG tablet Take 12.5 mg by mouth every 6 (six) hours as needed for nausea or vomiting.  . Riboflavin (VITAMIN B2 PO) Take 400 mg by mouth daily.  Marland Kitchen Ubrogepant (UBRELVY) 100 MG TABS Take 1 tablet by mouth as needed (May repeat dose after 2 hours if needed.  Maximum 2 tablets in 24 hours).  . [DISCONTINUED] budesonide (ENTOCORT EC) 3 MG 24 hr capsule Take 9 mg by mouth daily.   . [DISCONTINUED] Insulin Glargine (LANTUS SOLOSTAR) 100 UNIT/ML Solostar Pen Inject 21 Units into the skin daily.  . [DISCONTINUED] SUMAtriptan (IMITREX) 6 MG/0.5ML SOLN  injection Inject 0.5 mLs (6 mg total) into the skin every 2 (two) hours as needed for migraine or headache. May repeat in 2 hours if headache persists or recurs.  . [EXPIRED] aspirin chewable tablet 162 mg    No facility-administered encounter medications on file as of 03/01/2019.

## 2019-03-02 ENCOUNTER — Observation Stay (HOSPITAL_BASED_OUTPATIENT_CLINIC_OR_DEPARTMENT_OTHER): Payer: Medicare Other

## 2019-03-02 ENCOUNTER — Encounter (HOSPITAL_COMMUNITY): Payer: Self-pay | Admitting: *Deleted

## 2019-03-02 ENCOUNTER — Observation Stay (HOSPITAL_COMMUNITY): Payer: Medicare Other

## 2019-03-02 ENCOUNTER — Other Ambulatory Visit: Payer: Self-pay

## 2019-03-02 DIAGNOSIS — I2602 Saddle embolus of pulmonary artery with acute cor pulmonale: Secondary | ICD-10-CM | POA: Diagnosis not present

## 2019-03-02 DIAGNOSIS — Z794 Long term (current) use of insulin: Secondary | ICD-10-CM | POA: Diagnosis not present

## 2019-03-02 DIAGNOSIS — I160 Hypertensive urgency: Secondary | ICD-10-CM | POA: Diagnosis present

## 2019-03-02 DIAGNOSIS — I2699 Other pulmonary embolism without acute cor pulmonale: Secondary | ICD-10-CM | POA: Diagnosis present

## 2019-03-02 DIAGNOSIS — I700 Atherosclerosis of aorta: Secondary | ICD-10-CM | POA: Diagnosis present

## 2019-03-02 DIAGNOSIS — K219 Gastro-esophageal reflux disease without esophagitis: Secondary | ICD-10-CM | POA: Diagnosis present

## 2019-03-02 DIAGNOSIS — Z87891 Personal history of nicotine dependence: Secondary | ICD-10-CM | POA: Diagnosis not present

## 2019-03-02 DIAGNOSIS — Z87442 Personal history of urinary calculi: Secondary | ICD-10-CM | POA: Diagnosis not present

## 2019-03-02 DIAGNOSIS — Z825 Family history of asthma and other chronic lower respiratory diseases: Secondary | ICD-10-CM | POA: Diagnosis not present

## 2019-03-02 DIAGNOSIS — Z79899 Other long term (current) drug therapy: Secondary | ICD-10-CM | POA: Diagnosis not present

## 2019-03-02 DIAGNOSIS — Z20828 Contact with and (suspected) exposure to other viral communicable diseases: Secondary | ICD-10-CM | POA: Diagnosis present

## 2019-03-02 DIAGNOSIS — E119 Type 2 diabetes mellitus without complications: Secondary | ICD-10-CM

## 2019-03-02 DIAGNOSIS — Z885 Allergy status to narcotic agent status: Secondary | ICD-10-CM | POA: Diagnosis not present

## 2019-03-02 DIAGNOSIS — D8989 Other specified disorders involving the immune mechanism, not elsewhere classified: Secondary | ICD-10-CM | POA: Diagnosis present

## 2019-03-02 DIAGNOSIS — I341 Nonrheumatic mitral (valve) prolapse: Secondary | ICD-10-CM | POA: Diagnosis present

## 2019-03-02 DIAGNOSIS — G43909 Migraine, unspecified, not intractable, without status migrainosus: Secondary | ICD-10-CM | POA: Diagnosis present

## 2019-03-02 DIAGNOSIS — I82401 Acute embolism and thrombosis of unspecified deep veins of right lower extremity: Secondary | ICD-10-CM | POA: Diagnosis present

## 2019-03-02 DIAGNOSIS — Z90711 Acquired absence of uterus with remaining cervical stump: Secondary | ICD-10-CM | POA: Diagnosis not present

## 2019-03-02 DIAGNOSIS — N6320 Unspecified lump in the left breast, unspecified quadrant: Secondary | ICD-10-CM | POA: Diagnosis present

## 2019-03-02 DIAGNOSIS — Z8261 Family history of arthritis: Secondary | ICD-10-CM | POA: Diagnosis not present

## 2019-03-02 DIAGNOSIS — Z880 Allergy status to penicillin: Secondary | ICD-10-CM | POA: Diagnosis not present

## 2019-03-02 DIAGNOSIS — R319 Hematuria, unspecified: Secondary | ICD-10-CM | POA: Diagnosis present

## 2019-03-02 DIAGNOSIS — Z888 Allergy status to other drugs, medicaments and biological substances status: Secondary | ICD-10-CM | POA: Diagnosis not present

## 2019-03-02 DIAGNOSIS — K861 Other chronic pancreatitis: Secondary | ICD-10-CM | POA: Diagnosis present

## 2019-03-02 DIAGNOSIS — E785 Hyperlipidemia, unspecified: Secondary | ICD-10-CM | POA: Diagnosis present

## 2019-03-02 DIAGNOSIS — I517 Cardiomegaly: Secondary | ICD-10-CM | POA: Diagnosis present

## 2019-03-02 DIAGNOSIS — N63 Unspecified lump in unspecified breast: Secondary | ICD-10-CM | POA: Diagnosis not present

## 2019-03-02 LAB — TROPONIN I (HIGH SENSITIVITY)
Troponin I (High Sensitivity): 48 ng/L — ABNORMAL HIGH (ref <18)
Troponin I (High Sensitivity): 38 ng/L — ABNORMAL HIGH (ref <18)

## 2019-03-02 LAB — HEPARIN LEVEL (UNFRACTIONATED)
Heparin Unfractionated: 1 IU/mL — ABNORMAL HIGH (ref 0.30–0.70)
Heparin Unfractionated: 0.56 IU/mL (ref 0.30–0.70)
Heparin Unfractionated: 0.64 IU/mL (ref 0.30–0.70)

## 2019-03-02 LAB — URINE CULTURE
MICRO NUMBER:: 918633
SPECIMEN QUALITY:: ADEQUATE

## 2019-03-02 LAB — GLUCOSE, CAPILLARY
Glucose-Capillary: 76 mg/dL (ref 70–99)
Glucose-Capillary: 104 mg/dL — ABNORMAL HIGH (ref 70–99)
Glucose-Capillary: 70 mg/dL (ref 70–99)
Glucose-Capillary: 171 mg/dL — ABNORMAL HIGH (ref 70–99)

## 2019-03-02 LAB — CBC
HCT: 41 % (ref 36.0–46.0)
Hemoglobin: 13.2 g/dL (ref 12.0–15.0)
MCH: 29.5 pg (ref 26.0–34.0)
MCHC: 32.2 g/dL (ref 30.0–36.0)
MCV: 91.5 fL (ref 80.0–100.0)
Platelets: 268 10*3/uL (ref 150–400)
RBC: 4.48 MIL/uL (ref 3.87–5.11)
RDW: 15.1 % (ref 11.5–15.5)
WBC: 10.7 10*3/uL — ABNORMAL HIGH (ref 4.0–10.5)
nRBC: 0 % (ref 0.0–0.2)

## 2019-03-02 LAB — ECHOCARDIOGRAM COMPLETE
Height: 61 in
Weight: 2384 oz

## 2019-03-02 LAB — HEMOGLOBIN A1C
Hgb A1c MFr Bld: 9.2 % — ABNORMAL HIGH (ref 4.8–5.6)
Mean Plasma Glucose: 217.34 mg/dL

## 2019-03-02 LAB — HIV ANTIBODY (ROUTINE TESTING W REFLEX): HIV Screen 4th Generation wRfx: NONREACTIVE

## 2019-03-02 MED ORDER — BUDESONIDE 3 MG PO CPEP
6.0000 mg | ORAL_CAPSULE | Freq: Every day | ORAL | Status: DC
  Administered 2019-03-02 – 2019-03-03 (×2): 6 mg via ORAL
  Filled 2019-03-02 (×2): qty 2

## 2019-03-02 MED ORDER — GABAPENTIN 300 MG PO CAPS
300.0000 mg | ORAL_CAPSULE | Freq: Two times a day (BID) | ORAL | Status: DC | PRN
  Administered 2019-03-02 – 2019-03-03 (×2): 300 mg via ORAL
  Filled 2019-03-02 (×2): qty 1

## 2019-03-02 MED ORDER — KETOROLAC TROMETHAMINE 15 MG/ML IJ SOLN
15.0000 mg | Freq: Once | INTRAMUSCULAR | Status: DC
  Filled 2019-03-02: qty 1

## 2019-03-02 MED ORDER — SUMATRIPTAN SUCCINATE 50 MG PO TABS
50.0000 mg | ORAL_TABLET | ORAL | Status: DC | PRN
  Administered 2019-03-02: 50 mg via ORAL
  Filled 2019-03-02 (×2): qty 1

## 2019-03-02 MED ORDER — SUMATRIPTAN SUCCINATE 100 MG PO TABS
100.0000 mg | ORAL_TABLET | Freq: Once | ORAL | Status: AC
  Administered 2019-03-02: 100 mg via ORAL
  Filled 2019-03-02: qty 1

## 2019-03-02 MED ORDER — HYDRALAZINE HCL 25 MG PO TABS: 25.0000 mg | ORAL_TABLET | Freq: Four times a day (QID) | ORAL | Status: DC | PRN

## 2019-03-02 MED ORDER — DIPHENHYDRAMINE HCL 50 MG/ML IJ SOLN
25.0000 mg | Freq: Once | INTRAMUSCULAR | Status: DC
  Filled 2019-03-02: qty 1

## 2019-03-02 MED ORDER — METOCLOPRAMIDE HCL 5 MG/ML IJ SOLN
10.0000 mg | Freq: Once | INTRAMUSCULAR | Status: DC
  Filled 2019-03-02: qty 2

## 2019-03-02 MED ORDER — INSULIN GLARGINE 100 UNIT/ML ~~LOC~~ SOLN
40.0000 [IU] | Freq: Every day | SUBCUTANEOUS | Status: DC
  Administered 2019-03-02: 40 [IU] via SUBCUTANEOUS
  Filled 2019-03-02 (×2): qty 0.4

## 2019-03-02 MED ORDER — PROMETHAZINE HCL 25 MG/ML IJ SOLN: 12.5000 mg | Freq: Four times a day (QID) | INTRAMUSCULAR | Status: DC | PRN

## 2019-03-02 NOTE — TOC Benefit Eligibility Note (Signed)
Transition of Care [facility]) Benefit Eligibility Note    Patient Details  Name: Debra Leach MRN: [id number] Date of Birth: August 28, 1950   Medication/Dose: Eliquis 5mg  BID  Covered?: Yes   Prescription Coverage Preferred Pharmacy: [facility]  Spoke with Person/Company/Phone Number:: [employer] ID:[id number]  Co-Pay: 90 day mail order: $70  // 30 day retail $35  Prior Approval: No   Adrian Phone Number: 03/02/2019, 11:16 AM

## 2019-03-02 NOTE — Progress Notes (Signed)
Echocardiogram 2D Echocardiogram has been performed.  Debra Leach Makar Slatter 03/02/2019, 11:10 AM

## 2019-03-02 NOTE — Progress Notes (Signed)
[place] for Heparin Indication: pulmonary embolus  Allergies  Allergen Reactions  . Atenolol   . Codeine     REACTION: Migraine  . Penicillins Itching    Has patient had a PCN reaction causing immediate rash, facial/tongue/throat swelling, SOB or lightheadedness with hypotension: No Has patient had a PCN reaction causing severe rash involving mucus membranes or skin necrosis: No Has patient had a PCN reaction that required hospitalization: No Has patient had a PCN reaction occurring within the last 10 years: No If all of the above answers are "NO", then may proceed with Cephalosporin use.   Marland Kitchen Pentazocine Lactate     REACTION: Swelling, itching, rash  . Crestor [Rosuvastatin] Other (See Comments)  . Sudafed [Pseudoephedrine Hcl] Itching and Anxiety    Patient Measurements: Height: 5\' 1"  (154.9 cm) Weight: 149 lb (67.6 kg) IBW/kg (Calculated) : 47.8 Heparin Dosing Weight: 62.1kg  Vital Signs: Temp: 98.9 F (37.2 C) (09/25 1359) Temp Source: Oral (09/25 1359) BP: 152/87 (09/25 1359) Pulse Rate: 109 (09/25 1359)  Labs: Recent Labs    03/01/19 1822 03/01/19 2044 03/02/19 0325 03/02/19 0802 03/02/19 1137  HGB 14.2  --  13.2  --   --   HCT 43.4  --  41.0  --   --   PLT 252  --  268  --   --   HEPARINUNFRC  --   --  1.00*  --  0.56  CREATININE 1.15*  --   --   --   --   TROPONINIHS 39* 43* 48* 38*  --     Estimated Creatinine Clearance: 41.7 mL/min (A) (by C-G formula based on SCr of 1.15 mg/dL (H)).   Medical History: Past Medical History:  Diagnosis Date  . Arthritis    osteoarthritis B knees, left hip , right elbow  . Complication of anesthesia    difficult waking  . Diabetes mellitus without complication ([place])    Diet and exercise controlled  . GERD (gastroesophageal reflux disease)   . Headache   . History of kidney stones   . Hyperlipidemia   . MVP (mitral valve prolapse)    history of  . PONV (postoperative  nausea and vomiting)     Medications:  Infusions:  . heparin 800 Units/hr (03/02/19 0444)    Assessment: 87 yof presented to the ED with hematuria and SOB. Found to have a bilateral PE and RLE DVT on IV heparin. She is not on anticoagulation PTA.  -heparin level is at goal, CBC stable   Goal of Therapy:  Heparin level 0.3-0.7 units/ml Monitor platelets by anticoagulation protocol: Yes   Plan:  -No heparin changes needed -Heparin level in 6 hours and daily wth CBC daily  Hildred Laser, PharmD Clinical Pharmacist **Pharmacist phone directory can now be found on [email or web address] ([email or web address]).  Listed under [place].

## 2019-03-02 NOTE — Care Management (Signed)
1028 03-02-19 CM received referral for Eliquis cost. Benefits check submitted for Eliquis and CM will make patient aware of cost. Adelfa Koh Ocie Cornfield, RN,BSN Case Manager [phone]

## 2019-03-02 NOTE — Progress Notes (Signed)
Dr. Karleen Hampshire made aware.  She adjusted her meds.

## 2019-03-02 NOTE — Progress Notes (Signed)
Bilateral lower extremity venous duplex completed. Preliminary results in Chart review CV Proc. [place] Messiah Ahr,RVS 03/02/2019, 10:41 AM

## 2019-03-02 NOTE — Progress Notes (Signed)
[place] for Heparin Indication: pulmonary embolus  Allergies  Allergen Reactions  . Atenolol   . Codeine     REACTION: Migraine  . Penicillins Itching    Has patient had a PCN reaction causing immediate rash, facial/tongue/throat swelling, SOB or lightheadedness with hypotension: No Has patient had a PCN reaction causing severe rash involving mucus membranes or skin necrosis: No Has patient had a PCN reaction that required hospitalization: No Has patient had a PCN reaction occurring within the last 10 years: No If all of the above answers are "NO", then may proceed with Cephalosporin use.   Marland Kitchen Pentazocine Lactate     REACTION: Swelling, itching, rash  . Crestor [Rosuvastatin] Other (See Comments)  . Sudafed [Pseudoephedrine Hcl] Itching and Anxiety    Patient Measurements: Height: 5\' 1"  (154.9 cm) Weight: 149 lb (67.6 kg) IBW/kg (Calculated) : 47.8 Heparin Dosing Weight: 62.1kg  Vital Signs: Temp: 98.9 F (37.2 C) (09/25 1359) Temp Source: Oral (09/25 1359) BP: 152/87 (09/25 1359) Pulse Rate: 109 (09/25 1359)  Labs: Recent Labs    03/01/19 1822 03/01/19 2044 03/02/19 0325 03/02/19 0802 03/02/19 1137 03/02/19 1751  HGB 14.2  --  13.2  --   --   --   HCT 43.4  --  41.0  --   --   --   PLT 252  --  268  --   --   --   HEPARINUNFRC  --   --  1.00*  --  0.56 0.64  CREATININE 1.15*  --   --   --   --   --   TROPONINIHS 39* 43* 48* 38*  --   --     Estimated Creatinine Clearance: 41.7 mL/min (A) (by C-G formula based on SCr of 1.15 mg/dL (H)).   Medical History: Past Medical History:  Diagnosis Date  . Arthritis    osteoarthritis B knees, left hip , right elbow  . Complication of anesthesia    difficult waking  . Diabetes mellitus without complication ([place])    Diet and exercise controlled  . GERD (gastroesophageal reflux disease)   . Headache   . History of kidney stones   . Hyperlipidemia   . MVP (mitral valve  prolapse)    history of  . PONV (postoperative nausea and vomiting)     Medications:  Infusions:  . heparin 800 Units/hr (03/02/19 0444)    Assessment: 80 yof presented to the ED with hematuria and SOB. Found to have a bilateral PE and RLE DVT on IV heparin. She is not on anticoagulation PTA.  -heparin level continues to be at goal. No changes warranted.    Goal of Therapy:  Heparin level 0.3-0.7 units/ml Monitor platelets by anticoagulation protocol: Yes   Plan:  -No heparin changes needed -Daily Heparin level   Erin Hearing PharmD., BCPS Clinical Pharmacist 03/02/2019 7:20 PM

## 2019-03-02 NOTE — Progress Notes (Signed)
[place] for Heparin Indication: pulmonary embolus  Allergies  Allergen Reactions  . Atenolol   . Codeine     REACTION: Migraine  . Penicillins Itching    Has patient had a PCN reaction causing immediate rash, facial/tongue/throat swelling, SOB or lightheadedness with hypotension: No Has patient had a PCN reaction causing severe rash involving mucus membranes or skin necrosis: No Has patient had a PCN reaction that required hospitalization: No Has patient had a PCN reaction occurring within the last 10 years: No If all of the above answers are "NO", then may proceed with Cephalosporin use.   Marland Kitchen Pentazocine Lactate     REACTION: Swelling, itching, rash  . Crestor [Rosuvastatin] Other (See Comments)  . Sudafed [Pseudoephedrine Hcl] Itching and Anxiety    Patient Measurements: Height: 5\' 1"  (154.9 cm) Weight: 149 lb (67.6 kg) IBW/kg (Calculated) : 47.8 Heparin Dosing Weight: 62.1kg  Vital Signs: Temp: 98.6 F (37 C) (09/25 0421) Temp Source: Oral (09/25 0421) BP: 151/78 (09/25 0421) Pulse Rate: 94 (09/25 0421)  Labs: Recent Labs    03/01/19 1822 03/01/19 2044 03/02/19 0325  HGB 14.2  --  13.2  HCT 43.4  --  41.0  PLT 252  --  268  HEPARINUNFRC  --   --  1.00*  CREATININE 1.15*  --   --   TROPONINIHS 39* 43* 48*    Estimated Creatinine Clearance: 41.7 mL/min (A) (by C-G formula based on SCr of 1.15 mg/dL (H)).   Medical History: Past Medical History:  Diagnosis Date  . Arthritis    osteoarthritis B knees, left hip , right elbow  . Complication of anesthesia    difficult waking  . Diabetes mellitus without complication ([place])    Diet and exercise controlled  . GERD (gastroesophageal reflux disease)   . Headache   . History of kidney stones   . Hyperlipidemia   . MVP (mitral valve prolapse)    history of  . PONV (postoperative nausea and vomiting)     Medications:  Infusions:  . heparin 1,000 Units/hr (03/01/19  2140)    Assessment: 50 yof presented to the ED with hematuria and SOB. Found to have a bilateral PE. To start IV heparin. Baseline CBC is WNL and she is not on anticoagulation PTA.   9/25 AM update: Initial heparin level elevated, no issues per RN  Goal of Therapy:  Heparin level 0.3-0.7 units/ml Monitor platelets by anticoagulation protocol: Yes   Plan:  Dec heparin to 800 units/hr Re-check heparin level at [place], PharmD, [place] Pharmacist Phone: [phone]

## 2019-03-02 NOTE — Progress Notes (Signed)
PROGRESS NOTE    Debra Leach  [id number] DOB: 07/07/1950 DOA: 03/01/2019 PCP: [name], MD    Brief Narrative:  68 year old lady with prior h/o of hyperlipidemia, DM, MVP, GERD presents to ED with dyspnea,  Assessment & Plan:   Principal Problem:   Acute pulmonary embolism ([place]) Active Problems:   Hyperlipidemia   Diabetes mellitus with no complication (H[facility])   Leukocytosis   Breast nodule   Acute pulmonary embolism and right lower extremity DVT:  Continue with IV heparin for anticoagulation till tomorrow. Pt still very dyspneic on exertion with palpitations.  echocardiogram reviewed with the patient.  Monitor overnight on telemetry.  Plan to transition to oral eliquis on discharge , when her symptoms improve.  [place] oxygen to keep sats greater than 90%.    Hematuria:  Appears to have resolved.    Hypertensive urgency:  Probably sec to migraine headache.     Migraine: Prn imitrex and phenergan ordered.    Diabetes mellitus:  CBG (last 3)  Recent Labs    03/02/19 0742 03/02/19 1201 03/02/19 1611  GLUCAP 76 104* 70   Resume SSI.  cbgs' well controlled.       DVT prophylaxis: heparin.  Code Status: (full code.  Family Communication: family at bedside.  Disposition Plan: possible d.c in am if symptoms improve.    Consultants:   None.   Procedures: echocardiogram.  Antimicrobials: none.   Subjective:  She  reports severe headache, and dyspnea, and palpitations.   Objective: Vitals:   03/02/19 0745 03/02/19 0807 03/02/19 1130 03/02/19 1359  BP: (!) 176/103 (!) 163/85 (!) 157/76 (!) 152/87  Pulse:  (!) 107 (!) 107 (!) 109  Resp:      Temp: 98.4 F (36.9 C)   98.9 F (37.2 C)  TempSrc: Oral   Oral  SpO2:  94% 96% 94%  Weight:      Height:        Intake/Output Summary (Last 24 hours) at 03/02/2019 1909 Last data filed at 03/02/2019 1500 Gross per 24 hour  Intake 647.33 ml  Output 900 ml  Net -252.67 ml   Filed  Weights   03/01/19 1746  Weight: 67.6 kg    Examination:  General exam: in mod distress from sob and headache,  Respiratory system: tachypnea, air entry fair, no wheezing or rhonchi.  Cardiovascular system: S1 & S2 heard, tachycardia, REGULAR.  Gastrointestinal system: Abdomen is nondistended, soft and nontender. No organomegaly or masses felt. Normal bowel sounds heard. Central nervous system: Alert and oriented. No focal neurological deficits. Extremities: Symmetric 5 x 5 power. Skin: No rashes, lesions or ulcers Psychiatry: Mood & affect appropriate.     Data Reviewed: I have personally reviewed following labs and imaging studies  CBC: Recent Labs  Lab 03/01/19 1822 03/02/19 0325  WBC 11.3* 10.7*  NEUTROABS 9.1*  --   HGB 14.2 13.2  HCT 43.4 41.0  MCV 92.3 91.5  PLT 252 [id number]   Basic Metabolic Panel: Recent Labs  Lab 03/01/19 1822  NA 144  K 4.0  CL 108  CO2 24  GLUCOSE 131*  BUN 16  CREATININE 1.15*  CALCIUM 9.2   GFR: Estimated Creatinine Clearance: 41.7 mL/min (A) (by C-G formula based on SCr of 1.15 mg/dL (H)). Liver Function Tests: Recent Labs  Lab 03/01/19 1822  AST 22  ALT 32  ALKPHOS 71  BILITOT 0.5  PROT 6.7  ALBUMIN 3.2*   No results for input(s): LIPASE, AMYLASE in the  last 168 hours. No results for input(s): AMMONIA in the last 168 hours. Coagulation Profile: No results for input(s): INR, PROTIME in the last 168 hours. Cardiac Enzymes: No results for input(s): CKTOTAL, CKMB, CKMBINDEX, TROPONINI in the last 168 hours. BNP (last 3 results) No results for input(s): PROBNP in the last 8760 hours. HbA1C: Recent Labs    03/02/19 0325  HGBA1C 9.2*   CBG: Recent Labs  Lab 03/02/19 0742 03/02/19 1201 03/02/19 1611  GLUCAP 76 104* 70   Lipid Profile: No results for input(s): CHOL, HDL, LDLCALC, TRIG, CHOLHDL, LDLDIRECT in the last 72 hours. Thyroid Function Tests: No results for input(s): TSH, T4TOTAL, FREET4, T3FREE, THYROIDAB in  the last 72 hours. Anemia Panel: No results for input(s): VITAMINB12, FOLATE, FERRITIN, TIBC, IRON, RETICCTPCT in the last 72 hours. Sepsis Labs: No results for input(s): PROCALCITON, LATICACIDVEN in the last 168 hours.  Recent Results (from the past 240 hour(s))  SARS Coronavirus 2 [facility] order, Performed in [facility] hospital lab) Nasopharyngeal Nasopharyngeal Swab     Status: None   Collection Time: 03/01/19  6:22 PM   Specimen: Nasopharyngeal Swab  Result Value Ref Range Status   SARS Coronavirus 2 NEGATIVE NEGATIVE Final    Comment: (NOTE) If result is NEGATIVE SARS-CoV-2 target nucleic acids are NOT DETECTED. The SARS-CoV-2 RNA is generally detectable in upper and lower  respiratory specimens during the acute phase of infection. The lowest  concentration of SARS-CoV-2 viral copies this assay can detect is 250  copies / mL. A negative result does not preclude SARS-CoV-2 infection  and should not be used as the sole basis for treatment or other  patient management decisions.  A negative result may occur with  improper specimen collection / handling, submission of specimen other  than nasopharyngeal swab, presence of viral mutation(s) within the  areas targeted by this assay, and inadequate number of viral copies  (<250 copies / mL). A negative result must be combined with clinical  observations, patient history, and epidemiological information. If result is POSITIVE SARS-CoV-2 target nucleic acids are DETECTED. The SARS-CoV-2 RNA is generally detectable in upper and lower  respiratory specimens dur ing the acute phase of infection.  Positive  results are indicative of active infection with SARS-CoV-2.  Clinical  correlation with patient history and other diagnostic information is  necessary to determine patient infection status.  Positive results do  not rule out bacterial infection or co-infection with other viruses. If result is PRESUMPTIVE POSTIVE SARS-CoV-2 nucleic  acids MAY BE PRESENT.   A presumptive positive result was obtained on the submitted specimen  and confirmed on repeat testing.  While 2019 novel coronavirus  (SARS-CoV-2) nucleic acids may be present in the submitted sample  additional confirmatory testing may be necessary for epidemiological  and / or clinical management purposes  to differentiate between  SARS-CoV-2 and other Sarbecovirus currently known to infect humans.  If clinically indicated additional testing with an alternate test  methodology [id number]) is advised. The SARS-CoV-2 RNA is generally  detectable in upper and lower respiratory sp ecimens during the acute  phase of infection. The expected result is Negative. Fact Sheet for Patients:  StrictlyIdeas.no Fact Sheet for Healthcare Providers: BankingDealers.co.za This test is not yet approved or cleared by the [place] FDA and has been authorized for detection and/or diagnosis of SARS-CoV-2 by FDA under an Emergency Use Authorization (EUA).  This EUA will remain in effect (meaning this test can be used) for the duration of the COVID-19 declaration  under Section 564(b)(1) of the Act, 21 U.S.C. section 360bbb-3(b)(1), unless the authorization is terminated or revoked sooner. Performed at [facility], [place] [address], [place],  [address]          Radiology Studies: Ct Angio Chest Pe W And/or Wo Contrast  Result Date: 03/01/2019 CLINICAL DATA:  Increasing shortness of breath and lightheadedness for the past week. EXAM: CT ANGIOGRAPHY CHEST WITH CONTRAST TECHNIQUE: Multidetector CT imaging of the chest was performed using the standard protocol during bolus administration of intravenous contrast. Multiplanar CT image reconstructions and MIPs were obtained to evaluate the vascular anatomy. CONTRAST:  71mL OMNIPAQUE IOHEXOL 300 MG/ML  SOLN COMPARISON:  Chest x-ray from same day. FINDINGS: Cardiovascular:  Satisfactory opacification of the pulmonary arteries to the segmental level. Acute bilateral lower lobe and right upper lobe segmental and subsegmental pulmonary emboli. Normal heart size. RV/LV ratio of 1.5. No pericardial effusion. No thoracic aortic aneurysm or dissection. Mild atherosclerotic calcification of the aortic arch. Mediastinum/Nodes: No enlarged mediastinal, hilar, or axillary lymph nodes. Calcified right hilar lymph nodes. 8 mm hypodense nodule in the right thyroid lobe. The trachea and esophagus demonstrate no significant findings. Small hiatal hernia. Lungs/Pleura: Subsegmental atelectasis at the lung bases. No focal consolidation, pleural effusion, or pneumothorax. No suspicious pulmonary nodule. Upper Abdomen: No acute abnormality. Musculoskeletal: 1.2 cm nodule in the left breast. No acute or significant osseous findings. Elevation of the right breast. Review of the MIP images confirms the above findings. IMPRESSION: 1. Acute bilateral lower lobe and right upper lobe segmental and subsegmental pulmonary emboli. 2. 1.2 cm left breast nodule.  Correlate with mammographic history. 3.  Aortic atherosclerosis (ICD10-I70.0). Critical Value/emergent results were called by telephone at the time of interpretation on 03/01/2019 at 9:07 pm to provider [facility], who verbally acknowledged these results. Electronically Signed   By: Titus Dubin M.D.   On: 03/01/2019 21:08   Dg Chest Port 1 View  Result Date: 03/01/2019 CLINICAL DATA:  68 year old female with dyspnea EXAM: PORTABLE CHEST 1 VIEW COMPARISON:  August 12, 2008 FINDINGS: Cardiomediastinal silhouette unchanged in size and contour. Interval worsening of asymmetric elevation of the right hemidiaphragm. No pneumothorax. No pleural effusion. No confluent airspace disease. Mild coarsening of interstitial markings. No displaced fracture IMPRESSION: Negative for acute cardiopulmonary disease. Interval worsening of asymmetric elevation of the right  hemidiaphragm. Electronically Signed   By: Corrie Mckusick D.O.   On: 03/01/2019 18:49   Vas [place] Lower Extremity Venous (dvt)  Result Date: 03/02/2019  Lower Venous Study Indications: Pulmonary embolism.  Risk Factors: Confirmed PE. Comparison Study: Np previous study available Performing Technologist: [employer] [facility]  Examination Guidelines: A complete evaluation includes B-mode imaging, spectral Doppler, color Doppler, and power Doppler as needed of all accessible portions of each vessel. Bilateral testing is considered an integral part of a complete examination. Limited examinations for reoccurring indications may be performed as noted.  +---------+---------------+---------+-----------+----------+------------------+  RIGHT     Compressibility Phasicity Spontaneity Properties Thrombus Aging      +---------+---------------+---------+-----------+----------+------------------+  CFV       Full            Yes       Yes                                        +---------+---------------+---------+-----------+----------+------------------+  SFJ       Full                                                                 +---------+---------------+---------+-----------+----------+------------------+  FV Prox   Full            Yes       Yes                                        +---------+---------------+---------+-----------+----------+------------------+  FV Mid    Full                                                                 +---------+---------------+---------+-----------+----------+------------------+  FV Distal Full            Yes       Yes                                        +---------+---------------+---------+-----------+----------+------------------+  PFV       Full            Yes       Yes                                        +---------+---------------+---------+-----------+----------+------------------+  POP       Partial         Yes       Yes                    Acute distal                                                                     region              +---------+---------------+---------+-----------+----------+------------------+  PTV       Partial                                          Acute mid to                                                                    proximal            +---------+---------------+---------+-----------+----------+------------------+  PERO      Partial                                          Acute mid to  proximal            +---------+---------------+---------+-----------+----------+------------------+   +---------+---------------+---------+-----------+----------+--------------+  LEFT      Compressibility Phasicity Spontaneity Properties Thrombus Aging  +---------+---------------+---------+-----------+----------+--------------+  CFV       Full            Yes       Yes                                    +---------+---------------+---------+-----------+----------+--------------+  SFJ       Full                                                             +---------+---------------+---------+-----------+----------+--------------+  FV Prox   Full            Yes       Yes                                    +---------+---------------+---------+-----------+----------+--------------+  FV Mid    Full                                                             +---------+---------------+---------+-----------+----------+--------------+  FV Distal Full            Yes       Yes                                    +---------+---------------+---------+-----------+----------+--------------+  PFV       Full            Yes       Yes                                    +---------+---------------+---------+-----------+----------+--------------+  POP       Full            Yes       Yes                                    +---------+---------------+---------+-----------+----------+--------------+  PTV       Full                                                              +---------+---------------+---------+-----------+----------+--------------+  PERO      Full                                                             +---------+---------------+---------+-----------+----------+--------------+  Summary: Right: Findings consistent with acute deep vein thrombosis involving the right popliteal vein, right posterior tibial veins, and right peroneal veins. No cystic structure found in the popliteal fossa. Left: There is no evidence of deep vein thrombosis in the lower extremity. No cystic structure found in the popliteal fossa.  *See table(s) above for measurements and observations.    Preliminary         Scheduled Meds:  atorvastatin  80 mg Oral Daily   budesonide  6 mg Oral Daily   cholecalciferol  1,000 Units Oral Daily   colesevelam  1,875 mg Oral BID WC   gabapentin  600 mg Oral QHS   insulin aspart  0-9 Units Subcutaneous TID WC   insulin glargine  40 Units Subcutaneous Daily   pantoprazole  40 mg Oral Daily   Continuous Infusions:  heparin 800 Units/hr (03/02/19 0444)     LOS: 0 days        Hosie Poisson, MD [facility] Pager [phone]  If 7PM-7AM, please contact night-coverage [email or web address] Password [facility] 03/02/2019, 7:09 PM

## 2019-03-03 DIAGNOSIS — N63 Unspecified lump in unspecified breast: Secondary | ICD-10-CM

## 2019-03-03 LAB — CBC
HCT: 40.4 % (ref 36.0–46.0)
Hemoglobin: 13.7 g/dL (ref 12.0–15.0)
MCH: 31.1 pg (ref 26.0–34.0)
MCHC: 33.9 g/dL (ref 30.0–36.0)
MCV: 91.6 fL (ref 80.0–100.0)
Platelets: 267 10*3/uL (ref 150–400)
RBC: 4.41 MIL/uL (ref 3.87–5.11)
RDW: 15.6 % — ABNORMAL HIGH (ref 11.5–15.5)
WBC: 9.2 10*3/uL (ref 4.0–10.5)
nRBC: 0 % (ref 0.0–0.2)

## 2019-03-03 LAB — HEPARIN LEVEL (UNFRACTIONATED): Heparin Unfractionated: 0.58 IU/mL (ref 0.30–0.70)

## 2019-03-03 LAB — BASIC METABOLIC PANEL
Anion gap: 8 (ref 5–15)
BUN: 12 mg/dL (ref 8–23)
CO2: 28 mmol/L (ref 22–32)
Calcium: 8.9 mg/dL (ref 8.9–10.3)
Chloride: 111 mmol/L (ref 98–111)
Creatinine, Ser: 1.07 mg/dL — ABNORMAL HIGH (ref 0.44–1.00)
GFR calc Af Amer: >60 mL/min (ref >60)
GFR calc non Af Amer: 54 mL/min — ABNORMAL LOW (ref >60)
Glucose, Bld: 69 mg/dL — ABNORMAL LOW (ref 70–99)
Potassium: 3.8 mmol/L (ref 3.5–5.1)
Sodium: 147 mmol/L — ABNORMAL HIGH (ref 135–145)

## 2019-03-03 LAB — GLUCOSE, CAPILLARY
Glucose-Capillary: 76 mg/dL (ref 70–99)
Glucose-Capillary: 102 mg/dL — ABNORMAL HIGH (ref 70–99)
Glucose-Capillary: 117 mg/dL — ABNORMAL HIGH (ref 70–99)

## 2019-03-03 MED ORDER — APIXABAN 5 MG PO TABS: 5.0000 mg | ORAL_TABLET | Freq: Two times a day (BID) | ORAL | Status: DC

## 2019-03-03 MED ORDER — APIXABAN 5 MG PO TABS: 10.0000 mg | ORAL_TABLET | Freq: Two times a day (BID) | ORAL | 0 refills | Status: DC

## 2019-03-03 MED ORDER — INSULIN GLARGINE 100 UNIT/ML ~~LOC~~ SOLN
20.0000 [IU] | Freq: Every day | SUBCUTANEOUS | Status: DC
  Administered 2019-03-03: 20 [IU] via SUBCUTANEOUS
  Filled 2019-03-03: qty 0.2

## 2019-03-03 MED ORDER — APIXABAN 5 MG PO TABS
10.0000 mg | ORAL_TABLET | Freq: Two times a day (BID) | ORAL | Status: DC
  Administered 2019-03-03: 10 mg via ORAL
  Filled 2019-03-03: qty 2

## 2019-03-03 MED ORDER — APIXABAN 5 MG PO TABS: 5.0000 mg | ORAL_TABLET | Freq: Two times a day (BID) | ORAL | 1 refills | Status: DC

## 2019-03-03 MED ORDER — INSULIN GLARGINE 100 UNIT/ML ~~LOC~~ SOLN: 20.0000 [IU] | Freq: Every day | SUBCUTANEOUS | 11 refills | Status: DC

## 2019-03-03 NOTE — Progress Notes (Signed)
[place] for apixaban  Indication: pulmonary embolus  Allergies  Allergen Reactions  . Atenolol   . Codeine     REACTION: Migraine  . Penicillins Itching    Has patient had a PCN reaction causing immediate rash, facial/tongue/throat swelling, SOB or lightheadedness with hypotension: No Has patient had a PCN reaction causing severe rash involving mucus membranes or skin necrosis: No Has patient had a PCN reaction that required hospitalization: No Has patient had a PCN reaction occurring within the last 10 years: No If all of the above answers are "NO", then may proceed with Cephalosporin use.   Marland Kitchen Pentazocine Lactate     REACTION: Swelling, itching, rash  . Crestor [Rosuvastatin] Other (See Comments)  . Sudafed [Pseudoephedrine Hcl] Itching and Anxiety    Patient Measurements: Height: 5\' 1"  (154.9 cm) Weight: 150 lb 6.4 oz (68.2 kg) IBW/kg (Calculated) : 47.8 Heparin Dosing Weight: 62.1kg  Vital Signs: Temp: 98.9 F (37.2 C) (09/26 0614) Temp Source: Oral (09/26 0614) BP: 151/77 (09/26 [id number]) Pulse Rate: 86 (09/26 0614)  Labs: Recent Labs    03/01/19 1822 03/01/19 2044  03/02/19 0325 03/02/19 0802 03/02/19 1137 03/02/19 1751 03/03/19 0312  HGB 14.2  --   --  13.2  --   --   --  13.7  HCT 43.4  --   --  41.0  --   --   --  40.4  PLT 252  --   --  268  --   --   --  267  HEPARINUNFRC  --   --    < > 1.00*  --  0.56 0.64 0.58  CREATININE 1.15*  --   --   --   --   --   --  1.07*  TROPONINIHS 39* 43*  --  48* 38*  --   --   --    < > = values in this interval not displayed.    Estimated Creatinine Clearance: 45.1 mL/min (A) (by C-G formula based on SCr of 1.07 mg/dL (H)).   Medical History: Past Medical History:  Diagnosis Date  . Arthritis    osteoarthritis B knees, left hip , right elbow  . Complication of anesthesia    difficult waking  . Diabetes mellitus without complication ([place])    Diet and exercise controlled   . GERD (gastroesophageal reflux disease)   . Headache   . History of kidney stones   . Hyperlipidemia   . MVP (mitral valve prolapse)    history of  . PONV (postoperative nausea and vomiting)     Medications:  Infusions:  . heparin 800 Units/hr (03/02/19 2132)    Assessment: 68 yof presented to the ED with hematuria and SOB. Found to have a bilateral PE and RLE DVT on IV heparin. She was not on anticoagulation PTA. Hematuria reported to have been resolved.   Pharmacy consulted to dose Eliquis. Patient is 68 years old, has a serum Cr of 1.07 (baseline around 1), and weight 68.2 kg.    Goal of Therapy:  Heparin level 0.3-0.7 units/ml Monitor platelets by anticoagulation protocol: Yes   Plan:  - stop heparin infusion  - Start apixaban 10 mg twice daily x 7 days, then change dose to 5 mg twice daily (first dose of 5 mg will be on 10/3.) - monitor CBC and signs of bleeding daily    Thank you,   Eddie Candle, PharmD PGY-1 Pharmacy Resident   Please check  amion for clinical pharmacist contact number

## 2019-03-03 NOTE — TOC Progression Note (Signed)
Transition of Care [facility]) - Progression Note    Patient Details  Name: Debra Leach MRN: [id number] Date of Birth: [birth year]  Transition of Care [facility]) CM/SW Contact  Zenon Mayo, RN Phone Number: 03/03/2019, 12:54 PM  Clinical Narrative:    NCM spoke with Staff RN Janett Billow, she will have the RN working with patient give her a 30 day free coupon for eliquis.  Patient states she does know what her co pay is .        Expected Discharge Plan and Services           Expected Discharge Date: 03/03/19                                     Social Determinants of Health (SDOH) Interventions    Readmission Risk Interventions No flowsheet data found.

## 2019-03-03 NOTE — Plan of Care (Signed)
  Problem: Education: Goal: Knowledge of General Education information will improve Description: Including pain rating scale, medication(s)/side effects and non-pharmacologic comfort measures Outcome: Adequate for Discharge   Problem: Clinical Measurements: Goal: Ability to maintain clinical measurements within normal limits will improve Outcome: Adequate for Discharge Goal: Will remain free from infection Outcome: Adequate for Discharge Goal: Diagnostic test results will improve Outcome: Adequate for Discharge Goal: Respiratory complications will improve Outcome: Adequate for Discharge Goal: Cardiovascular complication will be avoided Outcome: Adequate for Discharge   Problem: Health Behavior/Discharge Planning: Goal: Ability to manage health-related needs will improve Outcome: Adequate for Discharge

## 2019-03-03 NOTE — Plan of Care (Signed)

## 2019-03-03 NOTE — Progress Notes (Signed)
[facility] for Heparin Indication: pulmonary embolus  Allergies  Allergen Reactions  . Atenolol   . Codeine     REACTION: Migraine  . Penicillins Itching    Has patient had a PCN reaction causing immediate rash, facial/tongue/throat swelling, SOB or lightheadedness with hypotension: No Has patient had a PCN reaction causing severe rash involving mucus membranes or skin necrosis: No Has patient had a PCN reaction that required hospitalization: No Has patient had a PCN reaction occurring within the last 10 years: No If all of the above answers are "NO", then may proceed with Cephalosporin use.   Marland Kitchen Pentazocine Lactate     REACTION: Swelling, itching, rash  . Crestor [Rosuvastatin] Other (See Comments)  . Sudafed [Pseudoephedrine Hcl] Itching and Anxiety    Patient Measurements: Height: 5\' 1"  (154.9 cm) Weight: 150 lb 6.4 oz (68.2 kg) IBW/kg (Calculated) : 47.8 Heparin Dosing Weight: 62.1kg  Vital Signs: Temp: 98.9 F (37.2 C) (09/26 0614) Temp Source: Oral (09/26 0614) BP: 151/77 (09/26 [id number]) Pulse Rate: 86 (09/26 0614)  Labs: Recent Labs    03/01/19 1822 03/01/19 2044  03/02/19 0325 03/02/19 0802 03/02/19 1137 03/02/19 1751 03/03/19 0312  HGB 14.2  --   --  13.2  --   --   --  13.7  HCT 43.4  --   --  41.0  --   --   --  40.4  PLT 252  --   --  268  --   --   --  267  HEPARINUNFRC  --   --    < > 1.00*  --  0.56 0.64 0.58  CREATININE 1.15*  --   --   --   --   --   --  1.07*  TROPONINIHS 39* 43*  --  48* 38*  --   --   --    < > = values in this interval not displayed.    Estimated Creatinine Clearance: 45.1 mL/min (A) (by C-G formula based on SCr of 1.07 mg/dL (H)).   Medical History: Past Medical History:  Diagnosis Date  . Arthritis    osteoarthritis B knees, left hip , right elbow  . Complication of anesthesia    difficult waking  . Diabetes mellitus without complication ([place])    Diet and exercise controlled  .  GERD (gastroesophageal reflux disease)   . Headache   . History of kidney stones   . Hyperlipidemia   . MVP (mitral valve prolapse)    history of  . PONV (postoperative nausea and vomiting)     Medications:  Infusions:  . heparin 800 Units/hr (03/02/19 2132)    Assessment: 55 yof presented to the ED with hematuria and SOB. Found to have a bilateral PE and RLE DVT on IV heparin. She was not on anticoagulation PTA.   Heparin level continues to be at goal. CBC stable. Hematuria noted previously but seems to be improving. No changes warranted at this time.   Goal of Therapy:  Heparin level 0.3-0.7 units/ml Monitor platelets by anticoagulation protocol: Yes   Plan:  -Continue heparin at 800 units/hr -Monitor daily heparin level and CBC -F/u oral AC plans > pt seems stable, will likely transition soon.  Kennon Holter, PharmD PGY1 Ambulatory Care Pharmacy Resident [employer] Phone: [phone] 03/03/2019 7:07 AM

## 2019-03-04 NOTE — Discharge Summary (Signed)
Physician Discharge Summary  Debra Leach [id number] DOB: 22-Aug-1950 DOA: 03/01/2019  PCP: [name], MD  Admit date: 03/01/2019 Discharge date: 03/03/2019  Admitted From: Home. Disposition:  Home   Recommendations for Outpatient Follow-up:  1. Follow up with PCP in 1-2 weeks 2. Please obtain BMP/CBC in one week 3. Recommend outpatient follow up with 1.2 cm breast nodule with a mammogram in one week.    Discharge Condition: stable.  CODE STATUS: full code.  Diet recommendation: Heart Healthy   Brief/Interim Summary:  68 year old lady with prior h/o of hyperlipidemia, DM, MVP, GERD presents to ED with dyspnea, .  She was found to have acute pulmonary embolism and RLE DVT.  She was started on IV heparin and transitioned to eliquis on discharge.    Discharge Diagnoses:  Principal Problem:   Acute pulmonary embolism (HCC) Active Problems:   Hyperlipidemia   Diabetes mellitus with no complication (HCC)   Leukocytosis   Breast nodule  Acute pulmonary embolism and right lower extremity DVT:  She was started on IV heparin and transitioned to eliquis on discharge.  echocardiogram showed LVEF of Left ventricular ejection fraction, by visual estimation, is 70 to 75%. The left ventricle has hyperdynamic function. There is mildly increased left ventricular hypertrophy. Spectral Doppler shows Left ventricular diastolic Doppler  parameters are consistent with impaired relaxation pattern of LV diastolic filling. Global RV systolic function is has normal systolic function. Tachypnea and tachycardia improved.    Hematuria:  Appears to have resolved.    Hypertensive urgency:  Probably sec to migraine headache.  Resolved. recommended to check BP every day and see PCP in one week to see if she needed to be started on anti hypertensive's.  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   Migraine: Prn imitrex and phenergan ordered and its resolved.    1.2 cm left breast nodule:  Recommend outpatient follow up with mammogram and PCP.   Diabetes mellitus:  Well controlled CBG'S .  Decreased the dose of lantus to 20 units as her cbg's have been low around    Discharge Instructions  Discharge Instructions    Diet - low sodium heart healthy   Complete by: As directed    Discharge instructions   Complete by: As directed    Please follow up with PCP in one week.     Allergies as of 03/03/2019      Reactions   Atenolol    Codeine    REACTION: Migraine   Penicillins Itching   Has patient had a PCN reaction causing immediate rash, facial/tongue/throat swelling, SOB or lightheadedness with hypotension: No Has patient had a PCN reaction causing severe rash involving mucus membranes or skin necrosis: No Has patient had a PCN reaction that required hospitalization: No Has patient had a PCN reaction occurring within the last 10 years: No If all of the above answers are "NO", then may proceed with Cephalosporin use.   Pentazocine Lactate    REACTION: Swelling, itching, rash   Crestor [rosuvastatin] Other (See Comments)   Sudafed [pseudoephedrine Hcl] Itching, Anxiety      Medication List    STOP taking these medications   Lantus SoloStar 100 UNIT/ML Solostar Pen Generic drug: Insulin Glargine Replaced by: insulin glargine 100 UNIT/ML injection     TAKE these medications   apixaban 5 MG Tabs tablet Commonly known as: ELIQUIS Take 2 tablets (10 mg total) by mouth 2 (two) times daily for 7 days.   apixaban 5 MG  Tabs tablet Commonly known as: ELIQUIS Take 1 tablet (5 mg total) by mouth 2 (two) times daily. Start taking on: March 10, 2019   atorvastatin 80 MG tablet Commonly known as: LIPITOR Take 1 tablet (80 mg total) by mouth daily.   BOTOX IJ Inject 155 Units as directed.   Budesonide ER 6 MG Cp24 Take 1 tablet by mouth daily.   cholecalciferol 1000 units tablet Commonly known as: VITAMIN D Take 1,000 Units by  mouth daily.   colesevelam 625 MG tablet Commonly known as: WELCHOL Take 3 tablets (1,875 mg total) by mouth 2 (two) times daily with a meal.   gabapentin 100 MG capsule Commonly known as: Neurontin Take 1 capsule (100 mg total) by mouth 3 (three) times daily as needed. Up to three times a day What changed: Another medication with the same name was changed. Make sure you understand how and when to take each.   gabapentin 300 MG capsule Commonly known as: NEURONTIN Take 1 capsule (300 mg total) by mouth 2 (two) times daily. What changed:   how much to take  when to take this   Galcanezumab-gnlm 120 MG/ML Sosy Commonly known as: Emgality Inject 120 mg into the skin every 30 (thirty) days.   insulin glargine 100 UNIT/ML injection Commonly known as: LANTUS Inject 0.2 mLs (20 Units total) into the skin daily. Replaces: Lantus SoloStar 100 UNIT/ML Solostar Pen   omeprazole 20 MG capsule Commonly known as: PRILOSEC Take 20 mg by mouth every morning.   OneTouch Delica Plus 0000000 Misc USE TO CHECK BLOOD SUGAR TWO TIMES DAILY   OneTouch Verio test strip Generic drug: glucose blood USE TO CHECK BLOOD SUGAR TWO TIMES A DAY   promethazine 12.5 MG tablet Commonly known as: PHENERGAN Take 12.5 mg by mouth every 6 (six) hours as needed for nausea or vomiting.   Ubrelvy 100 MG Tabs Generic drug: Ubrogepant Take 1 tablet by mouth as needed (May repeat dose after 2 hours if needed.  Maximum 2 tablets in 24 hours).   UltiCare Short Pen Needles 31G X 8 MM Misc Generic drug: Insulin Pen Needle USE TO INJECT INSULIN DAILY   VITAMIN B2 PO Take 400 mg by mouth daily.      Follow-up Information    [name], MD. Schedule an appointment as soon as possible for a visit in 1 week(s).   Specialty: Family Medicine Contact information: [place] [address] [phone]          Allergies  Allergen Reactions  . Atenolol   . Codeine      REACTION: Migraine  . Penicillins Itching    Has patient had a PCN reaction causing immediate rash, facial/tongue/throat swelling, SOB or lightheadedness with hypotension: No Has patient had a PCN reaction causing severe rash involving mucus membranes or skin necrosis: No Has patient had a PCN reaction that required hospitalization: No Has patient had a PCN reaction occurring within the last 10 years: No If all of the above answers are "NO", then may proceed with Cephalosporin use.   Marland Kitchen Pentazocine Lactate     REACTION: Swelling, itching, rash  . Crestor [Rosuvastatin] Other (See Comments)  . Sudafed [Pseudoephedrine Hcl] Itching and Anxiety    Consultations:  None.    Procedures/Studies: Ct Angio Chest Pe W And/or Wo Contrast  Result Date: 03/01/2019 CLINICAL DATA:  Increasing shortness of breath and lightheadedness for the past week. EXAM: CT ANGIOGRAPHY CHEST WITH CONTRAST TECHNIQUE: Multidetector CT imaging  of the chest was performed using the standard protocol during bolus administration of intravenous contrast. Multiplanar CT image reconstructions and MIPs were obtained to evaluate the vascular anatomy. CONTRAST:  7mL OMNIPAQUE IOHEXOL 300 MG/ML  SOLN COMPARISON:  Chest x-ray from same day. FINDINGS: Cardiovascular: Satisfactory opacification of the pulmonary arteries to the segmental level. Acute bilateral lower lobe and right upper lobe segmental and subsegmental pulmonary emboli. Normal heart size. RV/LV ratio of 1.5. No pericardial effusion. No thoracic aortic aneurysm or dissection. Mild atherosclerotic calcification of the aortic arch. Mediastinum/Nodes: No enlarged mediastinal, hilar, or axillary lymph nodes. Calcified right hilar lymph nodes. 8 mm hypodense nodule in the right thyroid lobe. The trachea and esophagus demonstrate no significant findings. Small hiatal hernia. Lungs/Pleura: Subsegmental atelectasis at the lung bases. No focal consolidation, pleural effusion, or  pneumothorax. No suspicious pulmonary nodule. Upper Abdomen: No acute abnormality. Musculoskeletal: 1.2 cm nodule in the left breast. No acute or significant osseous findings. Elevation of the right breast. Review of the MIP images confirms the above findings. IMPRESSION: 1. Acute bilateral lower lobe and right upper lobe segmental and subsegmental pulmonary emboli. 2. 1.2 cm left breast nodule.  Correlate with mammographic history. 3.  Aortic atherosclerosis (ICD10-I70.0). Critical Value/emergent results were called by telephone at the time of interpretation on 03/01/2019 at 9:07 pm to provider [facility], who verbally acknowledged these results. Electronically Signed   By: Titus Dubin M.D.   On: 03/01/2019 21:08   Dg Chest Port 1 View  Result Date: 03/01/2019 CLINICAL DATA:  68 year old female with dyspnea EXAM: PORTABLE CHEST 1 VIEW COMPARISON:  August 12, 2008 FINDINGS: Cardiomediastinal silhouette unchanged in size and contour. Interval worsening of asymmetric elevation of the right hemidiaphragm. No pneumothorax. No pleural effusion. No confluent airspace disease. Mild coarsening of interstitial markings. No displaced fracture IMPRESSION: Negative for acute cardiopulmonary disease. Interval worsening of asymmetric elevation of the right hemidiaphragm. Electronically Signed   By: Corrie Mckusick D.O.   On: 03/01/2019 18:49   Vas [place] Lower Extremity Venous (dvt)  Result Date: 03/03/2019  Lower Venous Study Indications: Pulmonary embolism.  Risk Factors: Confirmed PE. Comparison Study: Np previous study available Performing Technologist: [employer] [facility]  Examination Guidelines: A complete evaluation includes B-mode imaging, spectral Doppler, color Doppler, and power Doppler as needed of all accessible portions of each vessel. Bilateral testing is considered an integral part of a complete examination. Limited examinations for reoccurring indications may be performed as noted.   +---------+---------------+---------+-----------+----------+------------------+ RIGHT    CompressibilityPhasicitySpontaneityPropertiesThrombus Aging     +---------+---------------+---------+-----------+----------+------------------+ CFV      Full           Yes      Yes                                     +---------+---------------+---------+-----------+----------+------------------+ SFJ      Full                                                            +---------+---------------+---------+-----------+----------+------------------+ FV Prox  Full           Yes      Yes                                     +---------+---------------+---------+-----------+----------+------------------+  FV Mid   Full                                                            +---------+---------------+---------+-----------+----------+------------------+ FV DistalFull           Yes      Yes                                     +---------+---------------+---------+-----------+----------+------------------+ PFV      Full           Yes      Yes                                     +---------+---------------+---------+-----------+----------+------------------+ POP      Partial        Yes      Yes                  Acute distal                                                             region             +---------+---------------+---------+-----------+----------+------------------+ PTV      Partial                                      Acute mid to                                                             proximal           +---------+---------------+---------+-----------+----------+------------------+ PERO     Partial                                      Acute mid to                                                             proximal           +---------+---------------+---------+-----------+----------+------------------+    +---------+---------------+---------+-----------+----------+--------------+ LEFT     CompressibilityPhasicitySpontaneityPropertiesThrombus Aging +---------+---------------+---------+-----------+----------+--------------+ CFV      Full           Yes      Yes                                 +---------+---------------+---------+-----------+----------+--------------+  SFJ      Full                                                        +---------+---------------+---------+-----------+----------+--------------+ FV Prox  Full           Yes      Yes                                 +---------+---------------+---------+-----------+----------+--------------+ FV Mid   Full                                                        +---------+---------------+---------+-----------+----------+--------------+ FV DistalFull           Yes      Yes                                 +---------+---------------+---------+-----------+----------+--------------+ PFV      Full           Yes      Yes                                 +---------+---------------+---------+-----------+----------+--------------+ POP      Full           Yes      Yes                                 +---------+---------------+---------+-----------+----------+--------------+ PTV      Full                                                        +---------+---------------+---------+-----------+----------+--------------+ PERO     Full                                                        +---------+---------------+---------+-----------+----------+--------------+     Summary: Right: Findings consistent with acute deep vein thrombosis involving the right popliteal vein, right posterior tibial veins, and right peroneal veins. No cystic structure found in the popliteal fossa. Left: There is no evidence of deep vein thrombosis in the lower extremity. No cystic structure found in the popliteal fossa.  *See table(s)  above for measurements and observations. Electronically signed by Curt Jews MD on 03/03/2019 at 4:03:04 AM.    Final        Subjective: No new complaints.   Discharge Exam: Vitals:   03/02/19 2110 03/03/19 0614  BP: 137/74 (!) 151/77  Pulse: 88 86  Resp: 20 20  Temp: 98.7 F (37.1 C) 98.9 F (37.2 C)  SpO2: 98% 99%   Vitals:  03/02/19 1130 03/02/19 1359 03/02/19 2110 03/03/19 0614  BP: (!) 157/76 (!) 152/87 137/74 (!) 151/77  Pulse: (!) 107 (!) 109 88 86  Resp:   20 20  Temp:  98.9 F (37.2 C) 98.7 F (37.1 C) 98.9 F (37.2 C)  TempSrc:  Oral Oral Oral  SpO2: 96% 94% 98% 99%  Weight:    68.2 kg  Height:        General: Pt is alert, awake, not in acute distress Cardiovascular: RRR, S1/S2 +, no rubs, no gallops Respiratory: CTA bilaterally, no wheezing, no rhonchi Abdominal: Soft, NT, ND, bowel sounds + Extremities: no edema, no cyanosis    The results of significant diagnostics from this hospitalization (including imaging, microbiology, ancillary and laboratory) are listed below for reference.     Microbiology: Recent Results (from the past 240 hour(s))  Urine Culture     Status: None   Collection Time: 03/01/19  4:01 PM   Specimen: Urine  Result Value Ref Range Status   MICRO NUMBER: [id number]  Final   SPECIMEN QUALITY: Adequate  Final   Sample Source URINE  Final   STATUS: FINAL  Final   ISOLATE 1:   Final    Growth of mixed flora was isolated, suggesting probable contamination. No further testing will be performed. If clinically indicated, recollection using a method to minimize contamination, with prompt transfer to Urine Culture Transport Tube, is  recommended.   SARS Coronavirus 2 [facility] order, Performed in [facility] hospital lab) Nasopharyngeal Nasopharyngeal Swab     Status: None   Collection Time: 03/01/19  6:22 PM   Specimen: Nasopharyngeal Swab  Result Value Ref Range Status   SARS Coronavirus 2 NEGATIVE NEGATIVE Final    Comment:  (NOTE) If result is NEGATIVE SARS-CoV-2 target nucleic acids are NOT DETECTED. The SARS-CoV-2 RNA is generally detectable in upper and lower  respiratory specimens during the acute phase of infection. The lowest  concentration of SARS-CoV-2 viral copies this assay can detect is 250  copies / mL. A negative result does not preclude SARS-CoV-2 infection  and should not be used as the sole basis for treatment or other  patient management decisions.  A negative result may occur with  improper specimen collection / handling, submission of specimen other  than nasopharyngeal swab, presence of viral mutation(s) within the  areas targeted by this assay, and inadequate number of viral copies  (<250 copies / mL). A negative result must be combined with clinical  observations, patient history, and epidemiological information. If result is POSITIVE SARS-CoV-2 target nucleic acids are DETECTED. The SARS-CoV-2 RNA is generally detectable in upper and lower  respiratory specimens dur ing the acute phase of infection.  Positive  results are indicative of active infection with SARS-CoV-2.  Clinical  correlation with patient history and other diagnostic information is  necessary to determine patient infection status.  Positive results do  not rule out bacterial infection or co-infection with other viruses. If result is PRESUMPTIVE POSTIVE SARS-CoV-2 nucleic acids MAY BE PRESENT.   A presumptive positive result was obtained on the submitted specimen  and confirmed on repeat testing.  While 2019 novel coronavirus  (SARS-CoV-2) nucleic acids may be present in the submitted sample  additional confirmatory testing may be necessary for epidemiological  and / or clinical management purposes  to differentiate between  SARS-CoV-2 and other Sarbecovirus currently known to infect humans.  If clinically indicated additional testing with an alternate test  methodology [phone]) is advised. The SARS-CoV-2 RNA  is  generally  detectable in upper and lower respiratory sp ecimens during the acute  phase of infection. The expected result is Negative. Fact Sheet for Patients:  StrictlyIdeas.no Fact Sheet for Healthcare Providers: BankingDealers.co.za This test is not yet approved or cleared by the [place] FDA and has been authorized for detection and/or diagnosis of SARS-CoV-2 by FDA under an Emergency Use Authorization (EUA).  This EUA will remain in effect (meaning this test can be used) for the duration of the COVID-19 declaration under Section 564(b)(1) of the Act, 21 U.S.C. section 360bbb-3(b)(1), unless the authorization is terminated or revoked sooner. Performed at [facility], [place] [address], [place], [place] [address]      Labs: BNP (last 3 results) Recent Labs    03/01/19 1822  BNP [id number]   Basic Metabolic Panel: Recent Labs  Lab 03/01/19 1822 03/03/19 0312  NA 144 147*  K 4.0 3.8  CL 108 111  CO2 24 28  GLUCOSE 131* 69*  BUN 16 12  CREATININE 1.15* 1.07*  CALCIUM 9.2 8.9   Liver Function Tests: Recent Labs  Lab 03/01/19 1822  AST 22  ALT 32  ALKPHOS 71  BILITOT 0.5  PROT 6.7  ALBUMIN 3.2*   No results for input(s): LIPASE, AMYLASE in the last 168 hours. No results for input(s): AMMONIA in the last 168 hours. CBC: Recent Labs  Lab 03/01/19 1822 03/02/19 0325 03/03/19 0312  WBC 11.3* 10.7* 9.2  NEUTROABS 9.1*  --   --   HGB 14.2 13.2 13.7  HCT 43.4 41.0 40.4  MCV 92.3 91.5 91.6  PLT 252 268 267   Cardiac Enzymes: No results for input(s): CKTOTAL, CKMB, CKMBINDEX, TROPONINI in the last 168 hours. BNP: Invalid input(s): POCBNP CBG: Recent Labs  Lab 03/02/19 1611 03/02/19 2109 03/03/19 0742 03/03/19 1040 03/03/19 1106  GLUCAP 70 171* 76 117* 102*   D-Dimer No results for input(s): DDIMER in the last 72 hours. Hgb A1c Recent Labs    03/02/19 0325  HGBA1C 9.2*   Lipid Profile No  results for input(s): CHOL, HDL, LDLCALC, TRIG, CHOLHDL, LDLDIRECT in the last 72 hours. Thyroid function studies No results for input(s): TSH, T4TOTAL, T3FREE, THYROIDAB in the last 72 hours.  Invalid input(s): FREET3 Anemia work up No results for input(s): VITAMINB12, FOLATE, FERRITIN, TIBC, IRON, RETICCTPCT in the last 72 hours. Urinalysis    Component Value Date/Time   COLORURINE YELLOW 03/01/2019 1820   APPEARANCEUR HAZY (A) 03/01/2019 1820   LABSPEC 1.005 03/01/2019 1820   PHURINE 5.0 03/01/2019 1820   GLUCOSEU NEGATIVE 03/01/2019 1820   HGBUR MODERATE (A) 03/01/2019 1820   BILIRUBINUR NEGATIVE 03/01/2019 1820   BILIRUBINUR Negative 03/01/2019 [place] 03/01/2019 1820   PROTEINUR NEGATIVE 03/01/2019 1820   UROBILINOGEN 0.2 03/01/2019 1551   NITRITE NEGATIVE 03/01/2019 1820   LEUKOCYTESUR SMALL (A) 03/01/2019 1820   Sepsis Labs Invalid input(s): PROCALCITONIN,  WBC,  LACTICIDVEN Microbiology Recent Results (from the past 240 hour(s))  Urine Culture     Status: None   Collection Time: 03/01/19  4:01 PM   Specimen: Urine  Result Value Ref Range Status   MICRO NUMBER: [id number]  Final   SPECIMEN QUALITY: Adequate  Final   Sample Source URINE  Final   STATUS: FINAL  Final   ISOLATE 1:   Final    Growth of mixed flora was isolated, suggesting probable contamination. No further testing will be performed. If clinically indicated, recollection using a method to  minimize contamination, with prompt transfer to Urine Culture Transport Tube, is  recommended.   SARS Coronavirus 2 [facility] order, Performed in [facility] hospital lab) Nasopharyngeal Nasopharyngeal Swab     Status: None   Collection Time: 03/01/19  6:22 PM   Specimen: Nasopharyngeal Swab  Result Value Ref Range Status   SARS Coronavirus 2 NEGATIVE NEGATIVE Final    Comment: (NOTE) If result is NEGATIVE SARS-CoV-2 target nucleic acids are NOT DETECTED. The SARS-CoV-2 RNA is generally detectable  in upper and lower  respiratory specimens during the acute phase of infection. The lowest  concentration of SARS-CoV-2 viral copies this assay can detect is 250  copies / mL. A negative result does not preclude SARS-CoV-2 infection  and should not be used as the sole basis for treatment or other  patient management decisions.  A negative result may occur with  improper specimen collection / handling, submission of specimen other  than nasopharyngeal swab, presence of viral mutation(s) within the  areas targeted by this assay, and inadequate number of viral copies  (<250 copies / mL). A negative result must be combined with clinical  observations, patient history, and epidemiological information. If result is POSITIVE SARS-CoV-2 target nucleic acids are DETECTED. The SARS-CoV-2 RNA is generally detectable in upper and lower  respiratory specimens dur ing the acute phase of infection.  Positive  results are indicative of active infection with SARS-CoV-2.  Clinical  correlation with patient history and other diagnostic information is  necessary to determine patient infection status.  Positive results do  not rule out bacterial infection or co-infection with other viruses. If result is PRESUMPTIVE POSTIVE SARS-CoV-2 nucleic acids MAY BE PRESENT.   A presumptive positive result was obtained on the submitted specimen  and confirmed on repeat testing.  While 2019 novel coronavirus  (SARS-CoV-2) nucleic acids may be present in the submitted sample  additional confirmatory testing may be necessary for epidemiological  and / or clinical management purposes  to differentiate between  SARS-CoV-2 and other Sarbecovirus currently known to infect humans.  If clinically indicated additional testing with an alternate test  methodology [phone]) is advised. The SARS-CoV-2 RNA is generally  detectable in upper and lower respiratory sp ecimens during the acute  phase of infection. The expected result is  Negative. Fact Sheet for Patients:  StrictlyIdeas.no Fact Sheet for Healthcare Providers: BankingDealers.co.za This test is not yet approved or cleared by the [place] FDA and has been authorized for detection and/or diagnosis of SARS-CoV-2 by FDA under an Emergency Use Authorization (EUA).  This EUA will remain in effect (meaning this test can be used) for the duration of the COVID-19 declaration under Section 564(b)(1) of the Act, 21 U.S.C. section 360bbb-3(b)(1), unless the authorization is terminated or revoked sooner. Performed at [facility], [place] [address], [place], [place] [address]      Time coordinating discharge: 31 minutes  SIGNED:   Hosie Poisson, MD  [facility] 03/04/2019, 8:49 AM Pager   If 7PM-7AM, please contact night-coverage [email or web address] Password [email or web address]

## 2019-03-05 ENCOUNTER — Telehealth: Payer: Self-pay

## 2019-03-05 NOTE — Telephone Encounter (Signed)
Transition Care Management Follow-up Telephone Call   Date discharged? 03/03/19   How have you been since you were released from the hospital?  " I am far better than I was and am doing okay right now.  Some mild shortness of breath but nothing like it was and improving!"   Do you understand why you were in the hospital? Yes   Do you understand the discharge instructions? Yes   Where were you discharged to? Home   Items Reviewed:  Medications reviewed: Yes  Allergies reviewed: Yes  Dietary changes reviewed: no changes  Referrals reviewed: Yes   Functional Questionnaire:   Activities of Daily Living (ADLs):   She states they are independent in the following: Is Independent with all ADLS.  States they require assistance with the following: Independent with all ADLS.    Any transportation issues/concerns?: None  Any patient concerns? None  Confirmed importance and date/time of follow-up visits scheduled Yes  Provider Appointment with Dr. Diona Browner for 03/08/19 at 920am  Confirmed with patient if condition begins to worsen call PCP or go to the ER.  Patient was given the office number and encouraged to call back with question or concerns.  : Yes

## 2019-03-08 ENCOUNTER — Inpatient Hospital Stay: Payer: Medicare Other | Admitting: Family Medicine

## 2019-03-13 ENCOUNTER — Ambulatory Visit (INDEPENDENT_AMBULATORY_CARE_PROVIDER_SITE_OTHER): Payer: Medicare Other | Admitting: Family Medicine

## 2019-03-13 ENCOUNTER — Encounter: Payer: Self-pay | Admitting: Family Medicine

## 2019-03-13 ENCOUNTER — Other Ambulatory Visit: Payer: Self-pay

## 2019-03-13 VITALS — BP 152/90 | HR 109 | Temp 98.1°F | Ht 60.5 in | Wt 152.8 lb

## 2019-03-13 DIAGNOSIS — E119 Type 2 diabetes mellitus without complications: Secondary | ICD-10-CM

## 2019-03-13 DIAGNOSIS — E041 Nontoxic single thyroid nodule: Secondary | ICD-10-CM

## 2019-03-13 DIAGNOSIS — I824Y1 Acute embolism and thrombosis of unspecified deep veins of right proximal lower extremity: Secondary | ICD-10-CM

## 2019-03-13 DIAGNOSIS — K861 Other chronic pancreatitis: Secondary | ICD-10-CM

## 2019-03-13 DIAGNOSIS — Z86718 Personal history of other venous thrombosis and embolism: Secondary | ICD-10-CM | POA: Insufficient documentation

## 2019-03-13 DIAGNOSIS — N63 Unspecified lump in unspecified breast: Secondary | ICD-10-CM | POA: Diagnosis not present

## 2019-03-13 DIAGNOSIS — I2699 Other pulmonary embolism without acute cor pulmonale: Secondary | ICD-10-CM

## 2019-03-13 DIAGNOSIS — E042 Nontoxic multinodular goiter: Secondary | ICD-10-CM

## 2019-03-13 MED ORDER — LOSARTAN POTASSIUM-HCTZ 50-12.5 MG PO TABS: 1.0000 | ORAL_TABLET | Freq: Every day | ORAL | 3 refills | Status: DC

## 2019-03-13 MED ORDER — APIXABAN 5 MG PO TABS: 5.0000 mg | ORAL_TABLET | Freq: Two times a day (BID) | ORAL | 1 refills | Status: DC

## 2019-03-13 MED ORDER — LANTUS SOLOSTAR 100 UNIT/ML ~~LOC~~ SOPN: 40.0000 [IU] | PEN_INJECTOR | Freq: Every day | SUBCUTANEOUS | 11 refills | Status: DC

## 2019-03-13 NOTE — Progress Notes (Signed)
Chief Complaint  Patient presents with  . Hospitalization Follow-up    PE    History of Present Illness: HPI   68 year old female presents for hospital follow up.   She was admitted on 03/01/2019 from the office with elevated BP, DOE... Dx with PE and RLE DVT Hospital course copied as follows: She was started on IV heparin and transitioned to eliquis on discharge.  Echocardiogram showed LVEF of Left ventricular ejection fraction, by visual estimation, is 70 to 75%. The left ventricle has hyperdynamic function. There is mildly increased left ventricular hypertrophy. Spectral Doppler shows Left ventricular diastolic Doppler  parameters are consistent with impaired relaxation pattern of LV diastolic filling. Global RV systolic function is has normal systolic function. Tachypnea and tachycardia improved.   Hematuria:  Appears to have resolved.   Hypertensive urgency:  Probably sec to migraine headache.  Resolved. recommended to check BP every day and see PCP in one week to see if she needed to be started on anti hypertensive's  DM... CBGs low in hospital so decreased Lantus to 20 units daily.. but now eating better and CBG 125 at home fasting... returned to 40 Units daily.  Recommendations for Outpatient Follow-up:  1. Follow up with PCP in 1-2 weeks 2. Please obtain BMP/CBC in one week 3. Recommend outpatient follow up with 1.2 cm breast nodule with a mammogram in one week.     Cause of DVT/PE... she is on steroid ( on budesonide), she has  No family history of PE/DVT, clotting disorder.  She had been short of breath more so in last 2-3 weeks prior to DVT/PE.  She had been able to do less activity, sitting more but still  Moving around house every day. She walks  Dogs every day twice daily.   She has been having new cramping in  Left lower leg off and on. No  Pain in right leg, no swelling in either leg. Still DOE, but improving.  Her migraines have been severe. BPs running  140-151/70-80  ( SE to BBlockers in past)   Lab Results  Component Value Date   HGBA1C 9.2 (H) 03/02/2019   Normal mammogram 02/01/2019... per pt this nodule has been present in left breast for years.   CT also noted 8 mm nodule in right thyroid... stable 2018 mulitple small nodules.  COVID 19 screen No recent travel or known exposure to COVID19 The patient denies respiratory symptoms of COVID 19 at this time.  The importance of social distancing was discussed today.   Review of Systems  Constitutional: Negative for chills and fever.  HENT: Negative for congestion and ear pain.   Eyes: Negative for pain and redness.  Respiratory: Positive for shortness of breath. Negative for cough.   Cardiovascular: Negative for chest pain, palpitations and leg swelling.  Gastrointestinal: Negative for abdominal pain, blood in stool, constipation, diarrhea, nausea and vomiting.  Genitourinary: Negative for dysuria.  Musculoskeletal: Negative for falls and myalgias.  Skin: Negative for rash.  Neurological: Negative for dizziness.  Psychiatric/Behavioral: Negative for depression. The patient is not nervous/anxious.       Past Medical History:  Diagnosis Date  . Arthritis    osteoarthritis B knees, left hip , right elbow  . Complication of anesthesia    difficult waking  . Diabetes mellitus without complication ([place])    Diet and exercise controlled  . GERD (gastroesophageal reflux disease)   . Headache   . History of kidney stones   . Hyperlipidemia   .  MVP (mitral valve prolapse)    history of  . PONV (postoperative nausea and vomiting)     reports that she has quit smoking. She has never used smokeless tobacco. She reports that she does not drink alcohol or use drugs.   Current Outpatient Medications:  .  apixaban (ELIQUIS) 5 MG TABS tablet, Take 1 tablet (5 mg total) by mouth 2 (two) times daily., Disp: 60 tablet, Rfl: 1 .  atorvastatin (LIPITOR) 80 MG tablet, Take 1 tablet (80 mg  total) by mouth daily., Disp: 90 tablet, Rfl: 3 .  budesonide (ENTOCORT EC) 3 MG 24 hr capsule, Take 6 mg by mouth daily. , Disp: , Rfl:  .  cholecalciferol (VITAMIN D) 1000 units tablet, Take 1,000 Units by mouth daily., Disp: , Rfl:  .  colesevelam (WELCHOL) 625 MG tablet, Take 3 tablets (1,875 mg total) by mouth 2 (two) times daily with a meal., Disp: 540 tablet, Rfl: 3 .  gabapentin (NEURONTIN) 100 MG capsule, Take 1 capsule (100 mg total) by mouth 3 (three) times daily as needed. Up to three times a day, Disp: 270 capsule, Rfl: 0 .  gabapentin (NEURONTIN) 300 MG capsule, Take 600 mg by mouth at bedtime., Disp: , Rfl:  .  Galcanezumab-gnlm (EMGALITY) 120 MG/ML SOSY, Inject 120 mg into the skin every 30 (thirty) days., Disp: 1 Syringe, Rfl: 0 .  Insulin Glargine (LANTUS SOLOSTAR) 100 UNIT/ML Solostar Pen, Inject 40 Units into the skin daily., Disp: , Rfl:  .  Insulin Pen Needle (ULTICARE SHORT PEN NEEDLES) 31G X 8 MM MISC, USE TO INJECT INSULIN DAILY, Disp: 100 each, Rfl: 2 .  Lancets (ONETOUCH DELICA PLUS 123XX123) MISC, USE TO CHECK BLOOD SUGAR TWO TIMES DAILY, Disp: 100 each, Rfl: 12 .  omeprazole (PRILOSEC) 20 MG capsule, Take 20 mg by mouth every morning., Disp: , Rfl:  .  OnabotulinumtoxinA (BOTOX IJ), Inject 155 Units as directed. , Disp: , Rfl:  .  ONETOUCH VERIO test strip, USE TO CHECK BLOOD SUGAR TWO TIMES A DAY, Disp: 100 strip, Rfl: 12 .  promethazine (PHENERGAN) 12.5 MG tablet, Take 12.5 mg by mouth every 6 (six) hours as needed for nausea or vomiting., Disp: , Rfl:  .  Riboflavin (VITAMIN B2 PO), Take 400 mg by mouth daily., Disp: , Rfl:  .  Ubrogepant (UBRELVY) 100 MG TABS, Take 1 tablet by mouth as needed (May repeat dose after 2 hours if needed.  Maximum 2 tablets in 24 hours)., Disp: 10 tablet, Rfl: 3   Observations/Objective: Blood pressure (!) 152/90, pulse (!) 109, temperature 98.1 F (36.7 C), temperature source Temporal, height 5' 0.5" (1.537 m), weight 152 lb 12 oz  (69.3 kg), SpO2 96 %.  Physical Exam Constitutional:      General: She is not in acute distress.    Appearance: Normal appearance. She is well-developed. She is not ill-appearing or toxic-appearing.  HENT:     Head: Normocephalic.     Right Ear: Hearing, tympanic membrane, ear canal and external ear normal. Tympanic membrane is not erythematous, retracted or bulging.     Left Ear: Hearing, tympanic membrane, ear canal and external ear normal. Tympanic membrane is not erythematous, retracted or bulging.     Nose: No mucosal edema or rhinorrhea.     Right Sinus: No maxillary sinus tenderness or frontal sinus tenderness.     Left Sinus: No maxillary sinus tenderness or frontal sinus tenderness.     Mouth/Throat:     Pharynx: Uvula midline.  Eyes:  General: Lids are normal. Lids are everted, no foreign bodies appreciated.     Conjunctiva/sclera: Conjunctivae normal.     Pupils: Pupils are equal, round, and reactive to light.  Neck:     Musculoskeletal: Normal range of motion and neck supple.     Thyroid: No thyroid mass or thyromegaly.     Vascular: No carotid bruit.     Trachea: Trachea normal.  Cardiovascular:     Rate and Rhythm: Normal rate and regular rhythm.     Pulses: Normal pulses.     Heart sounds: Normal heart sounds, S1 normal and S2 normal. No murmur. No friction rub. No gallop.   Pulmonary:     Effort: Pulmonary effort is normal. No tachypnea or respiratory distress.     Breath sounds: Normal breath sounds. No decreased breath sounds, wheezing, rhonchi or rales.  Abdominal:     General: Bowel sounds are normal.     Palpations: Abdomen is soft.     Tenderness: There is no abdominal tenderness.  Musculoskeletal:     Right lower leg: 1+ Edema present.     Left lower leg: No edema.  Skin:    General: Skin is warm and dry.     Findings: No rash.  Neurological:     Mental Status: She is alert.  Psychiatric:        Mood and Affect: Mood is not anxious or depressed.         Speech: Speech normal.        Behavior: Behavior normal. Behavior is cooperative.        Thought Content: Thought content normal.        Judgment: Judgment normal.      Assessment and Plan   Diabetes mellitus with no complication (HCC) Now back on home dose of insulin... follow CBGs.  Pulmonary embolism and infarction [facility]) Refer to hematology. Continue eliquis  DVT, lower extremity, proximal, acute, right ([place]) Continued pain, but pt reassured it will take time to resolve.  Multiple thyroid nodules Send for [place] eval.  Breast nodule Present per years per pt. Continue yearly mammograms.     Eliezer Lofts, MD

## 2019-03-13 NOTE — Patient Instructions (Addendum)
Start losartan HCTZ for BP control.  Check BP control daily at home.. call in 1-2 weeks with measurements.  Return for labs in 7-10 days.  Continue Lantus at 40 Units.  We will call you to set up hematology referral.

## 2019-03-20 ENCOUNTER — Other Ambulatory Visit: Payer: Self-pay | Admitting: Neurology

## 2019-03-20 ENCOUNTER — Other Ambulatory Visit: Payer: Self-pay | Admitting: Family Medicine

## 2019-03-21 ENCOUNTER — Other Ambulatory Visit: Payer: Self-pay

## 2019-03-21 ENCOUNTER — Other Ambulatory Visit (INDEPENDENT_AMBULATORY_CARE_PROVIDER_SITE_OTHER): Payer: Medicare Other

## 2019-03-21 ENCOUNTER — Other Ambulatory Visit: Payer: Self-pay | Admitting: Family Medicine

## 2019-03-21 DIAGNOSIS — K861 Other chronic pancreatitis: Secondary | ICD-10-CM | POA: Diagnosis not present

## 2019-03-21 DIAGNOSIS — E119 Type 2 diabetes mellitus without complications: Secondary | ICD-10-CM

## 2019-03-21 DIAGNOSIS — E041 Nontoxic single thyroid nodule: Secondary | ICD-10-CM

## 2019-03-21 LAB — BASIC METABOLIC PANEL
BUN: 18 mg/dL (ref 6–23)
CO2: 31 mEq/L (ref 19–32)
Calcium: 9.3 mg/dL (ref 8.4–10.5)
Chloride: 101 mEq/L (ref 96–112)
Creatinine, Ser: 0.95 mg/dL (ref 0.40–1.20)
GFR: 58.52 mL/min — ABNORMAL LOW (ref >60.00)
Glucose, Bld: 109 mg/dL — ABNORMAL HIGH (ref 70–99)
Potassium: 3.1 mEq/L — ABNORMAL LOW (ref 3.5–5.1)
Sodium: 142 mEq/L (ref 135–145)

## 2019-03-21 LAB — CBC WITH DIFFERENTIAL/PLATELET
Basophils Absolute: 0.1 10*3/uL (ref 0.0–0.1)
Basophils Relative: 0.9 % (ref 0.0–3.0)
Eosinophils Absolute: 0.2 10*3/uL (ref 0.0–0.7)
Eosinophils Relative: 1.7 % (ref 0.0–5.0)
HCT: 41.5 % (ref 36.0–46.0)
Hemoglobin: 13.6 g/dL (ref 12.0–15.0)
Lymphocytes Relative: 16 % (ref 12.0–46.0)
Lymphs Abs: 1.9 10*3/uL (ref 0.7–4.0)
MCHC: 32.9 g/dL (ref 30.0–36.0)
MCV: 90.6 fl (ref 78.0–100.0)
Monocytes Absolute: 0.8 10*3/uL (ref 0.1–1.0)
Monocytes Relative: 6.9 % (ref 3.0–12.0)
Neutro Abs: 9.1 10*3/uL — ABNORMAL HIGH (ref 1.4–7.7)
Neutrophils Relative %: 74.5 % (ref 43.0–77.0)
Platelets: 337 10*3/uL (ref 150.0–400.0)
RBC: 4.58 Mil/uL (ref 3.87–5.11)
RDW: 15.7 % — ABNORMAL HIGH (ref 11.5–15.5)
WBC: 12.2 10*3/uL — ABNORMAL HIGH (ref 4.0–10.5)

## 2019-03-23 ENCOUNTER — Encounter: Payer: Self-pay | Admitting: Oncology

## 2019-03-23 ENCOUNTER — Other Ambulatory Visit: Payer: Self-pay

## 2019-03-23 ENCOUNTER — Inpatient Hospital Stay: Payer: Medicare Other | Attending: Oncology | Admitting: Oncology

## 2019-03-23 VITALS — BP 151/84 | HR 117 | Temp 97.8°F | Resp 18 | Ht 62.4 in | Wt 154.4 lb

## 2019-03-23 DIAGNOSIS — E119 Type 2 diabetes mellitus without complications: Secondary | ICD-10-CM | POA: Diagnosis not present

## 2019-03-23 DIAGNOSIS — I2699 Other pulmonary embolism without acute cor pulmonale: Secondary | ICD-10-CM | POA: Diagnosis not present

## 2019-03-23 DIAGNOSIS — K219 Gastro-esophageal reflux disease without esophagitis: Secondary | ICD-10-CM

## 2019-03-23 DIAGNOSIS — E785 Hyperlipidemia, unspecified: Secondary | ICD-10-CM

## 2019-03-23 DIAGNOSIS — Z8249 Family history of ischemic heart disease and other diseases of the circulatory system: Secondary | ICD-10-CM | POA: Diagnosis not present

## 2019-03-23 DIAGNOSIS — Z7901 Long term (current) use of anticoagulants: Secondary | ICD-10-CM

## 2019-03-23 DIAGNOSIS — N63 Unspecified lump in unspecified breast: Secondary | ICD-10-CM | POA: Diagnosis not present

## 2019-03-23 DIAGNOSIS — Z79899 Other long term (current) drug therapy: Secondary | ICD-10-CM

## 2019-03-23 DIAGNOSIS — R000 Tachycardia, unspecified: Secondary | ICD-10-CM | POA: Diagnosis not present

## 2019-03-23 DIAGNOSIS — N6320 Unspecified lump in the left breast, unspecified quadrant: Secondary | ICD-10-CM | POA: Insufficient documentation

## 2019-03-23 DIAGNOSIS — Z794 Long term (current) use of insulin: Secondary | ICD-10-CM

## 2019-03-23 DIAGNOSIS — I824Y1 Acute embolism and thrombosis of unspecified deep veins of right proximal lower extremity: Secondary | ICD-10-CM

## 2019-03-23 NOTE — Progress Notes (Signed)
Patient here for initial evaluation for PE and DVT.

## 2019-03-23 NOTE — Progress Notes (Signed)
Hematology/Oncology Consult note [facility] [phone] [phone]   Patient Care Team: [name], MD as PCP - General (Family Medicine) Pieter Partridge, DO as Consulting Physician (Neurology)  REFERRING PROVIDER: [name], MD  CHIEF COMPLAINTS/REASON FOR VISIT:  Evaluation of pulmonary embolism.   HISTORY OF PRESENTING ILLNESS:  Debra Leach is a 68 y.o. female who was seen in consultation at the request of [name], MD for evaluation of pulmonary embolism.  Patient was diagnosed with acute pulmonary embolism on 03/01/2019.Marland Kitchen Patient's presenting symptoms included: Dyspnea with exertion, lower extremity cramps.  At that time she also had hematuria. CT angio chest showed acute bilateral lower lobe and a right upper lobe segmental and subsegmental pulmonary emboli. Also noticed 1.2 cm left breast nodule. US venous duplex: Showed right lower extremity acute DVT involving right popliteal vein, right posterior tibial veins, and right peroneal veins. No cystic structure found in the popliteal fossa.  Left lower extremity no DVT .  Immobilization factors: Denies any immobilization factors including car ride, air flight, surgery, trauma. History of previous thrombosis event: Denies Family history of thrombosis: Denies any  Age appropriate cancer screening: Patient is up-to-date for mammogram on 02/01/2019 which showed no significant findings.  Colonoscopy 12/07/2012 which showed no colorectal neoplasm.  Internal hemorrhoids.  12/26/2014 EGD showed normal findings.  Patient was started on heparin for anticoagulation during admission and transition to Eliquis 10 mg twice daily for a week and transitioned to Eliquis 5 mg twice daily Patient reports that she had finished Eliquis 10 mg twice daily, and for 3 to 4 days, she misunderstood the instruction at took Eliquis 5 mg daily.  She realized that and started taking Eliquis 5 mg twice  daily and has been on current regimen for more than a week. She reports overall that her shortness of breath has significantly improved.  However she did notice yesterday, after walking a few laps, she felt winded.  Lower extremity cramp has completely resolved.  She lives with her husband at home, taking care of her mother.  Admits to some stress in life.  She also feels anxious coming to the cancer center today. She measures her blood pressure and heart rate at home and both were normal this morning. Today in the clinic, her blood pressure is slightly high 151/84, with a pulse rate of 117.  Denies any palpitation or chest pain.   Review of Systems  Constitutional: Negative for appetite change, chills, fatigue and fever.  HENT:   Negative for hearing loss and voice change.   Eyes: Negative for eye problems.  Respiratory: Positive for shortness of breath. Negative for chest tightness and cough.   Cardiovascular: Negative for chest pain.  Gastrointestinal: Negative for abdominal distention, abdominal pain and blood in stool.  Endocrine: Negative for hot flashes.  Genitourinary: Negative for difficulty urinating and frequency.   Musculoskeletal: Negative for arthralgias.  Skin: Negative for itching and rash.  Neurological: Negative for extremity weakness.  Hematological: Negative for adenopathy.  Psychiatric/Behavioral: Negative for confusion.    MEDICAL HISTORY:  Past Medical History:  Diagnosis Date   Arthritis    osteoarthritis B knees, left hip , right elbow   Complication of anesthesia    difficult waking   Diabetes mellitus without complication (HCC)    Diet and exercise controlled   GERD (gastroesophageal reflux disease)    Headache    History of kidney stones    Hyperlipidemia    MVP (mitral  valve prolapse)    history of   PONV (postoperative nausea and vomiting)     SURGICAL HISTORY: Past Surgical History:  Procedure Laterality Date   arm surgery      BREAST BIOPSY Left    BREAST SURGERY  2000   breast biopsy (benign)   LITHOTRIPSY  08-12-2008   stent placed bilaterally   PARTIAL HYSTERECTOMY     Both ovaries remain, vaginal, for mennorhagia   TONSILLECTOMY     TUBAL LIGATION  1980    SOCIAL HISTORY: Social History   Socioeconomic History   Marital status: Married    Spouse name: Carloyn Manner   Number of children: 0   Years of education: Not on file   Highest education level: Not on file  Occupational History   Occupation: retired Energy manager: retired  Scientist, product/process development strain: Not on file   Food insecurity    Worry: Not on file    Inability: Not on Lexicographer needs    Medical: Not on file    Non-medical: Not on file  Tobacco Use   Smoking status: Former Smoker   Smokeless tobacco: Never Used  Substance and Sexual Activity   Alcohol use: No    Frequency: Never   Drug use: No   Sexual activity: Not on file  Lifestyle   Physical activity    Days per week: Not on file    Minutes per session: Not on file   Stress: Not on file  Relationships   Social connections    Talks on phone: Not on file    Gets together: Not on file    Attends religious service: Not on file    Active member of club or organization: Not on file    Attends meetings of clubs or organizations: Not on file    Relationship status: Not on file   Intimate partner violence    Fear of current or ex partner: Not on file    Emotionally abused: Not on file    Physically abused: Not on file    Forced sexual activity: Not on file  Other Topics Concern   Not on file  Social History Narrative   Regular exercise--yes, recumbent bike 3-4 days a week      Diet: fruits and veggies, water, eats at home, drinks a lot of milk      Patient is right-handed. She drinks 1-2 cups of coffee a day.    FAMILY HISTORY: Family History  Problem Relation Age of Onset   Cancer Mother        bone    Hypertension Father    Mitral valve prolapse Father    Asthma Brother    Arthritis Brother    Nephrolithiasis Brother    Nephrolithiasis Brother    Asthma Brother    Arthritis Brother    Aortic aneurysm Brother        ascending aortic aneuysm   Breast cancer Maternal Aunt    Breast cancer Maternal Aunt     ALLERGIES:  is allergic to atenolol; codeine; penicillins; pentazocine lactate; propranolol; crestor [rosuvastatin]; and sudafed [pseudoephedrine hcl].  MEDICATIONS:  Current Outpatient Medications  Medication Sig Dispense Refill   apixaban (ELIQUIS) 5 MG TABS tablet Take 1 tablet (5 mg total) by mouth 2 (two) times daily. 180 tablet 1   atorvastatin (LIPITOR) 80 MG tablet TAKE 1 TABLET BY MOUTH DAILY. 90 tablet 3   budesonide (ENTOCORT EC) 3 MG  24 hr capsule Take 6 mg by mouth daily.      cholecalciferol (VITAMIN D) 1000 units tablet Take 1,000 Units by mouth daily.     colesevelam (WELCHOL) 625 MG tablet TAKE 3 TABLETS BY MOUTH 2 TIMES DAILY WITH A MEAL. 540 tablet 3   gabapentin (NEURONTIN) 100 MG capsule TAKE 1 CAPSULE BY MOUTH UP TO 3 TIMES DAILY AS NEEDED. 270 capsule 3   gabapentin (NEURONTIN) 300 MG capsule TAKE 1 CAPSULE BY MOUTH 2 TIMES DAILY. 180 capsule 0   Galcanezumab-gnlm (EMGALITY) 120 MG/ML SOSY Inject 120 mg into the skin every 30 (thirty) days. 1 Syringe 0   Insulin Glargine (LANTUS SOLOSTAR) 100 UNIT/ML Solostar Pen Inject 40 Units into the skin daily. 15 pen 11   Insulin Pen Needle (ULTICARE SHORT PEN NEEDLES) 31G X 8 MM MISC USE TO INJECT INSULIN DAILY 100 each 2   Lancets (ONETOUCH DELICA PLUS 123XX123) MISC USE TO CHECK BLOOD SUGAR TWO TIMES DAILY 100 each 12   losartan-hydrochlorothiazide (HYZAAR) 50-12.5 MG tablet Take 1 tablet by mouth daily. 30 tablet 3   omeprazole (PRILOSEC) 20 MG capsule Take 20 mg by mouth every morning.     OnabotulinumtoxinA (BOTOX IJ) Inject 155 Units as directed.      ONETOUCH VERIO test strip USE TO  CHECK BLOOD SUGAR TWO TIMES A DAY 100 strip 12   promethazine (PHENERGAN) 12.5 MG tablet Take 12.5 mg by mouth every 6 (six) hours as needed for nausea or vomiting.     Riboflavin (VITAMIN B2 PO) Take 400 mg by mouth daily.     Ubrogepant (UBRELVY) 100 MG TABS Take 1 tablet by mouth as needed (May repeat dose after 2 hours if needed.  Maximum 2 tablets in 24 hours). 10 tablet 3   No current facility-administered medications for this visit.      PHYSICAL EXAMINATION: ECOG PERFORMANCE STATUS: 1 - Symptomatic but completely ambulatory Vitals:   03/23/19 0947  BP: (!) 151/84  Pulse: (!) 117  Resp: 18  Temp: 97.8 F (36.6 C)   There were no vitals filed for this visit.  Physical Exam Constitutional:      General: She is not in acute distress. HENT:     Head: Normocephalic and atraumatic.  Eyes:     General: No scleral icterus.    Pupils: Pupils are equal, round, and reactive to light.  Neck:     Musculoskeletal: Normal range of motion and neck supple.  Cardiovascular:     Rate and Rhythm: Regular rhythm. Tachycardia present.     Heart sounds: Normal heart sounds.  Pulmonary:     Effort: Pulmonary effort is normal. No respiratory distress.     Breath sounds: No wheezing.  Abdominal:     General: Bowel sounds are normal. There is no distension.     Palpations: Abdomen is soft. There is no mass.     Tenderness: There is no abdominal tenderness.  Musculoskeletal: Normal range of motion.        General: No deformity.  Skin:    General: Skin is warm and dry.     Findings: No erythema or rash.  Neurological:     Mental Status: She is alert and oriented to person, place, and time.     Cranial Nerves: No cranial nerve deficit.     Coordination: Coordination normal.  Psychiatric:        Behavior: Behavior normal.        Thought Content: Thought content normal.  RADIOGRAPHIC STUDIES: I have personally reviewed the radiological images as listed and agreed with the  findings in the report. Ct Angio Chest Pe W And/or Wo Contrast  Result Date: 03/01/2019 CLINICAL DATA:  Increasing shortness of breath and lightheadedness for the past week. EXAM: CT ANGIOGRAPHY CHEST WITH CONTRAST TECHNIQUE: Multidetector CT imaging of the chest was performed using the standard protocol during bolus administration of intravenous contrast. Multiplanar CT image reconstructions and MIPs were obtained to evaluate the vascular anatomy. CONTRAST:  44mL OMNIPAQUE IOHEXOL 300 MG/ML  SOLN COMPARISON:  Chest x-ray from same day. FINDINGS: Cardiovascular: Satisfactory opacification of the pulmonary arteries to the segmental level. Acute bilateral lower lobe and right upper lobe segmental and subsegmental pulmonary emboli. Normal heart size. RV/LV ratio of 1.5. No pericardial effusion. No thoracic aortic aneurysm or dissection. Mild atherosclerotic calcification of the aortic arch. Mediastinum/Nodes: No enlarged mediastinal, hilar, or axillary lymph nodes. Calcified right hilar lymph nodes. 8 mm hypodense nodule in the right thyroid lobe. The trachea and esophagus demonstrate no significant findings. Small hiatal hernia. Lungs/Pleura: Subsegmental atelectasis at the lung bases. No focal consolidation, pleural effusion, or pneumothorax. No suspicious pulmonary nodule. Upper Abdomen: No acute abnormality. Musculoskeletal: 1.2 cm nodule in the left breast. No acute or significant osseous findings. Elevation of the right breast. Review of the MIP images confirms the above findings. IMPRESSION: 1. Acute bilateral lower lobe and right upper lobe segmental and subsegmental pulmonary emboli. 2. 1.2 cm left breast nodule.  Correlate with mammographic history. 3.  Aortic atherosclerosis (ICD10-I70.0). Critical Value/emergent results were called by telephone at the time of interpretation on 03/01/2019 at 9:07 pm to provider [facility], who verbally acknowledged these results. Electronically Signed   By: Titus Dubin M.D.   On: 03/01/2019 21:08   Dg Chest Port 1 View  Result Date: 03/01/2019 CLINICAL DATA:  68 year old female with dyspnea EXAM: PORTABLE CHEST 1 VIEW COMPARISON:  August 12, 2008 FINDINGS: Cardiomediastinal silhouette unchanged in size and contour. Interval worsening of asymmetric elevation of the right hemidiaphragm. No pneumothorax. No pleural effusion. No confluent airspace disease. Mild coarsening of interstitial markings. No displaced fracture IMPRESSION: Negative for acute cardiopulmonary disease. Interval worsening of asymmetric elevation of the right hemidiaphragm. Electronically Signed   By: Corrie Mckusick D.O.   On: 03/01/2019 18:49   Vas [place] Lower Extremity Venous (dvt)  Result Date: 03/03/2019  Lower Venous Study Indications: Pulmonary embolism.  Risk Factors: Confirmed PE. Comparison Study: Np previous study available Performing Technologist: [employer] [facility]  Examination Guidelines: A complete evaluation includes B-mode imaging, spectral Doppler, color Doppler, and power Doppler as needed of all accessible portions of each vessel. Bilateral testing is considered an integral part of a complete examination. Limited examinations for reoccurring indications may be performed as noted.  +---------+---------------+---------+-----------+----------+------------------+  RIGHT     Compressibility Phasicity Spontaneity Properties Thrombus Aging      +---------+---------------+---------+-----------+----------+------------------+  CFV       Full            Yes       Yes                                        +---------+---------------+---------+-----------+----------+------------------+  SFJ       Full                                                                 +---------+---------------+---------+-----------+----------+------------------+  FV Prox   Full            Yes       Yes                                         +---------+---------------+---------+-----------+----------+------------------+  FV Mid    Full                                                                 +---------+---------------+---------+-----------+----------+------------------+  FV Distal Full            Yes       Yes                                        +---------+---------------+---------+-----------+----------+------------------+  PFV       Full            Yes       Yes                                        +---------+---------------+---------+-----------+----------+------------------+  POP       Partial         Yes       Yes                    Acute distal                                                                    region              +---------+---------------+---------+-----------+----------+------------------+  PTV       Partial                                          Acute mid to                                                                    proximal            +---------+---------------+---------+-----------+----------+------------------+  PERO      Partial                                          Acute mid to  proximal            +---------+---------------+---------+-----------+----------+------------------+   +---------+---------------+---------+-----------+----------+--------------+  LEFT      Compressibility Phasicity Spontaneity Properties Thrombus Aging  +---------+---------------+---------+-----------+----------+--------------+  CFV       Full            Yes       Yes                                    +---------+---------------+---------+-----------+----------+--------------+  SFJ       Full                                                             +---------+---------------+---------+-----------+----------+--------------+  FV Prox   Full            Yes       Yes                                     +---------+---------------+---------+-----------+----------+--------------+  FV Mid    Full                                                             +---------+---------------+---------+-----------+----------+--------------+  FV Distal Full            Yes       Yes                                    +---------+---------------+---------+-----------+----------+--------------+  PFV       Full            Yes       Yes                                    +---------+---------------+---------+-----------+----------+--------------+  POP       Full            Yes       Yes                                    +---------+---------------+---------+-----------+----------+--------------+  PTV       Full                                                             +---------+---------------+---------+-----------+----------+--------------+  PERO      Full                                                             +---------+---------------+---------+-----------+----------+--------------+  Summary: Right: Findings consistent with acute deep vein thrombosis involving the right popliteal vein, right posterior tibial veins, and right peroneal veins. No cystic structure found in the popliteal fossa. Left: There is no evidence of deep vein thrombosis in the lower extremity. No cystic structure found in the popliteal fossa.  *See table(s) above for measurements and observations. Electronically signed by Curt Jews MD on 03/03/2019 at 4:03:04 AM.    Final      LABORATORY DATA:  I have reviewed the data as listed Lab Results  Component Value Date   WBC 12.2 (H) 03/21/2019   HGB 13.6 03/21/2019   HCT 41.5 03/21/2019   MCV 90.6 03/21/2019   PLT 337.0 03/21/2019   Recent Labs    06/26/18 1116 10/09/18 0853 01/08/19 0940 03/01/19 1822 03/03/19 0312 03/21/19 0738  NA 144 142 144 144 147* 142  K 4.1 4.0 4.6 4.0 3.8 3.1*  CL 104 105 105 108 111 101  CO2 31 29 30 24 28 31   GLUCOSE 84 91 98 131* 69* 109*  BUN 16 16 15 16  12 18   CREATININE 1.06 1.02 0.96 1.15* 1.07* 0.95  CALCIUM 9.4 9.0 9.5 9.2 8.9 9.3  GFRNONAA  --   --   --  49* 54*  --   GFRAA  --   --   --  57* >60  --   PROT 6.5 6.7 6.6 6.7  --   --   ALBUMIN 3.8 3.7 3.8 3.2*  --   --   AST 16 20 15 22   --   --   ALT 16 24 20  32  --   --   ALKPHOS 60 72 72 71  --   --   BILITOT 0.4 0.3 0.4 0.5  --   --   BILIDIR 0.1  --   --   --   --   --    Iron/TIBC/Ferritin/ %Sat No results found for: IRON, TIBC, FERRITIN, IRONPCTSAT     ASSESSMENT & PLAN:  1. Other acute pulmonary embolism without acute cor pulmonale ([place])   2. DVT, lower extremity, proximal, acute, right ([place])   3. Tachycardia   4. Breast nodule    #Images were independently reviewed by me and discussed with patient. Unprovoked bilateral PE and DVT, agree with anticoagulation with Eliquis currently on 5 mg twice daily.  Dyspnea with exertion has improved, but not back to her baseline yet. She is tachycardic with a heart rate of 117, hemodynamically stable otherwise. Tachycardia can be secondary to recent PE, and anxiety. Recommend continue to measure her blood pressure and heart rate at home.  We discussed about long-term anticoagulation.  Plan is to continue Eliquis 5 mg twice daily for total of 6 months course and after that we will check a d-dimer.  If d-dimer is negative, will lower her dose to 2.5 mg twice daily. I will also check factor V Leiden mutation, prothrombin gene mutation in the next visit.  #Left breast nodule, patient reports that this is a chronic problem for her.  She underwent biopsy 20 years ago and was negative is not available in current EMR. .Most recent mammogram was done 02/01/2024 with negative.  We will discussed with patient about proceeding with MRI next visit.  Orders Placed This Encounter  Procedures   Factor 5 leiden    Standing Status:   Future    Standing Expiration Date:   03/22/2020   Prothrombin gene mutation    Standing Status:  Future     Standing Expiration Date:   03/22/2020   CBC with Differential/Platelet    Standing Status:   Future    Standing Expiration Date:   03/22/2020   Comprehensive metabolic panel    Standing Status:   Future    Standing Expiration Date:   03/22/2020    All questions were answered. The patient knows to call the clinic with any problems questions or concerns.  Cc Bedsole, Amy E, MD   Return of visit: 3 months Thank you for this kind referral and the opportunity to participate in the care of this patient. A copy of today's note is routed to referring provider  Total face to face encounter time for this patient visit was 45 min. >50% of the time was  spent in counseling and coordination of care.    Earlie Server, MD, PhD 03/23/2019

## 2019-03-27 ENCOUNTER — Ambulatory Visit
Admission: RE | Admit: 2019-03-27 | Discharge: 2019-03-27 | Disposition: A | Discharge status: Home / Self Care | Payer: Medicare Other | Source: Ambulatory Visit | Attending: Family Medicine | Admitting: Family Medicine

## 2019-03-27 ENCOUNTER — Other Ambulatory Visit: Payer: Self-pay

## 2019-03-27 ENCOUNTER — Encounter: Payer: Self-pay | Admitting: Family Medicine

## 2019-03-27 DIAGNOSIS — E041 Nontoxic single thyroid nodule: Secondary | ICD-10-CM | POA: Insufficient documentation

## 2019-03-27 DIAGNOSIS — E042 Nontoxic multinodular goiter: Secondary | ICD-10-CM | POA: Insufficient documentation

## 2019-04-06 ENCOUNTER — Ambulatory Visit (INDEPENDENT_AMBULATORY_CARE_PROVIDER_SITE_OTHER): Payer: Medicare Other

## 2019-04-06 ENCOUNTER — Ambulatory Visit: Payer: Medicare Other

## 2019-04-06 DIAGNOSIS — Z0000 Encounter for general adult medical examination without abnormal findings: Secondary | ICD-10-CM | POA: Diagnosis not present

## 2019-04-06 NOTE — Progress Notes (Signed)
Subjective:   Debra Leach is a 68 y.o. female who presents for Medicare Annual (Subsequent) preventive examination.  Review of Systems: N/A   This visit is being conducted through telemedicine via telephone at the nurse health advisor's home address due to the COVID-19 pandemic. This patient has given me verbal consent via doximity to conduct this visit, patient states they are participating from their home address. Patient and myself are on the telephone call. There is no referral for this visit. Some vital signs may be absent or patient reported.    Patient identification: identified by name, DOB, and current address   Cardiac Risk Factors include: advanced age (>55men, >86 women);diabetes mellitus;dyslipidemia     Objective:     Vitals: There were no vitals taken for this visit.  There is no height or weight on file to calculate BMI.  Advanced Directives 04/06/2019 03/23/2019 03/03/2019 03/02/2019 03/01/2019 01/08/2019 07/19/2017  Does Patient Have a Medical Advance Directive? Yes Yes Yes Yes Yes Yes Yes  Type of Paramedic of [place];Living will [place];Living will [place];Living will [place];Living will [place];Living will [place];Living will [place];Living will  Does patient want to make changes to medical advance directive? - - No - Patient declined - - No - Patient declined -  Copy of [place] in Chart? No - copy requested No - copy requested No - copy requested No - copy requested No - copy requested, Physician notified No - copy requested No - copy requested  Would patient like information on creating a medical advance directive? - No - Patient declined - No - Patient declined No - Patient declined - -    Tobacco Social History   Tobacco Use  Smoking Status Former Smoker  Smokeless Tobacco Never  Used     Counseling given: Not Answered   Clinical Intake:  Pre-visit preparation completed: Yes  Pain : No/denies pain Pain Score: 0-No pain     Nutritional Risks: Nausea/ vomitting/ diarrhea(nausea only) Diabetes: Yes CBG done?: No Did pt. bring in CBG monitor from home?: No  How often do you need to have someone help you when you read instructions, pamphlets, or other written materials from your doctor or pharmacy?: 1 - Never What is the last grade level you completed in school?: college degree  Interpreter Needed?: No  Information entered by :: CJohnson, LPN  Past Medical History:  Diagnosis Date  . Anemia   . Arthritis    osteoarthritis B knees, left hip , right elbow  . Clotting disorder ([place])   . Complication of anesthesia    difficult waking  . Diabetes mellitus without complication ([place])    Diet and exercise controlled  . GERD (gastroesophageal reflux disease)   . Headache   . History of kidney stones   . Hyperlipidemia   . MVP (mitral valve prolapse)    history of  . PONV (postoperative nausea and vomiting)    Past Surgical History:  Procedure Laterality Date  . arm surgery    . BREAST BIOPSY Left   . BREAST SURGERY  2000   breast biopsy (benign)  . LITHOTRIPSY  08-12-2008   stent placed bilaterally  . PARTIAL HYSTERECTOMY     Both ovaries remain, vaginal, for mennorhagia  . TONSILLECTOMY    . TUBAL LIGATION  1980   Family History  Problem Relation Age of Onset  .  Cancer Mother        bone  . Hypertension Father   . Mitral valve prolapse Father   . Asthma Brother   . Arthritis Brother   . Nephrolithiasis Brother   . Nephrolithiasis Brother   . Asthma Brother   . Arthritis Brother   . Aortic aneurysm Brother        ascending aortic aneuysm  . Breast cancer Maternal Aunt   . Breast cancer Maternal Aunt    Social History   Socioeconomic History  . Marital status: Married    Spouse name: Carloyn Manner  . Number of children: 0  . Years of  education: Not on file  . Highest education level: Not on file  Occupational History  . Occupation: retired Energy manager: retired  Scientific laboratory technician  . Financial resource strain: Not hard at all  . Food insecurity    Worry: Never true    Inability: Never true  . Transportation needs    Medical: No    Non-medical: No  Tobacco Use  . Smoking status: Former Research scientist (life sciences)  . Smokeless tobacco: Never Used  Substance and Sexual Activity  . Alcohol use: No    Frequency: Never  . Drug use: No  . Sexual activity: Not on file  Lifestyle  . Physical activity    Days per week: 0 days    Minutes per session: 0 min  . Stress: Not at all  Relationships  . Social Herbalist on phone: Not on file    Gets together: Not on file    Attends religious service: Not on file    Active member of club or organization: Not on file    Attends meetings of clubs or organizations: Not on file    Relationship status: Not on file  Other Topics Concern  . Not on file  Social History Narrative   Regular exercise--yes, recumbent bike 3-4 days a week      Diet: fruits and veggies, water, eats at home, drinks a lot of milk      Patient is right-handed. She drinks 1-2 cups of coffee a day.    Outpatient Encounter Medications as of 04/06/2019  Medication Sig  . apixaban (ELIQUIS) 5 MG TABS tablet Take 1 tablet (5 mg total) by mouth 2 (two) times daily.  Marland Kitchen atorvastatin (LIPITOR) 80 MG tablet TAKE 1 TABLET BY MOUTH DAILY.  . budesonide (ENTOCORT EC) 3 MG 24 hr capsule Take 6 mg by mouth daily.   . cholecalciferol (VITAMIN D) 1000 units tablet Take 1,000 Units by mouth daily.  . colesevelam (WELCHOL) 625 MG tablet TAKE 3 TABLETS BY MOUTH 2 TIMES DAILY WITH A MEAL.  Marland Kitchen gabapentin (NEURONTIN) 100 MG capsule TAKE 1 CAPSULE BY MOUTH UP TO 3 TIMES DAILY AS NEEDED.  Marland Kitchen gabapentin (NEURONTIN) 300 MG capsule TAKE 1 CAPSULE BY MOUTH 2 TIMES DAILY.  . Galcanezumab-gnlm (EMGALITY) 120 MG/ML SOSY Inject 120  mg into the skin every 30 (thirty) days.  . Insulin Glargine (LANTUS SOLOSTAR) 100 UNIT/ML Solostar Pen Inject 40 Units into the skin daily.  . Insulin Pen Needle (ULTICARE SHORT PEN NEEDLES) 31G X 8 MM MISC USE TO INJECT INSULIN DAILY  . Lancets (ONETOUCH DELICA PLUS 123XX123) MISC USE TO CHECK BLOOD SUGAR TWO TIMES DAILY  . losartan-hydrochlorothiazide (HYZAAR) 50-12.5 MG tablet Take 1 tablet by mouth daily.  Marland Kitchen omeprazole (PRILOSEC) 20 MG capsule Take 20 mg by mouth every morning.  . OnabotulinumtoxinA (BOTOX IJ)  Inject 155 Units as directed.   Glory Rosebush VERIO test strip USE TO CHECK BLOOD SUGAR TWO TIMES A DAY  . promethazine (PHENERGAN) 12.5 MG tablet Take 12.5 mg by mouth every 6 (six) hours as needed for nausea or vomiting.  . Riboflavin (VITAMIN B2 PO) Take 400 mg by mouth daily.  Marland Kitchen Ubrogepant (UBRELVY) 100 MG TABS Take 1 tablet by mouth as needed (May repeat dose after 2 hours if needed.  Maximum 2 tablets in 24 hours).   No facility-administered encounter medications on file as of 04/06/2019.     Activities of Daily Living In your present state of health, do you have any difficulty performing the following activities: 04/06/2019 03/02/2019  Hearing? N N  Vision? N N  Difficulty concentrating or making decisions? Y N  Comment some short term memory loss due to her chronic migraines -  Walking or climbing stairs? Y Y  Comment shortness of breath due to blood clots in legs and lungs -  Dressing or bathing? N N  Doing errands, shopping? N -  Preparing Food and eating ? N -  Using the Toilet? N -  In the past six months, have you accidently leaked urine? Y -  Comment wears pantyliner sometimes -  Do you have problems with loss of bowel control? N -  Managing your Medications? N -  Managing your Finances? N -  Housekeeping or managing your Housekeeping? N -  Some recent data might be hidden    Patient Care Team: [name], MD as PCP - General (Family Medicine) Pieter Partridge, DO as Consulting Physician (Neurology)    Assessment:   This is a routine wellness examination for Lenise.  Exercise Activities and Dietary recommendations Current Exercise Habits: Home exercise routine, Type of exercise: walking, Time (Minutes): 15, Frequency (Times/Week): 7, Weekly Exercise (Minutes/Week): 105, Intensity: Mild, Exercise limited by: None identified  Goals    . Patient Stated     04/06/2019, I will try to increase my exercise so I can lose some weight.        Fall Risk Fall Risk  04/06/2019 01/08/2019 06/28/2018 03/31/2018 10/12/2017  Falls in the past year? 0 0 0 No Yes  Number falls in past yr: 0 - - - 1  Injury with Fall? 0 - - - No  Risk for fall due to : Medication side effect - - - -  Follow up Falls evaluation completed;Falls prevention discussed Falls evaluation completed Falls evaluation completed - -   Is the patient's home free of loose throw rugs in walkways, pet beds, electrical cords, etc?   yes      Grab bars in the bathroom? no      Handrails on the stairs?   yes      Adequate lighting?   yes  Timed Get Up and Go performed: N/A  Depression Screen PHQ 2/9 Scores 04/06/2019 03/31/2018 05/27/2017 03/01/2017  PHQ - 2 Score 0 0 0 0  PHQ- 9 Score 0 - - -     Cognitive Function MMSE - Mini Mental State Exam 04/06/2019  Orientation to time 5  Orientation to Place 5  Registration 3  Attention/ Calculation 5  Recall 3  Language- repeat 1       Mini Cog  Mini-Cog screen was completed. Maximum score is 22. A value of 0 denotes this part of the MMSE was not completed or the patient failed this part of the Mini-Cog screening.  Immunization History  Administered Date(s) Administered  . Fluad Quad(high Dose 65+) 02/15/2019  . Influenza Split 02/15/2012  . Influenza Whole 04/24/2009, 02/26/2010  . Influenza,inj,Quad PF,6+ Mos 02/15/2013, 02/19/2014, 02/21/2015, 02/24/2016, 03/22/2017, 02/07/2018  . Pneumococcal Conjugate-13 05/18/2016  .  Pneumococcal Polysaccharide-23 02/21/2015  . Td 06/07/2006  . Tdap 02/24/2016  . Zoster 05/27/2011    Qualifies for Shingles Vaccine? Yes  Screening Tests Health Maintenance  Topic Date Due  . FOOT EXAM  06/30/2019  . OPHTHALMOLOGY EXAM  08/06/2019  . HEMOGLOBIN A1C  08/30/2019  . MAMMOGRAM  02/01/2020  . PNA vac Low Risk Adult (2 of 2 - PPSV23) 02/21/2020  . COLONOSCOPY  12/08/2022  . TETANUS/TDAP  02/23/2026  . INFLUENZA VACCINE  Completed  . DEXA SCAN  Completed  . Hepatitis C Screening  Completed    Cancer Screenings: Lung: Low Dose CT Chest recommended if Age 6-80 years, 30 pack-year currently smoking OR have quit w/in 15years. Patient does not qualify. Breast:  Up to date on Mammogram? Yes, completed 02/01/2019   Up to date of Bone Density/Dexa? Yes, completed 03/28/2018 Colorectal: completed 12/07/2012  Additional Screenings:  Hepatitis C Screening: 08/19/2015     Plan:   Patient will try to exercise more so she can lose some weight.   I have personally reviewed and noted the following in the patient's chart:   . Medical and social history . Use of alcohol, tobacco or illicit drugs  . Current medications and supplements . Functional ability and status . Nutritional status . Physical activity . Advanced directives . List of other physicians . Hospitalizations, surgeries, and ER visits in previous 12 months . Vitals . Screenings to include cognitive, depression, and falls . Referrals and appointments  In addition, I have reviewed and discussed with patient certain preventive protocols, quality metrics, and best practice recommendations. A written personalized care plan for preventive services as well as general preventive health recommendations were provided to patient.     Andrez Grime, LPN  [id number]

## 2019-04-06 NOTE — Progress Notes (Signed)
PCP notes:  Health Maintenance: Will check with pharmacy about Shingrix vaccine    Abnormal Screenings: none    Patient concerns: Wants to discuss her recent diagnosis of pulmonary embolism and DVT's     Nurse concerns: none    Next PCP appt.: 04/10/2019 @ 8:40 am

## 2019-04-06 NOTE — Patient Instructions (Signed)
Debra Leach , Thank you for taking time to come for your Medicare Wellness Visit. I appreciate your ongoing commitment to your health goals. Please review the following plan we discussed and let me know if I can assist you in the future.   Screening recommendations/referrals: Colonoscopy: up to date, completed 12/07/2012 Mammogram: up to date, completed 02/01/2019 Bone Density: up to date, completed 03/28/2018 Recommended yearly ophthalmology/optometry visit for glaucoma screening and checkup Recommended yearly dental visit for hygiene and checkup  Vaccinations: Influenza vaccine: up to date, completed 02/15/2019 Pneumococcal vaccine: Completed series Tdap vaccine: up to date, completed 02/24/2016 Shingles vaccine: will check with pharmacy    Advanced directives: Please bring a copy of your POA (Power of [place]) and/or Living Will to your next appointment.   Conditions/risks identified: diabetes, hyperlipidemia  Next appointment: 04/10/2019 @ 8:40 am    Preventive Care 65 Years and Older, Female Preventive care refers to lifestyle choices and visits with your health care provider that can promote health and wellness. What does preventive care include?  A yearly physical exam. This is also called an annual well check.  Dental exams once or twice a year.  Routine eye exams. Ask your health care provider how often you should have your eyes checked.  Personal lifestyle choices, including:  Daily care of your teeth and gums.  Regular physical activity.  Eating a healthy diet.  Avoiding tobacco and drug use.  Limiting alcohol use.  Practicing safe sex.  Taking low-dose aspirin every day.  Taking vitamin and mineral supplements as recommended by your health care provider. What happens during an annual well check? The services and screenings done by your health care provider during your annual well check will depend on your age, overall health, lifestyle risk factors, and  family history of disease. Counseling  Your health care provider may ask you questions about your:  Alcohol use.  Tobacco use.  Drug use.  Emotional well-being.  Home and relationship well-being.  Sexual activity.  Eating habits.  History of falls.  Memory and ability to understand (cognition).  Work and work Statistician.  Reproductive health. Screening  You may have the following tests or measurements:  Height, weight, and BMI.  Blood pressure.  Lipid and cholesterol levels. These may be checked every 5 years, or more frequently if you are over 35 years old.  Skin check.  Lung cancer screening. You may have this screening every year starting at age 54 if you have a 30-pack-year history of smoking and currently smoke or have quit within the past 15 years.  Fecal occult blood test (FOBT) of the stool. You may have this test every year starting at age 78.  Flexible sigmoidoscopy or colonoscopy. You may have a sigmoidoscopy every 5 years or a colonoscopy every 10 years starting at age 20.  Hepatitis C blood test.  Hepatitis B blood test.  Sexually transmitted disease (STD) testing.  Diabetes screening. This is done by checking your blood sugar (glucose) after you have not eaten for a while (fasting). You may have this done every 1-3 years.  Bone density scan. This is done to screen for osteoporosis. You may have this done starting at age 60.  Mammogram. This may be done every 1-2 years. Talk to your health care provider about how often you should have regular mammograms. Talk with your health care provider about your test results, treatment options, and if necessary, the need for more tests. Vaccines  Your health care provider may recommend certain  vaccines, such as:  Influenza vaccine. This is recommended every year.  Tetanus, diphtheria, and acellular pertussis (Tdap, Td) vaccine. You may need a Td booster every 10 years.  Zoster vaccine. You may need this  after age 56.  Pneumococcal 13-valent conjugate (PCV13) vaccine. One dose is recommended after age 22.  Pneumococcal polysaccharide (PPSV23) vaccine. One dose is recommended after age 74. Talk to your health care provider about which screenings and vaccines you need and how often you need them. This information is not intended to replace advice given to you by your health care provider. Make sure you discuss any questions you have with your health care provider. Document Released: 06/20/2015 Document Revised: 02/11/2016 Document Reviewed: 03/25/2015 Elsevier Interactive Patient Education  2017 [place] Prevention in the Home Falls can cause injuries. They can happen to people of all ages. There are many things you can do to make your home safe and to help prevent falls. What can I do on the outside of my home?  Regularly fix the edges of walkways and driveways and fix any cracks.  Remove anything that might make you trip as you walk through a door, such as a raised step or threshold.  Trim any bushes or trees on the path to your home.  Use bright outdoor lighting.  Clear any walking paths of anything that might make someone trip, such as rocks or tools.  Regularly check to see if handrails are loose or broken. Make sure that both sides of any steps have handrails.  Any raised decks and porches should have guardrails on the edges.  Have any leaves, snow, or ice cleared regularly.  Use sand or salt on walking paths during winter.  Clean up any spills in your garage right away. This includes oil or grease spills. What can I do in the bathroom?  Use night lights.  Install grab bars by the toilet and in the tub and shower. Do not use towel bars as grab bars.  Use non-skid mats or decals in the tub or shower.  If you need to sit down in the shower, use a plastic, non-slip stool.  Keep the floor dry. Clean up any water that spills on the floor as soon as it happens.   Remove soap buildup in the tub or shower regularly.  Attach bath mats securely with double-sided non-slip rug tape.  Do not have throw rugs and other things on the floor that can make you trip. What can I do in the bedroom?  Use night lights.  Make sure that you have a light by your bed that is easy to reach.  Do not use any sheets or blankets that are too big for your bed. They should not hang down onto the floor.  Have a firm chair that has side arms. You can use this for support while you get dressed.  Do not have throw rugs and other things on the floor that can make you trip. What can I do in the kitchen?  Clean up any spills right away.  Avoid walking on wet floors.  Keep items that you use a lot in easy-to-reach places.  If you need to reach something above you, use a strong step stool that has a grab bar.  Keep electrical cords out of the way.  Do not use floor polish or wax that makes floors slippery. If you must use wax, use non-skid floor wax.  Do not have throw rugs and other things  on the floor that can make you trip. What can I do with my stairs?  Do not leave any items on the stairs.  Make sure that there are handrails on both sides of the stairs and use them. Fix handrails that are broken or loose. Make sure that handrails are as long as the stairways.  Check any carpeting to make sure that it is firmly attached to the stairs. Fix any carpet that is loose or worn.  Avoid having throw rugs at the top or bottom of the stairs. If you do have throw rugs, attach them to the floor with carpet tape.  Make sure that you have a light switch at the top of the stairs and the bottom of the stairs. If you do not have them, ask someone to add them for you. What else can I do to help prevent falls?  Wear shoes that:  Do not have high heels.  Have rubber bottoms.  Are comfortable and fit you well.  Are closed at the toe. Do not wear sandals.  If you use a  stepladder:  Make sure that it is fully opened. Do not climb a closed stepladder.  Make sure that both sides of the stepladder are locked into place.  Ask someone to hold it for you, if possible.  Clearly mark and make sure that you can see:  Any grab bars or handrails.  First and last steps.  Where the edge of each step is.  Use tools that help you move around (mobility aids) if they are needed. These include:  Canes.  Walkers.  Scooters.  Crutches.  Turn on the lights when you go into a dark area. Replace any light bulbs as soon as they burn out.  Set up your furniture so you have a clear path. Avoid moving your furniture around.  If any of your floors are uneven, fix them.  If there are any pets around you, be aware of where they are.  Review your medicines with your doctor. Some medicines can make you feel dizzy. This can increase your chance of falling. Ask your doctor what other things that you can do to help prevent falls. This information is not intended to replace advice given to you by your health care provider. Make sure you discuss any questions you have with your health care provider. Document Released: 03/20/2009 Document Revised: 10/30/2015 Document Reviewed: 06/28/2014 Elsevier Interactive Patient Education  2017 [employer].

## 2019-04-09 ENCOUNTER — Ambulatory Visit: Payer: Medicare Other

## 2019-04-10 ENCOUNTER — Encounter: Payer: Medicare Other | Admitting: Family Medicine

## 2019-04-10 ENCOUNTER — Encounter: Payer: Self-pay | Admitting: Family Medicine

## 2019-04-10 ENCOUNTER — Ambulatory Visit (INDEPENDENT_AMBULATORY_CARE_PROVIDER_SITE_OTHER): Payer: Medicare Other | Admitting: Family Medicine

## 2019-04-10 ENCOUNTER — Other Ambulatory Visit: Payer: Self-pay

## 2019-04-10 VITALS — BP 130/78 | HR 116 | Temp 97.6°F | Ht 60.5 in | Wt 157.2 lb

## 2019-04-10 DIAGNOSIS — I10 Essential (primary) hypertension: Secondary | ICD-10-CM

## 2019-04-10 DIAGNOSIS — E041 Nontoxic single thyroid nodule: Secondary | ICD-10-CM

## 2019-04-10 DIAGNOSIS — I152 Hypertension secondary to endocrine disorders: Secondary | ICD-10-CM

## 2019-04-10 DIAGNOSIS — I2699 Other pulmonary embolism without acute cor pulmonale: Secondary | ICD-10-CM | POA: Diagnosis not present

## 2019-04-10 DIAGNOSIS — E1159 Type 2 diabetes mellitus with other circulatory complications: Secondary | ICD-10-CM

## 2019-04-10 DIAGNOSIS — Z0000 Encounter for general adult medical examination without abnormal findings: Secondary | ICD-10-CM | POA: Diagnosis not present

## 2019-04-10 DIAGNOSIS — K861 Other chronic pancreatitis: Secondary | ICD-10-CM

## 2019-04-10 DIAGNOSIS — E119 Type 2 diabetes mellitus without complications: Secondary | ICD-10-CM

## 2019-04-10 DIAGNOSIS — E785 Hyperlipidemia, unspecified: Secondary | ICD-10-CM

## 2019-04-10 DIAGNOSIS — E042 Nontoxic multinodular goiter: Secondary | ICD-10-CM

## 2019-04-10 DIAGNOSIS — I700 Atherosclerosis of aorta: Secondary | ICD-10-CM

## 2019-04-10 NOTE — Assessment & Plan Note (Signed)
Improving control with changes in regimen. On chronic steroids

## 2019-04-10 NOTE — Assessment & Plan Note (Signed)
No red flags on [place] 03/2019...re-eval with [place] again in 1 year.

## 2019-04-10 NOTE — Assessment & Plan Note (Signed)
Followed by GI on chronic steroids.

## 2019-04-10 NOTE — Assessment & Plan Note (Signed)
Low chol diet, continue statin.

## 2019-04-10 NOTE — Assessment & Plan Note (Signed)
Inadequate control on max dose statin.

## 2019-04-10 NOTE — Assessment & Plan Note (Signed)
Followed by onc. On active eliquis treatment.  Pending coag panel.

## 2019-04-10 NOTE — Progress Notes (Signed)
Chief Complaint  Patient presents with  . Annual Exam    Part 2    History of Present Illness: HPI  The patient presents forcomplete physical and review of chronic health problems. He/She also has the following acute concerns today:none  Health Maintenance: Will check with pharmacy about Shingrix vaccine Abnormal Screenings: none  04/10/19  PE/DVT: Followed by hemetology Dr. Tasia Catchings.  On eliquis. Pending coag panel.  No chest pain, but seems to have some increased SOB, but she is trying to do more.  Does have some occ back pain... on right where clots were.. occurring every 2-3 days. Not when exerting self. 3-6/10 on pain scale. No new swelling in legs.  Right leg pain improving.  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 Lab Results  Component Value Date   CHOL 275 (H) 01/08/2019   HDL 61.50 01/08/2019   LDLCALC 147 (H) 10/09/2018   LDLDIRECT 183.0 01/08/2019   TRIG 207.0 (H) 01/08/2019   CHOLHDL 4 01/08/2019  Using medications without problems: Muscle aches:  Diet compliance: moderate Exercise: minimal trying increase activity Other complaints:  Multiple thyroid nodules.Marland Kitchen re-eval in 1 year with repeat [place].  COVID 19 screen No recent travel or known exposure to COVID19 The patient denies respiratory symptoms of COVID 19 at this time.  The importance of social distancing was discussed today.   ROS    Past Medical History:  Diagnosis Date  . Anemia   . Arthritis    osteoarthritis B knees, left hip , right elbow  . Clotting disorder ([place])   . Complication of anesthesia    difficult waking  . Diabetes mellitus  without complication ([place])    Diet and exercise controlled  . GERD (gastroesophageal reflux disease)   . Headache   . History of kidney stones   . Hyperlipidemia   . MVP (mitral valve prolapse)    history of  . PONV (postoperative nausea and vomiting)     reports that she has quit smoking. She has never used smokeless tobacco. She reports that she does not drink alcohol or use drugs.   Current Outpatient Medications:  .  apixaban (ELIQUIS) 5 MG TABS tablet, Take 1 tablet (5 mg total) by mouth 2 (two) times daily., Disp: 180 tablet, Rfl: 1 .  atorvastatin (LIPITOR) 80 MG tablet, TAKE 1 TABLET BY MOUTH DAILY., Disp: 90 tablet, Rfl: 3 .  budesonide (ENTOCORT EC) 3 MG 24 hr capsule, Take 6 mg by mouth daily. , Disp: , Rfl:  .  cholecalciferol (VITAMIN D) 1000 units tablet, Take 1,000 Units by mouth daily., Disp: , Rfl:  .  colesevelam (WELCHOL) 625 MG tablet, TAKE 3 TABLETS BY MOUTH 2 TIMES DAILY WITH A MEAL., Disp: 540 tablet, Rfl: 3 .  gabapentin (NEURONTIN) 100 MG capsule, TAKE 1 CAPSULE BY MOUTH UP TO 3 TIMES DAILY AS NEEDED., Disp: 270 capsule, Rfl: 3 .  gabapentin (NEURONTIN) 300 MG capsule, TAKE 1 CAPSULE BY MOUTH 2 TIMES DAILY., Disp: 180 capsule, Rfl: 0 .  Galcanezumab-gnlm (EMGALITY) 120 MG/ML SOSY, Inject 120 mg into the skin every 30 (thirty) days., Disp: 1 Syringe, Rfl: 0 .  Insulin Glargine (LANTUS SOLOSTAR) 100 UNIT/ML Solostar Pen, Inject 40 Units  into the skin daily., Disp: 15 pen, Rfl: 11 .  Insulin Pen Needle (ULTICARE SHORT PEN NEEDLES) 31G X 8 MM MISC, USE TO INJECT INSULIN DAILY, Disp: 100 each, Rfl: 2 .  Lancets (ONETOUCH DELICA PLUS 123XX123) MISC, USE TO CHECK BLOOD SUGAR TWO TIMES DAILY, Disp: 100 each, Rfl: 12 .  losartan-hydrochlorothiazide (HYZAAR) 50-12.5 MG tablet, Take 1 tablet by mouth daily., Disp: 30 tablet, Rfl: 3 .  omeprazole (PRILOSEC) 20 MG capsule, Take 20 mg by mouth every morning., Disp: , Rfl:  .  OnabotulinumtoxinA (BOTOX IJ), Inject 155 Units as  directed. , Disp: , Rfl:  .  ONETOUCH VERIO test strip, USE TO CHECK BLOOD SUGAR TWO TIMES A DAY, Disp: 100 strip, Rfl: 12 .  promethazine (PHENERGAN) 12.5 MG tablet, Take 12.5 mg by mouth every 6 (six) hours as needed for nausea or vomiting., Disp: , Rfl:  .  Riboflavin (VITAMIN B2 PO), Take 400 mg by mouth daily., Disp: , Rfl:  .  Ubrogepant (UBRELVY) 100 MG TABS, Take 1 tablet by mouth as needed (May repeat dose after 2 hours if needed.  Maximum 2 tablets in 24 hours)., Disp: 10 tablet, Rfl: 3   Observations/Objective: Blood pressure 130/78, pulse (!) 116, temperature 97.6 F (36.4 C), temperature source Temporal, height 5' 0.5" (1.537 m), weight 157 lb 4 oz (71.3 kg), SpO2 98 %.  Physical Exam Constitutional:      General: She is not in acute distress.    Appearance: Normal appearance. She is well-developed. She is not ill-appearing or toxic-appearing.  HENT:     Head: Normocephalic.     Right Ear: Hearing, tympanic membrane, ear canal and external ear normal.     Left Ear: Hearing, tympanic membrane, ear canal and external ear normal.     Nose: Nose normal.  Eyes:     General: Lids are normal. Lids are everted, no foreign bodies appreciated.     Conjunctiva/sclera: Conjunctivae normal.     Pupils: Pupils are equal, round, and reactive to light.  Neck:     Musculoskeletal: Normal range of motion and neck supple.     Thyroid: No thyroid mass or thyromegaly.     Vascular: No carotid bruit.     Trachea: Trachea normal.  Cardiovascular:     Rate and Rhythm: Regular rhythm. Tachycardia present.     Heart sounds: Normal heart sounds, S1 normal and S2 normal. No murmur. No gallop.   Pulmonary:     Effort: Pulmonary effort is normal. No respiratory distress.     Breath sounds: Normal breath sounds. No wheezing, rhonchi or rales.  Abdominal:     General: Bowel sounds are normal. There is no distension or abdominal bruit.     Palpations: Abdomen is soft. There is no fluid wave or mass.      Tenderness: There is no abdominal tenderness. There is no guarding or rebound.     Hernia: No hernia is present.  Lymphadenopathy:     Cervical: No cervical adenopathy.  Skin:    General: Skin is warm and dry.     Findings: No rash.  Neurological:     Mental Status: She is alert.     Cranial Nerves: No cranial nerve deficit.     Sensory: No sensory deficit.  Psychiatric:        Mood and Affect: Mood is not anxious or depressed.        Speech: Speech normal.        Behavior: Behavior normal.  Behavior is cooperative.        Judgment: Judgment normal.      Assessment and Plan The patient's preventative maintenance and recommended screening tests for an annual wellness exam were reviewed in full today. Brought up to date unless services declined.  Counselled on the importance of diet, exercise, and its role in overall health and mortality. The patient's FH and SH was reviewed, including their home life, tobacco status, and drug and alcohol status.   Colonoscopy was normal in 2014, repeat in 10 years.  Nml endoscopyfor GI bleed in 12/2014. PAP partial hysterectomy 2001, cervix remains, last pap 2015, no further indicated. No family history of ovarian cancer. Asymptomatic.  Vaccines up-to-date Last DEXA Stable osteopenia, nml vit D. 02/2013 repeat in 5 years.. 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 On active eliquis treatment.  Pending coag panel.  Aortic atherosclerosis ([facility]) Low chol diet, continue statin.  Diabetes mellitus with no complication ([facility])  Improving control with changes in regimen. On chronic steroids  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[place]) Followed by GI on chronic steroids.  Multiple thyroid nodules No red flags on [place] 03/2019...re-eval with [place] again in 1 year.    Hyperlipidemia Inadequate control on max dose statin.    Eliezer Lofts, MD

## 2019-04-12 NOTE — Assessment & Plan Note (Signed)
Continued pain, but pt reassured it will take time to resolve.

## 2019-04-12 NOTE — Assessment & Plan Note (Signed)
Now back on home dose of insulin... follow CBGs.

## 2019-04-12 NOTE — Assessment & Plan Note (Signed)
Refer to hematology. Continue eliquis

## 2019-04-12 NOTE — Assessment & Plan Note (Signed)
Send for [place] eval.

## 2019-04-12 NOTE — Assessment & Plan Note (Signed)
Present per years per pt. Continue yearly mammograms.

## 2019-05-10 ENCOUNTER — Ambulatory Visit: Payer: Medicare Other | Admitting: Family Medicine

## 2019-05-11 ENCOUNTER — Ambulatory Visit (INDEPENDENT_AMBULATORY_CARE_PROVIDER_SITE_OTHER): Payer: Medicare Other | Admitting: Family Medicine

## 2019-05-11 ENCOUNTER — Other Ambulatory Visit: Payer: Self-pay

## 2019-05-11 ENCOUNTER — Encounter: Payer: Self-pay | Admitting: Family Medicine

## 2019-05-11 VITALS — BP 150/80 | HR 99 | Temp 97.2°F | Ht 60.5 in | Wt 159.8 lb

## 2019-05-11 DIAGNOSIS — L570 Actinic keratosis: Secondary | ICD-10-CM

## 2019-05-11 NOTE — Progress Notes (Signed)
Debra Hutt T. Ronith Berti, MD Primary Care and [place] at [facility] [place], [address] Phone: [phone]  FAX: [phone]  [name] - 68 y.o. female  MRN [id number]  Date of Birth: Aug 02, 1950  Visit Date: 05/11/2019  PCP: [name], MD  Referred by: [name], MD  Chief Complaint  Patient presents with  . Lesion on Right Leg    Dr. Diona Browner said we can freeze them off    This visit occurred during the SARS-CoV-2 public health emergency.  Safety protocols were in place, including screening questions prior to the visit, additional usage of staff PPE, and extensive cleaning of exam room while observing appropriate contact time as indicated for disinfecting solutions.   Subjective:   Debra Leach is a 68 y.o. very pleasant female patient who presents with the following:  She has 2 lesions on her right lower extremity.  These were noticed by Dr. Diona Browner on her last health maintenance exam.  She has had a history of actinic keratoses in the past which were treated with cryotherapy.  They are not itchy.  They are not painful.  2 lesions on R leg, AK x 2 Biopsy if they return Cryo x2  Past Medical History, Surgical History, Social History, Family History, Problem List, Medications, and Allergies have been reviewed and updated if relevant.  Patient Active Problem List   Diagnosis Date Noted  . Multiple thyroid nodules 03/27/2019  . DVT, lower extremity, proximal, acute, right ([place]) 03/13/2019  . 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 Leukocytosis 03/01/2019  . Breast nodule 03/01/2019  . Abnormal renal finding 06/29/2018  . 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[place]) 08/16/2017  . Eustachian tube disorder, bilateral 12/16/2016  . Esophageal dysphagia 11/22/2016  . Chronic low back pain 10/07/2016  . Vision changes 08/12/2016  . Aortic atherosclerosis ([place]) 06/11/2016  .  Supraclavicular fossa fullness 06/08/2016  . Family history of aortic aneurysm 08/20/2014  . Vitamin D deficiency 08/20/2014  . Diabetes mellitus with no complication ([place]) [id number]  . Osteopenia 11/06/2009  . Hyperlipidemia 07/05/2008  . INSOMNIA, CHRONIC 07/05/2008  . Migraine with aura 07/05/2008  . GERD 07/05/2008  . OSTEOARTHRITIS 07/05/2008  . MITRAL VALVE PROLAPSE, HX OF 07/05/2008  . NEPHROLITHIASIS, HX OF 07/05/2008    Past Medical History:  Diagnosis Date  . Anemia   . Arthritis    osteoarthritis B knees, left hip , right elbow  . Clotting disorder ([place])   . Complication of anesthesia    difficult waking  . Diabetes mellitus without complication ([place])    Diet and exercise controlled  . GERD (gastroesophageal reflux disease)   . Headache   . History of kidney stones   . Hyperlipidemia   . MVP (mitral valve prolapse)    history of  . PONV (postoperative nausea and vomiting)     Past Surgical History:  Procedure Laterality Date  . arm surgery    . BREAST BIOPSY Left   . BREAST SURGERY  2000   breast biopsy (benign)  . LITHOTRIPSY  08-12-2008   stent placed bilaterally  . PARTIAL HYSTERECTOMY     Both ovaries remain, vaginal, for mennorhagia  . TONSILLECTOMY    . TUBAL LIGATION  1980    Social History   Socioeconomic History  . Marital status: Married    Spouse name: Carloyn Manner  . Number of children: 0  . Years of education: Not on file  . Highest  education level: Not on file  Occupational History  . Occupation: retired Energy manager: retired  Scientific laboratory technician  . Financial resource strain: Not hard at all  . Food insecurity    Worry: Never true    Inability: Never true  . Transportation needs    Medical: No    Non-medical: No  Tobacco Use  . Smoking status: Former Research scientist (life sciences)  . Smokeless tobacco: Never Used  Substance and Sexual Activity  . Alcohol use: No    Frequency: Never  . Drug use: No  . Sexual activity: Not on file  Lifestyle  .  Physical activity    Days per week: 0 days    Minutes per session: 0 min  . Stress: Not at all  Relationships  . Social Herbalist on phone: Not on file    Gets together: Not on file    Attends religious service: Not on file    Active member of club or organization: Not on file    Attends meetings of clubs or organizations: Not on file    Relationship status: Not on file  . Intimate partner violence    Fear of current or ex partner: No    Emotionally abused: No    Physically abused: No    Forced sexual activity: No  Other Topics Concern  . Not on file  Social History Narrative   Regular exercise--yes, recumbent bike 3-4 days a week      Diet: fruits and veggies, water, eats at home, drinks a lot of milk      Patient is right-handed. She drinks 1-2 cups of coffee a day.    Family History  Problem Relation Age of Onset  . Cancer Mother        bone  . Hypertension Father   . Mitral valve prolapse Father   . Asthma Brother   . Arthritis Brother   . Nephrolithiasis Brother   . Nephrolithiasis Brother   . Asthma Brother   . Arthritis Brother   . Aortic aneurysm Brother        ascending aortic aneuysm  . Breast cancer Maternal Aunt   . Breast cancer Maternal Aunt     Allergies  Allergen Reactions  . Atenolol   . Codeine     REACTION: Migraine  . Penicillins Itching    Has patient had a PCN reaction causing immediate rash, facial/tongue/throat swelling, SOB or lightheadedness with hypotension: No Has patient had a PCN reaction causing severe rash involving mucus membranes or skin necrosis: No Has patient had a PCN reaction that required hospitalization: No Has patient had a PCN reaction occurring within the last 10 years: No If all of the above answers are "NO", then may proceed with Cephalosporin use.   Marland Kitchen Pentazocine Lactate     REACTION: Swelling, itching, rash  . Propranolol Other (See Comments)  . Crestor [Rosuvastatin] Other (See Comments)  .  Sudafed [Pseudoephedrine Hcl] Itching and Anxiety    Medication list reviewed and updated in full in [facility].   GEN: No acute illnesses, no fevers, chills. GI: No n/v/d, eating normally Pulm: No SOB Interactive and getting along well at home.  Otherwise, ROS is as per the HPI.  Objective:   BP (!) 150/80   Pulse 99   Temp (!) 97.2 F (36.2 C) (Temporal)   Ht 5' 0.5" (1.537 m)   Wt 159 lb 12 oz (72.5 kg)   SpO2 96%  BMI 30.69 kg/m   GEN: WDWN, NAD, Non-toxic, A & O x 3 HEENT: Atraumatic, Normocephalic. Neck supple. No masses, No LAD. Ears and Nose: No external deformity. EXTR: No c/c/e NEURO Normal gait.  PSYCH: Normally interactive. Conversant. Not depressed or anxious appearing.  Calm demeanor.   Small circular raised lesions without any pearly component.  There is some roughness to the lesions.  There is no discoloration or brown appearance.  Laboratory and Imaging Data:  Assessment and Plan:     ICD-10-CM   1. AK (actinic keratosis)  L57.0    These appear to be actinic keratosis clinically.  We will do cryotherapy x2.  If symptoms persist or they worsen at all, then I did tell her she would need to go dermatology and have these biopsied.  Cryotherapy  Reason: probable AK x 2 Location: R LE  Liquid nitrogen was applied using the liquid nitrogen gun without difficulty with an otoscope tip for concentration. Tolerated well without complications.   Follow-up: No follow-ups on file.  No orders of the defined types were placed in this encounter.  No orders of the defined types were placed in this encounter.   Signed,  Maud Deed. Diannia Hogenson, MD   Outpatient Encounter Medications as of 05/11/2019  Medication Sig  . apixaban (ELIQUIS) 5 MG TABS tablet Take 1 tablet (5 mg total) by mouth 2 (two) times daily.  Marland Kitchen atorvastatin (LIPITOR) 80 MG tablet TAKE 1 TABLET BY MOUTH DAILY.  . budesonide (ENTOCORT EC) 3 MG 24 hr capsule Take 6 mg by mouth daily.   .  cholecalciferol (VITAMIN D) 1000 units tablet Take 1,000 Units by mouth daily.  . colesevelam (WELCHOL) 625 MG tablet TAKE 3 TABLETS BY MOUTH 2 TIMES DAILY WITH A MEAL.  Marland Kitchen gabapentin (NEURONTIN) 100 MG capsule TAKE 1 CAPSULE BY MOUTH UP TO 3 TIMES DAILY AS NEEDED.  Marland Kitchen gabapentin (NEURONTIN) 300 MG capsule Take 600 mg by mouth at bedtime.  . Galcanezumab-gnlm (EMGALITY) 120 MG/ML SOSY Inject 120 mg into the skin every 30 (thirty) days.  . Insulin Glargine (LANTUS SOLOSTAR) 100 UNIT/ML Solostar Pen Inject 40 Units into the skin daily.  . Insulin Pen Needle (ULTICARE SHORT PEN NEEDLES) 31G X 8 MM MISC USE TO INJECT INSULIN DAILY  . Lancets (ONETOUCH DELICA PLUS [id number]) MISC USE TO CHECK BLOOD SUGAR TWO TIMES DAILY  . losartan-hydrochlorothiazide (HYZAAR) 50-12.5 MG tablet Take 1 tablet by mouth daily.  Marland Kitchen omeprazole (PRILOSEC) 20 MG capsule Take 20 mg by mouth every morning.  . OnabotulinumtoxinA (BOTOX IJ) Inject 155 Units as directed every 3 (three) months.   Glory Rosebush VERIO test strip USE TO CHECK BLOOD SUGAR TWO TIMES A DAY  . POTASSIUM BICARBONATE PO Take 510 mg by mouth 2 (two) times daily.  . promethazine (PHENERGAN) 12.5 MG tablet Take 12.5 mg by mouth every 6 (six) hours as needed for nausea or vomiting.  . Riboflavin (VITAMIN B2 PO) Take 400 mg by mouth daily.  Marland Kitchen Ubrogepant (UBRELVY) 100 MG TABS Take 1 tablet by mouth as needed (May repeat dose after 2 hours if needed.  Maximum 2 tablets in 24 hours).  . [DISCONTINUED] gabapentin (NEURONTIN) 300 MG capsule TAKE 1 CAPSULE BY MOUTH 2 TIMES DAILY.   No facility-administered encounter medications on file as of 05/11/2019.

## 2019-05-14 NOTE — Progress Notes (Signed)
(  Key: [id number]) Emgality 120MG /ML auto-injectors (migraine)  Wait for Determination Please wait for [employer] 2017 NCPDP to return a determination.

## 2019-05-15 NOTE — Progress Notes (Signed)
Received hard fax from [facility]   Status: approve  Pt ID# [id number]  Approved through 06/06/2020 under Medicare Part D  [phone] Ref# [id number]

## 2019-05-17 ENCOUNTER — Ambulatory Visit: Payer: Medicare Other | Admitting: Family Medicine

## 2019-05-22 NOTE — Progress Notes (Signed)
Submitted renewal for Botox   Key: [id number] - [place] Case ID: [id number] Need help? Call us at [phone] Status sent to plan today   Drug Botox 200UNIT IJ SOLR Form [employer] Medicare Part D Electronic Prior Authorization Form (2017 NCPDP)  Waiting on determination

## 2019-05-23 NOTE — Progress Notes (Signed)
Received fax from [employer] with status of BOTOX  STATU OF REQUEST: APPROVED Ref#[id number]  Approved through 08/20/2019 under Medicare Part D benefit.  [phone]  Form sent to be scan

## 2019-05-25 ENCOUNTER — Ambulatory Visit (INDEPENDENT_AMBULATORY_CARE_PROVIDER_SITE_OTHER): Payer: Medicare Other | Admitting: Neurology

## 2019-05-25 ENCOUNTER — Other Ambulatory Visit: Payer: Self-pay

## 2019-05-25 DIAGNOSIS — G43709 Chronic migraine without aura, not intractable, without status migrainosus: Secondary | ICD-10-CM | POA: Diagnosis not present

## 2019-05-25 MED ORDER — ONABOTULINUMTOXINA 100 UNITS IJ SOLR
155.0000 [IU] | Freq: Once | INTRAMUSCULAR | Status: AC
  Administered 2019-05-25: 155 [IU] via INTRAMUSCULAR

## 2019-05-25 NOTE — Progress Notes (Signed)
[facility] Clinic  ° °Procedure Note Botox ° °Attending: Dr. Adam Jaffe ° °Preoperative Diagnosis(es): Chronic migraine ° °Consent obtained from: The patient °Benefits discussed included, but were not limited to decreased muscle tightness, increased joint range of motion, and decreased pain.  Risk discussed included, but were not limited pain and discomfort, bleeding, bruising, excessive weakness, venous thrombosis, muscle atrophy and dysphagia.  Anticipated outcomes of the procedure as well as he risks and benefits of the alternatives to the procedure, and the roles and tasks of the personnel to be involved, were discussed with the patient, and the patient consents to the procedure and agrees to proceed. A copy of the patient medication guide was given to the patient which explains the blackbox warning. ° °Patients identity and treatment sites confirmed Yes.  . ° °Details of Procedure: °Skin was cleaned with alcohol. Prior to injection, the needle plunger was aspirated to make sure the needle was not within a blood vessel.  There was no blood retrieved on aspiration.   ° °Following is a summary of the muscles injected  And the amount of Botulinum toxin used: ° °Dilution °200 units of Botox was reconstituted with 4 ml of preservative free normal saline. °Time of reconstitution: At the time of the office visit (<30 minutes prior to injection)  ° °Injections  °155 total units of Botox was injected with a 30 gauge needle. ° °Injection Sites: °L occipitalis: 15 units- 3 sites  °R occiptalis: 15 units- 3 sites ° °L upper trapezius: 15 units- 3 sites °R upper trapezius: 15 units- 3 sits          °L paraspinal: 10 units- 2 sites °R paraspinal: 10 units- 2 sites ° °Face °L frontalis(2 injection sites):10 units   °R frontalis(2 injection sites):10 units         °L corrugator: 5 units   °R corrugator: 5 units           °Procerus: 5 units   °L temporalis: 20 units °R temporalis: 20 units  ° °Agent:  °200 units of botulinum Type  A (Onobotulinum Toxin type A) was reconstituted with 4 ml of preservative free normal saline.  °Time of reconstitution: At the time of the office visit (<30 minutes prior to injection)  ° ° ° Total injected (Units):  155 ° Total wasted (Units):  5 ° °Patient tolerated procedure well without complications.   °Reinjection is anticipated in 3 months. ° ° °

## 2019-05-28 ENCOUNTER — Other Ambulatory Visit: Payer: Self-pay | Admitting: Family Medicine

## 2019-05-28 DIAGNOSIS — K861 Other chronic pancreatitis: Secondary | ICD-10-CM

## 2019-05-28 NOTE — Progress Notes (Signed)
gG4, Lipase, Amylase, CBC, and CMET

## 2019-05-29 ENCOUNTER — Other Ambulatory Visit (INDEPENDENT_AMBULATORY_CARE_PROVIDER_SITE_OTHER): Payer: Medicare Other

## 2019-05-29 ENCOUNTER — Other Ambulatory Visit: Payer: Self-pay

## 2019-05-29 DIAGNOSIS — K861 Other chronic pancreatitis: Secondary | ICD-10-CM

## 2019-05-29 LAB — AMYLASE: Amylase: 16 U/L — ABNORMAL LOW (ref 27–131)

## 2019-05-29 LAB — CBC WITH DIFFERENTIAL/PLATELET
Basophils Absolute: 0.1 10*3/uL (ref 0.0–0.1)
Basophils Relative: 0.8 % (ref 0.0–3.0)
Eosinophils Absolute: 0.1 10*3/uL (ref 0.0–0.7)
Eosinophils Relative: 1.3 % (ref 0.0–5.0)
HCT: 41 % (ref 36.0–46.0)
Hemoglobin: 13.2 g/dL (ref 12.0–15.0)
Lymphocytes Relative: 15 % (ref 12.0–46.0)
Lymphs Abs: 1.7 10*3/uL (ref 0.7–4.0)
MCHC: 32.3 g/dL (ref 30.0–36.0)
MCV: 92.7 fl (ref 78.0–100.0)
Monocytes Absolute: 0.9 10*3/uL (ref 0.1–1.0)
Monocytes Relative: 7.8 % (ref 3.0–12.0)
Neutro Abs: 8.4 10*3/uL — ABNORMAL HIGH (ref 1.4–7.7)
Neutrophils Relative %: 75.1 % (ref 43.0–77.0)
Platelets: 348 10*3/uL (ref 150.0–400.0)
RBC: 4.43 Mil/uL (ref 3.87–5.11)
RDW: 14.4 % (ref 11.5–15.5)
WBC: 11.2 10*3/uL — ABNORMAL HIGH (ref 4.0–10.5)

## 2019-05-29 LAB — COMPREHENSIVE METABOLIC PANEL
ALT: 30 U/L (ref 0–35)
AST: 22 U/L (ref 0–37)
Albumin: 3.6 g/dL (ref 3.5–5.2)
Alkaline Phosphatase: 79 U/L (ref 39–117)
BUN: 18 mg/dL (ref 6–23)
CO2: 31 mEq/L (ref 19–32)
Calcium: 9.2 mg/dL (ref 8.4–10.5)
Chloride: 100 mEq/L (ref 96–112)
Creatinine, Ser: 1.08 mg/dL (ref 0.40–1.20)
GFR: 50.44 mL/min — ABNORMAL LOW (ref >60.00)
Glucose, Bld: 250 mg/dL — ABNORMAL HIGH (ref 70–99)
Potassium: 4.4 mEq/L (ref 3.5–5.1)
Sodium: 139 mEq/L (ref 135–145)
Total Bilirubin: 0.4 mg/dL (ref 0.2–1.2)
Total Protein: 6.3 g/dL (ref 6.0–8.3)

## 2019-05-29 LAB — LIPASE: Lipase: 10 U/L — ABNORMAL LOW (ref 11.0–59.0)

## 2019-06-04 LAB — ALLERGEN, CORN, IGG4: Allergen, Corn, IgG4: 0.16 ug/mL

## 2019-06-08 HISTORY — PX: PANCREAS BIOPSY: SHX1018

## 2019-06-11 ENCOUNTER — Ambulatory Visit: Payer: Medicare Other | Attending: Internal Medicine

## 2019-06-11 DIAGNOSIS — Z20822 Contact with and (suspected) exposure to covid-19: Secondary | ICD-10-CM

## 2019-06-12 ENCOUNTER — Encounter: Payer: Self-pay | Admitting: *Deleted

## 2019-06-12 NOTE — Progress Notes (Addendum)
Debra Leach (Key: [id number] 7MG 7) Ubrelvy 100MG  tablets   Form [employer] Medicare Part D Electronic Prior Authorization Form (2017 NCPDP) Created 3 days ago Sent to Plan 1 hour ago Plan Response 1 hour ago Submit Clinical Questions 38 minutes ago Determination Favorable 23 minutes ago Message from Plan Request Reference Number: [id number]. UBRELVY TAB 100MG  is approved through 06/06/2020. For further questions, call [phone].

## 2019-06-13 LAB — NOVEL CORONAVIRUS, NAA: SARS-CoV-2, NAA: NOT DETECTED

## 2019-06-18 ENCOUNTER — Other Ambulatory Visit: Payer: Self-pay

## 2019-06-19 ENCOUNTER — Other Ambulatory Visit: Payer: Self-pay | Admitting: Neurology

## 2019-06-19 ENCOUNTER — Inpatient Hospital Stay: Payer: Medicare Other | Attending: Oncology

## 2019-06-19 ENCOUNTER — Other Ambulatory Visit: Payer: Self-pay

## 2019-06-19 DIAGNOSIS — Z87891 Personal history of nicotine dependence: Secondary | ICD-10-CM | POA: Insufficient documentation

## 2019-06-19 DIAGNOSIS — I2699 Other pulmonary embolism without acute cor pulmonale: Secondary | ICD-10-CM

## 2019-06-19 DIAGNOSIS — Z7901 Long term (current) use of anticoagulants: Secondary | ICD-10-CM | POA: Insufficient documentation

## 2019-06-19 DIAGNOSIS — R0609 Other forms of dyspnea: Secondary | ICD-10-CM | POA: Diagnosis not present

## 2019-06-19 DIAGNOSIS — Z86711 Personal history of pulmonary embolism: Secondary | ICD-10-CM | POA: Diagnosis not present

## 2019-06-19 DIAGNOSIS — Z8261 Family history of arthritis: Secondary | ICD-10-CM | POA: Diagnosis not present

## 2019-06-19 DIAGNOSIS — Z803 Family history of malignant neoplasm of breast: Secondary | ICD-10-CM | POA: Diagnosis not present

## 2019-06-19 DIAGNOSIS — Z808 Family history of malignant neoplasm of other organs or systems: Secondary | ICD-10-CM | POA: Diagnosis not present

## 2019-06-19 DIAGNOSIS — Z86718 Personal history of other venous thrombosis and embolism: Secondary | ICD-10-CM | POA: Diagnosis present

## 2019-06-19 DIAGNOSIS — Z8249 Family history of ischemic heart disease and other diseases of the circulatory system: Secondary | ICD-10-CM | POA: Insufficient documentation

## 2019-06-19 LAB — CBC WITH DIFFERENTIAL/PLATELET
Abs Immature Granulocytes: 0.07 10*3/uL (ref 0.00–0.07)
Basophils Absolute: 0.1 10*3/uL (ref 0.0–0.1)
Basophils Relative: 1 %
Eosinophils Absolute: 0.2 10*3/uL (ref 0.0–0.5)
Eosinophils Relative: 1 %
HCT: 42.7 % (ref 36.0–46.0)
Hemoglobin: 13.4 g/dL (ref 12.0–15.0)
Immature Granulocytes: 1 %
Lymphocytes Relative: 10 %
Lymphs Abs: 1.4 10*3/uL (ref 0.7–4.0)
MCH: 28.8 pg (ref 26.0–34.0)
MCHC: 31.4 g/dL (ref 30.0–36.0)
MCV: 91.6 fL (ref 80.0–100.0)
Monocytes Absolute: 0.8 10*3/uL (ref 0.1–1.0)
Monocytes Relative: 6 %
Neutro Abs: 10.6 10*3/uL — ABNORMAL HIGH (ref 1.7–7.7)
Neutrophils Relative %: 81 %
Platelets: 372 10*3/uL (ref 150–400)
RBC: 4.66 MIL/uL (ref 3.87–5.11)
RDW: 13.2 % (ref 11.5–15.5)
WBC: 13 10*3/uL — ABNORMAL HIGH (ref 4.0–10.5)
nRBC: 0 % (ref 0.0–0.2)

## 2019-06-19 LAB — COMPREHENSIVE METABOLIC PANEL
ALT: 30 U/L (ref 0–44)
AST: 24 U/L (ref 15–41)
Albumin: 3.3 g/dL — ABNORMAL LOW (ref 3.5–5.0)
Alkaline Phosphatase: 74 U/L (ref 38–126)
Anion gap: 12 (ref 5–15)
BUN: 17 mg/dL (ref 8–23)
CO2: 26 mmol/L (ref 22–32)
Calcium: 8.8 mg/dL — ABNORMAL LOW (ref 8.9–10.3)
Chloride: 102 mmol/L (ref 98–111)
Creatinine, Ser: 1.08 mg/dL — ABNORMAL HIGH (ref 0.44–1.00)
GFR calc Af Amer: >60 mL/min (ref >60)
GFR calc non Af Amer: 53 mL/min — ABNORMAL LOW (ref >60)
Glucose, Bld: 160 mg/dL — ABNORMAL HIGH (ref 70–99)
Potassium: 3.7 mmol/L (ref 3.5–5.1)
Sodium: 140 mmol/L (ref 135–145)
Total Bilirubin: 0.4 mg/dL (ref 0.3–1.2)
Total Protein: 7.4 g/dL (ref 6.5–8.1)

## 2019-06-20 ENCOUNTER — Other Ambulatory Visit: Payer: Medicare Other

## 2019-06-20 ENCOUNTER — Encounter: Payer: Self-pay | Admitting: Oncology

## 2019-06-20 ENCOUNTER — Inpatient Hospital Stay (HOSPITAL_BASED_OUTPATIENT_CLINIC_OR_DEPARTMENT_OTHER): Payer: Medicare Other | Admitting: Oncology

## 2019-06-20 DIAGNOSIS — Z79899 Other long term (current) drug therapy: Secondary | ICD-10-CM

## 2019-06-20 DIAGNOSIS — Z803 Family history of malignant neoplasm of breast: Secondary | ICD-10-CM

## 2019-06-20 DIAGNOSIS — I10 Essential (primary) hypertension: Secondary | ICD-10-CM | POA: Diagnosis not present

## 2019-06-20 DIAGNOSIS — R0609 Other forms of dyspnea: Secondary | ICD-10-CM

## 2019-06-20 DIAGNOSIS — R0600 Dyspnea, unspecified: Secondary | ICD-10-CM

## 2019-06-20 DIAGNOSIS — Z86711 Personal history of pulmonary embolism: Secondary | ICD-10-CM

## 2019-06-20 DIAGNOSIS — E119 Type 2 diabetes mellitus without complications: Secondary | ICD-10-CM

## 2019-06-20 DIAGNOSIS — Z86718 Personal history of other venous thrombosis and embolism: Secondary | ICD-10-CM

## 2019-06-20 DIAGNOSIS — Z7901 Long term (current) use of anticoagulants: Secondary | ICD-10-CM

## 2019-06-20 DIAGNOSIS — E785 Hyperlipidemia, unspecified: Secondary | ICD-10-CM

## 2019-06-20 DIAGNOSIS — Z794 Long term (current) use of insulin: Secondary | ICD-10-CM

## 2019-06-20 DIAGNOSIS — Z8249 Family history of ischemic heart disease and other diseases of the circulatory system: Secondary | ICD-10-CM

## 2019-06-20 NOTE — Progress Notes (Signed)
HEMATOLOGY-ONCOLOGY TeleHEALTH VISIT PROGRESS NOTE  I connected with Debra Leach on 06/20/19 at 10:15 AM EST by video enabled telemedicine visit and verified that I am speaking with the correct person using two identifiers. I discussed the limitations, risks, security and privacy concerns of performing an evaluation and management service by telemedicine and the availability of in-person appointments. I also discussed with the patient that there may be a patient responsible charge related to this service. The patient expressed understanding and agreed to proceed.   Other persons participating in the visit and their role in the encounter:  None  Patient's location: Home  Provider's location: office Chief Complaint: Follow-up for pulmonary embolism/DVT   INTERVAL HISTORY Debra Leach is a 69 y.o. female who has above history reviewed by me today presents for follow up visit for pulmonary embolism and DVT Problems and complaints are listed below:  Patient has been on Eliquis 5 mg twice daily for 4 months.  She reports that her shortness of breath has improved however she still has shortness of breath with exertion, especially after walking up 1 flight of stairs. She also mentioned that recently she started to have " pain in my lungs", similar to the pain that she had when she had PE.  The pain is intermittent, sharp in nature, sometimes last 3 hours, no exacerbating or alleviating factors.  Spontaneously resolved. Lower extremity no edema.  Review of Systems  Constitutional: Negative for appetite change, chills, fatigue and fever.  HENT:   Negative for hearing loss and voice change.   Eyes: Negative for eye problems.  Respiratory: Positive for shortness of breath. Negative for chest tightness and cough.   Cardiovascular: Negative for chest pain.       "lung pain"  Gastrointestinal: Negative for abdominal distention, abdominal pain and blood in stool.  Endocrine: Negative  for hot flashes.  Genitourinary: Negative for difficulty urinating and frequency.   Musculoskeletal: Negative for arthralgias.  Skin: Negative for itching and rash.  Neurological: Negative for extremity weakness.  Hematological: Negative for adenopathy.  Psychiatric/Behavioral: Negative for confusion.    Past Medical History:  Diagnosis Date  . Anemia   . Arthritis    osteoarthritis B knees, left hip , right elbow  . Clotting disorder ([place])   . Complication of anesthesia    difficult waking  . Diabetes mellitus without complication ([place])    Diet and exercise controlled  . GERD (gastroesophageal reflux disease)   . Headache   . History of kidney stones   . Hyperlipidemia   . MVP (mitral valve prolapse)    history of  . PONV (postoperative nausea and vomiting)    Past Surgical History:  Procedure Laterality Date  . arm surgery    . BREAST BIOPSY Left   . BREAST SURGERY  2000   breast biopsy (benign)  . LITHOTRIPSY  08-12-2008   stent placed bilaterally  . PARTIAL HYSTERECTOMY     Both ovaries remain, vaginal, for mennorhagia  . TONSILLECTOMY    . TUBAL LIGATION  1980    Family History  Problem Relation Age of Onset  . Cancer Mother        bone  . Hypertension Father   . Mitral valve prolapse Father   . Asthma Brother   . Arthritis Brother   . Nephrolithiasis Brother   . Nephrolithiasis Brother   . Asthma Brother   . Arthritis Brother   . Aortic aneurysm Brother        ascending  aortic aneuysm  . Breast cancer Maternal Aunt   . Breast cancer Maternal Aunt     Social History   Socioeconomic History  . Marital status: Married    Spouse name: Carloyn Manner  . Number of children: 0  . Years of education: Not on file  . Highest education level: Not on file  Occupational History  . Occupation: retired Energy manager: retired  Tobacco Use  . Smoking status: Former Research scientist (life sciences)  . Smokeless tobacco: Never Used  Substance and Sexual Activity  . Alcohol use: No   . Drug use: No  . Sexual activity: Not on file  Other Topics Concern  . Not on file  Social History Narrative   Regular exercise--yes, recumbent bike 3-4 days a week      Diet: fruits and veggies, water, eats at home, drinks a lot of milk      Patient is right-handed. She drinks 1-2 cups of coffee a day.   Social Determinants of Health   Financial Resource Strain: Low Risk   . Difficulty of Paying Living Expenses: Not hard at all  Food Insecurity: No Food Insecurity  . Worried About Charity fundraiser in the Last Year: Never true  . Ran Out of Food in the Last Year: Never true  Transportation Needs: No Transportation Needs  . Lack of Transportation (Medical): No  . Lack of Transportation (Non-Medical): No  Physical Activity: Inactive  . Days of Exercise per Week: 0 days  . Minutes of Exercise per Session: 0 min  Stress: No Stress Concern Present  . Feeling of Stress : Not at all  Social Connections:   . Frequency of Communication with Friends and Family: Not on file  . Frequency of Social Gatherings with Friends and Family: Not on file  . Attends Religious Services: Not on file  . Active Member of Clubs or Organizations: Not on file  . Attends Archivist Meetings: Not on file  . Marital Status: Not on file  Intimate Partner Violence: Not At Risk  . Fear of Current or Ex-Partner: No  . Emotionally Abused: No  . Physically Abused: No  . Sexually Abused: No    Current Outpatient Medications on File Prior to Visit  Medication Sig Dispense Refill  . apixaban (ELIQUIS) 5 MG TABS tablet Take 1 tablet (5 mg total) by mouth 2 (two) times daily. 180 tablet 1  . atorvastatin (LIPITOR) 80 MG tablet TAKE 1 TABLET BY MOUTH DAILY. 90 tablet 3  . budesonide (ENTOCORT EC) 3 MG 24 hr capsule Take 6 mg by mouth daily.     . cholecalciferol (VITAMIN D) 1000 units tablet Take 1,000 Units by mouth daily.    . colesevelam (WELCHOL) 625 MG tablet TAKE 3 TABLETS BY MOUTH 2 TIMES  DAILY WITH A MEAL. 540 tablet 3  . gabapentin (NEURONTIN) 100 MG capsule TAKE 1 CAPSULE BY MOUTH UP TO 3 TIMES DAILY AS NEEDED. 270 capsule 3  . gabapentin (NEURONTIN) 300 MG capsule TAKE 1 CAPSULE BY MOUTH 2 TIMES DAILY. 180 capsule 0  . Galcanezumab-gnlm (EMGALITY) 120 MG/ML SOSY Inject 120 mg into the skin every 30 (thirty) days. 1 Syringe 0  . Insulin Glargine (LANTUS SOLOSTAR) 100 UNIT/ML Solostar Pen Inject 40 Units into the skin daily. 15 pen 11  . Insulin Pen Needle (ULTICARE SHORT PEN NEEDLES) 31G X 8 MM MISC USE TO INJECT INSULIN DAILY 100 each 2  . Lancets (ONETOUCH DELICA PLUS 123XX123) MISC USE TO  CHECK BLOOD SUGAR TWO TIMES DAILY 100 each 12  . losartan-hydrochlorothiazide (HYZAAR) 50-12.5 MG tablet Take 1 tablet by mouth daily. 30 tablet 3  . omeprazole (PRILOSEC) 20 MG capsule Take 20 mg by mouth every morning.    . OnabotulinumtoxinA (BOTOX IJ) Inject 155 Units as directed every 3 (three) months.     Glory Rosebush VERIO test strip USE TO CHECK BLOOD SUGAR TWO TIMES A DAY 100 strip 12  . POTASSIUM BICARBONATE PO Take 510 mg by mouth 2 (two) times daily.    . promethazine (PHENERGAN) 12.5 MG tablet Take 12.5 mg by mouth every 6 (six) hours as needed for nausea or vomiting.    . Riboflavin (VITAMIN B2 PO) Take 400 mg by mouth daily.    Marland Kitchen Ubrogepant (UBRELVY) 100 MG TABS Take 1 tablet by mouth as needed (May repeat dose after 2 hours if needed.  Maximum 2 tablets in 24 hours). 10 tablet 3   No current facility-administered medications on file prior to visit.    Allergies  Allergen Reactions  . Atenolol   . Codeine     REACTION: Migraine  . Penicillins Itching    Has patient had a PCN reaction causing immediate rash, facial/tongue/throat swelling, SOB or lightheadedness with hypotension: No Has patient had a PCN reaction causing severe rash involving mucus membranes or skin necrosis: No Has patient had a PCN reaction that required hospitalization: No Has patient had a PCN  reaction occurring within the last 10 years: No If all of the above answers are "NO", then may proceed with Cephalosporin use.   Marland Kitchen Pentazocine Lactate     REACTION: Swelling, itching, rash  . Propranolol Other (See Comments)  . Crestor [Rosuvastatin] Other (See Comments)  . Sudafed [Pseudoephedrine Hcl] Itching and Anxiety       Observations/Objective: Today's Vitals   06/20/19 1006  PainSc: 0-No pain   There is no height or weight on file to calculate BMI.  Physical Exam  Constitutional: No distress.  Neurological: She is alert.  Psychiatric: Mood normal.    CBC    Component Value Date/Time   WBC 13.0 (H) 06/19/2019 1134   RBC 4.66 06/19/2019 1134   HGB 13.4 06/19/2019 1134   HCT 42.7 06/19/2019 1134   PLT 372 06/19/2019 1134   MCV 91.6 06/19/2019 1134   MCH 28.8 06/19/2019 1134   MCHC 31.4 06/19/2019 1134   RDW 13.2 06/19/2019 1134   LYMPHSABS 1.4 06/19/2019 1134   MONOABS 0.8 06/19/2019 1134   EOSABS 0.2 06/19/2019 1134   BASOSABS 0.1 06/19/2019 1134    CMP     Component Value Date/Time   NA 140 06/19/2019 1134   K 3.7 06/19/2019 1134   CL 102 06/19/2019 1134   CO2 26 06/19/2019 1134   GLUCOSE 160 (H) 06/19/2019 1134   BUN 17 06/19/2019 1134   CREATININE 1.08 (H) 06/19/2019 1134   CREATININE 0.84 06/08/2016 1032   CALCIUM 8.8 (L) 06/19/2019 1134   PROT 7.4 06/19/2019 1134   ALBUMIN 3.3 (L) 06/19/2019 1134   AST 24 06/19/2019 1134   ALT 30 06/19/2019 1134   ALKPHOS 74 06/19/2019 1134   BILITOT 0.4 06/19/2019 1134   GFRNONAA 53 (L) 06/19/2019 1134   GFRAA >60 06/19/2019 1134     Assessment and Plan: 1. History of pulmonary embolism   2. History of DVT of lower extremity   3. Dyspnea on exertion     Labs reviewed and discussed with patient.  Counts are stable. History  of pulmonary embolism, unprovoked, as well as lower extremity DVT, Continue Eliquis 5 mg twice daily.  She tolerates well. Given that she continues to have shortness of breath with  exertion, as well as " lung pain", I would obtain CT chest angiogram for further evaluation.  Factor V Leiden mutation and prothrombin mutation testing with pending.  Follow Up Instructions: March 2021  Orders Placed This Encounter  Procedures  . CT ANGIO CHEST PE W OR WO CONTRAST    Standing Status:   Future    Standing Expiration Date:   09/17/2020    Order Specific Question:   ** REASON FOR EXAM (FREE TEXT)    Answer:   4 months on anticoagulation, still some chest pain, sob w exertion    Order Specific Question:   If indicated for the ordered procedure, I authorize the administration of contrast media per Radiology protocol    Answer:   Yes    Order Specific Question:   Preferred imaging location?    Answer:   [facility]    Order Specific Question:   Radiology Contrast Protocol - do NOT remove file path    Answer:   [email or web address]  . CBC with Differential    Standing Status:   Future    Standing Expiration Date:   06/19/2020  . Comprehensive metabolic panel    Standing Status:   Future    Standing Expiration Date:   06/19/2020  . Fibrin derivatives [facility])    Standing Status:   Future    Standing Expiration Date:   06/19/2020     I discussed the assessment and treatment plan with the patient. The patient was provided an opportunity to ask questions and all were answered. The patient agreed with the plan and demonstrated an understanding of the instructions.  The patient was advised to call back or seek an in-person evaluation if the symptoms worsen or if the condition fails to improve as anticipated.  Earlie Server, MD 06/20/2019 10:55 PM

## 2019-06-20 NOTE — Progress Notes (Signed)
Patient verified using two identifiers for virtual visit via telephone today.  Patient has episodes of pain in lungs.

## 2019-06-21 LAB — FACTOR 5 LEIDEN

## 2019-06-22 ENCOUNTER — Other Ambulatory Visit: Payer: Self-pay

## 2019-06-22 ENCOUNTER — Ambulatory Visit
Admission: RE | Admit: 2019-06-22 | Discharge: 2019-06-22 | Disposition: A | Discharge status: Home / Self Care | Payer: Medicare Other | Source: Ambulatory Visit | Attending: Oncology | Admitting: Oncology

## 2019-06-22 ENCOUNTER — Encounter: Payer: Self-pay | Admitting: Oncology

## 2019-06-22 DIAGNOSIS — Z86718 Personal history of other venous thrombosis and embolism: Secondary | ICD-10-CM | POA: Insufficient documentation

## 2019-06-22 NOTE — Telephone Encounter (Signed)
Done.Marland Kitchen...  STAT US has been scheduled as requested. I will call to make pt aware of the location and appt time.

## 2019-06-25 LAB — PROTHROMBIN GENE MUTATION

## 2019-07-02 ENCOUNTER — Other Ambulatory Visit: Payer: Self-pay

## 2019-07-02 ENCOUNTER — Ambulatory Visit
Admission: RE | Admit: 2019-07-02 | Discharge: 2019-07-02 | Disposition: A | Discharge status: Home / Self Care | Payer: Medicare Other | Source: Ambulatory Visit | Attending: Oncology | Admitting: Oncology

## 2019-07-02 DIAGNOSIS — Z86711 Personal history of pulmonary embolism: Secondary | ICD-10-CM | POA: Insufficient documentation

## 2019-07-02 MED ORDER — IOHEXOL 350 MG/ML SOLN
75.0000 mL | Freq: Once | INTRAVENOUS | Status: AC | PRN
  Administered 2019-07-02: 75 mL via INTRAVENOUS

## 2019-07-04 ENCOUNTER — Telehealth: Payer: Self-pay | Admitting: Family Medicine

## 2019-07-04 ENCOUNTER — Encounter: Payer: Self-pay | Admitting: Oncology

## 2019-07-04 ENCOUNTER — Other Ambulatory Visit: Payer: Self-pay | Admitting: Family Medicine

## 2019-07-04 NOTE — Telephone Encounter (Signed)
Pt called to schedule an appointment with Dr. Diona Browner to discuss having trouble breathing and stuttering. I advised her I would need to have her triaged prior to scheduling an appointment. During the process of getting her over to a nurse, she hung up. I attempted to reach her to get her over to a nurse and she didn't answer. I left a voicemail asking her to call office.

## 2019-07-05 ENCOUNTER — Ambulatory Visit (INDEPENDENT_AMBULATORY_CARE_PROVIDER_SITE_OTHER): Payer: Medicare Other | Admitting: Family Medicine

## 2019-07-05 ENCOUNTER — Encounter: Payer: Self-pay | Admitting: Family Medicine

## 2019-07-05 ENCOUNTER — Telehealth: Payer: Self-pay

## 2019-07-05 ENCOUNTER — Other Ambulatory Visit: Payer: Self-pay

## 2019-07-05 VITALS — BP 138/80 | HR 119 | Temp 97.9°F | Ht 60.5 in | Wt 158.5 lb

## 2019-07-05 DIAGNOSIS — R0602 Shortness of breath: Secondary | ICD-10-CM | POA: Diagnosis not present

## 2019-07-05 DIAGNOSIS — R0600 Dyspnea, unspecified: Secondary | ICD-10-CM | POA: Diagnosis not present

## 2019-07-05 DIAGNOSIS — E1159 Type 2 diabetes mellitus with other circulatory complications: Secondary | ICD-10-CM

## 2019-07-05 DIAGNOSIS — I152 Hypertension secondary to endocrine disorders: Secondary | ICD-10-CM

## 2019-07-05 DIAGNOSIS — K861 Other chronic pancreatitis: Secondary | ICD-10-CM

## 2019-07-05 DIAGNOSIS — I10 Essential (primary) hypertension: Secondary | ICD-10-CM

## 2019-07-05 DIAGNOSIS — Z86711 Personal history of pulmonary embolism: Secondary | ICD-10-CM

## 2019-07-05 DIAGNOSIS — R0609 Other forms of dyspnea: Secondary | ICD-10-CM

## 2019-07-05 DIAGNOSIS — R000 Tachycardia, unspecified: Secondary | ICD-10-CM

## 2019-07-05 DIAGNOSIS — D473 Essential (hemorrhagic) thrombocythemia: Secondary | ICD-10-CM

## 2019-07-05 DIAGNOSIS — I2699 Other pulmonary embolism without acute cor pulmonale: Secondary | ICD-10-CM

## 2019-07-05 NOTE — Telephone Encounter (Signed)
Pt already has appt with Dr Lorelei Pont 07/05/19 at 11:40. I spoke with pt and she said she is still having SOB upon exertion but when sitting still no SOB. Pt is not having CP or any new symptoms. Pt is aware any new symptoms or SOB worsens pt will go to ED otherwise pt plans on keeping appt with Dr Lorelei Pont today. FYI to Dr Lorelei Pont.

## 2019-07-05 NOTE — Telephone Encounter (Signed)
[place] [facility] - Client TELEPHONE ADVICE RECORD [facility] Patient Name: Debra Leach Gender: Female DOB: February 03, 1951 Age: 69 Y 35 M 30 D Return Phone Number: [phone] (Primary), [phone] (Secondary) Address: [place] [address] Client [facility] - Client Client Site [place] - Day Physician Eliezer Lofts - MD Contact Type Call Who Is Calling Patient / Member / Family / Caregiver Call Type Triage / Clinical Relationship To Patient Self Return Phone Number [phone] (Secondary) Chief Complaint BREATHING - shortness of breath or sounds breathless Reason for Call Symptomatic / Request for [place] states difficulty breathing. Translation No Nurse Assessment Nurse: Ottis Stain, RN, Sherrie Date/Time Eilene Ghazi Time): 07/04/2019 4:29:25 PM Confirm and document reason for call. If symptomatic, describe symptoms. ---Caller states had blood clots in lungs and leg in Nov. States gets winded very easy. Blood clots in lungs are gone per CT on Tuesday. In the last 2-3 weeks states breathing has gotten worse. Also having weakness in thighs. No fever, No cough, No sinus congestion. Has the patient had close contact with a person known or suspected to have the novel coronavirus illness OR traveled / lives in area with major community spread (including international travel) in the last 14 days from the onset of symptoms? * If Asymptomatic, screen for exposure and travel within the last 14 days. ---No Does the patient have any new or worsening symptoms? ---Yes Will a triage be completed? ---Yes Related visit to physician within the last 2 weeks? ---N/A Does the PT have any chronic conditions? (i.e. diabetes, asthma, this includes High risk factors for pregnancy, etc.) ---Yes List chronic conditions. ---+Diabetes-insulin, Migraines, Auto-immune with  pancreatitis, High Cholesterol. Is this a behavioral health or substance abuse call? ---No Guidelines Guideline Title Affirmed Question Affirmed Notes Nurse Date/Time (Eastern Time) Breathing Difficulty [1] MILD difficulty breathing (e.g., minimal/ no SOB at rest, SOB with walking, pulse <100) Ottis Stain, RN, Sherrie 07/04/2019 4:40:56 PM PLEASE NOTE: All timestamps contained within this report are represented as [place] Standard Time. CONFIDENTIALTY NOTICE: This fax transmission is intended only for the addressee. It contains information that is legally privileged, confidential or otherwise protected from use or disclosure. If you are not the intended recipient, you are strictly prohibited from reviewing, disclosing, copying using or disseminating any of this information or taking any action in reliance on or regarding this information. If you have received this fax in error, please notify us immediately by telephone so that we can arrange for its return to [place]. Phone: [phone], Toll-Free: [phone], Fax: [phone] Page: 2 of 2 Call Id: [id number] Guidelines Guideline Title Affirmed Question Affirmed Notes Nurse Date/Time Eilene Ghazi Time) AND [2] NEW-onset or WORSE than normal Disp. Time Eilene Ghazi Time) Disposition Final User 07/04/2019 4:28:28 PM Send to Urgent Theron Arista, Edden 07/04/2019 4:52:35 PM See HCP within 4 Hours (or PCP triage) Yes Ottis Stain, RN, Sherrie Caller Disagree/Comply Disagree Caller Understands Yes PreDisposition InappropriateToAsk Care Advice Given Per Guideline SEE HCP WITHIN 4 HOURS (OR PCP TRIAGE): * IF OFFICE WILL BE CLOSED AND NO PCP (PRIMARY CARE PROVIDER) SECOND-LEVEL TRIAGE: You need to be seen within the next 3 or 4 hours. A nearby Urgent Care Center [facility]) is often a good source of care. Another choice is to go to the ED. Go sooner if you become worse. CALL BACK IF: * You become worse. Comments User: Evlyn Clines, RN Date/Time Eilene Ghazi Time):  07/04/2019 4:52:31 PM Called backline and no appt available  tonight per Larene [place]. Explained to caller that she would need to be seen in the ED/UC in the next 3-4 hours. Says would rather go to the office tomorrow. Again explained that I think she needs to be in the next 3-4 hours. States if gets worse will go into the ED, otherwise will call PCP in the am for appointment. Referrals GO TO FACILITY REFUSED

## 2019-07-05 NOTE — Progress Notes (Signed)
Debra Wyble T. Alinah Sheard, MD Primary Care and [facility] at [facility] [place], [address] Phone: [phone]  FAX: [phone]  Debra Leach - 69 y.o. female  MRN [id number]  Date of Birth: 1951/05/26  Visit Date: 07/05/2019  PCP: [name], MD  Referred by: [name], MD  Chief Complaint  Patient presents with  . Shortness of Breath    Gotten worse over the last 2 weeks  . Extremity Weakness    Legs    This visit occurred during the SARS-CoV-2 public health emergency.  Safety protocols were in place, including screening questions prior to the visit, additional usage of staff PPE, and extensive cleaning of exam room while observing appropriate contact time as indicated for disinfecting solutions.   Subjective:   Debra Leach is a 69 y.o. very pleasant female patient who presents with the following:  She is a pleasant lady, she presents with some ongoing shortness of breath.  History is significant for a pulmonary embolism in November 2020.  She is currently on Eliquis. Neg CT angio and [place] on 07/02/2019 She also had a Doppler for ultrasound and had a evidence of no DVT.  Had been doing well, and a couple of weeks ago and had some shortness of breath and some weakness in her legs.  She had been doing quite well, but up to 2 or 3 weeks ago she had had a distinct decline in service and she is having dyspnea on exertion now.  At baseline and rest she is not having any dyspnea.  She also feels as if she has some subjective weakness in both of her legs.  Had some pain in her lungs as well.  Had a normal visit with her onc and had some eval with them.    ? Pulmonary HTN.  She was told by her oncologist that she could potentially have some pulmonary hypertension.  Smoked for 13-14 years / off for 27 years.   Cardiac risk factors include: Aortic atherosclerosis.  Diabetes.  Hyperlipidemia.  Mitral  valve prolapse.  Hypertension. Per her report her father had some issues with coronary disease as well, but I do not see this in the chart.  She did have a nuclear stress test in April 2016 and this was of low risk study.  She also had an echocardiogram in September 2020.  She had some mild LVH on her echo.  She also had some impaired diastolic dysfunction.  On this echo there is no evidence of mitral valve regurgitation.  Mild aortic valve sclerosis.  Past Medical History, Surgical History, Social History, Family History, Problem List, Medications, and Allergies have been reviewed and updated if relevant.   GEN: No acute illnesses, no fevers, chills. GI: No n/v/d, eating normally Pulm: Shortness of breath with exertion interactive and getting along well at home.  Otherwise, ROS is as per the HPI.  Objective:   BP 138/80   Pulse (!) 119   Temp 97.9 F (36.6 C) (Temporal)   Ht 5' 0.5" (1.537 m)   Wt 158 lb 8 oz (71.9 kg)   SpO2 98%   BMI 30.45 kg/m   GEN: WDWN, NAD, Non-toxic, A & O x 3 HEENT: Atraumatic, Normocephalic. Neck supple. No masses, No LAD. Ears and Nose: No external deformity. CV: Tachycardic to 110, No M/G/R. No JVD. No thrill. No extra heart sounds. PULM: CTA B, no wheezes, crackles, rhonchi. No retractions. No  resp. distress. No accessory muscle use. EXTR: No c/c/e NEURO Normal gait.  PSYCH: Normally interactive. Conversant. Not depressed or anxious appearing.  Calm demeanor.   Laboratory and Imaging Data: Results for orders placed or performed in visit on 06/19/19  Comprehensive metabolic panel  Result Value Ref Range   Sodium 140 135 - 145 mmol/L   Potassium 3.7 3.5 - 5.1 mmol/L   Chloride 102 98 - 111 mmol/L   CO2 26 22 - 32 mmol/L   Glucose, Bld 160 (H) 70 - 99 mg/dL   BUN 17 8 - 23 mg/dL   Creatinine, Ser 1.08 (H) 0.44 - 1.00 mg/dL   Calcium 8.8 (L) 8.9 - 10.3 mg/dL   Total Protein 7.4 6.5 - 8.1 g/dL   Albumin 3.3 (L) 3.5 - 5.0 g/dL   AST 24 15 -  41 U/L   ALT 30 0 - 44 U/L   Alkaline Phosphatase 74 38 - 126 U/L   Total Bilirubin 0.4 0.3 - 1.2 mg/dL   GFR calc non Af Amer 53 (L) >60 mL/min   GFR calc Af Amer >60 >60 mL/min   Anion gap 12 5 - 15  CBC with Differential/Platelet  Result Value Ref Range   WBC 13.0 (H) 4.0 - 10.5 K/uL   RBC 4.66 3.87 - 5.11 MIL/uL   Hemoglobin 13.4 12.0 - 15.0 g/dL   HCT 42.7 36.0 - 46.0 %   MCV 91.6 80.0 - 100.0 fL   MCH 28.8 26.0 - 34.0 pg   MCHC 31.4 30.0 - 36.0 g/dL   RDW 13.2 11.5 - 15.5 %   Platelets 372 150 - 400 K/uL   nRBC 0.0 0.0 - 0.2 %   Neutrophils Relative % 81 %   Neutro Abs 10.6 (H) 1.7 - 7.7 K/uL   Lymphocytes Relative 10 %   Lymphs Abs 1.4 0.7 - 4.0 K/uL   Monocytes Relative 6 %   Monocytes Absolute 0.8 0.1 - 1.0 K/uL   Eosinophils Relative 1 %   Eosinophils Absolute 0.2 0.0 - 0.5 K/uL   Basophils Relative 1 %   Basophils Absolute 0.1 0.0 - 0.1 K/uL   Immature Granulocytes 1 %   Abs Immature Granulocytes 0.07 0.00 - 0.07 K/uL  Prothrombin gene mutation  Result Value Ref Range   Recommendations-PTGENE: Comment   Factor 5 leiden  Result Value Ref Range   Recommendations-F5LEID: Comment     CT ANGIO CHEST PE W OR WO CONTRAST  Result Date: 07/02/2019 CLINICAL DATA:  Follow-up bilateral pulmonary emboli. The patient reports some shortness of breath with no chest pain other than intermittent ?pain in her lungs? which she describes as similar to the pain when she had the pulmonary emboli, lasting approximately 3 hours and then resolving. Ex-smoker. Has been taking Eliquis for the past 4 months. EXAM: CT ANGIOGRAPHY CHEST WITH CONTRAST TECHNIQUE: Multidetector CT imaging of the chest was performed using the standard protocol during bolus administration of intravenous contrast. Multiplanar CT image reconstructions and MIPs were obtained to evaluate the vascular anatomy. CONTRAST:  35mL OMNIPAQUE IOHEXOL 350 MG/ML SOLN COMPARISON:  03/01/2019 FINDINGS: Cardiovascular: Atheromatous  calcifications, including the coronary arteries and aorta. Satisfactory opacification of the pulmonary arteries to the segmental level. No evidence of pulmonary embolism. Normal heart size. No pericardial effusion. Mediastinum/Nodes: Moderate-sized hiatal hernia. Multiple bilateral hilar lymph nodes near the upper limit of normal in size, with mild progression in size. The largest is in the right hilum, measuring 8 mm in maximum diameter on image  number 55 series 6, previously 7 mm. Unremarkable esophagus and thyroid gland. Lungs/Pleura: Stable mild linear scarring at the right lung base and superior aspect of the left upper lobe, medially. Mild biapical pleural and parenchymal scarring. Upper Abdomen: No acute abnormality. Musculoskeletal: A previously demonstrated 12 x 7 mm nodule in the upper outer quadrant of the left breast currently measures 13 x 9 mm. This is a mammographically stable nodule at the time of the patient's screening mammogram dated 01/30/2018, unchanged dating back to 01/01/2011. Mild thoracic spine degenerative changes. Review of the MIP images confirms the above findings. IMPRESSION: 1. No pulmonary emboli or acute abnormality. 2. Mild increase in size of multiple bilateral hilar lymph nodes, most likely reactive. 3. Mild increase in size of a moderate-sized hiatal hernia. 4. Interval mild increase in size of a 13 x 9 mm nodule in the upper outer quadrant of the left breast. The mild increase in size is most likely due to variations in measurements levels since this was previously shown to be mammographically stable over an at least 7 year period of time, most likely representing benign fibroadenoma or cyst. 5.  Calcific coronary artery and aortic atherosclerosis. Aortic Atherosclerosis (ICD10-I70.0). Electronically Signed   By: Claudie Revering M.D.   On: 07/02/2019 16:26   US Venous Img Lower Unilateral Right  Result Date: 06/22/2019 CLINICAL DATA:  69 year old with history of DVT and  pulmonary embolism. Right calf pain. EXAM: RIGHT LOWER EXTREMITY VENOUS DOPPLER ULTRASOUND TECHNIQUE: Gray-scale sonography with graded compression, as well as color Doppler and duplex ultrasound were performed to evaluate the lower extremity deep venous systems from the level of the common femoral vein and including the common femoral, femoral, profunda femoral, popliteal and calf veins including the posterior tibial, peroneal and gastrocnemius veins when visible. The superficial great saphenous vein was also interrogated. Spectral Doppler was utilized to evaluate flow at rest and with distal augmentation maneuvers in the common femoral, femoral and popliteal veins. COMPARISON:  None FINDINGS: Contralateral Common Femoral Vein: Respiratory phasicity is normal and symmetric with the symptomatic side. No evidence of thrombus. Normal compressibility. Common Femoral Vein: No evidence of thrombus. Normal compressibility, respiratory phasicity and response to augmentation. Saphenofemoral Junction: No evidence of thrombus. Normal compressibility and flow on color Doppler imaging. Profunda Femoral Vein: No evidence of thrombus. Normal compressibility and flow on color Doppler imaging. Femoral Vein: No evidence of thrombus. Normal compressibility, respiratory phasicity and response to augmentation. Popliteal Vein: No evidence of thrombus. Normal compressibility, respiratory phasicity and response to augmentation. Calf Veins: No evidence of thrombus. Normal compressibility and flow on color Doppler imaging. Other Findings:  None. IMPRESSION: Negative for deep venous thrombosis in right lower extremity. Electronically Signed   By: Markus Daft M.D.   On: 06/22/2019 14:36     Assessment and Plan:     ICD-10-CM   1. Dyspnea on exertion  R06.00 Ambulatory referral to Cardiology  2. SOB (shortness of breath)  R06.02 EKG 12-Lead    Ambulatory referral to Cardiology  3. History of pulmonary embolism  Z86.711 Ambulatory  referral to Cardiology  4. Tachycardia  R00.0 Ambulatory referral to Cardiology   Level of Medical Decision-Making in this case is Moderate.   I do not think that you can assume that this is residual from her pulmonary embolism given that she was doing quite well up until the last few weeks.  At that point she was not having any dyspnea at all, and over the last 2 weeks she is  having dyspnea on exertion with minimal exertion.  She does have multiple cardiac risk factors as listed above.  She is tachycardic to 110 on cardiac exam as well as EKG.  EKG: Tachycardic to 110.Marland Kitchen Normal axis, normal R wave progression, No acute ST elevation or depression.   I am going to asked my colleagues in cardiology to help assess her to rule out potential cardiac implications for her shortness of breath.  I appreciate their assistance.  Follow-up: No follow-ups on file.  No orders of the defined types were placed in this encounter.  Modified Medications   No medications on file   Orders Placed This Encounter  Procedures  . Ambulatory referral to Cardiology  . EKG 12-Lead    Signed,  Lorain Keast T. Kristyl Athens, MD   Outpatient Encounter Medications as of 07/05/2019  Medication Sig  . apixaban (ELIQUIS) 5 MG TABS tablet Take 1 tablet (5 mg total) by mouth 2 (two) times daily.  Marland Kitchen atorvastatin (LIPITOR) 80 MG tablet TAKE 1 TABLET BY MOUTH DAILY.  . budesonide (ENTOCORT EC) 3 MG 24 hr capsule Take 6 mg by mouth daily.   . cholecalciferol (VITAMIN D) 1000 units tablet Take 1,000 Units by mouth daily.  . colesevelam (WELCHOL) 625 MG tablet TAKE 3 TABLETS BY MOUTH 2 TIMES DAILY WITH A MEAL.  Marland Kitchen gabapentin (NEURONTIN) 100 MG capsule TAKE 1 CAPSULE BY MOUTH UP TO 3 TIMES DAILY AS NEEDED.  Marland Kitchen gabapentin (NEURONTIN) 300 MG capsule TAKE 1 CAPSULE BY MOUTH 2 TIMES DAILY.  . Galcanezumab-gnlm (EMGALITY) 120 MG/ML SOSY Inject 120 mg into the skin every 30 (thirty) days.  . Insulin Glargine (LANTUS SOLOSTAR) 100 UNIT/ML  Solostar Pen Inject 40 Units into the skin daily.  . Insulin Pen Needle (ULTICARE SHORT PEN NEEDLES) 31G X 8 MM MISC USE TO INJECT INSULIN DAILY  . Lancets (ONETOUCH DELICA PLUS [id number]) MISC USE TO CHECK BLOOD SUGAR TWO TIMES DAILY  . losartan-hydrochlorothiazide (HYZAAR) 50-12.5 MG tablet TAKE 1 TABLET BY MOUTH DAILY.  Marland Kitchen omeprazole (PRILOSEC) 20 MG capsule Take 20 mg by mouth every morning.  . OnabotulinumtoxinA (BOTOX IJ) Inject 155 Units as directed every 3 (three) months.   Glory Rosebush VERIO test strip USE TO CHECK BLOOD SUGAR TWO TIMES A DAY  . POTASSIUM BICARBONATE PO Take 510 mg by mouth 2 (two) times daily.  . promethazine (PHENERGAN) 12.5 MG tablet Take 12.5 mg by mouth every 6 (six) hours as needed for nausea or vomiting.  Marland Kitchen Ubrogepant (UBRELVY) 100 MG TABS Take 1 tablet by mouth as needed (May repeat dose after 2 hours if needed.  Maximum 2 tablets in 24 hours).  . [DISCONTINUED] Riboflavin (VITAMIN B2 PO) Take 400 mg by mouth daily.   No facility-administered encounter medications on file as of 07/05/2019.

## 2019-07-06 DIAGNOSIS — E1159 Type 2 diabetes mellitus with other circulatory complications: Secondary | ICD-10-CM | POA: Insufficient documentation

## 2019-07-06 DIAGNOSIS — D473 Essential (hemorrhagic) thrombocythemia: Secondary | ICD-10-CM | POA: Insufficient documentation

## 2019-07-06 DIAGNOSIS — I152 Hypertension secondary to endocrine disorders: Secondary | ICD-10-CM | POA: Insufficient documentation

## 2019-07-09 ENCOUNTER — Encounter: Payer: Self-pay | Admitting: Cardiology

## 2019-07-09 ENCOUNTER — Other Ambulatory Visit: Payer: Self-pay

## 2019-07-09 ENCOUNTER — Encounter: Payer: Self-pay | Admitting: Neurology

## 2019-07-09 ENCOUNTER — Ambulatory Visit (INDEPENDENT_AMBULATORY_CARE_PROVIDER_SITE_OTHER): Payer: Medicare Other | Admitting: Cardiology

## 2019-07-09 VITALS — BP 140/84 | HR 92 | Ht 60.5 in | Wt 161.0 lb

## 2019-07-09 DIAGNOSIS — R0600 Dyspnea, unspecified: Secondary | ICD-10-CM

## 2019-07-09 DIAGNOSIS — R0609 Other forms of dyspnea: Secondary | ICD-10-CM

## 2019-07-09 DIAGNOSIS — I10 Essential (primary) hypertension: Secondary | ICD-10-CM | POA: Diagnosis not present

## 2019-07-09 DIAGNOSIS — E7800 Pure hypercholesterolemia, unspecified: Secondary | ICD-10-CM

## 2019-07-09 NOTE — Progress Notes (Signed)
Cardiology Office Note:    Date:  07/09/2019   ID:  Debra Leach, DOB 12-20-1950, MRN [id number]  PCP:  [name], MD  Cardiologist:  No primary care provider on file.  Electrophysiologist:  None   Referring MD: Owens Loffler, MD   Chief Complaint  Patient presents with  . New Patient (Initial Visit)    SOB with exertion and tachycardia; Meds verbally reviewed with patient.    History of Present Illness:    Debra Leach is a 69 y.o. female with a hx of diabetes, hyperlipidemia, DVT/PE on Eliquis presenting with dyspnea on exertion.  Patient states having shortness of breath for about 3 months now.  Symptoms are worse with exertion and improved with rest.  Denies chest pain palpitations, edema, orthopnea.  She was diagnosed with DVT PE roughly 3 months ago.  Placed on Eliquis.  Is being seen by hematology.  Since discharge her symptoms of shortness of breath improved up to a month ago which have worsened.  An echocardiogram in September 2020 showed normal ejection fraction, mild LVH, EF 70 to 75%, impaired relaxation.  She is a former smoker for about 12 years.   Past Medical History:  Diagnosis Date  . Anemia   . Arthritis    osteoarthritis B knees, left hip , right elbow  . Clotting disorder ([place])   . Complication of anesthesia    difficult waking  . Diabetes mellitus without complication ([place])    Diet and exercise controlled  . GERD (gastroesophageal reflux disease)   . Headache   . History of kidney stones   . Hyperlipidemia   . MVP (mitral valve prolapse)    history of  . PONV (postoperative nausea and vomiting)     Past Surgical History:  Procedure Laterality Date  . arm surgery    . BREAST BIOPSY Left   . BREAST SURGERY  2000   breast biopsy (benign)  . LITHOTRIPSY  08-12-2008   stent placed bilaterally  . PARTIAL HYSTERECTOMY     Both ovaries remain, vaginal, for mennorhagia  . TONSILLECTOMY    . TUBAL LIGATION  1980    Current  Medications: Current Meds  Medication Sig  . POTASSIUM BICARBONATE PO Take 595 mg by mouth 1 day or 1 dose.      Allergies:   Atenolol, Codeine, Penicillins, Pentazocine lactate, Propranolol, Crestor [rosuvastatin], and Sudafed [pseudoephedrine hcl]   Social History   Socioeconomic History  . Marital status: Married    Spouse name: Carloyn Manner  . Number of children: 0  . Years of education: Not on file  . Highest education level: Not on file  Occupational History  . Occupation: retired Energy manager: retired  Tobacco Use  . Smoking status: Former Research scientist (life sciences)  . Smokeless tobacco: Never Used  Substance and Sexual Activity  . Alcohol use: No  . Drug use: No  . Sexual activity: Not on file  Other Topics Concern  . Not on file  Social History Narrative   Regular exercise--yes, recumbent bike 3-4 days a week      Diet: fruits and veggies, water, eats at home, drinks a lot of milk      Patient is right-handed. She drinks 1-2 cups of coffee a day.      One story home   Social Determinants of Health   Financial Resource Strain: Low Risk   . Difficulty of Paying Living Expenses: Not hard at all  Food Insecurity: No Food  Insecurity  . Worried About Charity fundraiser in the Last Year: Never true  . Ran Out of Food in the Last Year: Never true  Transportation Needs: No Transportation Needs  . Lack of Transportation (Medical): No  . Lack of Transportation (Non-Medical): No  Physical Activity: Inactive  . Days of Exercise per Week: 0 days  . Minutes of Exercise per Session: 0 min  Stress: No Stress Concern Present  . Feeling of Stress : Not at all  Social Connections:   . Frequency of Communication with Friends and Family: Not on file  . Frequency of Social Gatherings with Friends and Family: Not on file  . Attends Religious Services: Not on file  . Active Member of Clubs or Organizations: Not on file  . Attends Archivist Meetings: Not on file  . Marital  Status: Not on file     Family History: The patient's family history includes Aortic aneurysm in her brother; Arthritis in her brother and brother; Asthma in her brother and brother; Breast cancer in her maternal aunt and maternal aunt; Cancer in her mother; Hypertension in her father; Mitral valve prolapse in her father; Nephrolithiasis in her brother and brother.  ROS:   Please see the history of present illness.     All other systems reviewed and are negative.  EKGs/Labs/Other Studies Reviewed:    The following studies were reviewed today:   EKG:  EKG is  ordered today.  The ekg ordered today demonstrates normal sinus rhythm, normal ECG.  Recent Labs: 03/01/2019: B Natriuretic Peptide 29.7 06/19/2019: ALT 30; BUN 17; Creatinine, Ser 1.08; Hemoglobin 13.4; Platelets 372; Potassium 3.7; Sodium 140  Recent Lipid Panel    Component Value Date/Time   CHOL 275 (H) 01/08/2019 0940   TRIG 207.0 (H) 01/08/2019 0940   HDL 61.50 01/08/2019 0940   CHOLHDL 4 01/08/2019 0940   VLDL 41.4 (H) 01/08/2019 0940   LDLCALC 147 (H) 10/09/2018 0853   LDLDIRECT 183.0 01/08/2019 0940    Physical Exam:    VS:  BP 140/84 (BP Location: Right Arm, Patient Position: Sitting, Cuff Size: Normal)   Pulse 92   Ht 5' 0.5" (1.537 m)   Wt 161 lb (73 kg)   SpO2 97%   BMI 30.93 kg/m     Wt Readings from Last 3 Encounters:  07/09/19 161 lb (73 kg)  07/05/19 158 lb 8 oz (71.9 kg)  05/11/19 159 lb 12 oz (72.5 kg)     GEN:  Well nourished, well developed in no acute distress HEENT: Normal NECK: No JVD; No carotid bruits LYMPHATICS: No lymphadenopathy CARDIAC: RRR, no murmurs, rubs, gallops RESPIRATORY:  Clear to auscultation without rales, wheezing or rhonchi  ABDOMEN: Soft, non-tender, non-distended MUSCULOSKELETAL:  No edema; No deformity  SKIN: Warm and dry NEUROLOGIC:  Alert and oriented x 3 PSYCHIATRIC:  Normal affect   ASSESSMENT:    1. Dyspnea on exertion   2. Pure hypercholesterolemia     3. Essential hypertension    PLAN:    In order of problems listed above:  1. Patient with dyspnea on exertion.  Last echocardiogram showed normal EF with impaired relaxation.  Her symptoms of shortness of breath could be an anginal equivalent.  She has risk factors of hypertension, prior smoking history, hyperlipidemia.  Will evaluate presence of CAD with Lexiscan myocardial perfusion imaging.  Stress testing is normal, consider pulmonary etiology in light of recent history of PEs.   2. Hyperlipidemia, her 10-year ASCVD risk is  9.9.  Last LDL is 183 on high intensity statin of Lipitor 80.  She has repeat lab work tomorrow.  Follow-up visit if cholesterol and LDL is not well managed, will plan to add Repatha to regimen. 3. Blood pressure reasonably controlled.  Continue current medications for now.    Follow-up after echo and stress test.  This note was generated in part or whole with voice recognition software. Voice recognition is usually quite accurate but there are transcription errors that can and very often do occur. I apologize for any typographical errors that were not detected and corrected.  Medication Adjustments/Labs and Tests Ordered: Current medicines are reviewed at length with the patient today.  Concerns regarding medicines are outlined above.  Orders Placed This Encounter  Procedures  . NM Myocar Multi W/Spect W/Wall Motion / EF  . EKG 12-Lead   No orders of the defined types were placed in this encounter.   Patient Instructions  Medication Instructions:  - Your physician recommends that you continue on your current medications as directed. Please refer to the Current Medication list given to you today.  *If you need a refill on your cardiac medications before your next appointment, please call your pharmacy*  Lab Work: None ordered  If you have labs (blood work) drawn today and your tests are completely normal, you will receive your results only by: [facility] MyChart  Message (if you have MyChart) OR . A paper copy in the mail If you have any lab test that is abnormal or we need to change your treatment, we will call you to review the results.  Testing/Procedures: - Your physician has requested that you have a lexiscan myoview.  [place]  Your caregiver has ordered a Stress Test with nuclear imaging. The purpose of this test is to evaluate the blood supply to your heart muscle. This procedure is referred to as a "Non-Invasive Stress Test." This is because other than having an IV started in your vein, nothing is inserted or "invades" your body. Cardiac stress tests are done to find areas of poor blood flow to the heart by determining the extent of coronary artery disease (CAD). Some patients exercise on a treadmill, which naturally increases the blood flow to your heart, while others who are  unable to walk on a treadmill due to physical limitations have a pharmacologic/chemical stress agent called Lexiscan . This medicine will mimic walking on a treadmill by temporarily increasing your coronary blood flow.   Please note: these test may take anywhere between 2-4 hours to complete  PLEASE REPORT TO [facility] AT THE FIRST DESK WILL DIRECT YOU WHERE TO GO  Date of Procedure:_____________________________________  Arrival Time for Procedure:______________________________  Instructions regarding medication:   _x___ : Hold diabetes medication morning of procedure (oral & insulin)  _x___:  You may take all of your other medications the morning of your test with enough water to get them down safely  PLEASE NOTIFY THE OFFICE AT LEAST 24 HOURS IN ADVANCE IF YOU ARE UNABLE TO [place].  [phone] AND  PLEASE NOTIFY NUCLEAR MEDICINE AT [facility] AT LEAST 24 HOURS IN ADVANCE IF YOU ARE UNABLE TO KEEP YOUR APPOINTMENT. [phone]  How to prepare for your Myoview test:  1. Do not eat or drink after midnight 2. No  caffeine for 24 hours prior to test 3. No smoking 24 hours prior to test. 4. Your medication may be taken with water.  If your doctor stopped  a medication because of this test, do not take that medication. 5. Ladies, please do not wear dresses.  Skirts or pants are appropriate. Please wear a short sleeve shirt. 6. No perfume, cologne or lotion. 7. Wear comfortable walking shoes. No heels!   Follow-Up: At [facility], you and your health needs are our priority.  As part of our continuing mission to provide you with exceptional heart care, we have created designated Provider Care Teams.  These Care Teams include your primary Cardiologist (physician) and Advanced Practice Providers (APPs -  Physician Assistants and Nurse Practitioners) who all work together to provide you with the care you need, when you need it.  Your next appointment:   2-3 weeks/ after cardiac testing is complete   The format for your next appointment:   In Person  Provider:   Kate Sable, MD  Other Instructions N/a   Cardiac Nuclear Scan A cardiac nuclear scan is a test that measures blood flow to the heart when a person is resting and when he or she is exercising. The test looks for problems such as:  Not enough blood reaching a portion of the heart.  The heart muscle not working normally. You may need this test if:  You have heart disease.  You have had abnormal lab results.  You have had heart surgery or a balloon procedure to open up blocked arteries (angioplasty).  You have chest pain.  You have shortness of breath. In this test, a radioactive dye (tracer) is injected into your bloodstream. After the tracer has traveled to your heart, an imaging device is used to measure how much of the tracer is absorbed by or distributed to various areas of your heart. This procedure is usually done at a hospital and takes 2-4 hours. Tell a health care provider about:  Any allergies you have.  All  medicines you are taking, including vitamins, herbs, eye drops, creams, and over-the-counter medicines.  Any problems you or family members have had with anesthetic medicines.  Any blood disorders you have.  Any surgeries you have had.  Any medical conditions you have.  Whether you are pregnant or may be pregnant. What are the risks? Generally, this is a safe procedure. However, problems may occur, including:  Serious chest pain and heart attack. This is only a risk if the stress portion of the test is done.  Rapid heartbeat.  Sensation of warmth in your chest. This usually passes quickly.  Allergic reaction to the tracer. What happens before the procedure?  Ask your health care provider about changing or stopping your regular medicines. This is especially important if you are taking diabetes medicines or blood thinners.  Follow instructions from your health care provider about eating or drinking restrictions.  Remove your jewelry on the day of the procedure. What happens during the procedure?  An IV will be inserted into one of your veins.  Your health care provider will inject a small amount of radioactive tracer through the IV.  You will wait for 20-40 minutes while the tracer travels through your bloodstream.  Your heart activity will be monitored with an electrocardiogram (ECG).  You will lie down on an exam table.  Images of your heart will be taken for about 15-20 minutes.  You may also have a stress test. For this test, one of the following may be done: ? You will exercise on a treadmill or stationary bike. While you exercise, your heart's activity will be monitored with an ECG,  and your blood pressure will be checked. ? You will be given medicines that will increase blood flow to parts of your heart. This is done if you are unable to exercise.  When blood flow to your heart has peaked, a tracer will again be injected through the IV.  After 20-40 minutes, you  will get back on the exam table and have more images taken of your heart.  Depending on the type of tracer used, scans may need to be repeated 3-4 hours later.  Your IV line will be removed when the procedure is over. The procedure may vary among health care providers and hospitals. What happens after the procedure?  Unless your health care provider tells you otherwise, you may return to your normal schedule, including diet, activities, and medicines.  Unless your health care provider tells you otherwise, you may increase your fluid intake. This will help to flush the contrast dye from your body. Drink enough fluid to keep your urine pale yellow.  Ask your health care provider, or the department that is doing the test: ? When will my results be ready? ? How will I get my results? Summary  A cardiac nuclear scan measures the blood flow to the heart when a person is resting and when he or she is exercising.  Tell your health care provider if you are pregnant.  Before the procedure, ask your health care provider about changing or stopping your regular medicines. This is especially important if you are taking diabetes medicines or blood thinners.  After the procedure, unless your health care provider tells you otherwise, increase your fluid intake. This will help flush the contrast dye from your body.  After the procedure, unless your health care provider tells you otherwise, you may return to your normal schedule, including diet, activities, and medicines. This information is not intended to replace advice given to you by your health care provider. Make sure you discuss any questions you have with your health care provider. Document Revised: 11/07/2017 Document Reviewed: 11/07/2017 Elsevier Patient Education  2020 [place], Kate Sable, MD  07/09/2019 12:21 PM    [place]

## 2019-07-09 NOTE — Patient Instructions (Signed)
Medication Instructions:  - Your physician recommends that you continue on your current medications as directed. Please refer to the Current Medication list given to you today.  *If you need a refill on your cardiac medications before your next appointment, please call your pharmacy*  Lab Work: None ordered  If you have labs (blood work) drawn today and your tests are completely normal, you will receive your results only by: Mar[facility] Kitchen MyChart Message (if you have MyChart) OR . A paper copy in the mail If you have any lab test that is abnormal or we need to change your treatment, we will call you to review the results.  Testing/Procedures: - Your physician has requested that you have a lexiscan myoview.  [place]  Your caregiver has ordered a Stress Test with nuclear imaging. The purpose of this test is to evaluate the blood supply to your heart muscle. This procedure is referred to as a "Non-Invasive Stress Test." This is because other than having an IV started in your vein, nothing is inserted or "invades" your body. Cardiac stress tests are done to find areas of poor blood flow to the heart by determining the extent of coronary artery disease (CAD). Some patients exercise on a treadmill, which naturally increases the blood flow to your heart, while others who are  unable to walk on a treadmill due to physical limitations have a pharmacologic/chemical stress agent called Lexiscan . This medicine will mimic walking on a treadmill by temporarily increasing your coronary blood flow.   Please note: these test may take anywhere between 2-4 hours to complete  PLEASE REPORT TO [place] AT THE FIRST DESK WILL DIRECT YOU WHERE TO GO  Date of Procedure:_____________________________________  Arrival Time for Procedure:______________________________  Instructions regarding medication:   _x___ : Hold diabetes medication morning of procedure (oral & insulin)  _x___:   You may take all of your other medications the morning of your test with enough water to get them down safely  PLEASE NOTIFY THE OFFICE AT LEAST 24 HOURS IN ADVANCE IF YOU ARE UNABLE TO [place].  [phone] AND  PLEASE NOTIFY NUCLEAR MEDICINE AT [facility] AT LEAST 24 HOURS IN ADVANCE IF YOU ARE UNABLE TO KEEP YOUR APPOINTMENT. [phone]  How to prepare for your Myoview test:  1. Do not eat or drink after midnight 2. No caffeine for 24 hours prior to test 3. No smoking 24 hours prior to test. 4. Your medication may be taken with water.  If your doctor stopped a medication because of this test, do not take that medication. 5. Ladies, please do not wear dresses.  Skirts or pants are appropriate. Please wear a short sleeve shirt. 6. No perfume, cologne or lotion. 7. Wear comfortable walking shoes. No heels!   Follow-Up: At [facility], you and your health needs are our priority.  As part of our continuing mission to provide you with exceptional heart care, we have created designated Provider Care Teams.  These Care Teams include your primary Cardiologist (physician) and Advanced Practice Providers (APPs -  Physician Assistants and Nurse Practitioners) who all work together to provide you with the care you need, when you need it.  Your next appointment:   2-3 weeks/ after cardiac testing is complete   The format for your next appointment:   In Person  Provider:   Kate Sable, MD  Other Instructions N/a   Cardiac Nuclear Scan A cardiac nuclear scan is a test that  measures blood flow to the heart when a person is resting and when he or she is exercising. The test looks for problems such as:  Not enough blood reaching a portion of the heart.  The heart muscle not working normally. You may need this test if:  You have heart disease.  You have had abnormal lab results.  You have had heart surgery or a balloon procedure to open up blocked arteries  (angioplasty).  You have chest pain.  You have shortness of breath. In this test, a radioactive dye (tracer) is injected into your bloodstream. After the tracer has traveled to your heart, an imaging device is used to measure how much of the tracer is absorbed by or distributed to various areas of your heart. This procedure is usually done at a hospital and takes 2-4 hours. Tell a health care provider about:  Any allergies you have.  All medicines you are taking, including vitamins, herbs, eye drops, creams, and over-the-counter medicines.  Any problems you or family members have had with anesthetic medicines.  Any blood disorders you have.  Any surgeries you have had.  Any medical conditions you have.  Whether you are pregnant or may be pregnant. What are the risks? Generally, this is a safe procedure. However, problems may occur, including:  Serious chest pain and heart attack. This is only a risk if the stress portion of the test is done.  Rapid heartbeat.  Sensation of warmth in your chest. This usually passes quickly.  Allergic reaction to the tracer. What happens before the procedure?  Ask your health care provider about changing or stopping your regular medicines. This is especially important if you are taking diabetes medicines or blood thinners.  Follow instructions from your health care provider about eating or drinking restrictions.  Remove your jewelry on the day of the procedure. What happens during the procedure?  An IV will be inserted into one of your veins.  Your health care provider will inject a small amount of radioactive tracer through the IV.  You will wait for 20-40 minutes while the tracer travels through your bloodstream.  Your heart activity will be monitored with an electrocardiogram (ECG).  You will lie down on an exam table.  Images of your heart will be taken for about 15-20 minutes.  You may also have a stress test. For this test, one  of the following may be done: ? You will exercise on a treadmill or stationary bike. While you exercise, your heart's activity will be monitored with an ECG, and your blood pressure will be checked. ? You will be given medicines that will increase blood flow to parts of your heart. This is done if you are unable to exercise.  When blood flow to your heart has peaked, a tracer will again be injected through the IV.  After 20-40 minutes, you will get back on the exam table and have more images taken of your heart.  Depending on the type of tracer used, scans may need to be repeated 3-4 hours later.  Your IV line will be removed when the procedure is over. The procedure may vary among health care providers and hospitals. What happens after the procedure?  Unless your health care provider tells you otherwise, you may return to your normal schedule, including diet, activities, and medicines.  Unless your health care provider tells you otherwise, you may increase your fluid intake. This will help to flush the contrast dye from your body. Drink enough  fluid to keep your urine pale yellow.  Ask your health care provider, or the department that is doing the test: ? When will my results be ready? ? How will I get my results? Summary  A cardiac nuclear scan measures the blood flow to the heart when a person is resting and when he or she is exercising.  Tell your health care provider if you are pregnant.  Before the procedure, ask your health care provider about changing or stopping your regular medicines. This is especially important if you are taking diabetes medicines or blood thinners.  After the procedure, unless your health care provider tells you otherwise, increase your fluid intake. This will help flush the contrast dye from your body.  After the procedure, unless your health care provider tells you otherwise, you may return to your normal schedule, including diet, activities, and  medicines. This information is not intended to replace advice given to you by your health care provider. Make sure you discuss any questions you have with your health care provider. Document Revised: 11/07/2017 Document Reviewed: 11/07/2017 Elsevier Patient Education  [place].

## 2019-07-09 NOTE — Progress Notes (Signed)
Due to the COVID-19 crisis, this virtual check-in visit was done via telephone from my office and it was initiated and consent given by this patient and or family.   Telephone (Audio) Visit The purpose of this telephone visit is to provide medical care while limiting exposure to the novel coronavirus.    Consent was obtained for telephone visit and initiated by pt/family:  Yes.   Answered questions that patient had about telehealth interaction:  Yes.   I discussed the limitations, risks, security and privacy concerns of performing an evaluation and management service by telephone. I also discussed with the patient that there may be a patient responsible charge related to this service. The patient expressed understanding and agreed to proceed.  Pt location: Home Physician Location: office Name of referring provider:  [name], MD I connected with .Debra Leach at patients initiation/request on 07/11/2019 at  8:50 AM EST by telephone and verified that I am speaking with the correct person using two identifiers.  Pt MRN:  [id number] Pt DOB:  1950/06/25   History of Present Illness:  Debra Leach is a 69 year old right-handed female with autoimmune pancreatitis, migraines, hyperlipidemia, diabetes, GERD, osteoarthritis, and history of MVP and renal stones who follows up for migraines.  UPDATE: Over last 184 days, she has had 55 headache-free days. 7 7/10 with 5 hours, and 2 were 9/10 lasting 5 hours.  Otherwise, they are more moderate and last 3-4 hours.    Current NSAIDS/steroid:budesomide 9mg  daily Current analgesics:None Current triptans:None Current ergotamine:None Current anti-emetic:Promethazine 12.5 mg Current muscle relaxants:None Current anti-anxiolytic:None Current sleep aide:None Current Antihypertensive medications:None Current Antidepressant medications:None Current Anticonvulsant medications:Gabapentin 300mg  twice daily Current  anti-CGRP:Emgality; Roselyn Meier Current Vitamins/Herbal/Supplements:Riboflavin 400 mg daily, vitamin D Current Antihistamines/Decongestants:None Other therapy:Botox, Cefaly, ice pack Other medication:Meclizine  Caffeine:1 cup of coffee daily Alcohol:None Smoker:No Diet:Follows LEAP ImmunoCalm Dietary Program. Hydrates. Exercise:When tolerated (knee problems). She walks and bikes. Depression:No; Anxiety:No Other pain:no Sleep hygiene:Improved  HISTORY: Onset: 69 years old Location:Varies (unilateral either side, frontal-temporal, back of head) Quality:Varies (stabbing, pounding, throbbing) Initial Intensity:10/10 severe, otherwise 5-7/10 Aura:Occurs off and on over the years and varies in semiology.In early 1970s, black out.In late 1980s, white out.In late 1990s, flashing lights and zigzag lines.Since 2016, scintillating scotoma (occurs 1 to 2 times a month) Prodrome:no Associated symptoms: Initially vomiting.Sometimes nausea.Photophobia, phonophobia.Dizziness. Initial Duration:All day Initial Frequency:daily Triggers: Change in weather, emotional stress, valsalva maneuver, odors, certain foods Relieving factors: ice, rest Activity:Difficult to function if severe  Past NSAIDS:diclofenac 50mg , Cambia, Mobic 15mg , ibuprofen, naproxen, toradol  Past analgesics:tramadol (reaction), Midrin, Excedrin Past abortive triptans/ergots:Treximet, Axert, Amerge, Frova, Maxalt, Relpax, Zomig tablet, DHE NS, sumatriptan 100mg ; sumatriptan 6mg  [place] Past anti-emetic: Zofran 4mg  Past anxiolytic:Buspirone, clonazepam Past antihypertensive medications:Metoprolol, Norvasc, propranolol ER 120mg , atenolol 100mg  (side effects dizziness, myalgias, acid reflux) Past antidepressant medications:Venlafaxine, sertraline, amitriptyline, amoxapine, duloxetine, nortriptyline, imipramine, Luvox, Remeron, Serzone, bupropion, desipramine, doxepin, Vivactil Past  anticonvulsant medications:Depakote, Topamax, zonisamide, Lamictal, Keppra, Lyrica, gabapentin 300/300/600 Past anti--CGRP: Aimovig Past vitamins/Herbal/Supplements:butterbur, Mg, CoQ10 Other past treatments:Botox (2 years), Cefaly Device, methylergonovine, acupuncture, biofeedback, Seroquel, Thorazine, methylsergide maleate  Family history of headache:Maybe her mother's side.  Prior brain MRI normal (date unknown but report is mentioned in prior notes).  Abnormal Movements: In December 2017, we started atenolol for migraine prevention. It worked well, but due to reported side effects of dizziness, acid reflux and joint and muscle pain, she was tapered off of it in early April. Around that time, she developed shaking episodes or tremors associated  with her migraines. She followed up with her PCP, Dr. Diona Browner, on 10/08/16 and we had her discontinue the gabapentin. She hasn't had any recurrent tremors, however she says she has gone up to 4 days at a time without an attack. Since discontinuing the gabapentin, the intensity of her daily headaches have gotten worse.   I reviewed the video of her habitual attacks. In the video, she exhibits flapping of her right hand, arrhythmic and without tremor or choreiform movement. It is not an arrhythmic jerking, consistent with myoclonus. It sometimes involves the left hand as well. She denies stress and anxiety.    Observations/Objective:   Height 5\' 1"  (1.549 m), weight 153 lb (69.4 kg).  Assessment and Plan:   Chronic migraine without aura, without status migrainosus, not intractable, overall stable  1.  For preventative management, Botox and Emgality 2.  For abortive therapy, Ubrelvy 3.  Limit use of pain relievers to no more than 2 days out of week to prevent risk of rebound or medication-overuse headache. 4.  Keep headache diary 5.  Follow up for next round of Botox on 3/19.   Need for in person visit now:  No.   Follow Up  Instructions:    -I discussed the assessment and treatment plan with the patient. The patient was provided an opportunity to ask questions and all were answered. The patient agreed with the plan and demonstrated an understanding of the instructions.   The patient was advised to call back or seek an in-person evaluation if the symptoms worsen or if the condition fails to improve as anticipated.    Total Time spent in visit with the patient was:  12 minutes.  Dudley Major, DO

## 2019-07-10 ENCOUNTER — Telehealth: Payer: Self-pay | Admitting: Family Medicine

## 2019-07-10 ENCOUNTER — Other Ambulatory Visit (INDEPENDENT_AMBULATORY_CARE_PROVIDER_SITE_OTHER): Payer: Medicare Other

## 2019-07-10 DIAGNOSIS — E119 Type 2 diabetes mellitus without complications: Secondary | ICD-10-CM | POA: Diagnosis not present

## 2019-07-10 DIAGNOSIS — E559 Vitamin D deficiency, unspecified: Secondary | ICD-10-CM

## 2019-07-10 LAB — LIPID PANEL
Cholesterol: 234 mg/dL — ABNORMAL HIGH (ref 0–200)
HDL: 53.7 mg/dL (ref >39.00)
LDL Cholesterol: 145 mg/dL — ABNORMAL HIGH (ref 0–99)
NonHDL: 180.21
Total CHOL/HDL Ratio: 4
Triglycerides: 175 mg/dL — ABNORMAL HIGH (ref 0.0–149.0)
VLDL: 35 mg/dL (ref 0.0–40.0)

## 2019-07-10 LAB — COMPREHENSIVE METABOLIC PANEL
ALT: 17 U/L (ref 0–35)
AST: 14 U/L (ref 0–37)
Albumin: 3.5 g/dL (ref 3.5–5.2)
Alkaline Phosphatase: 73 U/L (ref 39–117)
BUN: 21 mg/dL (ref 6–23)
CO2: 30 mEq/L (ref 19–32)
Calcium: 9.2 mg/dL (ref 8.4–10.5)
Chloride: 103 mEq/L (ref 96–112)
Creatinine, Ser: 1.17 mg/dL (ref 0.40–1.20)
GFR: 45.98 mL/min — ABNORMAL LOW (ref >60.00)
Glucose, Bld: 123 mg/dL — ABNORMAL HIGH (ref 70–99)
Potassium: 3.9 mEq/L (ref 3.5–5.1)
Sodium: 142 mEq/L (ref 135–145)
Total Bilirubin: 0.4 mg/dL (ref 0.2–1.2)
Total Protein: 6.4 g/dL (ref 6.0–8.3)

## 2019-07-10 LAB — HEMOGLOBIN A1C: Hgb A1c MFr Bld: 8.7 % — ABNORMAL HIGH (ref 4.6–6.5)

## 2019-07-10 NOTE — Telephone Encounter (Signed)
-----   Message from Cloyd Stagers, RT sent at 06/29/2019  1:29 PM EST ----- Regarding: Lab Orders forTuesday 2.2.2021 Please place lab orders for Tuesday 2.2.2021, office visit for DM f/u on Friday 2.5.2021 Thank you, Dyke Maes RT(R)

## 2019-07-10 NOTE — Progress Notes (Signed)
No critical labs need to be addressed urgently. We will discuss labs in detail at upcoming office visit.   

## 2019-07-11 ENCOUNTER — Encounter: Payer: Self-pay | Admitting: Neurology

## 2019-07-11 ENCOUNTER — Other Ambulatory Visit: Payer: Self-pay

## 2019-07-11 ENCOUNTER — Telehealth (INDEPENDENT_AMBULATORY_CARE_PROVIDER_SITE_OTHER): Payer: Medicare Other | Admitting: Neurology

## 2019-07-11 VITALS — Ht 61.0 in | Wt 153.0 lb

## 2019-07-11 DIAGNOSIS — G43709 Chronic migraine without aura, not intractable, without status migrainosus: Secondary | ICD-10-CM

## 2019-07-13 ENCOUNTER — Other Ambulatory Visit: Payer: Self-pay

## 2019-07-13 ENCOUNTER — Ambulatory Visit (INDEPENDENT_AMBULATORY_CARE_PROVIDER_SITE_OTHER): Payer: Medicare Other | Admitting: Family Medicine

## 2019-07-13 ENCOUNTER — Encounter: Payer: Self-pay | Admitting: Family Medicine

## 2019-07-13 VITALS — BP 128/70 | HR 99 | Temp 97.4°F | Ht 60.05 in | Wt 157.4 lb

## 2019-07-13 DIAGNOSIS — E119 Type 2 diabetes mellitus without complications: Secondary | ICD-10-CM

## 2019-07-13 DIAGNOSIS — R0600 Dyspnea, unspecified: Secondary | ICD-10-CM | POA: Insufficient documentation

## 2019-07-13 DIAGNOSIS — IMO0002 Reserved for concepts with insufficient information to code with codable children: Secondary | ICD-10-CM

## 2019-07-13 DIAGNOSIS — G43709 Chronic migraine without aura, not intractable, without status migrainosus: Secondary | ICD-10-CM

## 2019-07-13 DIAGNOSIS — F8081 Childhood onset fluency disorder: Secondary | ICD-10-CM | POA: Insufficient documentation

## 2019-07-13 DIAGNOSIS — I152 Hypertension secondary to endocrine disorders: Secondary | ICD-10-CM

## 2019-07-13 DIAGNOSIS — E785 Hyperlipidemia, unspecified: Secondary | ICD-10-CM | POA: Diagnosis not present

## 2019-07-13 DIAGNOSIS — E1159 Type 2 diabetes mellitus with other circulatory complications: Secondary | ICD-10-CM

## 2019-07-13 DIAGNOSIS — I10 Essential (primary) hypertension: Secondary | ICD-10-CM

## 2019-07-13 DIAGNOSIS — R0609 Other forms of dyspnea: Secondary | ICD-10-CM

## 2019-07-13 NOTE — Assessment & Plan Note (Addendum)
Intermittant nature.. suggests psychological cause, but pt denies anxiety.  Will have her see our embedded psychologist to make sure it is not due to medicaiton interaction or SE, ie gabapentin. She will also follow up with neuro to consider late effect of chronic migraine vs seizures.

## 2019-07-13 NOTE — Patient Instructions (Signed)
Disucss stuttering with neuro. I will look into you seeing pharmacist.  Continue current regimen and keep appt  For stress test as planned.

## 2019-07-13 NOTE — Assessment & Plan Note (Addendum)
Low cholesterol diet , welchiol and max atorvastatin. Has not tolerated crestor or zetia in past. Has improved some but not  At goal.  Will await cardiac recommendations for additional med.Marland Kitchen

## 2019-07-13 NOTE — Addendum Note (Signed)
Addended by: Eliezer Lofts E on: 07/13/2019 06:11 PM   Modules accepted: Orders

## 2019-07-13 NOTE — Assessment & Plan Note (Signed)
Improving control but not at goal A1C... home numbers are better in last month.  Continue current regimen.

## 2019-07-13 NOTE — Progress Notes (Signed)
Chief Complaint  Patient presents with  . Follow-up  . Diabetes    History of Present Illness: HPI    69 year old female presents for follow up DM.  She has spells of stuttering for past several years ( mid 2019), but it seems to be getting worse in the last few months.  Lasts several hours off and on every few day.  She does not  clearly note an association with migraine or new meds. She has not talked  With neuro about this yet. Dr. Tomi Likens  She does not feel anxious, she is under a lot of stress with family and health issues but stuttering does not seem to be related to stressful times.   On emalgity, gabapentin   Has noted some tremor as well in past... stopped gabapentin temporarily, but specialist felt was not related to gabapentin. Resolved after stopping nausea med.   Currently autoimmune pancreatitis and severe migraine being treated by Gi and neuro respectively. Most recent OV reviewed.  On 6 mg budesonide daily  Diabetes:  Inadequate but improving control but A1C does not reflect  CBGs... trending down. Lab Results  Component Value Date   HGBA1C 8.7 (H) 07/10/2019  Using medications without difficulties: Hypoglycemic episodes: none in last 3 motnhs Hyperglycemic episodes: occ Feet problems:no ulcers Blood Sugars averaging:   In last 5 weeks FBS  103-115, occ 139,, 2 hours after meals 150-190 eye exam within last year: yes  Poor control cholesterol...  On welchol and atorvastatin, low chol diet Has not tolerated crestor or zetia in past.  Hypertension:    BP Readings from Last 3 Encounters:  07/13/19 128/70  07/09/19 140/84  07/05/19 138/80  Using medication without problems or lightheadedness:  none Chest pain with exertion:none  Edema:none Short of breath: SOB never went away after PE, worsened in last few weeks,  DOE 1/28.Marland Kitchen referred to Cardiology Dr.Agbor Etang... having stress test Tuesday Average home BPs: Other issues: She is currently on  Eliquis. Neg CT angio and [place] on 07/02/2019 She also had a Doppler for ultrasound and had a evidence of no DVT. echocardiogram in September 2020.  She had some mild LVH on her echo.  She also had some impaired diastolic dysfunction.  On this echo there is no evidence of mitral valve regurgitation.  Mild aortic valve sclerosis.   This visit occurred during the SARS-CoV-2 public health emergency.  Safety protocols were in place, including screening questions prior to the visit, additional usage of staff PPE, and extensive cleaning of exam room while observing appropriate contact time as indicated for disinfecting solutions.   COVID 19 screen:  No recent travel or known exposure to COVID19 The patient denies respiratory symptoms of COVID 19 at this time. The importance of social distancing was discussed today.     Review of Systems  Constitutional: Positive for malaise/fatigue. Negative for chills and fever.  HENT: Negative for congestion and ear pain.   Eyes: Negative for pain and redness.  Respiratory: Positive for shortness of breath. Negative for cough.   Cardiovascular: Negative for chest pain, palpitations and leg swelling.  Gastrointestinal: Negative for abdominal pain, blood in stool, constipation, diarrhea, nausea and vomiting.  Genitourinary: Negative for dysuria.  Musculoskeletal: Negative for falls and myalgias.  Skin: Negative for rash.  Neurological: Negative for dizziness.  Psychiatric/Behavioral: Negative for depression. The patient is not nervous/anxious.       Past Medical History:  Diagnosis Date  . Anemia   . Arthritis  osteoarthritis B knees, left hip , right elbow  . Clotting disorder ([facility])   . Complication of anesthesia    difficult waking  . Diabetes mellitus without complication ([place])    Diet and exercise controlled  . GERD (gastroesophageal reflux disease)   . Headache   . History of kidney stones   . Hyperlipidemia   . MVP (mitral valve prolapse)     history of  . PONV (postoperative nausea and vomiting)     reports that she has quit smoking. She has never used smokeless tobacco. She reports that she does not drink alcohol or use drugs.   Current Outpatient Medications:  .  apixaban (ELIQUIS) 5 MG TABS tablet, Take 1 tablet (5 mg total) by mouth 2 (two) times daily., Disp: 180 tablet, Rfl: 1 .  atorvastatin (LIPITOR) 80 MG tablet, TAKE 1 TABLET BY MOUTH DAILY., Disp: 90 tablet, Rfl: 3 .  budesonide (ENTOCORT EC) 3 MG 24 hr capsule, Take 6 mg by mouth daily. , Disp: , Rfl:  .  cholecalciferol (VITAMIN D) 1000 units tablet, Take 1,000 Units by mouth daily., Disp: , Rfl:  .  colesevelam (WELCHOL) 625 MG tablet, TAKE 3 TABLETS BY MOUTH 2 TIMES DAILY WITH A MEAL., Disp: 540 tablet, Rfl: 3 .  gabapentin (NEURONTIN) 100 MG capsule, TAKE 1 CAPSULE BY MOUTH UP TO 3 TIMES DAILY AS NEEDED., Disp: 270 capsule, Rfl: 3 .  gabapentin (NEURONTIN) 300 MG capsule, TAKE 1 CAPSULE BY MOUTH 2 TIMES DAILY., Disp: 180 capsule, Rfl: 0 .  Galcanezumab-gnlm (EMGALITY) 120 MG/ML SOSY, Inject 120 mg into the skin every 30 (thirty) days., Disp: 1 Syringe, Rfl: 0 .  Insulin Glargine (LANTUS SOLOSTAR) 100 UNIT/ML Solostar Pen, Inject 40 Units into the skin daily., Disp: 15 pen, Rfl: 11 .  Insulin Pen Needle (ULTICARE SHORT PEN NEEDLES) 31G X 8 MM MISC, USE TO INJECT INSULIN DAILY, Disp: 100 each, Rfl: 2 .  Lancets (ONETOUCH DELICA PLUS 123XX123) MISC, USE TO CHECK BLOOD SUGAR TWO TIMES DAILY, Disp: 100 each, Rfl: 12 .  losartan-hydrochlorothiazide (HYZAAR) 50-12.5 MG tablet, TAKE 1 TABLET BY MOUTH DAILY., Disp: 90 tablet, Rfl: 1 .  omeprazole (PRILOSEC) 20 MG capsule, Take 20 mg by mouth every morning., Disp: , Rfl:  .  OnabotulinumtoxinA (BOTOX IJ), Inject 155 Units as directed every 3 (three) months. , Disp: , Rfl:  .  ONETOUCH VERIO test strip, USE TO CHECK BLOOD SUGAR TWO TIMES A DAY, Disp: 100 strip, Rfl: 12 .  POTASSIUM BICARBONATE PO, Take 595 mg by mouth 1 day or  1 dose. , Disp: , Rfl:  .  promethazine (PHENERGAN) 12.5 MG tablet, Take 12.5 mg by mouth every 6 (six) hours as needed for nausea or vomiting., Disp: , Rfl:  .  Ubrogepant (UBRELVY) 100 MG TABS, Take 1 tablet by mouth as needed (May repeat dose after 2 hours if needed.  Maximum 2 tablets in 24 hours)., Disp: 10 tablet, Rfl: 3   Observations/Objective: Blood pressure 128/70, pulse 99, temperature (!) 97.4 F (36.3 C), height 5' 0.05" (1.525 m), weight 157 lb 6.4 oz (71.4 kg), SpO2 98 %.  Physical Exam Constitutional:      General: She is not in acute distress.    Appearance: Normal appearance. She is well-developed. She is not ill-appearing or toxic-appearing.  HENT:     Head: Normocephalic.     Right Ear: Hearing, tympanic membrane, ear canal and external ear normal. Tympanic membrane is not erythematous, retracted or bulging.  Left Ear: Hearing, tympanic membrane, ear canal and external ear normal. Tympanic membrane is not erythematous, retracted or bulging.     Nose: No mucosal edema or rhinorrhea.     Right Sinus: No maxillary sinus tenderness or frontal sinus tenderness.     Left Sinus: No maxillary sinus tenderness or frontal sinus tenderness.     Mouth/Throat:     Pharynx: Uvula midline.  Eyes:     General: Lids are normal. Lids are everted, no foreign bodies appreciated.     Conjunctiva/sclera: Conjunctivae normal.     Pupils: Pupils are equal, round, and reactive to light.  Neck:     Thyroid: No thyroid mass or thyromegaly.     Vascular: No carotid bruit.     Trachea: Trachea normal.  Cardiovascular:     Rate and Rhythm: Normal rate and regular rhythm.     Pulses: Normal pulses.     Heart sounds: Normal heart sounds, S1 normal and S2 normal. No murmur. No friction rub. No gallop.   Pulmonary:     Effort: Pulmonary effort is normal. No tachypnea or respiratory distress.     Breath sounds: Normal breath sounds. No decreased breath sounds, wheezing, rhonchi or rales.   Abdominal:     General: Bowel sounds are normal.     Palpations: Abdomen is soft.     Tenderness: There is no abdominal tenderness.  Musculoskeletal:     Cervical back: Normal range of motion and neck supple.  Skin:    General: Skin is warm and dry.     Findings: No rash.  Neurological:     Mental Status: She is alert.  Psychiatric:        Mood and Affect: Mood is not anxious or depressed.        Speech: Speech normal.        Behavior: Behavior normal. Behavior is cooperative.        Thought Content: Thought content normal.        Judgment: Judgment normal.     Comments:  During first half of meeting pt stuttering continually, gradulaly as appt progressed stuttering stopped.      Diabetic foot exam: Normal inspection No skin breakdown No calluses  Normal DP pulses Normal sensation to light touch and monofilament Nails normal  Assessment and Plan   Hyperlipidemia Low cholesterol diet , welchiol and max atorvastatin. Has not tolerated crestor or zetia in past. Has improved some but not  At goal.  Will await cardiac recommendations for additional med..  Diabetes mellitus with no complication (H[facility])  Improving control but not at goal A1C... home numbers are better in last month.  Continue current regimen.  DOE (dyspnea on exertion) Recent CT and doppler neg.  EKG with tachycardia and possible past MI.  Now seeing cardiology and has upcoming stress test.  Stuttering  Intermittant nature.. suggests psychological cause, but pt denies anxiety.  Will have her see our embedded psychologist to make sure it is not due to medicaiton interaction or SE, ie gabapentin. She will also follow up with neurpo to consider late effect of chronic migraine.     Eliezer Lofts, MD

## 2019-07-13 NOTE — Assessment & Plan Note (Signed)
Recent CT and doppler neg.  EKG with tachycardia and possible past MI.  Now seeing cardiology and has upcoming stress test.

## 2019-07-17 ENCOUNTER — Ambulatory Visit
Admission: RE | Admit: 2019-07-17 | Discharge: 2019-07-17 | Disposition: A | Discharge status: Home / Self Care | Payer: Medicare Other | Source: Ambulatory Visit | Attending: Cardiology | Admitting: Cardiology

## 2019-07-17 ENCOUNTER — Other Ambulatory Visit: Payer: Self-pay

## 2019-07-17 DIAGNOSIS — R0600 Dyspnea, unspecified: Secondary | ICD-10-CM

## 2019-07-17 DIAGNOSIS — R0609 Other forms of dyspnea: Secondary | ICD-10-CM

## 2019-07-17 LAB — NM MYOCAR MULTI W/SPECT W/WALL MOTION / EF
LV dias vol: 19 mL (ref 46–106)
LV sys vol: 7 mL
Peak HR: 120 {beats}/min
Rest HR: 78 {beats}/min
TID: 0.94

## 2019-07-17 MED ORDER — TECHNETIUM TC 99M TETROFOSMIN IV KIT
10.0000 | PACK | Freq: Once | INTRAVENOUS | Status: AC | PRN
  Administered 2019-07-17: 10.9 via INTRAVENOUS

## 2019-07-17 MED ORDER — REGADENOSON 0.4 MG/5ML IV SOLN
0.4000 mg | Freq: Once | INTRAVENOUS | Status: AC
  Administered 2019-07-17: 0.4 mg via INTRAVENOUS

## 2019-07-17 MED ORDER — TECHNETIUM TC 99M TETROFOSMIN IV KIT
30.0000 | PACK | Freq: Once | INTRAVENOUS | Status: AC | PRN
  Administered 2019-07-17: 30.168 via INTRAVENOUS

## 2019-07-20 ENCOUNTER — Other Ambulatory Visit: Payer: Self-pay | Admitting: Family Medicine

## 2019-07-20 ENCOUNTER — Ambulatory Visit: Payer: Medicare Other

## 2019-07-20 DIAGNOSIS — G43709 Chronic migraine without aura, not intractable, without status migrainosus: Secondary | ICD-10-CM

## 2019-07-20 DIAGNOSIS — F8081 Childhood onset fluency disorder: Secondary | ICD-10-CM

## 2019-07-20 DIAGNOSIS — IMO0002 Reserved for concepts with insufficient information to code with codable children: Secondary | ICD-10-CM

## 2019-07-20 DIAGNOSIS — E119 Type 2 diabetes mellitus without complications: Secondary | ICD-10-CM

## 2019-07-23 ENCOUNTER — Other Ambulatory Visit: Payer: Self-pay

## 2019-07-23 ENCOUNTER — Encounter: Payer: Self-pay | Admitting: Cardiology

## 2019-07-23 ENCOUNTER — Ambulatory Visit (INDEPENDENT_AMBULATORY_CARE_PROVIDER_SITE_OTHER): Payer: Medicare Other | Admitting: Cardiology

## 2019-07-23 VITALS — BP 140/70 | HR 108 | Ht 61.5 in | Wt 159.5 lb

## 2019-07-23 DIAGNOSIS — I10 Essential (primary) hypertension: Secondary | ICD-10-CM | POA: Diagnosis not present

## 2019-07-23 DIAGNOSIS — R0600 Dyspnea, unspecified: Secondary | ICD-10-CM

## 2019-07-23 DIAGNOSIS — E7800 Pure hypercholesterolemia, unspecified: Secondary | ICD-10-CM

## 2019-07-23 DIAGNOSIS — R0609 Other forms of dyspnea: Secondary | ICD-10-CM

## 2019-07-23 NOTE — Patient Instructions (Signed)
Medication Instructions:  Your physician recommends that you continue on your current medications as directed. Please refer to the Current Medication list given to you today.  *If you need a refill on your cardiac medications before your next appointment, please call your pharmacy*  Lab Work: None ordered If you have labs (blood work) drawn today and your tests are completely normal, you will receive your results only by: . MyChart Message (if you have MyChart) OR . A paper copy in the mail If you have any lab test that is abnormal or we need to change your treatment, we will call you to review the results.  Testing/Procedures: None ordered  Follow-Up: At [facility], you and your health needs are our priority.  As part of our continuing mission to provide you with exceptional heart care, we have created designated Provider Care Teams.  These Care Teams include your primary Cardiologist (physician) and Advanced Practice Providers (APPs -  Physician Assistants and Nurse Practitioners) who all work together to provide you with the care you need, when you need it.  Your next appointment:   As needed  The format for your next appointment:   In Person  Provider:    You may see Dr. Agbor- Etang or one of the following Advanced Practice Providers on your designated Care Team:    Christopher Berge, NP  Ryan Dunn, PA-C  Jacquelyn Visser, PA-C   Other Instructions N/A  

## 2019-07-23 NOTE — Progress Notes (Signed)
Cardiology Office Note:    Date:  07/23/2019   ID:  Debra Leach, DOB 07/27/1950, MRN [id number]  PCP:  [name], MD  Cardiologist:  No primary care provider on file.  Electrophysiologist:  None   Referring MD: [name], MD   Chief Complaint  Patient presents with  . other    2 week follow up and discuss myoview results. Pt. c/o shortness of breath when bending over to tie her shoes.     History of Present Illness:    Debra Leach is a 69 y.o. female with a hx of diabetes, hyperlipidemia, DVT/PE on Eliquis who presents for follow-up.  She was last seen due to dyspnea on exertion for about 3 months.    She denied chest pain palpitations, edema, orthopnea.  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  Is being seen by hematology.  An echocardiogram in September 2020 showed normal ejection fraction, mild LVH, EF 70 to 75%, impaired relaxation.  She is a former smoker for about 12 years.  A Lexiscan myocardial perfusion imaging stress test was ordered.  Patient now presents for results.  She states feeling much better, her symptoms of shortness of breath have greatly improved.   Past Medical History:  Diagnosis Date  . Anemia   . Arthritis    osteoarthritis B knees, left hip , right elbow  . Clotting disorder ([place])   . Complication of anesthesia    difficult waking  . Diabetes mellitus without complication ([place])    Diet and exercise controlled  . GERD (gastroesophageal reflux disease)   . Headache   . History of kidney stones   . Hyperlipidemia   . MVP (mitral valve prolapse)    history of  . PONV (postoperative nausea and vomiting)     Past Surgical History:  Procedure Laterality Date  . arm surgery    . BREAST BIOPSY Left   . BREAST SURGERY  2000   breast biopsy (benign)  . LITHOTRIPSY  08-12-2008   stent placed bilaterally  . PARTIAL HYSTERECTOMY     Both ovaries remain, vaginal, for mennorhagia  . TONSILLECTOMY    . TUBAL  LIGATION  1980    Current Medications: Current Meds  Medication Sig  . apixaban (ELIQUIS) 5 MG TABS tablet Take 1 tablet (5 mg total) by mouth 2 (two) times daily.  Marland Kitchen atorvastatin (LIPITOR) 80 MG tablet TAKE 1 TABLET BY MOUTH DAILY.  . budesonide (ENTOCORT EC) 3 MG 24 hr capsule Take 6 mg by mouth daily.   . cholecalciferol (VITAMIN D) 1000 units tablet Take 1,000 Units by mouth daily.  . colesevelam (WELCHOL) 625 MG tablet TAKE 3 TABLETS BY MOUTH 2 TIMES DAILY WITH A MEAL.  Marland Kitchen gabapentin (NEURONTIN) 100 MG capsule TAKE 1 CAPSULE BY MOUTH UP TO 3 TIMES DAILY AS NEEDED.  Marland Kitchen gabapentin (NEURONTIN) 300 MG capsule TAKE 1 CAPSULE BY MOUTH 2 TIMES DAILY.  . Galcanezumab-gnlm (EMGALITY) 120 MG/ML SOSY Inject 120 mg into the skin every 30 (thirty) days.  . Insulin Glargine (LANTUS SOLOSTAR) 100 UNIT/ML Solostar Pen Inject 40 Units into the skin daily.  . Insulin Pen Needle (ULTICARE SHORT PEN NEEDLES) 31G X 8 MM MISC USE TO INJECT INSULIN DAILY  . Lancets (ONETOUCH DELICA PLUS 123XX123) MISC USE TO CHECK BLOOD SUGAR TWO TIMES DAILY  . losartan-hydrochlorothiazide (HYZAAR) 50-12.5 MG tablet TAKE 1 TABLET BY MOUTH DAILY.  Marland Kitchen omeprazole (PRILOSEC) 20 MG capsule Take 20 mg by mouth  every morning.  . OnabotulinumtoxinA (BOTOX IJ) Inject 155 Units as directed every 3 (three) months.   Glory Rosebush VERIO test strip USE TO CHECK BLOOD SUGAR TWO TIMES A DAY  . potassium gluconate 595 (99 K) MG TABS tablet 595 mg daily.   . promethazine (PHENERGAN) 12.5 MG tablet Take 12.5 mg by mouth every 6 (six) hours as needed for nausea or vomiting.  Marland Kitchen Ubrogepant (UBRELVY) 100 MG TABS Take 1 tablet by mouth as needed (May repeat dose after 2 hours if needed.  Maximum 2 tablets in 24 hours).     Allergies:   Atenolol, Codeine, Penicillins, Pentazocine lactate, Propranolol, Crestor [rosuvastatin], and Sudafed [pseudoephedrine hcl]   Social History   Socioeconomic History  . Marital status: Married    Spouse name: Carloyn Manner    . Number of children: 0  . Years of education: Not on file  . Highest education level: Not on file  Occupational History  . Occupation: retired Energy manager: retired  Tobacco Use  . Smoking status: Former Research scientist (life sciences)  . Smokeless tobacco: Never Used  Substance and Sexual Activity  . Alcohol use: No  . Drug use: No  . Sexual activity: Not on file  Other Topics Concern  . Not on file  Social History Narrative   Regular exercise--yes, recumbent bike 3-4 days a week      Diet: fruits and veggies, water, eats at home, drinks a lot of milk      Patient is right-handed. She drinks 1-2 cups of coffee a day.      One story home   Social Determinants of Health   Financial Resource Strain: Low Risk   . Difficulty of Paying Living Expenses: Not hard at all  Food Insecurity: No Food Insecurity  . Worried About Charity fundraiser in the Last Year: Never true  . Ran Out of Food in the Last Year: Never true  Transportation Needs: No Transportation Needs  . Lack of Transportation (Medical): No  . Lack of Transportation (Non-Medical): No  Physical Activity: Inactive  . Days of Exercise per Week: 0 days  . Minutes of Exercise per Session: 0 min  Stress: No Stress Concern Present  . Feeling of Stress : Not at all  Social Connections:   . Frequency of Communication with Friends and Family: Not on file  . Frequency of Social Gatherings with Friends and Family: Not on file  . Attends Religious Services: Not on file  . Active Member of Clubs or Organizations: Not on file  . Attends Archivist Meetings: Not on file  . Marital Status: Not on file     Family History: The patient's family history includes Aortic aneurysm in her brother; Arthritis in her brother and brother; Asthma in her brother and brother; Breast cancer in her maternal aunt and maternal aunt; Cancer in her mother; Hypertension in her father; Mitral valve prolapse in her father; Nephrolithiasis in her  brother and brother.  ROS:   Please see the history of present illness.     All other systems reviewed and are negative.  EKGs/Labs/Other Studies Reviewed:    The following studies were reviewed today:   EKG:  EKG was not obtained today.  Recent Labs: 03/01/2019: B Natriuretic Peptide 29.7 06/19/2019: Hemoglobin 13.4; Platelets 372 07/10/2019: ALT 17; BUN 21; Creatinine, Ser 1.17; Potassium 3.9; Sodium 142  Recent Lipid Panel    Component Value Date/Time   CHOL 234 (H) 07/10/2019 [id number]  TRIG 175.0 (H) 07/10/2019 0833   HDL 53.70 07/10/2019 0833   CHOLHDL 4 07/10/2019 0833   VLDL 35.0 07/10/2019 0833   LDLCALC 145 (H) 07/10/2019 0833   LDLDIRECT 183.0 01/08/2019 0940    Physical Exam:    VS:  BP 140/70 (BP Location: Left Arm, Patient Position: Sitting, Cuff Size: Normal)   Pulse (!) 108   Ht 5' 1.5" (1.562 m)   Wt 159 lb 8 oz (72.3 kg)   BMI 29.65 kg/m     Wt Readings from Last 3 Encounters:  07/23/19 159 lb 8 oz (72.3 kg)  07/13/19 157 lb 6.4 oz (71.4 kg)  07/09/19 153 lb (69.4 kg)     GEN:  Well nourished, well developed in no acute distress HEENT: Normal NECK: No JVD; No carotid bruits LYMPHATICS: No lymphadenopathy CARDIAC: RRR, no murmurs, rubs, gallops RESPIRATORY:  Clear to auscultation without rales, wheezing or rhonchi  ABDOMEN: Soft, non-tender, non-distended MUSCULOSKELETAL:  No edema; No deformity  SKIN: Warm and dry NEUROLOGIC:  Alert and oriented x 3 PSYCHIATRIC:  Normal affect   ASSESSMENT:    1. Dyspnea on exertion   2. Pure hypercholesterolemia   3. Essential hypertension    PLAN:    In order of problems listed above:  1. Patient with dyspnea on exertion which is much improved.  Last echocardiogram showed normal EF with impaired relaxation.  Lexiscan stress test with no evidence for ischemia.  With normal stress test and echocardiogram, cardiac etiology is less likely.  Patient reassured. 2. Hyperlipidemia, her 10-year ASCVD risk is 13.   Last LDL is 148 on high intensity statin of Lipitor 80.  LDL improved to 148.  Patient counseled on low-cholesterol diet.  She plans to follow-up with PCP regarding hyperlipidemia management.  Recommend PCSK9 if lipids are not controlled on current dose of statin and dietary changes. 3. Blood pressure reasonably controlled.  Continue current BP meds.  Follow-up as needed  This note was generated in part or whole with voice recognition software. Voice recognition is usually quite accurate but there are transcription errors that can and very often do occur. I apologize for any typographical errors that were not detected and corrected.  Medication Adjustments/Labs and Tests Ordered: Current medicines are reviewed at length with the patient today.  Concerns regarding medicines are outlined above.  No orders of the defined types were placed in this encounter.  No orders of the defined types were placed in this encounter.   Patient Instructions  Medication Instructions:  Your physician recommends that you continue on your current medications as directed. Please refer to the Current Medication list given to you today.  *If you need a refill on your cardiac medications before your next appointment, please call your pharmacy*  Lab Work: None ordered If you have labs (blood work) drawn today and your tests are completely normal, you will receive your results only by: Marland Kitchen MyChart Message (if you have MyChart) OR . A paper copy in the mail If you have any lab test that is abnormal or we need to change your treatment, we will call you to review the results.  Testing/Procedures: None ordered  Follow-Up: At [facility], you and your health needs are our priority.  As part of our continuing mission to provide you with exceptional heart care, we have created designated Provider Care Teams.  These Care Teams include your primary Cardiologist (physician) and Advanced Practice Providers (APPs -   Physician Assistants and Nurse Practitioners) who all work  together to provide you with the care you need, when you need it.  Your next appointment:   As needed   The format for your next appointment:   In Person  Provider:    You may see  Dr. Garen Lah or one of the following Advanced Practice Providers on your designated Care Team:    Murray Hodgkins, NP  Christell Faith, PA-C  Marrianne Mood, PA-C   Other Instructions N/A     Signed, Kate Sable, MD  07/23/2019 9:35 AM    [place]

## 2019-08-01 ENCOUNTER — Encounter: Payer: Self-pay | Admitting: *Deleted

## 2019-08-01 NOTE — Progress Notes (Signed)
Metta Clines  [facility] Address:  [place] [address] Date: 08/01/2019 To: Metta Clines  From: [facility] Phone: [phone] Reference #: [id number] RE: Prior Authorization Request Patient Name: Debra Leach Patient DOB: 06-13-1950 Patient ID: [id number] Status of Request: Approve Medication Name: Botox Inj 200unit GPI/NDC: [id number] Decision Notes: BOTOX INJ 200UNIT, use as directed, is approved through 10/29/2019 under your Medicare Part D benefit. Reviewed by: System If the treating physician would like to discuss this coverage decision with the physician or health care professional reviewer, please call [facility] Prior Authorization department at [phone].

## 2019-08-01 NOTE — Progress Notes (Addendum)
Gwenlyn Found Key: Ronnette Hila - PA Case ID: [id number] Need help? Call us at [phone] Outcome Approvedtoday Request Reference Number: [id number]. BOTOX INJ 200UNIT is approved through 10/29/2019. Your patient may now fill this prescription and it will be covered. Drug Botox 200UNIT IJ SOLR Form [facility] Medicare Part D Electronic Prior Authorization Form (2017 NCPDP)

## 2019-08-24 ENCOUNTER — Ambulatory Visit (INDEPENDENT_AMBULATORY_CARE_PROVIDER_SITE_OTHER): Payer: Medicare Other | Admitting: Neurology

## 2019-08-24 ENCOUNTER — Other Ambulatory Visit: Payer: Self-pay

## 2019-08-24 DIAGNOSIS — G43709 Chronic migraine without aura, not intractable, without status migrainosus: Secondary | ICD-10-CM | POA: Diagnosis not present

## 2019-08-24 MED ORDER — ONABOTULINUMTOXINA 100 UNITS IJ SOLR
155.0000 [IU] | Freq: Once | INTRAMUSCULAR | Status: AC
  Administered 2019-08-24: 155 [IU] via INTRAMUSCULAR

## 2019-08-24 NOTE — Progress Notes (Signed)
[facility]   Procedure Note Botox  Attending: Dr. Yaira Bernardi  Preoperative Diagnosis(es): Chronic migraine  Consent obtained from: The patient Benefits discussed included, but were not limited to decreased muscle tightness, increased joint range of motion, and decreased pain.  Risk discussed included, but were not limited pain and discomfort, bleeding, bruising, excessive weakness, venous thrombosis, muscle atrophy and dysphagia.  Anticipated outcomes of the procedure as well as he risks and benefits of the alternatives to the procedure, and the roles and tasks of the personnel to be involved, were discussed with the patient, and the patient consents to the procedure and agrees to proceed. A copy of the patient medication guide was given to the patient which explains the blackbox warning.  Patients identity and treatment sites confirmed Yes.  .  Details of Procedure: Skin was cleaned with alcohol. Prior to injection, the needle plunger was aspirated to make sure the needle was not within a blood vessel.  There was no blood retrieved on aspiration.    Following is a summary of the muscles injected  And the amount of Botulinum toxin used:  Dilution 200 units of Botox was reconstituted with 4 ml of preservative free normal saline. Time of reconstitution: At the time of the office visit (<30 minutes prior to injection)   Injections  155 total units of Botox was injected with a 30 gauge needle.  Injection Sites: L occipitalis: 15 units- 3 sites  R occiptalis: 15 units- 3 sites  L upper trapezius: 15 units- 3 sites R upper trapezius: 15 units- 3 sits          L paraspinal: 10 units- 2 sites R paraspinal: 10 units- 2 sites  Face L frontalis(2 injection sites):10 units   R frontalis(2 injection sites):10 units         L corrugator: 5 units   R corrugator: 5 units           Procerus: 5 units   L temporalis: 20 units R temporalis: 20 units   Agent:  200 units of botulinum Type  A (Onobotulinum Toxin type A) was reconstituted with 4 ml of preservative free normal saline.  Time of reconstitution: At the time of the office visit (<30 minutes prior to injection)     Total injected (Units): 155  Total wasted (Units): none wasted  Patient tolerated procedure well without complications.   Reinjection is anticipated in 3 months.    

## 2019-08-31 ENCOUNTER — Other Ambulatory Visit: Payer: Self-pay

## 2019-08-31 ENCOUNTER — Inpatient Hospital Stay: Payer: Medicare Other | Attending: Oncology

## 2019-08-31 DIAGNOSIS — Z7952 Long term (current) use of systemic steroids: Secondary | ICD-10-CM | POA: Insufficient documentation

## 2019-08-31 DIAGNOSIS — Z7901 Long term (current) use of anticoagulants: Secondary | ICD-10-CM | POA: Insufficient documentation

## 2019-08-31 DIAGNOSIS — Z86711 Personal history of pulmonary embolism: Secondary | ICD-10-CM | POA: Diagnosis not present

## 2019-08-31 DIAGNOSIS — Z79899 Other long term (current) drug therapy: Secondary | ICD-10-CM | POA: Insufficient documentation

## 2019-08-31 DIAGNOSIS — Z794 Long term (current) use of insulin: Secondary | ICD-10-CM | POA: Insufficient documentation

## 2019-08-31 DIAGNOSIS — R000 Tachycardia, unspecified: Secondary | ICD-10-CM | POA: Diagnosis not present

## 2019-08-31 DIAGNOSIS — Z803 Family history of malignant neoplasm of breast: Secondary | ICD-10-CM | POA: Insufficient documentation

## 2019-08-31 DIAGNOSIS — Z808 Family history of malignant neoplasm of other organs or systems: Secondary | ICD-10-CM | POA: Diagnosis not present

## 2019-08-31 DIAGNOSIS — Z86718 Personal history of other venous thrombosis and embolism: Secondary | ICD-10-CM | POA: Diagnosis not present

## 2019-08-31 DIAGNOSIS — E119 Type 2 diabetes mellitus without complications: Secondary | ICD-10-CM | POA: Diagnosis not present

## 2019-08-31 DIAGNOSIS — Z87891 Personal history of nicotine dependence: Secondary | ICD-10-CM | POA: Insufficient documentation

## 2019-08-31 LAB — COMPREHENSIVE METABOLIC PANEL
ALT: 24 U/L (ref 0–44)
AST: 19 U/L (ref 15–41)
Albumin: 3.3 g/dL — ABNORMAL LOW (ref 3.5–5.0)
Alkaline Phosphatase: 80 U/L (ref 38–126)
Anion gap: 10 (ref 5–15)
BUN: 17 mg/dL (ref 8–23)
CO2: 27 mmol/L (ref 22–32)
Calcium: 8.9 mg/dL (ref 8.9–10.3)
Chloride: 101 mmol/L (ref 98–111)
Creatinine, Ser: 1.18 mg/dL — ABNORMAL HIGH (ref 0.44–1.00)
GFR calc Af Amer: 55 mL/min — ABNORMAL LOW (ref >60)
GFR calc non Af Amer: 47 mL/min — ABNORMAL LOW (ref >60)
Glucose, Bld: 238 mg/dL — ABNORMAL HIGH (ref 70–99)
Potassium: 3.6 mmol/L (ref 3.5–5.1)
Sodium: 138 mmol/L (ref 135–145)
Total Bilirubin: 0.5 mg/dL (ref 0.3–1.2)
Total Protein: 7.4 g/dL (ref 6.5–8.1)

## 2019-08-31 LAB — CBC WITH DIFFERENTIAL/PLATELET
Abs Immature Granulocytes: 0.12 10*3/uL — ABNORMAL HIGH (ref 0.00–0.07)
Basophils Absolute: 0.1 10*3/uL (ref 0.0–0.1)
Basophils Relative: 1 %
Eosinophils Absolute: 0.1 10*3/uL (ref 0.0–0.5)
Eosinophils Relative: 1 %
HCT: 38.8 % (ref 36.0–46.0)
Hemoglobin: 12.6 g/dL (ref 12.0–15.0)
Immature Granulocytes: 1 %
Lymphocytes Relative: 8 %
Lymphs Abs: 1.1 10*3/uL (ref 0.7–4.0)
MCH: 28.1 pg (ref 26.0–34.0)
MCHC: 32.5 g/dL (ref 30.0–36.0)
MCV: 86.4 fL (ref 80.0–100.0)
Monocytes Absolute: 0.7 10*3/uL (ref 0.1–1.0)
Monocytes Relative: 5 %
Neutro Abs: 10.6 10*3/uL — ABNORMAL HIGH (ref 1.7–7.7)
Neutrophils Relative %: 84 %
Platelets: 363 10*3/uL (ref 150–400)
RBC: 4.49 MIL/uL (ref 3.87–5.11)
RDW: 15.3 % (ref 11.5–15.5)
WBC: 12.6 10*3/uL — ABNORMAL HIGH (ref 4.0–10.5)
nRBC: 0 % (ref 0.0–0.2)

## 2019-08-31 LAB — FIBRIN DERIVATIVES D-DIMER (ARMC ONLY): Fibrin derivatives D-dimer (ARMC): 327.85 ng/mL (FEU) (ref 0.00–499.00)

## 2019-09-03 ENCOUNTER — Encounter: Payer: Self-pay | Admitting: Oncology

## 2019-09-03 ENCOUNTER — Inpatient Hospital Stay (HOSPITAL_BASED_OUTPATIENT_CLINIC_OR_DEPARTMENT_OTHER): Payer: Medicare Other | Admitting: Oncology

## 2019-09-03 ENCOUNTER — Other Ambulatory Visit: Payer: Self-pay

## 2019-09-03 VITALS — BP 132/79 | HR 103 | Temp 97.2°F | Resp 18 | Wt 159.2 lb

## 2019-09-03 DIAGNOSIS — R0600 Dyspnea, unspecified: Secondary | ICD-10-CM | POA: Diagnosis not present

## 2019-09-03 DIAGNOSIS — N63 Unspecified lump in unspecified breast: Secondary | ICD-10-CM

## 2019-09-03 DIAGNOSIS — R0609 Other forms of dyspnea: Secondary | ICD-10-CM

## 2019-09-03 DIAGNOSIS — Z86711 Personal history of pulmonary embolism: Secondary | ICD-10-CM | POA: Diagnosis not present

## 2019-09-03 DIAGNOSIS — R000 Tachycardia, unspecified: Secondary | ICD-10-CM

## 2019-09-03 DIAGNOSIS — Z86718 Personal history of other venous thrombosis and embolism: Secondary | ICD-10-CM

## 2019-09-03 NOTE — Progress Notes (Signed)
Patient reports having the same symptoms now as she was having at initial diagnosis of blood clots:  Leg weakness, difficulty talking and breathing, bilateral calf pain.  Also 1 week ago she woke up in the middle of the night with right side back pain that lasted about 1 hour.

## 2019-09-03 NOTE — Progress Notes (Signed)
Hematology/Oncology follow up  note [facility] [phone] [phone]   Patient Care Team: [name], MD as PCP - General (Family Medicine) Pieter Partridge, DO as Consulting Physician (Neurology)  REFERRING PROVIDER: [name], MD  CHIEF COMPLAINTS/REASON FOR VISIT:  Evaluation of pulmonary embolism.   HISTORY OF PRESENTING ILLNESS:  Debra Leach is a 69 y.o. female who was seen in consultation at the request of Diona Browner, Mervyn Gay, MD for evaluation of pulmonary embolism.  Patient was diagnosed with acute pulmonary embolism on 03/01/2019.Marland Kitchen Patient's presenting symptoms included: Dyspnea with exertion, lower extremity cramps.  At that time she also had hematuria. CT angio chest showed acute bilateral lower lobe and a right upper lobe segmental and subsegmental pulmonary emboli. Also noticed 1.2 cm left breast nodule. US venous duplex: Showed right lower extremity acute DVT involving right popliteal vein, right posterior tibial veins, and right peroneal veins. No cystic structure found in the popliteal fossa.  Left lower extremity no DVT .  Immobilization factors: Denies any immobilization factors including car ride, air flight, surgery, trauma. History of previous thrombosis event: Denies Family history of thrombosis: Denies any  Age appropriate cancer screening: Patient is up-to-date for mammogram on 02/01/2019 which showed no significant findings.  Colonoscopy 12/07/2012 which showed no colorectal neoplasm.  Internal hemorrhoids.  12/26/2014 EGD showed normal findings.  Patient was started on heparin for anticoagulation during admission and transition to Eliquis 10 mg twice daily for a week and transitioned to Eliquis 5 mg twice daily Patient reports that she had finished Eliquis 10 mg twice daily, and for 3 to 4 days, she misunderstood the instruction at took Eliquis 5 mg daily.  She realized that and started taking Eliquis 5 mg  twice daily and has been on current regimen for more than a week. She reports overall that her shortness of breath has significantly improved.  However she did notice yesterday, after walking a few laps, she felt winded.  Lower extremity cramp has completely resolved.  She lives with her husband at home, taking care of her mother.  Admits to some stress in life.  She also feels anxious coming to the cancer center today. She measures her blood pressure and heart rate at home and both were normal this morning. Today in the clinic, her blood pressure is slightly high 151/84, with a pulse rate of 117.  Denies any palpitation or chest pain.    INTERVAL HISTORY Debra Leach is a 69 y.o. female who has above history reviewed by me today presents for follow up visit for management of chronic anticoagulation for history of acute PE and lower extremity DVT. Problems and complaints are listed below: Patient has completed about 6 months of anticoagulation with Eliquis 5 mg twice daily. She had a virtual visit with me 3 months ago and she complained persistent shortness of breath at that time.  CT chest angiogram PE protocol was obtained which showed no PE, she had mild increase in size of multiple bilateral hilar lymph nodes most likely reactive.  There was an interval mild increase of left upper outer quadrant breast nodule which has been stable over the past 7 years.  Coronary artery disease. Patient was continued on Eliquis 5 mg twice daily. Today patient reports that after her CT, she gradually feels better in terms of breathing, and 3 weeks ago she recalls a period of 10 days feeling really well, however her breathing symptoms recurred and today she feels shortness  of breath.  Also had hoarseness of voice, intermittent.  Denies any swallowing difficulty. She also complains bilateral calf tenderness. Denies any bleeding events.  Review of Systems  Constitutional: Negative for appetite change,  chills, fatigue and fever.  HENT:   Negative for hearing loss and voice change.   Eyes: Negative for eye problems.  Respiratory: Positive for shortness of breath. Negative for chest tightness and cough.   Cardiovascular: Negative for chest pain.  Gastrointestinal: Negative for abdominal distention, abdominal pain and blood in stool.  Endocrine: Negative for hot flashes.  Genitourinary: Negative for difficulty urinating and frequency.   Musculoskeletal: Negative for arthralgias.       Calf tenderness  Skin: Negative for itching and rash.  Neurological: Negative for extremity weakness.  Hematological: Negative for adenopathy.  Psychiatric/Behavioral: Negative for confusion.    MEDICAL HISTORY:  Past Medical History:  Diagnosis Date  . Anemia   . Arthritis    osteoarthritis B knees, left hip , right elbow  . Clotting disorder ([place])   . Complication of anesthesia    difficult waking  . Diabetes mellitus without complication ([place])    Diet and exercise controlled  . GERD (gastroesophageal reflux disease)   . Headache   . History of kidney stones   . Hyperlipidemia   . MVP (mitral valve prolapse)    history of  . PONV (postoperative nausea and vomiting)     SURGICAL HISTORY: Past Surgical History:  Procedure Laterality Date  . arm surgery    . BREAST BIOPSY Left   . BREAST SURGERY  2000   breast biopsy (benign)  . LITHOTRIPSY  08-12-2008   stent placed bilaterally  . PARTIAL HYSTERECTOMY     Both ovaries remain, vaginal, for mennorhagia  . TONSILLECTOMY    . TUBAL LIGATION  1980    SOCIAL HISTORY: Social History   Socioeconomic History  . Marital status: Married    Spouse name: Carloyn Manner  . Number of children: 0  . Years of education: Not on file  . Highest education level: Not on file  Occupational History  . Occupation: retired Energy manager: retired  Tobacco Use  . Smoking status: Former Research scientist (life sciences)  . Smokeless tobacco: Never Used  Substance and Sexual  Activity  . Alcohol use: No  . Drug use: No  . Sexual activity: Not on file  Other Topics Concern  . Not on file  Social History Narrative   Regular exercise--yes, recumbent bike 3-4 days a week      Diet: fruits and veggies, water, eats at home, drinks a lot of milk      Patient is right-handed. She drinks 1-2 cups of coffee a day.      One story home   Social Determinants of Health   Financial Resource Strain: Low Risk   . Difficulty of Paying Living Expenses: Not hard at all  Food Insecurity: No Food Insecurity  . Worried About Charity fundraiser in the Last Year: Never true  . Ran Out of Food in the Last Year: Never true  Transportation Needs: No Transportation Needs  . Lack of Transportation (Medical): No  . Lack of Transportation (Non-Medical): No  Physical Activity: Inactive  . Days of Exercise per Week: 0 days  . Minutes of Exercise per Session: 0 min  Stress: No Stress Concern Present  . Feeling of Stress : Not at all  Social Connections:   . Frequency of Communication with Friends and Family:   .  Frequency of Social Gatherings with Friends and Family:   . Attends Religious Services:   . Active Member of Clubs or Organizations:   . Attends Archivist Meetings:   Marland Kitchen Marital Status:   Intimate Partner Violence: Not At Risk  . Fear of Current or Ex-Partner: No  . Emotionally Abused: No  . Physically Abused: No  . Sexually Abused: No    FAMILY HISTORY: Family History  Problem Relation Age of Onset  . Cancer Mother        bone  . Hypertension Father   . Mitral valve prolapse Father   . Asthma Brother   . Arthritis Brother   . Nephrolithiasis Brother   . Nephrolithiasis Brother   . Asthma Brother   . Arthritis Brother   . Aortic aneurysm Brother        ascending aortic aneuysm  . Breast cancer Maternal Aunt   . Breast cancer Maternal Aunt     ALLERGIES:  is allergic to atenolol; codeine; penicillins; pentazocine lactate; propranolol; crestor  [rosuvastatin]; and sudafed [pseudoephedrine hcl].  MEDICATIONS:  Current Outpatient Medications  Medication Sig Dispense Refill  . apixaban (ELIQUIS) 5 MG TABS tablet Take 1 tablet (5 mg total) by mouth 2 (two) times daily. 180 tablet 1  . atorvastatin (LIPITOR) 80 MG tablet TAKE 1 TABLET BY MOUTH DAILY. 90 tablet 3  . budesonide (ENTOCORT EC) 3 MG 24 hr capsule Take 6 mg by mouth daily.     . cholecalciferol (VITAMIN D) 1000 units tablet Take 1,000 Units by mouth daily.    . colesevelam (WELCHOL) 625 MG tablet TAKE 3 TABLETS BY MOUTH 2 TIMES DAILY WITH A MEAL. 540 tablet 3  . gabapentin (NEURONTIN) 100 MG capsule TAKE 1 CAPSULE BY MOUTH UP TO 3 TIMES DAILY AS NEEDED. 270 capsule 3  . gabapentin (NEURONTIN) 300 MG capsule TAKE 1 CAPSULE BY MOUTH 2 TIMES DAILY. 180 capsule 0  . Galcanezumab-gnlm (EMGALITY) 120 MG/ML SOSY Inject 120 mg into the skin every 30 (thirty) days. 1 Syringe 0  . Insulin Glargine (LANTUS SOLOSTAR) 100 UNIT/ML Solostar Pen Inject 40 Units into the skin daily. 15 pen 11  . Insulin Pen Needle (ULTICARE SHORT PEN NEEDLES) 31G X 8 MM MISC USE TO INJECT INSULIN DAILY 100 each 2  . Lancets (ONETOUCH DELICA PLUS 123XX123) MISC USE TO CHECK BLOOD SUGAR TWO TIMES DAILY 100 each 12  . losartan-hydrochlorothiazide (HYZAAR) 50-12.5 MG tablet TAKE 1 TABLET BY MOUTH DAILY. 90 tablet 1  . omeprazole (PRILOSEC) 20 MG capsule Take 20 mg by mouth every morning.    . OnabotulinumtoxinA (BOTOX IJ) Inject 155 Units as directed every 3 (three) months.     Glory Rosebush VERIO test strip USE TO CHECK BLOOD SUGAR TWO TIMES A DAY 100 strip 12  . potassium gluconate 595 (99 K) MG TABS tablet 595 mg daily.     . promethazine (PHENERGAN) 12.5 MG tablet Take 12.5 mg by mouth every 6 (six) hours as needed for nausea or vomiting.    Marland Kitchen Ubrogepant (UBRELVY) 100 MG TABS Take 1 tablet by mouth as needed (May repeat dose after 2 hours if needed.  Maximum 2 tablets in 24 hours). 10 tablet 3   No current  facility-administered medications for this visit.     PHYSICAL EXAMINATION: ECOG PERFORMANCE STATUS: 1 - Symptomatic but completely ambulatory Vitals:   09/03/19 1306  BP: 132/79  Pulse: (!) 103  Resp: 18  Temp: (!) 97.2 F (36.2 C)  SpO2: 96%  Filed Weights   09/03/19 1306  Weight: 159 lb 3.2 oz (72.2 kg)    Physical Exam Constitutional:      General: She is not in acute distress. HENT:     Head: Normocephalic and atraumatic.  Eyes:     General: No scleral icterus.    Pupils: Pupils are equal, round, and reactive to light.  Cardiovascular:     Rate and Rhythm: Regular rhythm. Tachycardia present.     Heart sounds: Normal heart sounds.  Pulmonary:     Effort: Pulmonary effort is normal. No respiratory distress.     Breath sounds: No wheezing.  Abdominal:     General: Bowel sounds are normal. There is no distension.     Palpations: Abdomen is soft. There is no mass.     Tenderness: There is no abdominal tenderness.  Musculoskeletal:        General: No deformity. Normal range of motion.     Cervical back: Normal range of motion and neck supple.  Skin:    General: Skin is warm and dry.     Findings: No erythema or rash.  Neurological:     Mental Status: She is alert and oriented to person, place, and time.     Cranial Nerves: No cranial nerve deficit.     Coordination: Coordination normal.  Psychiatric:     Comments: Anxious     RADIOGRAPHIC STUDIES: I have personally reviewed the radiological images as listed and agreed with the findings in the report. No results found.   LABORATORY DATA:  I have reviewed the data as listed Lab Results  Component Value Date   WBC 12.6 (H) 08/31/2019   HGB 12.6 08/31/2019   HCT 38.8 08/31/2019   MCV 86.4 08/31/2019   PLT 363 08/31/2019   Recent Labs    03/03/19 0312 03/21/19 0738 06/19/19 1134 07/10/19 0833 08/31/19 1330  NA 147*   < > 140 142 138  K 3.8   < > 3.7 3.9 3.6  CL 111   < > 102 103 101  CO2 28   <  > 26 30 27   GLUCOSE 69*   < > 160* 123* 238*  BUN 12   < > 17 21 17   CREATININE 1.07*   < > 1.08* 1.17 1.18*  CALCIUM 8.9   < > 8.8* 9.2 8.9  GFRNONAA 54*  --  53*  --  47*  GFRAA >60  --  >60  --  55*  PROT  --    < > 7.4 6.4 7.4  ALBUMIN  --    < > 3.3* 3.5 3.3*  AST  --    < > 24 14 19   ALT  --    < > 30 17 24   ALKPHOS  --    < > 74 73 80  BILITOT  --    < > 0.4 0.4 0.5   < > = values in this interval not displayed.   Iron/TIBC/Ferritin/ %Sat No results found for: IRON, TIBC, FERRITIN, IRONPCTSAT     ASSESSMENT & PLAN:  1. History of pulmonary embolism   2. History of DVT of lower extremity   3. Breast nodule   4. Dyspnea on exertion   5. Tachycardia    #Chronic anticoagulation for history of PE and a DVT. Currently on Eliquis 5 mg twice daily.  She tolerates well. She has finished 6 months of anticoagulation.   My original plan is to decrease to Eliquis 2.5 mg  for maintenance. However patient continues to have shortness of breath on exertion. Shortness of breath appears to be a chronic issue for her, and CT chest angiogram 3 months ago when she had similar complaints showed no pulmonary embolism, indicating Eliquis being an effective anticoagulation measure for her. She also complained lower extremity calf tenderness. I recommend patient to continue Eliquis 5 mg twice daily for now. Overall," suspicion for recurrent DVT and/or PE is low given that patient has been on chronic full-strength anticoagulation, normal D-dimer level. Discussed with patient that shortness of breath may be secondary to other etiologies such as pulmonary hypertension, which have been a consequence due to history of PE, chronic lung condition versus cardiology etiologies. I recommend patient to discuss with primary care provider for pulmonology for further evaluation. Eliquis dose can be decreased in the future once we have a good explanation of shortness of breath.  #Breast nodule which appears to  be stable over many years.  Her mammogram did not mention this breast nodule and I have discussed previously with radiologist.  Breast nodule was not reported due to this is a chronic stable finding.  Continue annual mammogram. She underwent biopsy 20 years ago and was negative is not available in current EMR.  #Tachycardia, patient was recently seen by cardiology.  Recent pharmacological stress test was normal per patient.   All questions were answered. The patient knows to call the clinic with any problems questions or concerns.  Cc Bedsole, Amy E, MD   Return of visit: 3 months Thank you for this kind referral and the opportunity to participate in the care of this patient. A copy of today's note is routed to referring provider    Earlie Server, MD, PhD 09/03/2019

## 2019-09-04 ENCOUNTER — Ambulatory Visit (INDEPENDENT_AMBULATORY_CARE_PROVIDER_SITE_OTHER): Payer: Medicare Other | Admitting: Family Medicine

## 2019-09-04 ENCOUNTER — Encounter: Payer: Self-pay | Admitting: Family Medicine

## 2019-09-04 VITALS — BP 144/68 | HR 92 | Temp 98.3°F | Ht 60.5 in | Wt 159.5 lb

## 2019-09-04 DIAGNOSIS — R000 Tachycardia, unspecified: Secondary | ICD-10-CM

## 2019-09-04 DIAGNOSIS — L570 Actinic keratosis: Secondary | ICD-10-CM | POA: Diagnosis not present

## 2019-09-04 DIAGNOSIS — R0609 Other forms of dyspnea: Secondary | ICD-10-CM

## 2019-09-04 DIAGNOSIS — I700 Atherosclerosis of aorta: Secondary | ICD-10-CM

## 2019-09-04 DIAGNOSIS — E119 Type 2 diabetes mellitus without complications: Secondary | ICD-10-CM

## 2019-09-04 DIAGNOSIS — E1165 Type 2 diabetes mellitus with hyperglycemia: Secondary | ICD-10-CM | POA: Diagnosis not present

## 2019-09-04 DIAGNOSIS — R0600 Dyspnea, unspecified: Secondary | ICD-10-CM

## 2019-09-04 DIAGNOSIS — Z86711 Personal history of pulmonary embolism: Secondary | ICD-10-CM | POA: Diagnosis not present

## 2019-09-04 NOTE — Patient Instructions (Addendum)
Pulmonology will call to set up referral in next few weeks.  Increase  45 Unit Lantus.. then titrate up 2 units every 2 days until FBS < 120.

## 2019-09-04 NOTE — Progress Notes (Addendum)
Chief Complaint  Patient presents with  . Check Spot on Right leg  . 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  1.SOB.Marland Kitchen reviewed OV from 09/03/2019 Oncologist Dr. Tasia Catchings She continues to have SOB as she had initially with PE. Had gotten better in Feb but now worse again.  She is on Eliquis BID now at 6 month point.. had planned decreased to daily, but ONc decided given SOb to continue BID for now 07/02/2019:CT chest angiogram 3 months ago when she had similar complaints showed no pulmonary embolism, indicating Eliquis being an effective anticoagulation measure for her.   CT also showed Stable mild linear scarring at the right lung base and superior aspect of the left upper lobe, medially. Mild biapical pleural and parenchymal scarring. Mild increase in size of multiple bilateral hilar lymph nodes, most likely reactive.   Recommended referral to pulmonary. She saw cardiology on 07/09/2019 Dr. Garen Lah echocardiogram in September 2020 showed normal ejection fraction, mild LVH, EF 70 to 75%, impaired relaxation.  Lexiscan:  Nml EF 65%   History of smoking.  2 DM: Blood sugar control worse in March  FBS 135-194, Occ postprandial > 200  She has gained 8 lbs since 02/2020. She has been unable to exercise unitl last month... but SOB has now limited. Was 116-136 Taking 40 units .    Budesonide may be decreased in next 3 months to 1/2 dose. Wt Readings from Last 3 Encounters:  09/04/19 159 lb 8 oz (72.3 kg)  09/03/19 159 lb 3.2 oz (72.2 kg)  07/23/19 159 lb 8 oz (72.3 kg)        3. Lesion on right lower leg: felt area was AK... Dr C treated with  cryotherapy 05/2020... area never went away. Per pt Dr. Lorelei Pont did not freeze it more than lightly given it was over the bone   This visit occurred during the SARS-CoV-2 public  health emergency.  Safety protocols were in place, including screening questions prior to the visit, additional usage of staff PPE, and extensive cleaning of exam room while observing appropriate contact time as indicated for disinfecting solutions.   COVID 19 screen:  No recent travel or known exposure to COVID19 The patient denies respiratory symptoms of COVID 19 at this time. The importance of social distancing was discussed today.     Review of Systems  Constitutional: Negative for chills and fever.  HENT: Negative for congestion and ear pain.   Eyes: Negative for pain and redness.  Respiratory: Positive for shortness of breath. Negative for cough.   Cardiovascular: Negative for chest pain, palpitations and leg swelling.  Gastrointestinal: Negative for abdominal pain, blood in stool, constipation, diarrhea, nausea and vomiting.  Genitourinary: Negative for dysuria.  Musculoskeletal: Negative for falls and myalgias.  Skin: Negative for rash.  Neurological: Negative for dizziness.      Past Medical History:  Diagnosis Date  . Anemia   . Arthritis    osteoarthritis B knees, left hip , right elbow  . Clotting disorder ([place])   . Complication of anesthesia    difficult waking  . Diabetes mellitus without complication ([place])    Diet and exercise controlled  . GERD (gastroesophageal reflux disease)   . Headache   . History of kidney stones   . Hyperlipidemia   . MVP (mitral valve prolapse)    history of  .  PONV (postoperative nausea and vomiting)     reports that she has quit smoking. She has never used smokeless tobacco. She reports that she does not drink alcohol or use drugs.   Current Outpatient Medications:  .  apixaban (ELIQUIS) 5 MG TABS tablet, Take 1 tablet (5 mg total) by mouth 2 (two) times daily., Disp: 180 tablet, Rfl: 1 .  atorvastatin (LIPITOR) 80 MG tablet, TAKE 1 TABLET BY MOUTH DAILY., Disp: 90 tablet, Rfl: 3 .  budesonide (ENTOCORT EC) 3 MG 24 hr capsule,  Take 6 mg by mouth daily. , Disp: , Rfl:  .  cholecalciferol (VITAMIN D) 1000 units tablet, Take 1,000 Units by mouth daily., Disp: , Rfl:  .  colesevelam (WELCHOL) 625 MG tablet, TAKE 3 TABLETS BY MOUTH 2 TIMES DAILY WITH A MEAL., Disp: 540 tablet, Rfl: 3 .  gabapentin (NEURONTIN) 100 MG capsule, TAKE 1 CAPSULE BY MOUTH UP TO 3 TIMES DAILY AS NEEDED., Disp: 270 capsule, Rfl: 3 .  gabapentin (NEURONTIN) 300 MG capsule, TAKE 1 CAPSULE BY MOUTH 2 TIMES DAILY., Disp: 180 capsule, Rfl: 0 .  Galcanezumab-gnlm (EMGALITY) 120 MG/ML SOSY, Inject 120 mg into the skin every 30 (thirty) days., Disp: 1 Syringe, Rfl: 0 .  Insulin Glargine (LANTUS SOLOSTAR) 100 UNIT/ML Solostar Pen, Inject 40 Units into the skin daily., Disp: 15 pen, Rfl: 11 .  Insulin Pen Needle (ULTICARE SHORT PEN NEEDLES) 31G X 8 MM MISC, USE TO INJECT INSULIN DAILY, Disp: 100 each, Rfl: 2 .  Lancets (ONETOUCH DELICA PLUS 123XX123) MISC, USE TO CHECK BLOOD SUGAR TWO TIMES DAILY, Disp: 100 each, Rfl: 12 .  losartan-hydrochlorothiazide (HYZAAR) 50-12.5 MG tablet, TAKE 1 TABLET BY MOUTH DAILY., Disp: 90 tablet, Rfl: 1 .  omeprazole (PRILOSEC) 20 MG capsule, Take 20 mg by mouth every morning., Disp: , Rfl:  .  OnabotulinumtoxinA (BOTOX IJ), Inject 155 Units as directed every 3 (three) months. , Disp: , Rfl:  .  ONETOUCH VERIO test strip, USE TO CHECK BLOOD SUGAR TWO TIMES A DAY, Disp: 100 strip, Rfl: 12 .  potassium gluconate 595 (99 K) MG TABS tablet, 595 mg daily. , Disp: , Rfl:  .  promethazine (PHENERGAN) 12.5 MG tablet, Take 12.5 mg by mouth every 6 (six) hours as needed for nausea or vomiting., Disp: , Rfl:  .  Ubrogepant (UBRELVY) 100 MG TABS, Take 1 tablet by mouth as needed (May repeat dose after 2 hours if needed.  Maximum 2 tablets in 24 hours)., Disp: 10 tablet, Rfl: 3   Observations/Objective: Blood pressure (!) 144/68, pulse 92, temperature 98.3 F (36.8 C), temperature source Temporal, height 5' 0.5" (1.537 m), weight 159 lb 8 oz  (72.3 kg), SpO2 98 %.   Physical Exam Constitutional:      General: She is not in acute distress.    Appearance: Normal appearance. She is well-developed. She is not ill-appearing or toxic-appearing.  HENT:     Head: Normocephalic.     Right Ear: Hearing, tympanic membrane, ear canal and external ear normal. Tympanic membrane is not erythematous, retracted or bulging.     Left Ear: Hearing, tympanic membrane, ear canal and external ear normal. Tympanic membrane is not erythematous, retracted or bulging.     Nose: No mucosal edema or rhinorrhea.     Right Sinus: No maxillary sinus tenderness or frontal sinus tenderness.     Left Sinus: No maxillary sinus tenderness or frontal sinus tenderness.     Mouth/Throat:     Pharynx: Uvula midline.  Eyes:     General: Lids are normal. Lids are everted, no foreign bodies appreciated.     Conjunctiva/sclera: Conjunctivae normal.     Pupils: Pupils are equal, round, and reactive to light.  Neck:     Thyroid: No thyroid mass or thyromegaly.     Vascular: No carotid bruit.     Trachea: Trachea normal.  Cardiovascular:     Rate and Rhythm: Normal rate and regular rhythm.     Pulses: Normal pulses.     Heart sounds: Normal heart sounds, S1 normal and S2 normal. No murmur. No friction rub. No gallop.   Pulmonary:     Effort: Pulmonary effort is normal. No tachypnea or respiratory distress.     Breath sounds: Normal breath sounds. No decreased breath sounds, wheezing, rhonchi or rales.  Abdominal:     General: Bowel sounds are normal.     Palpations: Abdomen is soft.     Tenderness: There is no abdominal tenderness.  Musculoskeletal:     Cervical back: Normal range of motion and neck supple.  Skin:    General: Skin is warm and dry.     Findings: No rash.          Comments:  Flaky red 1 cm lesion on right anterior calf  Neurological:     Mental Status: She is alert.  Psychiatric:        Mood and Affect: Mood is not anxious or depressed.          Speech: Speech normal.        Behavior: Behavior normal. Behavior is cooperative.        Thought Content: Thought content normal.        Judgment: Judgment normal.      Assessment and Plan   Dyspnea on minimal exertion  Given tachycardia and history of DVT... refer to pulmonary for further evaluation.   Type 2 diabetes mellitus with hyperglycemia (HCC) Increase to  45 Unit Lantus.. then titrate up 2 units every 2 days until FBS < 120. As tolerated increase waling and physical activity.  Aortic atherosclerosis (HCC) Neg cardiac eval as source of SOB. Cardiology note reviewed in detail.  AK (actinic keratosis)  Procedure note:  Patient gave consent for procedure . Notified of potential for scarring at site of cryotherapy. Area of concern treated with cryotherapy for 3 freeze thaw cycles with a 1 mm border of freeze. No complications.     Eliezer Lofts, MD

## 2019-09-11 ENCOUNTER — Encounter: Payer: Self-pay | Admitting: Oncology

## 2019-09-11 ENCOUNTER — Encounter: Payer: Self-pay | Admitting: Internal Medicine

## 2019-09-11 ENCOUNTER — Other Ambulatory Visit: Payer: Self-pay

## 2019-09-11 ENCOUNTER — Ambulatory Visit (INDEPENDENT_AMBULATORY_CARE_PROVIDER_SITE_OTHER): Payer: Medicare Other | Admitting: Internal Medicine

## 2019-09-11 VITALS — BP 144/72 | HR 119 | Temp 97.1°F | Ht 60.0 in | Wt 158.2 lb

## 2019-09-11 DIAGNOSIS — R0600 Dyspnea, unspecified: Secondary | ICD-10-CM | POA: Diagnosis not present

## 2019-09-11 DIAGNOSIS — R599 Enlarged lymph nodes, unspecified: Secondary | ICD-10-CM

## 2019-09-11 DIAGNOSIS — R591 Generalized enlarged lymph nodes: Secondary | ICD-10-CM | POA: Diagnosis not present

## 2019-09-11 DIAGNOSIS — R0609 Other forms of dyspnea: Secondary | ICD-10-CM

## 2019-09-11 MED ORDER — LORAZEPAM 0.5 MG PO TABS: 0.5000 mg | ORAL_TABLET | Freq: Three times a day (TID) | ORAL | 0 refills | Status: DC | PRN

## 2019-09-11 NOTE — Progress Notes (Signed)
Synopsis: DOE  Assessment & Plan:  Problem 1 DOE: PE has resolved.  Lung parenchyma looks good.  Heart looks good on echo.  Walking around office only notable for sustained regular pulse in 130-140 range.  Her resting pulse was in 120s.  With distraction, HR and RR dropped.  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  She admits to a number of stressors related to caring for her 69 year old FIL. 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 I reviewed prior external note(s) from Dr. Tasia Catchings on 09/03/19 and Dr. Earlean Shawl on 08/10/19 . I reviewed the result(s) of echo Sept 2020 normal biventricular function, nuclear stress 07/17/19 (normal) . I have ordered PRN ativan, PFTs, repeat CT chest  Review of patient's CTA chest (07/02/19) images reveal no PE, normal pulmonary trunk, minimal steaky atelectasis, and trivial hilar adenopathy. Right diaphragm mildly elevated.  The patient's images have been independently reviewed by me.    End of visit medications:  Current Outpatient Medications:  .  apixaban (ELIQUIS) 5 MG TABS tablet, Take 1 tablet (5 mg total) by mouth 2 (two) times daily., Disp: 180 tablet, Rfl: 1 .  atorvastatin (LIPITOR) 80 MG tablet, TAKE 1 TABLET BY MOUTH DAILY., Disp: 90 tablet, Rfl: 3 .  budesonide  (ENTOCORT EC) 3 MG 24 hr capsule, Take 6 mg by mouth daily. , Disp: , Rfl:  .  cholecalciferol (VITAMIN D) 1000 units tablet, Take 1,000 Units by mouth daily., Disp: , Rfl:  .  colesevelam (WELCHOL) 625 MG tablet, TAKE 3 TABLETS BY MOUTH 2 TIMES DAILY WITH A MEAL., Disp: 540 tablet, Rfl: 3 .  gabapentin (NEURONTIN) 100 MG capsule, Take 100 mg by mouth 3 (three) times daily between meals as needed., Disp: , Rfl:  .  gabapentin (NEURONTIN) 300 MG capsule, Take 2 capsules by mouth at bedtime., Disp: , Rfl:  .  Galcanezumab-gnlm (EMGALITY) 120 MG/ML SOSY, Inject 120 mg into the skin every 30 (thirty) days., Disp: 1 Syringe, Rfl: 0 .  Insulin Glargine (LANTUS SOLOSTAR) 100 UNIT/ML Solostar Pen, Inject 40 Units into the skin daily. (Patient taking differently: Inject 49 Units into the skin daily. ), Disp: 15 pen, Rfl: 11 .  Insulin Pen Needle (ULTICARE SHORT PEN NEEDLES) 31G X 8 MM MISC, USE TO INJECT INSULIN DAILY, Disp: 100 each, Rfl: 2 .  Lancets (ONETOUCH DELICA PLUS 123XX123) MISC, USE TO CHECK BLOOD SUGAR TWO TIMES DAILY, Disp: 100 each, Rfl: 12 .  losartan-hydrochlorothiazide (HYZAAR) 50-12.5 MG tablet, TAKE 1 TABLET BY MOUTH DAILY., Disp: 90 tablet, Rfl: 1 .  omeprazole (PRILOSEC) 20 MG capsule, Take 20 mg by mouth every morning., Disp: , Rfl:  .  OnabotulinumtoxinA (BOTOX IJ), Inject 155 Units as directed every 3 (three) months. , Disp: , Rfl:  .  ONETOUCH VERIO test  strip, USE TO CHECK BLOOD SUGAR TWO TIMES A DAY, Disp: 100 strip, Rfl: 12 .  potassium gluconate 595 (99 K) MG TABS tablet, 595 mg daily. , Disp: , Rfl:  .  promethazine (PHENERGAN) 12.5 MG tablet, Take 12.5 mg by mouth every 6 (six) hours as needed for nausea or vomiting., Disp: , Rfl:  .  Ubrogepant (UBRELVY) 100 MG TABS, Take 1 tablet by mouth as needed (May repeat dose after 2 hours if needed.  Maximum 2 tablets in 24 hours)., Disp: 10 tablet, Rfl: 3 .  LORazepam (ATIVAN) 0.5 MG tablet, Take 1 tablet (0.5 mg total) by mouth every  8 (eight) hours as needed for anxiety., Disp: 30 tablet, Rfl: 0   Candee Furbish, MD [facility] 09/11/2019 3:20 PM    Subjective:   PATIENT ID: [name] GENDER: female DOB: 1951-04-09, MRN: [id number]  Chief Complaint  Patient presents with  . Consult    having sob with exertion, follow up for PE, swollen lymph nodes, having non productive cough    HPI -Here for DOE. -Started when she was diagnosed with unprovoked PE in Sept 2020. -Feels too short of breath to try exercising -Breathing can get bad at rest or with minimal activity -On no home oxygen -Walked around the office with her: no desats, HR remained elevated. -Admits she is pretty nervous and has a lot of stressors caring for MIL -Has a number of complex medical issues detailed in A/P that are unrelated to DOE. -Remains on eliquis 5mg  BID but has plans to drop to 2.5mg  BID -Echo, cardiac stress test reassuring -Repeat CT shows PE resolution but raised question of increased hilar adenopathy.  Ancillary information including prior medications, full medical/surgical/family/social histoies, and PFTs (when available) are listed below and have been reviewed.   ROS + symptoms in bold Fevers, chills, weight loss Nausea, vomiting, diarrhea Shortness of breath, wheezing, cough Chest pain, palpitations, lower ext edema   Objective:   Vitals:   09/11/19 1451  BP: (!) 144/72  Pulse: (!) 119  Temp: (!) 97.1 F (36.2 C)  TempSrc: Temporal  SpO2: 95%  Weight: 158 lb 3.2 oz (71.8 kg)  Height: 5' (1.524 m)   95% on  RA BMI Readings from Last 3 Encounters:  09/11/19 30.90 kg/m  09/04/19 30.64 kg/m  09/03/19 29.59 kg/m   Wt Readings from Last 3 Encounters:  09/11/19 158 lb 3.2 oz (71.8 kg)  09/04/19 159 lb 8 oz (72.3 kg)  09/03/19 159 lb 3.2 oz (72.2 kg)    GEN: anxious woman in no acute distress HEENT: trachea midline, mucus membranes moist CV: Tachycardic, regular rhythm,  extremities are warm PULM: Clear, no wheezing or rhonci GI: Soft, +BS EXT: no edema NEURO: Moves all 4 extremities, ambulates well   Ancillary Information    Past Medical History:  Diagnosis Date  . Anemia   . Arthritis    osteoarthritis B knees, left hip , right elbow  . Clotting disorder ([place])   . Complication of anesthesia    difficult waking  . Diabetes mellitus without complication ([place])    Diet and exercise controlled  . GERD (gastroesophageal reflux disease)   . Headache   . History of kidney stones   . Hyperlipidemia   . MVP (mitral valve prolapse)    history of  . PONV (postoperative nausea and vomiting)      Family History  Problem Relation Age of Onset  . Cancer Mother  bone  . Hypertension Father   . Mitral valve prolapse Father   . Asthma Brother   . Arthritis Brother   . Nephrolithiasis Brother   . Nephrolithiasis Brother   . Asthma Brother   . Arthritis Brother   . Aortic aneurysm Brother        ascending aortic aneuysm  . Breast cancer Maternal Aunt   . Breast cancer Maternal Aunt      Past Surgical History:  Procedure Laterality Date  . arm surgery    . BREAST BIOPSY Left   . BREAST SURGERY  2000   breast biopsy (benign)  . LITHOTRIPSY  08-12-2008   stent placed bilaterally  . PARTIAL HYSTERECTOMY     Both ovaries remain, vaginal, for mennorhagia  . TONSILLECTOMY    . TUBAL LIGATION  1980    Social History   Socioeconomic History  . Marital status: Married    Spouse name: Carloyn Manner  . Number of children: 0  . Years of education: Not on file  . Highest education level: Not on file  Occupational History  . Occupation: retired Energy manager: retired  Tobacco Use  . Smoking status: Former Research scientist (life sciences)  . Smokeless tobacco: Never Used  Substance and Sexual Activity  . Alcohol use: No  . Drug use: No  . Sexual activity: Not on file  Other Topics Concern  . Not on file  Social History Narrative   Regular exercise--yes,  recumbent bike 3-4 days a week      Diet: fruits and veggies, water, eats at home, drinks a lot of milk      Patient is right-handed. She drinks 1-2 cups of coffee a day.      One story home   Social Determinants of Health   Financial Resource Strain: Low Risk   . Difficulty of Paying Living Expenses: Not hard at all  Food Insecurity: No Food Insecurity  . Worried About Charity fundraiser in the Last Year: Never true  . Ran Out of Food in the Last Year: Never true  Transportation Needs: No Transportation Needs  . Lack of Transportation (Medical): No  . Lack of Transportation (Non-Medical): No  Physical Activity: Inactive  . Days of Exercise per Week: 0 days  . Minutes of Exercise per Session: 0 min  Stress: No Stress Concern Present  . Feeling of Stress : Not at all  Social Connections:   . Frequency of Communication with Friends and Family:   . Frequency of Social Gatherings with Friends and Family:   . Attends Religious Services:   . Active Member of Clubs or Organizations:   . Attends Archivist Meetings:   Marland Kitchen Marital Status:   Intimate Partner Violence: Not At Risk  . Fear of Current or Ex-Partner: No  . Emotionally Abused: No  . Physically Abused: No  . Sexually Abused: No     Allergies  Allergen Reactions  . Atenolol   . Codeine     REACTION: Migraine  . Penicillins Itching    Has patient had a PCN reaction causing immediate rash, facial/tongue/throat swelling, SOB or lightheadedness with hypotension: No Has patient had a PCN reaction causing severe rash involving mucus membranes or skin necrosis: No Has patient had a PCN reaction that required hospitalization: No Has patient had a PCN reaction occurring within the last 10 years: No If all of the above answers are "NO", then may proceed with Cephalosporin use.   Marland Kitchen Pentazocine Lactate  REACTION: Swelling, itching, rash  . Propranolol Other (See Comments)  . Crestor [Rosuvastatin] Other (See  Comments)  . Sudafed [Pseudoephedrine Hcl] Itching and Anxiety     CBC    Component Value Date/Time   WBC 12.6 (H) 08/31/2019 1330   RBC 4.49 08/31/2019 1330   HGB 12.6 08/31/2019 1330   HCT 38.8 08/31/2019 1330   PLT 363 08/31/2019 1330   MCV 86.4 08/31/2019 1330   MCH 28.1 08/31/2019 1330   MCHC 32.5 08/31/2019 1330   RDW 15.3 08/31/2019 1330   LYMPHSABS 1.1 08/31/2019 1330   MONOABS 0.7 08/31/2019 1330   EOSABS 0.1 08/31/2019 1330   BASOSABS 0.1 08/31/2019 1330    Pulmonary Functions Testing Results: No flowsheet data found.  Outpatient Medications Prior to Visit  Medication Sig Dispense Refill  . apixaban (ELIQUIS) 5 MG TABS tablet Take 1 tablet (5 mg total) by mouth 2 (two) times daily. 180 tablet 1  . atorvastatin (LIPITOR) 80 MG tablet TAKE 1 TABLET BY MOUTH DAILY. 90 tablet 3  . budesonide (ENTOCORT EC) 3 MG 24 hr capsule Take 6 mg by mouth daily.     . cholecalciferol (VITAMIN D) 1000 units tablet Take 1,000 Units by mouth daily.    . colesevelam (WELCHOL) 625 MG tablet TAKE 3 TABLETS BY MOUTH 2 TIMES DAILY WITH A MEAL. 540 tablet 3  . gabapentin (NEURONTIN) 100 MG capsule Take 100 mg by mouth 3 (three) times daily between meals as needed.    . gabapentin (NEURONTIN) 300 MG capsule Take 2 capsules by mouth at bedtime.    . Galcanezumab-gnlm (EMGALITY) 120 MG/ML SOSY Inject 120 mg into the skin every 30 (thirty) days. 1 Syringe 0  . Insulin Glargine (LANTUS SOLOSTAR) 100 UNIT/ML Solostar Pen Inject 40 Units into the skin daily. (Patient taking differently: Inject 49 Units into the skin daily. ) 15 pen 11  . Insulin Pen Needle (ULTICARE SHORT PEN NEEDLES) 31G X 8 MM MISC USE TO INJECT INSULIN DAILY 100 each 2  . Lancets (ONETOUCH DELICA PLUS 123XX123) MISC USE TO CHECK BLOOD SUGAR TWO TIMES DAILY 100 each 12  . losartan-hydrochlorothiazide (HYZAAR) 50-12.5 MG tablet TAKE 1 TABLET BY MOUTH DAILY. 90 tablet 1  . omeprazole (PRILOSEC) 20 MG capsule Take 20 mg by mouth  every morning.    . OnabotulinumtoxinA (BOTOX IJ) Inject 155 Units as directed every 3 (three) months.     Glory Rosebush VERIO test strip USE TO CHECK BLOOD SUGAR TWO TIMES A DAY 100 strip 12  . potassium gluconate 595 (99 K) MG TABS tablet 595 mg daily.     . promethazine (PHENERGAN) 12.5 MG tablet Take 12.5 mg by mouth every 6 (six) hours as needed for nausea or vomiting.    Marland Kitchen Ubrogepant (UBRELVY) 100 MG TABS Take 1 tablet by mouth as needed (May repeat dose after 2 hours if needed.  Maximum 2 tablets in 24 hours). 10 tablet 3  . gabapentin (NEURONTIN) 100 MG capsule TAKE 1 CAPSULE BY MOUTH UP TO 3 TIMES DAILY AS NEEDED. 270 capsule 3   No facility-administered medications prior to visit.

## 2019-09-11 NOTE — Patient Instructions (Signed)
-   CT Chest in July to look at lymph nodes again - Try ativan to see if it takes edge off dyspnea - Your heart and lungs look great on imaging and studies - Check lung function tests to assure nothing else to worry about - 1 month tele visit with me - I am okay with you decreasing your eliquis to 2.5mg  twice a day

## 2019-09-12 ENCOUNTER — Other Ambulatory Visit: Payer: Self-pay | Admitting: Neurology

## 2019-09-14 LAB — HM DIABETES EYE EXAM

## 2019-09-14 MED ORDER — APIXABAN 2.5 MG PO TABS: 2.5000 mg | ORAL_TABLET | Freq: Two times a day (BID) | ORAL | 3 refills | Status: DC

## 2019-09-14 NOTE — Telephone Encounter (Signed)
Okay to send in Rx for Eliquis 2.5 mg bid?

## 2019-09-14 NOTE — Telephone Encounter (Signed)
Refill sent by Dr. Lorelei Pont.  Will forward back to Dr. Diona Browner to review the rest of Debra Leach's MyChart message.

## 2019-09-17 ENCOUNTER — Encounter: Payer: Self-pay | Admitting: Family Medicine

## 2019-09-17 ENCOUNTER — Other Ambulatory Visit: Payer: Self-pay

## 2019-09-17 ENCOUNTER — Telehealth: Payer: Self-pay

## 2019-09-17 DIAGNOSIS — M545 Low back pain, unspecified: Secondary | ICD-10-CM

## 2019-09-17 DIAGNOSIS — G8929 Other chronic pain: Secondary | ICD-10-CM

## 2019-09-17 MED ORDER — GABAPENTIN 300 MG PO CAPS: 600.0000 mg | ORAL_CAPSULE | Freq: Every day | ORAL | 1 refills | Status: DC

## 2019-09-17 NOTE — Telephone Encounter (Signed)
Incoming fax recedvied from pt pharmacy, Requesting Gabapentin 300mg  tabs BID 90 days supply.   After reviewing the pt chart: No script written by Dr. Tomi Likens.  Dr. Tomi Likens please review.

## 2019-09-17 NOTE — Telephone Encounter (Signed)
Reviewed [employer] message.

## 2019-09-17 NOTE — Telephone Encounter (Signed)
I have prescribed the gabapentin for Debra Leach.  We can send refills.

## 2019-09-25 ENCOUNTER — Other Ambulatory Visit: Payer: Self-pay

## 2019-09-25 ENCOUNTER — Other Ambulatory Visit
Admission: RE | Admit: 2019-09-25 | Discharge: 2019-09-25 | Disposition: A | Discharge status: Home / Self Care | Payer: Medicare Other | Source: Ambulatory Visit | Attending: Internal Medicine | Admitting: Internal Medicine

## 2019-09-25 DIAGNOSIS — Z01812 Encounter for preprocedural laboratory examination: Secondary | ICD-10-CM | POA: Insufficient documentation

## 2019-09-25 DIAGNOSIS — Z20822 Contact with and (suspected) exposure to covid-19: Secondary | ICD-10-CM | POA: Diagnosis not present

## 2019-09-25 LAB — SARS CORONAVIRUS 2 (TAT 6-24 HRS): SARS Coronavirus 2: NEGATIVE

## 2019-09-26 ENCOUNTER — Telehealth: Payer: Self-pay

## 2019-09-26 ENCOUNTER — Institutional Professional Consult (permissible substitution): Payer: Medicare Other | Admitting: Pulmonary Disease

## 2019-09-26 DIAGNOSIS — L570 Actinic keratosis: Secondary | ICD-10-CM | POA: Insufficient documentation

## 2019-09-26 NOTE — Assessment & Plan Note (Signed)
Procedure note:  Patient gave consent for procedure . Notified of potential for scarring at site of cryotherapy. Area of concern treated with cryotherapy for 3 freeze thaw cycles with a 1 mm border of freeze. No complications.

## 2019-09-26 NOTE — Assessment & Plan Note (Signed)
Given tachycardia and history of DVT... refer to pulmonary for further evaluation.

## 2019-09-26 NOTE — Telephone Encounter (Signed)
Reviewed medications per PCP consult for potential drug-induced stuttering  Pt reports stuttering for the past several years with recent worsening. Per review, I agree with PCP gabapentin seems most likely. Promethazine, if used regularly, may also contribute, especially if she is taking any OTC medications with anticholinergic properties.   No other medications on her active medication list have been associated with drug-induced stuttering. Reviewed drug interactions; None significant or likely contributory.   Thank you for the consult,  Debbora Dus, PharmD Clinical Pharmacist [facility] [phone]

## 2019-09-26 NOTE — Assessment & Plan Note (Signed)
Neg cardiac eval as source of SOB. Cardiology note reviewed in detail.

## 2019-09-26 NOTE — Assessment & Plan Note (Signed)
Increase to  45 Unit Lantus.. then titrate up 2 units every 2 days until FBS < 120. As tolerated increase waling and physical activity.

## 2019-09-28 ENCOUNTER — Encounter: Payer: Self-pay | Admitting: *Deleted

## 2019-09-28 ENCOUNTER — Ambulatory Visit (INDEPENDENT_AMBULATORY_CARE_PROVIDER_SITE_OTHER): Payer: Medicare Other | Admitting: Internal Medicine

## 2019-09-28 ENCOUNTER — Other Ambulatory Visit: Payer: Self-pay

## 2019-09-28 DIAGNOSIS — R0609 Other forms of dyspnea: Secondary | ICD-10-CM

## 2019-09-28 DIAGNOSIS — R0600 Dyspnea, unspecified: Secondary | ICD-10-CM

## 2019-09-28 LAB — PULMONARY FUNCTION TEST
DL/VA % pred: 119 %
DL/VA: 5.06 ml/min/mmHg/L
DLCO cor % pred: 111 %
DLCO cor: 19.62 ml/min/mmHg
DLCO unc % pred: 108 %
DLCO unc: 19.12 ml/min/mmHg
FEF 25-75 Post: 2.25 L/sec
FEF 25-75 Pre: 1.77 L/sec
FEF2575-%Change-Post: 27 %
FEF2575-%Pred-Post: 123 %
FEF2575-%Pred-Pre: 96 %
FEV1-%Change-Post: 5 %
FEV1-%Pred-Post: 93 %
FEV1-%Pred-Pre: 88 %
FEV1-Post: 1.91 L
FEV1-Pre: 1.82 L
FEV1FVC-%Change-Post: 4 %
FEV1FVC-%Pred-Pre: 106 %
FEV6-%Change-Post: 0 %
FEV6-%Pred-Post: 86 %
FEV6-%Pred-Pre: 86 %
FEV6-Post: 2.24 L
FEV6-Pre: 2.24 L
FEV6FVC-%Pred-Post: 104 %
FEV6FVC-%Pred-Pre: 104 %
FVC-%Change-Post: 0 %
FVC-%Pred-Post: 83 %
FVC-%Pred-Pre: 82 %
FVC-Post: 2.26 L
FVC-Pre: 2.24 L
Post FEV1/FVC ratio: 85 %
Post FEV6/FVC ratio: 100 %
Pre FEV1/FVC ratio: 81 %
Pre FEV6/FVC Ratio: 100 %
RV % pred: 102 %
RV: 2.04 L
TLC % pred: 95 %
TLC: 4.39 L

## 2019-09-28 NOTE — Progress Notes (Signed)
Full PFT performed today. °

## 2019-09-28 NOTE — Telephone Encounter (Signed)
Left message for Debra Leach that I was calling her in regards to our pharmacist review.  I advised that I would send the review to her via MyChart and if she has any questions, she can call me back.

## 2019-09-28 NOTE — Telephone Encounter (Signed)
Call Let Patient know what Debbora Dus Pharmacist stated after a chart review.

## 2019-09-28 NOTE — Telephone Encounter (Signed)
Noted  

## 2019-10-04 DIAGNOSIS — E119 Type 2 diabetes mellitus without complications: Secondary | ICD-10-CM

## 2019-10-04 MED ORDER — LANTUS SOLOSTAR 100 UNIT/ML ~~LOC~~ SOPN: 61.0000 [IU] | PEN_INJECTOR | Freq: Every day | SUBCUTANEOUS | 0 refills | Status: DC

## 2019-10-04 MED ORDER — ULTICARE SHORT PEN NEEDLES 31G X 8 MM MISC: 0 refills | Status: DC

## 2019-10-08 ENCOUNTER — Telehealth: Payer: Self-pay | Admitting: Family Medicine

## 2019-10-08 LAB — HM DIABETES FOOT EXAM

## 2019-10-08 NOTE — Progress Notes (Signed)
°  Chronic Care Management   Note  10/08/2019 Name: Debra Leach MRN: [id number] DOB: 10-06-1950  Debra Leach is a 69 y.o. year old female who is a primary care patient of Bedsole, Amy E, MD. I reached out to [name] by phone today in response to a referral sent by [name] PCP, [name], MD.   Debra Leach was given information about Chronic Care Management services today including:  1. CCM service includes personalized support from designated clinical staff supervised by her physician, including individualized plan of care and coordination with other care providers 2. 24/7 contact phone numbers for assistance for urgent and routine care needs. 3. Service will only be billed when office clinical staff spend 20 minutes or more in a month to coordinate care. 4. Only one practitioner may furnish and bill the service in a calendar month. 5. The patient may stop CCM services at any time (effective at the end of the month) by phone call to the office staff.   Patient agreed to services and verbal consent obtained.    This note is not being shared with the patient for the following reason: To respect privacy (The patient or proxy has requested that the information not be shared).  Follow up plan:   [name] [employer] Scheduler

## 2019-10-08 NOTE — Progress Notes (Signed)
  Chronic Care Management   Outreach Note  10/08/2019 Name: Debra Leach MRN: [id number] DOB: Oct 14, 1950  Referred by: [name], MD Reason for referral : No chief complaint on file.   An unsuccessful telephone outreach was attempted today. The patient was referred to the pharmacist for assistance with care management and care coordination.    This note is not being shared with the patient for the following reason: To respect privacy (The patient or proxy has requested that the information not be shared).  Follow Up Plan:   [name] [employer] Scheduler

## 2019-10-10 ENCOUNTER — Other Ambulatory Visit (INDEPENDENT_AMBULATORY_CARE_PROVIDER_SITE_OTHER): Payer: Medicare Other

## 2019-10-10 DIAGNOSIS — E1165 Type 2 diabetes mellitus with hyperglycemia: Secondary | ICD-10-CM | POA: Diagnosis not present

## 2019-10-10 DIAGNOSIS — Z79899 Other long term (current) drug therapy: Secondary | ICD-10-CM | POA: Diagnosis not present

## 2019-10-10 DIAGNOSIS — E785 Hyperlipidemia, unspecified: Secondary | ICD-10-CM | POA: Diagnosis not present

## 2019-10-10 LAB — CBC WITH DIFFERENTIAL/PLATELET
Basophils Absolute: 0.1 10*3/uL (ref 0.0–0.1)
Basophils Relative: 0.9 % (ref 0.0–3.0)
Eosinophils Absolute: 0.2 10*3/uL (ref 0.0–0.7)
Eosinophils Relative: 1.8 % (ref 0.0–5.0)
HCT: 37.4 % (ref 36.0–46.0)
Hemoglobin: 12.1 g/dL (ref 12.0–15.0)
Lymphocytes Relative: 15.4 % (ref 12.0–46.0)
Lymphs Abs: 1.5 10*3/uL (ref 0.7–4.0)
MCHC: 32.4 g/dL (ref 30.0–36.0)
MCV: 86.4 fl (ref 78.0–100.0)
Monocytes Absolute: 0.8 10*3/uL (ref 0.1–1.0)
Monocytes Relative: 8.1 % (ref 3.0–12.0)
Neutro Abs: 7.3 10*3/uL (ref 1.4–7.7)
Neutrophils Relative %: 73.8 % (ref 43.0–77.0)
Platelets: 353 10*3/uL (ref 150.0–400.0)
RBC: 4.33 Mil/uL (ref 3.87–5.11)
RDW: 16 % — ABNORMAL HIGH (ref 11.5–15.5)
WBC: 10 10*3/uL (ref 4.0–10.5)

## 2019-10-10 LAB — LIPID PANEL
Cholesterol: 231 mg/dL — ABNORMAL HIGH (ref 0–200)
HDL: 56.5 mg/dL (ref >39.00)
LDL Cholesterol: 141 mg/dL — ABNORMAL HIGH (ref 0–99)
NonHDL: 174.41
Total CHOL/HDL Ratio: 4
Triglycerides: 168 mg/dL — ABNORMAL HIGH (ref 0.0–149.0)
VLDL: 33.6 mg/dL (ref 0.0–40.0)

## 2019-10-10 LAB — HEPATIC FUNCTION PANEL
ALT: 18 U/L (ref 0–35)
AST: 15 U/L (ref 0–37)
Albumin: 3.5 g/dL (ref 3.5–5.2)
Alkaline Phosphatase: 77 U/L (ref 39–117)
Bilirubin, Direct: 0 mg/dL (ref 0.0–0.3)
Total Bilirubin: 0.4 mg/dL (ref 0.2–1.2)
Total Protein: 6.4 g/dL (ref 6.0–8.3)

## 2019-10-10 LAB — BASIC METABOLIC PANEL
BUN: 15 mg/dL (ref 6–23)
CO2: 31 mEq/L (ref 19–32)
Calcium: 8.8 mg/dL (ref 8.4–10.5)
Chloride: 105 mEq/L (ref 96–112)
Creatinine, Ser: 0.99 mg/dL (ref 0.40–1.20)
GFR: 55.71 mL/min — ABNORMAL LOW (ref >60.00)
Glucose, Bld: 108 mg/dL — ABNORMAL HIGH (ref 70–99)
Potassium: 3.9 mEq/L (ref 3.5–5.1)
Sodium: 141 mEq/L (ref 135–145)

## 2019-10-10 LAB — HEMOGLOBIN A1C: Hgb A1c MFr Bld: 8.6 % — ABNORMAL HIGH (ref 4.6–6.5)

## 2019-10-12 ENCOUNTER — Other Ambulatory Visit: Payer: Self-pay

## 2019-10-12 ENCOUNTER — Ambulatory Visit (INDEPENDENT_AMBULATORY_CARE_PROVIDER_SITE_OTHER): Payer: Medicare Other | Admitting: Family Medicine

## 2019-10-12 ENCOUNTER — Encounter: Payer: Self-pay | Admitting: Family Medicine

## 2019-10-12 VITALS — BP 144/78 | HR 89 | Temp 97.5°F | Resp 20 | Ht 60.5 in | Wt 157.5 lb

## 2019-10-12 DIAGNOSIS — I152 Hypertension secondary to endocrine disorders: Secondary | ICD-10-CM

## 2019-10-12 DIAGNOSIS — F418 Other specified anxiety disorders: Secondary | ICD-10-CM | POA: Insufficient documentation

## 2019-10-12 DIAGNOSIS — F8081 Childhood onset fluency disorder: Secondary | ICD-10-CM

## 2019-10-12 DIAGNOSIS — E1159 Type 2 diabetes mellitus with other circulatory complications: Secondary | ICD-10-CM | POA: Diagnosis not present

## 2019-10-12 DIAGNOSIS — E1165 Type 2 diabetes mellitus with hyperglycemia: Secondary | ICD-10-CM

## 2019-10-12 DIAGNOSIS — E1169 Type 2 diabetes mellitus with other specified complication: Secondary | ICD-10-CM | POA: Diagnosis not present

## 2019-10-12 DIAGNOSIS — R21 Rash and other nonspecific skin eruption: Secondary | ICD-10-CM

## 2019-10-12 DIAGNOSIS — I10 Essential (primary) hypertension: Secondary | ICD-10-CM

## 2019-10-12 DIAGNOSIS — R0600 Dyspnea, unspecified: Secondary | ICD-10-CM

## 2019-10-12 DIAGNOSIS — E785 Hyperlipidemia, unspecified: Secondary | ICD-10-CM

## 2019-10-12 DIAGNOSIS — R0609 Other forms of dyspnea: Secondary | ICD-10-CM

## 2019-10-12 MED ORDER — ALPRAZOLAM 0.25 MG PO TABS: 0.2500 mg | ORAL_TABLET | Freq: Every day | ORAL | 0 refills | Status: DC | PRN

## 2019-10-12 NOTE — Assessment & Plan Note (Signed)
.  abemed

## 2019-10-12 NOTE — Assessment & Plan Note (Signed)
On max atorvastatin and welchol. Encouraged exercise, weight loss, healthy eating habits.

## 2019-10-12 NOTE — Progress Notes (Signed)
Chief Complaint  Patient presents with  . Diabetes    follow up    History of Present Illness: HPI   69 year old female presents for 3 month follow up Dm. She is on steroids for autoimmune pancreatitis.   She is feeling better overall in last 2 weeks.   She has has been having more anxiety.  She did not tolerate the .. too sedating even at lower dose.  She is having less stuttering, decrease in migraines. She really is not anxious all the time.. just when her mother in law causes issues.  She is back to exercising on stationary bike... gradually increasing. 20-30 min now. She feels her breathing has improved with her improved exercise tolerance.   The plan with budesonide now that her nausea is gone.. decrease in June and stopping in  November.  Diabetes:   Inadequate control.. on Insulin 61 units daily now since April Lab Results  Component Value Date   HGBA1C 8.6 (H) 10/10/2019  Using medications without difficulties: Hypoglycemic episodes: rare Hyperglycemic episodes: one day 300 due to party Feet problems: no ulcer Blood Sugars averaging: FBS 90-120 eye exam within last year: yes  She has noted whitish yellow lesions on lower chin in last 6-9 months.  No itching, no redness no pain.  Pressed on one and thick firm substance came out   This visit occurred during the SARS-CoV-2 public health emergency.  Safety protocols were in place, including screening questions prior to the visit, additional usage of staff PPE, and extensive cleaning of exam room while observing appropriate contact time as indicated for disinfecting solutions.   COVID 19 screen:  No recent travel or known exposure to COVID19 The patient denies respiratory symptoms of COVID 19 at this time. The importance of social distancing was discussed today.     Review of Systems  Constitutional: Negative for chills and fever.  HENT: Negative for congestion and ear pain.   Eyes: Negative for pain and  redness.  Respiratory: Negative for cough and shortness of breath.   Cardiovascular: Negative for chest pain, palpitations and leg swelling.  Gastrointestinal: Negative for abdominal pain, blood in stool, constipation, diarrhea, nausea and vomiting.  Genitourinary: Negative for dysuria.  Musculoskeletal: Negative for falls and myalgias.  Skin: Positive for rash. Negative for itching.  Neurological: Negative for dizziness.  Psychiatric/Behavioral: Negative for depression. The patient is not nervous/anxious.       Past Medical History:  Diagnosis Date  . Anemia   . Arthritis    osteoarthritis B knees, left hip , right elbow  . Clotting disorder ([place])   . Complication of anesthesia    difficult waking  . Diabetes mellitus without complication ([place])    Diet and exercise controlled  . GERD (gastroesophageal reflux disease)   . Headache   . History of kidney stones   . Hyperlipidemia   . MVP (mitral valve prolapse)    history of  . PONV (postoperative nausea and vomiting)     reports that she has quit smoking. She has never used smokeless tobacco. She reports that she does not drink alcohol or use drugs.   Current Outpatient Medications:  .  apixaban (ELIQUIS) 2.5 MG TABS tablet, Take 1 tablet (2.5 mg total) by mouth 2 (two) times daily., Disp: 180 tablet, Rfl: 3 .  atorvastatin (LIPITOR) 80 MG tablet, TAKE 1 TABLET BY MOUTH DAILY., Disp: 90 tablet, Rfl: 3 .  budesonide (ENTOCORT EC) 3 MG 24 hr capsule, Take 6 mg  by mouth daily. , Disp: , Rfl:  .  cholecalciferol (VITAMIN D) 1000 units tablet, Take 1,000 Units by mouth daily., Disp: , Rfl:  .  colesevelam (WELCHOL) 625 MG tablet, TAKE 3 TABLETS BY MOUTH 2 TIMES DAILY WITH A MEAL., Disp: 540 tablet, Rfl: 3 .  gabapentin (NEURONTIN) 300 MG capsule, Take 2 capsules (600 mg total) by mouth at bedtime., Disp: 180 capsule, Rfl: 1 .  Galcanezumab-gnlm (EMGALITY) 120 MG/ML SOSY, Inject 120 mg into the skin every 30 (thirty) days., Disp: 1  Syringe, Rfl: 0 .  insulin glargine (LANTUS SOLOSTAR) 100 UNIT/ML Solostar Pen, Inject 61 Units into the skin daily., Disp: 15 mL, Rfl: 0 .  Insulin Pen Needle (ULTICARE SHORT PEN NEEDLES) 31G X 8 MM MISC, USE TO INJECT INSULIN DAILY, Disp: 30 each, Rfl: 0 .  Lancets (ONETOUCH DELICA PLUS 123XX123) MISC, USE TO CHECK BLOOD SUGAR TWO TIMES DAILY, Disp: 100 each, Rfl: 12 .  LORazepam (ATIVAN) 0.5 MG tablet, Take 1 tablet (0.5 mg total) by mouth every 8 (eight) hours as needed for anxiety., Disp: 30 tablet, Rfl: 0 .  losartan-hydrochlorothiazide (HYZAAR) 50-12.5 MG tablet, TAKE 1 TABLET BY MOUTH DAILY., Disp: 90 tablet, Rfl: 1 .  omeprazole (PRILOSEC) 20 MG capsule, Take 20 mg by mouth every morning., Disp: , Rfl:  .  OnabotulinumtoxinA (BOTOX IJ), Inject 155 Units as directed every 3 (three) months. , Disp: , Rfl:  .  ONETOUCH VERIO test strip, USE TO CHECK BLOOD SUGAR TWO TIMES A DAY, Disp: 100 strip, Rfl: 12 .  potassium gluconate 595 (99 K) MG TABS tablet, 595 mg daily. , Disp: , Rfl:  .  promethazine (PHENERGAN) 12.5 MG tablet, Take 12.5 mg by mouth every 6 (six) hours as needed for nausea or vomiting., Disp: , Rfl:  .  Ubrogepant (UBRELVY) 100 MG TABS, Take 1 tablet by mouth as needed (May repeat dose after 2 hours if needed.  Maximum 2 tablets in 24 hours)., Disp: 10 tablet, Rfl: 3   Observations/Objective: Blood pressure (!) 144/78, pulse 89, temperature (!) 97.5 F (36.4 C), resp. rate 20, height 5' 0.5" (1.537 m), weight 157 lb 8 oz (71.4 kg).  Physical Exam Constitutional:      General: She is not in acute distress.    Appearance: Normal appearance. She is well-developed. She is not ill-appearing or toxic-appearing.  HENT:     Head: Normocephalic.     Right Ear: Hearing, tympanic membrane, ear canal and external ear normal. Tympanic membrane is not erythematous, retracted or bulging.     Left Ear: Hearing, tympanic membrane, ear canal and external ear normal. Tympanic membrane is not  erythematous, retracted or bulging.     Nose: No mucosal edema or rhinorrhea.     Right Sinus: No maxillary sinus tenderness or frontal sinus tenderness.     Left Sinus: No maxillary sinus tenderness or frontal sinus tenderness.     Mouth/Throat:     Pharynx: Uvula midline.  Eyes:     General: Lids are normal. Lids are everted, no foreign bodies appreciated.     Conjunctiva/sclera: Conjunctivae normal.     Pupils: Pupils are equal, round, and reactive to light.  Neck:     Thyroid: No thyroid mass or thyromegaly.     Vascular: No carotid bruit.     Trachea: Trachea normal.  Cardiovascular:     Rate and Rhythm: Normal rate and regular rhythm.     Pulses: Normal pulses.     Heart sounds: Normal  heart sounds, S1 normal and S2 normal. No murmur. No friction rub. No gallop.   Pulmonary:     Effort: Pulmonary effort is normal. No tachypnea or respiratory distress.     Breath sounds: Normal breath sounds. No decreased breath sounds, wheezing, rhonchi or rales.  Abdominal:     General: Bowel sounds are normal.     Palpations: Abdomen is soft.     Tenderness: There is no abdominal tenderness.  Musculoskeletal:     Cervical back: Normal range of motion and neck supple.  Skin:    General: Skin is warm and dry.     Findings: No rash.     Comments: Yellowish white lesions , multiple on lower jaw bilaterally, no redness associated. No blisters, not clearly pustules  Neurological:     Mental Status: She is alert.  Psychiatric:        Mood and Affect: Mood is not anxious or depressed.        Speech: Speech normal.        Behavior: Behavior normal. Behavior is cooperative.        Thought Content: Thought content normal.        Judgment: Judgment normal.      Diabetic foot exam: Normal inspection No skin breakdown No calluses  Normal DP pulses Normal sensation to light touch and monofilament Nails normal  Assessment and Plan   Type 2 diabetes mellitus with hyperglycemia  (HCC) Improving in last few weeks at 61 units insulin... continue.  Plan weaning down prednisone which should improve control.  Stuttering Possibly relate to anxiety. Pharmacist felt not clearly med SE unless secondary to gabapentin.  Issue has now improved.  Hypertension associated with diabetes ([place]) .abemed   Hyperlipidemia associated with type 2 diabetes mellitus ([place]) On max atorvastatin and welchol. Encouraged exercise, weight loss, healthy eating habits.   Dyspnea on minimal exertion Now improving with improved exercise habits.  Situational anxiety Will try trial of xanax prn situational anxiety. Pulm felt SOB may be connected to anxiety.  Facial rash Xanthoma vs sebaceous hyperplasia. If this does not regress with chol improvement and stopping prednsione.. will refer to derm.     Eliezer Lofts, MD

## 2019-10-12 NOTE — Assessment & Plan Note (Signed)
Will try trial of xanax prn situational anxiety. Pulm felt SOB may be connected to anxiety.

## 2019-10-12 NOTE — Assessment & Plan Note (Signed)
Improving in last few weeks at 61 units insulin... continue.  Plan weaning down prednisone which should improve control.

## 2019-10-12 NOTE — Patient Instructions (Addendum)
Can try xanax prn anxiety instead of anxiety.  Call if interested in derm referral for facial rash.

## 2019-10-12 NOTE — Assessment & Plan Note (Signed)
Now improving with improved exercise habits.

## 2019-10-12 NOTE — Assessment & Plan Note (Signed)
Xanthoma vs sebaceous hyperplasia. If this does not regress with chol improvement and stopping prednsione.. will refer to derm.

## 2019-10-12 NOTE — Assessment & Plan Note (Signed)
Possibly relate to anxiety. Pharmacist felt not clearly med SE unless secondary to gabapentin.  Issue has now improved.

## 2019-10-24 ENCOUNTER — Other Ambulatory Visit: Payer: Self-pay

## 2019-10-24 ENCOUNTER — Ambulatory Visit (INDEPENDENT_AMBULATORY_CARE_PROVIDER_SITE_OTHER): Payer: Medicare Other | Admitting: Internal Medicine

## 2019-10-24 DIAGNOSIS — R0600 Dyspnea, unspecified: Secondary | ICD-10-CM | POA: Diagnosis not present

## 2019-10-24 DIAGNOSIS — R590 Localized enlarged lymph nodes: Secondary | ICD-10-CM | POA: Diagnosis not present

## 2019-10-24 DIAGNOSIS — R0609 Other forms of dyspnea: Secondary | ICD-10-CM

## 2019-10-24 NOTE — Progress Notes (Addendum)
Synopsis: DOE Due to the ongoing COVID pandemic this visit was done virtually with patient consent. Addendum: DONE ON PHONE patient=home provider=office.  Assessment & Plan:   Problem 1 DOE: PE has resolved.  Lung parenchyma looks good.  PFTs reassuring.  Heart looks good on echo.  Walking around office (09/11/19) only notable for sustained regular pulse in 130-140 range.  Her resting pulse was in 120s.  With distraction, HR and RR dropped.  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  She admits to a number of stressors related to caring for her 69 year old FIL. 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  She is on botox, emgality, PRN ubrelvy managed by neurology Problem 6 autoimmune pancreatitis- on chronic steroids, also has DM from this; plan for taper off in November 2021  - Patient will keep log of exercise time and calories on bike so we can see how tolerance is going over time - Ativan seems to have been too strong for her, she will try the low dose xanax prescribed by her PCP to see if helps at all - Repeat CT chest in July then f/u over phone with me to review CT results and breathing  MDM A total of 32 minutes was spent on this encounter   Current Outpatient Medications:  .  ALPRAZolam (XANAX) 0.25 MG tablet, Take 1 tablet (0.25 mg total) by mouth daily as needed for anxiety., Disp: 20 tablet, Rfl: 0 .  apixaban (ELIQUIS) 2.5 MG TABS tablet, Take 1 tablet (2.5 mg total) by mouth 2 (two) times daily., Disp: 180 tablet, Rfl: 3 .  atorvastatin (LIPITOR) 80 MG tablet, TAKE 1 TABLET BY MOUTH DAILY., Disp: 90 tablet, Rfl: 3 .   budesonide (ENTOCORT EC) 3 MG 24 hr capsule, Take 6 mg by mouth daily. , Disp: , Rfl:  .  cholecalciferol (VITAMIN D) 1000 units tablet, Take 1,000 Units by mouth daily., Disp: , Rfl:  .  colesevelam (WELCHOL) 625 MG tablet, TAKE 3 TABLETS BY MOUTH 2 TIMES DAILY WITH A MEAL., Disp: 540 tablet, Rfl: 3 .  gabapentin (NEURONTIN) 300 MG capsule, Take 2 capsules (600 mg total) by mouth at bedtime., Disp: 180 capsule, Rfl: 1 .  Galcanezumab-gnlm (EMGALITY) 120 MG/ML SOSY, Inject 120 mg into the skin every 30 (thirty) days., Disp: 1 Syringe, Rfl: 0 .  insulin glargine (LANTUS SOLOSTAR) 100 UNIT/ML Solostar Pen, Inject 61 Units into the skin daily., Disp: 15 mL, Rfl: 0 .  Insulin Pen Needle (ULTICARE SHORT PEN NEEDLES) 31G X 8 MM MISC, USE TO INJECT INSULIN DAILY, Disp: 30 each, Rfl: 0 .  Lancets (ONETOUCH DELICA PLUS 123XX123) MISC, USE TO CHECK BLOOD SUGAR TWO TIMES DAILY, Disp: 100 each, Rfl: 12 .  losartan-hydrochlorothiazide (HYZAAR) 50-12.5 MG tablet, TAKE 1 TABLET BY MOUTH DAILY., Disp: 90 tablet, Rfl: 1 .  omeprazole (PRILOSEC) 20 MG capsule, Take 20 mg by mouth every morning., Disp: , Rfl:  .  OnabotulinumtoxinA (BOTOX IJ), Inject 155 Units as directed every 3 (three) months. , Disp: , Rfl:  .  ONETOUCH VERIO test strip, USE TO CHECK BLOOD SUGAR TWO TIMES A DAY, Disp: 100 strip, Rfl: 12 .  potassium gluconate 595 (99 K) MG TABS tablet, 595 mg daily. , Disp: , Rfl:  .  promethazine (PHENERGAN) 12.5 MG tablet, Take 12.5 mg by mouth every 6 (six) hours as needed for nausea or vomiting., Disp: , Rfl:  .  Ubrogepant (UBRELVY) 100 MG TABS, Take 1 tablet by mouth as needed (May repeat dose after 2 hours if needed.  Maximum 2 tablets in 24 hours)., Disp: 10 tablet, Rfl: [place], MD [facility] 10/24/2019 9:25 AM    Subjective:   PATIENT ID: Debra Leach GENDER: female DOB: 1951/01/22, MRN: [id number]  F/u of DOE  HPI  Hx as below: Suspicion for post-PE  PTSD and decondtioning Last visit, provided reassurance, trial of ativan. Ordered PFTs: benign.  Ativan knocked her out too much, prescribed low dose trial of xanax by PCP. She is able to exercise more on her bike at home but she still thinks she is not back to where she was before.  "-Here for DOE. -Started when she was diagnosed with unprovoked PE in Sept 2020. -Feels too short of breath to try exercising -Breathing can get bad at rest or with minimal activity -On no home oxygen -Walked around the office with her: no desats, HR remained elevated. -Admits she is pretty nervous and has a lot of stressors caring for MIL -Has a number of complex medical issues detailed in A/P that are unrelated to DOE. -Remains on eliquis 5mg  BID but has plans to drop to 2.5mg  BID -Echo, cardiac stress test reassuring -Repeat CT shows PE resolution but raised question of increased hilar adenopathy."  Ancillary information including prior medications, full medical/surgical/family/social histoies, and PFTs (when available) are listed below and have been reviewed."   Objective:   There were no vitals filed for this visit.   on  RA BMI Readings from Last 3 Encounters:  10/12/19 30.25 kg/m  09/11/19 30.90 kg/m  09/04/19 30.64 kg/m   Wt Readings from Last 3 Encounters:  10/12/19 157 lb 8 oz (71.4 kg)  09/11/19 158 lb 3.2 oz (71.8 kg)  09/04/19 159 lb 8 oz (72.3 kg)    Speaking on full sentences on phone.  Fair insight.  Ancillary Information    Past Medical History:  Diagnosis Date  . Anemia   . Arthritis    osteoarthritis B knees, left hip , right elbow  . Clotting disorder ([place])   . Complication of anesthesia    difficult waking  . Diabetes mellitus without complication ([place])    Diet and exercise controlled  . GERD (gastroesophageal reflux disease)   . Headache   . History of kidney stones   . Hyperlipidemia   . MVP (mitral valve prolapse)    history of  . PONV (postoperative  nausea and vomiting)      Family History  Problem Relation Age of Onset  . Cancer Mother        bone  . Hypertension Father   . Mitral valve prolapse Father   . Asthma Brother   . Arthritis Brother   . Nephrolithiasis Brother   . Nephrolithiasis Brother   . Asthma Brother   . Arthritis Brother   . Aortic aneurysm Brother        ascending aortic aneuysm  . Breast cancer Maternal Aunt   . Breast cancer Maternal Aunt      Past Surgical History:  Procedure Laterality Date  . arm surgery    . BREAST BIOPSY Left   .  BREAST SURGERY  2000   breast biopsy (benign)  . LITHOTRIPSY  08-12-2008   stent placed bilaterally  . PARTIAL HYSTERECTOMY     Both ovaries remain, vaginal, for mennorhagia  . TONSILLECTOMY    . TUBAL LIGATION  1980    Social History   Socioeconomic History  . Marital status: Married    Spouse name: Carloyn Manner  . Number of children: 0  . Years of education: Not on file  . Highest education level: Not on file  Occupational History  . Occupation: retired Energy manager: retired  Tobacco Use  . Smoking status: Former Research scientist (life sciences)  . Smokeless tobacco: Never Used  Substance and Sexual Activity  . Alcohol use: No  . Drug use: No  . Sexual activity: Not on file  Other Topics Concern  . Not on file  Social History Narrative   Regular exercise--yes, recumbent bike 3-4 days a week      Diet: fruits and veggies, water, eats at home, drinks a lot of milk      Patient is right-handed. She drinks 1-2 cups of coffee a day.      One story home   Social Determinants of Health   Financial Resource Strain: Low Risk   . Difficulty of Paying Living Expenses: Not hard at all  Food Insecurity: No Food Insecurity  . Worried About Charity fundraiser in the Last Year: Never true  . Ran Out of Food in the Last Year: Never true  Transportation Needs: No Transportation Needs  . Lack of Transportation (Medical): No  . Lack of Transportation (Non-Medical): No    Physical Activity: Inactive  . Days of Exercise per Week: 0 days  . Minutes of Exercise per Session: 0 min  Stress: No Stress Concern Present  . Feeling of Stress : Not at all  Social Connections:   . Frequency of Communication with Friends and Family:   . Frequency of Social Gatherings with Friends and Family:   . Attends Religious Services:   . Active Member of Clubs or Organizations:   . Attends Archivist Meetings:   Marland Kitchen Marital Status:   Intimate Partner Violence: Not At Risk  . Fear of Current or Ex-Partner: No  . Emotionally Abused: No  . Physically Abused: No  . Sexually Abused: No     Allergies  Allergen Reactions  . Atenolol   . Codeine     REACTION: Migraine  . Penicillins Itching    Has patient had a PCN reaction causing immediate rash, facial/tongue/throat swelling, SOB or lightheadedness with hypotension: No Has patient had a PCN reaction causing severe rash involving mucus membranes or skin necrosis: No Has patient had a PCN reaction that required hospitalization: No Has patient had a PCN reaction occurring within the last 10 years: No If all of the above answers are "NO", then may proceed with Cephalosporin use.   Marland Kitchen Pentazocine Lactate     REACTION: Swelling, itching, rash  . Propranolol Other (See Comments)  . Crestor [Rosuvastatin] Other (See Comments)  . Sudafed [Pseudoephedrine Hcl] Itching and Anxiety     CBC    Component Value Date/Time   WBC 10.0 10/10/2019 1010   RBC 4.33 10/10/2019 1010   HGB 12.1 10/10/2019 1010   HCT 37.4 10/10/2019 1010   PLT 353.0 10/10/2019 1010   MCV 86.4 10/10/2019 1010   MCH 28.1 08/31/2019 1330   MCHC 32.4 10/10/2019 1010   RDW 16.0 (H) 10/10/2019 1010  LYMPHSABS 1.5 10/10/2019 1010   MONOABS 0.8 10/10/2019 1010   EOSABS 0.2 10/10/2019 1010   BASOSABS 0.1 10/10/2019 1010    Pulmonary Functions Testing Results: PFT Results Latest Ref Rng & Units 09/28/2019  FVC-Pre L 2.24  FVC-Predicted Pre % 82   FVC-Post L 2.26  FVC-Predicted Post % 83  Pre FEV1/FVC % % 81  Post FEV1/FCV % % 85  FEV1-Pre L 1.82  FEV1-Predicted Pre % 88  FEV1-Post L 1.91  DLCO UNC% % 108  DLCO COR %Predicted % 119  TLC L 4.39  TLC % Predicted % 95  RV % Predicted % 102    Outpatient Medications Prior to Visit  Medication Sig Dispense Refill  . ALPRAZolam (XANAX) 0.25 MG tablet Take 1 tablet (0.25 mg total) by mouth daily as needed for anxiety. 20 tablet 0  . apixaban (ELIQUIS) 2.5 MG TABS tablet Take 1 tablet (2.5 mg total) by mouth 2 (two) times daily. 180 tablet 3  . atorvastatin (LIPITOR) 80 MG tablet TAKE 1 TABLET BY MOUTH DAILY. 90 tablet 3  . budesonide (ENTOCORT EC) 3 MG 24 hr capsule Take 6 mg by mouth daily.     . cholecalciferol (VITAMIN D) 1000 units tablet Take 1,000 Units by mouth daily.    . colesevelam (WELCHOL) 625 MG tablet TAKE 3 TABLETS BY MOUTH 2 TIMES DAILY WITH A MEAL. 540 tablet 3  . gabapentin (NEURONTIN) 300 MG capsule Take 2 capsules (600 mg total) by mouth at bedtime. 180 capsule 1  . Galcanezumab-gnlm (EMGALITY) 120 MG/ML SOSY Inject 120 mg into the skin every 30 (thirty) days. 1 Syringe 0  . insulin glargine (LANTUS SOLOSTAR) 100 UNIT/ML Solostar Pen Inject 61 Units into the skin daily. 15 mL 0  . Insulin Pen Needle (ULTICARE SHORT PEN NEEDLES) 31G X 8 MM MISC USE TO INJECT INSULIN DAILY 30 each 0  . Lancets (ONETOUCH DELICA PLUS 123XX123) MISC USE TO CHECK BLOOD SUGAR TWO TIMES DAILY 100 each 12  . losartan-hydrochlorothiazide (HYZAAR) 50-12.5 MG tablet TAKE 1 TABLET BY MOUTH DAILY. 90 tablet 1  . omeprazole (PRILOSEC) 20 MG capsule Take 20 mg by mouth every morning.    . OnabotulinumtoxinA (BOTOX IJ) Inject 155 Units as directed every 3 (three) months.     Glory Rosebush VERIO test strip USE TO CHECK BLOOD SUGAR TWO TIMES A DAY 100 strip 12  . potassium gluconate 595 (99 K) MG TABS tablet 595 mg daily.     . promethazine (PHENERGAN) 12.5 MG tablet Take 12.5 mg by mouth every 6  (six) hours as needed for nausea or vomiting.    Marland Kitchen Ubrogepant (UBRELVY) 100 MG TABS Take 1 tablet by mouth as needed (May repeat dose after 2 hours if needed.  Maximum 2 tablets in 24 hours). 10 tablet 3   No facility-administered medications prior to visit.

## 2019-10-24 NOTE — Patient Instructions (Signed)
-   Keep track of calories and amount of time on exercise bike over time - Repeat CT in July then f/u over phone with me

## 2019-10-25 ENCOUNTER — Other Ambulatory Visit: Payer: Self-pay | Admitting: Family Medicine

## 2019-10-29 ENCOUNTER — Telehealth: Payer: Self-pay | Admitting: *Deleted

## 2019-10-29 NOTE — Telephone Encounter (Signed)
Okay to see her in office. SOB is chronic.

## 2019-10-29 NOTE — Telephone Encounter (Signed)
Patient left a voicemail stating that she had called last week and scheduled an appointment with Dr. Diona Browner for tomorrow 10/30/19. Patient stated that she has been having SOB and was advised that she needs to talk with a nurse.  Called and spoke to patient and was advised that she had blood clots in her lungs back in September. Patient stated that she has seen pulmonology, hemotology and cardiology. Patient stated that she has had all types of test done and she no longer has blood clots. Patient stated that she has had SOB since the blood clots and there is nothing new going on now. . Patient stated that the SOB is gradually getting better. Patient stated that she has been told by the pulmonologist that the SOB she is having now is probably stress related. Patient stated that she is under a lot of stress. Patient stated the main reason she is coming in to see Dr. Diona Browner is because of all of the muscle weakness that she is having now. Patient was given 911 and ER precautions and she verbalized understanding. Patient stated that she wished she had not used the word SOB because that is not why she really needs the appointment with Dr. Diona Browner. Patient stated that she was told by the pulmonologist that her symptoms should be resolved completely by now and they are not.

## 2019-10-30 ENCOUNTER — Other Ambulatory Visit: Payer: Self-pay

## 2019-10-30 ENCOUNTER — Other Ambulatory Visit: Payer: Self-pay | Admitting: Family Medicine

## 2019-10-30 ENCOUNTER — Encounter: Payer: Self-pay | Admitting: Family Medicine

## 2019-10-30 ENCOUNTER — Ambulatory Visit (INDEPENDENT_AMBULATORY_CARE_PROVIDER_SITE_OTHER): Payer: Medicare Other | Admitting: Family Medicine

## 2019-10-30 VITALS — BP 140/70 | HR 81 | Temp 98.3°F | Ht 60.5 in | Wt 158.0 lb

## 2019-10-30 DIAGNOSIS — E1165 Type 2 diabetes mellitus with hyperglycemia: Secondary | ICD-10-CM

## 2019-10-30 DIAGNOSIS — R0600 Dyspnea, unspecified: Secondary | ICD-10-CM | POA: Diagnosis not present

## 2019-10-30 DIAGNOSIS — R29898 Other symptoms and signs involving the musculoskeletal system: Secondary | ICD-10-CM | POA: Diagnosis not present

## 2019-10-30 DIAGNOSIS — R0609 Other forms of dyspnea: Secondary | ICD-10-CM

## 2019-10-30 NOTE — Progress Notes (Signed)
Chief Complaint  Patient presents with  . Shortness of Breath    ongoing  . Extremity Weakness    in legs  . Diabetes    discuss titrating down on insulin    History of Present Illness: HPI   69 year old female presents for follow up multiple issues.   DOE chronic: Reviewed last OV from Pulmonary 10/24/2019  PFTs reassuring  CT lung reassuring, PEs resolved s/p 6 month anticoagulation, genetic work up negative on 2.5 mg BID eliquis for VTE prevention  ECHO unremarkable Felt likely anxiety/PTSD regarding her PEs  Causing SOB and elevated HR.  Also cardiac work up unreveling.  No anemia.   The patient feels her stress and some milder anxiety is not the cause to her SOB. She feels she has not been stressed enough to need the alprazolam.  She feels her SOB is only related to exertion.  She has noted significant weakness in legs with going up stairs.  She has been walking 1 hour at time 5-7 days a week in the last 8 weeks.   Leg weakness.She has to stop and rest frequently during the walk but then is able to restart. She  discussed with her neighbor possibility of myasthenia gravis.  She has issues with swallowing.. trouble getting food down.  She has noted occ grip strength issue.  No double vision.  She has another autoimmune issue.. the autoimmune pancreatitis.  DM:  On steroids for chronic pancreatitis.  On 61 Units Long acting insulin daily.   recently occ 66,63 , 77... yesterday decreased to 59 units. Lab Results  Component Value Date   HGBA1C 8.6 (H) 10/10/2019     This visit occurred during the SARS-CoV-2 public health emergency.  Safety protocols were in place, including screening questions prior to the visit, additional usage of staff PPE, and extensive cleaning of exam room while observing appropriate contact time as indicated for disinfecting solutions.   COVID 19 screen:  No recent travel or known exposure to COVID19 The patient denies respiratory symptoms of  COVID 19 at this time. The importance of social distancing was discussed today.     Review of Systems  Constitutional: Negative for chills and fever.  HENT: Negative for congestion and ear pain.   Eyes: Negative for pain and redness.  Respiratory: Negative for cough and shortness of breath.   Cardiovascular: Negative for chest pain, palpitations and leg swelling.  Gastrointestinal: Negative for abdominal pain, blood in stool, constipation, diarrhea, nausea and vomiting.  Genitourinary: Negative for dysuria.  Musculoskeletal: Negative for back pain, falls, myalgias and neck pain.  Skin: Negative for rash.  Neurological: Positive for weakness and headaches. Negative for dizziness, sensory change, speech change and focal weakness.  Psychiatric/Behavioral: Negative for depression. The patient is not nervous/anxious.       Past Medical History:  Diagnosis Date  . Anemia   . Arthritis    osteoarthritis B knees, left hip , right elbow  . Clotting disorder ([place])   . Complication of anesthesia    difficult waking  . Diabetes mellitus without complication ([place])    Diet and exercise controlled  . GERD (gastroesophageal reflux disease)   . Headache   . History of kidney stones   . Hyperlipidemia   . MVP (mitral valve prolapse)    history of  . PONV (postoperative nausea and vomiting)     reports that she has quit smoking. She has never used smokeless tobacco. She reports that she does not drink  alcohol or use drugs.   Current Outpatient Medications:  .  ALPRAZolam (XANAX) 0.25 MG tablet, Take 1 tablet (0.25 mg total) by mouth daily as needed for anxiety., Disp: 20 tablet, Rfl: 0 .  apixaban (ELIQUIS) 2.5 MG TABS tablet, Take 1 tablet (2.5 mg total) by mouth 2 (two) times daily., Disp: 180 tablet, Rfl: 3 .  atorvastatin (LIPITOR) 80 MG tablet, TAKE 1 TABLET BY MOUTH DAILY., Disp: 90 tablet, Rfl: 3 .  Blood Glucose Monitoring Suppl (ONETOUCH VERIO REFLECT) w/Device KIT, 2 (two) times  daily. for testing, Disp: , Rfl:  .  budesonide (ENTOCORT EC) 3 MG 24 hr capsule, Take 6 mg by mouth daily. , Disp: , Rfl:  .  cholecalciferol (VITAMIN D) 1000 units tablet, Take 1,000 Units by mouth daily., Disp: , Rfl:  .  colesevelam (WELCHOL) 625 MG tablet, TAKE 3 TABLETS BY MOUTH 2 TIMES DAILY WITH A MEAL., Disp: 540 tablet, Rfl: 3 .  gabapentin (NEURONTIN) 300 MG capsule, Take 2 capsules (600 mg total) by mouth at bedtime., Disp: 180 capsule, Rfl: 1 .  insulin glargine (LANTUS SOLOSTAR) 100 UNIT/ML Solostar Pen, Inject 61 Units into the skin daily., Disp: 15 mL, Rfl: 0 .  Insulin Pen Needle (ULTICARE SHORT PEN NEEDLES) 31G X 8 MM MISC, USE TO INJECT INSULIN DAILY, Disp: 30 each, Rfl: 0 .  Lancets (ONETOUCH DELICA PLUS [id number]) MISC, USE TO CHECK BLOOD SUGAR TWO TIMES DAILY, Disp: 100 each, Rfl: 12 .  losartan-hydrochlorothiazide (HYZAAR) 50-12.5 MG tablet, TAKE 1 TABLET BY MOUTH DAILY., Disp: 90 tablet, Rfl: 1 .  omeprazole (PRILOSEC) 20 MG capsule, Take 20 mg by mouth every morning., Disp: , Rfl:  .  ONETOUCH VERIO test strip, USE TO CHECK BLOOD SUGAR TWO TIMES A DAY, Disp: 100 strip, Rfl: 12 .  potassium gluconate 595 (99 K) MG TABS tablet, 595 mg daily. , Disp: , Rfl:  .  promethazine (PHENERGAN) 12.5 MG tablet, Take 12.5 mg by mouth every 6 (six) hours as needed for nausea or vomiting., Disp: , Rfl:  .  Ubrogepant (UBRELVY) 100 MG TABS, Take 1 tablet by mouth as needed (May repeat dose after 2 hours if needed.  Maximum 2 tablets in 24 hours)., Disp: 10 tablet, Rfl: 3   Observations/Objective: Blood pressure 140/70, pulse 81, temperature 98.3 F (36.8 C), temperature source Temporal, height 5' 0.5" (1.537 m), weight 158 lb (71.7 kg), SpO2 99 %.  Physical Exam Constitutional:      General: She is not in acute distress.    Appearance: Normal appearance. She is well-developed. She is not ill-appearing or toxic-appearing.  HENT:     Head: Normocephalic.     Right Ear: Hearing,  tympanic membrane, ear canal and external ear normal. Tympanic membrane is not erythematous, retracted or bulging.     Left Ear: Hearing, tympanic membrane, ear canal and external ear normal. Tympanic membrane is not erythematous, retracted or bulging.     Nose: No mucosal edema or rhinorrhea.     Right Sinus: No maxillary sinus tenderness or frontal sinus tenderness.     Left Sinus: No maxillary sinus tenderness or frontal sinus tenderness.     Mouth/Throat:     Pharynx: Uvula midline.  Eyes:     General: Lids are normal. Lids are everted, no foreign bodies appreciated.     Conjunctiva/sclera: Conjunctivae normal.     Pupils: Pupils are equal, round, and reactive to light.  Neck:     Thyroid: No thyroid mass or thyromegaly.  Vascular: No carotid bruit.     Trachea: Trachea normal.  Cardiovascular:     Rate and Rhythm: Normal rate and regular rhythm.     Pulses: Normal pulses.     Heart sounds: Normal heart sounds, S1 normal and S2 normal. No murmur. No friction rub. No gallop.   Pulmonary:     Effort: Pulmonary effort is normal. No tachypnea or respiratory distress.     Breath sounds: Normal breath sounds. No decreased breath sounds, wheezing, rhonchi or rales.  Abdominal:     General: Bowel sounds are normal.     Palpations: Abdomen is soft.     Tenderness: There is no abdominal tenderness.  Musculoskeletal:     Cervical back: Normal range of motion and neck supple.  Skin:    General: Skin is warm and dry.     Findings: No rash.  Neurological:     Mental Status: She is alert and oriented to person, place, and time.     GCS: GCS eye subscore is 4. GCS verbal subscore is 5. GCS motor subscore is 6.     Cranial Nerves: No cranial nerve deficit.     Sensory: No sensory deficit.     Motor: No abnormal muscle tone.     Coordination: Coordination normal.     Gait: Gait normal.     Deep Tendon Reflexes: Reflexes are normal and symmetric.     Comments: Nml cerebellar exam   difficulty getting up from squat ( fairly age appropriate) No papilledema  Psychiatric:        Mood and Affect: Mood is not anxious or depressed.        Speech: Speech normal.        Behavior: Behavior normal. Behavior is cooperative.        Thought Content: Thought content normal.        Cognition and Memory: Memory is not impaired. She does not exhibit impaired recent memory or impaired remote memory.        Judgment: Judgment normal.      Assessment and Plan Type 2 diabetes mellitus with hyperglycemia ([place]) Plan to wean down insulin as CBGs lowering on lower dose budesonide.  Dyspnea on minimal exertion Patient forcefully denies anxiety as source of SOB.  neg cardiac and pulmonary eval. GAD 7 : Generalized Anxiety Score 10/12/2019  Nervous, Anxious, on Edge 1  Control/stop worrying 3  Worry too much - different things 0  Trouble relaxing 1  Restless 0  Easily annoyed or irritable 0  Afraid - awful might happen 0  Total GAD 7 Score 5  Anxiety Difficulty Somewhat difficult   \   Leg weakness, bilateral She denies back pain. Nml neuro exam  She reports good muscle strength but early fatigue despite working agreessively on physical activity in last 8 weeks.  She is concerned about  possibility of myasthenia gravis given her other autoimmune issue as well as her swollowing issue, dyspnea and leg weakness.  She denies ocular issue such as diplopia but reports eye fatigue. She has minimal upper body issue expect has dropped items occ with grip fatigue.  I will discuss this possible issue with her neurologist Dr. Loretta Plume.        Eliezer Lofts, MD

## 2019-10-30 NOTE — Assessment & Plan Note (Signed)
Patient forcefully denies anxiety as source of SOB.  neg cardiac and pulmonary eval. GAD 7 : Generalized Anxiety Score 10/12/2019  Nervous, Anxious, on Edge 1  Control/stop worrying 3  Worry too much - different things 0  Trouble relaxing 1  Restless 0  Easily annoyed or irritable 0  Afraid - awful might happen 0  Total GAD 7 Score 5  Anxiety Difficulty Somewhat difficult   \

## 2019-10-30 NOTE — Patient Instructions (Signed)
Reduce insulin as you come down on budesonide.  I will send you a message about the myasthenia gravis concerns.

## 2019-10-30 NOTE — Assessment & Plan Note (Signed)
Plan to wean down insulin as CBGs lowering on lower dose budesonide.

## 2019-10-30 NOTE — Assessment & Plan Note (Signed)
She denies back pain. Nml neuro exam  She reports good muscle strength but early fatigue despite working agreessively on physical activity in last 8 weeks.  She is concerned about  possibility of myasthenia gravis given her other autoimmune issue as well as her swollowing issue, dyspnea and leg weakness.  She denies ocular issue such as diplopia but reports eye fatigue. She has minimal upper body issue expect has dropped items occ with grip fatigue.  I will discuss this possible issue with her neurologist Dr. Loretta Plume.

## 2019-10-31 ENCOUNTER — Other Ambulatory Visit (INDEPENDENT_AMBULATORY_CARE_PROVIDER_SITE_OTHER): Payer: Medicare Other

## 2019-10-31 DIAGNOSIS — R29898 Other symptoms and signs involving the musculoskeletal system: Secondary | ICD-10-CM | POA: Diagnosis not present

## 2019-11-02 LAB — SPECIMEN STATUS REPORT

## 2019-11-07 ENCOUNTER — Ambulatory Visit: Payer: Medicare Other | Admitting: Psychology

## 2019-11-07 ENCOUNTER — Telehealth: Payer: Self-pay | Admitting: Internal Medicine

## 2019-11-07 NOTE — Telephone Encounter (Signed)
Pt aware that Debra Leach has order to schedule pt lives close to [place] she wants to use the [facility] in the morning

## 2019-11-16 ENCOUNTER — Ambulatory Visit: Payer: Medicare Other | Admitting: Psychology

## 2019-11-16 ENCOUNTER — Ambulatory Visit (INDEPENDENT_AMBULATORY_CARE_PROVIDER_SITE_OTHER): Payer: Medicare Other | Admitting: Psychology

## 2019-11-16 DIAGNOSIS — F4323 Adjustment disorder with mixed anxiety and depressed mood: Secondary | ICD-10-CM

## 2019-11-16 DIAGNOSIS — F4310 Post-traumatic stress disorder, unspecified: Secondary | ICD-10-CM | POA: Diagnosis not present

## 2019-11-17 NOTE — Chronic Care Management (AMB) (Signed)
Chronic Care Management Pharmacy  Name: Debra Leach  MRN: [id number] DOB: 04/23/1951  Chief Complaint/ HPI  Benjamine Leach,  69 y.o., female presents for their Initial CCM visit with the clinical pharmacist via telephone.  PCP : [name], MD  Their chronic conditions include: migraine with aura, hypertension, GERD, hyperlipidemia, type 2 diabetes, osteoarthritis, osteopenia, insomnia, vitamin D deficiency, chronic low back pain, history of PE and DVT  Patient concerns: denies medication concerns   Office Visits:  10/30/19: PCP visit - chief complaint SOB, extremity weakness in legs, diabetes pt feels SOB is related to exertion only, walking 1 hour 5-7 days per week for the past 8 weeks, has to stop to rest due to leg weakness, pt concerned about myasthenia gravis; diabetes on chronic steroids, taking 61 units Lantus daily, plan: reduce insulin as you come down on budesonide, pcp to discuss myasthenia gravis with neurology   10/12/19: PCP visit for DM follow up, exercising more on stationary bike, having more anxiety lately, trial xanax, DM uncontrolled on 61 units qhs, but improving on last few weeks, continue   09/04/19: PCP visit for spot on right leg, SOB, referral to pulmonology, DM, increase insulin to 45 units then titrate by 2 units every 2 days until FBS < 120  Consult Visit:  11/09/19: GI Clinic - 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, nausea improved, occurring only one day per week, regurgitation improved on omeprazole 40 mg daily, liver enzymes normal 10/10/19, she is ready to decrease to budesonide 3 mg daily, rtc 2-3 months   09/11/19: Pulmonology - DOE thought to be anxiety related, trial Ativan, check PFTs, decrease Eliquis to 2.5 mg BID (completed 6 months full coag), CT chest   07/11/19: Neurology - CC chronic migraine, overall stable, continue current medications  07/09/19: Cardiology - DVT/PE 3 months ago, continued COB, worse with  exertion, denies chest pain, evaluate CAD with Lexiscan imagaing, LDL 183 on Lipitor 80 mg, add Repatha, BP controlled  Allergies  Allergen Reactions   Atenolol    Codeine     REACTION: Migraine   Penicillins Itching    Has patient had a PCN reaction causing immediate rash, facial/tongue/throat swelling, SOB or lightheadedness with hypotension: No Has patient had a PCN reaction causing severe rash involving mucus membranes or skin necrosis: No Has patient had a PCN reaction that required hospitalization: No Has patient had a PCN reaction occurring within the last 10 years: No If all of the above answers are "NO", then may proceed with Cephalosporin use.    Pentazocine Lactate     REACTION: Swelling, itching, rash   Propranolol Other (See Comments)   Crestor [Rosuvastatin] Other (See Comments)   Sudafed [Pseudoephedrine Hcl] Itching and Anxiety   Medications: Outpatient Encounter Medications as of 11/19/2019  Medication Sig   ALPRAZolam (XANAX) 0.25 MG tablet Take 1 tablet (0.25 mg total) by mouth daily as needed for anxiety.   apixaban (ELIQUIS) 2.5 MG TABS tablet Take 1 tablet (2.5 mg total) by mouth 2 (two) times daily.   atorvastatin (LIPITOR) 80 MG tablet TAKE 1 TABLET BY MOUTH DAILY.   Blood Glucose Monitoring Suppl (ONETOUCH VERIO REFLECT) w/Device KIT 2 (two) times daily. for testing   budesonide (ENTOCORT EC) 3 MG 24 hr capsule Take 6 mg by mouth daily.    cholecalciferol (VITAMIN D) 1000 units tablet Take 1,000 Units by mouth daily.   colesevelam (WELCHOL) 625 MG tablet TAKE 3 TABLETS BY MOUTH 2 TIMES DAILY  WITH A MEAL.   gabapentin (NEURONTIN) 300 MG capsule Take 2 capsules (600 mg total) by mouth at bedtime.   insulin glargine (LANTUS SOLOSTAR) 100 UNIT/ML Solostar Pen Inject 61 Units into the skin daily.   Insulin Pen Needle (ULTICARE SHORT PEN NEEDLES) 31G X 8 MM MISC USE TO INJECT INSULIN DAILY   Lancets (ONETOUCH DELICA PLUS QDIYME15A) MISC USE TO  CHECK BLOOD SUGAR TWO TIMES DAILY   losartan-hydrochlorothiazide (HYZAAR) 50-12.5 MG tablet TAKE 1 TABLET BY MOUTH DAILY.   omeprazole (PRILOSEC) 20 MG capsule Take 20 mg by mouth every morning.   ONETOUCH VERIO test strip USE TO CHECK BLOOD SUGAR TWO TIMES A DAY   potassium gluconate 595 (99 K) MG TABS tablet 595 mg daily.    promethazine (PHENERGAN) 12.5 MG tablet Take 12.5 mg by mouth every 6 (six) hours as needed for nausea or vomiting.   Ubrogepant (UBRELVY) 100 MG TABS Take 1 tablet by mouth as needed (May repeat dose after 2 hours if needed.  Maximum 2 tablets in 24 hours).   No facility-administered encounter medications on file as of 11/19/2019.   Current Diagnosis/Assessment:   Emergency planning/management officer Strain: Low Risk    Difficulty of Paying Living Expenses: Not hard at all   Goals     Patient Stated     04/06/2019, I will try to increase my exercise so I can lose some weight.      Pharmacy Care Plan     CARE PLAN ENTRY  Current Barriers:   Chronic Disease Management support, education, and care coordination needs related to Hypertension, Diabetes, and Vitamin D Deficiency    Hypertension  Pharmacist Clinical Goal(s): o Over the next 30 days, patient will work with PharmD and providers to achieve BP goal <140/90 mmHg  Current regimen:   Losartan-HCTZ 50-12.5 mg - 1 tablet daily  Interventions: o Discussed increasing losartan/HCTZ dose to target BP goal o Recommend assessing home blood pressure for 7 days before making dose adjustment  Patient self care activities - Over the next 30 days, patient will: o  Check blood pressure for 7 days leading up to next appointment in 4 weeks. Check BP before breakfast (and coffee) and before supper. Keep log of readings.  o Ensure daily salt intake < 2300 mg/day  Diabetes  Pharmacist Clinical Goal(s): o Over the next 30 days, patient will work with PharmD and providers to achieve A1c goal <7%  Current regimen:  o Lantus  - Inject 45 units daily and continue to reduce by 5 units every 4-7 days as long as fasting BG remains < 130 and post-prandial < 180  Interventions: o Reviewed current insulin dose and discussed safe taper schedule  Patient self care activities - Over the next 30  days, patient will: o Check blood sugar twice daily, document, and provide at future appointment with PharmD o Contact provider with any episodes of hypoglycemia  Vitamin D Deficiency   Pharmacist Clinical Goal(s) o Over the next 30 days, patient will work with PharmD and providers to improve vitamin D level within normal range   Current regimen:  o Vitamin D3 1000 IU - 1 capsule daily  Interventions: o Recommend increasing vitamin D3 to 2000 IU daily  Patient self care activities - Over the next 30 days, patient will: o Increase vitamin D3 to 2000 units daily   Initial goal documentation      Hypertension   CMP Latest Ref Rng & Units 10/10/2019 08/31/2019 07/10/2019  Glucose 70 -  99 mg/dL 108(H) 238(H) 123(H)  BUN 6 - 23 mg/dL _0 Creatinine 0.40 - 1.20 mg/dL 0.99 1.18(H) 1.17  Sodium 135 - 145 mEq/L 141 138 142  Potassium 3.5 - 5.1 mEq/L 3.9 3.6 3.9  Chloride 96 - 112 mEq/L 105 101 103  CO2 19 - 32 mEq/L _1 Calcium 8.4 - 10.5 mg/dL 8.8 8.9 9.2  Total Protein 6.0 - 8.3 g/dL 6.4 7.4 6.4  Total Bilirubin 0.2 - 1.2 mg/dL 0.4 0.5 0.4  Alkaline Phos 39 - 117 U/L 77 80 73  AST 0 - 37 U/L _2 ALT 0 - 35 U/L _3 Office blood pressures are: BP Readings from Last 3 Encounters:  10/30/19 140/70  10/12/19 (!) 144/78  09/11/19 (!) 144/72   Patient has failed these meds in the past: none reported Patient checks BP at home several times per month Patient home BP readings are ranging: most days low 140s/high 70s-80s   BP goal < 140/90 mmHg Patient is currently uncontrolled on the following medications:   Losartan-HCTZ 50-12.5 mg - 1 tablet daily  Med history: reports she was started on BP  medication Fall 2020, about the same time as DVT/PE Diet: does not add table salt, but does not restrict overall sodium intake  Exercise: unable to walk or use exercise bike regularly due to weakness in legs, easily winded even putting on shoes, walking across the room; symptoms started several months ago; reports tests for myasthenia gravis were normal   Caffeine: 1-1.5 cups coffee daily   We discussed: If home readings remain above 140 SBP, pt is open to increasing losartan/HCTZ  Plan: Continue current medications; Check blood pressure for 7 days leading up to next appointment. Check before breakfast and at bedtime. Will review log in 4 weeks. Watch sodium intake.   Hyperlipidemia   Lipid Panel     Component Value Date/Time   CHOL 231 (H) 10/10/2019 1010   TRIG 168.0 (H) 10/10/2019 1010   HDL 56.50 10/10/2019 1010   LDLCALC 141 (H) 10/10/2019 1010   LDLDIRECT 183.0 01/08/2019 0940    LDL goal < 100 Patient has failed these meds in past: zetia - ineffective, Crestor - intolerance  Patient is currently uncontrolled on the following medications:   Atorvastatin 80 mg - 1 tablet daily  Welchol 625 mg - 3 tablets BID with meals   Adherence: refills timely  We discussed:   Per chart, cardiology mentioned Repatha at previous visit, pt reports cardio wanted to get stress test first and test was clear so they decided to hold off on [place]  Denies family history of MI or strokes, mother passed away bone cancer at age 38; reports  family history of high cholesterol   Some swallowing issues for the past 5 years, Welchol can be difficult to swallow at times (has to skip a dose about once/month)   Reports she has been on atorvastatin and Welchol for a fairly long time; reports on Welchol on TG   Assessment: Uncontrolled on max dose atorvastatin. Zetia previously ineffective and cardio considering Repatha based on CV risk. No additional treatment options at this time.   Plan: Continue  current medications; Recommend continuing current treatment and targeting control of DM and HTN to lower CV risk.   Diabetes   Pt reports: Diagnosed with DM 7 years without medication, only needed medication once steroids initiated   Recent Relevant Labs: Lab Results  Component Value Date/Time  HGBA1C 8.6 (H) 10/10/2019 10:10 AM   HGBA1C 8.7 (H) 07/10/2019 08:33 AM   MICROALBUR 1.4 03/27/2018 10:49 AM   MICROALBUR <0.7 08/12/2016 10:35 AM    Checking BG: at least twice daily (morning before breakfast, 2 hours after breakfast)  Recent FBG Readings: this month all within goal, only 2 fasting readings high (goal < 120, high readings: 126, 137) Recent 2hr PP BG readings: this month all within goal, only 2 non-fasting high (goal < 180)  A1c goal < 7% Patient has failed these meds in past: none Patient is currently controlled on the following medications:   Lantus  - Inject 61 units qhs --> down to 45 units for about a week (decreasing by increments of 5 units*)  Last diabetic eye exam:  Lab Results  Component Value Date/Time   HMDIABEYEEXA No Retinopathy 09/14/2019 12:00 AM    Last diabetic foot exam:  Lab Results  Component Value Date/Time   HMDIABFOOTEX done 10/08/2019 12:00 AM    We discussed: patient is slowly tapering Lantus as her daily budesonide was reduced on 11/10/19, reports overall her BG stability has improved since then. She reduced from 61 units to 45 units of Lantus since 10/30/19 and readings remain in range (pt reports PCP would like her fasting BG < 120). Plans to continue to lows dose by 5 units a week and monitor BG.  Plan: Continue current medications; May continue to reduce Lantus as long as BG remains within goal ranges (fasting < 130, post-prandial < 180)  DVT/PE   CBC Latest Ref Rng & Units 10/10/2019 08/31/2019 06/19/2019  WBC 4.0 - 10.5 K/uL 10.0 12.6(H) 13.0(H)  Hemoglobin 12.0 - 15.0 g/dL 12.1 12.6 13.4  Hematocrit 36 - 46 % 37.4 38.8 42.7  Platelets 150  - 400 K/uL 353.0 363 372   Per patient: DVT/PE considered unprovoked, has seen hematologist and no cause identified   Patient has failed these meds in past: none reported Patient is currently on the following medications:   Eliquis 2.5 mg - 1 tablet BID  We discussed: CBC stable, indefinite anticoagulation per cardio, no cost concerns   Plan: Continue current medications  GERD   Patient has failed these meds in past: none reported Patient is currently controlled on the following medications:   Omeprazole 20 mg - 1 capsule daily   We discussed: recently had to increase to BID, but was able to reduce back down to once daily - swallowing is biggest concern, reports doing great right now   Plan: Continue current medications   Vitamin D Deficiency   Vitamin D 01/08/19: 26  Patient has failed these meds in past: none reported Patient is currently uncontrolled on the following medications:   Vitamin D 1000 IU - 1 capsule daily  We discussed: pt denies changes in vitamin D intake since 01/08/19, level below normal   Plan: Continue current medications; Recommend increasing vitamin D to 2000 IU daily.   Anxiety   Patient has failed these meds in past: lorazepam  Patient is currently controlled on the following medications:   Xanax 0.25 mg - 1 tablet daily PRN anxiety  We discussed: Patient denies taking any doses of Xanax so far (started in the past few months). Reports indication is for SOB/anxiety and stuttering. Denies feeling anxious when she is SOB, reports handling stressful situations well.   Plan: Continue current medications   Migraine   Reports migraines since age 25 and thinks stuttering could be associated with her migraines; Stuttering has  worsened to every word, sporadic, not daily, onset same time as DVT/PE   Followed by [facility] Patient has failed these meds in past: Emgality, Botox Patient is currently controlled on the following medications:   Ubrelvy 100  mg - 1 tablet daily PRN - max 2 doses in 24 hours (limits to 2 days/week)  Vyepti - first infusion tomorrow every 12 weeks   Gabapentin 300 mg - 2 capsules qhs   Gabapentin 100 mg - 1 capsule PRN migraine  Phenergan 12.5 mg - 1 tablet q6h PRN n/v  We discussed: stable, switching to Vyepti from [employer] and Botox tomorrow; reports gabapentin was the first medication she started for migraines, uses PRN dose for abortive treatment and daily for prevention   Plan: Continue current medications   Chronic Pain   Reports arthritis, mild symptoms  Patient has failed these meds in past: none reported Patient is currently controlled on the following medications:   No pharmacotherapy; denies OTC medication use   Plan: Continue current medications  Misc Meds/OTCs:    Potassium gluconate 595 mg - 1 tablet daily for muscle cramps (OTC)  We discussed: Pt reports starting potassium per PCP for muscle cramps. Reports cramps have not resolved. Cramps occur daily. Drinking a lot of water every day. Denies trrying anything else.   Plan: Refer to PCP for muscle cramps.   Med Management    Patient has failed these meds in past: very happy with current pharmacist, takes very good care of her Current Pharmacy: [facility]: offers delivery, currently picks up  Receives 90 DS - meds synced   Plan: Continue current med management straegy  CCM Follow Up: 1 month, telephone for BP log   Debbora Dus, PharmD Clinical Pharmacist [facility] at [facility] [phone]

## 2019-11-19 ENCOUNTER — Ambulatory Visit: Payer: Medicare Other

## 2019-11-19 ENCOUNTER — Other Ambulatory Visit: Payer: Self-pay

## 2019-11-19 DIAGNOSIS — E1159 Type 2 diabetes mellitus with other circulatory complications: Secondary | ICD-10-CM

## 2019-11-19 DIAGNOSIS — E1165 Type 2 diabetes mellitus with hyperglycemia: Secondary | ICD-10-CM

## 2019-11-19 DIAGNOSIS — I152 Hypertension secondary to endocrine disorders: Secondary | ICD-10-CM

## 2019-11-20 NOTE — Progress Notes (Signed)
[facility] sent update Infusion today Vyepti 100mg . Next infusion 02/12/20 10am

## 2019-11-21 ENCOUNTER — Inpatient Hospital Stay: Payer: Medicare Other | Attending: Oncology

## 2019-11-21 ENCOUNTER — Other Ambulatory Visit: Payer: Self-pay

## 2019-11-21 DIAGNOSIS — Z803 Family history of malignant neoplasm of breast: Secondary | ICD-10-CM | POA: Diagnosis not present

## 2019-11-21 DIAGNOSIS — Z87891 Personal history of nicotine dependence: Secondary | ICD-10-CM | POA: Diagnosis not present

## 2019-11-21 DIAGNOSIS — E119 Type 2 diabetes mellitus without complications: Secondary | ICD-10-CM | POA: Insufficient documentation

## 2019-11-21 DIAGNOSIS — R000 Tachycardia, unspecified: Secondary | ICD-10-CM | POA: Insufficient documentation

## 2019-11-21 DIAGNOSIS — Z7952 Long term (current) use of systemic steroids: Secondary | ICD-10-CM | POA: Diagnosis not present

## 2019-11-21 DIAGNOSIS — Z79899 Other long term (current) drug therapy: Secondary | ICD-10-CM | POA: Diagnosis not present

## 2019-11-21 DIAGNOSIS — Z7901 Long term (current) use of anticoagulants: Secondary | ICD-10-CM | POA: Insufficient documentation

## 2019-11-21 DIAGNOSIS — Z808 Family history of malignant neoplasm of other organs or systems: Secondary | ICD-10-CM | POA: Insufficient documentation

## 2019-11-21 DIAGNOSIS — Z794 Long term (current) use of insulin: Secondary | ICD-10-CM | POA: Diagnosis not present

## 2019-11-21 DIAGNOSIS — Z86718 Personal history of other venous thrombosis and embolism: Secondary | ICD-10-CM | POA: Diagnosis not present

## 2019-11-21 DIAGNOSIS — Z86711 Personal history of pulmonary embolism: Secondary | ICD-10-CM | POA: Insufficient documentation

## 2019-11-21 LAB — CBC WITH DIFFERENTIAL/PLATELET
Abs Immature Granulocytes: 0.07 10*3/uL (ref 0.00–0.07)
Basophils Absolute: 0.1 10*3/uL (ref 0.0–0.1)
Basophils Relative: 1 %
Eosinophils Absolute: 0.2 10*3/uL (ref 0.0–0.5)
Eosinophils Relative: 2 %
HCT: 38.8 % (ref 36.0–46.0)
Hemoglobin: 12.3 g/dL (ref 12.0–15.0)
Immature Granulocytes: 1 %
Lymphocytes Relative: 13 %
Lymphs Abs: 1.5 10*3/uL (ref 0.7–4.0)
MCH: 26.7 pg (ref 26.0–34.0)
MCHC: 31.7 g/dL (ref 30.0–36.0)
MCV: 84.3 fL (ref 80.0–100.0)
Monocytes Absolute: 0.7 10*3/uL (ref 0.1–1.0)
Monocytes Relative: 6 %
Neutro Abs: 9.1 10*3/uL — ABNORMAL HIGH (ref 1.7–7.7)
Neutrophils Relative %: 77 %
Platelets: 383 10*3/uL (ref 150–400)
RBC: 4.6 MIL/uL (ref 3.87–5.11)
RDW: 15.1 % (ref 11.5–15.5)
WBC: 11.7 10*3/uL — ABNORMAL HIGH (ref 4.0–10.5)
nRBC: 0 % (ref 0.0–0.2)

## 2019-11-21 LAB — COMPREHENSIVE METABOLIC PANEL
ALT: 22 U/L (ref 0–44)
AST: 20 U/L (ref 15–41)
Albumin: 3.3 g/dL — ABNORMAL LOW (ref 3.5–5.0)
Alkaline Phosphatase: 87 U/L (ref 38–126)
Anion gap: 12 (ref 5–15)
BUN: 16 mg/dL (ref 8–23)
CO2: 28 mmol/L (ref 22–32)
Calcium: 8.9 mg/dL (ref 8.9–10.3)
Chloride: 104 mmol/L (ref 98–111)
Creatinine, Ser: 1.04 mg/dL — ABNORMAL HIGH (ref 0.44–1.00)
GFR calc Af Amer: >60 mL/min (ref >60)
GFR calc non Af Amer: 55 mL/min — ABNORMAL LOW (ref >60)
Glucose, Bld: 119 mg/dL — ABNORMAL HIGH (ref 70–99)
Potassium: 3.4 mmol/L — ABNORMAL LOW (ref 3.5–5.1)
Sodium: 144 mmol/L (ref 135–145)
Total Bilirubin: 0.5 mg/dL (ref 0.3–1.2)
Total Protein: 7.4 g/dL (ref 6.5–8.1)

## 2019-11-21 LAB — FIBRIN DERIVATIVES D-DIMER (ARMC ONLY): Fibrin derivatives D-dimer (ARMC): 420.89 ng/mL (FEU) (ref 0.00–499.00)

## 2019-11-22 ENCOUNTER — Inpatient Hospital Stay: Payer: Medicare Other | Admitting: Oncology

## 2019-11-23 ENCOUNTER — Encounter: Payer: Self-pay | Admitting: Oncology

## 2019-11-23 ENCOUNTER — Ambulatory Visit: Payer: Medicare Other | Admitting: Neurology

## 2019-11-23 ENCOUNTER — Inpatient Hospital Stay (HOSPITAL_BASED_OUTPATIENT_CLINIC_OR_DEPARTMENT_OTHER): Payer: Medicare Other | Admitting: Oncology

## 2019-11-23 DIAGNOSIS — Z86718 Personal history of other venous thrombosis and embolism: Secondary | ICD-10-CM

## 2019-11-23 DIAGNOSIS — Z86711 Personal history of pulmonary embolism: Secondary | ICD-10-CM

## 2019-11-23 NOTE — Progress Notes (Signed)
Pt contacted for Mychart visit. No new concerns voiced.  

## 2019-11-23 NOTE — Progress Notes (Signed)
Hematology/Oncology follow up  note Debra Leach [phone] [phone]   Patient Care Team: [name], MD as PCP - General (Family Medicine) Pieter Partridge, DO as Consulting Physician (Neurology) Debbora Dus, [facility] as Pharmacist (Pharmacist)  REFERRING PROVIDER: [name], MD  CHIEF COMPLAINTS/REASON FOR VISIT:  Evaluation of pulmonary embolism.   HISTORY OF PRESENTING ILLNESS:  Debra Leach is a 68 y.o. female who was seen in consultation at the request of Diona Browner, Mervyn Gay, MD for evaluation of pulmonary embolism.  Patient was diagnosed with acute pulmonary embolism on 03/01/2019.Marland Kitchen Patient's presenting symptoms included: Dyspnea with exertion, lower extremity cramps.  At that time she also had hematuria. CT angio chest showed acute bilateral lower lobe and a right upper lobe segmental and subsegmental pulmonary emboli. Also noticed 1.2 cm left breast nodule. US venous duplex: Showed right lower extremity acute DVT involving right popliteal vein, right posterior tibial veins, and right peroneal veins. No cystic structure found in the popliteal fossa.  Left lower extremity no DVT .  Immobilization factors: Denies any immobilization factors including car ride, air flight, surgery, trauma. History of previous thrombosis event: Denies Family history of thrombosis: Denies any  Age appropriate cancer screening: Patient is up-to-date for mammogram on 02/01/2019 which showed no significant findings.  Colonoscopy 12/07/2012 which showed no colorectal neoplasm.  Internal hemorrhoids.  12/26/2014 EGD showed normal findings.  Patient was started on heparin for anticoagulation during admission and transition to Eliquis 10 mg twice daily for a week and transitioned to Eliquis 5 mg twice daily Patient reports that she had finished Eliquis 10 mg twice daily, and for 3 to 4 days, she misunderstood the instruction at took Eliquis 5 mg daily.  She  realized that and started taking Eliquis 5 mg twice daily and has been on current regimen for more than a week. She reports overall that her shortness of breath has significantly improved.  However she did notice yesterday, after walking a few laps, she felt winded.  Lower extremity cramp has completely resolved.  She lives with her husband at home, taking care of her mother.  Admits to some stress in life.  She also feels anxious coming to the cancer center today. She measures her blood pressure and heart rate at home and both were normal this morning. Today in the clinic, her blood pressure is slightly high 151/84, with a pulse rate of 117.  Denies any palpitation or chest pain.    INTERVAL HISTORY Debra Leach is a 69 y.o. female who has above history reviewed by me today presents for follow up visit for management of chronic anticoagulation for history of acute PE and lower extremity DVT. Problems and complaints are listed below: Patient is currently on Eliquis 2.90m BID for anticoagulation prophylaxis. She reports feeling well.  Some mild chronic SOB and she was seen by pulmonology Dr.Smith   Review of Systems  Constitutional: Negative for appetite change, chills, fatigue and fever.  HENT:   Negative for hearing loss and voice change.   Eyes: Negative for eye problems.  Respiratory: Positive for shortness of breath. Negative for chest tightness and cough.   Cardiovascular: Negative for chest pain.  Gastrointestinal: Negative for abdominal distention, abdominal pain and blood in stool.  Endocrine: Negative for hot flashes.  Genitourinary: Negative for difficulty urinating and frequency.   Musculoskeletal: Negative for arthralgias.  Skin: Negative for itching and rash.  Neurological: Negative for extremity weakness.  Hematological: Negative for adenopathy.  Psychiatric/Behavioral: Negative for confusion.    MEDICAL HISTORY:  Past Medical History:  Diagnosis Date  .  Anemia   . Arthritis    osteoarthritis B knees, left hip , right elbow  . Clotting disorder ([place])   . Complication of anesthesia    difficult waking  . Diabetes mellitus without complication ([place])    Diet and exercise controlled  . GERD (gastroesophageal reflux disease)   . Headache   . History of kidney stones   . Hyperlipidemia   . MVP (mitral valve prolapse)    history of  . PONV (postoperative nausea and vomiting)     SURGICAL HISTORY: Past Surgical History:  Procedure Laterality Date  . arm surgery    . BREAST BIOPSY Left   . BREAST SURGERY  2000   breast biopsy (benign)  . LITHOTRIPSY  08-12-2008   stent placed bilaterally  . PARTIAL HYSTERECTOMY     Both ovaries remain, vaginal, for mennorhagia  . TONSILLECTOMY    . TUBAL LIGATION  1980    SOCIAL HISTORY: Social History   Socioeconomic History  . Marital status: Married    Spouse name: Carloyn Manner  . Number of children: 0  . Years of education: Not on file  . Highest education level: Not on file  Occupational History  . Occupation: retired Energy manager: retired  Tobacco Use  . Smoking status: Former Research scientist (life sciences)  . Smokeless tobacco: Never Used  Vaping Use  . Vaping Use: Never used  Substance and Sexual Activity  . Alcohol use: No  . Drug use: No  . Sexual activity: Not on file  Other Topics Concern  . Not on file  Social History Narrative   Regular exercise--yes, recumbent bike 3-4 days a week      Diet: fruits and veggies, water, eats at home, drinks a lot of milk      Patient is right-handed. She drinks 1-2 cups of coffee a day.      One story home   Social Determinants of Health   Financial Resource Strain: Low Risk   . Difficulty of Paying Living Expenses: Not hard at all  Food Insecurity: No Food Insecurity  . Worried About Charity fundraiser in the Last Year: Never true  . Ran Out of Food in the Last Year: Never true  Transportation Needs: No Transportation Needs  . Lack of  Transportation (Medical): No  . Lack of Transportation (Non-Medical): No  Physical Activity: Inactive  . Days of Exercise per Week: 0 days  . Minutes of Exercise per Session: 0 min  Stress: No Stress Concern Present  . Feeling of Stress : Not at all  Social Connections:   . Frequency of Communication with Friends and Family:   . Frequency of Social Gatherings with Friends and Family:   . Attends Religious Services:   . Active Member of Clubs or Organizations:   . Attends Archivist Meetings:   Marland Kitchen Marital Status:   Intimate Partner Violence: Not At Risk  . Fear of Current or Ex-Partner: No  . Emotionally Abused: No  . Physically Abused: No  . Sexually Abused: No    FAMILY HISTORY: Family History  Problem Relation Age of Onset  . Cancer Mother        bone  . Hypertension Father   . Mitral valve prolapse Father   . Asthma Brother   . Arthritis Brother   . Nephrolithiasis Brother   . Nephrolithiasis Brother   .  Asthma Brother   . Arthritis Brother   . Aortic aneurysm Brother        ascending aortic aneuysm  . Breast cancer Maternal Aunt   . Breast cancer Maternal Aunt     ALLERGIES:  is allergic to atenolol, codeine, penicillins, pentazocine lactate, propranolol, crestor [rosuvastatin], and sudafed [pseudoephedrine hcl].  MEDICATIONS:  Current Outpatient Medications  Medication Sig Dispense Refill  . ALPRAZolam (XANAX) 0.25 MG tablet Take 1 tablet (0.25 mg total) by mouth daily as needed for anxiety. 20 tablet 0  . apixaban (ELIQUIS) 2.5 MG TABS tablet Take 1 tablet (2.5 mg total) by mouth 2 (two) times daily. 180 tablet 3  . atorvastatin (LIPITOR) 80 MG tablet TAKE 1 TABLET BY MOUTH DAILY. 90 tablet 3  . Blood Glucose Monitoring Suppl (ONETOUCH VERIO REFLECT) w/Device KIT 2 (two) times daily. for testing    . budesonide (ENTOCORT EC) 3 MG 24 hr capsule 1 tab daily    . cholecalciferol (VITAMIN D) 1000 units tablet Take 1,000 Units by mouth daily.    .  colesevelam (WELCHOL) 625 MG tablet TAKE 3 TABLETS BY MOUTH 2 TIMES DAILY WITH A MEAL. 540 tablet 3  . gabapentin (NEURONTIN) 100 MG capsule Take 100 mg by mouth 3 (three) times daily as needed.    . gabapentin (NEURONTIN) 300 MG capsule Take 2 capsules (600 mg total) by mouth at bedtime. 180 capsule 1  . insulin glargine (LANTUS SOLOSTAR) 100 UNIT/ML Solostar Pen Inject 61 Units into the skin daily. (Patient taking differently: Inject 45 Units into the skin daily. ) 15 mL 0  . Insulin Pen Needle (ULTICARE SHORT PEN NEEDLES) 31G X 8 MM MISC USE TO INJECT INSULIN DAILY 30 each 0  . Lancets (ONETOUCH DELICA PLUS [id number]) MISC USE TO CHECK BLOOD SUGAR TWO TIMES DAILY 100 each 12  . losartan-hydrochlorothiazide (HYZAAR) 50-12.5 MG tablet TAKE 1 TABLET BY MOUTH DAILY. 90 tablet 1  . omeprazole (PRILOSEC) 20 MG capsule Take 20 mg by mouth every morning.    Glory Rosebush VERIO test strip USE TO CHECK BLOOD SUGAR TWO TIMES A DAY 100 strip 12  . potassium gluconate 595 (99 K) MG TABS tablet 595 mg daily.     . promethazine (PHENERGAN) 12.5 MG tablet Take 12.5 mg by mouth every 6 (six) hours as needed for nausea or vomiting.    Marland Kitchen Ubrogepant (UBRELVY) 100 MG TABS Take 1 tablet by mouth as needed (May repeat dose after 2 hours if needed.  Maximum 2 tablets in 24 hours). 10 tablet 3   No current facility-administered medications for this visit.     PHYSICAL EXAMINATION: ECOG PERFORMANCE STATUS: 1 - Symptomatic but completely ambulatory There were no vitals filed for this visit. There were no vitals filed for this visit.  Physical Exam Constitutional:      General: She is not in acute distress. HENT:     Head: Normocephalic and atraumatic.  Eyes:     General: No scleral icterus.    Pupils: Pupils are equal, round, and reactive to light.  Cardiovascular:     Rate and Rhythm: Regular rhythm. Tachycardia present.     Heart sounds: Normal heart sounds.  Pulmonary:     Effort: Pulmonary effort is  normal. No respiratory distress.     Breath sounds: No wheezing.  Abdominal:     General: Bowel sounds are normal. There is no distension.     Palpations: Abdomen is soft. There is no mass.     Tenderness:  There is no abdominal tenderness.  Musculoskeletal:        General: No deformity. Normal range of motion.     Cervical back: Normal range of motion and neck supple.  Skin:    General: Skin is warm and dry.     Findings: No erythema or rash.  Neurological:     Mental Status: She is alert and oriented to person, place, and time.     Cranial Nerves: No cranial nerve deficit.     Coordination: Coordination normal.  Psychiatric:     Comments: Anxious     RADIOGRAPHIC STUDIES: I have personally reviewed the radiological images as listed and agreed with the findings in the report. No results found.   LABORATORY DATA:  I have reviewed the data as listed Lab Results  Component Value Date   WBC 11.7 (H) 11/21/2019   HGB 12.3 11/21/2019   HCT 38.8 11/21/2019   MCV 84.3 11/21/2019   PLT 383 11/21/2019   Recent Labs    06/19/19 1134 07/10/19 0833 08/31/19 1330 10/10/19 1010 11/21/19 1136  NA 140   < > 138 141 144  K 3.7   < > 3.6 3.9 3.4*  CL 102   < > 101 105 104  CO2 26   < > _0 GLUCOSE 160*   < > 238* 108* 119*  BUN 17   < > _1 CREATININE 1.08*   < > 1.18* 0.99 1.04*  CALCIUM 8.8*   < > 8.9 8.8 8.9  GFRNONAA 53*  --  47*  --  55*  GFRAA >60  --  55*  --  >60  PROT 7.4   < > 7.4 6.4 7.4  ALBUMIN 3.3*   < > 3.3* 3.5 3.3*  AST 24   < > _2 ALT 30   < > _3 ALKPHOS 74   < > 80 77 87  BILITOT 0.4   < > 0.5 0.4 0.5  BILIDIR  --   --   --  0.0  --    < > = values in this interval not displayed.   Iron/TIBC/Ferritin/ %Sat No results found for: IRON, TIBC, FERRITIN, IRONPCTSAT     ASSESSMENT & PLAN:  1. History of pulmonary embolism   2. History of DVT of lower extremity    #Chronic anticoagulation for history of PE and a DVT. She  tolerates Eliquis 2.3m BID.  Continue current regimen as long term prophylaxis  She prefers to be discharged from our clinic and be followed up by primary care provider.  All questions were answered. The patient knows to call the clinic with any problems questions or concerns.  ZEarlie Server MD, PhD 11/23/2019

## 2019-11-26 ENCOUNTER — Other Ambulatory Visit: Payer: Self-pay | Admitting: Family Medicine

## 2019-11-26 ENCOUNTER — Ambulatory Visit (INDEPENDENT_AMBULATORY_CARE_PROVIDER_SITE_OTHER): Payer: Medicare Other | Admitting: Psychology

## 2019-11-26 DIAGNOSIS — F4310 Post-traumatic stress disorder, unspecified: Secondary | ICD-10-CM

## 2019-11-26 DIAGNOSIS — F4323 Adjustment disorder with mixed anxiety and depressed mood: Secondary | ICD-10-CM

## 2019-11-27 LAB — MYASTHENIA GRAVIS PROFILE
AChR Binding Ab, Serum: <0.03 nmol/L (ref 0.00–0.24)
Acetylchol Block Ab: 23 % (ref 0–25)
Acetylcholine Modulat Ab: <12 % (ref 0–20)
Anti-striation Abs: NEGATIVE

## 2019-11-27 LAB — MUSK ANTIBODIES: MuSK Antibodies: <1 U/mL

## 2019-11-30 NOTE — Progress Notes (Signed)
Per [facility] last ov notes faxed over

## 2019-12-03 ENCOUNTER — Other Ambulatory Visit: Payer: Medicare Other

## 2019-12-05 ENCOUNTER — Telehealth: Payer: Self-pay

## 2019-12-05 ENCOUNTER — Ambulatory Visit (INDEPENDENT_AMBULATORY_CARE_PROVIDER_SITE_OTHER): Payer: Medicare Other | Admitting: Psychology

## 2019-12-05 ENCOUNTER — Ambulatory Visit: Payer: Medicare Other | Admitting: Oncology

## 2019-12-05 ENCOUNTER — Ambulatory Visit: Payer: Medicare Other | Admitting: Psychology

## 2019-12-05 DIAGNOSIS — F4310 Post-traumatic stress disorder, unspecified: Secondary | ICD-10-CM

## 2019-12-05 NOTE — Telephone Encounter (Signed)
PCP consult regarding CCM visit 11/16/19: Pt reports starting potassium 07/09/19 for muscle cramps. Reports cramps have not resolved and occur daily. Reports drinking a lot of water every day. Denies trying any other medications/supplements. Of note, potassium 3.4 on 11/21/19.   Currently taking: Potassium gluconate 595 mg - 1 tablet daily for muscle cramps (OTC)  She would like to know if there is anything else you recommend and if she should continue potassium.  Plan: Refer to PCP for muscle cramps.   Debbora Dus, PharmD Clinical Pharmacist [facility] at [facility] [phone]

## 2019-12-05 NOTE — Patient Instructions (Signed)
Dear [name],  It was a pleasure meeting you during our initial appointment on November 19, 2019. Below is a summary of the goals we discussed and components of chronic care management. Please contact me anytime with questions or concerns.   Visit Information  Goals Addressed            This Visit's Progress   . Pharmacy Care Plan       CARE PLAN ENTRY  Current Barriers:  . Chronic Disease Management support, education, and care coordination needs related to Hypertension, Diabetes, and Vitamin D Deficiency    Hypertension . Pharmacist Clinical Goal(s): o Over the next 30 days, patient will work with PharmD and providers to achieve BP goal <140/90 mmHg . Current regimen:   Losartan-HCTZ 50-12.5 mg - 1 tablet daily . Interventions: o Discussed increasing losartan/HCTZ dose to target BP goal o Recommend assessing home blood pressure for 7 days before making dose adjustment . Patient self care activities - Over the next 30 days, patient will: o  Check blood pressure for 7 days leading up to next appointment in 4 weeks. Check BP before breakfast (and coffee) and before supper. Keep log of readings.  o Ensure daily salt intake < 2300 mg/day  Diabetes . Pharmacist Clinical Goal(s): o Over the next 30 days, patient will work with PharmD and providers to achieve A1c goal <7% . Current regimen:  o Lantus - Inject 45 units daily and continue to reduce by 5 units every 4-7 days as long as fasting BG remains < 130 and post-prandial < 180 . Interventions: o Reviewed current insulin dose and discussed safe taper schedule . Patient self care activities - Over the next 30  days, patient will: o Check blood sugar twice daily, document, and provide at future appointment with PharmD o Contact provider with any episodes of hypoglycemia  Vitamin D Deficiency  . Pharmacist Clinical Goal(s) o Over the next 30 days, patient will work with PharmD and providers to improve vitamin D level  within normal range  . Current regimen:  o Vitamin D3 1000 IU - 1 capsule daily . Interventions: o Recommend increasing vitamin D3 to 2000 IU daily . Patient self care activities - Over the next 30 days, patient will: o Increase vitamin D3 to 2000 units daily   Initial goal documentation       Ms. Scotti was given information about Chronic Care Management services today including:  1. CCM service includes personalized support from designated clinical staff supervised by her physician, including individualized plan of care and coordination with other care providers 2. 24/7 contact phone numbers for assistance for urgent and routine care needs. 3. Standard insurance, coinsurance, copays and deductibles apply for chronic care management only during months in which we provide at least 20 minutes of these services. Most insurances cover these services at 100%, however patients may be responsible for any copay, coinsurance and/or deductible if applicable. This service may help you avoid the need for more expensive face-to-face services. 4. Only one practitioner may furnish and bill the service in a calendar month. 5. The patient may stop CCM services at any time (effective at the end of the month) by phone call to the office staff.  Patient agreed to services and verbal consent obtained.   The patient verbalized understanding of instructions provided today and agreed to receive a mailed copy of patient instruction and/or educational materials. Telephone follow up appointment with pharmacy team member scheduled for: December 17, 2019  at 9:00 AM (telephone) to review blood pressure log. Please check blood pressure 7 days prior to visit.  Debbora Dus, PharmD Clinical Pharmacist [facility] at [facility] [phone]  [place] stands for "Dietary Approaches to Stop Hypertension." The DASH eating plan is a healthy eating plan that has been shown to reduce high blood  pressure (hypertension). It may also reduce your risk for type 2 diabetes, heart disease, and stroke. The DASH eating plan may also help with weight loss. What are tips for following this plan?  General guidelines  Avoid eating more than 2,300 mg (milligrams) of salt (sodium) a day. If you have hypertension, you may need to reduce your sodium intake to 1,500 mg a day.  Limit alcohol intake to no more than 1 drink a day for nonpregnant women and 2 drinks a day for men. One drink equals 12 oz of beer, 5 oz of wine, or 1 oz of hard liquor.  Work with your health care provider to maintain a healthy body weight or to lose weight. Ask what an ideal weight is for you.  Get at least 30 minutes of exercise that causes your heart to beat faster (aerobic exercise) most days of the week. Activities may include walking, swimming, or biking.  Work with your health care provider or diet and nutrition specialist (dietitian) to adjust your eating plan to your individual calorie needs. Reading food labels   Check food labels for the amount of sodium per serving. Choose foods with less than 5 percent of the Daily Value of sodium. Generally, foods with less than 300 mg of sodium per serving fit into this eating plan.  To find whole grains, look for the word "whole" as the first word in the ingredient list. Shopping  Buy products labeled as "low-sodium" or "no salt added."  Buy fresh foods. Avoid canned foods and premade or frozen meals. Cooking  Avoid adding salt when cooking. Use salt-free seasonings or herbs instead of table salt or sea salt. Check with your health care provider or pharmacist before using salt substitutes.  Do not fry foods. Cook foods using healthy methods such as baking, boiling, grilling, and broiling instead.  Cook with heart-healthy oils, such as olive, canola, soybean, or sunflower oil. Meal planning  Eat a balanced diet that includes: ? 5 or more servings of fruits and  vegetables each day. At each meal, try to fill half of your plate with fruits and vegetables. ? Up to 6-8 servings of whole grains each day. ? Less than 6 oz of lean meat, poultry, or fish each day. A 3-oz serving of meat is about the same size as a deck of cards. One egg equals 1 oz. ? 2 servings of low-fat dairy each day. ? A serving of nuts, seeds, or beans 5 times each week. ? Heart-healthy fats. Healthy fats called Omega-3 fatty acids are found in foods such as flaxseeds and coldwater fish, like sardines, salmon, and mackerel.  Limit how much you eat of the following: ? Canned or prepackaged foods. ? Food that is high in trans fat, such as fried foods. ? Food that is high in saturated fat, such as fatty meat. ? Sweets, desserts, sugary drinks, and other foods with added sugar. ? Full-fat dairy products.  Do not salt foods before eating.  Try to eat at least 2 vegetarian meals each week.  Eat more home-cooked food and less restaurant, buffet, and fast food.  When eating at a  restaurant, ask that your food be prepared with less salt or no salt, if possible. What foods are recommended? The items listed may not be a complete list. Talk with your dietitian about what dietary choices are best for you. Grains Whole-grain or whole-wheat bread. Whole-grain or whole-wheat pasta. Brown rice. [place]. Bulgur. Whole-grain and low-sodium cereals. Pita bread. Low-fat, low-sodium crackers. Whole-wheat flour tortillas. Vegetables Fresh or frozen vegetables (raw, steamed, roasted, or grilled). Low-sodium or reduced-sodium tomato and vegetable juice. Low-sodium or reduced-sodium tomato sauce and tomato paste. Low-sodium or reduced-sodium canned vegetables. Fruits All fresh, dried, or frozen fruit. Canned fruit in natural juice (without added sugar). Meat and other protein foods Skinless chicken or [place]. Ground chicken or [place]. Pork with fat trimmed off. Fish and seafood. Egg whites. Dried  beans, peas, or lentils. Unsalted nuts, nut butters, and seeds. Unsalted canned beans. Lean cuts of beef with fat trimmed off. Low-sodium, lean deli meat. Dairy Low-fat (1%) or fat-free (skim) milk. Fat-free, low-fat, or reduced-fat cheeses. Nonfat, low-sodium ricotta or cottage cheese. Low-fat or nonfat yogurt. Low-fat, low-sodium cheese. Fats and oils Soft margarine without trans fats. Vegetable oil. Low-fat, reduced-fat, or light mayonnaise and salad dressings (reduced-sodium). Canola, safflower, olive, soybean, and sunflower oils. Avocado. Seasoning and other foods Herbs. Spices. Seasoning mixes without salt. Unsalted popcorn and pretzels. Fat-free sweets. What foods are not recommended? The items listed may not be a complete list. Talk with your dietitian about what dietary choices are best for you. Grains Baked goods made with fat, such as croissants, muffins, or some breads. Dry pasta or rice meal packs. Vegetables Creamed or fried vegetables. Vegetables in a cheese sauce. Regular canned vegetables (not low-sodium or reduced-sodium). Regular canned tomato sauce and paste (not low-sodium or reduced-sodium). Regular tomato and vegetable juice (not low-sodium or reduced-sodium). Angie Fava. Olives. Fruits Canned fruit in a light or heavy syrup. Fried fruit. Fruit in cream or butter sauce. Meat and other protein foods Fatty cuts of meat. Ribs. Fried meat. Berniece Salines. Sausage. Bologna and other processed lunch meats. Salami. Fatback. Hotdogs. Bratwurst. Salted nuts and seeds. Canned beans with added salt. Canned or smoked fish. Whole eggs or egg yolks. Chicken or [place] with skin. Dairy Whole or 2% milk, cream, and half-and-half. Whole or full-fat cream cheese. Whole-fat or sweetened yogurt. Full-fat cheese. Nondairy creamers. Whipped toppings. Processed cheese and cheese spreads. Fats and oils Butter. Stick margarine. Lard. Shortening. Ghee. Bacon fat. Tropical oils, such as coconut, palm kernel, or  palm oil. Seasoning and other foods Salted popcorn and pretzels. Onion salt, garlic salt, seasoned salt, table salt, and sea salt. Worcestershire sauce. Tartar sauce. Barbecue sauce. Teriyaki sauce. Soy sauce, including reduced-sodium. Steak sauce. Canned and packaged gravies. Fish sauce. Oyster sauce. Cocktail sauce. Horseradish that you find on the shelf. Ketchup. Mustard. Meat flavorings and tenderizers. Bouillon cubes. Hot sauce and Tabasco sauce. Premade or packaged marinades. Premade or packaged taco seasonings. Relishes. Regular salad dressings. Where to find more information:  National Heart, Lung, and [place]: https://wilson-eaton.com/  American Heart Association: www.heart.org Summary  The DASH eating plan is a healthy eating plan that has been shown to reduce high blood pressure (hypertension). It may also reduce your risk for type 2 diabetes, heart disease, and stroke.  With the DASH eating plan, you should limit salt (sodium) intake to 2,300 mg a day. If you have hypertension, you may need to reduce your sodium intake to 1,500 mg a day.  When on the DASH eating plan, aim to eat more  fresh fruits and vegetables, whole grains, lean proteins, low-fat dairy, and heart-healthy fats.  Work with your health care provider or diet and nutrition specialist (dietitian) to adjust your eating plan to your individual calorie needs. This information is not intended to replace advice given to you by your health care provider. Make sure you discuss any questions you have with your health care provider. Document Revised: 05/06/2017 Document Reviewed: 05/17/2016 Elsevier Patient Education  2020 [employer].

## 2019-12-07 NOTE — Telephone Encounter (Signed)
Continue potassium.. will discuss at next follow up.

## 2019-12-12 ENCOUNTER — Ambulatory Visit: Payer: Medicare Other | Admitting: Psychology

## 2019-12-17 ENCOUNTER — Telehealth: Payer: Medicare Other

## 2019-12-19 ENCOUNTER — Ambulatory Visit
Admission: RE | Admit: 2019-12-19 | Discharge: 2019-12-19 | Disposition: A | Discharge status: Home / Self Care | Payer: Medicare Other | Source: Ambulatory Visit | Attending: Internal Medicine | Admitting: Internal Medicine

## 2019-12-19 ENCOUNTER — Other Ambulatory Visit: Payer: Self-pay

## 2019-12-19 ENCOUNTER — Telehealth: Payer: Self-pay

## 2019-12-19 ENCOUNTER — Telehealth: Payer: Self-pay | Admitting: Internal Medicine

## 2019-12-19 ENCOUNTER — Ambulatory Visit: Payer: Medicare Other | Admitting: Psychology

## 2019-12-19 DIAGNOSIS — R591 Generalized enlarged lymph nodes: Secondary | ICD-10-CM | POA: Insufficient documentation

## 2019-12-19 DIAGNOSIS — K862 Cyst of pancreas: Secondary | ICD-10-CM

## 2019-12-19 DIAGNOSIS — R599 Enlarged lymph nodes, unspecified: Secondary | ICD-10-CM

## 2019-12-19 HISTORY — DX: Essential (primary) hypertension: I10

## 2019-12-19 MED ORDER — IOHEXOL 300 MG/ML  SOLN
75.0000 mL | Freq: Once | INTRAMUSCULAR | Status: AC | PRN
  Administered 2019-12-19: 75 mL via INTRAVENOUS

## 2019-12-19 NOTE — Telephone Encounter (Signed)
Called patient regarding CT chest results. F/u with [place] can be PRN She wants me to reach out to her GI Dr. Regarding possible pancreas mass, I called and there was no answer.  I will send a message to her PCP and GI doctor about this through epic if possible and have ordered an MRCP to further evaluate this potential mass.  Erskine Emery MD PCCM

## 2019-12-19 NOTE — Telephone Encounter (Signed)
Call report: Dr. Tamala Julian   IMPRESSION: Stable nonspecific mediastinal and hilar lymph nodes, favored to represent reactive or granulomatous disease. No enlarging or bulky adenopathy appreciated.  No other acute intrathoracic finding. Similar inferior right middle lobe and bibasilar scarring/atelectasis.  Moderate hiatal hernia, unchanged  1.5 cm hypodense right thyroid nodule. Recommend follow-up thyroid ultrasound non emergently for further characterization.  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 Recommend dedicated complete imaging of the pancreas with pancreas CT or MRI protocol.  These results will be called to the ordering clinician or representative by the Radiologist Assistant, and communication documented in the PACS or [employer].  Aortic Atherosclerosis (ICD10-I70.0).

## 2019-12-24 ENCOUNTER — Ambulatory Visit: Payer: Medicare Other | Admitting: Primary Care

## 2019-12-26 ENCOUNTER — Ambulatory Visit: Payer: Medicare Other | Admitting: Internal Medicine

## 2019-12-26 ENCOUNTER — Ambulatory Visit: Payer: Medicare Other | Admitting: Psychology

## 2019-12-27 ENCOUNTER — Ambulatory Visit: Payer: Medicare Other | Admitting: Internal Medicine

## 2020-01-02 ENCOUNTER — Ambulatory Visit: Payer: Medicare Other | Admitting: Psychology

## 2020-01-04 ENCOUNTER — Other Ambulatory Visit: Payer: Self-pay

## 2020-01-04 ENCOUNTER — Ambulatory Visit
Admission: RE | Admit: 2020-01-04 | Discharge: 2020-01-04 | Disposition: A | Discharge status: Home / Self Care | Payer: Medicare Other | Source: Ambulatory Visit | Attending: Internal Medicine | Admitting: Internal Medicine

## 2020-01-04 DIAGNOSIS — K862 Cyst of pancreas: Secondary | ICD-10-CM | POA: Insufficient documentation

## 2020-01-04 MED ORDER — GADOBUTROL 1 MMOL/ML IV SOLN
7.0000 mL | Freq: Once | INTRAVENOUS | Status: AC | PRN
  Administered 2020-01-04: 7 mL via INTRAVENOUS

## 2020-01-07 NOTE — Telephone Encounter (Signed)
Dr. Tamala Julian, please see mychart message from pt as an FYI.

## 2020-01-08 ENCOUNTER — Telehealth: Payer: Self-pay | Admitting: Family Medicine

## 2020-01-08 ENCOUNTER — Other Ambulatory Visit: Payer: Self-pay

## 2020-01-08 ENCOUNTER — Other Ambulatory Visit (INDEPENDENT_AMBULATORY_CARE_PROVIDER_SITE_OTHER): Payer: Medicare Other

## 2020-01-08 DIAGNOSIS — E1165 Type 2 diabetes mellitus with hyperglycemia: Secondary | ICD-10-CM | POA: Diagnosis not present

## 2020-01-08 LAB — COMPREHENSIVE METABOLIC PANEL
ALT: 13 U/L (ref 0–35)
AST: 13 U/L (ref 0–37)
Albumin: 3.4 g/dL — ABNORMAL LOW (ref 3.5–5.2)
Alkaline Phosphatase: 78 U/L (ref 39–117)
BUN: 16 mg/dL (ref 6–23)
CO2: 31 mEq/L (ref 19–32)
Calcium: 9 mg/dL (ref 8.4–10.5)
Chloride: 105 mEq/L (ref 96–112)
Creatinine, Ser: 0.91 mg/dL (ref 0.40–1.20)
GFR: 61.35 mL/min (ref >60.00)
Glucose, Bld: 116 mg/dL — ABNORMAL HIGH (ref 70–99)
Potassium: 3.8 mEq/L (ref 3.5–5.1)
Sodium: 140 mEq/L (ref 135–145)
Total Bilirubin: 0.3 mg/dL (ref 0.2–1.2)
Total Protein: 6.7 g/dL (ref 6.0–8.3)

## 2020-01-08 LAB — LIPID PANEL
Cholesterol: 201 mg/dL — ABNORMAL HIGH (ref 0–200)
HDL: 50.9 mg/dL (ref >39.00)
LDL Cholesterol: 125 mg/dL — ABNORMAL HIGH (ref 0–99)
NonHDL: 150.23
Total CHOL/HDL Ratio: 4
Triglycerides: 125 mg/dL (ref 0.0–149.0)
VLDL: 25 mg/dL (ref 0.0–40.0)

## 2020-01-08 LAB — HEMOGLOBIN A1C: Hgb A1c MFr Bld: 8.4 % — ABNORMAL HIGH (ref 4.6–6.5)

## 2020-01-08 NOTE — Progress Notes (Signed)
No critical labs need to be addressed urgently. We will discuss labs in detail at upcoming office visit.   

## 2020-01-08 NOTE — Telephone Encounter (Signed)
-----   Message from Ellamae Sia sent at 12/26/2019  9:10 AM EDT ----- Regarding: Lab orders for Tuesday, 8.3.21 Lab for f/u

## 2020-01-08 NOTE — Progress Notes (Signed)
NEUROLOGY FOLLOW UP OFFICE NOTE  Dajana Gehrig [id number]  HISTORY OF PRESENT ILLNESS: Debra Leach is a 69 year old right-handed female with autoimmune pancreatitis, migraines, hyperlipidemia, diabetes, GERD, osteoarthritis, and history of MVP and renal stones who follows up for migraines.  UPDATE: Since last visit, we discontinued Emgality and Botox.  She started Vyepti.  She received her first dose of Vyepti on 6/15.  For the 76 days prior to [facility], she had 7 headache-free days.  For the 42 days after Vyepti, she has had 7 headache-free days.  She has not had any headaches greater than 6/10.  She has only needed Ubrelvy once.  Duration of headaches on average 2 to 4 hours (has had 5 and 7 hours as well).  She has seen improvement despite stressful time.  Her mother-in-law passed away.  Also, her prednisone has been reduced to the lowest dose so she has had some increased nausea.  Current NSAIDS/steroid:budesomide 49m daily Current analgesics:None Current triptans:None Current ergotamine:None Current anti-emetic:Promethazine 12.5 mg Current muscle relaxants:None Current anti-anxiolytic:None Current sleep aide:None Current Antihypertensive medications:None Current Antidepressant medications:None Current Anticonvulsant medications:Gabapentin3037mtwice daily Current anti-CGRP:Vyepti; Ubrelvy Current Vitamins/Herbal/Supplements:Riboflavin 400 mg daily, vitamin D Current Antihistamines/Decongestants:None Other therapy:Cefaly, ice pack Other medication:Meclizine  Caffeine:1 cup of coffee daily Alcohol:None Smoker:No Diet:Follows LEAP ImmunoCalm Dietary Program. Hydrates. Exercise:When tolerated (knee problems). She walks and bikes. Depression:No; Anxiety:No Other pain:no Sleep hygiene:Improved  HISTORY: Onset: [age]ears old Location:Varies (unilateral either side, frontal-temporal, back of head) Quality:Varies (stabbing,  pounding, throbbing) Initial Intensity:10/10 severe, otherwise 5-7/10 Aura:Occurs off and on over the years and varies in semiology.In early 1970s, black out.In late 1980s, white out.In late 1990s, flashing lights and zigzag lines.Since 2016, scintillating scotoma (occurs 1 to 2 times a month) Prodrome:no Associated symptoms: Initially vomiting.Sometimes nausea.Photophobia, phonophobia.Dizziness. Initial Duration:All day Initial Frequency:daily Triggers: Change in weather, emotional stress, valsalva maneuver, odors, certain foods Relieving factors: ice, rest Activity:Difficult to function if severe  Past NSAIDS:diclofenac 5025mCambia, Mobic 1m67mbuprofen, naproxen, toradol  Past analgesics:tramadol (reaction), Midrin, Excedrin Past abortive triptans/ergots:Treximet, Axert, Amerge, Frova, Maxalt, Relpax, Zomig tablet, DHE NS, sumatriptan 100mg28mmatriptan 6mg S60mast anti-emetic: Zofran 4mg Pa84manxiolytic:Buspirone, clonazepam Past antihypertensive medications:Metoprolol, Norvasc, propranolol ER 120mg, a51mlol 100mg (si53mffects dizziness, myalgias, acid reflux) Past antidepressant medications:Venlafaxine, sertraline, amitriptyline, amoxapine, duloxetine, nortriptyline, imipramine, Luvox, Remeron, Serzone, bupropion, desipramine, doxepin, Vivactil Past anticonvulsant medications:Depakote, Topamax, zonisamide, Lamictal, Keppra, Lyrica, gabapentin 300/300/600 Past anti--CGRP: Aimovig, Emgality Past vitamins/Herbal/Supplements:butterbur, Mg, CoQ10 Other past treatments:Botox, Cefaly Device, methylergonovine, acupuncture, biofeedback, Seroquel, Thorazine, methylsergide maleate  Family history of headache:Maybe her mother's side.  Prior brain MRI normal (date unknown but report is mentioned in prior notes).  Abnormal Movements: In December 2017, we started atenolol for migraine prevention. It worked well, but due to reported side  effects of dizziness, acid reflux and joint and muscle pain, she was tapered off of it in early April. Around that time, she developed shaking episodes or tremors associated with her migraines. She followed up with her PCP, Dr. Bedsole, Diona Browner18 and we had her discontinue the gabapentin. She hasn't had any recurrent tremors, however she says she has gone up to 4 days at a time without an attack. Since discontinuing the gabapentin, the intensity of her daily headaches have gotten worse.   I reviewed the video of her habitual attacks. In the video, she exhibits flapping of her right hand, arrhythmic and without tremor or choreiform movement. It is not an arrhythmic jerking, consistent with myoclonus. It sometimes involves the left  hand as well. She denies stress and anxiety.  PAST MEDICAL HISTORY: Past Medical History:  Diagnosis Date  . Anemia   . Arthritis    osteoarthritis B knees, left hip , right elbow  . Clotting disorder ([place])   . Complication of anesthesia    difficult waking  . Diabetes mellitus without complication ([place])    Diet and exercise controlled  . GERD (gastroesophageal reflux disease)   . Headache   . History of kidney stones   . Hyperlipidemia   . Hypertension   . MVP (mitral valve prolapse)    history of  . PONV (postoperative nausea and vomiting)     MEDICATIONS: Current Outpatient Medications on File Prior to Visit  Medication Sig Dispense Refill  . ALPRAZolam (XANAX) 0.25 MG tablet Take 1 tablet (0.25 mg total) by mouth daily as needed for anxiety. 20 tablet 0  . apixaban (ELIQUIS) 2.5 MG TABS tablet Take 1 tablet (2.5 mg total) by mouth 2 (two) times daily. 180 tablet 3  . atorvastatin (LIPITOR) 80 MG tablet TAKE 1 TABLET BY MOUTH DAILY. 90 tablet 3  . Blood Glucose Monitoring Suppl (ONETOUCH VERIO REFLECT) w/Device KIT 2 (two) times daily. for testing    . budesonide (ENTOCORT EC) 3 MG 24 hr capsule 1 tab daily    . cholecalciferol (VITAMIN D) 1000  units tablet Take 1,000 Units by mouth daily.    . colesevelam (WELCHOL) 625 MG tablet TAKE 3 TABLETS BY MOUTH 2 TIMES DAILY WITH A MEAL. 540 tablet 3  . gabapentin (NEURONTIN) 100 MG capsule Take 100 mg by mouth 3 (three) times daily as needed.    . gabapentin (NEURONTIN) 300 MG capsule Take 2 capsules (600 mg total) by mouth at bedtime. 180 capsule 1  . insulin glargine (LANTUS SOLOSTAR) 100 UNIT/ML Solostar Pen Inject 61 Units into the skin daily. (Patient taking differently: Inject 45 Units into the skin daily. ) 15 mL 0  . Insulin Pen Needle (ULTICARE SHORT PEN NEEDLES) 31G X 8 MM MISC USE TO INJECT INSULIN DAILY 30 each 0  . Lancets (ONETOUCH DELICA PLUS [id number]) MISC USE TO CHECK BLOOD SUGAR TWO TIMES DAILY 100 each 12  . losartan-hydrochlorothiazide (HYZAAR) 50-12.5 MG tablet TAKE 1 TABLET BY MOUTH DAILY. 90 tablet 1  . omeprazole (PRILOSEC) 20 MG capsule Take 1 capsule (20 mg total) by mouth daily. 90 capsule 3  . ONETOUCH VERIO test strip USE TO CHECK BLOOD SUGAR TWO TIMES A DAY 100 strip 12  . potassium gluconate 595 (99 K) MG TABS tablet 595 mg daily.     . promethazine (PHENERGAN) 12.5 MG tablet Take 12.5 mg by mouth every 6 (six) hours as needed for nausea or vomiting.    Marland Kitchen Ubrogepant (UBRELVY) 100 MG TABS Take 1 tablet by mouth as needed (May repeat dose after 2 hours if needed.  Maximum 2 tablets in 24 hours). 10 tablet 3   No current facility-administered medications on file prior to visit.    ALLERGIES: Allergies  Allergen Reactions  . Atenolol   . Codeine     REACTION: Migraine  . Penicillins Itching    Has patient had a PCN reaction causing immediate rash, facial/tongue/throat swelling, SOB or lightheadedness with hypotension: No Has patient had a PCN reaction causing severe rash involving mucus membranes or skin necrosis: No Has patient had a PCN reaction that required hospitalization: No Has patient had a PCN reaction occurring within the last 10 years: No If all  of the above  answers are "NO", then may proceed with Cephalosporin use.   Marland Kitchen Pentazocine Lactate     REACTION: Swelling, itching, rash  . Propranolol Other (See Comments)  . Crestor [Rosuvastatin] Other (See Comments)  . Sudafed [Pseudoephedrine Hcl] Itching and Anxiety    FAMILY HISTORY: Family History  Problem Relation Age of Onset  . Cancer Mother        bone  . Hypertension Father   . Mitral valve prolapse Father   . Asthma Brother   . Arthritis Brother   . Nephrolithiasis Brother   . Nephrolithiasis Brother   . Asthma Brother   . Arthritis Brother   . Aortic aneurysm Brother        ascending aortic aneuysm  . Breast cancer Maternal Aunt   . Breast cancer Maternal Aunt    SOCIAL HISTORY: Social History   Socioeconomic History  . Marital status: Married    Spouse name: Carloyn Manner  . Number of children: 0  . Years of education: Not on file  . Highest education level: Not on file  Occupational History  . Occupation: retired Energy manager: retired  Tobacco Use  . Smoking status: Former Research scientist (life sciences)  . Smokeless tobacco: Never Used  Vaping Use  . Vaping Use: Never used  Substance and Sexual Activity  . Alcohol use: No  . Drug use: No  . Sexual activity: Not on file  Other Topics Concern  . Not on file  Social History Narrative   Regular exercise--yes, recumbent bike 3-4 days a week      Diet: fruits and veggies, water, eats at home, drinks a lot of milk      Patient is right-handed. She drinks 1-2 cups of coffee a day.      One story home   Social Determinants of Health   Financial Resource Strain: Low Risk   . Difficulty of Paying Living Expenses: Not hard at all  Food Insecurity: No Food Insecurity  . Worried About Charity fundraiser in the Last Year: Never true  . Ran Out of Food in the Last Year: Never true  Transportation Needs: No Transportation Needs  . Lack of Transportation (Medical): No  . Lack of Transportation (Non-Medical): No    Physical Activity: Inactive  . Days of Exercise per Week: 0 days  . Minutes of Exercise per Session: 0 min  Stress: No Stress Concern Present  . Feeling of Stress : Not at all  Social Connections:   . Frequency of Communication with Friends and Family:   . Frequency of Social Gatherings with Friends and Family:   . Attends Religious Services:   . Active Member of Clubs or Organizations:   . Attends Archivist Meetings:   Marland Kitchen Marital Status:   Intimate Partner Violence: Not At Risk  . Fear of Current or Ex-Partner: No  . Emotionally Abused: No  . Physically Abused: No  . Sexually Abused: No    PHYSICAL EXAM: Blood pressure 136/75, pulse 76, height 5' 1" (1.549 m), weight 150 lb (68 kg), SpO2 99 %. General: No acute distress.  Patient appears well-groomed.   Head:  Normocephalic/atraumatic Eyes:  Fundi examined but not visualized Neck: supple, no paraspinal tenderness, full range of motion Heart:  Regular rate and rhythm Lungs:  Clear to auscultation bilaterally Back: No paraspinal tenderness Neurological Exam: alert and oriented to person, place, and time. Attention span and concentration intact, recent and remote memory intact, fund of knowledge intact.  Speech  fluent and not dysarthric, language intact.  CN II-XII intact. Bulk and tone normal, muscle strength 5/5 throughout.  Sensation to light touch, temperature and vibration intact.  Deep tendon reflexes 2+ throughout, toes downgoing.  Finger to nose and heel to shin testing intact.  Gait normal, Romberg negative.  IMPRESSION: Chronic migraine without aura, without status migrainosus, not intractable  PLAN: 1.  Vyepti  2.  Ubrelvy for rescue 3.  Follow up in 6 months  Metta Clines, DO  CC: Eliezer Lofts, MD

## 2020-01-09 ENCOUNTER — Encounter: Payer: Self-pay | Admitting: Neurology

## 2020-01-09 ENCOUNTER — Ambulatory Visit (INDEPENDENT_AMBULATORY_CARE_PROVIDER_SITE_OTHER): Payer: Medicare Other | Admitting: Neurology

## 2020-01-09 VITALS — BP 136/75 | HR 76 | Ht 61.0 in | Wt 150.0 lb

## 2020-01-09 DIAGNOSIS — G43709 Chronic migraine without aura, not intractable, without status migrainosus: Secondary | ICD-10-CM | POA: Diagnosis not present

## 2020-01-09 NOTE — Patient Instructions (Signed)
  1. Continue Vyepti 2. If needed, take Ubrelvy 3. Limit use of pain relievers to no more than 2 days out of the week.  These medications include acetaminophen, NSAIDs (ibuprofen/Advil/Motrin, naproxen/Aleve, triptans (Imitrex/sumatriptan), Excedrin, and narcotics.  This will help reduce risk of rebound headaches. 4. Be aware of common food triggers:  - Caffeine:  coffee, black tea, cola, Mt. Dew  - Chocolate  - Dairy:  aged cheeses (brie, blue, cheddar, gouda, [place], provolone, [place], Swiss, etc), chocolate milk, buttermilk, sour cream, limit eggs and yogurt  - Nuts, peanut butter  - Alcohol  - Cereals/grains:  FRESH breads (fresh bagels, sourdough, doughnuts), yeast productions  - Processed/canned/aged/cured meats (pre-packaged deli meats, hotdogs)  - MSG/glutamate:  soy sauce, flavor enhancer, pickled/preserved/marinated foods  - Sweeteners:  aspartame (Equal, Nutrasweet).  Sugar and Splenda are okay  - Vegetables:  legumes (lima beans, lentils, snow peas, fava beans, pinto peans, peas, garbanzo beans), sauerkraut, onions, olives, pickles  - Fruit:  avocados, bananas, citrus fruit (orange, lemon, grapefruit), mango  - Other:  Frozen meals, macaroni and cheese 5. Routine exercise 6. Stay adequately hydrated (aim for 64 oz water daily) 7. Keep headache diary 8. Maintain proper stress management 9. Maintain proper sleep hygiene 10. Do not skip meals 11. Consider supplements:  magnesium citrate 400mg  daily, riboflavin 400mg  daily, coenzyme Q10 100mg  three times daily.

## 2020-01-10 NOTE — Telephone Encounter (Signed)
Have her come in for potassium recheck and re-eval  of muscle cramps if not improving. Keep up with water intake.

## 2020-01-11 ENCOUNTER — Ambulatory Visit (INDEPENDENT_AMBULATORY_CARE_PROVIDER_SITE_OTHER): Payer: Medicare Other | Admitting: Family Medicine

## 2020-01-11 ENCOUNTER — Encounter: Payer: Self-pay | Admitting: Family Medicine

## 2020-01-11 ENCOUNTER — Other Ambulatory Visit: Payer: Self-pay

## 2020-01-11 VITALS — BP 140/70 | HR 84 | Temp 98.0°F | Ht 60.5 in | Wt 149.8 lb

## 2020-01-11 DIAGNOSIS — E1165 Type 2 diabetes mellitus with hyperglycemia: Secondary | ICD-10-CM

## 2020-01-11 DIAGNOSIS — R29898 Other symptoms and signs involving the musculoskeletal system: Secondary | ICD-10-CM

## 2020-01-11 DIAGNOSIS — E119 Type 2 diabetes mellitus without complications: Secondary | ICD-10-CM | POA: Diagnosis not present

## 2020-01-11 DIAGNOSIS — K861 Other chronic pancreatitis: Secondary | ICD-10-CM

## 2020-01-11 DIAGNOSIS — E785 Hyperlipidemia, unspecified: Secondary | ICD-10-CM

## 2020-01-11 DIAGNOSIS — E1169 Type 2 diabetes mellitus with other specified complication: Secondary | ICD-10-CM | POA: Diagnosis not present

## 2020-01-11 DIAGNOSIS — I10 Essential (primary) hypertension: Secondary | ICD-10-CM

## 2020-01-11 DIAGNOSIS — I152 Hypertension secondary to endocrine disorders: Secondary | ICD-10-CM

## 2020-01-11 DIAGNOSIS — E1159 Type 2 diabetes mellitus with other circulatory complications: Secondary | ICD-10-CM | POA: Diagnosis not present

## 2020-01-11 MED ORDER — LANTUS SOLOSTAR 100 UNIT/ML ~~LOC~~ SOPN: 55.0000 [IU] | PEN_INJECTOR | Freq: Every day | SUBCUTANEOUS | 11 refills | Status: DC

## 2020-01-11 NOTE — Assessment & Plan Note (Addendum)
Improving on statin and welchol. Also lower given abnormal eating and weight loss due to autoimmune pancreatitis.

## 2020-01-11 NOTE — Assessment & Plan Note (Signed)
Well controlled. Continue current medication.  

## 2020-01-11 NOTE — Progress Notes (Signed)
Chief Complaint  Patient presents with  . Diabetes    History of Present Illness: HPI    69 year old female presents for 3 month follow up DM.   Decrease in budesonide  ( 6/5/2021did not work... symptoms of autoimmune pancreatitis worsened since 10/2019. Poor appetite, nausea. Returned to previous dose in last week... She is staring to feels somewhat better.. nausea is improving.  Next week she has biopsy of mass ( seen on CT) of pancreatitis. Followed by Dr. Earlean Shawl.  Diabetes:  Poor control given fluctuating diet and back on higher dose of budesonide.   She has started titrated back up to 55 Units..  FBS 95-153,, postprandial 59-150 Lab Results  Component Value Date   HGBA1C 8.4 (H) 01/08/2020  Using medications without difficulties: Hypoglycemic episodes: Hyperglycemic episodes: Feet problems: no uclers Blood Sugars averaging: eye exam within last year:  Elevated Cholesterol:On max atorvastatin and welchol. Improving! Lab Results  Component Value Date   CHOL 201 (H) 01/08/2020   HDL 50.90 01/08/2020   LDLCALC 125 (H) 01/08/2020   LDLDIRECT 183.0 01/08/2019   TRIG 125.0 01/08/2020   CHOLHDL 4 01/08/2020  Using medications without problems: Muscle aches:  Diet compliance: difficult to wok on low carb Exercise: walking some Other complaints:   Wt Readings from Last 3 Encounters:  01/11/20 149 lb 12 oz (67.9 kg)  01/09/20 150 lb (68 kg)  10/30/19 158 lb (71.7 kg)     Hypertension:    BP Readings from Last 3 Encounters:  01/11/20 140/70  01/09/20 136/75  10/30/19 140/70  Using medication without problems or lightheadedness: none Chest pain with exertion:none Edema:none Short of breath: Improving some with time post DVT. Average home BPs: Other issues:  Leg weakness is improving.  This visit occurred during the SARS-CoV-2 public health emergency.  Safety protocols were in place, including screening questions prior to the visit, additional usage of staff  PPE, and extensive cleaning of exam room while observing appropriate contact time as indicated for disinfecting solutions.   COVID 19 screen:  No recent travel or known exposure to COVID19 The patient denies respiratory symptoms of COVID 19 at this time. The importance of social distancing was discussed today.     Review of Systems  Constitutional: Positive for malaise/fatigue and weight loss. Negative for chills and fever.       Decreased appetite  HENT: Negative for congestion and ear pain.   Eyes: Negative for pain and redness.  Respiratory: Negative for cough and shortness of breath.   Cardiovascular: Negative for chest pain, palpitations and leg swelling.  Gastrointestinal: Positive for nausea. Negative for abdominal pain, blood in stool, constipation, diarrhea and vomiting.  Genitourinary: Negative for dysuria.  Musculoskeletal: Negative for falls and myalgias.  Skin: Negative for rash.  Neurological: Negative for dizziness.  Psychiatric/Behavioral: Negative for depression. The patient is not nervous/anxious.       Past Medical History:  Diagnosis Date  . Anemia   . Arthritis    osteoarthritis B knees, left hip , right elbow  . Clotting disorder ([place])   . Complication of anesthesia    difficult waking  . Diabetes mellitus without complication ([place])    Diet and exercise controlled  . GERD (gastroesophageal reflux disease)   . Headache   . History of kidney stones   . Hyperlipidemia   . Hypertension   . MVP (mitral valve prolapse)    history of  . PONV (postoperative nausea and vomiting)     reports that  she has quit smoking. She has never used smokeless tobacco. She reports that she does not drink alcohol and does not use drugs.   Current Outpatient Medications:  .  ALPRAZolam (XANAX) 0.25 MG tablet, Take 1 tablet (0.25 mg total) by mouth daily as needed for anxiety., Disp: 20 tablet, Rfl: 0 .  apixaban (ELIQUIS) 2.5 MG TABS tablet, Take 1 tablet (2.5 mg total) by  mouth 2 (two) times daily., Disp: 180 tablet, Rfl: 3 .  atorvastatin (LIPITOR) 80 MG tablet, TAKE 1 TABLET BY MOUTH DAILY., Disp: 90 tablet, Rfl: 3 .  Blood Glucose Monitoring Suppl (ONETOUCH VERIO REFLECT) w/Device KIT, 2 (two) times daily. for testing, Disp: , Rfl:  .  budesonide (ENTOCORT EC) 3 MG 24 hr capsule, Take 3 mg by mouth daily. 3 tabs daily, Disp: , Rfl:  .  cholecalciferol (VITAMIN D) 1000 units tablet, Take 1,000 Units by mouth daily., Disp: , Rfl:  .  colesevelam (WELCHOL) 625 MG tablet, TAKE 3 TABLETS BY MOUTH 2 TIMES DAILY WITH A MEAL., Disp: 540 tablet, Rfl: 3 .  gabapentin (NEURONTIN) 100 MG capsule, Take 100 mg by mouth 3 (three) times daily as needed., Disp: , Rfl:  .  gabapentin (NEURONTIN) 300 MG capsule, Take 2 capsules (600 mg total) by mouth at bedtime., Disp: 180 capsule, Rfl: 1 .  insulin glargine (LANTUS SOLOSTAR) 100 UNIT/ML Solostar Pen, Inject 61 Units into the skin daily. (Patient taking differently: Inject 55 Units into the skin daily. ), Disp: 15 mL, Rfl: 0 .  Insulin Pen Needle (ULTICARE SHORT PEN NEEDLES) 31G X 8 MM MISC, USE TO INJECT INSULIN DAILY, Disp: 30 each, Rfl: 0 .  Lancets (ONETOUCH DELICA PLUS [id number]) MISC, USE TO CHECK BLOOD SUGAR TWO TIMES DAILY, Disp: 100 each, Rfl: 12 .  losartan-hydrochlorothiazide (HYZAAR) 50-12.5 MG tablet, TAKE 1 TABLET BY MOUTH DAILY., Disp: 90 tablet, Rfl: 1 .  omeprazole (PRILOSEC) 20 MG capsule, Take 1 capsule (20 mg total) by mouth daily., Disp: 90 capsule, Rfl: 3 .  ONETOUCH VERIO test strip, USE TO CHECK BLOOD SUGAR TWO TIMES A DAY, Disp: 100 strip, Rfl: 12 .  potassium gluconate 595 (99 K) MG TABS tablet, 595 mg daily. , Disp: , Rfl:  .  promethazine (PHENERGAN) 12.5 MG tablet, Take 12.5 mg by mouth every 6 (six) hours as needed for nausea or vomiting., Disp: , Rfl:  .  Ubrogepant (UBRELVY) 100 MG TABS, Take 1 tablet by mouth as needed (May repeat dose after 2 hours if needed.  Maximum 2 tablets in 24 hours)., Disp:  10 tablet, Rfl: 3   Observations/Objective: Blood pressure 140/70, pulse 84, temperature 98 F (36.7 C), temperature source Temporal, height 5' 0.5" (1.537 m), weight 149 lb 12 oz (67.9 kg), SpO2 98 %.  Physical Exam Constitutional:      General: She is not in acute distress.    Appearance: Normal appearance. She is well-developed. She is not ill-appearing or toxic-appearing.  HENT:     Head: Normocephalic.     Right Ear: Hearing, tympanic membrane, ear canal and external ear normal. Tympanic membrane is not erythematous, retracted or bulging.     Left Ear: Hearing, tympanic membrane, ear canal and external ear normal. Tympanic membrane is not erythematous, retracted or bulging.     Nose: No mucosal edema or rhinorrhea.     Right Sinus: No maxillary sinus tenderness or frontal sinus tenderness.     Left Sinus: No maxillary sinus tenderness or frontal sinus tenderness.  Mouth/Throat:     Pharynx: Uvula midline.  Eyes:     General: Lids are normal. Lids are everted, no foreign bodies appreciated.     Conjunctiva/sclera: Conjunctivae normal.     Pupils: Pupils are equal, round, and reactive to light.  Neck:     Thyroid: No thyroid mass or thyromegaly.     Vascular: No carotid bruit.     Trachea: Trachea normal.  Cardiovascular:     Rate and Rhythm: Normal rate and regular rhythm.     Pulses: Normal pulses.     Heart sounds: Normal heart sounds, S1 normal and S2 normal. No murmur heard.  No friction rub. No gallop.   Pulmonary:     Effort: Pulmonary effort is normal. No tachypnea or respiratory distress.     Breath sounds: Normal breath sounds. No decreased breath sounds, wheezing, rhonchi or rales.  Abdominal:     General: Bowel sounds are normal.     Palpations: Abdomen is soft.     Tenderness: There is no abdominal tenderness.  Musculoskeletal:     Cervical back: Normal range of motion and neck supple.  Skin:    General: Skin is warm and dry.     Findings: No rash.   Neurological:     Mental Status: She is alert.  Psychiatric:        Mood and Affect: Mood is not anxious or depressed.        Speech: Speech normal.        Behavior: Behavior normal. Behavior is cooperative.        Thought Content: Thought content normal.        Judgment: Judgment normal.      Assessment and Plan Hypertension associated with diabetes ([place]) Well controlled. Continue current medication.   Hyperlipidemia associated with type 2 diabetes mellitus ([place]) Improving on statin and welchol. Also lower given abnormal eating and weight loss due to autoimmune pancreatitis.  Leg weakness, bilateral IMproving with home PT and exercise.  Leg cramps continued.. can try increase water, K is normal. Can try tonic water or yellow mustard.  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[place]) Flare likely due to lowering of budesonide.  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  Will likely need some amount of longterm steroid.  GI Dr. Earlean Shawl treating.  Type 2 diabetes mellitus with hyperglycemia ([facility]) Improving control after increase to 55 units. Now back on higher dose prednisone. Fluctuating eating and weight loss. Encouraged 3 meals with snacks in between. Reviewed dosing and CBG fluctuations in detail with patient.       Eliezer Lofts, MD

## 2020-01-11 NOTE — Patient Instructions (Addendum)
Eat three meals daily with healthy snacks in between.  Continue 55 Units insulin for now.  Walk as able for increasing strength in lags and improving lung capacity.

## 2020-01-11 NOTE — Assessment & Plan Note (Signed)
IMproving with home PT and exercise.  Leg cramps continued.. can try increase water, K is normal. Can try tonic water or yellow mustard.

## 2020-01-11 NOTE — Assessment & Plan Note (Signed)
Flare likely due to lowering of budesonide.  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  Will likely need some amount of longterm steroid.  GI Dr. Earlean Shawl treating.

## 2020-01-11 NOTE — Assessment & Plan Note (Signed)
Improving control after increase to 55 units. Now back on higher dose prednisone. Fluctuating eating and weight loss. Encouraged 3 meals with snacks in between. Reviewed dosing and CBG fluctuations in detail with patient.

## 2020-01-22 ENCOUNTER — Other Ambulatory Visit: Payer: Self-pay | Admitting: Family Medicine

## 2020-01-22 DIAGNOSIS — Z1231 Encounter for screening mammogram for malignant neoplasm of breast: Secondary | ICD-10-CM

## 2020-01-24 ENCOUNTER — Ambulatory Visit: Payer: Medicare Other | Admitting: Pulmonary Disease

## 2020-02-08 ENCOUNTER — Other Ambulatory Visit: Payer: Self-pay

## 2020-02-08 ENCOUNTER — Ambulatory Visit
Admission: RE | Admit: 2020-02-08 | Discharge: 2020-02-08 | Disposition: A | Discharge status: Home / Self Care | Payer: Medicare Other | Source: Ambulatory Visit

## 2020-02-08 DIAGNOSIS — Z1231 Encounter for screening mammogram for malignant neoplasm of breast: Secondary | ICD-10-CM

## 2020-02-13 NOTE — Progress Notes (Signed)
Fax received from [facility]:  02/12/20 Infusion given Pt tolerated Well. Next Infusion 05/06/20 at 10 am

## 2020-03-04 ENCOUNTER — Other Ambulatory Visit: Payer: Self-pay | Admitting: Family Medicine

## 2020-03-27 ENCOUNTER — Other Ambulatory Visit: Payer: Self-pay | Admitting: Neurology

## 2020-04-06 ENCOUNTER — Telehealth: Payer: Self-pay | Admitting: Family Medicine

## 2020-04-06 DIAGNOSIS — E119 Type 2 diabetes mellitus without complications: Secondary | ICD-10-CM

## 2020-04-06 NOTE — Telephone Encounter (Signed)
-----   Message from Cloyd Stagers, RT sent at 03/26/2020  4:41 PM EDT ----- Regarding: Lab Orders for Tuesday 11.2.2021 Please place lab orders for Tuesday 11.2.2021, office visit for 3 mo f/u on Friday 11.5.2021 Thank you, Dyke Maes RT(R)

## 2020-04-07 ENCOUNTER — Encounter: Payer: Self-pay | Admitting: Intensive Care

## 2020-04-07 ENCOUNTER — Emergency Department
Admission: EM | Admit: 2020-04-07 | Discharge: 2020-04-07 | Disposition: A | Discharge status: Home / Self Care | Payer: Medicare Other | Attending: Student in an Organized Health Care Education/Training Program | Admitting: Student in an Organized Health Care Education/Training Program

## 2020-04-07 ENCOUNTER — Other Ambulatory Visit: Payer: Self-pay

## 2020-04-07 ENCOUNTER — Emergency Department: Payer: Medicare Other

## 2020-04-07 DIAGNOSIS — I10 Essential (primary) hypertension: Secondary | ICD-10-CM | POA: Diagnosis not present

## 2020-04-07 DIAGNOSIS — Z79899 Other long term (current) drug therapy: Secondary | ICD-10-CM | POA: Insufficient documentation

## 2020-04-07 DIAGNOSIS — E1165 Type 2 diabetes mellitus with hyperglycemia: Secondary | ICD-10-CM | POA: Diagnosis not present

## 2020-04-07 DIAGNOSIS — Z87891 Personal history of nicotine dependence: Secondary | ICD-10-CM | POA: Diagnosis not present

## 2020-04-07 DIAGNOSIS — Z794 Long term (current) use of insulin: Secondary | ICD-10-CM | POA: Diagnosis not present

## 2020-04-07 DIAGNOSIS — M79661 Pain in right lower leg: Secondary | ICD-10-CM | POA: Insufficient documentation

## 2020-04-07 DIAGNOSIS — Z7901 Long term (current) use of anticoagulants: Secondary | ICD-10-CM | POA: Insufficient documentation

## 2020-04-07 LAB — CBC WITH DIFFERENTIAL/PLATELET
Abs Immature Granulocytes: 0.34 10*3/uL — ABNORMAL HIGH (ref 0.00–0.07)
Basophils Absolute: 0.1 10*3/uL (ref 0.0–0.1)
Basophils Relative: 1 %
Eosinophils Absolute: 0.1 10*3/uL (ref 0.0–0.5)
Eosinophils Relative: 1 %
HCT: 41 % (ref 36.0–46.0)
Hemoglobin: 13.3 g/dL (ref 12.0–15.0)
Immature Granulocytes: 2 %
Lymphocytes Relative: 15 %
Lymphs Abs: 2.3 10*3/uL (ref 0.7–4.0)
MCH: 27.9 pg (ref 26.0–34.0)
MCHC: 32.4 g/dL (ref 30.0–36.0)
MCV: 86.1 fL (ref 80.0–100.0)
Monocytes Absolute: 1 10*3/uL (ref 0.1–1.0)
Monocytes Relative: 7 %
Neutro Abs: 11.8 10*3/uL — ABNORMAL HIGH (ref 1.7–7.7)
Neutrophils Relative %: 74 %
Platelets: 359 10*3/uL (ref 150–400)
RBC: 4.76 MIL/uL (ref 3.87–5.11)
RDW: 18.4 % — ABNORMAL HIGH (ref 11.5–15.5)
WBC: 15.7 10*3/uL — ABNORMAL HIGH (ref 4.0–10.5)
nRBC: 0 % (ref 0.0–0.2)

## 2020-04-07 LAB — COMPREHENSIVE METABOLIC PANEL
ALT: 29 U/L (ref 0–44)
AST: 22 U/L (ref 15–41)
Albumin: 3.4 g/dL — ABNORMAL LOW (ref 3.5–5.0)
Alkaline Phosphatase: 73 U/L (ref 38–126)
Anion gap: 15 (ref 5–15)
BUN: 19 mg/dL (ref 8–23)
CO2: 24 mmol/L (ref 22–32)
Calcium: 8.9 mg/dL (ref 8.9–10.3)
Chloride: 99 mmol/L (ref 98–111)
Creatinine, Ser: 1 mg/dL (ref 0.44–1.00)
GFR, Estimated: >60 mL/min (ref >60)
Glucose, Bld: 171 mg/dL — ABNORMAL HIGH (ref 70–99)
Potassium: 3.5 mmol/L (ref 3.5–5.1)
Sodium: 138 mmol/L (ref 135–145)
Total Bilirubin: 0.7 mg/dL (ref 0.3–1.2)
Total Protein: 7.3 g/dL (ref 6.5–8.1)

## 2020-04-07 NOTE — ED Provider Notes (Signed)
Ultrasound is negative.  Patient is relieved.  She says she was just worried because the leg felt the same and the symptoms were the same as when she had a DVT.  Course now she is taking her Eliquis.  She has a follow-up appointment with her doctor on Friday.  I encouraged her to keep this.  She will return for any further problems.   Nena Polio, MD 04/07/20 [phone]

## 2020-04-07 NOTE — ED Triage Notes (Addendum)
Patient presents with right calf swelling/pain for a few days. HX blood clots in September 2020. Blue top also sent to lab

## 2020-04-07 NOTE — ED Provider Notes (Signed)
[facility] Emergency Department Provider Note    First MD Initiated Contact with Patient 04/07/20 1359     (approximate)  I have reviewed the triage vital signs and the nursing notes.   HISTORY  Chief Complaint Leg Swelling    HPI Debra Leach is a 69 y.o. female with the below listed past medical history presents to the ER for evaluation of achy right calf pain as well as some swelling.  She is on Eliquis.  Has a history of PE DVT.  Denies any fevers.  Also on chronic steroids for autoimmune disorder.  Denies any fevers or chills.  No worsening shortness of breath.   No rashes.   Past Medical History:  Diagnosis Date  . Anemia   . Arthritis    osteoarthritis B knees, left hip , right elbow  . Clotting disorder ([place])   . Complication of anesthesia    difficult waking  . Diabetes mellitus without complication ([place])    Diet and exercise controlled  . GERD (gastroesophageal reflux disease)   . Headache   . History of kidney stones   . Hyperlipidemia   . Hypertension   . MVP (mitral valve prolapse)    history of  . PONV (postoperative nausea and vomiting)    Family History  Problem Relation Age of Onset  . Cancer Mother        bone  . Hypertension Father   . Mitral valve prolapse Father   . Asthma Brother   . Arthritis Brother   . Nephrolithiasis Brother   . Nephrolithiasis Brother   . Asthma Brother   . Arthritis Brother   . Aortic aneurysm Brother        ascending aortic aneuysm  . Breast cancer Maternal Aunt   . Breast cancer Maternal Aunt    Past Surgical History:  Procedure Laterality Date  . arm surgery    . BREAST BIOPSY Left   . BREAST SURGERY  2000   breast biopsy (benign)  . LITHOTRIPSY  08-12-2008   stent placed bilaterally  . PARTIAL HYSTERECTOMY     Both ovaries remain, vaginal, for mennorhagia  . TONSILLECTOMY    . TUBAL LIGATION  1980   Patient Active Problem List   Diagnosis Date Noted  . Leg  weakness, bilateral 10/30/2019  . Situational anxiety 10/12/2019  . AK (actinic keratosis) 09/26/2019  . Dyspnea on minimal exertion 07/13/2019  . Stuttering 07/13/2019  . Hypertension associated with diabetes ([place]) 07/06/2019  . Essential (hemorrhagic) thrombocythemia ([place]) 07/06/2019  . Multiple thyroid nodules 03/27/2019  . DVT, lower extremity, proximal, acute, right ([place]) 03/13/2019  . 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[place]) 03/01/2019  . Leukocytosis 03/01/2019  . Breast nodule 03/01/2019  . Abnormal renal finding 06/29/2018  . 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 Facial rash 03/01/2017  . Eustachian tube disorder, bilateral 12/16/2016  . Esophageal dysphagia 11/22/2016  . Chronic low back pain 10/07/2016  . Vision changes 08/12/2016  . Aortic atherosclerosis ([place]) 06/11/2016  . Supraclavicular fossa fullness 06/08/2016  . Family history of aortic aneurysm 08/20/2014  . Vitamin D deficiency 08/20/2014  . Type 2 diabetes mellitus with hyperglycemia ([place]) 01/08/2011  . Osteopenia 11/06/2009  . Hyperlipidemia associated with type 2 diabetes mellitus ([place]) 07/05/2008  . INSOMNIA, CHRONIC 07/05/2008  . Migraine with aura 07/05/2008  . GERD 07/05/2008  . OSTEOARTHRITIS 07/05/2008  . MITRAL VALVE PROLAPSE, HX OF 07/05/2008  . NEPHROLITHIASIS, HX OF 07/05/2008      Prior  to Admission medications   Medication Sig Start Date End Date Taking? Authorizing Provider  ALPRAZolam (XANAX) 0.25 MG tablet Take 1 tablet (0.25 mg total) by mouth daily as needed for anxiety. 10/12/19   Bedsole, Amy E, MD  apixaban (ELIQUIS) 2.5 MG TABS tablet Take 1 tablet (2.5 mg total) by mouth 2 (two) times daily. 09/14/19   Copland, Frederico Hamman, MD  atorvastatin (LIPITOR) 80 MG tablet TAKE 1 TABLET BY MOUTH DAILY. 03/04/20   Bedsole, Amy E, MD  Blood Glucose Monitoring Suppl (ONETOUCH VERIO REFLECT) w/Device KIT 2 (two) times daily. for testing 10/25/19   [provider]  budesonide (ENTOCORT EC)  3 MG 24 hr capsule Take 3 mg by mouth daily. 3 tabs daily 11/10/19   [provider]  cholecalciferol (VITAMIN D) 1000 units tablet Take 1,000 Units by mouth daily.    [provider]  colesevelam (WELCHOL) 625 MG tablet TAKE 3 TABLETS BY MOUTH 2 TIMES DAILY WITH A MEAL. 03/04/20   Bedsole, Amy E, MD  gabapentin (NEURONTIN) 100 MG capsule Take 100 mg by mouth 3 (three) times daily as needed.    [provider]  gabapentin (NEURONTIN) 300 MG capsule Take 2 capsules (600 mg total) by mouth at bedtime. 09/17/19   Tomi Likens, Adam R, DO  insulin glargine (LANTUS SOLOSTAR) 100 UNIT/ML Solostar Pen Inject 55 Units into the skin daily. 01/11/20   Bedsole, Amy E, MD  Insulin Pen Needle (ULTICARE SHORT PEN NEEDLES) 31G X 8 MM MISC USE TO INJECT INSULIN DAILY 10/04/19   Bedsole, Amy E, MD  Lancets (ONETOUCH DELICA PLUS [id number]) MISC USE TO CHECK BLOOD SUGAR TWO TIMES DAILY 02/09/19   Bedsole, Amy E, MD  losartan-hydrochlorothiazide (HYZAAR) 50-12.5 MG tablet TAKE 1 TABLET BY MOUTH DAILY. 10/25/19   Bedsole, Amy E, MD  omeprazole (PRILOSEC) 20 MG capsule Take 1 capsule (20 mg total) by mouth daily. 11/26/19   [name], MD  ONETOUCH VERIO test strip USE TO CHECK BLOOD SUGAR TWO TIMES A DAY 02/09/19   Bedsole, Amy E, MD  potassium gluconate 595 (99 K) MG TABS tablet 595 mg daily.  07/09/19   [provider]  promethazine (PHENERGAN) 12.5 MG tablet Take 12.5 mg by mouth every 6 (six) hours as needed for nausea or vomiting.    [provider]  UBRELVY 100 MG TABS TAKE 1 TABLET BY MOUTH AS NEEDED (MAY REPEAT DOSE AFTER 2 HOURS IF NEEDED. MAXIMUM 2 TABLETS IN 24 HOURS). 03/27/20   Pieter Partridge, DO    Allergies Atenolol, Codeine, Penicillins, Pentazocine lactate, Propranolol, Crestor [rosuvastatin], and Sudafed [pseudoephedrine hcl]    Social History Social History   Tobacco Use  . Smoking status: Former Smoker    Types: Cigarettes  . Smokeless tobacco: Never Used  Vaping  Use  . Vaping Use: Never used  Substance Use Topics  . Alcohol use: No  . Drug use: No    Review of Systems Patient denies headaches, rhinorrhea, blurry vision, numbness, shortness of breath, chest pain, edema, cough, abdominal pain, nausea, vomiting, diarrhea, dysuria, fevers, rashes or hallucinations unless otherwise stated above in HPI. ____________________________________________   PHYSICAL EXAM:  VITAL SIGNS: Vitals:   04/07/20 0946  BP: (!) 166/93  Pulse: (!) 115  Resp: 16  Temp: 97.6 F (36.4 C)  SpO2: 97%    Constitutional: Alert and oriented.  Eyes: Conjunctivae are normal.  Head: Atraumatic. Nose: No congestion/rhinnorhea. Mouth/Throat: Mucous membranes are moist.   Neck: No stridor. Painless ROM.  Cardiovascular:  Normal rate, regular rhythm. Grossly normal heart sounds.  Good peripheral circulation. Respiratory: Normal respiratory effort.  No retractions. Lungs CTAB. Gastrointestinal: Soft and nontender. No distention. No abdominal bruits. No CVA tenderness. Genitourinary:  Musculoskeletal: No lower extremity tenderness trace RLE edema.  Brisk cap refill.  2+ PT and DP.  No joint effusions. Neurologic:  Normal speech and language. No gross focal neurologic deficits are appreciated. No facial droop Skin:  Skin is warm, dry and intact. No rash noted. Psychiatric: Mood and affect are normal. Speech and behavior are normal.  ____________________________________________   LABS (all labs ordered are listed, but only abnormal results are displayed)  Results for orders placed or performed during the hospital encounter of 04/07/20 (from the past 24 hour(s))  CBC with Differential     Status: Abnormal   Collection Time: 04/07/20  9:55 AM  Result Value Ref Range   WBC 15.7 (H) 4.0 - 10.5 K/uL   RBC 4.76 3.87 - 5.11 MIL/uL   Hemoglobin 13.3 12.0 - 15.0 g/dL   HCT 41.0 36 - 46 %   MCV 86.1 80.0 - 100.0 fL   MCH 27.9 26.0 - 34.0 pg   MCHC 32.4 30.0 - 36.0 g/dL    RDW 18.4 (H) 11.5 - 15.5 %   Platelets 359 150 - 400 K/uL   nRBC 0.0 0.0 - 0.2 %   Neutrophils Relative % 74 %   Neutro Abs 11.8 (H) 1.7 - 7.7 K/uL   Lymphocytes Relative 15 %   Lymphs Abs 2.3 0.7 - 4.0 K/uL   Monocytes Relative 7 %   Monocytes Absolute 1.0 0.1 - 1.0 K/uL   Eosinophils Relative 1 %   Eosinophils Absolute 0.1 0.0 - 0.5 K/uL   Basophils Relative 1 %   Basophils Absolute 0.1 0.0 - 0.1 K/uL   Immature Granulocytes 2 %   Abs Immature Granulocytes 0.34 (H) 0.00 - 0.07 K/uL  Comprehensive metabolic panel     Status: Abnormal   Collection Time: 04/07/20  9:55 AM  Result Value Ref Range   Sodium 138 135 - 145 mmol/L   Potassium 3.5 3.5 - 5.1 mmol/L   Chloride 99 98 - 111 mmol/L   CO2 24 22 - 32 mmol/L   Glucose, Bld 171 (H) 70 - 99 mg/dL   BUN 19 8 - 23 mg/dL   Creatinine, Ser 1.00 0.44 - 1.00 mg/dL   Calcium 8.9 8.9 - 10.3 mg/dL   Total Protein 7.3 6.5 - 8.1 g/dL   Albumin 3.4 (L) 3.5 - 5.0 g/dL   AST 22 15 - 41 U/L   ALT 29 0 - 44 U/L   Alkaline Phosphatase 73 38 - 126 U/L   Total Bilirubin 0.7 0.3 - 1.2 mg/dL   GFR, Estimated >60 >60 mL/min   Anion gap 15 5 - 15   ____________________________________________ ____________________________________________  RADIOLOGY   ____________________________________________   PROCEDURES  Procedure(s) performed:  Procedures    Critical Care performed: no ____________________________________________   INITIAL IMPRESSION / ASSESSMENT AND PLAN / ED COURSE  Pertinent labs & imaging results that were available during my care of the patient were reviewed by me and considered in my medical decision making (see chart for details).   DDX: DVT, venous stasis, edema, claudication, cellulitis  Beuna Bolding is a 69 y.o. who presents to the ED with presentation as described above.  Patient well-appearing no acute distress.  Her only symptoms were of right calf discomfort.  She is been compliant with her  Eliquis.   Does have mild leukocytosis but is on chronic steroids.  No fever.  She has good perfusion.  Not consistent with claudication.  No complaints of any worsening shortness of breath or chest pain.  Will order ultrasound to evaluate for any evidence of recurrent or occlusive DVT.  Patient be signed out to oncoming physician pending ultrasound.     The patient was evaluated in Emergency Department today for the symptoms described in the history of present illness. He/she was evaluated in the context of the global COVID-19 pandemic, which necessitated consideration that the patient might be at risk for infection with the SARS-CoV-2 virus that causes COVID-19. Institutional protocols and algorithms that pertain to the evaluation of patients at risk for COVID-19 are in a state of rapid change based on information released by regulatory bodies including the CDC and federal and state organizations. These policies and algorithms were followed during the patient's care in the ED.  As part of my medical decision making, I reviewed the following data within the [place] notes reviewed and incorporated, Labs reviewed, notes from prior ED visits and [place] Controlled Substance Database   ____________________________________________   FINAL CLINICAL IMPRESSION(S) / ED DIAGNOSES  Final diagnoses:  Right calf pain      NEW MEDICATIONS STARTED DURING THIS VISIT:  New Prescriptions   No medications on file     Note:  This document was prepared using Dragon voice recognition software and may include unintentional dictation errors.    Merlyn Lot, MD 04/07/20 1505

## 2020-04-07 NOTE — Discharge Instructions (Addendum)
Please return for increasing pain or swelling.  Please follow-up with your doctor on Friday as planned.  The ultrasound today was negative.  It is unlikely that you would get a blood clot if you are taking the Eliquis however it there is still a remote possibility that this could occur.  If you are still having symptoms in another week your doctor may decide to reultrasound you just to make sure.

## 2020-04-08 ENCOUNTER — Other Ambulatory Visit (INDEPENDENT_AMBULATORY_CARE_PROVIDER_SITE_OTHER): Payer: Medicare Other

## 2020-04-08 DIAGNOSIS — E119 Type 2 diabetes mellitus without complications: Secondary | ICD-10-CM

## 2020-04-08 LAB — COMPREHENSIVE METABOLIC PANEL
ALT: 24 U/L (ref 0–35)
AST: 15 U/L (ref 0–37)
Albumin: 3.6 g/dL (ref 3.5–5.2)
Alkaline Phosphatase: 71 U/L (ref 39–117)
BUN: 18 mg/dL (ref 6–23)
CO2: 32 mEq/L (ref 19–32)
Calcium: 9.1 mg/dL (ref 8.4–10.5)
Chloride: 102 mEq/L (ref 96–112)
Creatinine, Ser: 1.02 mg/dL (ref 0.40–1.20)
GFR: 56.36 mL/min — ABNORMAL LOW (ref >60.00)
Glucose, Bld: 79 mg/dL (ref 70–99)
Potassium: 3.4 mEq/L — ABNORMAL LOW (ref 3.5–5.1)
Sodium: 142 mEq/L (ref 135–145)
Total Bilirubin: 0.5 mg/dL (ref 0.2–1.2)
Total Protein: 6.8 g/dL (ref 6.0–8.3)

## 2020-04-08 LAB — LIPID PANEL
Cholesterol: 271 mg/dL — ABNORMAL HIGH (ref 0–200)
HDL: 64.1 mg/dL (ref >39.00)
LDL Cholesterol: 167 mg/dL — ABNORMAL HIGH (ref 0–99)
NonHDL: 206.6
Total CHOL/HDL Ratio: 4
Triglycerides: 196 mg/dL — ABNORMAL HIGH (ref 0.0–149.0)
VLDL: 39.2 mg/dL (ref 0.0–40.0)

## 2020-04-08 LAB — HEMOGLOBIN A1C: Hgb A1c MFr Bld: 9.8 % — ABNORMAL HIGH (ref 4.6–6.5)

## 2020-04-08 NOTE — Progress Notes (Signed)
No critical labs need to be addressed urgently. We will discuss labs in detail at upcoming office visit.   

## 2020-04-11 ENCOUNTER — Encounter: Payer: Self-pay | Admitting: Family Medicine

## 2020-04-11 ENCOUNTER — Other Ambulatory Visit: Payer: Self-pay

## 2020-04-11 ENCOUNTER — Ambulatory Visit (INDEPENDENT_AMBULATORY_CARE_PROVIDER_SITE_OTHER): Payer: Medicare Other | Admitting: Family Medicine

## 2020-04-11 VITALS — BP 148/88 | HR 113 | Temp 97.8°F | Ht 65.0 in | Wt 158.8 lb

## 2020-04-11 DIAGNOSIS — M79604 Pain in right leg: Secondary | ICD-10-CM | POA: Insufficient documentation

## 2020-04-11 DIAGNOSIS — E041 Nontoxic single thyroid nodule: Secondary | ICD-10-CM | POA: Insufficient documentation

## 2020-04-11 DIAGNOSIS — K861 Other chronic pancreatitis: Secondary | ICD-10-CM

## 2020-04-11 DIAGNOSIS — E1165 Type 2 diabetes mellitus with hyperglycemia: Secondary | ICD-10-CM | POA: Diagnosis not present

## 2020-04-11 MED ORDER — METFORMIN HCL ER 500 MG PO TB24: 500.0000 mg | ORAL_TABLET | Freq: Every day | ORAL | 11 refills | Status: DC

## 2020-04-11 NOTE — Patient Instructions (Addendum)
Plan [place] of thyroid in next few month. INcrease potasssium to 2 tabs daily for 3 days.  Increase water intake.  Continue current dose on Lantus.  Start low dose  metfomrin

## 2020-04-11 NOTE — Progress Notes (Signed)
Chief Complaint  Patient presents with  . Diabetes  . Thyroid Problem    pt needs annual thyroid US ordered    History of Present Illness: HPI   69 year old female presents for 3 month follow up DM.  She has noted > 2 week of pain in right calf Swelling in right calf, not really foot... pain was 5/10 on pain scale  Hx of DVT in right lower leg.  On Eliquis. Went to ER on Nov 1.. neg [place] of right right lower leg.  Pain is still present but comes a goes... 2-3/10... improving  Not really like a cramp. Notes more when sitting  No further swelling, or minimal. Stable SOB... maybe some better.  Leg weakness improving.  Diabetes:  Some worsened diet off and on in last 3 months.  Tried to go off budesonide.. was not able to come off... had to restart budesonide at higher dose to stop flare.  Also had steroid injection in low back in 01/2020. On lantus 60 units... compliant. Using medications without difficulties: none Hypoglycemic episodes: none Hyperglycemic episodes: occ  Feet problems: no ulcers. Blood Sugars averaging: fbs in last week 112-171 eye exam within last year:yes Lab Results  Component Value Date   HGBA1C 9.8 (H) 04/08/2020    History of multiple thyroid nodules... 03/2019 [place]:  .3 cm right mid thyroid cystic TR 1 nodule appears to correlate with the CT finding. This would not meet criteria for any biopsy or follow-up.  1.1 cm right mid thyroid TR 4 nodule meets criteria follow-up in 1 year.  Nonspecific thyroid heterogeneity and additional subcentimeter cystic nodules noted.  Due for re-eval [place].    This visit occurred during the SARS-CoV-2 public health emergency.  Safety protocols were in place, including screening questions prior to the visit, additional usage of staff PPE, and extensive cleaning of exam room while observing appropriate contact time as indicated for disinfecting solutions.   COVID 19 screen:  No recent travel or known exposure to  COVID19 The patient denies respiratory symptoms of COVID 19 at this time. The importance of social distancing was discussed today.     Review of Systems  Constitutional: Negative for chills and fever.  HENT: Negative for congestion and ear pain.   Eyes: Negative for pain and redness.  Respiratory: Negative for cough and shortness of breath.   Cardiovascular: Negative for chest pain, palpitations and leg swelling.  Gastrointestinal: Negative for abdominal pain, blood in stool, constipation, diarrhea, nausea and vomiting.  Genitourinary: Negative for dysuria.  Musculoskeletal: Negative for falls and myalgias.  Skin: Negative for rash.  Neurological: Negative for dizziness.  Psychiatric/Behavioral: Negative for depression. The patient is not nervous/anxious.       Past Medical History:  Diagnosis Date  . Anemia   . Arthritis    osteoarthritis B knees, left hip , right elbow  . Clotting disorder ([place])   . Complication of anesthesia    difficult waking  . Diabetes mellitus without complication ([place])    Diet and exercise controlled  . GERD (gastroesophageal reflux disease)   . Headache   . History of kidney stones   . Hyperlipidemia   . Hypertension   . MVP (mitral valve prolapse)    history of  . PONV (postoperative nausea and vomiting)     reports that she has quit smoking. Her smoking use included cigarettes. She has never used smokeless tobacco. She reports that she does not drink alcohol and does not use drugs.  Current Outpatient Medications:  .  ALPRAZolam (XANAX) 0.25 MG tablet, Take 1 tablet (0.25 mg total) by mouth daily as needed for anxiety., Disp: 20 tablet, Rfl: 0 .  apixaban (ELIQUIS) 2.5 MG TABS tablet, Take 1 tablet (2.5 mg total) by mouth 2 (two) times daily., Disp: 180 tablet, Rfl: 3 .  atorvastatin (LIPITOR) 80 MG tablet, TAKE 1 TABLET BY MOUTH DAILY., Disp: 90 tablet, Rfl: 3 .  Blood Glucose Monitoring Suppl (ONETOUCH VERIO REFLECT) w/Device KIT, 2 (two)  times daily. for testing, Disp: , Rfl:  .  cholecalciferol (VITAMIN D) 1000 units tablet, Take 1,000 Units by mouth daily., Disp: , Rfl:  .  colesevelam (WELCHOL) 625 MG tablet, TAKE 3 TABLETS BY MOUTH 2 TIMES DAILY WITH A MEAL., Disp: 540 tablet, Rfl: 3 .  gabapentin (NEURONTIN) 100 MG capsule, Take 100 mg by mouth 3 (three) times daily as needed., Disp: , Rfl:  .  gabapentin (NEURONTIN) 300 MG capsule, Take 2 capsules (600 mg total) by mouth at bedtime., Disp: 180 capsule, Rfl: 1 .  Insulin Pen Needle (ULTICARE SHORT PEN NEEDLES) 31G X 8 MM MISC, USE TO INJECT INSULIN DAILY, Disp: 30 each, Rfl: 0 .  Lancets (ONETOUCH DELICA PLUS [id number]) MISC, USE TO CHECK BLOOD SUGAR TWO TIMES DAILY, Disp: 100 each, Rfl: 12 .  losartan-hydrochlorothiazide (HYZAAR) 50-12.5 MG tablet, TAKE 1 TABLET BY MOUTH DAILY., Disp: 90 tablet, Rfl: 1 .  omeprazole (PRILOSEC) 20 MG capsule, Take 1 capsule (20 mg total) by mouth daily. (Patient taking differently: Take 20 mg by mouth 2 (two) times daily before a meal. ), Disp: 90 capsule, Rfl: 3 .  ONETOUCH VERIO test strip, USE TO CHECK BLOOD SUGAR TWO TIMES A DAY, Disp: 100 strip, Rfl: 12 .  potassium gluconate 595 (99 K) MG TABS tablet, 595 mg daily. , Disp: , Rfl:  .  promethazine (PHENERGAN) 12.5 MG tablet, Take 12.5 mg by mouth every 6 (six) hours as needed for nausea or vomiting., Disp: , Rfl:  .  UBRELVY 100 MG TABS, TAKE 1 TABLET BY MOUTH AS NEEDED (MAY REPEAT DOSE AFTER 2 HOURS IF NEEDED. MAXIMUM 2 TABLETS IN 24 HOURS)., Disp: 10 tablet, Rfl: 3 .  budesonide (ENTOCORT EC) 3 MG 24 hr capsule, Take 9 mg by mouth daily. 3 tabs daily, Disp: , Rfl:  .  insulin glargine (LANTUS SOLOSTAR) 100 UNIT/ML Solostar Pen, Inject 55 Units into the skin daily. (Patient taking differently: Inject 60 Units into the skin daily. ), Disp: 15 mL, Rfl: 11   Observations/Objective: Blood pressure (!) 148/88, temperature 97.8 F (36.6 C), temperature source Temporal, height '5\' 5"'  (1.651 m),  weight 158 lb 12.8 oz (72 kg).  Physical Exam Constitutional:      General: She is not in acute distress.    Appearance: Normal appearance. She is well-developed. She is not ill-appearing or toxic-appearing.  HENT:     Head: Normocephalic.     Right Ear: Hearing, tympanic membrane, ear canal and external ear normal. Tympanic membrane is not erythematous, retracted or bulging.     Left Ear: Hearing, tympanic membrane, ear canal and external ear normal. Tympanic membrane is not erythematous, retracted or bulging.     Nose: No mucosal edema or rhinorrhea.     Right Sinus: No maxillary sinus tenderness or frontal sinus tenderness.     Left Sinus: No maxillary sinus tenderness or frontal sinus tenderness.     Mouth/Throat:     Pharynx: Uvula midline.  Eyes:  General: Lids are normal. Lids are everted, no foreign bodies appreciated.     Conjunctiva/sclera: Conjunctivae normal.     Pupils: Pupils are equal, round, and reactive to light.  Neck:     Thyroid: No thyroid mass or thyromegaly.     Vascular: No carotid bruit.     Trachea: Trachea normal.  Cardiovascular:     Rate and Rhythm: Normal rate and regular rhythm.     Pulses: Normal pulses.     Heart sounds: Normal heart sounds, S1 normal and S2 normal. No murmur heard.  No friction rub. No gallop.      Comments: Negative Homan's sign bialterally Pulmonary:     Effort: Pulmonary effort is normal. No tachypnea or respiratory distress.     Breath sounds: Normal breath sounds. No decreased breath sounds, wheezing, rhonchi or rales.  Abdominal:     General: Bowel sounds are normal.     Palpations: Abdomen is soft.     Tenderness: There is no abdominal tenderness.  Musculoskeletal:     Cervical back: Normal range of motion and neck supple.     Right lower leg: No edema.     Left lower leg: No edema.  Skin:    General: Skin is warm and dry.     Findings: No rash.     Comments:  multiple bruises.  Neurological:     Mental Status:  She is alert.  Psychiatric:        Mood and Affect: Mood is not anxious or depressed.        Speech: Speech normal.        Behavior: Behavior normal. Behavior is cooperative.        Thought Content: Thought content normal.        Judgment: Judgment normal.      Diabetic foot exam: Normal inspection No skin breakdown No calluses  Normal DP pulses Decrease sensation to light touch and monofilament Nails normal   Assessment and Plan   Type 2 diabetes mellitus with hyperglycemia ([place])  Now worsened control.  Has not tried other meds other than lantus due to desire for simplicity.  Will try addition of low dose metformin as it is an insulin sensitizer.  Call with measurements in 1-2 weeks or if not tolerating.  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  Thyroid nodule Due for re-eval 1 year [place].  Right leg pain  Neg [place].. no DVT.  Pain is intermittent, no swelling , neg Homan's sign and improving.  No further eval needed.  May be due to dehydration, low K... replete K.     Eliezer Lofts, MD

## 2020-04-11 NOTE — Assessment & Plan Note (Signed)
On chronic steroids.

## 2020-04-11 NOTE — Assessment & Plan Note (Signed)
Neg [place].. no DVT.  Pain is intermittent, no swelling , neg Homan's sign and improving.  No further eval needed.  May be due to dehydration, low K... replete K.

## 2020-04-11 NOTE — Assessment & Plan Note (Signed)
Now worsened control.  Has not tried other meds other than lantus due to desire for simplicity.  Will try addition of low dose metformin as it is an insulin sensitizer.  Call with measurements in 1-2 weeks or if not tolerating.

## 2020-04-11 NOTE — Assessment & Plan Note (Signed)
Due for re-eval 1 year [place].

## 2020-04-17 ENCOUNTER — Other Ambulatory Visit: Payer: Self-pay

## 2020-04-17 ENCOUNTER — Ambulatory Visit
Admission: RE | Admit: 2020-04-17 | Discharge: 2020-04-17 | Disposition: A | Discharge status: Home / Self Care | Payer: Medicare Other | Source: Ambulatory Visit | Attending: Family Medicine | Admitting: Family Medicine

## 2020-04-17 DIAGNOSIS — E041 Nontoxic single thyroid nodule: Secondary | ICD-10-CM | POA: Insufficient documentation

## 2020-04-26 ENCOUNTER — Other Ambulatory Visit: Payer: Self-pay | Admitting: Family Medicine

## 2020-04-26 DIAGNOSIS — E119 Type 2 diabetes mellitus without complications: Secondary | ICD-10-CM

## 2020-05-07 ENCOUNTER — Other Ambulatory Visit: Payer: Self-pay | Admitting: Family Medicine

## 2020-05-07 ENCOUNTER — Other Ambulatory Visit: Payer: Self-pay | Admitting: Neurology

## 2020-05-07 DIAGNOSIS — M545 Low back pain, unspecified: Secondary | ICD-10-CM

## 2020-05-07 DIAGNOSIS — G8929 Other chronic pain: Secondary | ICD-10-CM

## 2020-05-07 DIAGNOSIS — E119 Type 2 diabetes mellitus without complications: Secondary | ICD-10-CM

## 2020-05-12 ENCOUNTER — Encounter: Payer: Self-pay | Admitting: Neurology

## 2020-05-12 NOTE — Progress Notes (Signed)
Debra Leach (Key: [id number]) Debra Leach 100MG  tablets   Form [employer] Medicare Part D Electronic Prior Authorization Form (2017 NCPDP) Created 2 days ago Sent to Plan 21 minutes ago Plan Response 18 minutes ago Submit Clinical Questions 12 minutes ago Determination Favorable 10 minutes ago Message from Plan Request Reference Number: [id number]. UBRELVY TAB 100MG  is approved through 06/06/2021. Your patient may now fill this prescription and it will be covered.

## 2020-06-09 ENCOUNTER — Other Ambulatory Visit: Payer: Medicare Other

## 2020-06-12 ENCOUNTER — Ambulatory Visit: Payer: Medicare Other | Admitting: Family Medicine

## 2020-07-05 ENCOUNTER — Telehealth: Payer: Self-pay | Admitting: Family Medicine

## 2020-07-05 DIAGNOSIS — E559 Vitamin D deficiency, unspecified: Secondary | ICD-10-CM

## 2020-07-05 DIAGNOSIS — E1165 Type 2 diabetes mellitus with hyperglycemia: Secondary | ICD-10-CM

## 2020-07-05 DIAGNOSIS — E785 Hyperlipidemia, unspecified: Secondary | ICD-10-CM

## 2020-07-05 DIAGNOSIS — E1169 Type 2 diabetes mellitus with other specified complication: Secondary | ICD-10-CM

## 2020-07-05 NOTE — Telephone Encounter (Signed)
-----   Message from Ellamae Sia sent at 06/24/2020  2:57 PM EST ----- Regarding: Lab orders for Wednesday, 2.2.22 Patient is scheduled for CPX labs, please order future labs, Thanks , Karna Christmas

## 2020-07-09 ENCOUNTER — Other Ambulatory Visit (INDEPENDENT_AMBULATORY_CARE_PROVIDER_SITE_OTHER): Payer: Medicare Other

## 2020-07-09 ENCOUNTER — Telehealth: Payer: Self-pay | Admitting: Radiology

## 2020-07-09 ENCOUNTER — Other Ambulatory Visit: Payer: Self-pay

## 2020-07-09 DIAGNOSIS — E559 Vitamin D deficiency, unspecified: Secondary | ICD-10-CM

## 2020-07-09 DIAGNOSIS — E1165 Type 2 diabetes mellitus with hyperglycemia: Secondary | ICD-10-CM

## 2020-07-09 LAB — COMPREHENSIVE METABOLIC PANEL
ALT: 16 U/L (ref 0–35)
AST: 13 U/L (ref 0–37)
Albumin: 3.5 g/dL (ref 3.5–5.2)
Alkaline Phosphatase: 73 U/L (ref 39–117)
BUN: 14 mg/dL (ref 6–23)
CO2: 34 mEq/L — ABNORMAL HIGH (ref 19–32)
Calcium: 9.3 mg/dL (ref 8.4–10.5)
Chloride: 103 mEq/L (ref 96–112)
Creatinine, Ser: 1.05 mg/dL (ref 0.40–1.20)
GFR: 54.34 mL/min — ABNORMAL LOW (ref >60.00)
Glucose, Bld: 47 mg/dL — CL (ref 70–99)
Potassium: 3.3 mEq/L — ABNORMAL LOW (ref 3.5–5.1)
Sodium: 143 mEq/L (ref 135–145)
Total Bilirubin: 0.3 mg/dL (ref 0.2–1.2)
Total Protein: 6.8 g/dL (ref 6.0–8.3)

## 2020-07-09 LAB — LIPID PANEL
Cholesterol: 201 mg/dL — ABNORMAL HIGH (ref 0–200)
HDL: 53.8 mg/dL (ref >39.00)
LDL Cholesterol: 116 mg/dL — ABNORMAL HIGH (ref 0–99)
NonHDL: 147.68
Total CHOL/HDL Ratio: 4
Triglycerides: 157 mg/dL — ABNORMAL HIGH (ref 0.0–149.0)
VLDL: 31.4 mg/dL (ref 0.0–40.0)

## 2020-07-09 LAB — HEMOGLOBIN A1C: Hgb A1c MFr Bld: 8.1 % — ABNORMAL HIGH (ref 4.6–6.5)

## 2020-07-09 LAB — VITAMIN D 25 HYDROXY (VIT D DEFICIENCY, FRACTURES): VITD: 27.99 ng/mL — ABNORMAL LOW (ref 30.00–100.00)

## 2020-07-09 NOTE — Telephone Encounter (Signed)
Noted  

## 2020-07-09 NOTE — Telephone Encounter (Signed)
Can you call  Her blood sugar was low at her blood draw.  Can you get her to check her BS at home, and if it is low then drink some OJ, apple juice, candy, etc.  I am sure she will know how to do this.

## 2020-07-09 NOTE — Telephone Encounter (Signed)
[facility] called a critical Glucose - 47. Results given to Dr Lorelei Pont and sent to Dr Diona Browner

## 2020-07-09 NOTE — Telephone Encounter (Signed)
Debra Leach notified as instructed by telephone.  She states she had just checked her blood sugar about 15 minutes prior to me calling and it was 131 mg/dl.  She states she had also checked it at 5:15 am this morning and it was 115.  Will discuss further at her appointment with Dr. Diona Browner on 07/15/2020.

## 2020-07-10 NOTE — Progress Notes (Signed)
NEUROLOGY FOLLOW UP OFFICE NOTE  Debra Leach [id number]   Subjective:  Debra Leach is a 70 year old right-handed female with autoimmune pancreatitis, migraines, hyperlipidemia, diabetes, GERD, osteoarthritis, and history of MVP and renal stones who follows up for migraines.  UPDATE: November 2021 to January 2022 Headache-free - 12 days Level 1-3/10 - 32 days, avg duration 3 hours Level 4-6/10 - 43 days, avg duration 3-4 hours Level 7-10/10 - 5 days, avg duration 4-5 hours  Rescue protocol:  For moderate - gabapentin 131m, for severe-Roselyn MeierCurrent NSAIDS/steroid:budesomide  Current analgesics:None Current triptans:None Current ergotamine:None Current anti-emetic:Promethazine 12.5 mg Current muscle relaxants:None Current anti-anxiolytic:None Current sleep aide:None Current Antihypertensive medications: losartan-HCTZ Current Antidepressant medications:None Current Anticonvulsant medications:Gabapentin3072mtwice daily Current anti-CGRP:Vyepti 10068mUbrelvy Current Vitamins/Herbal/Supplements:Riboflavin 400 mg daily, vitamin D Current Antihistamines/Decongestants:None Other therapy:Cefaly, ice pack Other medication:Meclizine  Caffeine:1 cup of coffee daily Alcohol:None Smoker:No Diet:Follows LEAP ImmunoCalm Dietary Program. Hydrates. Exercise:When tolerated (knee problems). She walks and bikes. Depression:No; Anxiety:No Other pain:no Sleep hygiene:Improved  HISTORY: Onset: [age]ars old Location:Varies (unilateral either side, frontal-temporal, back of head) Quality:Varies (stabbing, pounding, throbbing) Initial Intensity:10/10 severe, otherwise 5-7/10 Aura:Occurs off and on over the years and varies in semiology.In early 1970s, black out.In late 1980s, white out.In late 1990s, flashing lights and zigzag lines.Since 2016, scintillating scotoma (occurs 1 to 2 times a month) Prodrome:no Associated  symptoms: Initially vomiting.Sometimes nausea.Photophobia, phonophobia.Dizziness. Initial Duration:All day Initial Frequency:daily Triggers: Change in weather, emotional stress, valsalva maneuver, odors, certain foods Relieving factors: ice, rest Activity:Difficult to function if severe  Past NSAIDS:diclofenac 65m40[place], Mobic 15mg31muprofen, naproxen, toradol  Past analgesics:tramadol (reaction), Midrin, Excedrin Past abortive triptans/ergots:Treximet, Axert, Amerge, Frova, Maxalt, Relpax, Zomig tablet, DHE NS, sumatriptan 100mg;93matriptan 6mg SC19mst anti-emetic: Zofran 4mg Pas18mnxiolytic:Buspirone, clonazepam Past antihypertensive medications:Metoprolol, Norvasc, propranolol ER 120mg, at69mol 100mg (sid1mfects dizziness, myalgias, acid reflux) Past antidepressant medications:Venlafaxine, sertraline, amitriptyline, amoxapine, duloxetine, nortriptyline, imipramine, Luvox, Remeron, Serzone, bupropion, desipramine, doxepin, Vivactil Past anticonvulsant medications:Depakote, Topamax, zonisamide, Lamictal, Keppra, Lyrica, gabapentin 300/300/600 Past anti--CGRP: Aimovig, Emgality Past vitamins/Herbal/Supplements:butterbur, Mg, CoQ10 Other past treatments:Botox, Cefaly Device, methylergonovine, acupuncture, biofeedback, Seroquel, Thorazine, methylsergide maleate  Family history of headache:Maybe her mother's side.  Prior brain MRI normal (date unknown but report is mentioned in prior notes).  Abnormal Movements: In December 2017, we started atenolol for migraine prevention. It worked well, but due to reported side effects of dizziness, acid reflux and joint and muscle pain, she was tapered off of it in early April. Around that time, she developed shaking episodes or tremors associated with her migraines. She followed up with her PCP, Dr. Bedsole, oDiona Browner8 and we had her discontinue the gabapentin. She hasn't had any recurrent tremors, however  she says she has gone up to 4 days at a time without an attack. Since discontinuing the gabapentin, the intensity of her daily headaches have gotten worse.   I reviewed the video of her habitual attacks. In the video, she exhibits flapping of her right hand, arrhythmic and without tremor or choreiform movement. It is not an arrhythmic jerking, consistent with myoclonus. It sometimes involves the left hand as well. She denies stress and anxiety.  PAST MEDICAL HISTORY: Past Medical History:  Diagnosis Date  . Anemia   . Arthritis    osteoarthritis B knees, left hip , right elbow  . Clotting disorder (HCC)   . CBolivarlication of anesthesia    difficult waking  . Diabetes mellitus without complication (HCC)    DiBannockburnand exercise controlled  .  GERD (gastroesophageal reflux disease)   . Headache   . History of kidney stones   . Hyperlipidemia   . Hypertension   . MVP (mitral valve prolapse)    history of  . PONV (postoperative nausea and vomiting)     MEDICATIONS: Current Outpatient Medications on File Prior to Visit  Medication Sig Dispense Refill  . ALPRAZolam (XANAX) 0.25 MG tablet Take 1 tablet (0.25 mg total) by mouth daily as needed for anxiety. 20 tablet 0  . apixaban (ELIQUIS) 2.5 MG TABS tablet Take 1 tablet (2.5 mg total) by mouth 2 (two) times daily. 180 tablet 3  . atorvastatin (LIPITOR) 80 MG tablet TAKE 1 TABLET BY MOUTH DAILY. 90 tablet 3  . Blood Glucose Monitoring Suppl (ONETOUCH VERIO REFLECT) w/Device KIT 2 (two) times daily. for testing    . budesonide (ENTOCORT EC) 3 MG 24 hr capsule Take 9 mg by mouth daily. 3 tabs daily    . cholecalciferol (VITAMIN D) 1000 units tablet Take 1,000 Units by mouth daily.    . colesevelam (WELCHOL) 625 MG tablet TAKE 3 TABLETS BY MOUTH 2 TIMES DAILY WITH A MEAL. 540 tablet 3  . gabapentin (NEURONTIN) 100 MG capsule Take 100 mg by mouth 3 (three) times daily as needed.    . gabapentin (NEURONTIN) 300 MG capsule TAKE 2 CAPSULES BY  MOUTH AT BEDTIME. 180 capsule 0  . insulin glargine (LANTUS SOLOSTAR) 100 UNIT/ML Solostar Pen Inject 55 Units into the skin daily. (Patient taking differently: Inject 60 Units into the skin daily. ) 15 mL 11  . Lancets (ONETOUCH DELICA PLUS [id number]) MISC USE TO CHECK BLOOD SUGAR TWO TIMES DAILY 100 each 12  . losartan-hydrochlorothiazide (HYZAAR) 50-12.5 MG tablet TAKE 1 TABLET BY MOUTH DAILY. 90 tablet 1  . metFORMIN (GLUCOPHAGE XR) 500 MG 24 hr tablet Take 1 tablet (500 mg total) by mouth daily with breakfast. 30 tablet 11  . omeprazole (PRILOSEC) 20 MG capsule Take 1 capsule (20 mg total) by mouth daily. (Patient taking differently: Take 20 mg by mouth 2 (two) times daily before a meal. ) 90 capsule 3  . ONETOUCH VERIO test strip USE TO CHECK BLOOD SUGAR TWO TIMES A DAY 100 strip 12  . potassium gluconate 595 (99 K) MG TABS tablet 595 mg daily.     . promethazine (PHENERGAN) 12.5 MG tablet Take 12.5 mg by mouth every 6 (six) hours as needed for nausea or vomiting.    Marland Kitchen UBRELVY 100 MG TABS TAKE 1 TABLET BY MOUTH AS NEEDED (MAY REPEAT DOSE AFTER 2 HOURS IF NEEDED. MAXIMUM 2 TABLETS IN 24 HOURS). 10 tablet 3  . ULTICARE SHORT PEN NEEDLES 31G X 8 MM MISC USE TO INJECT INSULIN DAILY 100 each 2   No current facility-administered medications on file prior to visit.    ALLERGIES: Allergies  Allergen Reactions  . Atenolol   . Codeine     REACTION: Migraine  . Penicillins Itching    Has patient had a PCN reaction causing immediate rash, facial/tongue/throat swelling, SOB or lightheadedness with hypotension: No Has patient had a PCN reaction causing severe rash involving mucus membranes or skin necrosis: No Has patient had a PCN reaction that required hospitalization: No Has patient had a PCN reaction occurring within the last 10 years: No If all of the above answers are "NO", then may proceed with Cephalosporin use.   Marland Kitchen Pentazocine Lactate     REACTION: Swelling, itching, rash  .  Propranolol Other (See Comments)  .  Crestor [Rosuvastatin] Other (See Comments)  . Sudafed [Pseudoephedrine Hcl] Itching and Anxiety    FAMILY HISTORY: Family History  Problem Relation Age of Onset  . Cancer Mother        bone  . Hypertension Father   . Mitral valve prolapse Father   . Asthma Brother   . Arthritis Brother   . Nephrolithiasis Brother   . Nephrolithiasis Brother   . Asthma Brother   . Arthritis Brother   . Aortic aneurysm Brother        ascending aortic aneuysm  . Breast cancer Maternal Aunt   . Breast cancer Maternal Aunt     SOCIAL HISTORY: Social History   Socioeconomic History  . Marital status: Married    Spouse name: Carloyn Manner  . Number of children: 0  . Years of education: Not on file  . Highest education level: Not on file  Occupational History  . Occupation: retired Energy manager: retired  Tobacco Use  . Smoking status: Former Smoker    Types: Cigarettes  . Smokeless tobacco: Never Used  Vaping Use  . Vaping Use: Never used  Substance and Sexual Activity  . Alcohol use: No  . Drug use: No  . Sexual activity: Not on file  Other Topics Concern  . Not on file  Social History Narrative   Regular exercise--yes, recumbent bike 3-4 days a week      Diet: fruits and veggies, water, eats at home, drinks a lot of milk      Patient is right-handed. She drinks 1-2 cups of coffee a day.      One story home   Social Determinants of Health   Financial Resource Strain: Low Risk   . Difficulty of Paying Living Expenses: Not hard at all  Food Insecurity: Not on file  Transportation Needs: Not on file  Physical Activity: Not on file  Stress: Not on file  Social Connections: Not on file  Intimate Partner Violence: Not on file     Objective:  Blood pressure 120/73, pulse 95, height 5' (1.524 m), weight 159 lb 9.6 oz (72.4 kg), SpO2 95 %. General: No acute distress.  Patient appears well-groomed.      Assessment/Plan:   Chronic  migraine without aura, without status migrainosus, not intractable, overall improved but will try to optimize improvement  1.  Migraine prevention:  Increase Vyepti to 360m every 12 weeks 2.  Migraine rescue:  Ubrelvy 1027m gabapentin for less severe 3.  Limit use of pain relievers to no more than 2 days out of week to prevent risk of rebound or medication-overuse headache. 4.  Keep headache diary 5.  Follow up in 6 months.  AdMetta ClinesDO  CC:  AmEliezer LoftsMD

## 2020-07-11 ENCOUNTER — Encounter: Payer: Self-pay | Admitting: Neurology

## 2020-07-11 ENCOUNTER — Other Ambulatory Visit: Payer: Self-pay

## 2020-07-11 ENCOUNTER — Ambulatory Visit (INDEPENDENT_AMBULATORY_CARE_PROVIDER_SITE_OTHER): Payer: Medicare Other | Admitting: Neurology

## 2020-07-11 VITALS — BP 120/73 | HR 95 | Ht 60.0 in | Wt 159.6 lb

## 2020-07-11 DIAGNOSIS — G43709 Chronic migraine without aura, not intractable, without status migrainosus: Secondary | ICD-10-CM | POA: Diagnosis not present

## 2020-07-11 NOTE — Progress Notes (Signed)
Gwenlyn Found KeyMargarito Courser - PA Case ID: [id number] Need help? Call us at [phone] Outcome Approvedtoday Request Reference Number: [id number]. VYEPTI INJ 100MG /ML is approved through 06/06/2021. Your patient may now fill this prescription and it will be covered. Drug Vyepti 100MG /ML solution Form [employer] Medicare Part D Electronic Prior Authorization Form (2017 [place])

## 2020-07-11 NOTE — Patient Instructions (Signed)
Increase Vyepti to 300mg  Continue Ubrelvy and/or gabapentin as needed. Follow up 6 months.

## 2020-07-15 ENCOUNTER — Encounter: Payer: Self-pay | Admitting: Family Medicine

## 2020-07-15 ENCOUNTER — Other Ambulatory Visit: Payer: Self-pay

## 2020-07-15 ENCOUNTER — Ambulatory Visit (INDEPENDENT_AMBULATORY_CARE_PROVIDER_SITE_OTHER): Payer: Medicare Other | Admitting: Family Medicine

## 2020-07-15 VITALS — BP 120/70 | HR 104 | Temp 97.9°F | Ht 60.5 in | Wt 158.5 lb

## 2020-07-15 DIAGNOSIS — K861 Other chronic pancreatitis: Secondary | ICD-10-CM

## 2020-07-15 DIAGNOSIS — E1169 Type 2 diabetes mellitus with other specified complication: Secondary | ICD-10-CM | POA: Diagnosis not present

## 2020-07-15 DIAGNOSIS — Z0000 Encounter for general adult medical examination without abnormal findings: Secondary | ICD-10-CM

## 2020-07-15 DIAGNOSIS — E785 Hyperlipidemia, unspecified: Secondary | ICD-10-CM

## 2020-07-15 DIAGNOSIS — Z23 Encounter for immunization: Secondary | ICD-10-CM

## 2020-07-15 DIAGNOSIS — E1165 Type 2 diabetes mellitus with hyperglycemia: Secondary | ICD-10-CM | POA: Diagnosis not present

## 2020-07-15 DIAGNOSIS — E876 Hypokalemia: Secondary | ICD-10-CM

## 2020-07-15 DIAGNOSIS — I152 Hypertension secondary to endocrine disorders: Secondary | ICD-10-CM

## 2020-07-15 DIAGNOSIS — I700 Atherosclerosis of aorta: Secondary | ICD-10-CM

## 2020-07-15 DIAGNOSIS — E1159 Type 2 diabetes mellitus with other circulatory complications: Secondary | ICD-10-CM

## 2020-07-15 MED ORDER — POTASSIUM CHLORIDE CRYS ER 20 MEQ PO TBCR: 20.0000 meq | EXTENDED_RELEASE_TABLET | Freq: Two times a day (BID) | ORAL | 0 refills | Status: DC

## 2020-07-15 NOTE — Assessment & Plan Note (Signed)
IMproving control with addition of metformin.. so recnet lows... continue metformin  And decrease insulin to 55 Units daily.  May beed to D/C metformin if causing GI upset.

## 2020-07-15 NOTE — Assessment & Plan Note (Signed)
Improving but likely better due to minimal po intake.  On high dose statin and welchol. Not interested in addition treatment at this time.

## 2020-07-15 NOTE — Assessment & Plan Note (Signed)
ON statin. LDL goal < 70.

## 2020-07-15 NOTE — Progress Notes (Signed)
Patient ID: Debra Leach, female    DOB: [birth year], 70 y.o.   MRN: [id number]  This visit was conducted in person.  BP 120/70   Pulse (!) 104   Temp 97.9 F (36.6 C) (Temporal)   Ht 5' 0.5" (1.537 m)   Wt 158 lb 8 oz (71.9 kg)   SpO2 98%   BMI 30.45 kg/m    CC:  Chief Complaint  Patient presents with  . Medicare Wellness    Subjective:   HPI: Debra Leach is a 70 y.o. female presenting on 07/15/2020 for Medicare Wellness    I have personally reviewed the Medicare Annual Wellness questionnaire and have noted 1. The patient's medical and social history 2. Their use of alcohol, tobacco or illicit drugs 3. Their current medications and supplements 4. The patient's functional ability including ADL's, fall risks, home safety risks and hearing or visual             impairment. 5. Diet and physical activities 6. Evidence for depression or mood disorders 7.         Updated provider list Cognitive evaluation was performed and recorded on pt medicare questionnaire form. The patients weight, height, BMI and visual acuity have been recorded in the chart  I have made referrals, counseling and provided education to the patient based review of the above and I have provided the pt with a written personalized care plan for preventive services.   Documentation of this information was scanned into the electronic record under the media tab.  Advance directives and end of life planning reviewed in detail with patient and documented in EMR. Patient given handout on advance care directives if needed. HCPOA and living will updated if needed.   Hearing Screening   Method: Audiometry   '125Hz'  '250Hz'  '500Hz'  '1000Hz'  '2000Hz'  '3000Hz'  '4000Hz'  '6000Hz'  '8000Hz'   Right ear:   0 0 20  20    Left ear:   25 0 25  25    Vision Screening Comments: Wears Glasses:  Eye Exam with Debra Leach 01/28/2020   Flowsheet Row Clinical Support from 04/06/2019 in [place] at [facility]  Total Score 0     Diabetes:  Started metformin NOV.. had stomach upset.. not sure if from last budesonide decreae.. She has had decrease in A1C from 9.8  Using 60 units  lantus and metfomrin 500 mg daily. Lab Results  Component Value Date   HGBA1C 8.1 (H) 07/09/2020  Using medications without difficulties: Hypoglycemic episodes:  One with labs 55... occurred after mirgaine all night and not eating and gave her self insulin. Hyperglycemic episodes: none Feet problems: no ulcers Blood Sugars averaging: FBS 70-115, 2 hours after meal 88-131 eye exam within last year: yes  Hypertension: good control on ARB/diuretic    BP Readings from Last 3 Encounters:  07/15/20 120/70  07/11/20 120/73  04/11/20 (!) 148/88  Using medication without problems or lightheadedness:  none Chest pain with exertion: none Edema:none Short of breath:  Chronic.. improved Average home BPs: Other issues:  leg weakness improved but she is not doing much activity.   Elevated Cholesterol:   Goal LDL < 70 give aortic atherosclerosis... not at goal on  Max atorvastatin and welchol. Lab Results  Component Value Date   CHOL 201 (H) 07/09/2020   HDL 53.80 07/09/2020   LDLCALC 116 (H) 07/09/2020   LDLDIRECT 183.0 01/08/2019   TRIG 157.0 (H) 07/09/2020   CHOLHDL 4 07/09/2020  Using medications without  problems: Muscle aches:  Diet compliance: see above Exercise: none Other complaints:   Vit D: using 1000 mg daily   Low potassium.. K 3.3  Using potassium supplement  Having cramping.  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 She continues to have nausea... started again in 03/2020 when budesonide was reduced.  She has had decreased appetite.   HAs had blood in stool.Marland Kitchen scheduled for colonoscopy for next week.   Wt Readings from Last 3 Encounters:  07/15/20 158 lb 8 oz (71.9 kg)  07/11/20 159 lb 9.6 oz (72.4 kg)  04/11/20 158 lb 12.8 oz (72 kg)     Multiple thyroid nodules: stable  and small in 2021.. no further eval needed.     Relevant past medical, surgical, family and social history reviewed and updated as indicated. Interim medical history since our last visit reviewed. Allergies and medications reviewed and updated. Outpatient Medications Prior to Visit  Medication Sig Dispense Refill  . apixaban (ELIQUIS) 2.5 MG TABS tablet Take 1 tablet (2.5 mg total) by mouth 2 (two) times daily. 180 tablet 3  . atorvastatin (LIPITOR) 80 MG tablet TAKE 1 TABLET BY MOUTH DAILY. 90 tablet 3  . Blood Glucose Monitoring Suppl (ONETOUCH VERIO REFLECT) w/Device KIT 2 (two) times daily. for testing    . budesonide (ENTOCORT EC) 3 MG 24 hr capsule Take 2 capsules by mouth daily.    . cholecalciferol (VITAMIN D) 1000 units tablet Take 1,000 Units by mouth daily.    . colesevelam (WELCHOL) 625 MG tablet TAKE 3 TABLETS BY MOUTH 2 TIMES DAILY WITH A MEAL. 540 tablet 3  . Eptinezumab-jjmr (VYEPTI) 100 MG/ML injection Inject 100 mg into the vein.    Marland Kitchen gabapentin (NEURONTIN) 100 MG capsule Take 100 mg by mouth 3 (three) times daily as needed.    . gabapentin (NEURONTIN) 300 MG capsule TAKE 2 CAPSULES BY MOUTH AT BEDTIME. 180 capsule 0  . insulin glargine (LANTUS SOLOSTAR) 100 UNIT/ML Solostar Pen Inject 60 Units into the skin daily.    . Lancets (ONETOUCH DELICA PLUS [id number]) MISC USE TO CHECK BLOOD SUGAR TWO TIMES DAILY 100 each 12  . losartan-hydrochlorothiazide (HYZAAR) 50-12.5 MG tablet TAKE 1 TABLET BY MOUTH DAILY. 90 tablet 1  . metFORMIN (GLUCOPHAGE XR) 500 MG 24 hr tablet Take 1 tablet (500 mg total) by mouth daily with breakfast. 30 tablet 11  . omeprazole (PRILOSEC) 20 MG capsule Take 20 mg by mouth 2 (two) times daily before a meal.    . ONETOUCH VERIO test strip USE TO CHECK BLOOD SUGAR TWO TIMES A DAY 100 strip 12  . potassium gluconate 595 (99 K) MG TABS tablet 595 mg daily.     . promethazine (PHENERGAN) 12.5 MG tablet Take 12.5 mg by mouth every 6 (six) hours as needed for  nausea or vomiting.    Marland Kitchen UBRELVY 100 MG TABS TAKE 1 TABLET BY MOUTH AS NEEDED (MAY REPEAT DOSE AFTER 2 HOURS IF NEEDED. MAXIMUM 2 TABLETS IN 24 HOURS). 10 tablet 3  . ULTICARE SHORT PEN NEEDLES 31G X 8 MM MISC USE TO INJECT INSULIN DAILY 100 each 2  . omeprazole (PRILOSEC OTC) 20 MG tablet Take 1 capsule by mouth daily.    . budesonide (ENTOCORT EC) 3 MG 24 hr capsule Take 6 mg by mouth daily.     No facility-administered medications prior to visit.     Per HPI unless specifically indicated in ROS section below Review of Systems  Constitutional: Negative for fatigue  and fever.  HENT: Negative for congestion.   Eyes: Negative for pain.  Respiratory: Negative for cough and shortness of breath.   Cardiovascular: Negative for chest pain, palpitations and leg swelling.  Gastrointestinal: Positive for abdominal pain, blood in stool, nausea and vomiting.  Genitourinary: Negative for dysuria and vaginal bleeding.  Musculoskeletal: Negative for back pain.  Neurological: Negative for syncope, light-headedness and headaches.  Psychiatric/Behavioral: Negative for dysphoric mood.   Objective:  BP 120/70   Pulse (!) 104   Temp 97.9 F (36.6 C) (Temporal)   Ht 5' 0.5" (1.537 m)   Wt 158 lb 8 oz (71.9 kg)   SpO2 98%   BMI 30.45 kg/m   Wt Readings from Last 3 Encounters:  07/15/20 158 lb 8 oz (71.9 kg)  07/11/20 159 lb 9.6 oz (72.4 kg)  04/11/20 158 lb 12.8 oz (72 kg)      Physical Exam Constitutional:      General: She is not in acute distress.Vital signs are normal.     Appearance: Normal appearance. She is well-developed and well-nourished. She is not ill-appearing or toxic-appearing.  HENT:     Head: Normocephalic.     Right Ear: Hearing, tympanic membrane, ear canal and external ear normal. Tympanic membrane is not erythematous, retracted or bulging.     Left Ear: Hearing, tympanic membrane, ear canal and external ear normal. Tympanic membrane is not erythematous, retracted or  bulging.     Nose: No mucosal edema or rhinorrhea.     Right Sinus: No maxillary sinus tenderness or frontal sinus tenderness.     Left Sinus: No maxillary sinus tenderness or frontal sinus tenderness.     Mouth/Throat:     Mouth: Oropharynx is clear and moist and mucous membranes are normal.     Pharynx: Uvula midline.  Eyes:     General: Lids are normal. Lids are everted, no foreign bodies appreciated.     Extraocular Movements: EOM normal.     Conjunctiva/sclera: Conjunctivae normal.     Pupils: Pupils are equal, round, and reactive to light.  Neck:     Thyroid: No thyroid mass or thyromegaly.     Vascular: No carotid bruit.     Trachea: Trachea normal.  Cardiovascular:     Rate and Rhythm: Normal rate and regular rhythm.     Pulses: Normal pulses and intact distal pulses.     Heart sounds: Normal heart sounds, S1 normal and S2 normal. No murmur heard. No friction rub. No gallop.   Pulmonary:     Effort: Pulmonary effort is normal. No tachypnea or respiratory distress.     Breath sounds: Normal breath sounds. No decreased breath sounds, wheezing, rhonchi or rales.  Abdominal:     General: Abdomen is protuberant. Bowel sounds are normal.     Palpations: Abdomen is soft.     Tenderness: There is no abdominal tenderness.  Musculoskeletal:     Cervical back: Normal range of motion and neck supple.  Skin:    General: Skin is warm, dry and intact.     Findings: No rash.  Neurological:     Mental Status: She is alert.  Psychiatric:        Mood and Affect: Mood is not anxious or depressed.        Speech: Speech normal.        Behavior: Behavior normal. Behavior is cooperative.        Thought Content: Thought content normal.  Cognition and Memory: Cognition and memory normal.        Judgment: Judgment normal.       Results for orders placed or performed in visit on 07/09/20  VITAMIN D 25 Hydroxy (Vit-D Deficiency, Fractures)  Result Value Ref Range   VITD 27.99 (L)  30.00 - 100.00 ng/mL  Comprehensive metabolic panel  Result Value Ref Range   Sodium 143 135 - 145 mEq/L   Potassium 3.3 (L) 3.5 - 5.1 mEq/L   Chloride 103 96 - 112 mEq/L   CO2 34 (H) 19 - 32 mEq/L   Glucose, Bld 47 (LL) 70 - 99 mg/dL   BUN 14 6 - 23 mg/dL   Creatinine, Ser 1.05 0.40 - 1.20 mg/dL   Total Bilirubin 0.3 0.2 - 1.2 mg/dL   Alkaline Phosphatase 73 39 - 117 U/L   AST 13 0 - 37 U/L   ALT 16 0 - 35 U/L   Total Protein 6.8 6.0 - 8.3 g/dL   Albumin 3.5 3.5 - 5.2 g/dL   GFR 54.34 (L) >60.00 mL/min   Calcium 9.3 8.4 - 10.5 mg/dL  Lipid panel  Result Value Ref Range   Cholesterol 201 (H) 0 - 200 mg/dL   Triglycerides 157.0 (H) 0.0 - 149.0 mg/dL   HDL 53.80 >39.00 mg/dL   VLDL 31.4 0.0 - 40.0 mg/dL   LDL Cholesterol 116 (H) 0 - 99 mg/dL   Total CHOL/HDL Ratio 4    NonHDL 147.68   Hemoglobin A1c  Result Value Ref Range   Hgb A1c MFr Bld 8.1 (H) 4.6 - 6.5 %    This visit occurred during the SARS-CoV-2 public health emergency.  Safety protocols were in place, including screening questions prior to the visit, additional usage of staff PPE, and extensive cleaning of exam room while observing appropriate contact time as indicated for disinfecting solutions.   COVID 19 screen:  No recent travel or known exposure to COVID19 The patient denies respiratory symptoms of COVID 19 at this time. The importance of social distancing was discussed today.   Assessment and Plan   The patient's preventative maintenance and recommended screening tests for an annual wellness exam were reviewed in full today. Brought up to date unless services declined.  Counselled on the importance of diet, exercise, and its role in overall health and mortality. The patient's FH and SH was reviewed, including their home life, tobacco status, and drug and alcohol status.   Colonoscopy was normal in 2014, repeat in 10 years... but having blood in stool will do next week. Nml endoscopyfor GI bleed in  12/2014. PAP partial hysterectomy 2001, cervix remains, last pap 2015, no further indicated. No family history of ovarian cancer. Asymptomatic. Last DEXA Stable osteopenia, nml vit D. 02/2013 repeat in 5 years.. 03/28/2018 stable osteopenia -1.9 Mammo: nml 02/2020 PNA 23 due  COVID x 3 uptdate   Problem List Items Addressed This Visit    Aortic atherosclerosis ([place]) (Chronic)    ON statin. LDL goal < 70.      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 On chronic steroid. Causing nausea and vomiting with dose lowering.      Hyperlipidemia associated with type 2 diabetes mellitus ([facility]) (Chronic)    Improving but likely better due to minimal po intake.  On high dose statin and welchol. Not interested in addition treatment at this time.      Hypertension associated with diabetes ([place]) (Chronic)    Stable, chronic.  Continue current medication.         Type 2 diabetes mellitus with hyperglycemia (HCC) (Chronic)    IMproving control with addition of metformin.. so recnet lows... continue metformin  And decrease insulin to 55 Units daily.  May beed to D/C metformin if causing GI upset.       Other Visit Diagnoses    Medicare annual wellness visit, subsequent    -  Primary   Hypokalemia       Relevant Orders   Basic metabolic panel   Need for 23-polyvalent pneumococcal polysaccharide vaccine       Relevant Orders   Pneumococcal polysaccharide vaccine 23-valent greater than or equal to 2yo subcutaneous/IM       Eliezer Lofts, MD

## 2020-07-15 NOTE — Assessment & Plan Note (Signed)
Reviewed last GI note. On chronic steroid. Causing nausea and vomiting with dose lowering.

## 2020-07-15 NOTE — Patient Instructions (Addendum)
For now.. decrease insulin to 55 Units. Call if lows continuing.  Continue metformin low dose.  Increase vit D to 2000 IU x 2 weeks  Start  prescription potassium  Twice daily x 10 days.  Make appt for labs recheck of potassium. If in normal range we will plan on decreasing down to 20 MEQ once daily after that.

## 2020-07-15 NOTE — Assessment & Plan Note (Signed)
Stable, chronic.  Continue current medication.    

## 2020-07-24 ENCOUNTER — Encounter: Payer: Self-pay | Admitting: Family Medicine

## 2020-07-24 LAB — HM COLONOSCOPY

## 2020-07-25 ENCOUNTER — Encounter: Payer: Self-pay | Admitting: Family Medicine

## 2020-07-29 NOTE — Progress Notes (Signed)
New order for 300 mg resent to [facility] with Approval from pt insurance.

## 2020-07-30 ENCOUNTER — Other Ambulatory Visit: Payer: Self-pay

## 2020-07-30 ENCOUNTER — Other Ambulatory Visit (INDEPENDENT_AMBULATORY_CARE_PROVIDER_SITE_OTHER): Payer: Medicare Other

## 2020-07-30 ENCOUNTER — Other Ambulatory Visit: Payer: Self-pay | Admitting: Neurology

## 2020-07-30 DIAGNOSIS — M545 Low back pain, unspecified: Secondary | ICD-10-CM

## 2020-07-30 DIAGNOSIS — E876 Hypokalemia: Secondary | ICD-10-CM

## 2020-07-30 LAB — BASIC METABOLIC PANEL
BUN: 16 mg/dL (ref 6–23)
CO2: 31 mEq/L (ref 19–32)
Calcium: 9.2 mg/dL (ref 8.4–10.5)
Chloride: 100 mEq/L (ref 96–112)
Creatinine, Ser: 1.07 mg/dL (ref 0.40–1.20)
GFR: 53.1 mL/min — ABNORMAL LOW (ref >60.00)
Glucose, Bld: 143 mg/dL — ABNORMAL HIGH (ref 70–99)
Potassium: 3.5 mEq/L (ref 3.5–5.1)
Sodium: 138 mEq/L (ref 135–145)

## 2020-07-31 ENCOUNTER — Telehealth: Payer: Self-pay | Admitting: Neurology

## 2020-07-31 DIAGNOSIS — M545 Low back pain, unspecified: Secondary | ICD-10-CM

## 2020-07-31 DIAGNOSIS — G8929 Other chronic pain: Secondary | ICD-10-CM

## 2020-07-31 MED ORDER — GABAPENTIN 300 MG PO CAPS: 600.0000 mg | ORAL_CAPSULE | Freq: Every day | ORAL | 0 refills | Status: DC

## 2020-07-31 MED ORDER — POTASSIUM CHLORIDE CRYS ER 20 MEQ PO TBCR
20.0000 meq | EXTENDED_RELEASE_TABLET | Freq: Every day | ORAL | 3 refills | Status: DC
Start: 2020-07-31 — End: 2021-07-14

## 2020-07-31 NOTE — Telephone Encounter (Signed)
Refill sent.

## 2020-07-31 NOTE — Telephone Encounter (Signed)
I approve the gabapentin

## 2020-08-11 ENCOUNTER — Telehealth: Payer: Self-pay

## 2020-08-11 NOTE — Telephone Encounter (Signed)
[facility] [facility] - Client TELEPHONE ADVICE RECORD [facility] Patient Name: Debra Leach Gender: Female DOB: [birth year] Age: 70 Y 3 M 7 D Return Phone Number: [phone] (Primary), [phone] (Secondary) Address: City/State/Zip: [place] [address] Client [facility] - Client Client Site [place] - Day Physician Eliezer Lofts - MD Contact Type Call Who Is Calling Patient / Member / Family / Caregiver Call Type Triage / Clinical Relationship To Patient Self Return Phone Number [phone] (Primary) Chief Complaint Dizziness Reason for Call Symptomatic / Request for [place] states, pt has dizziness and lightheaded. Translation No Nurse Assessment Nurse: Vallery Sa, RN, Cathy Date/Time (Eastern Time): 08/11/2020 10:25:27 AM Confirm and document reason for call. If symptomatic, describe symptoms. ---Debra Leach states that she developed dizziness a couple of weeks ago. No severe or worsening breathing difficulty or blueness around her lips. No chest pain. No fever. No injury to head and ears. Alert and responsive. Does the patient have any new or worsening symptoms? ---Yes Will a triage be completed? ---Yes Related visit to physician within the last 2 weeks? ---No Does the PT have any chronic conditions? (i.e. diabetes, asthma, this includes High risk factors for pregnancy, etc.) ---Yes List chronic conditions. ---High Blood Pressure, Blood clots in her lungs about 2 years ago, Diabetes, Migraines, Auto-immune disorders, Arthritis Is this a behavioral health or substance abuse call? ---No Guidelines Guideline Title Affirmed Question Affirmed Notes Nurse Date/Time (Eastern Time) Dizziness - Lightheadedness [1] Dizziness caused by heat exposure, sudden standing, or poor fluid intake AND [2] no improvement after 2 hours of rest and fluids Tr[place]ull, RN, Cathy 08/11/2020  10:28:34 AM Disp. Time Eilene Ghazi Time) Disposition Final User 08/11/2020 10:32:30 AM See HCP within 4 Hours (or PCP triage) Yes Tr[place]ull, RN, Tye [place] PLEASE NOTE: All timestamps contained within this report are represented as [place] Standard Time. CONFIDENTIALTY NOTICE: This fax transmission is intended only for the addressee. It contains information that is legally privileged, confidential or otherwise protected from use or disclosure. If you are not the intended recipient, you are strictly prohibited from reviewing, disclosing, copying using or disseminating any of this information or taking any action in reliance on or regarding this information. If you have received this fax in error, please notify us immediately by telephone so that we can arrange for its return to [place]. Phone: [phone], Toll-Free: [phone], Fax: [phone] Page: 2 of 2 Call Id: [id number] [place] Disagree/Comply Comply Caller Understands Yes PreDisposition Call Doctor Care Advice Given Per Guideline SEE HCP (OR PCP TRIAGE) WITHIN 4 HOURS: * IF OFFICE WILL BE OPEN: You need to be seen within the next 3 or 4 hours. Call your doctor (or NP/PA) now or as soon as the office opens. DRINK FLUIDS: * Drink several glasses of fruit juice, other clear fluids or water. * This will improve hydration and blood glucose. * If the weather is hot or you have a fever, make sure the fluids are cold. LIE DOWN AND REST: * Lie down with feet elevated for 1 hour. * This will improve circulation and increase blood flow to the brain. CALL BACK IF: * Passes out (faints) * You become worse CARE ADVICE given per Dizziness (Adult) guideline. Comments User: Berton Mount, RN Date/Time Eilene Ghazi Time): 08/11/2020 10:34:26 AM After triage, [name] states that she wants to wait to see her MD. User: Berton Mount, RN Date/Time Eilene Ghazi Time): 08/11/2020 10:36:07 AM Transferred to [facility] to check on appointment options. Referrals  REFERRED TO PCP  OFFICE [place] transfer to backline

## 2020-08-11 NOTE — Telephone Encounter (Signed)
Pt already has appt with Dr Diona Browner on 08/15/20. See access nurse note. Sending note to Dr Diona Browner and Butch Penny CMA.

## 2020-08-12 NOTE — Progress Notes (Signed)
300MG  Vypeti- Approval Auth #: [id number]. Valid from 08/04/20 to 08/04/21. Sent to scanning for chart

## 2020-08-15 ENCOUNTER — Ambulatory Visit (INDEPENDENT_AMBULATORY_CARE_PROVIDER_SITE_OTHER): Payer: Medicare Other | Admitting: Family Medicine

## 2020-08-15 ENCOUNTER — Other Ambulatory Visit: Payer: Self-pay

## 2020-08-15 VITALS — BP 150/84 | HR 83 | Temp 97.9°F | Ht 60.5 in | Wt 156.8 lb

## 2020-08-15 DIAGNOSIS — I10 Essential (primary) hypertension: Secondary | ICD-10-CM

## 2020-08-15 DIAGNOSIS — R110 Nausea: Secondary | ICD-10-CM | POA: Insufficient documentation

## 2020-08-15 DIAGNOSIS — I152 Hypertension secondary to endocrine disorders: Secondary | ICD-10-CM | POA: Diagnosis not present

## 2020-08-15 DIAGNOSIS — E1159 Type 2 diabetes mellitus with other circulatory complications: Secondary | ICD-10-CM

## 2020-08-15 DIAGNOSIS — R42 Dizziness and giddiness: Secondary | ICD-10-CM | POA: Diagnosis not present

## 2020-08-15 LAB — CBC WITH DIFFERENTIAL/PLATELET
Basophils Absolute: 0.1 10*3/uL (ref 0.0–0.1)
Basophils Relative: 0.7 % (ref 0.0–3.0)
Eosinophils Absolute: 0.3 10*3/uL (ref 0.0–0.7)
Eosinophils Relative: 3.2 % (ref 0.0–5.0)
HCT: 37.3 % (ref 36.0–46.0)
Hemoglobin: 12 g/dL (ref 12.0–15.0)
Lymphocytes Relative: 15.7 % (ref 12.0–46.0)
Lymphs Abs: 1.5 10*3/uL (ref 0.7–4.0)
MCHC: 32.3 g/dL (ref 30.0–36.0)
MCV: 80.4 fl (ref 78.0–100.0)
Monocytes Absolute: 0.7 10*3/uL (ref 0.1–1.0)
Monocytes Relative: 7.6 % (ref 3.0–12.0)
Neutro Abs: 7 10*3/uL (ref 1.4–7.7)
Neutrophils Relative %: 72.8 % (ref 43.0–77.0)
Platelets: 421 10*3/uL — ABNORMAL HIGH (ref 150.0–400.0)
RBC: 4.64 Mil/uL (ref 3.87–5.11)
RDW: 16.5 % — ABNORMAL HIGH (ref 11.5–15.5)
WBC: 9.7 10*3/uL (ref 4.0–10.5)

## 2020-08-15 LAB — TSH: TSH: 1.43 u[IU]/mL (ref 0.35–4.50)

## 2020-08-15 LAB — VITAMIN B12: Vitamin B-12: 243 pg/mL (ref 211–911)

## 2020-08-15 NOTE — Assessment & Plan Note (Signed)
Elevated in office today given migraine.

## 2020-08-15 NOTE — Assessment & Plan Note (Addendum)
Normal neuro exam, except unsteady when standing and stepping onto step for exam.  No red flags, no cerebellar exam abnormalities. ? Med SE.. she has been on gabapentin a long time. Cannot do orthostatic today given migraine but pt notes both sugar and BP are normal when she is lightheaded at home.  Eval with labs.  Discuss with neurologist at next [place].

## 2020-08-15 NOTE — Assessment & Plan Note (Addendum)
?   SE to metformin vs from pancreatitis.  Hold metformin depsite improved control of DM on this med..  If not improving could also consider gastroparesis from DM as cause, consider GI eval or trial of reglan.

## 2020-08-15 NOTE — Telephone Encounter (Signed)
Per chart review tab pt has had visit with Dr Diona Browner 08/15/20.

## 2020-08-15 NOTE — Progress Notes (Signed)
Patient ID: Debra Leach, female    DOB: Jan 09, 1951, 70 y.o.   MRN: [id number]  This visit was conducted in person.  BP (!) 150/84   Pulse 83   Temp 97.9 F (36.6 C) (Temporal)   Ht 5' 0.5" (1.537 m)   Wt 156 lb 12 oz (71.1 kg)   SpO2 100%   BMI 30.11 kg/m    CC:  Chief Complaint  Patient presents with  . Dizziness    X weeks   . Nausea    Subjective:   HPI: Debra Leach is a 70 y.o. female  With autoimmune pancreatitis and Dmpresenting on 08/15/2020 for Dizziness (X weeks ) and Nausea  She reports  She has been experiencing dizziness in last several months. Describes as lightheaded. Makes her unstable.  Now she has to sit for a minute before she is standing Up. Also happens when she stands for a while in the kitchen.  No vertigo, no room spinning. Does have trigger if she moves her head side to side.  No recent med changes except metformin in 04/2020  Has been on gabapentin and vyepti for a while. Her pancreatitis causes nausea... here lately she had episode of emesis.  BP at home 125/65 BS 121... was lightheaded at this time. CBGs 92-147   BP  Elevated today given migraine. BP Readings from Last 3 Encounters:  08/15/20 (!) 150/84  07/15/20 120/70  07/11/20 120/73    NO CP,  Stable SOB.,   Spoke with pharmacist about meds and interaction.. none specifically causing interaction.  Mentioned gastroparesis possibility.     She  Drinks lots of water each day... almost 64 ox a day. Relevant past medical, surgical, family and social history reviewed and updated as indicated. Interim medical history since our last visit reviewed. Allergies and medications reviewed and updated. Outpatient Medications Prior to Visit  Medication Sig Dispense Refill  . apixaban (ELIQUIS) 2.5 MG TABS tablet Take 1 tablet (2.5 mg total) by mouth 2 (two) times daily. 180 tablet 3  . atorvastatin (LIPITOR) 80 MG tablet TAKE 1 TABLET BY MOUTH DAILY. 90 tablet 3  .  Blood Glucose Monitoring Suppl (ONETOUCH VERIO REFLECT) w/Device KIT 2 (two) times daily. for testing    . budesonide (ENTOCORT EC) 3 MG 24 hr capsule Take 2 capsules by mouth daily.    . cholecalciferol (VITAMIN D) 1000 units tablet Take 1,000 Units by mouth daily.    . colesevelam (WELCHOL) 625 MG tablet TAKE 3 TABLETS BY MOUTH 2 TIMES DAILY WITH A MEAL. 540 tablet 3  . Eptinezumab-jjmr (VYEPTI) 100 MG/ML injection Inject 100 mg into the vein.    Marland Kitchen gabapentin (NEURONTIN) 100 MG capsule Take 100 mg by mouth 3 (three) times daily as needed.    . gabapentin (NEURONTIN) 300 MG capsule Take 2 capsules (600 mg total) by mouth at bedtime. 180 capsule 0  . insulin glargine (LANTUS SOLOSTAR) 100 UNIT/ML Solostar Pen Inject 55 Units into the skin daily.    . Lancets (ONETOUCH DELICA PLUS [id number]) MISC USE TO CHECK BLOOD SUGAR TWO TIMES DAILY 100 each 12  . losartan-hydrochlorothiazide (HYZAAR) 50-12.5 MG tablet TAKE 1 TABLET BY MOUTH DAILY. 90 tablet 1  . metFORMIN (GLUCOPHAGE XR) 500 MG 24 hr tablet Take 1 tablet (500 mg total) by mouth daily with breakfast. 30 tablet 11  . omeprazole (PRILOSEC) 20 MG capsule Take 20 mg by mouth 2 (two) times daily before a meal.    . ONETOUCH VERIO  test strip USE TO CHECK BLOOD SUGAR TWO TIMES A DAY 100 strip 12  . potassium chloride SA (KLOR-CON) 20 MEQ tablet Take 1 tablet (20 mEq total) by mouth daily. 90 tablet 3  . promethazine (PHENERGAN) 12.5 MG tablet Take 12.5 mg by mouth every 6 (six) hours as needed for nausea or vomiting.    Marland Kitchen UBRELVY 100 MG TABS TAKE 1 TABLET BY MOUTH AS NEEDED (MAY REPEAT DOSE AFTER 2 HOURS IF NEEDED. MAXIMUM 2 TABLETS IN 24 HOURS). 10 tablet 3  . ULTICARE SHORT PEN NEEDLES 31G X 8 MM MISC USE TO INJECT INSULIN DAILY 100 each 2   No facility-administered medications prior to visit.     Per HPI unless specifically indicated in ROS section below Review of Systems  Constitutional: Negative for fatigue and fever.  HENT: Negative for  congestion.   Eyes: Negative for pain.  Respiratory: Negative for cough and shortness of breath.   Cardiovascular: Negative for chest pain, palpitations and leg swelling.  Gastrointestinal: Negative for abdominal pain.  Genitourinary: Negative for dysuria and vaginal bleeding.  Musculoskeletal: Negative for back pain.  Neurological: Negative for syncope, light-headedness and headaches.  Psychiatric/Behavioral: Negative for dysphoric mood.   Objective:  BP (!) 150/84   Pulse 83   Temp 97.9 F (36.6 C) (Temporal)   Ht 5' 0.5" (1.537 m)   Wt 156 lb 12 oz (71.1 kg)   SpO2 100%   BMI 30.11 kg/m   Wt Readings from Last 3 Encounters:  08/15/20 156 lb 12 oz (71.1 kg)  07/15/20 158 lb 8 oz (71.9 kg)  07/11/20 159 lb 9.6 oz (72.4 kg)      Physical Exam Constitutional:      General: She is not in acute distress.    Appearance: Normal appearance. She is well-developed. She is not ill-appearing or toxic-appearing.  HENT:     Head: Normocephalic.     Right Ear: Hearing, tympanic membrane, ear canal and external ear normal. Tympanic membrane is not erythematous, retracted or bulging.     Left Ear: Hearing, tympanic membrane, ear canal and external ear normal. Tympanic membrane is not erythematous, retracted or bulging.     Nose: No mucosal edema or rhinorrhea.     Right Sinus: No maxillary sinus tenderness or frontal sinus tenderness.     Left Sinus: No maxillary sinus tenderness or frontal sinus tenderness.     Mouth/Throat:     Pharynx: Uvula midline.  Eyes:     General: Lids are normal. Lids are everted, no foreign bodies appreciated.     Conjunctiva/sclera: Conjunctivae normal.     Pupils: Pupils are equal, round, and reactive to light.  Neck:     Thyroid: No thyroid mass or thyromegaly.     Vascular: No carotid bruit.     Trachea: Trachea normal.  Cardiovascular:     Rate and Rhythm: Normal rate and regular rhythm.     Pulses: Normal pulses.     Heart sounds: Normal heart  sounds, S1 normal and S2 normal. No murmur heard. No friction rub. No gallop.   Pulmonary:     Effort: Pulmonary effort is normal. No tachypnea or respiratory distress.     Breath sounds: Normal breath sounds. No decreased breath sounds, wheezing, rhonchi or rales.  Abdominal:     General: Bowel sounds are normal.     Palpations: Abdomen is soft.     Tenderness: There is no abdominal tenderness.  Musculoskeletal:     Cervical back: Normal  range of motion and neck supple.  Skin:    General: Skin is warm and dry.     Findings: No rash.  Neurological:     Mental Status: She is alert.     Cranial Nerves: Cranial nerves are intact.     Sensory: Sensation is intact.     Motor: Motor function is intact.     Coordination: Romberg sign negative. Finger-Nose-Finger Test and Heel to Shin Test normal.     Comments:  Shaky with standing inititally  Psychiatric:        Mood and Affect: Mood is not anxious or depressed.        Speech: Speech normal.        Behavior: Behavior normal. Behavior is cooperative.        Thought Content: Thought content normal.        Judgment: Judgment normal.       Results for orders placed or performed in visit on 29/52/84  Basic metabolic panel  Result Value Ref Range   Sodium 138 135 - 145 mEq/L   Potassium 3.5 3.5 - 5.1 mEq/L   Chloride 100 96 - 112 mEq/L   CO2 31 19 - 32 mEq/L   Glucose, Bld 143 (H) 70 - 99 mg/dL   BUN 16 6 - 23 mg/dL   Creatinine, Ser 1.07 0.40 - 1.20 mg/dL   GFR 53.10 (L) >60.00 mL/min   Calcium 9.2 8.4 - 10.5 mg/dL    This visit occurred during the SARS-CoV-2 public health emergency.  Safety protocols were in place, including screening questions prior to the visit, additional usage of staff PPE, and extensive cleaning of exam room while observing appropriate contact time as indicated for disinfecting solutions.   COVID 19 screen:  No recent travel or known exposure to COVID19 The patient denies respiratory symptoms of COVID 19 at  this time. The importance of social distancing was discussed today.   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      Lightheadedness - Primary    Normal neuro exam, except unsteady when standing and stepping onto step for exam.  No red flags, no cerebellar exam abnormalities. ? Med SE.. she has been on gabapentin a long time. Cannot do orthostatic today given migraine but pt notes both sugar and BP are normal when she is lightheaded at home.  Eval with labs.  Discuss with neurologist at next [place].      Relevant Orders   CBC with Differential/Platelet   Vitamin B12   TSH   Nausea    ? SE to metformin vs from pancreatitis.  Hold metformin depsite improved control of DM on this med..  If not improving could also consider gastroparesis from DM as cause, consider GI eval or trial of reglan.            Eliezer Lofts, MD

## 2020-08-15 NOTE — Patient Instructions (Addendum)
Please stop at the lab to have labs drawn. Hold metformin for 1-2 weeks... call with update on symptoms.  Talk with neurologist at next [facility] about  lightheadedness balance issues and possible med SE.

## 2020-09-18 ENCOUNTER — Telehealth: Payer: Self-pay

## 2020-09-18 NOTE — Chronic Care Management (AMB) (Addendum)
Chronic Care Management Pharmacy Assistant   Name: Debra Leach  MRN: [id number] DOB: May 12, 1951  Reason for Encounter: Disease State- Hypertension and Diabetes   Conditions to be addressed/monitored: HTN and DMII   Recent office visits:  08/15/20 Dr. Eliezer Lofts- PCP- Hold metformin due to nausea  07/15/20- Dr. Eliezer Lofts- PCP- Discontinued potassium gluconate 595 mg and started Klor-Con 20 meq. Decrease insulin to 55 units daily. Increase Vitamin D to 2000 IU for 2 weeks.  04/11/20- Dr. Eliezer Lofts - PCP started pt on Metformin 500 mg daily. Increased potassium to 2 tabs daily for 3 days. Follow up 3 months.   Recent consult visits:  07/11/20- Dr. Metta Clines- Neurology- Increased omeprazole to 20 mg BID. Discontinued Alprazolam, Budesonide. Pt states she is using 60 units of insulin daily. Increased Vyepti to 300 mg every 12 weeks. Follow up 6 months.  07/09/20- Telephone Encounter- Urology- Instructed to hold Eliquis 07/23/20 and restart 07/25/20.   04/14/20- Dr. Aviva Signs- Gastro- Decreased Entocort from 3 caps daily to 2 daily starting 05/07/20.    Hospital visits:  04/07/20 ED Visit for right calf pain. Not admitted.   Medications: Outpatient Encounter Medications as of 09/18/2020  Medication Sig   apixaban (ELIQUIS) 2.5 MG TABS tablet Take 1 tablet (2.5 mg total) by mouth 2 (two) times daily.   atorvastatin (LIPITOR) 80 MG tablet TAKE 1 TABLET BY MOUTH DAILY.   Blood Glucose Monitoring Suppl (ONETOUCH VERIO REFLECT) w/Device KIT 2 (two) times daily. for testing   budesonide (ENTOCORT EC) 3 MG 24 hr capsule Take 2 capsules by mouth daily.   cholecalciferol (VITAMIN D) 1000 units tablet Take 1,000 Units by mouth daily.   colesevelam (WELCHOL) 625 MG tablet TAKE 3 TABLETS BY MOUTH 2 TIMES DAILY WITH A MEAL.   Eptinezumab-jjmr (VYEPTI) 100 MG/ML injection Inject 100 mg into the vein.   gabapentin (NEURONTIN) 100 MG capsule Take 100 mg by mouth 3 (three) times daily as  needed.   gabapentin (NEURONTIN) 300 MG capsule Take 2 capsules (600 mg total) by mouth at bedtime.   insulin glargine (LANTUS SOLOSTAR) 100 UNIT/ML Solostar Pen Inject 55 Units into the skin daily.   Lancets (ONETOUCH DELICA PLUS [id number]) MISC USE TO CHECK BLOOD SUGAR TWO TIMES DAILY   losartan-hydrochlorothiazide (HYZAAR) 50-12.5 MG tablet TAKE 1 TABLET BY MOUTH DAILY.   metFORMIN (GLUCOPHAGE XR) 500 MG 24 hr tablet Take 1 tablet (500 mg total) by mouth daily with breakfast.   omeprazole (PRILOSEC) 20 MG capsule Take 20 mg by mouth 2 (two) times daily before a meal.   ONETOUCH VERIO test strip USE TO CHECK BLOOD SUGAR TWO TIMES A DAY   potassium chloride SA (KLOR-CON) 20 MEQ tablet Take 1 tablet (20 mEq total) by mouth daily.   promethazine (PHENERGAN) 12.5 MG tablet Take 12.5 mg by mouth every 6 (six) hours as needed for nausea or vomiting.   UBRELVY 100 MG TABS TAKE 1 TABLET BY MOUTH AS NEEDED (MAY REPEAT DOSE AFTER 2 HOURS IF NEEDED. MAXIMUM 2 TABLETS IN 24 HOURS).   ULTICARE SHORT PEN NEEDLES 31G X 8 MM MISC USE TO INJECT INSULIN DAILY   No facility-administered encounter medications on file as of 09/18/2020.   Recent Relevant Labs: Lab Results  Component Value Date/Time   HGBA1C 8.1 (H) 07/09/2020 07:52 AM   HGBA1C 9.8 (H) 04/08/2020 07:56 AM   MICROALBUR 1.4 03/27/2018 10:49 AM   MICROALBUR <0.7 08/12/2016 10:35 AM    Kidney Function Lab Results  Component Value Date/Time   CREATININE 1.07 07/30/2020 08:57 AM   CREATININE 1.05 07/09/2020 07:52 AM   CREATININE 0.84 06/08/2016 10:32 AM   GFR 53.10 (L) 07/30/2020 08:57 AM   GFRNONAA >60 04/07/2020 09:55 AM   GFRAA >60 11/21/2019 11:36 AM     Current antihyperglycemic regimen:  Metformin 500 mg - 1 tablet with breakfast (currently holding med per PCP) Lantus Solostar 100 units/mL- Inject 55 units daily  What recent interventions/DTPs have been made to improve glycemic control:  D/C metformin due to GI upset. Decreased  Lantus from 60 units to 55 units.  Have there been any recent hospitalizations or ED visits since last visit with CPP? Yes   Patient denies hypoglycemic symptoms, including Pale, Sweaty, Shaky, Hungry, Nervous/irritable and Vision changes   Patient denies hyperglycemic symptoms, including blurry vision, excessive thirst, fatigue, polyuria and weakness   How often are you checking your blood sugar? once daily   What are your blood sugars ranging?  Fasting: 126, 127, 112, 104, 115, 84, 108, 110, 114, 153 Before meals: N/A After meals: N/A Bedtime: N/A  During the week, how often does your blood glucose drop below 70? Never states none below 80 since February.  Are you checking your feet daily/regularly? Yes, feet look good. Denies any wounds or sores.   Adherence Review: Is the patient currently on a STATIN medication? Yes Is the patient currently on ACE/ARB medication? Yes Does the patient have >5 day gap between last estimated fill dates? No  Reviewed chart prior to disease state call. Spoke with patient regarding BP  Recent Office Vitals: BP Readings from Last 3 Encounters:  08/15/20 (!) 150/84  07/15/20 120/70  07/11/20 120/73   Pulse Readings from Last 3 Encounters:  08/15/20 83  07/15/20 (!) 104  07/11/20 95    Wt Readings from Last 3 Encounters:  08/15/20 156 lb 12 oz (71.1 kg)  07/15/20 158 lb 8 oz (71.9 kg)  07/11/20 159 lb 9.6 oz (72.4 kg)     Kidney Function Lab Results  Component Value Date/Time   CREATININE 1.07 07/30/2020 08:57 AM   CREATININE 1.05 07/09/2020 07:52 AM   CREATININE 0.84 06/08/2016 10:32 AM   GFR 53.10 (L) 07/30/2020 08:57 AM   GFRNONAA >60 04/07/2020 09:55 AM   GFRAA >60 11/21/2019 11:36 AM    BMP Latest Ref Rng & Units 07/30/2020 07/09/2020 04/08/2020  Glucose 70 - 99 mg/dL 143(H) 47(LL) 79  BUN 6 - 23 mg/dL '16 14 18  ' Creatinine 0.40 - 1.20 mg/dL 1.07 1.05 1.02  BUN/Creat Ratio 6 - 22 Ratio - - -  Sodium 135 - 145 mEq/L 138 143 142   Potassium 3.5 - 5.1 mEq/L 3.5 3.3(L) 3.4(L)  Chloride 96 - 112 mEq/L 100 103 102  CO2 19 - 32 mEq/L 31 34(H) 32  Calcium 8.4 - 10.5 mg/dL 9.2 9.3 9.1    Current antihypertensive regimen:  Losartan-HCTZ 50-12.5. 1 tablet daily confirms she is taking this.   How often are you checking your Blood Pressure? when feeling symptomatic   Current home BP readings:  127/78 121/81  States she is not a heavy salt user.  Uses arm cuff to check blood pressure.   What recent interventions/DTPs have been made by any provider to improve Blood Pressure control since last CPP Visit: No recent interventions ror changes  Any recent hospitalizations or ED visits since last visit with CPP? Yes   What diet changes have been made to improve Blood Pressure Control?  No changes in diet. States she does not eat a lot of processed junk foods. Does not like salt much.   What exercise is being done to improve your Blood Pressure Control?  Not much exercise recently due to being dizzy and light headedness seeing ENT next Wed. She does walk [place].   Adherence Review: Is the patient currently on ACE/ARB medication? Yes Does the patient have >5 day gap between last estimated fill dates? No   Star Rating Drugs: Atorvastatin 80 mg 07/30/20  90 DS Losartan HCTCZ- 50-12.5 07/30/20  90 DS Metformin 500 mg 07/30/20  90 DS   Follow-Up:  Pharmacist Review  Debbora Dus, CPP notified  Margaretmary Dys, [place] Pharmacy Assistant [phone]  I have reviewed the care management and care coordination activities outlined in this encounter and I am certifying that I agree with the content of this note. No further action required.  Debbora Dus, PharmD Clinical Pharmacist [facility] [phone]

## 2020-10-13 LAB — HM DIABETES FOOT EXAM

## 2020-10-21 ENCOUNTER — Telehealth: Payer: Self-pay | Admitting: Family Medicine

## 2020-10-21 DIAGNOSIS — E119 Type 2 diabetes mellitus without complications: Secondary | ICD-10-CM

## 2020-10-21 NOTE — Telephone Encounter (Signed)
-----   Message from Ellamae Sia sent at 10/06/2020 11:53 AM EDT ----- Regarding: Lab orders for Wednesday, 5.18.22 Lab orders for f/u appt

## 2020-10-22 ENCOUNTER — Other Ambulatory Visit: Payer: Self-pay | Admitting: Internal Medicine

## 2020-10-22 ENCOUNTER — Other Ambulatory Visit: Payer: Self-pay

## 2020-10-22 ENCOUNTER — Other Ambulatory Visit (INDEPENDENT_AMBULATORY_CARE_PROVIDER_SITE_OTHER): Payer: Medicare Other

## 2020-10-22 ENCOUNTER — Other Ambulatory Visit: Payer: Self-pay | Admitting: Family Medicine

## 2020-10-22 DIAGNOSIS — E119 Type 2 diabetes mellitus without complications: Secondary | ICD-10-CM | POA: Diagnosis not present

## 2020-10-22 DIAGNOSIS — K861 Other chronic pancreatitis: Secondary | ICD-10-CM

## 2020-10-22 LAB — COMPREHENSIVE METABOLIC PANEL
ALT: 20 U/L (ref 0–35)
AST: 15 U/L (ref 0–37)
Albumin: 3.7 g/dL (ref 3.5–5.2)
Alkaline Phosphatase: 84 U/L (ref 39–117)
BUN: 17 mg/dL (ref 6–23)
CO2: 30 mEq/L (ref 19–32)
Calcium: 9.3 mg/dL (ref 8.4–10.5)
Chloride: 102 mEq/L (ref 96–112)
Creatinine, Ser: 1.07 mg/dL (ref 0.40–1.20)
GFR: 53.01 mL/min — ABNORMAL LOW (ref >60.00)
Glucose, Bld: 130 mg/dL — ABNORMAL HIGH (ref 70–99)
Potassium: 3.7 mEq/L (ref 3.5–5.1)
Sodium: 142 mEq/L (ref 135–145)
Total Bilirubin: 0.4 mg/dL (ref 0.2–1.2)
Total Protein: 7.4 g/dL (ref 6.0–8.3)

## 2020-10-22 LAB — LIPID PANEL
Cholesterol: 255 mg/dL — ABNORMAL HIGH (ref 0–200)
HDL: 60.4 mg/dL (ref >39.00)
NonHDL: 194.59
Total CHOL/HDL Ratio: 4
Triglycerides: 220 mg/dL — ABNORMAL HIGH (ref 0.0–149.0)
VLDL: 44 mg/dL — ABNORMAL HIGH (ref 0.0–40.0)

## 2020-10-22 LAB — HEMOGLOBIN A1C: Hgb A1c MFr Bld: 9.3 % — ABNORMAL HIGH (ref 4.6–6.5)

## 2020-10-22 LAB — LDL CHOLESTEROL, DIRECT: Direct LDL: 161 mg/dL

## 2020-10-22 NOTE — Telephone Encounter (Signed)
Pharmacy requests refill on: Eliquis 2.5 mg   LAST REFILL: 09/14/2019 (Q-180, R-3) LAST OV: 08/05/2020 NEXT OV: 10/24/2020 PHARMACY: [facility] requests refill on: Losartan-HCTZ 50-12.5 mg   LAST REFILL: 05/07/2020 (Q-90, R-1) LAST OV: 08/05/2020 NEXT OV: 10/24/2020 PHARMACY: [facility]

## 2020-10-23 NOTE — Progress Notes (Signed)
No critical labs need to be addressed urgently. We will discuss labs in detail at upcoming office visit.   

## 2020-10-24 ENCOUNTER — Ambulatory Visit (INDEPENDENT_AMBULATORY_CARE_PROVIDER_SITE_OTHER): Payer: Medicare Other | Admitting: Family Medicine

## 2020-10-24 ENCOUNTER — Other Ambulatory Visit: Payer: Self-pay

## 2020-10-24 VITALS — BP 140/72 | HR 85 | Temp 97.4°F | Ht 61.0 in | Wt 161.5 lb

## 2020-10-24 DIAGNOSIS — E785 Hyperlipidemia, unspecified: Secondary | ICD-10-CM

## 2020-10-24 DIAGNOSIS — I152 Hypertension secondary to endocrine disorders: Secondary | ICD-10-CM

## 2020-10-24 DIAGNOSIS — E1165 Type 2 diabetes mellitus with hyperglycemia: Secondary | ICD-10-CM

## 2020-10-24 DIAGNOSIS — K861 Other chronic pancreatitis: Secondary | ICD-10-CM | POA: Diagnosis not present

## 2020-10-24 DIAGNOSIS — E1159 Type 2 diabetes mellitus with other circulatory complications: Secondary | ICD-10-CM | POA: Diagnosis not present

## 2020-10-24 DIAGNOSIS — E1169 Type 2 diabetes mellitus with other specified complication: Secondary | ICD-10-CM

## 2020-10-24 MED ORDER — LOSARTAN POTASSIUM-HCTZ 50-12.5 MG PO TABS: 1.0000 | ORAL_TABLET | Freq: Every day | ORAL | 3 refills | Status: DC

## 2020-10-24 NOTE — Assessment & Plan Note (Signed)
Stable, chronic.  Continue current medication.   Refill Losartan HCTZ 12.5/50 mg daily

## 2020-10-24 NOTE — Patient Instructions (Addendum)
Continue current Lantus dose. Get back on track with dietary changes. If fasting blood sugars not more consistently  < 120.Marland Kitchen increase Lantus to 57 Units.  We will await ENT testing. Consider fasting acting insulin at largest meal if sugars continue to be elevated.

## 2020-10-24 NOTE — Assessment & Plan Note (Addendum)
Worsened control... may be due to being off metfomrin as well as looser diet control and less activity given dizziness.  FBS at goal more than 50% of time and 2 hour post prandial  More often the issue.  She will monitor closely and get back on track with diet... if not at goal in next month.. increase lantus 2 units to get FBS more frequently at goal and consider adding fast acting insulin.

## 2020-10-24 NOTE — Assessment & Plan Note (Signed)
Improving but not at goal on max dose statin and welchol.  Atorvastatin  80 mg daily  welchol

## 2020-10-24 NOTE — Progress Notes (Addendum)
Patient ID: Debra Leach, female    DOB: 27-Mar-1951, 70 y.o.   MRN: [id number]  This visit was conducted in person.  BP 140/72   Pulse 85   Temp (!) 97.4 F (36.3 C) (Temporal)   Ht '5\' 1"'  (1.549 m)   Wt 161 lb 8 oz (73.3 kg)   SpO2 98%   BMI 30.52 kg/m    CC:  Chief Complaint  Patient presents with  . Follow-up    3 mos- DM     Subjective:   HPI: Debra Leach is a 70 y.o. female presenting on 10/24/2020 for Follow-up (3 mos- DM )   Reviewed 10/22/2020 OV noted from Dr. Earlean Shawl GI MD treating autoimmune pancreatitis. Continues on budesonide 6 mg daily. She complains of increased abdominal girth, tenderness to palpation in the midline, and weight gain. Her symptoms and exam are not suggestive of pancreatitis. We will update MRCP of the pancreas, almost a year since this was last done, to evaluate her pain. Doubt gastric or colonic etiology with normal recent EGD/colonoscopy 07/2020.   Diabetes:  Worsened control of A1C off metformin ( cause GI distress)  On Lantus 55 units daily Lab Results  Component Value Date   HGBA1C 9.3 (H) 10/22/2020  Using medications without difficulties: Hypoglycemic episodes: none Hyperglycemic episodes:yes Feet problems: no ulcers Blood Sugars averaging: FBS 112-178,  eye exam within last year:  Hypertension: On losartan HCTZ 50/12.5 mg daily BP Readings from Last 3 Encounters:  10/24/20 140/72  08/15/20 (!) 150/84  07/15/20 120/70  Using medication without problems or lightheadedness:  Continued dizziness... saw ENT... DIX Hallpike normal. Meclizine helps some.  Limiting her activity. Has nystagmus test pending.  Using cane Chest pain with exertion: none Edema:none Short of breath: none Average home BPs: Other issues:  Elevated Cholesterol: Improving but far from goal despite high dose statin and welchol Using medications without problems: Muscle aches:  Diet compliance: moderate... she has not been as "good"  lately given was looking better off metfomrin. Exercise: minimal given dizziness. Other complaints:        Relevant past medical, surgical, family and social history reviewed and updated as indicated. Interim medical history since our last visit reviewed. Allergies and medications reviewed and updated. Outpatient Medications Prior to Visit  Medication Sig Dispense Refill  . atorvastatin (LIPITOR) 80 MG tablet TAKE 1 TABLET BY MOUTH DAILY. 90 tablet 3  . Blood Glucose Monitoring Suppl (ONETOUCH VERIO REFLECT) w/Device KIT 2 (two) times daily. for testing    . budesonide (ENTOCORT EC) 3 MG 24 hr capsule Take 2 capsules by mouth daily.    . cholecalciferol (VITAMIN D) 1000 units tablet Take 1,000 Units by mouth daily.    . colesevelam (WELCHOL) 625 MG tablet TAKE 3 TABLETS BY MOUTH 2 TIMES DAILY WITH A MEAL. 540 tablet 3  . ELIQUIS 2.5 MG TABS tablet TAKE 1 TABLET BY MOUTH 2 TIMES DAILY. 180 tablet 0  . Eptinezumab-jjmr (VYEPTI) 100 MG/ML injection Inject 300 mg into the vein.    Marland Kitchen gabapentin (NEURONTIN) 100 MG capsule Take 100 mg by mouth 3 (three) times daily as needed.    . gabapentin (NEURONTIN) 300 MG capsule Take 2 capsules (600 mg total) by mouth at bedtime. 180 capsule 0  . insulin glargine (LANTUS SOLOSTAR) 100 UNIT/ML Solostar Pen Inject 55 Units into the skin daily.    . Lancets (ONETOUCH DELICA PLUS [id number]) MISC USE TO CHECK BLOOD SUGAR TWO TIMES DAILY 100 each 12  .  losartan-hydrochlorothiazide (HYZAAR) 50-12.5 MG tablet TAKE 1 TABLET BY MOUTH DAILY. 90 tablet 1  . Meclizine HCl 25 MG CHEW Chew 25 mg by mouth every 8 (eight) hours as needed.    Marland Kitchen omeprazole (PRILOSEC) 20 MG capsule Take 20 mg by mouth 2 (two) times daily before a meal.    . ONETOUCH VERIO test strip USE TO CHECK BLOOD SUGAR TWO TIMES A DAY 100 strip 12  . potassium chloride SA (KLOR-CON) 20 MEQ tablet Take 1 tablet (20 mEq total) by mouth daily. 90 tablet 3  . promethazine (PHENERGAN) 12.5 MG tablet Take 12.5  mg by mouth every 6 (six) hours as needed for nausea or vomiting.    Marland Kitchen UBRELVY 100 MG TABS TAKE 1 TABLET BY MOUTH AS NEEDED (MAY REPEAT DOSE AFTER 2 HOURS IF NEEDED. MAXIMUM 2 TABLETS IN 24 HOURS). 10 tablet 3  . ULTICARE SHORT PEN NEEDLES 31G X 8 MM MISC USE TO INJECT INSULIN DAILY 100 each 2  . metFORMIN (GLUCOPHAGE XR) 500 MG 24 hr tablet Take 1 tablet (500 mg total) by mouth daily with breakfast. 30 tablet 11   No facility-administered medications prior to visit.     Per HPI unless specifically indicated in ROS section below Review of Systems  Constitutional: Negative for fatigue and fever.  HENT: Negative for congestion.   Eyes: Negative for pain.  Respiratory: Negative for cough and shortness of breath.   Cardiovascular: Negative for chest pain, palpitations and leg swelling.  Gastrointestinal: Positive for abdominal distention and abdominal pain. Negative for blood in stool, constipation, diarrhea and nausea.  Genitourinary: Negative for dysuria and vaginal bleeding.  Musculoskeletal: Negative for back pain.  Neurological: Positive for dizziness. Negative for syncope, light-headedness and headaches.  Psychiatric/Behavioral: Negative for dysphoric mood.   Objective:  BP 140/72   Pulse 85   Temp (!) 97.4 F (36.3 C) (Temporal)   Ht '5\' 1"'  (1.549 m)   Wt 161 lb 8 oz (73.3 kg)   SpO2 98%   BMI 30.52 kg/m   Wt Readings from Last 3 Encounters:  10/24/20 161 lb 8 oz (73.3 kg)  08/15/20 156 lb 12 oz (71.1 kg)  07/15/20 158 lb 8 oz (71.9 kg)      Physical Exam Constitutional:      General: She is not in acute distress.    Appearance: Normal appearance. She is well-developed. She is not ill-appearing or toxic-appearing.  HENT:     Head: Normocephalic.     Right Ear: Hearing, tympanic membrane, ear canal and external ear normal. Tympanic membrane is not erythematous, retracted or bulging.     Left Ear: Hearing, tympanic membrane, ear canal and external ear normal. Tympanic  membrane is not erythematous, retracted or bulging.     Nose: No mucosal edema or rhinorrhea.     Right Sinus: No maxillary sinus tenderness or frontal sinus tenderness.     Left Sinus: No maxillary sinus tenderness or frontal sinus tenderness.     Mouth/Throat:     Pharynx: Uvula midline.  Eyes:     General: Lids are normal. Lids are everted, no foreign bodies appreciated.     Conjunctiva/sclera: Conjunctivae normal.     Pupils: Pupils are equal, round, and reactive to light.  Neck:     Thyroid: No thyroid mass or thyromegaly.     Vascular: No carotid bruit.     Trachea: Trachea normal.  Cardiovascular:     Rate and Rhythm: Normal rate and regular rhythm.  Pulses: Normal pulses.     Heart sounds: Normal heart sounds, S1 normal and S2 normal. No murmur heard. No friction rub. No gallop.   Pulmonary:     Effort: Pulmonary effort is normal. No tachypnea or respiratory distress.     Breath sounds: Normal breath sounds. No decreased breath sounds, wheezing, rhonchi or rales.  Abdominal:     General: Abdomen is protuberant. Bowel sounds are normal. There is distension.     Palpations: Abdomen is soft.     Tenderness: There is abdominal tenderness in the epigastric area.  Musculoskeletal:     Cervical back: Normal range of motion and neck supple.  Skin:    General: Skin is warm and dry.     Findings: No rash.  Neurological:     Mental Status: She is alert.  Psychiatric:        Mood and Affect: Mood is not anxious or depressed.        Speech: Speech normal.        Behavior: Behavior normal. Behavior is cooperative.        Thought Content: Thought content normal.        Judgment: Judgment normal.   Diabetic foot exam: Normal inspection No skin breakdown No calluses  Normal DP pulses Normal sensation to light touch and monofilament Nails normal     Results for orders placed or performed in visit on 10/22/20  Comprehensive metabolic panel  Result Value Ref Range   Sodium  142 135 - 145 mEq/L   Potassium 3.7 3.5 - 5.1 mEq/L   Chloride 102 96 - 112 mEq/L   CO2 30 19 - 32 mEq/L   Glucose, Bld 130 (H) 70 - 99 mg/dL   BUN 17 6 - 23 mg/dL   Creatinine, Ser 1.07 0.40 - 1.20 mg/dL   Total Bilirubin 0.4 0.2 - 1.2 mg/dL   Alkaline Phosphatase 84 39 - 117 U/L   AST 15 0 - 37 U/L   ALT 20 0 - 35 U/L   Total Protein 7.4 6.0 - 8.3 g/dL   Albumin 3.7 3.5 - 5.2 g/dL   GFR 53.01 (L) >60.00 mL/min   Calcium 9.3 8.4 - 10.5 mg/dL  Lipid panel  Result Value Ref Range   Cholesterol 255 (H) 0 - 200 mg/dL   Triglycerides 220.0 (H) 0.0 - 149.0 mg/dL   HDL 60.40 >39.00 mg/dL   VLDL 44.0 (H) 0.0 - 40.0 mg/dL   Total CHOL/HDL Ratio 4    NonHDL 194.59   Hemoglobin A1c  Result Value Ref Range   Hgb A1c MFr Bld 9.3 (H) 4.6 - 6.5 %  LDL cholesterol, direct  Result Value Ref Range   Direct LDL 161.0 mg/dL    This visit occurred during the SARS-CoV-2 public health emergency.  Safety protocols were in place, including screening questions prior to the visit, additional usage of staff PPE, and extensive cleaning of exam room while observing appropriate contact time as indicated for disinfecting solutions.   COVID 19 screen:  No recent travel or known exposure to COVID19 The patient denies respiratory symptoms of COVID 19 at this time. The importance of social distancing was discussed today.   Assessment and Plan    Problem List Items Addressed This Visit    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[place]) - Primary (Chronic)    Pending MRCp  On budesonide 6 mg daily      Hyperlipidemia associated with type 2 diabetes mellitus (HCC) (Chronic)    Improving but not at  goal on max dose statin and welchol.  Atorvastatin  80 mg daily  welchol       Relevant Medications   losartan-hydrochlorothiazide (HYZAAR) 50-12.5 MG tablet   Hypertension associated with diabetes (HCC) (Chronic)    Stable, chronic.  Continue current medication.   Refill Losartan HCTZ 12.5/50 mg daily      Relevant  Medications   losartan-hydrochlorothiazide (HYZAAR) 50-12.5 MG tablet   Type 2 diabetes mellitus with hyperglycemia (HCC) (Chronic)     Worsened control... may be due to being off metfomrin as well as looser diet control and less activity given dizziness.  FBS at goal more than 50% of time and 2 hour post prandial  More often the issue.  She will monitor closely and get back on track with diet... if not at goal in next month.. increase lantus 2 units to get FBS more frequently at goal and consider adding fast acting insulin.      Relevant Medications   losartan-hydrochlorothiazide (HYZAAR) 50-12.5 MG tablet     Meds ordered this encounter  Medications  . losartan-hydrochlorothiazide (HYZAAR) 50-12.5 MG tablet    Sig: Take 1 tablet by mouth daily.    Dispense:  90 tablet    Refill:  3    Orders Placed This Encounter  Procedures  . [facility] DIABETES FOOT EXAM    This external order was created through the Results Console.     Eliezer Lofts, MD

## 2020-10-24 NOTE — Assessment & Plan Note (Signed)
Pending MRCp  On budesonide 6 mg daily

## 2020-11-05 ENCOUNTER — Other Ambulatory Visit: Payer: Self-pay | Admitting: Internal Medicine

## 2020-11-05 ENCOUNTER — Ambulatory Visit
Admission: RE | Admit: 2020-11-05 | Discharge: 2020-11-05 | Disposition: A | Discharge status: Home / Self Care | Payer: Medicare Other | Source: Ambulatory Visit | Attending: Internal Medicine | Admitting: Internal Medicine

## 2020-11-05 ENCOUNTER — Other Ambulatory Visit: Payer: Self-pay

## 2020-11-05 DIAGNOSIS — K861 Other chronic pancreatitis: Secondary | ICD-10-CM

## 2020-11-05 MED ORDER — GADOBUTROL 1 MMOL/ML IV SOLN
7.0000 mL | Freq: Once | INTRAVENOUS | Status: AC | PRN
  Administered 2020-11-05: 7 mL via INTRAVENOUS

## 2020-11-19 NOTE — Progress Notes (Addendum)
NEUROLOGY FOLLOW UP OFFICE NOTE  Debra Leach [id number]  Assessment/Plan:   Dizziness, now fairly constant throughout the day.  Will need to image brain to rule out intracranial abnormality.  Consider migrainosus etiology, however her chronic migraines overall have been stable.  If VNG was completely normal, then consider persistent postural-perceptual dizziness.  ENT note reviewed.   MRI and MRA of brain   Request VNG report ADDENDUM - VNG showed abnormal smooth pursuit, saccades and optokinetic testing - central pathology cannot be ruled out. Agree with vestibular rehab Further recommendations pending results.   Follow up in August as scheduled.  Subjective:  Debra Leach is a 70 year old right-handed female with autoimmune pancreatitis, migraines, hyperlipidemia, diabetes, GERD, osteoarthritis, and history of MVP and renal stones who follows up for migraines as well as new concern, dizziness.  ENT note reviewed.   UPDATE: Reports dizziness since the beginning of the year.  No preceding head injury or new medication.  Reports episodic dizziness described as a wave of lightheadedness or sensation of stepping off of a boat, but not spinning.  It occurs with any movement but also can occur spontaneously.  Typically lasts 15 seconds but occurs all day long.  It doesn't occur while laying in bed, such as turning over.  No headache but feels like her head is floating.  No visual disturbance.  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  It does not appear to correlate with a migraine flare.  She reports history of mild orthostatic hypotension when getting up too fast, but not experiencing that with this and reportedly orthostatic vitals were negative when checked by her PCP.  Flonase didn't help, however meclizine is effective.  She followed up with ENT.  Dix-Hallpike was negative.  VNG was  reportedly negative for peripheral etiology but note did not clarify if there was a potential central etiology or normal.  She has bilateral sensorineural hearing loss.  Hearing aids were recommended.  She was referred to vestibular rehab which has not yet star.   Rescue protocol:  For moderate - gabapentin 143m, for severe-Roselyn MeierCurrent NSAIDS/steroid: budesomide Current analgesics: None Current triptans: None Current ergotamine: None Current anti-emetic: Promethazine 12.5 mg Current muscle relaxants: None Current anti-anxiolytic: None Current sleep aide: None Current Antihypertensive medications: losartan-HCTZ Current Antidepressant medications: None Current Anticonvulsant medications: Gabapentin 3098mtwice daily Current anti-CGRP: Vyepti 10012mUbrelvy Current Vitamins/Herbal/Supplements: Riboflavin 400 mg daily, vitamin D Current Antihistamines/Decongestants: None Other therapy: Cefaly, ice pack Other medication: Meclizine   Caffeine: 1 cup of coffee daily Alcohol: None Smoker: No Diet: Follows [employer] ImmunoCalm Dietary Program.  Hydrates. Exercise: When tolerated (knee problems).  She walks and bikes. Depression: No; Anxiety: No Other pain:  no Sleep hygiene: Improved   HISTORY:  Onset: [age]ars old Location:  Varies (unilateral either side, frontal-temporal, back of head) Quality:  Varies (stabbing, pounding, throbbing) Initial Intensity:  10/10 severe, otherwise 5-7/10 Aura:  Occurs off and on over the years and varies in semiology.  In early 1970s, black out.  In late 1980s, white out.  In late 1990s, flashing lights and zigzag lines.  Since 2016, scintillating scotoma (occurs 1 to 2 times a month) Prodrome:  no Associated symptoms: Initially vomiting.  Sometimes nausea.  Photophobia, phonophobia.  Dizziness. Initial Duration:  All day Initial Frequency:  daily Triggers: Change in weather, emotional stress, valsalva maneuver, odors, certain foods Relieving factors:  ice, rest Activity:  Difficult to function if severe   Past NSAIDS:  diclofenac 46m, Cambia, Mobic 164m ibuprofen, naproxen, toradol Past analgesics:  tramadol (reaction), Midrin, Excedrin Past abortive triptans/ergots:  Treximet, Axert, Amerge, Frova, Maxalt, Relpax, Zomig tablet, DHE NS, sumatriptan 10033msumatriptan 6mg25m Past anti-emetic:  Zofran 4mg 72mt anxiolytic:  Buspirone, clonazepam Past antihypertensive medications:  Metoprolol, Norvasc, propranolol ER 120mg,62mnolol 100mg (49m effects dizziness, myalgias, acid reflux) Past antidepressant medications:  Venlafaxine, sertraline, amitriptyline, amoxapine, duloxetine, nortriptyline, imipramine, Luvox, Remeron, Serzone, bupropion, desipramine, doxepin, Vivactil Past anticonvulsant medications:  Depakote, Topamax, zonisamide, Lamictal, Keppra, Lyrica, gabapentin 300/300/600  Past anti--CGRP: Aimovig, Emgality Past vitamins/Herbal/Supplements:  butterbur, Mg, CoQ10 Other past treatments:  Botox, Cefaly Device, methylergonovine, acupuncture, biofeedback, Seroquel, Thorazine, methylsergide maleate   Family history of headache:  Maybe her mother's side.   Prior brain MRI normal (date unknown but report is mentioned in prior notes).   Abnormal Movements: In December 2017, we started atenolol for migraine prevention.  It worked well, but due to reported side effects of dizziness, acid reflux and joint and muscle pain, she was tapered off of it in early April.  Around that time, she developed shaking episodes or tremors associated with her migraines.  She followed up with her PCP, Dr. BedsoleDiona Browner4/18 and we had her discontinue the gabapentin.  She hasn't had any recurrent tremors, however she says she has gone up to 4 days at a time without an attack.  Since discontinuing the gabapentin, the intensity of her daily headaches have gotten worse.     I reviewed the video of her habitual attacks.  In the video, she exhibits flapping of her  right hand, arrhythmic and without tremor or choreiform movement.  It is not an arrhythmic jerking, consistent with myoclonus.  It sometimes involves the left hand as well.  She denies stress and anxiety.  PAST MEDICAL HISTORY: Past Medical History:  Diagnosis Date   Anemia    Arthritis    osteoarthritis B knees, left hip , right elbow   Clotting disorder (HCC)    Complication of anesthesia    difficult waking   Diabetes mellitus without complication (HCC)    Diet and exercise controlled   GERD (gastroesophageal reflux disease)    Headache    History of kidney stones    Hyperlipidemia    Hypertension    MVP (mitral valve prolapse)    history of   PONV (postoperative nausea and vomiting)     MEDICATIONS: Current Outpatient Medications on File Prior to Visit  Medication Sig Dispense Refill   atorvastatin (LIPITOR) 80 MG tablet TAKE 1 TABLET BY MOUTH DAILY. 90 tablet 3   Blood Glucose Monitoring Suppl (ONETOUCH VERIO REFLECT) w/Device KIT 2 (two) times daily. for testing     budesonide (ENTOCORT EC) 3 MG 24 hr capsule Take 2 capsules by mouth daily.     cholecalciferol (VITAMIN D) 1000 units tablet Take 1,000 Units by mouth daily.     colesevelam (WELCHOL) 625 MG tablet TAKE 3 TABLETS BY MOUTH 2 TIMES DAILY WITH A MEAL. 540 tablet 3   ELIQUIS 2.5 MG TABS tablet TAKE 1 TABLET BY MOUTH 2 TIMES DAILY. 180 tablet 0   Eptinezumab-jjmr (VYEPTI) 100 MG/ML injection Inject 300 mg into the vein.     gabapentin (NEURONTIN) 100 MG capsule Take 100 mg by mouth 3 (three) times daily as needed.     gabapentin (NEURONTIN) 300 MG capsule Take 2 capsules (600 mg total) by mouth at bedtime. 180 capsule  0   insulin glargine (LANTUS SOLOSTAR) 100 UNIT/ML Solostar Pen Inject 55 Units into the skin daily.     Lancets (ONETOUCH DELICA PLUS [id number]) MISC USE TO CHECK BLOOD SUGAR TWO TIMES DAILY 100 each 12   losartan-hydrochlorothiazide (HYZAAR) 50-12.5 MG tablet Take 1 tablet by mouth daily. 90 tablet 3    Meclizine HCl 25 MG CHEW Chew 25 mg by mouth every 8 (eight) hours as needed.     omeprazole (PRILOSEC) 20 MG capsule Take 20 mg by mouth 2 (two) times daily before a meal.     ONETOUCH VERIO test strip USE TO CHECK BLOOD SUGAR TWO TIMES A DAY 100 strip 12   potassium chloride SA (KLOR-CON) 20 MEQ tablet Take 1 tablet (20 mEq total) by mouth daily. 90 tablet 3   promethazine (PHENERGAN) 12.5 MG tablet Take 12.5 mg by mouth every 6 (six) hours as needed for nausea or vomiting.     UBRELVY 100 MG TABS TAKE 1 TABLET BY MOUTH AS NEEDED (MAY REPEAT DOSE AFTER 2 HOURS IF NEEDED. MAXIMUM 2 TABLETS IN 24 HOURS). 10 tablet 3   ULTICARE SHORT PEN NEEDLES 31G X 8 MM MISC USE TO INJECT INSULIN DAILY 100 each 2   No current facility-administered medications on file prior to visit.    ALLERGIES: Allergies  Allergen Reactions   Atenolol    Codeine     REACTION: Migraine   Ibuprofen    Penicillins Itching    Has patient had a PCN reaction causing immediate rash, facial/tongue/throat swelling, SOB or lightheadedness with hypotension: No Has patient had a PCN reaction causing severe rash involving mucus membranes or skin necrosis: No Has patient had a PCN reaction that required hospitalization: No Has patient had a PCN reaction occurring within the last 10 years: No If all of the above answers are "NO", then may proceed with Cephalosporin use.    Pentazocine Lactate     REACTION: Swelling, itching, rash   Propranolol Other (See Comments)   Crestor [Rosuvastatin] Other (See Comments)   Sudafed [Pseudoephedrine Hcl] Itching and Anxiety    FAMILY HISTORY: Family History  Problem Relation Age of Onset   Cancer Mother        bone   Hypertension Father    Mitral valve prolapse Father    Asthma Brother    Arthritis Brother    Nephrolithiasis Brother    Nephrolithiasis Brother    Asthma Brother    Arthritis Brother    Aortic aneurysm Brother        ascending aortic aneuysm   Breast cancer  Maternal Aunt    Breast cancer Maternal Aunt       Objective:  Blood pressure 135/75, pulse 90, height 5' (1.524 m), weight 162 lb (73.5 kg), SpO2 98 %. General: No acute distress.  Patient appears well-groomed.   Head:  Normocephalic/atraumatic Eyes:  Fundi examined but not visualized Neck: supple, no paraspinal tenderness, full range of motion Heart:  Regular rate and rhythm Lungs:  Clear to auscultation bilaterally Back: No paraspinal tenderness Neurological Exam: alert and oriented to person, place, and time. Speech fluent and not dysarthric, language intact.  CN II-XII intact. No nystagmus.  Bulk and tone normal, muscle strength 5/5 throughout.  Sensation to light touch intact.  Deep tendon reflexes 2+ throughout, toes downgoing.  Finger to nose and heel to shin testing intact.  Cautious unsteady gait.  Using a cane for balance., Romberg negative.     Metta Clines, DO  CC: Amy  Diona Browner, MD

## 2020-11-21 ENCOUNTER — Other Ambulatory Visit: Payer: Self-pay

## 2020-11-21 ENCOUNTER — Encounter: Payer: Self-pay | Admitting: Neurology

## 2020-11-21 ENCOUNTER — Ambulatory Visit (INDEPENDENT_AMBULATORY_CARE_PROVIDER_SITE_OTHER): Payer: Medicare Other | Admitting: Neurology

## 2020-11-21 VITALS — BP 135/75 | HR 90 | Ht 60.0 in | Wt 162.0 lb

## 2020-11-21 DIAGNOSIS — R270 Ataxia, unspecified: Secondary | ICD-10-CM | POA: Diagnosis not present

## 2020-11-21 DIAGNOSIS — R42 Dizziness and giddiness: Secondary | ICD-10-CM

## 2020-11-21 DIAGNOSIS — G450 Vertebro-basilar artery syndrome: Secondary | ICD-10-CM | POA: Diagnosis not present

## 2020-11-21 NOTE — Patient Instructions (Signed)
Will check MRI and MRA of brain Will request VNG report Further recommendations pending results Follow up in August as scheduled.

## 2020-12-15 ENCOUNTER — Telehealth: Payer: Self-pay

## 2020-12-15 NOTE — Progress Notes (Signed)
No PA needed

## 2020-12-15 NOTE — Progress Notes (Unsigned)
No PA needed for MRI

## 2020-12-15 NOTE — Telephone Encounter (Signed)
Pt called and informed that her MRI brain and the MR angio was scheduled for July for 19th she needs to be there at 2:30pm one test will be at 3pm the other will be at 4pm.

## 2020-12-18 ENCOUNTER — Encounter: Payer: Self-pay | Admitting: Family Medicine

## 2020-12-18 LAB — HM DIABETES EYE EXAM

## 2020-12-23 ENCOUNTER — Ambulatory Visit
Admission: RE | Admit: 2020-12-23 | Discharge: 2020-12-23 | Disposition: A | Discharge status: Home / Self Care | Payer: Medicare Other | Source: Ambulatory Visit | Attending: Neurology | Admitting: Neurology

## 2020-12-23 ENCOUNTER — Other Ambulatory Visit: Payer: Self-pay

## 2020-12-23 ENCOUNTER — Ambulatory Visit: Payer: Medicare Other

## 2020-12-23 DIAGNOSIS — G450 Vertebro-basilar artery syndrome: Secondary | ICD-10-CM | POA: Diagnosis present

## 2020-12-23 DIAGNOSIS — R270 Ataxia, unspecified: Secondary | ICD-10-CM | POA: Diagnosis present

## 2020-12-23 DIAGNOSIS — R42 Dizziness and giddiness: Secondary | ICD-10-CM | POA: Diagnosis present

## 2020-12-25 ENCOUNTER — Other Ambulatory Visit: Payer: Self-pay | Admitting: Family Medicine

## 2020-12-25 DIAGNOSIS — Z1231 Encounter for screening mammogram for malignant neoplasm of breast: Secondary | ICD-10-CM

## 2020-12-30 ENCOUNTER — Telehealth: Payer: Self-pay

## 2020-12-30 DIAGNOSIS — R42 Dizziness and giddiness: Secondary | ICD-10-CM

## 2020-12-30 DIAGNOSIS — H5500 Unspecified nystagmus: Secondary | ICD-10-CM

## 2020-12-30 NOTE — Telephone Encounter (Signed)
MRI and MRA ordered with new dx codes

## 2020-12-30 NOTE — Telephone Encounter (Signed)
-----   Message from Pieter Partridge, DO sent at 12/30/2020  3:21 PM EDT ----- MRA of head denied.  Vertebrobasilar insufficiency and ataxia not sufficient.  We can add vertigo and nystagmus.  If still not covered, then CTA of head.

## 2021-01-07 NOTE — Progress Notes (Signed)
NEUROLOGY FOLLOW UP OFFICE NOTE  Debra Leach [id number]  Assessment/Plan:   Chronic migraine without aura, without status migrainosus, not intractable - improved on 369m Vyepti Dizziness - Unclear etiology.  Does not appear to be cerebrovascular and her migraines have actually been improving with no prior history of associated dizziness. Elevated blood pressure - follow up with PCP  Migraine prevention:  Vyepti 3061mQ12wks Migraine rescue:  Ubrelvy 10038mgabapentin for less severe For dizziness, refer to vestibular rehab.  She would like to avoid adding new medications but if rehab ineffective, would consider SSRI or diazepam. Limit use of pain relievers to no more than 2 days out of week to prevent risk of rebound or medication-overuse headache. Keep headache diary Follow up 6 months.   Subjective:  Debra Leach a 69 10ar old right-handed female with autoimmune pancreatitis, migraines, hyperlipidemia, diabetes, GERD, osteoarthritis, and history of MVP and renal stones who follows up for migraines as well as new concern, dizziness.  ENT note reviewed.   UPDATE: Dizziness: Underwent workup for possible central dizziness.  MRI and MRA of brain on 12/23/2020 personally reviewed were unremarkable.  Last Thursday and Friday no dizziness.  However, it has otherwise been daily.  Still persistent with fluctuations throughout the day.  After lunch, she feels more fatigued and increase dizziness.  Not spinning per se but she feels like she is off-balance.  Some lightheadedness.     Migraines: Increased Vyepti to 300m15mery 12 weeks.  Significant improvement. In last 64 days, 8% increase of headache-free days.  5% increase in lower intensity headache days, 14% decrease in severe headache days.  Normally severe headaches last 4-6 hours.  Mild headaches last 2-4 hours.  Rescue protocol:  For moderate - gabapentin 100mg32mr severe- UbrRoselyn Meierent NSAIDS/steroid:  budesomide Current analgesics: None Current triptans: None Current ergotamine: None Current anti-emetic: Promethazine 12.5 mg Current muscle relaxants: None Current anti-anxiolytic: None Current sleep aide: None Current Antihypertensive medications: losartan-HCTZ Current Antidepressant medications: None Current Anticonvulsant medications: Gabapentin 300mg 33me daily Current anti-CGRP: Vyepti 100mg; 27mlvy Current Vitamins/Herbal/Supplements: Riboflavin 400 mg daily, vitamin D Current Antihistamines/Decongestants: meclizine Other therapy: Cefaly, ice pack Other medication: Meclizine   Caffeine: 1 cup of coffee daily Alcohol: None Smoker: No Diet: Follows [employer] ImmunoCalm Dietary Program.  Hydrates. Exercise: When tolerated (knee problems).  She walks and bikes. Depression: No; Anxiety: No Other pain:  no Sleep hygiene: Improved   HISTORY: Migraines: Onset: 19 year7old Location:  Varies (unilateral either side, frontal-temporal, back of head) Quality:  Varies (stabbing, pounding, throbbing) Initial Intensity:  10/10 severe, otherwise 5-7/10 Aura:  Occurs off and on over the years and varies in semiology.  In early 1970s, black out.  In late 1980s, white out.  In late 1990s, flashing lights and zigzag lines.  Since 2016, scintillating scotoma (occurs 1 to 2 times a month) Prodrome:  no Associated symptoms: Initially vomiting.  Sometimes nausea.  Photophobia, phonophobia.  Dizziness. Initial Duration:  All day Initial Frequency:  daily Triggers: Change in weather, emotional stress, valsalva maneuver, odors, certain foods Relieving factors: ice, rest Activity:  Difficult to function if severe   Past NSAIDS:  diclofenac 50mg, C51ma, Mobic 15mg, ib36mfen, naproxen, toradol Past analgesics:  tramadol (reaction), Midrin, Excedrin Past abortive triptans/ergots:  Treximet, Axert, Amerge, Frova, Maxalt, Relpax, Zomig tablet, DHE NS, sumatriptan 100mg; sum43mptan 6mg [place] Pas49manti-emetic:  Zofran 4mg Past an51mlytic:  Buspirone, clonazepam Past antihypertensive medications:  Metoprolol, Norvasc, propranolol ER 120mg, atenol66m00mg (side ef39ms dizziness,  myalgias, acid reflux) Past antidepressant medications:  Venlafaxine, sertraline, amitriptyline, amoxapine, duloxetine, nortriptyline, imipramine, Luvox, Remeron, Serzone, bupropion, desipramine, doxepin, Vivactil Past anticonvulsant medications:  Depakote, Topamax, zonisamide, Lamictal, Keppra, Lyrica, gabapentin 300/300/600  Past anti--CGRP: Aimovig, Emgality Past vitamins/Herbal/Supplements:  butterbur, Mg, CoQ10 Other past treatments:  Botox, Cefaly Device, methylergonovine, acupuncture, biofeedback, Seroquel, Thorazine, methylsergide maleate   Family history of headache:  Maybe her mother's side.   Prior brain MRI normal (date unknown but report is mentioned in prior notes).  DIzziness: Reports dizziness since the beginning of the year.  No preceding head injury or new medication.  Reports episodic dizziness described as a wave of lightheadedness or sensation of stepping off of a boat, but not spinning.  It occurs with any movement but also can occur spontaneously.  Typically lasts 15 seconds but occurs all day long.  It doesn't occur while laying in bed, such as turning over.  No headache but feels like her head is floating.  No visual disturbance.  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  It does not appear to correlate with a migraine flare.  She reports history of mild orthostatic hypotension when getting up too fast, but not experiencing that with this and reportedly orthostatic vitals were negative when checked by her PCP.  Flonase didn't help, however meclizine is effective.  She followed up with ENT.  Dix-Hallpike was negative.  VNG showed abnormal smooth pursuit, saccades and optokinetic testing - central pathology  cannot be ruled out.  She has bilateral sensorineural hearing loss.  Hearing aids were recommended.   Abnormal Movements: In December 2017, we started atenolol for migraine prevention.  It worked well, but due to reported side effects of dizziness, acid reflux and joint and muscle pain, she was tapered off of it in early April.  Around that time, she developed shaking episodes or tremors associated with her migraines.  She followed up with her PCP, Dr. Diona Browner, on 10/08/16 and we had her discontinue the gabapentin.  She hasn't had any recurrent tremors, however she says she has gone up to 4 days at a time without an attack.  Since discontinuing the gabapentin, the intensity of her daily headaches have gotten worse.     I reviewed the video of her habitual attacks.  In the video, she exhibits flapping of her right hand, arrhythmic and without tremor or choreiform movement.  It is not an arrhythmic jerking, consistent with myoclonus.  It sometimes involves the left hand as well.  She denies stress and anxiety.  PAST MEDICAL HISTORY: Past Medical History:  Diagnosis Date   Anemia    Arthritis    osteoarthritis B knees, left hip , right elbow   Clotting disorder (HCC)    Complication of anesthesia    difficult waking   Diabetes mellitus without complication (HCC)    Diet and exercise controlled   GERD (gastroesophageal reflux disease)    Headache    History of kidney stones    Hyperlipidemia    Hypertension    MVP (mitral valve prolapse)    history of   PONV (postoperative nausea and vomiting)     MEDICATIONS: Current Outpatient Medications on File Prior to Visit  Medication Sig Dispense Refill   atorvastatin (LIPITOR) 80 MG tablet TAKE 1 TABLET BY MOUTH DAILY. 90 tablet 3   Blood Glucose Monitoring Suppl (ONETOUCH VERIO REFLECT) w/Device KIT 2 (two) times daily. for testing  budesonide (ENTOCORT EC) 3 MG 24 hr capsule Take 2 capsules by mouth daily.     cholecalciferol (VITAMIN D)  1000 units tablet Take 1,000 Units by mouth daily.     colesevelam (WELCHOL) 625 MG tablet TAKE 3 TABLETS BY MOUTH 2 TIMES DAILY WITH A MEAL. 540 tablet 3   ELIQUIS 2.5 MG TABS tablet TAKE 1 TABLET BY MOUTH 2 TIMES DAILY. 180 tablet 0   Eptinezumab-jjmr (VYEPTI) 100 MG/ML injection Inject 300 mg into the vein.     gabapentin (NEURONTIN) 100 MG capsule Take 100 mg by mouth 3 (three) times daily as needed.     gabapentin (NEURONTIN) 300 MG capsule Take 2 capsules (600 mg total) by mouth at bedtime. 180 capsule 0   insulin glargine (LANTUS SOLOSTAR) 100 UNIT/ML Solostar Pen Inject 55 Units into the skin daily.     Lancets (ONETOUCH DELICA PLUS [id number]) MISC USE TO CHECK BLOOD SUGAR TWO TIMES DAILY 100 each 12   losartan-hydrochlorothiazide (HYZAAR) 50-12.5 MG tablet Take 1 tablet by mouth daily. 90 tablet 3   Meclizine HCl 25 MG CHEW Chew 25 mg by mouth every 8 (eight) hours as needed.     omeprazole (PRILOSEC) 20 MG capsule Take 20 mg by mouth 2 (two) times daily before a meal.     ONETOUCH VERIO test strip USE TO CHECK BLOOD SUGAR TWO TIMES A DAY 100 strip 12   potassium chloride SA (KLOR-CON) 20 MEQ tablet Take 1 tablet (20 mEq total) by mouth daily. 90 tablet 3   promethazine (PHENERGAN) 12.5 MG tablet Take 12.5 mg by mouth every 6 (six) hours as needed for nausea or vomiting.     UBRELVY 100 MG TABS TAKE 1 TABLET BY MOUTH AS NEEDED (MAY REPEAT DOSE AFTER 2 HOURS IF NEEDED. MAXIMUM 2 TABLETS IN 24 HOURS). 10 tablet 3   ULTICARE SHORT PEN NEEDLES 31G X 8 MM MISC USE TO INJECT INSULIN DAILY 100 each 2   vitamin B-12 (CYANOCOBALAMIN) 1000 MCG tablet Take 1 tablet by mouth daily.     No current facility-administered medications on file prior to visit.    ALLERGIES: Allergies  Allergen Reactions   Atenolol    Codeine     REACTION: Migraine   Ibuprofen    Penicillins Itching    Has patient had a PCN reaction causing immediate rash, facial/tongue/throat swelling, SOB or lightheadedness with  hypotension: No Has patient had a PCN reaction causing severe rash involving mucus membranes or skin necrosis: No Has patient had a PCN reaction that required hospitalization: No Has patient had a PCN reaction occurring within the last 10 years: No If all of the above answers are "NO", then may proceed with Cephalosporin use.    Pentazocine Lactate     REACTION: Swelling, itching, rash   Propranolol Other (See Comments)   Crestor [Rosuvastatin] Other (See Comments)   Sudafed [Pseudoephedrine Hcl] Itching and Anxiety    FAMILY HISTORY: Family History  Problem Relation Age of Onset   Cancer Mother        bone   Hypertension Father    Mitral valve prolapse Father    Asthma Brother    Arthritis Brother    Nephrolithiasis Brother    Nephrolithiasis Brother    Asthma Brother    Arthritis Brother    Aortic aneurysm Brother        ascending aortic aneuysm   Breast cancer Maternal Aunt    Breast cancer Maternal Aunt       Objective:  Blood pressure (!) 166/81, pulse 92, height 5' (1.524 m), weight 162 lb 6.4 oz (73.7 kg), SpO2 97 %. General: No acute distress.  Patient appears well-groomed.   Head:  Normocephalic/atraumatic Eyes:  Fundi examined but not visualized Neck: supple, no paraspinal tenderness, full range of motion Heart:  Regular rate and rhythm Lungs:  Clear to auscultation bilaterally Back: No paraspinal tenderness Neurological Exam: alert and oriented to person, place, and time.  Speech fluent and not dysarthric, language intact.  CN II-XII intact. Bulk and tone normal, muscle strength 5/5 throughout.  Sensation to light touch intact.  Deep tendon reflexes 2+ throughout, toes downgoing.  Finger to nose testing intact.  Gait normal, Romberg negative.   Metta Clines, DO  CC: Eliezer Lofts, MD

## 2021-01-08 ENCOUNTER — Encounter: Payer: Self-pay | Admitting: Neurology

## 2021-01-08 ENCOUNTER — Ambulatory Visit (INDEPENDENT_AMBULATORY_CARE_PROVIDER_SITE_OTHER): Payer: Medicare Other | Admitting: Neurology

## 2021-01-08 ENCOUNTER — Other Ambulatory Visit: Payer: Self-pay

## 2021-01-08 VITALS — BP 166/81 | HR 92 | Ht 60.0 in | Wt 162.4 lb

## 2021-01-08 DIAGNOSIS — R030 Elevated blood-pressure reading, without diagnosis of hypertension: Secondary | ICD-10-CM

## 2021-01-08 DIAGNOSIS — H8190 Unspecified disorder of vestibular function, unspecified ear: Secondary | ICD-10-CM

## 2021-01-08 DIAGNOSIS — G43709 Chronic migraine without aura, not intractable, without status migrainosus: Secondary | ICD-10-CM | POA: Diagnosis not present

## 2021-01-08 NOTE — Patient Instructions (Signed)
No change in migraine management Refer to vestibular rehabilitation - if not effective, consider starting an antidepressant or diazepam Follow up in 6 months

## 2021-01-14 ENCOUNTER — Other Ambulatory Visit: Payer: Self-pay | Admitting: Family Medicine

## 2021-01-21 ENCOUNTER — Other Ambulatory Visit: Payer: Self-pay

## 2021-01-21 ENCOUNTER — Ambulatory Visit: Payer: Medicare Other | Attending: Neurology | Admitting: Physical Therapy

## 2021-01-21 ENCOUNTER — Encounter: Payer: Self-pay | Admitting: Physical Therapy

## 2021-01-21 DIAGNOSIS — R42 Dizziness and giddiness: Secondary | ICD-10-CM | POA: Diagnosis present

## 2021-01-21 DIAGNOSIS — R2681 Unsteadiness on feet: Secondary | ICD-10-CM | POA: Diagnosis present

## 2021-01-21 NOTE — Therapy (Signed)
[place] MAIN [facility] SERVICES [place], [place], [address] Phone: [phone]   Fax:  [phone]  Physical Therapy Evaluation  Patient Details  Name: Debra Leach MRN: [id number] Date of Birth: 06-05-1951 Referring Provider (PT): Dr. Tomi Likens  Encounter Date: 01/21/2021   PT End of Session - 01/21/21 1257     Visit Number 1    Number of Visits 12    Date for PT Re-Evaluation 04/15/21    PT Start Time [id number]    PT Stop Time 1410    PT Time Calculation (min) 72 min    Equipment Utilized During Treatment Gait belt    Activity Tolerance Patient tolerated treatment well    Behavior During Therapy WFL for tasks assessed/performed             Past Medical History:  Diagnosis Date   Anemia    Arthritis    osteoarthritis B knees, left hip , right elbow   Clotting disorder (HCC)    Complication of anesthesia    difficult waking   Diabetes mellitus without complication (HCC)    Diet and exercise controlled   GERD (gastroesophageal reflux disease)    Headache    History of kidney stones    Hyperlipidemia    Hypertension    MVP (mitral valve prolapse)    history of   PONV (postoperative nausea and vomiting)     Past Surgical History:  Procedure Laterality Date   arm surgery     BREAST BIOPSY Left    BREAST SURGERY  2000   breast biopsy (benign)   LITHOTRIPSY  08-12-2008   stent placed bilaterally   PARTIAL HYSTERECTOMY     Both ovaries remain, vaginal, for [place]    There were no vitals filed for this visit.    Subjective Assessment - 01/21/21 1526     Subjective Patient states that she has multiple episodes of dizziness each day that lasts minutes to a few hours. Patient reports that quick turns, head turns, bending over and looking up, tilting her head back to wash her hair all bring on her dizziness symptoms.    Pertinent History Pt reports that she began  to have episodes of dizziness in January 2022. Pt describes her dizziness as a rocking sensation like when getting off a boat and feels like she gets waves of dizziness where she feels like she is moving. Pt describes her dizziness as vertigo, unsteadiness, lightheadedness. Pt's symptoms are motion provoked and intermittent in nature. Pt reports that she is getting multiple daily episodes of dizziness that comes in waves and can last a few minutes to several hours. Pt reports that head turns, body turns, bending over, looking back, quick movements all exacerbate her symptoms.Pt reports that she does not do quick movements in order to decrease her symptoms. Pt reports showering and washing her hair will also bring on her dizziness symptoms. Pt reports that going to sleep at night helps to decrease her symptoms. Pt reports that she does not get dizziness when she is lying in bed. Pt reports that her symptoms of dizziness are worse later in the day. Pt reports that she has seen Dr. Tami Ribas, ENT physician, in regards to her dizziness symptoms and reports that she had VNG testing performed. Pt reports the Dix-Hallpike testing was negative and that the VNG indicated potential central findings. Pt was referred to Dr. Tomi Likens, neurologist, for  further follow-up and was prescribed Meclizine. Pt reports that she began to use a single-point cane in June upon recommendation by Dr. Tami Ribas due to patient's balance deficits. Pt reports that she has had daily episodes of migraines since the age of 57. Pt states that she used to get migraine pain levels of 8-9/10 and reports that now her pain levels are 5/10 since starting on the newer migraine medications in 2018. Pt reports that most days she does have migraines but reports that they are significantly decreased in intensity, duration and frequency since starting the new medications. Pt states she has had 14 migraine free days during this last month and that the migraines were  shorter in duration lasting a few hours and less severity. Pt reports that after lunch is when she is more prone to getting migraines and states that she tires more easily after lunch.  Pt reports that the dizziness symptoms do not coincide with her migraines. Pt states that in September 2020 she had 3 blood clots in her lungs and 3 blood clots in her leg.  Pt states that she used to walk 4 to 5 miles a day and was biking 30 minutes on multiple days a week.    Limitations Walking    Diagnostic tests MRA Head and Brain MRI: 1. No evidence of acute intracranial abnormality. Specifically, no  acute infarct.  2. No large vessel occlusion or proximal hemodynamically significant  stenosis.    Patient Stated Goals to have decreased dizziness and improved balance    Currently in Pain? --   none stated               [facility] PT Assessment - 01/21/21 1342       Assessment   Medical Diagnosis Vestibulopathy unspecified laterality    Referring Provider (PT) Dr. Tomi Likens    Onset Date/Surgical Date --   January 2022   Prior Therapy No prior vestibular therapy      Precautions   Precautions Fall      Restrictions   Weight Bearing Restrictions No      Balance Screen   Has the patient fallen in the past 6 months Yes    How many times? 1   Mechanical fall tripped   Has the patient had a decrease in activity level because of a fear of falling?  Yes    Is the patient reluctant to leave their home because of a fear of falling?  No      Home Ecologist residence    Living Arrangements Spouse/significant other    Available Help at Discharge Family    Type of [place] to enter    Entrance Stairs-Number of Steps 6 steps with left rail to enter from the garage primary access point    [place] Two level    Alternate Level Stairs-Number of Steps 1 flight with left rail      Prior Function   Level of Independence Independent with community mobility  with device    Vocation Retired    Leisure Engineer, petroleum   Overall Cognitive Status Within [employer] for tasks assessed      Standardized Balance Assessment   Standardized Balance Assessment Dynamic Gait Index      Dynamic Gait Index   Level Surface Mild Impairment    Change in Gait Speed Moderate Impairment    Gait with Horizontal Head Turns  Moderate Impairment    Gait with Vertical Head Turns Moderate Impairment    Gait and Pivot Turn Mild Impairment    Step Over Obstacle Moderate Impairment    Step Around Obstacles Mild Impairment    Steps Moderate Impairment    Total Score 11             VESTIBULAR AND BALANCE EVALUATION   HISTORY:  Subjective history of current problem: History obtained from patient report and medical record.  Patient reports that she began to have episodes of dizziness in January 2022.  Patient describes her dizziness as a rocking sensation like when getting off a boat and feels like she gets waves of dizziness where she feels like she is moving.  Patient describes her dizziness as vertigo, unsteadiness, lightheadedness.  Patient's symptoms are motion provoked and intermittent in nature.  Patient reports that she is getting multiple daily episodes of dizziness that comes in waves and can last a few minutes to several hours.  Patient reports that head turns, body turns, bending over, looking back, quick movements all exacerbate her symptoms.  Patient reports that she does not do quick movements in order to decrease her symptoms.  Patient reports showering and washing her hair will also bring on her dizziness symptoms.  Patient reports that going to sleep at night helps to decrease her symptoms.  Patient reports that she does not get dizziness when she is lying in bed.  Patient reports that her symptoms of dizziness are worse later in the day.  Patient reports that she has seen Dr. Tami Ribas, ENT physician, in regards to her dizziness symptoms and  reports that she had VNG testing performed.  Patient reports the Dix-Hallpike testing was negative and that the VNG indicated potential central findings.  Patient was referred to Dr. Tomi Likens, neurologist, for further follow-up and was prescribed Meclizine.  Patient reports that she began to use a single-point cane in June upon recommendation by Dr. Tami Ribas due to patient's balance deficits.  Patient reports that she has had daily episodes of migraines since the age of 41.  Patient states that she used to get migraine pain levels of 8-9/10 and reports that now her pain levels are 5/10 since starting on the newer migraine medications in 2018.  Patient reports that most days she does have migraines but reports that they are significantly decreased in intensity, duration and frequency since starting the new medications.  Patient states she has had 14 migraine free days during this last month and that the migraines were shorter in duration lasting a few hours and less severity.  Patient reports that prior to starting the new medications in 2018 she would experience loss of vision, nausea and vomiting with her migraines.  Patient reports that after lunch is when she is more prone to getting migraines and states that she tires more easily after lunch.  Patient reports that the dizziness symptoms do not coincide with her migraines.  Patient states that in September 2020 she had 3 blood clots in her lungs and 3 blood clots in her leg.  Patient reports that she is on medications for treatment of blood clots.  Patient states that she used to walk 4 to 5 miles a day and was biking 30 minutes on multiple days a week.  Patient states that since 2020, she has shortness of breath chronically now and is not able to tolerate those biking and walking activities.  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 MRA Head and Brain MRI IMPRESSION: 1. No evidence of acute intracranial abnormality. Specifically, no acute infarct. 2. No large vessel occlusion or proximal hemodynamically significant stenosis.   Progression of symptoms: Dizziness symptoms-worse and Migraine symptoms-better History of similar episodes: states she had vertigo one time 31 years ago. Patient states the vertigo was due to an inner ear infection and that it resolved once the infection was treated.    Falls (yes/no): yes 1 Number of falls in past 6 months: patient had a mechanical fall when her husband had changed the steps to the backyard.   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 Vision (last eye exam, diplopia, recent changes): pt wears glasses. Patient reports she had eye exam 4 weeks.   Current Symptoms: (dysarthria, dysphagia, drop attacks, bowel and bladder changes, recent weight loss/gain) patient reports that she began to have bloody bowel movements and saw her gastroenterologist and had a colonoscopy performed which found fissures.  Patient will continue to follow-up with her gastroenterologist.  Patient reports she has a history of difficulty swallowing and has had procedures for esophageal stretching in the past.  Review of systems otherwise negative for red flags.  EXAMINATION   SOMATOSENSORY:  Any N & T in extremities or weakness: Denies      COORDINATION: Finger to Nose:  Normal Past Pointing:   Normal  MUSCULOSKELETAL SCREEN: Cervical Spine ROM: Not formally assessed but appeared to be within functional limits during session.   Functional Mobility: Sit to stand with contact-guard assist and stand to sit with supervision.  Gait: Patient arrives ambulating without single-point cane. Patient ambulates with acutely decreased gait speed with decreased step length and decreased step  height with evidence of imbalance.  Patient holding arms up and out to the side at waist level during challenging ambulation activities due to imbalance. Scanning of visual environment with gait is: Fair  Balance: Patient is challenged by single-leg stance, narrow base of support, uneven surfaces, and ambulation with head turns and body turns activities.  POSTURAL CONTROL TESTS:  Clinical Test of Sensory Interaction for Balance (CTSIB): Deferred will test next session.  OCULOMOTOR / VESTIBULAR TESTING: Oculomotor Exam- Room Light  Normal Abnormal Comments  Ocular Alignment N    Ocular ROM N    Spontaneous Nystagmus N    Gaze evoked Nystagmus N    Smooth Pursuit  Abn Multiple saccadic movements noted  Saccades  Abn Mild hypometric saccades noted all fields  VOR  Abnormal Blurring of target and reproduction of dizziness symptoms  VOR Cancellation  Abn Blurring of target and reproduction of dizziness symptoms  Left Head Impulse   Deferred  Right Head Impulse   Deferred   BPPV TESTS: Deferred; patient reports negative Dix-Hallpike tests at ENT office  FUNCTIONAL OUTCOME MEASURES:  Results Comments  DHI 56/100 Moderate perception of handicap; in need of intervention  ABC Scale 32.5% High falls risk; in need of intervention  DGI 11/24 Falls risk; in need of intervention  FOTO 45/100 Given the patient's risk adjustment variables, like-patients nationally had a FS score of 44/100 at intake  Dizziness functional status (DFS) 32.1 Scale 27-67; in need of intervention  10 meter Walking Speed M/sec Average as compared to age and gender normative values   VOR X 1 exercise:  Demonstrated and educated  as to VOR X1.  Patient performed VOR X 1 horizontal in sitting 3 reps of 30 seconds each with verbal cues for technique.  Patient performs VOR at decrease head turning speed. patient reports the target is blurring at faster speeds and reports 6/10 dizziness.  Issued VOR x1 horizontal in sitting  with plain background 30 second reps for home exercise program.  Access Code: [email or web address] URL: [email or web address] Date: 01/21/2021 Prepared by: Lady Deutscher  Exercises Seated VOR X1 exercise - 3 x daily - 7 x weekly - 1 sets - 3 reps - 30 second hold    PT Education - 01/21/21 1256     Education Details Discussed plan of care and goals; issued VOR x1 in sitting 30 second reps for home exercise program via medbridge    Person(s) Educated Patient    Methods Explanation;Demonstration;Handout;Verbal cues    Comprehension Verbalized understanding;Returned demonstration              PT Short Term Goals - 01/21/21 1449       PT SHORT TERM GOAL #1   Title Pt will be independent with HEP in order to improve balance and decrease dizziness symptoms in order to decrease fall risk and improve function at home and in the community    Time 4    Period Weeks    Status New    Target Date 02/18/21               PT Long Term Goals - 01/21/21 1450       PT LONG TERM GOAL #1   Title Patient will have improved FOTO score of 6 points or greater in order to demonstrate improvements in patient's ADLs and functional performance.    Baseline Scored 45/100 on 01/21/2021;    Time 12    Period Weeks    Status New    Target Date 04/15/21      PT LONG TERM GOAL #2   Title Patient will demonstrate reduced falls risk as evidenced by Dynamic Gait Index (DGI) >19/24.    Baseline Scored 11/24 on 04/15/2021;    Time 12    Period Weeks    Status New    Target Date 04/15/21      PT LONG TERM GOAL #3   Title Patient will reduce falls risk as indicated by Activities Specific Balance Confidence Scale (ABC) >67%.    Baseline Scored 32.5% on 01/21/2021;    Time 12    Period Weeks    Status New    Target Date 04/15/21      PT LONG TERM GOAL #4   Title Patient will reduce perceived disability to low levels as indicated by <40 on Dizziness Handicap Inventory.    Baseline Scored  56/100 moderate perception of handicap on 01/21/2021;    Time 12    Period Weeks    Status New    Target Date 04/15/21                    Plan - 01/21/21 1446     Clinical Impression Statement Patient presents with reports of dizziness symptoms beginning January 2022.  Per medical record and patient report patient had VNG testing at ENT physician's office which was negative for BPPV and indicated potential central findings.  With vestibular ocular exam, noted potential central findings as indicated by abnormal smooth pursuits, saccades, VOR, and VOR cancellation.  Patient scored 32% on ABC scale indicating high falls risk and 11/24 on the  dynamic gait index.  Patient scored moderate perception of handicap 45/100 on the dizziness handicap inventory and 45/100 on the FOTO score indicating decreased functional activity levels.  Patient would benefit from skilled vestibular PT services in order to address functional deficits and goals as set on plan of care and in order to try to decrease patient's falls risk.    Personal Factors and Comorbidities Comorbidity 3+;Time since onset of injury/illness/exacerbation    Comorbidities Migraines, hypertension, type 2 diabetes, bilateral eustachian tube disorder    Examination-Activity Limitations [place];Bathing;Locomotion Level;Stairs;Transfers;Stand    Examination-Participation Restrictions Driving;Community Activity    Stability/Clinical Decision Making Evolving/Moderate complexity    Clinical Decision Making Moderate    Rehab Potential Good    PT Frequency 1x / week    PT Duration 12 weeks    PT Treatment/Interventions Canalith Repostioning;Balance training;Therapeutic exercise;Therapeutic activities;Stair training;Gait training;Functional mobility training;Neuromuscular re-education;Patient/family education;Vestibular    PT Next Visit Plan Review VOR x1, together and semitandem progressions with head turns on firm surface and body wall rolls    PT  Home Exercise Plan Access Code: [email or web address]  URL: [email or web address]  Date: 01/21/2021  Prepared by: Lady Deutscher    Exercises  Seated VOR X1 exercise - 3 x daily - 7 x weekly - 1 sets - 3 reps - 30 second hold    Consulted and Agree with Plan of Care Patient             Patient will benefit from skilled therapeutic intervention in order to improve the following deficits and impairments:  Decreased endurance, Decreased strength, Decreased balance, Difficulty walking, Decreased mobility, Dizziness  Visit Diagnosis: Dizziness and giddiness  Unsteadiness on feet     Problem List Patient Active Problem List   Diagnosis Date Noted   Nausea 08/15/2020   Thyroid nodule 04/11/2020   Right leg pain 04/11/2020   Leg weakness, bilateral 10/30/2019   Situational anxiety 10/12/2019   AK (actinic keratosis) 09/26/2019   Dyspnea on minimal exertion 07/13/2019   Stuttering 07/13/2019   Hypertension associated with diabetes ([place]) 07/06/2019   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[place]) 07/06/2019   Multiple thyroid nodules 03/27/2019   DVT, lower extremity, proximal, acute, right ([place]) 03/13/2019   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[place]) 03/01/2019   Leukocytosis 03/01/2019   Breast nodule 03/01/2019   Abnormal renal finding 06/29/2018   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   Facial rash 03/01/2017   Eustachian tube disorder, bilateral 12/16/2016   Lightheadedness 12/16/2016   Esophageal dysphagia 11/22/2016   Chronic low back pain 10/07/2016   Vision changes 08/12/2016   Aortic atherosclerosis ([place]) 06/11/2016   Supraclavicular fossa fullness 06/08/2016   Family history of aortic aneurysm 08/20/2014   Vitamin D deficiency 08/20/2014   Type 2 diabetes mellitus with hyperglycemia ([place]) 01/08/2011   Osteopenia 11/06/2009   Hyperlipidemia associated with type 2 diabetes mellitus ([place]) 07/05/2008   INSOMNIA, CHRONIC 07/05/2008   Migraine with aura  07/05/2008   GERD 07/05/2008   OSTEOARTHRITIS 07/05/2008   MITRAL VALVE PROLAPSE, HX OF 07/05/2008   NEPHROLITHIASIS, HX OF 07/05/2008   Lady Deutscher PT, DPT [phone] Lady Deutscher 01/21/2021, 3:37 PM  [place] [facility] MAIN [facility] SERVICES [address] [place], [place], [address] Phone: [phone]   Fax:  [phone]  Name: Debra Leach MRN: [id number] Date of Birth: January 10, 1951

## 2021-01-26 ENCOUNTER — Encounter: Payer: Self-pay | Admitting: Physical Therapy

## 2021-01-26 ENCOUNTER — Ambulatory Visit: Payer: Medicare Other | Admitting: Physical Therapy

## 2021-01-26 ENCOUNTER — Other Ambulatory Visit: Payer: Self-pay

## 2021-01-26 DIAGNOSIS — R42 Dizziness and giddiness: Secondary | ICD-10-CM

## 2021-01-26 DIAGNOSIS — R2681 Unsteadiness on feet: Secondary | ICD-10-CM

## 2021-01-26 NOTE — Therapy (Signed)
[place] MAIN [facility] SERVICES [place], [place], [address] Phone: [phone]   Fax:  [phone]  Physical Therapy Treatment  Patient Details  Name: Debra Leach MRN: [id number] Date of Birth: [birth year] Referring Provider (PT): Dr. Tomi Likens   Encounter Date: 01/26/2021   PT End of Session - 01/26/21 0927     Visit Number 2    Number of Visits 12    Date for PT Re-Evaluation 04/15/21    PT Start Time 0927    PT Stop Time 1014    PT Time Calculation (min) 47 min    Equipment Utilized During Treatment Gait belt    Activity Tolerance Patient tolerated treatment well    Behavior During Therapy WFL for tasks assessed/performed             Past Medical History:  Diagnosis Date   Anemia    Arthritis    osteoarthritis B knees, left hip , right elbow   Clotting disorder (HCC)    Complication of anesthesia    difficult waking   Diabetes mellitus without complication (HCC)    Diet and exercise controlled   GERD (gastroesophageal reflux disease)    Headache    History of kidney stones    Hyperlipidemia    Hypertension    MVP (mitral valve prolapse)    history of   PONV (postoperative nausea and vomiting)     Past Surgical History:  Procedure Laterality Date   arm surgery     BREAST BIOPSY Left    BREAST SURGERY  2000   breast biopsy (benign)   LITHOTRIPSY  08-12-2008   stent placed bilaterally   PARTIAL HYSTERECTOMY     Both ovaries remain, vaginal, for [place]    There were no vitals filed for this visit.   Subjective Assessment - 01/26/21 0927     Subjective Patient states that she has been doing her VOR X 1 exercise every day as prescribed. Pt states that this exercise does reproduce her dizziness. Patient states she cannot go faster because the target starts to get jumpy.    Pertinent History Pt reports that she began to have episodes of dizziness in  January 2022. Pt describes her dizziness as a rocking sensation like when getting off a boat and feels like she gets waves of dizziness where she feels like she is moving. Pt describes her dizziness as vertigo, unsteadiness, lightheadedness. Pt's symptoms are motion provoked and intermittent in nature. Pt reports that she is getting multiple daily episodes of dizziness that comes in waves and can last a few minutes to several hours. Pt reports that head turns, body turns, bending over, looking back, quick movements all exacerbate her symptoms.Pt reports that she does not do quick movements in order to decrease her symptoms. Pt reports showering and washing her hair will also bring on her dizziness symptoms. Pt reports that going to sleep at night helps to decrease her symptoms. Pt reports that she does not get dizziness when she is lying in bed. Pt reports that her symptoms of dizziness are worse later in the day. Pt reports that she has seen Dr. Tami Ribas, ENT physician, in regards to her dizziness symptoms and reports that she had VNG testing performed. Pt reports the Dix-Hallpike testing was negative and that the VNG indicated potential central findings. Pt was referred to Dr. Tomi Likens, neurologist, for further follow-up and was prescribed Meclizine.  Pt reports that she began to use a single-point cane in June upon recommendation by Dr. Tami Ribas due to patient's balance deficits. Pt reports that she has had daily episodes of migraines since the age of 83. Pt states that she used to get migraine pain levels of 8-9/10 and reports that now her pain levels are 5/10 since starting on the newer migraine medications in 2018. Pt reports that most days she does have migraines but reports that they are significantly decreased in intensity, duration and frequency since starting the new medications. Pt states she has had 14 migraine free days during this last month and that the migraines were shorter in duration lasting a few  hours and less severity. Pt reports that after lunch is when she is more prone to getting migraines and states that she tires more easily after lunch.  Pt reports that the dizziness symptoms do not coincide with her migraines. Pt states that in September 2020 she had 3 blood clots in her lungs and 3 blood clots in her leg.  Pt states that she used to walk 4 to 5 miles a day and was biking 30 minutes on multiple days a week.    Limitations Walking    Diagnostic tests MRA Head and Brain MRI: 1. No evidence of acute intracranial abnormality. Specifically, no  acute infarct.  2. No large vessel occlusion or proximal hemodynamically significant  stenosis.    Patient Stated Goals to have decreased dizziness and improved balance            Neuromuscular Re-education:  VOR X 1 exercise:  Patient performed VOR X 1 horizontal in standing 2 reps of 1 minute each with verbal cues for head turning speed.  Patient maintaining eye contact on the target and demonstrating good amount of head movement. Added progression of 45 to 60 seconds for home exercise program.   Clinical Test of Sensory Interaction for Balance (CTSIB):  CONDITION TIME STRATEGY SWAY  Eyes open, firm surface 30 seconds ankle +1  Eyes closed, firm surface 30 seconds ankle +2  Eyes open, foam surface 30 seconds ankle +2  Eyes closed, foam surface 30 seconds Ankle, hip +3    Airex pad:  On firm surface and then on Airex pad, patient performed feet together progressions to feet together and semi-tandem progressions with alternating lead leg with and without body turns and horizontal and vertical head turns with contact-guard assist.  Patient demonstrating ankle and hip strategies. Patient reports unsteadiness and imbalance sensation. Discussed standing in corner with chair in front for safety with home exercise program and demonstrated.  Patient verbalized understanding.  Ambulation with head turns:  Patient performed 175' forwards  and retro ambulation with single-point cane while scanning for targets in hallway with contact-guard assistance.  Patient with decreased cadence with retroambulation. Patient reports 1/10 dizziness with forward ambulation and 3/10 with retroambulation.  Reviewed home exercise program and added feet together and semitandem progressions with horizontal and vertical head turns via med bridge. Access Code: [email or web address] URL: [email or web address] Date: 01/26/2021 Prepared by: Lady Deutscher  Exercises Seated VOR X1 exercise - 3 x daily - 7 x weekly - 1 sets - 3 reps - 45 second hold Feet together with horizontal head turns - 1 x daily - 7 x weekly - 1 sets - 5 reps - 1 minute hold Feet together with vertical head turns - 1 x daily - 7 x weekly - 1 sets - 5 reps - 1 minute hold Tandem stance with  horizontal head turns - 1 x daily - 7 x weekly - 1 sets - 5 reps - 1 minute hold Tandem Stance with Head Nods - 1 x daily - 7 x weekly - 1 sets - 5 reps - 1 minute hold    PT Education - 01/26/21 1222     Education Details reviewed VOR X 1 and progressed to 45-60 second reps; added feet together and semi-tandem progressions with horizontal and vertical head turns via [place].    Person(s) Educated Patient    Methods Explanation;Handout;Demonstration;Verbal cues    Comprehension Verbalized understanding;Returned demonstration              PT Short Term Goals - 01/21/21 1449       PT SHORT TERM GOAL #1   Title Pt will be independent with HEP in order to improve balance and decrease dizziness symptoms in order to decrease fall risk and improve function at home and in the community    Time 4    Period Weeks    Status New    Target Date 02/18/21               PT Long Term Goals - 01/21/21 1450       PT LONG TERM GOAL #1   Title Patient will have improved FOTO score of 6 points or greater in order to demonstrate improvements in patient's ADLs and functional performance.     Baseline Scored 45/100 on 01/21/2021;    Time 12    Period Weeks    Status New    Target Date 04/15/21      PT LONG TERM GOAL #2   Title Patient will demonstrate reduced falls risk as evidenced by Dynamic Gait Index (DGI) >19/24.    Baseline Scored 11/24 on 04/15/2021;    Time 12    Period Weeks    Status New    Target Date 04/15/21      PT LONG TERM GOAL #3   Title Patient will reduce falls risk as indicated by Activities Specific Balance Confidence Scale (ABC) >67%.    Baseline Scored 32.5% on 01/21/2021;    Time 12    Period Weeks    Status New    Target Date 04/15/21      PT LONG TERM GOAL #4   Title Patient will reduce perceived disability to low levels as indicated by <40 on Dizziness Handicap Inventory.    Baseline Scored 56/100 moderate perception of handicap on 01/21/2021;    Time 12    Period Weeks    Status New    Target Date 04/15/21                   Plan - 01/26/21 0954     Clinical Impression Statement Patient reports good compliance with home exercise program.  Patient able to progress to 45-60 seconds reps with VOR x1 exercise. Patient challenged by feet together and semitandem progressions with head turns and body turns this date and added to home exercise program.  Will plan on trying conflicting background with VOR x1, review feet together and semitandem progressions with head turns and body turns on Airex pad and hallway ball toss activities next session.  Patient with pending from continued vestibular PT services to try to further address deficits and to try to reduce patient's falls risk.    Personal Factors and Comorbidities Comorbidity 3+;Time since onset of injury/illness/exacerbation    Comorbidities Migraines, hypertension, type 2 diabetes, bilateral eustachian tube  disorder    Examination-Activity Limitations Bend;Bathing;Locomotion Level;Stairs;Transfers;Stand    Examination-Participation Restrictions Driving;Community Activity     Stability/Clinical Decision Making Evolving/Moderate complexity    Rehab Potential Good    PT Frequency 1x / week    PT Duration 12 weeks    PT Treatment/Interventions [place] Repostioning;Balance training;Therapeutic exercise;Therapeutic activities;Stair training;Gait training;Functional mobility training;Neuromuscular re-education;Patient/family education;Vestibular    PT Next Visit Plan Review VOR x1, together and semitandem progressions with head turns on firm surface and body wall rolls    PT Home Exercise Plan Access Code: [email or web address]  URL: [email or web address]  Date: 01/21/2021  Prepared by: Lady Deutscher    Exercises  Seated VOR X1 exercise - 3 x daily - 7 x weekly - 1 sets - 3 reps - 30 second hold    Consulted and Agree with Plan of Care Patient             Patient will benefit from skilled therapeutic intervention in order to improve the following deficits and impairments:  Decreased endurance, Decreased strength, Decreased balance, Difficulty walking, Decreased mobility, Dizziness  Visit Diagnosis: Dizziness and giddiness  Unsteadiness on feet     Problem List Patient Active Problem List   Diagnosis Date Noted   Nausea 08/15/2020   Thyroid nodule 04/11/2020   Right leg pain 04/11/2020   Leg weakness, bilateral 10/30/2019   Situational anxiety 10/12/2019   AK (actinic keratosis) 09/26/2019   Dyspnea on minimal exertion 07/13/2019   Stuttering 07/13/2019   Hypertension associated with diabetes ([place]) 07/06/2019   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[place]) 07/06/2019   Multiple thyroid nodules 03/27/2019   DVT, lower extremity, proximal, acute, right ([place]) 03/13/2019   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[place]) 03/01/2019   Leukocytosis 03/01/2019   Breast nodule 03/01/2019   Abnormal renal finding 06/29/2018   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) 06/11/2016   Supraclavicular fossa fullness 06/08/2016   Family history of aortic aneurysm 08/20/2014   Vitamin D deficiency 08/20/2014   Type 2 diabetes mellitus with hyperglycemia ([place]) 01/08/2011   Osteopenia 11/06/2009   Hyperlipidemia associated with type 2 diabetes mellitus ([place]) 07/05/2008   INSOMNIA, CHRONIC 07/05/2008   Migraine with aura 07/05/2008   GERD 07/05/2008   OSTEOARTHRITIS 07/05/2008   MITRAL VALVE PROLAPSE, HX OF 07/05/2008   NEPHROLITHIASIS, HX OF 07/05/2008   Lady Deutscher PT, DPT [phone] Lady Deutscher 01/26/2021, 12:40 PM  [facility] MAIN [facility] SERVICES [address] [place], [place], [address] Phone: [phone]   Fax:  [phone]  Name: Sadiqa Twitty MRN: [id number] Date of Birth: 24-Dec-1950

## 2021-01-27 ENCOUNTER — Encounter: Payer: Self-pay | Admitting: Family Medicine

## 2021-01-27 ENCOUNTER — Ambulatory Visit (INDEPENDENT_AMBULATORY_CARE_PROVIDER_SITE_OTHER): Payer: Medicare Other | Admitting: Family Medicine

## 2021-01-27 ENCOUNTER — Encounter: Payer: Medicare Other | Admitting: Physical Therapy

## 2021-01-27 VITALS — BP 130/76 | HR 89 | Temp 97.7°F | Ht 60.0 in | Wt 162.5 lb

## 2021-01-27 DIAGNOSIS — I152 Hypertension secondary to endocrine disorders: Secondary | ICD-10-CM | POA: Diagnosis not present

## 2021-01-27 DIAGNOSIS — E1159 Type 2 diabetes mellitus with other circulatory complications: Secondary | ICD-10-CM

## 2021-01-27 DIAGNOSIS — E1165 Type 2 diabetes mellitus with hyperglycemia: Secondary | ICD-10-CM | POA: Diagnosis not present

## 2021-01-27 DIAGNOSIS — R29898 Other symptoms and signs involving the musculoskeletal system: Secondary | ICD-10-CM

## 2021-01-27 LAB — POCT GLYCOSYLATED HEMOGLOBIN (HGB A1C): Hemoglobin A1C: 8.4 % — AB (ref 4.0–5.6)

## 2021-01-27 NOTE — Assessment & Plan Note (Signed)
Chronic , improving control in last 3 months.   On continued budesonide.   Work on increased activity. Lantus 55 Units daily.

## 2021-01-27 NOTE — Progress Notes (Signed)
Patient ID: Debra Leach, female    DOB: 08/03/1950, 70 y.o.   MRN: [id number]  This visit was conducted in person.  BP 130/76   Pulse 89   Temp 97.7 F (36.5 C) (Temporal)   Ht 5' (1.524 m)   Wt 162 lb 8 oz (73.7 kg)   SpO2 98%   BMI 31.74 kg/m    CC: Chief Complaint  Patient presents with   Diabetes    Subjective:   HPI: Debra Leach is a 70 y.o. female presenting on 01/27/2021 for Diabetes    She is currently in vestibular rehab for vertigo.   Still with some nausea from the autoimmune pancreatitis, but not unmanageable. Wt Readings from Last 3 Encounters:  01/27/21 162 lb 8 oz (73.7 kg)  01/08/21 162 lb 6.4 oz (73.7 kg)  11/21/20 162 lb (73.5 kg)     Diabetes:   Improving control, but not yet at goal.. A1C in office today 8.4 On budesonide for autoimmune pancreatitis Off metformin ( cause GI distress)  On Lantus 55 units daily Using medications without difficulties: Hypoglycemic episodes: none 68-76 Hyperglycemic episodes:  none > 200,  occ 193, 186 Feet problems: no ulcer Blood Sugars averaging: FBS 85-132 eye exam within last year: yes   At goal on losartan/HCTZ BP Readings from Last 3 Encounters:  01/27/21 130/76  01/08/21 (!) 166/81  11/21/20 135/75     Occ having sharp apin in [place]aft lateral lag above knee, numbness.   Dr. Ellene Route appt in next few weeks for  [facility] for spodylothesis.Marland Kitchen ? If numbness and bilateral subjective weakness due to this.  Relevant past medical, surgical, family and social history reviewed and updated as indicated. Interim medical history since our last visit reviewed. Allergies and medications reviewed and updated. Outpatient Medications Prior to Visit  Medication Sig Dispense Refill   atorvastatin (LIPITOR) 80 MG tablet TAKE 1 TABLET BY MOUTH DAILY. 90 tablet 3   Blood Glucose Monitoring Suppl (ONETOUCH VERIO REFLECT) w/Device KIT 2 (two) times daily. for testing     budesonide (ENTOCORT EC) 3 MG 24 hr  capsule Take 2 capsules by mouth daily.     cholecalciferol (VITAMIN D) 1000 units tablet Take 1,000 Units by mouth daily.     colesevelam (WELCHOL) 625 MG tablet TAKE 3 TABLETS BY MOUTH 2 TIMES DAILY WITH A MEAL. 540 tablet 3   ELIQUIS 2.5 MG TABS tablet TAKE 1 TABLET BY MOUTH 2 TIMES DAILY. 180 tablet 0   Eptinezumab-jjmr (VYEPTI) 100 MG/ML injection Inject 300 mg into the vein.     gabapentin (NEURONTIN) 100 MG capsule Take 100 mg by mouth 3 (three) times daily as needed.     gabapentin (NEURONTIN) 300 MG capsule Take 2 capsules (600 mg total) by mouth at bedtime. 180 capsule 0   insulin glargine (LANTUS SOLOSTAR) 100 UNIT/ML Solostar Pen Inject 55 Units into the skin daily.     Lancets (ONETOUCH DELICA PLUS NOMVEH20N) MISC USE TO CHECK BLOOD SUGAR TWO TIMES DAILY 100 each 12   losartan-hydrochlorothiazide (HYZAAR) 50-12.5 MG tablet Take 1 tablet by mouth daily. 90 tablet 3   omeprazole (PRILOSEC) 20 MG capsule Take 20 mg by mouth 2 (two) times daily before a meal.     ONETOUCH VERIO test strip USE TO CHECK BLOOD SUGAR TWO TIMES A DAY 100 strip 12   potassium chloride SA (KLOR-CON) 20 MEQ tablet Take 1 tablet (20 mEq total) by mouth daily. 90 tablet 3   promethazine (PHENERGAN)  12.5 MG tablet Take 12.5 mg by mouth every 6 (six) hours as needed for nausea or vomiting.     UBRELVY 100 MG TABS TAKE 1 TABLET BY MOUTH AS NEEDED (MAY REPEAT DOSE AFTER 2 HOURS IF NEEDED. MAXIMUM 2 TABLETS IN 24 HOURS). 10 tablet 3   ULTICARE SHORT PEN NEEDLES 31G X 8 MM MISC USE TO INJECT INSULIN DAILY 100 each 2   vitamin B-12 (CYANOCOBALAMIN) 1000 MCG tablet Take 1 tablet by mouth daily.     Meclizine HCl 25 MG CHEW Chew 25 mg by mouth every 8 (eight) hours as needed. (Patient not taking: Reported on 01/21/2021)     No facility-administered medications prior to visit.     Per HPI unless specifically indicated in ROS section below Review of Systems  Constitutional:  Negative for fatigue and fever.  HENT:   Negative for congestion.   Eyes:  Negative for pain.  Respiratory:  Negative for cough and shortness of breath.   Cardiovascular:  Negative for chest pain, palpitations and leg swelling.  Gastrointestinal:  Negative for abdominal pain.  Genitourinary:  Negative for dysuria and vaginal bleeding.  Musculoskeletal:  Negative for back pain.  Neurological:  Negative for syncope, light-headedness and headaches.  Psychiatric/Behavioral:  Negative for dysphoric mood.   Objective:  BP 130/76   Pulse 89   Temp 97.7 F (36.5 C) (Temporal)   Ht 5' (1.524 m)   Wt 162 lb 8 oz (73.7 kg)   SpO2 98%   BMI 31.74 kg/m   Wt Readings from Last 3 Encounters:  01/27/21 162 lb 8 oz (73.7 kg)  01/08/21 162 lb 6.4 oz (73.7 kg)  11/21/20 162 lb (73.5 kg)      Physical Exam Constitutional:      General: She is not in acute distress.    Appearance: Normal appearance. She is well-developed. She is not ill-appearing or toxic-appearing.  HENT:     Head: Normocephalic.     Right Ear: Hearing, tympanic membrane, ear canal and external ear normal. Tympanic membrane is not erythematous, retracted or bulging.     Left Ear: Hearing, tympanic membrane, ear canal and external ear normal. Tympanic membrane is not erythematous, retracted or bulging.     Nose: No mucosal edema or rhinorrhea.     Right Sinus: No maxillary sinus tenderness or frontal sinus tenderness.     Left Sinus: No maxillary sinus tenderness or frontal sinus tenderness.     Mouth/Throat:     Pharynx: Uvula midline.  Eyes:     General: Lids are normal. Lids are everted, no foreign bodies appreciated.     Conjunctiva/sclera: Conjunctivae normal.     Pupils: Pupils are equal, round, and reactive to light.  Neck:     Thyroid: No thyroid mass or thyromegaly.     Vascular: No carotid bruit.     Trachea: Trachea normal.  Cardiovascular:     Rate and Rhythm: Normal rate and regular rhythm.     Pulses: Normal pulses.     Heart sounds: Normal heart  sounds, S1 normal and S2 normal. No murmur heard.   No friction rub. No gallop.  Pulmonary:     Effort: Pulmonary effort is normal. No tachypnea or respiratory distress.     Breath sounds: Normal breath sounds. No decreased breath sounds, wheezing, rhonchi or rales.  Abdominal:     General: Bowel sounds are normal.     Palpations: Abdomen is soft.     Tenderness: There is no abdominal  tenderness.  Musculoskeletal:     Cervical back: Normal range of motion and neck supple.  Skin:    General: Skin is warm and dry.     Findings: No rash.  Neurological:     Mental Status: She is alert.  Psychiatric:        Mood and Affect: Mood is not anxious or depressed.        Speech: Speech normal.        Behavior: Behavior normal. Behavior is cooperative.        Thought Content: Thought content normal.        Judgment: Judgment normal.      Results for orders placed or performed in visit on 12/18/20  HM DIABETES EYE EXAM  Result Value Ref Range   HM Diabetic Eye Exam No Retinopathy No Retinopathy    This visit occurred during the SARS-CoV-2 public health emergency.  Safety protocols were in place, including screening questions prior to the visit, additional usage of staff PPE, and extensive cleaning of exam room while observing appropriate contact time as indicated for disinfecting solutions.   COVID 19 screen:  No recent travel or known exposure to COVID19 The patient denies respiratory symptoms of COVID 19 at this time. The importance of social distancing was discussed today.   Assessment and Plan Problem List Items Addressed This Visit     Hypertension associated with diabetes ([place]) (Chronic)    Stable, chronic.  Continue current medication.  Losartan HCTZ 50/12.5 mg daily.      Type 2 diabetes mellitus with hyperglycemia (HCC) - Primary (Chronic)    Chronic , improving control in last 3 months.   On continued budesonide.   Work on increased activity. Lantus 55 Units daily.       Relevant Orders   POCT glycosylated hemoglobin (Hb A1C) (Completed)   Leg weakness, bilateral    Discuss at upcoming OV with Dr. Ellene Route.          Eliezer Lofts, MD

## 2021-01-27 NOTE — Assessment & Plan Note (Signed)
Stable, chronic.  Continue current medication.  Losartan HCTZ 50/12.5 mg daily.

## 2021-01-27 NOTE — Assessment & Plan Note (Signed)
Discuss at upcoming OV with Dr. Ellene Route.

## 2021-01-27 NOTE — Patient Instructions (Signed)
Keep working on healthy eating and regular activity as balance allows!  Continue Lantus at 55 Units daily.

## 2021-02-02 ENCOUNTER — Ambulatory Visit: Payer: Medicare Other | Admitting: Physical Therapy

## 2021-02-02 ENCOUNTER — Encounter: Payer: Self-pay | Admitting: Physical Therapy

## 2021-02-02 ENCOUNTER — Other Ambulatory Visit: Payer: Self-pay

## 2021-02-02 DIAGNOSIS — R42 Dizziness and giddiness: Secondary | ICD-10-CM | POA: Diagnosis not present

## 2021-02-02 DIAGNOSIS — R2681 Unsteadiness on feet: Secondary | ICD-10-CM

## 2021-02-02 NOTE — Therapy (Signed)
Neuromuscular neuromuscular neuromuscular neuromuscular neuromas neuromuscular neuromuscular [place], [place], [address] Phone: [phone]   Fax:  [phone]  Physical Therapy Treatment  Patient Details  Name: Debra Leach MRN: [id number] Date of Birth: 07-21-1950 Referring Provider (PT): Dr. Tomi Likens   Encounter Date: 02/02/2021   PT End of Session - 02/02/21 0900     Visit Number 3    Number of Visits 12    Date for PT Re-Evaluation 04/15/21    PT Start Time 0900    PT Stop Time 0942    PT Time Calculation (min) 42 min    Equipment Utilized During Treatment Gait belt    Activity Tolerance Patient tolerated treatment well    Behavior During Therapy WFL for tasks assessed/performed             Past Medical History:  Diagnosis Date   Anemia    Arthritis    osteoarthritis B knees, left hip , right elbow   Clotting disorder (HCC)    Complication of anesthesia    difficult waking   Diabetes mellitus without complication (HCC)    Diet and exercise controlled   GERD (gastroesophageal reflux disease)    Headache    History of kidney stones    Hyperlipidemia    Hypertension    MVP (mitral valve prolapse)    history of   PONV (postoperative nausea and vomiting)     Past Surgical History:  Procedure Laterality Date   arm surgery     BREAST BIOPSY Left    BREAST SURGERY  2000   breast biopsy (benign)   LITHOTRIPSY  08-12-2008   stent placed bilaterally   PARTIAL HYSTERECTOMY     Both ovaries remain, vaginal, for [place]    There were no vitals filed for this visit.   Subjective Assessment - 02/02/21 0859     Subjective Patient states she had difficulty doing the full HEP the first day but after that she was able to complete all. Patient states the vertical head turns she has to cross her eyes to get her eyes to track and this  causes her to have migraines. Patient states on Fri and Sat she had to go back to bed because of migraines. states the dizziness is starting to come down some. able to progress from standing hardwoods to standing on pillow. walking more steadily through the house and not having as much waves.    Pertinent History Pt reports that she began to have episodes of dizziness in January 2022. Pt describes her dizziness as a rocking sensation like when getting off a boat and feels like she gets waves of dizziness where she feels like she is moving. Pt describes her dizziness as vertigo, unsteadiness, lightheadedness. Pt's symptoms are motion provoked and intermittent in nature. Pt reports that she is getting multiple daily episodes of dizziness that comes in waves and can last a few minutes to several hours. Pt reports that head turns, body turns, bending over, looking back, quick movements all exacerbate her symptoms.Pt reports that she does not do quick movements in order to decrease her symptoms. Pt reports showering and washing her hair will also bring on her dizziness symptoms. Pt reports that going to sleep at night helps to decrease her symptoms. Pt reports that she does not get dizziness when she is lying in bed. Pt reports that her  symptoms of dizziness are worse later in the day. Pt reports that she has seen Dr. Tami Ribas, ENT physician, in regards to her dizziness symptoms and reports that she had VNG testing performed. Pt reports the Dix-Hallpike testing was negative and that the VNG indicated potential central findings. Pt was referred to Dr. Tomi Likens, neurologist, for further follow-up and was prescribed Meclizine. Pt reports that she began to use a single-point cane in June upon recommendation by Dr. Tami Ribas due to patient's balance deficits. Pt reports that she has had daily episodes of migraines since the age of 56. Pt states that she used to get migraine pain levels of 8-9/10 and reports that now her pain  levels are 5/10 since starting on the newer migraine medications in 2018. Pt reports that most days she does have migraines but reports that they are significantly decreased in intensity, duration and frequency since starting the new medications. Pt states she has had 14 migraine free days during this last month and that the migraines were shorter in duration lasting a few hours and less severity. Pt reports that after lunch is when she is more prone to getting migraines and states that she tires more easily after lunch.  Pt reports that the dizziness symptoms do not coincide with her migraines. Pt states that in September 2020 she had 3 blood clots in her lungs and 3 blood clots in her leg.  Pt states that she used to walk 4 to 5 miles a day and was biking 30 minutes on multiple days a week.    Limitations Walking    Diagnostic tests MRA Head and Brain MRI: 1. No evidence of acute intracranial abnormality. Specifically, no  acute infarct.  2. No large vessel occlusion or proximal hemodynamically significant  stenosis.    Patient Stated Goals to have decreased dizziness and improved balance            Neuromuscular Re-education:  VOR X 1 exercise:  Patient performed VOR X 1 horizontal in standing with conflicting background 2 reps of 1 minute each.  Patient demonstrates good technique and speed. Patient denies dizziness with this activity.   Body Wall Rolls:  Patient performed 5 reps of supported, body wall rolls with eyes open.  Patient reports no dizziness with this activity.   Hallway ball toss:  In hallway, worked on ball toss against one wall with alternating quick turns to toss ball against opposite wall while tracking with eyes and head.   Ambulation with head turns:  Patient performed 175' trials of forwards ambulation with horizontal, vertical and diagonal head turns with CGA.  Patient performed 175 feet trials of retroambulation with vertical head turns with contact-guard assist.   Note patient with decreased cadence with retroambulation. Patient demonstrates no veering but mild uneven steppage with retro ambulation with head turns and with forward ambulation with horizontal and diagonal head turning.  Patient denies dizziness but reports that it takes increased concentration and that she feels like her feet are trying to move with her head with these activities.  Diona Foley toss to self:  Patient performed static sitting while tossing ball to self horizontal and then vertical while tracking ball with head and eyes.  Patient denies dizziness with this activity.      PT Education - 02/02/21 0900     Education Details discussed home exercise program    Person(s) Educated Patient    Methods Explanation    Comprehension Verbalized understanding  PT Short Term Goals - 01/21/21 1449       PT SHORT TERM GOAL #1   Title Pt will be independent with HEP in order to improve balance and decrease dizziness symptoms in order to decrease fall risk and improve function at home and in the community    Time 4    Period Weeks    Status New    Target Date 02/18/21               PT Long Term Goals - 01/21/21 1450       PT LONG TERM GOAL #1   Title Patient will have improved FOTO score of 6 points or greater in order to demonstrate improvements in patient's ADLs and functional performance.    Baseline Scored 45/100 on 01/21/2021;    Time 12    Period Weeks    Status New    Target Date 04/15/21      PT LONG TERM GOAL #2   Title Patient will demonstrate reduced falls risk as evidenced by Dynamic Gait Index (DGI) >19/24.    Baseline Scored 11/24 on 04/15/2021;    Time 12    Period Weeks    Status New    Target Date 04/15/21      PT LONG TERM GOAL #3   Title Patient will reduce falls risk as indicated by Activities Specific Balance Confidence Scale (ABC) >67%.    Baseline Scored 32.5% on 01/21/2021;    Time 12    Period Weeks    Status New    Target  Date 04/15/21      PT LONG TERM GOAL #4   Title Patient will reduce perceived disability to low levels as indicated by <40 on Dizziness Handicap Inventory.    Baseline Scored 56/100 moderate perception of handicap on 01/21/2021;    Time 12    Period Weeks    Status New    Target Date 04/15/21                   Plan - 02/02/21 1654     Clinical Impression Statement Patient reports that she has continued to do home exercise program daily.  Patient reports that initially when she began to do the added exercises of feet together and semitandem progression with head turns that this recreated some migraine symptoms, but reports that these improved with continued practice after a few days.  Patient reports that she has noticed significantly decreased dizziness over the weekend and today.  Patient able to progress to VOR x1 in standing with conflicting background.  Patient did well with body wall rolls and hallway ball toss activities.  Patient challenged by ambulation with head turns especially with horizontal and diagonal head turns with uneven steppage noted but no loss of balance and patient denied dizziness symptoms.  Next session will consider adding conflicting background with VOR x1 exercise for home program.  Next session plan on working on progressions of body wall rolls and try ambulation with ball toss to self and ball toss over shoulder.  Patient would benefit from continued vestibular PT services to further address functional deficits and goals as set on plan of care.    Personal Factors and Comorbidities Comorbidity 3+;Time since onset of injury/illness/exacerbation    Comorbidities Migraines, hypertension, type 2 diabetes, bilateral eustachian tube disorder    Examination-Activity Limitations Bend;Bathing;Locomotion Level;Stairs;Transfers;Stand    Examination-Participation Restrictions Driving;Community Activity    Stability/Clinical Decision Making Evolving/Moderate complexity     Rehab  Potential Good    PT Frequency 1x / week    PT Duration 12 weeks    PT Treatment/Interventions Canalith Repostioning;Balance training;Therapeutic exercise;Therapeutic activities;Stair training;Gait training;Functional mobility training;Neuromuscular re-education;Patient/family education;Vestibular    PT Next Visit Plan Review VOR x1, together and semitandem progressions with head turns on firm surface and body wall rolls    PT Home Exercise Plan Access Code: [email or web address]  URL: [email or web address]  Date: 01/21/2021  Prepared by: Lady Deutscher    Exercises  Seated VOR X1 exercise - 3 x daily - 7 x weekly - 1 sets - 3 reps - 30 second hold    Consulted and Agree with Plan of Care Patient             Patient will benefit from skilled therapeutic intervention in order to improve the following deficits and impairments:  Decreased endurance, Decreased strength, Decreased balance, Difficulty walking, Decreased mobility, Dizziness  Visit Diagnosis: Dizziness and giddiness  Unsteadiness on feet     Problem List Patient Active Problem List   Diagnosis Date Noted   Nausea 08/15/2020   Thyroid nodule 04/11/2020   Right leg pain 04/11/2020   Leg weakness, bilateral 10/30/2019   Situational anxiety 10/12/2019   AK (actinic keratosis) 09/26/2019   Dyspnea on minimal exertion 07/13/2019   Stuttering 07/13/2019   Hypertension associated with diabetes ([place]) 07/06/2019   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[place]) 07/06/2019   Multiple thyroid nodules 03/27/2019   DVT, lower extremity, proximal, acute, right ([place]) 03/13/2019   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[place]) 03/01/2019   Leukocytosis 03/01/2019   Breast nodule 03/01/2019   Abnormal renal finding 06/29/2018   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   Facial rash 03/01/2017   Eustachian tube disorder, bilateral 12/16/2016   Lightheadedness 12/16/2016   Esophageal dysphagia 11/22/2016   Chronic low  back pain 10/07/2016   Vision changes 08/12/2016   Aortic atherosclerosis ([place]) 06/11/2016   Supraclavicular fossa fullness 06/08/2016   Family history of aortic aneurysm 08/20/2014   Vitamin D deficiency 08/20/2014   Type 2 diabetes mellitus with hyperglycemia ([place]) 01/08/2011   Osteopenia 11/06/2009   Hyperlipidemia associated with type 2 diabetes mellitus ([place]) 07/05/2008   INSOMNIA, CHRONIC 07/05/2008   Migraine with aura 07/05/2008   GERD 07/05/2008   OSTEOARTHRITIS 07/05/2008   MITRAL VALVE PROLAPSE, HX OF 07/05/2008   NEPHROLITHIASIS, HX OF 07/05/2008   Lady Deutscher PT, DPT [phone] Lady Deutscher 02/02/2021, 4:57 PM  [facility] MAIN [facility] SERVICES [address] [place], [place], [address] Phone: [phone]   Fax:  [phone]  Name: Polina Mcguffie MRN: [id number] Date of Birth: Apr 29, 1951

## 2021-02-03 ENCOUNTER — Encounter: Payer: Medicare Other | Admitting: Physical Therapy

## 2021-02-10 ENCOUNTER — Ambulatory Visit: Payer: Medicare Other | Attending: Neurology | Admitting: Physical Therapy

## 2021-02-10 ENCOUNTER — Encounter: Payer: Self-pay | Admitting: Physical Therapy

## 2021-02-10 ENCOUNTER — Other Ambulatory Visit: Payer: Self-pay

## 2021-02-10 DIAGNOSIS — R2681 Unsteadiness on feet: Secondary | ICD-10-CM | POA: Insufficient documentation

## 2021-02-10 DIAGNOSIS — R42 Dizziness and giddiness: Secondary | ICD-10-CM | POA: Diagnosis not present

## 2021-02-10 NOTE — Therapy (Signed)
[place] MAIN [facility] SERVICES [place], [place], [address] Phone: [phone]   Fax:  [phone]  Physical Therapy Treatment  Patient Details  Name: Debra Leach MRN: [id number] Date of Birth: 08/27/1950 Referring Provider (PT): Dr. Tomi Likens   Encounter Date: 02/10/2021   PT End of Session - 02/10/21 0812     Visit Number 4    Number of Visits 12    Date for PT Re-Evaluation 04/15/21    PT Start Time 0812    PT end time 902   Total treatment time 50 minutes   Equipment Utilized During Treatment Gait belt    Activity Tolerance Patient tolerated treatment well    Behavior During Therapy [facility] for tasks assessed/performed             Past Medical History:  Diagnosis Date   Anemia    Arthritis    osteoarthritis B knees, left hip , right elbow   Clotting disorder (HCC)    Complication of anesthesia    difficult waking   Diabetes mellitus without complication (HCC)    Diet and exercise controlled   GERD (gastroesophageal reflux disease)    Headache    History of kidney stones    Hyperlipidemia    Hypertension    MVP (mitral valve prolapse)    history of   PONV (postoperative nausea and vomiting)     Past Surgical History:  Procedure Laterality Date   arm surgery     BREAST BIOPSY Left    BREAST SURGERY  2000   breast biopsy (benign)   LITHOTRIPSY  08-12-2008   stent placed bilaterally   PARTIAL HYSTERECTOMY     Both ovaries remain, vaginal, for [place]    There were no vitals filed for this visit.    Subjective Assessment - 02/14/21 1435     Subjective Patient states she got back injections last Thursday. Patient states it has disrupted her sleep and caused her blood sugars to spike. Fasting blood sugar this am was 220 and she sent her primary doctor a message on My Chart.  Patient states she was up from 1 to 3 AM this morning and she says it takes about  3 weeks to return to normal. Patient states her back initially got worse, but states it is better some now. Patient arrived using SPC. Patient states her autoimmune pancreatitis she feels has been flaring up with achiness and nausea.  Patient states she had a rough day Friday and today. Patient states she is having more good days than  bad with doing the exercises and did well over the weekend with the exercises.    Pertinent History Pt reports that she began to have episodes of dizziness in January 2022. Pt describes her dizziness as a rocking sensation like when getting off a boat and feels like she gets waves of dizziness where she feels like she is moving. Pt describes her dizziness as vertigo, unsteadiness, lightheadedness. Pt's symptoms are motion provoked and intermittent in nature. Pt reports that she is getting multiple daily episodes of dizziness that comes in waves and can last a few minutes to several hours. Pt reports that head turns, body turns, bending over, looking back, quick movements all exacerbate her symptoms.Pt reports that she does not do quick movements in order to decrease her symptoms. Pt reports showering and washing her hair will also bring on her dizziness symptoms.  Pt reports that going to sleep at night helps to decrease her symptoms. Pt reports that she does not get dizziness when she is lying in bed. Pt reports that her symptoms of dizziness are worse later in the day. Pt reports that she has seen Dr. Tami Ribas, ENT physician, in regards to her dizziness symptoms and reports that she had VNG testing performed. Pt reports the Dix-Hallpike testing was negative and that the VNG indicated potential central findings. Pt was referred to Dr. Tomi Likens, neurologist, for further follow-up and was prescribed Meclizine. Pt reports that she began to use a single-point cane in June upon recommendation by Dr. Tami Ribas due to patient's balance deficits. Pt reports that she has had daily episodes of  migraines since the age of 88. Pt states that she used to get migraine pain levels of 8-9/10 and reports that now her pain levels are 5/10 since starting on the newer migraine medications in 2018. Pt reports that most days she does have migraines but reports that they are significantly decreased in intensity, duration and frequency since starting the new medications. Pt states she has had 14 migraine free days during this last month and that the migraines were shorter in duration lasting a few hours and less severity. Pt reports that after lunch is when she is more prone to getting migraines and states that she tires more easily after lunch.  Pt reports that the dizziness symptoms do not coincide with her migraines. Pt states that in September 2020 she had 3 blood clots in her lungs and 3 blood clots in her leg.  Pt states that she used to walk 4 to 5 miles a day and was biking 30 minutes on multiple days a week.    Limitations Walking    Diagnostic tests MRA Head and Brain MRI: 1. No evidence of acute intracranial abnormality. Specifically, no  acute infarct.  2. No large vessel occlusion or proximal hemodynamically significant  stenosis.    Patient Stated Goals to have decreased dizziness and improved balance             Neuromuscular Re-education:   VOR X 1 exercise:  Patient performed VOR X 1 horizontal in standing with conflicting background 3 reps of 1 minute each with verbal cues for technique.  Patient denies dizziness with this activity.  Added conflicting background home exercise program.  Body Wall Rolls:  Patient performed 6 reps of supported, body wall rolls with eyes closed with min assist secondary to 2 losses of balance took extra steps during turning due to imbalance.  Patient denies dizziness except at the very end.   Diona Foley toss to self:  Patient performed static standing while tossing ball to self horizontal and then vertical while tracking ball with head and eyes.   Diona Foley  toss over shoulder: Patient performed multiple 175' trials of forward and retro ambulation while tossing ball over one shoulder with return catch over opposite shoulder with CGA.  Patient reports increase in dizziness with this activity.  She had a few minor uneven steppages no overt losses of balance.   Patient reported mild lightheadedness during turning and this activity.  Ambulation with head turns:  Patient performed 125' trials of forwards and retro ambulation with diagonal head turns with CGA. Patient reports mild lightheadedness to few times during this activity.  Next session try quick turns.    PT Education - 02/10/21 [phone]     Education Details Discussed home exercise program and added conflicting background progression as  well as added diagonal head turns progression to feet together and semitandem stance holds for home exercise program.   Person(s) Educated Patient    Methods Explanation    Comprehension Verbalized understanding              PT Short Term Goals - 01/21/21 1449       PT SHORT TERM GOAL #1   Title Pt will be independent with HEP in order to improve balance and decrease dizziness symptoms in order to decrease fall risk and improve function at home and in the community    Time 4    Period Weeks    Status New    Target Date 02/18/21               PT Long Term Goals - 01/21/21 1450       PT LONG TERM GOAL #1   Title Patient will have improved FOTO score of 6 points or greater in order to demonstrate improvements in patient's ADLs and functional performance.    Baseline Scored 45/100 on 01/21/2021;    Time 12    Period Weeks    Status New    Target Date 04/15/21      PT LONG TERM GOAL #2   Title Patient will demonstrate reduced falls risk as evidenced by Dynamic Gait Index (DGI) >19/24.    Baseline Scored 11/24 on 04/15/2021;    Time 12    Period Weeks    Status New    Target Date 04/15/21      PT LONG TERM GOAL #3   Title Patient will  reduce falls risk as indicated by Activities Specific Balance Confidence Scale (ABC) >67%.    Baseline Scored 32.5% on 01/21/2021;    Time 12    Period Weeks    Status New    Target Date 04/15/21      PT LONG TERM GOAL #4   Title Patient will reduce perceived disability to low levels as indicated by <40 on Dizziness Handicap Inventory.    Baseline Scored 56/100 moderate perception of handicap on 01/21/2021;    Time 12    Period Weeks    Status New    Target Date 04/15/21               Plan - 02/14/21 1455     Clinical Impression Statement Patient able to progress to conflicting background with VOR x1 exercise and added this progression to home program.  Patient challenged by body wall roll exercise and ball toss over shoulder exercises this date.  Patient reported mild lightheadedness during turning during activities this session.  We will plan on trying quick turns and Airex balance beam activities next session.  Patient would benefit from continued PT services to further address goals.    Personal Factors and Comorbidities Comorbidity 3+;Time since onset of injury/illness/exacerbation    Comorbidities Migraines, hypertension, type 2 diabetes, bilateral eustachian tube disorder    Examination-Activity Limitations Bend;Bathing;Locomotion Level;Stairs;Transfers;Stand    Examination-Participation Restrictions Driving;Community Activity    Stability/Clinical Decision Making Evolving/Moderate complexity    Rehab Potential Good    PT Frequency 1x / week    PT Duration 12 weeks    PT Treatment/Interventions Canalith Repostioning;Balance training;Therapeutic exercise;Therapeutic activities;Stair training;Gait training;Functional mobility training;Neuromuscular re-education;Patient/family education;Vestibular    PT Next Visit Plan Review VOR x1, together and semitandem progressions with head turns on firm surface and body wall rolls    PT Home Exercise Plan Access Code: [email or web address]  URL:  [email or web address]  Date: 01/21/2021  Prepared by: Lady Deutscher    Exercises  Seated VOR X1 exercise - 3 x daily - 7 x weekly - 1 sets - 3 reps - 30 second hold    Consulted and Agree with Plan of Care Patient                   Patient will benefit from skilled therapeutic intervention in order to improve the following deficits and impairments:     Visit Diagnosis: Dizziness and giddiness  Unsteadiness on feet     Problem List Patient Active Problem List   Diagnosis Date Noted   Nausea 08/15/2020   Thyroid nodule 04/11/2020   Right leg pain 04/11/2020   Leg weakness, bilateral 10/30/2019   Situational anxiety 10/12/2019   AK (actinic keratosis) 09/26/2019   Dyspnea on minimal exertion 07/13/2019   Stuttering 07/13/2019   Hypertension associated with diabetes ([place]) 07/06/2019   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[place]) 07/06/2019   Multiple thyroid nodules 03/27/2019   DVT, lower extremity, proximal, acute, right ([place]) 03/13/2019   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[place]) 03/01/2019   Leukocytosis 03/01/2019   Breast nodule 03/01/2019   Abnormal renal finding 06/29/2018   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   Facial rash 03/01/2017   Eustachian tube disorder, bilateral 12/16/2016   Lightheadedness 12/16/2016   Esophageal dysphagia 11/22/2016   Chronic low back pain 10/07/2016   Vision changes 08/12/2016   Aortic atherosclerosis ([place]) 06/11/2016   Supraclavicular fossa fullness 06/08/2016   Family history of aortic aneurysm 08/20/2014   Vitamin D deficiency 08/20/2014   Type 2 diabetes mellitus with hyperglycemia ([place]) 01/08/2011   Osteopenia 11/06/2009   Hyperlipidemia associated with type 2 diabetes mellitus ([place]) 07/05/2008   INSOMNIA, CHRONIC 07/05/2008   Migraine with aura 07/05/2008   GERD 07/05/2008   OSTEOARTHRITIS 07/05/2008   MITRAL VALVE PROLAPSE, HX OF 07/05/2008   NEPHROLITHIASIS, HX OF 07/05/2008    Lady Deutscher PT, DPT [phone]  Lady Deutscher 02/10/2021, 8:12 AM  [place] [facility] MAIN [facility] SERVICES [address] [place], [place], [address] Phone: [phone]   Fax:  [phone]  Name: Debra Leach MRN: [id number] Date of Birth: Dec 02, 1950

## 2021-02-17 ENCOUNTER — Ambulatory Visit: Payer: Medicare Other | Admitting: Physical Therapy

## 2021-02-18 ENCOUNTER — Ambulatory Visit
Admission: RE | Admit: 2021-02-18 | Discharge: 2021-02-18 | Disposition: A | Discharge status: Home / Self Care | Payer: Medicare Other | Source: Ambulatory Visit

## 2021-02-18 ENCOUNTER — Other Ambulatory Visit: Payer: Self-pay

## 2021-02-18 DIAGNOSIS — Z1231 Encounter for screening mammogram for malignant neoplasm of breast: Secondary | ICD-10-CM

## 2021-02-19 ENCOUNTER — Other Ambulatory Visit: Payer: Self-pay | Admitting: Family Medicine

## 2021-02-19 DIAGNOSIS — E119 Type 2 diabetes mellitus without complications: Secondary | ICD-10-CM

## 2021-02-24 ENCOUNTER — Encounter: Payer: Self-pay | Admitting: Physical Therapy

## 2021-02-24 ENCOUNTER — Other Ambulatory Visit: Payer: Self-pay

## 2021-02-24 ENCOUNTER — Ambulatory Visit: Payer: Medicare Other | Admitting: Physical Therapy

## 2021-02-24 DIAGNOSIS — R2681 Unsteadiness on feet: Secondary | ICD-10-CM

## 2021-02-24 DIAGNOSIS — R42 Dizziness and giddiness: Secondary | ICD-10-CM | POA: Diagnosis not present

## 2021-02-24 NOTE — Therapy (Signed)
[place] MAIN [facility] SERVICES [place], [place], [address] Phone: [phone]   Fax:  [phone]  Physical Therapy Treatment  Patient Details  Name: Debra Leach MRN: [id number] Date of Birth: [birth year] Referring Provider (PT): Dr. Tomi Likens   Encounter Date: 02/24/2021   PT End of Session - 02/24/21 0901     Visit Number 5    Number of Visits 12    Date for PT Re-Evaluation 04/15/21    PT Start Time 0900    PT Stop Time 0946    PT Time Calculation (min) 46 min    Equipment Utilized During Treatment Gait belt    Activity Tolerance Patient tolerated treatment well    Behavior During Therapy WFL for tasks assessed/performed             Past Medical History:  Diagnosis Date   Anemia    Arthritis    osteoarthritis B knees, left hip , right elbow   Clotting disorder (HCC)    Complication of anesthesia    difficult waking   Diabetes mellitus without complication (HCC)    Diet and exercise controlled   GERD (gastroesophageal reflux disease)    Headache    History of kidney stones    Hyperlipidemia    Hypertension    MVP (mitral valve prolapse)    history of   PONV (postoperative nausea and vomiting)     Past Surgical History:  Procedure Laterality Date   arm surgery     BREAST BIOPSY Left    BREAST SURGERY  2000   breast biopsy (benign)   LITHOTRIPSY  08-12-2008   stent placed bilaterally   PARTIAL HYSTERECTOMY     Both ovaries remain, vaginal, for [place]    There were no vitals filed for this visit.   Subjective Assessment - 02/24/21 0900     Subjective Patient states she has been better able to do the body wall rolls exercise at home.  Patient states that she has not had any dizziness symptoms since Thursday morning until this morning.  Patient states that she felt well enough to drive on the highway for the first time since March this last Friday.   Patient states she has been walking in the community without her cane for the last several days without imbalance.  Patient states she got new glasses last week on Friday and that she had a migraine that lasted about 2 to 3 days that started on Saturday.  Patient states she has not taken any additional migraine medicine except for her regular Gabapentin. Patient states that she had a small amount of dizziness this morning prior to session but states that she has not had any dizziness during session this date.    Pertinent History Pt reports that she began to have episodes of dizziness in January 2022. Pt describes her dizziness as a rocking sensation like when getting off a boat and feels like she gets waves of dizziness where she feels like she is moving. Pt describes her dizziness as vertigo, unsteadiness, lightheadedness. Pt's symptoms are motion provoked and intermittent in nature. Pt reports that she is getting multiple daily episodes of dizziness that comes in waves and can last a few minutes to several hours. Pt reports that head turns, body turns, bending over, looking back, quick movements all exacerbate her symptoms.Pt reports that she does not do quick movements in order to decrease  her symptoms. Pt reports showering and washing her hair will also bring on her dizziness symptoms. Pt reports that going to sleep at night helps to decrease her symptoms. Pt reports that she does not get dizziness when she is lying in bed. Pt reports that her symptoms of dizziness are worse later in the day. Pt reports that she has seen Dr. Tami Ribas, ENT physician, in regards to her dizziness symptoms and reports that she had VNG testing performed. Pt reports the Dix-Hallpike testing was negative and that the VNG indicated potential central findings. Pt was referred to Dr. Tomi Likens, neurologist, for further follow-up and was prescribed Meclizine. Pt reports that she began to use a single-point cane in June upon recommendation by  Dr. Tami Ribas due to patient's balance deficits. Pt reports that she has had daily episodes of migraines since the age of 50. Pt states that she used to get migraine pain levels of 8-9/10 and reports that now her pain levels are 5/10 since starting on the newer migraine medications in 2018. Pt reports that most days she does have migraines but reports that they are significantly decreased in intensity, duration and frequency since starting the new medications. Pt states she has had 14 migraine free days during this last month and that the migraines were shorter in duration lasting a few hours and less severity. Pt reports that after lunch is when she is more prone to getting migraines and states that she tires more easily after lunch.  Pt reports that the dizziness symptoms do not coincide with her migraines. Pt states that in September 2020 she had 3 blood clots in her lungs and 3 blood clots in her leg.  Pt states that she used to walk 4 to 5 miles a day and was biking 30 minutes on multiple days a week.    Limitations Walking    Diagnostic tests MRA Head and Brain MRI: 1. No evidence of acute intracranial abnormality. Specifically, no  acute infarct.  2. No large vessel occlusion or proximal hemodynamically significant  stenosis.    Patient Stated Goals to have decreased dizziness and improved balance              Neuromuscular Re-education:  VOR X 1 exercise:  Patient performed walking VOR X 1 horizontal with conflicting background 4 reps of about 7' each.  Patient denies dizziness with this activity and demonstrates no veering or uneven steppage.   Airex balance beam: On Airex balance beam, performed sideways stepping with horizontal head turns 5' times 4 reps. On Airex balance beam, performed sideways stepping with vertical head turns 5' times 4 reps. On Airex balance beam, performed slow marching with 3-second hold 2 sets of of 10 repetitions.  Performed Airex balance beam activities with  contact-guard assist.   Diona Foley toss over shoulder: Patient performed multiple 150' trials of forward and retro ambulation while tossing ball over one shoulder with return catch over opposite shoulder varying the ball position to head, shoulder and waist level to promote head turning and tilting with CGA.  Quick Turns:  Patient performed 10 reps of walking 10' with alternating quick turns left and right with CGA.  Patient denies symptoms with this activity and demonstrated no losses of balance.   Next session plan on repeating functional outcome measures and comparing to prior testing.   Note: Portions of this document were prepared using Dragon voice recognition software and although reviewed may contain unintentional dictation errors in syntax, grammar, or spelling.  PT Education - 02/24/21 1114     Education Details Discussed plan to repeat outcome measures next session and to assess progress towards goals, discussed home exercise program    Person(s) Educated Patient    Methods Explanation    Comprehension Verbalized understanding              PT Short Term Goals - 01/21/21 1449       PT SHORT TERM GOAL #1   Title Pt will be independent with HEP in order to improve balance and decrease dizziness symptoms in order to decrease fall risk and improve function at home and in the community    Time 4    Period Weeks    Status New    Target Date 02/18/21               PT Long Term Goals - 01/21/21 1450       PT LONG TERM GOAL #1   Title Patient will have improved FOTO score of 6 points or greater in order to demonstrate improvements in patient's ADLs and functional performance.    Baseline Scored 45/100 on 01/21/2021;    Time 12    Period Weeks    Status New    Target Date 04/15/21      PT LONG TERM GOAL #2   Title Patient will demonstrate reduced falls risk as evidenced by Dynamic Gait Index (DGI) >19/24.    Baseline Scored 11/24 on 04/15/2021;    Time 12     Period Weeks    Status New    Target Date 04/15/21      PT LONG TERM GOAL #3   Title Patient will reduce falls risk as indicated by Activities Specific Balance Confidence Scale (ABC) >67%.    Baseline Scored 32.5% on 01/21/2021;    Time 12    Period Weeks    Status New    Target Date 04/15/21      PT LONG TERM GOAL #4   Title Patient will reduce perceived disability to low levels as indicated by <40 on Dizziness Handicap Inventory.    Baseline Scored 56/100 moderate perception of handicap on 01/21/2021;    Time 12    Period Weeks    Status New    Target Date 04/15/21               Plan - 02/27/21 0717     Clinical Impression Statement Patient reports that she has had multiple days this past week without any dizziness symptoms and states her balance has improved as well. Patient reports no dizziness or lightheadedness during session this date. Patient did well with walking VOR X 1 progression and was able to perform without any uneven steppage, loss of balance and patient reports no dizziness. Patient's balance challenged by activities on Airex balance beam. Will plan on repeating functional outcome measures next session. Patient encouraged to follow up as indicated.    Personal Factors and Comorbidities Comorbidity 3+;Time since onset of injury/illness/exacerbation    Comorbidities Migraines, hypertension, type 2 diabetes, bilateral eustachian tube disorder    Examination-Activity Limitations Bend;Bathing;Locomotion Level;Stairs;Transfers;Stand    Examination-Participation Restrictions Driving;Community Activity    Stability/Clinical Decision Making Evolving/Moderate complexity    Rehab Potential Good    PT Frequency 1x / week    PT Duration 12 weeks    PT Treatment/Interventions Canalith Repostioning;Balance training;Therapeutic exercise;Therapeutic activities;Stair training;Gait training;Functional mobility training;Neuromuscular re-education;Patient/family education;Vestibular     PT Next Visit Plan Review VOR x1, together and  semitandem progressions with head turns on firm surface and body wall rolls    PT Home Exercise Plan Access Code: [email or web address]  URL: [email or web address]  Date: 01/21/2021  Prepared by: Lady Deutscher    Exercises  Seated VOR X1 exercise - 3 x daily - 7 x weekly - 1 sets - 3 reps - 30 second hold    Consulted and Agree with Plan of Care Patient                   Patient will benefit from skilled therapeutic intervention in order to improve the following deficits and impairments:     Visit Diagnosis: Dizziness and giddiness  Unsteadiness on feet     Problem List Patient Active Problem List   Diagnosis Date Noted   Nausea 08/15/2020   Thyroid nodule 04/11/2020   Right leg pain 04/11/2020   Leg weakness, bilateral 10/30/2019   Situational anxiety 10/12/2019   AK (actinic keratosis) 09/26/2019   Dyspnea on minimal exertion 07/13/2019   Stuttering 07/13/2019   Hypertension associated with diabetes ([place]) 07/06/2019   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[place]) 07/06/2019   Multiple thyroid nodules 03/27/2019   DVT, lower extremity, proximal, acute, right ([place]) 03/13/2019   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[place]) 03/01/2019   Leukocytosis 03/01/2019   Breast nodule 03/01/2019   Abnormal renal finding 06/29/2018   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   Facial rash 03/01/2017   Eustachian tube disorder, bilateral 12/16/2016   Lightheadedness 12/16/2016   Esophageal dysphagia 11/22/2016   Chronic low back pain 10/07/2016   Vision changes 08/12/2016   Aortic atherosclerosis ([place]) 06/11/2016   Supraclavicular fossa fullness 06/08/2016   Family history of aortic aneurysm 08/20/2014   Vitamin D deficiency 08/20/2014   Type 2 diabetes mellitus with hyperglycemia ([place]) 01/08/2011   Osteopenia 11/06/2009   Hyperlipidemia associated with type 2 diabetes mellitus ([place]) 07/05/2008   INSOMNIA, CHRONIC  07/05/2008   Migraine with aura 07/05/2008   GERD 07/05/2008   OSTEOARTHRITIS 07/05/2008   MITRAL VALVE PROLAPSE, HX OF 07/05/2008   NEPHROLITHIASIS, HX OF 07/05/2008    Lady Deutscher PT, DPT #[id number]  Lady Deutscher, PT 02/24/2021, 11:18 AM  [place] [facility] MAIN [facility] SERVICES [address] [place], [place], [address] Phone: [phone]   Fax:  [phone]  Name: Debra Leach MRN: [id number] Date of Birth: [birth year]

## 2021-02-27 ENCOUNTER — Telehealth: Payer: Self-pay

## 2021-02-27 NOTE — Progress Notes (Addendum)
Chronic Care Management Pharmacy Assistant   Name: Debra Leach  MRN: [id number] DOB: February 11, 1951  Reason for Encounter: Diabetes Disease State   Recent office visits:  02/10/2021 - Eliezer Lofts, MD - Telephone - Patient presented for high blood glucose readings. Change: Increase insulin temporarily to 60 units daily.  01/27/2021 - Eliezer Lofts, MD - Patient presented for follow up for diabetes. Labs: A1c. Stopped: Meclizine HCl 25 MG CHEW patient reported not taking. Change: Lantus 55 Units daily.  10/24/2020 - Eliezer Lofts, MD - Patient presented for follow up for autoimmune pancreatitis. Change: Increase Lantus to 57 units. Advised to increase activity.   Recent consult visits:  02/24/2021 - Percell Boston, PT - Patient presented for physical therapy. 02/02/2021 - Percell Boston, PT - Patient presented for physical therapy. 02/02/2021 - Percell Boston, PT - Patient presented for physical therapy.  01/21/2021 - Percell Boston, PT - Patient presented for physical therapy.  01/21/2021 - Lady Deutscher, PT - Patient presented for Initial evaluation for PT.  01/08/2021 - Metta Clines, DO - Patient presented for chronic migraine. Ambulatory referral to physical therapy. Refer to vestibular rehabilitation  12/23/2020 [facility] - Patient presented for MR Brain wo contrast. 11/21/2020  Metta Clines, DO (Neurology) - Patient presented for dizziness. Labs: MR and MRA of brain.  11/05/2020 - [facility] - Patient presented for MR ABD [facility] CM/MRCP [place].  10/22/2020 - [facility] - Patient presented for follow-up for autoimmune pancreatitis. No medication changes. Labs: CBC, CMET, CRP, Sed rate, Lipase, Amylase, CA 19-9, IgG4 -MRCP W/WO.  Hospital visits:  None in previous 6 months  Medications: Outpatient Encounter Medications as of 02/27/2021  Medication Sig   atorvastatin (LIPITOR) 80 MG tablet TAKE 1 TABLET BY MOUTH DAILY.   Blood Glucose  Monitoring Suppl (ONETOUCH VERIO REFLECT) w/Device KIT 2 (two) times daily. for testing   budesonide (ENTOCORT EC) 3 MG 24 hr capsule Take 2 capsules by mouth daily.   cholecalciferol (VITAMIN D) 1000 units tablet Take 1,000 Units by mouth daily.   colesevelam (WELCHOL) 625 MG tablet TAKE 3 TABLETS BY MOUTH 2 TIMES DAILY WITH A MEAL.   ELIQUIS 2.5 MG TABS tablet TAKE 1 TABLET BY MOUTH 2 TIMES DAILY.   Eptinezumab-jjmr (VYEPTI) 100 MG/ML injection Inject 300 mg into the vein.   gabapentin (NEURONTIN) 100 MG capsule Take 100 mg by mouth 3 (three) times daily as needed.   gabapentin (NEURONTIN) 300 MG capsule Take 2 capsules (600 mg total) by mouth at bedtime.   insulin glargine (LANTUS SOLOSTAR) 100 UNIT/ML Solostar Pen INJECT 55 UNITS INTO THE SKIN DAILY   Insulin Pen Needle (ULTICARE SHORT PEN NEEDLES) 31G X 8 MM MISC USE TO INJECT INSULIN DAILY   Lancets (ONETOUCH DELICA PLUS [id number]) MISC USE TO CHECK BLOOD SUGAR TWO TIMES DAILY   losartan-hydrochlorothiazide (HYZAAR) 50-12.5 MG tablet Take 1 tablet by mouth daily.   omeprazole (PRILOSEC) 20 MG capsule Take 20 mg by mouth 2 (two) times daily before a meal.   ONETOUCH VERIO test strip USE TO CHECK BLOOD SUGAR TWO TIMES A DAY   potassium chloride SA (KLOR-CON) 20 MEQ tablet Take 1 tablet (20 mEq total) by mouth daily.   promethazine (PHENERGAN) 12.5 MG tablet Take 12.5 mg by mouth every 6 (six) hours as needed for nausea or vomiting.   UBRELVY 100 MG TABS TAKE 1 TABLET BY MOUTH AS NEEDED (MAY REPEAT DOSE AFTER 2 HOURS IF NEEDED. MAXIMUM 2 TABLETS IN 24 HOURS).  vitamin B-12 (CYANOCOBALAMIN) 1000 MCG tablet Take 1 tablet by mouth daily.   No facility-administered encounter medications on file as of 02/27/2021.    Recent Relevant Labs: Lab Results  Component Value Date/Time   HGBA1C 8.4 (A) 01/27/2021 08:35 AM   HGBA1C 9.3 (H) 10/22/2020 07:33 AM   HGBA1C 8.1 (H) 07/09/2020 07:52 AM   MICROALBUR 1.4 03/27/2018 10:49 AM   MICROALBUR <0.7  08/12/2016 10:35 AM    Kidney Function Lab Results  Component Value Date/Time   CREATININE 1.07 10/22/2020 07:33 AM   CREATININE 1.07 07/30/2020 08:57 AM   CREATININE 0.84 06/08/2016 10:32 AM   GFR 53.01 (L) 10/22/2020 07:33 AM   GFRNONAA >60 04/07/2020 09:55 AM   GFRAA >60 11/21/2019 11:36 AM   Unsuccessful attempts on 09/23, 09/27 and 09/28 to reach patient.    Current antihyperglycemic regimen:  Lantus temporarily increased to 60 units per day.    Unable to confirm with patient due to Unsuccessful attempts on 09/23, 09/27 and 09/28 to reach patient.    Adherence Review: Is the patient currently on a STATIN medication? Yes Is the patient currently on ACE/ARB medication? Yes Does the patient have >5 day gap between last estimated fill dates? No  Care Gaps: Annual wellness visit in last year? Yes 07/15/2020 Most recent A1C reading: 8.4 on 01/27/2021 Most Recent BP reading: 130/76 on 01/27/2021  Last eye exam / retinopathy screening: 12/18/2020 Last diabetic foot exam: 10/24/2020  Star Rating Drugs:  Medication:   Last Fill: Day Supply Losartan HTZ 50-12.4m 01/14/2021 90 Atorvastatin 890m 01/14/2021 90  No appointments scheduled within the next 30 days.  MiDebbora DusCPP notified  AmMarijean NiemannRMUtahlinical Pharmacy Assistant [phone] I have reviewed the care management and care coordination activities outlined in this encounter and I am certifying that I agree with the content of this note. No further action required.  MiDebbora DusPharmD Clinical Pharmacist [phone]

## 2021-03-10 ENCOUNTER — Ambulatory Visit: Payer: Medicare Other | Attending: Neurology | Admitting: Physical Therapy

## 2021-03-10 ENCOUNTER — Encounter: Payer: Self-pay | Admitting: Physical Therapy

## 2021-03-10 ENCOUNTER — Other Ambulatory Visit: Payer: Self-pay

## 2021-03-10 DIAGNOSIS — R2681 Unsteadiness on feet: Secondary | ICD-10-CM | POA: Diagnosis present

## 2021-03-10 DIAGNOSIS — R42 Dizziness and giddiness: Secondary | ICD-10-CM | POA: Diagnosis not present

## 2021-03-10 NOTE — Therapy (Signed)
[place] [facility] SERVICES [address] [place], [place], [address] Phone: [phone]   Fax:  [phone]  Physical Therapy Treatment/Discharge Summary Dates of Service: 01/21/21-03/10/21 Total Number of Visits: 6  Patient Details  Name: Debra Leach MRN: [id number] Date of Birth: 1951-04-06 Referring Provider (PT): Dr. Tomi Likens   Encounter Date: 03/10/2021   PT End of Session - 03/11/21 1753     Visit Number 6    Number of Visits 12    Date for PT Re-Evaluation 04/15/21    PT Start Time 0901    PT Stop Time 0950    PT Time Calculation (min) 49 min    Equipment Utilized During Treatment Gait belt    Activity Tolerance Patient tolerated treatment well    Behavior During Therapy WFL for tasks assessed/performed             Past Medical History:  Diagnosis Date   Anemia    Arthritis    osteoarthritis B knees, left hip , right elbow   Clotting disorder (HCC)    Complication of anesthesia    difficult waking   Diabetes mellitus without complication (HCC)    Diet and exercise controlled   GERD (gastroesophageal reflux disease)    Headache    History of kidney stones    Hyperlipidemia    Hypertension    MVP (mitral valve prolapse)    history of   PONV (postoperative nausea and vomiting)     Past Surgical History:  Procedure Laterality Date   arm surgery     BREAST BIOPSY Left    BREAST SURGERY  2000   breast biopsy (benign)   LITHOTRIPSY  08-12-2008   stent placed bilaterally   PARTIAL HYSTERECTOMY     Both ovaries remain, vaginal, for [place]    There were no vitals filed for this visit.   Subjective Assessment - 03/11/21 1753     Subjective Patient states she that she has been doing very well. Patient states she only missed 1 VORX1 set on Sunday, but states otherwise she has consistently done her HEP each day since the start of therapy. Pt states she did 4  hours 2 days in a row of surf fishing in the ocean this past week. Patient states she did not have any dizziness. Patient reports this was the first time she has been able to do this in a year. States she did not have to use a cane. states she has been able to turn sideways at kitchen counter without dizziness now. Patient reports that she has not had any dizziness in the past 2 weeks and states she has not even been able to trigger her dizziness any more.  Reports that she is very pleased with her progress.    Pertinent History Pt reports that she began to have episodes of dizziness in January 2022. Pt describes her dizziness as a rocking sensation like when getting off a boat and feels like she gets waves of dizziness where she feels like she is moving. Pt describes her dizziness as vertigo, unsteadiness, lightheadedness. Pt's symptoms are motion provoked and intermittent in nature. Pt reports that she is getting multiple daily episodes of dizziness that comes in waves and can last a few minutes to several hours. Pt reports that head turns, body turns, bending over, looking back, quick movements all exacerbate her symptoms.Pt reports that she does not do quick  movements in order to decrease her symptoms. Pt reports showering and washing her hair will also bring on her dizziness symptoms. Pt reports that going to sleep at night helps to decrease her symptoms. Pt reports that she does not get dizziness when she is lying in bed. Pt reports that her symptoms of dizziness are worse later in the day. Pt reports that she has seen Dr. Tami Ribas, ENT physician, in regards to her dizziness symptoms and reports that she had VNG testing performed. Pt reports the Dix-Hallpike testing was negative and that the VNG indicated potential central findings. Pt was referred to Dr. Tomi Likens, neurologist, for further follow-up and was prescribed Meclizine. Pt reports that she began to use a single-point cane in June upon recommendation by  Dr. Tami Ribas due to patient's balance deficits. Pt reports that she has had daily episodes of migraines since the age of 76. Pt states that she used to get migraine pain levels of 8-9/10 and reports that now her pain levels are 5/10 since starting on the newer migraine medications in 2018. Pt reports that most days she does have migraines but reports that they are significantly decreased in intensity, duration and frequency since starting the new medications. Pt states she has had 14 migraine free days during this last month and that the migraines were shorter in duration lasting a few hours and less severity. Pt reports that after lunch is when she is more prone to getting migraines and states that she tires more easily after lunch.  Pt reports that the dizziness symptoms do not coincide with her migraines. Pt states that in September 2020 she had 3 blood clots in her lungs and 3 blood clots in her leg.  Pt states that she used to walk 4 to 5 miles a day and was biking 30 minutes on multiple days a week.    Limitations Walking    Diagnostic tests MRA Head and Brain MRI: 1. No evidence of acute intracranial abnormality. Specifically, no  acute infarct.  2. No large vessel occlusion or proximal hemodynamically significant  stenosis.    Patient Stated Goals to have decreased dizziness and improved balance             Neuromuscular Re-education: Patient returns to clinic reporting that she has been doing very well.  Patient reports that for the first time in a year she was able to this past week go surf fishing.  Patient states that she finished 4 hours a day for 2 days in a row in the ocean surf and was able to do so without any dizziness symptoms.  She reports that this is the best that she has felt in a year and that she is no longer having any dizziness in the past 2 weeks.  Patient reports that she has not been able to trigger her dizziness anymore by doing activities that used to aggravate the  dizziness such as turning sideways at the kitchen counter.  Patient reports prior to that patient had been having dizziness since January.  She reports that she did not have to use her cane this past week.  Patient reports that she is very pleased with her progress.  FUNCTIONAL OUTCOME MEASURES:  Results Comments  DHI 0/100 Normal; low perception of handicap  ABC Scale 93.1% Safe for community mobility; decreased falls risk  DGI 22/24 Safe for community mobility  FOTO 70/100 Improved ability to perform functional tasks  Dizziness functional scale  67.4 Normal   Discussed functional outcome  measure testing and compared to prior testing.  Discussed progress towards goals and plan of care. Discussed discharge planning.  Patient reports that she plans on continuing to perform home exercise program upon discharge and that she has no questions or concerns in regards to her home exercise program.  Discussed Silver sneakers and community gyms as well.   Note: Portions of this document were prepared using Dragon voice recognition software and although reviewed may contain unintentional dictation errors in syntax, grammar, or spelling.    PT Education - 03/11/21 1751     Education Details Discussed functional outcome measure testing and compared to prior testing, discussed goals and progress, discussed discharge plans and home exercise program as well as community gyms and Silver sneakers.    Person(s) Educated Patient    Methods Explanation    Comprehension Verbalized understanding              PT Short Term Goals - 03/10/21 0940       PT SHORT TERM GOAL #1   Title Pt will be independent with HEP in order to improve balance and decrease dizziness symptoms in order to decrease fall risk and improve function at home and in the community    Time 4    Period Weeks    Status Achieved    Target Date 02/18/21               PT Long Term Goals - 03/10/21 0941       PT LONG TERM GOAL  #1   Title Patient will have improved FOTO score of 6 points or greater in order to demonstrate improvements in patient's ADLs and functional performance.    Baseline Scored 45/100 on 01/21/2021; 70/100    Time 12    Period Weeks    Status Achieved      PT LONG TERM GOAL #2   Title Patient will demonstrate reduced falls risk as evidenced by Dynamic Gait Index (DGI) >19/24.    Baseline Scored 11/24 on 04/15/2021; 22/24    Time 12    Period Weeks    Status Achieved      PT LONG TERM GOAL #3   Title Patient will reduce falls risk as indicated by Activities Specific Balance Confidence Scale (ABC) >67%.    Baseline Scored 32.5% on 01/21/2021; 93%    Time 12    Period Weeks    Status Achieved      PT LONG TERM GOAL #4   Title Patient will reduce perceived disability to low levels as indicated by <40 on Dizziness Handicap Inventory.    Baseline Scored 56/100 moderate perception of handicap on 01/21/2021; 0/100    Time 12    Period Weeks    Status Achieved                    Patient will benefit from skilled therapeutic intervention in order to improve the following deficits and impairments:     Visit Diagnosis: Dizziness and giddiness  Unsteadiness on feet     Problem List Patient Active Problem List   Diagnosis Date Noted   Nausea 08/15/2020   Thyroid nodule 04/11/2020   Right leg pain 04/11/2020   Leg weakness, bilateral 10/30/2019   Situational anxiety 10/12/2019   AK (actinic keratosis) 09/26/2019   Dyspnea on minimal exertion 07/13/2019   Stuttering 07/13/2019   Hypertension associated with diabetes ([place]) 07/06/2019   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[place]) 07/06/2019   Multiple thyroid nodules  03/27/2019   DVT, lower extremity, proximal, acute, right ([place]) 03/13/2019   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[place]) 03/01/2019   Leukocytosis 03/01/2019   Breast nodule 03/01/2019   Abnormal renal finding 06/29/2018   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   Facial rash 03/01/2017   Eustachian tube disorder, bilateral 12/16/2016   Lightheadedness 12/16/2016   Esophageal dysphagia 11/22/2016   Chronic low back pain 10/07/2016   Vision changes 08/12/2016   Aortic atherosclerosis ([place]) 06/11/2016   Supraclavicular fossa fullness 06/08/2016   Family history of aortic aneurysm 08/20/2014   Vitamin D deficiency 08/20/2014   Type 2 diabetes mellitus with hyperglycemia ([place]) 01/08/2011   Osteopenia 11/06/2009   Hyperlipidemia associated with type 2 diabetes mellitus ([place]) 07/05/2008   INSOMNIA, CHRONIC 07/05/2008   Migraine with aura 07/05/2008   GERD 07/05/2008   OSTEOARTHRITIS 07/05/2008   MITRAL VALVE PROLAPSE, HX OF 07/05/2008   NEPHROLITHIASIS, HX OF 07/05/2008     Plan - 03/11/21 1802     Clinical Impression Statement Patient returns to the clinic reporting that she has been doing very well and has had no further dizziness episodes in over 2 weeks.  In addition patient reports that for the first time in a year she was able to go surf fishing in the ocean for 4 hours a day for 2 days without dizziness symptoms.  Patient also reports improvements in her balance and that she did not use a cane this past week.  Repeated functional outcome testing this date.  Patient improved from 32% to 93% on the ABC scale indicating decreased falls risk.  Patient improved from 11/24-22/24 on the dynamic gait index indicating safe for community mobility.  Patient improved from 45/100-70/100 on the FOTO Outcome measure indicating improved ability to perform functional tasks.  Patient improved from moderate perception of handicap 56/100 down to low perception of handicap 0/100 on the dizziness handicap inventory which is a normal score indicates that she is no longer having dizziness in her daily life.  Patient is independent with home exercise program and has no questions or concerns.  Patient has met all short and long-term goals has set on plan of  care.  And is in agreement with discharge from vestibular PT services at this time.  Will discharge patient at this time with patient planning on continuing home exercise program upon discharge.    Personal Factors and Comorbidities Comorbidity 3+;Time since onset of injury/illness/exacerbation    Comorbidities Migraines, hypertension, type 2 diabetes, bilateral eustachian tube disorder    Examination-Activity Limitations Bend;Bathing;Locomotion Level;Stairs;Transfers;Stand    Examination-Participation Restrictions Driving;Community Activity    Stability/Clinical Decision Making Evolving/Moderate complexity    Rehab Potential Good    PT Frequency 1x / week    PT Duration 12 weeks    PT Treatment/Interventions Canalith Repostioning;Balance training;Therapeutic exercise;Therapeutic activities;Stair training;Gait training;Functional mobility training;Neuromuscular re-education;Patient/family education;Vestibular    PT Next Visit Plan Review VOR x1, together and semitandem progressions with head turns on firm surface and body wall rolls    PT Home Exercise Plan Access Code: [email or web address]  URL: [email or web address]  Date: 01/21/2021  Prepared by: Lady Deutscher    Exercises  Seated VOR X1 exercise - 3 x daily - 7 x weekly - 1 sets - 3 reps - 30 second hold    Consulted and Agree with Plan of Care Patient             Lady Deutscher PT, DPT [phone]  Lady Deutscher, PT 03/11/2021, 5:54 PM  [place]  [place] [facility] SERVICES [place], [place], [address] Phone: [phone]   Fax:  [phone]  Name: Debra Leach MRN: [id number] Date of Birth: 1951-04-10

## 2021-03-17 ENCOUNTER — Ambulatory Visit: Payer: Medicare Other | Admitting: Physical Therapy

## 2021-03-23 ENCOUNTER — Encounter: Payer: Self-pay | Admitting: Nurse Practitioner

## 2021-03-23 ENCOUNTER — Telehealth (INDEPENDENT_AMBULATORY_CARE_PROVIDER_SITE_OTHER): Payer: Medicare Other | Admitting: Nurse Practitioner

## 2021-03-23 VITALS — BP 131/75 | HR 91 | Temp 100.4°F | Ht 60.0 in | Wt 162.0 lb

## 2021-03-23 DIAGNOSIS — U071 COVID-19: Secondary | ICD-10-CM | POA: Insufficient documentation

## 2021-03-23 MED ORDER — MOLNUPIRAVIR EUA 200MG CAPSULE: 4.0000 | ORAL_CAPSULE | Freq: Two times a day (BID) | ORAL | 0 refills | Status: AC

## 2021-03-23 NOTE — Progress Notes (Signed)
Patient ID: Debra Leach, female    DOB: August 20, 1950, 70 y.o.   MRN: [id number]  Virtual visit completed through [place], a video enabled telemedicine application. Due to national recommendations of social distancing due to COVID-19, a virtual visit is felt to be most appropriate for this patient at this time. Reviewed limitations, risks, security and privacy concerns of performing a virtual visit and the availability of in person appointments. I also reviewed that there may be a patient responsible charge related to this service. The patient agreed to proceed.   Patient location: home Provider location: [place] at [facility], office Persons participating in this virtual visit: patient, provider   If any vitals were documented, they were collected by patient at home unless specified below.    BP 131/75   Pulse 91   Temp (!) 100.4 F (38 C) (Oral)   Ht 5' (1.524 m)   Wt 162 lb (73.5 kg)   BMI 31.64 kg/m    CC:  Subjective:   HPI: Debra Leach is a 70 y.o. female presenting on 03/23/2021 for Covid Positive (Symptoms started Friday had positive home test. Known exposure last Wednesday )    Symptoms 03/20/2021 Test 03/21/2021 negative, today was positive Pfizer x2 and 2 booster Has been using tylenol with pain  [place] DM unsure if it helps    Relevant past medical, surgical, family and social history reviewed and updated as indicated. Interim medical history since our last visit reviewed. Allergies and medications reviewed and updated. Outpatient Medications Prior to Visit  Medication Sig Dispense Refill   atorvastatin (LIPITOR) 80 MG tablet TAKE 1 TABLET BY MOUTH DAILY. 90 tablet 3   Blood Glucose Monitoring Suppl (ONETOUCH VERIO REFLECT) w/Device KIT 2 (two) times daily. for testing     budesonide (ENTOCORT EC) 3 MG 24 hr capsule Take 2 capsules by mouth daily.     cholecalciferol (VITAMIN D) 1000 units tablet Take 1,000 Units by mouth daily.      colesevelam (WELCHOL) 625 MG tablet TAKE 3 TABLETS BY MOUTH 2 TIMES DAILY WITH A MEAL. 540 tablet 3   ELIQUIS 2.5 MG TABS tablet TAKE 1 TABLET BY MOUTH 2 TIMES DAILY. 180 tablet 0   Eptinezumab-jjmr (VYEPTI) 100 MG/ML injection Inject 300 mg into the vein.     gabapentin (NEURONTIN) 100 MG capsule Take 100 mg by mouth 3 (three) times daily as needed.     gabapentin (NEURONTIN) 300 MG capsule Take 2 capsules (600 mg total) by mouth at bedtime. 180 capsule 0   insulin glargine (LANTUS SOLOSTAR) 100 UNIT/ML Solostar Pen INJECT 55 UNITS INTO THE SKIN DAILY 60 mL 1   Insulin Pen Needle (ULTICARE SHORT PEN NEEDLES) 31G X 8 MM MISC USE TO INJECT INSULIN DAILY 100 each 3   Lancets (ONETOUCH DELICA PLUS [id number]) MISC USE TO CHECK BLOOD SUGAR TWO TIMES DAILY 100 each 12   losartan-hydrochlorothiazide (HYZAAR) 50-12.5 MG tablet Take 1 tablet by mouth daily. 90 tablet 3   omeprazole (PRILOSEC) 20 MG capsule Take 20 mg by mouth 2 (two) times daily before a meal.     ONETOUCH VERIO test strip USE TO CHECK BLOOD SUGAR TWO TIMES A DAY 100 strip 12   potassium chloride SA (KLOR-CON) 20 MEQ tablet Take 1 tablet (20 mEq total) by mouth daily. 90 tablet 3   promethazine (PHENERGAN) 12.5 MG tablet Take 12.5 mg by mouth every 6 (six) hours as needed for nausea or vomiting.     vitamin B-12 (  CYANOCOBALAMIN) 1000 MCG tablet Take 1 tablet by mouth daily.     UBRELVY 100 MG TABS TAKE 1 TABLET BY MOUTH AS NEEDED (MAY REPEAT DOSE AFTER 2 HOURS IF NEEDED. MAXIMUM 2 TABLETS IN 24 HOURS). 10 tablet 3   No facility-administered medications prior to visit.     Per HPI unless specifically indicated in ROS section below Review of Systems  Constitutional:  Positive for fever. Negative for chills and fatigue.  HENT:  Positive for congestion, sinus pressure, sneezing and sore throat.   Respiratory:  Positive for cough and shortness of breath (nothing new or worse).   Cardiovascular:  Negative for chest pain.  Gastrointestinal:   Negative for nausea and vomiting.  Musculoskeletal:  Negative for arthralgias and myalgias.  Neurological:  Positive for headaches.  Objective:  BP 131/75   Pulse 91   Temp (!) 100.4 F (38 C) (Oral)   Ht 5' (1.524 m)   Wt 162 lb (73.5 kg)   BMI 31.64 kg/m   Wt Readings from Last 3 Encounters:  03/23/21 162 lb (73.5 kg)  01/27/21 162 lb 8 oz (73.7 kg)  01/08/21 162 lb 6.4 oz (73.7 kg)       Physical exam: Gen: alert, NAD, not ill appearing Pulm: speaks in complete sentences without increased work of breathing Psych: normal mood, normal thought content      Results for orders placed or performed in visit on 01/27/21  POCT glycosylated hemoglobin (Hb A1C)  Result Value Ref Range   Hemoglobin A1C 8.4 (A) 4.0 - 5.6 %   HbA1c POC (<> result, manual entry)     HbA1c, POC (prediabetic range)     HbA1c, POC (controlled diabetic range)     Assessment & Plan:   Problem List Items Addressed This Visit       Other   COVID-19 - Primary    Take at home COVID test that was positive.  States she is exposed on Wednesday started having symptoms on Friday.  She has been vaccinated against COVID did discuss oral antiviral she does qualify for molnupiravir.  Did discuss this medication is emergency use authorized only.  After discussion and decision making decided to start medication therapy.  Did states she can take over-the-counter medications as needed for symptom management.  Did discuss signs and symptoms when she needs to be seen in the emergency care setting.      Relevant Medications   molnupiravir EUA (LAGEVRIO) 200 mg CAPS capsule     No orders of the defined types were placed in this encounter.  No orders of the defined types were placed in this encounter.   I discussed the assessment and treatment plan with the patient. The patient was provided an opportunity to ask questions and all were answered. The patient agreed with the plan and demonstrated an understanding of the  instructions. The patient was advised to call back or seek an in-person evaluation if the symptoms worsen or if the condition fails to improve as anticipated.  Follow up plan: No follow-ups on file.  Romilda Garret, NP

## 2021-03-23 NOTE — Assessment & Plan Note (Signed)
Take at home COVID test that was positive.  States she is exposed on Wednesday started having symptoms on Friday.  She has been vaccinated against COVID did discuss oral antiviral she does qualify for molnupiravir.  Did discuss this medication is emergency use authorized only.  After discussion and decision making decided to start medication therapy.  Did states she can take over-the-counter medications as needed for symptom management.  Did discuss signs and symptoms when she needs to be seen in the emergency care setting.

## 2021-03-24 ENCOUNTER — Encounter: Payer: Medicare Other | Admitting: Physical Therapy

## 2021-03-30 ENCOUNTER — Telehealth: Payer: Self-pay

## 2021-03-30 NOTE — Chronic Care Management (AMB) (Addendum)
Chronic Care Management Pharmacy Assistant   Name: Debra Leach  MRN: [id number] DOB: Apr 26, 1951  Reason for Encounter: Hypertension Disease State   Recent office visits:  03/23/21-Family Medicine- Telemedicine - At home test positive  Covid-19 -Start molnupiravir EUA 279m  Recent consult visits:  None since last CCM contact  Hospital visits:  None in previous 6 months  Medications: Outpatient Encounter Medications as of 03/30/2021  Medication Sig   atorvastatin (LIPITOR) 80 MG tablet TAKE 1 TABLET BY MOUTH DAILY.   Blood Glucose Monitoring Suppl (ONETOUCH VERIO REFLECT) w/Device KIT 2 (two) times daily. for testing   budesonide (ENTOCORT EC) 3 MG 24 hr capsule Take 2 capsules by mouth daily.   cholecalciferol (VITAMIN D) 1000 units tablet Take 1,000 Units by mouth daily.   colesevelam (WELCHOL) 625 MG tablet TAKE 3 TABLETS BY MOUTH 2 TIMES DAILY WITH A MEAL.   ELIQUIS 2.5 MG TABS tablet TAKE 1 TABLET BY MOUTH 2 TIMES DAILY.   Eptinezumab-jjmr (VYEPTI) 100 MG/ML injection Inject 300 mg into the vein.   gabapentin (NEURONTIN) 100 MG capsule Take 100 mg by mouth 3 (three) times daily as needed.   gabapentin (NEURONTIN) 300 MG capsule Take 2 capsules (600 mg total) by mouth at bedtime.   insulin glargine (LANTUS SOLOSTAR) 100 UNIT/ML Solostar Pen INJECT 55 UNITS INTO THE SKIN DAILY   Insulin Pen Needle (ULTICARE SHORT PEN NEEDLES) 31G X 8 MM MISC USE TO INJECT INSULIN DAILY   Lancets (ONETOUCH DELICA PLUS [id number] MISC USE TO CHECK BLOOD SUGAR TWO TIMES DAILY   losartan-hydrochlorothiazide (HYZAAR) 50-12.5 MG tablet Take 1 tablet by mouth daily.   omeprazole (PRILOSEC) 20 MG capsule Take 20 mg by mouth 2 (two) times daily before a meal.   ONETOUCH VERIO test strip USE TO CHECK BLOOD SUGAR TWO TIMES A DAY   potassium chloride SA (KLOR-CON) 20 MEQ tablet Take 1 tablet (20 mEq total) by mouth daily.   promethazine (PHENERGAN) 12.5 MG tablet Take 12.5 mg by mouth every 6  (six) hours as needed for nausea or vomiting.   UBRELVY 100 MG TABS TAKE 1 TABLET BY MOUTH AS NEEDED (MAY REPEAT DOSE AFTER 2 HOURS IF NEEDED. MAXIMUM 2 TABLETS IN 24 HOURS).   vitamin B-12 (CYANOCOBALAMIN) 1000 MCG tablet Take 1 tablet by mouth daily.   No facility-administered encounter medications on file as of 03/30/2021.    Recent Office Vitals: BP Readings from Last 3 Encounters:  03/23/21 131/75  01/27/21 130/76  01/08/21 (!) 166/81   Pulse Readings from Last 3 Encounters:  03/23/21 91  01/27/21 89  01/08/21 92    Wt Readings from Last 3 Encounters:  03/23/21 162 lb (73.5 kg)  01/27/21 162 lb 8 oz (73.7 kg)  01/08/21 162 lb 6.4 oz (73.7 kg)     Kidney Function Lab Results  Component Value Date/Time   CREATININE 1.07 10/22/2020 07:33 AM   CREATININE 1.07 07/30/2020 08:57 AM   CREATININE 0.84 06/08/2016 10:32 AM   GFR 53.01 (L) 10/22/2020 07:33 AM   GFRNONAA >60 04/07/2020 09:55 AM   GFRAA >60 11/21/2019 11:36 AM    BMP Latest Ref Rng & Units 10/22/2020 07/30/2020 07/09/2020  Glucose 70 - 99 mg/dL 130(H) 143(H) 47(LL)  BUN 6 - 23 mg/dL '17 16 14  ' Creatinine 0.40 - 1.20 mg/dL 1.07 1.07 1.05  BUN/Creat Ratio 6 - 22 Ratio - - -  Sodium 135 - 145 mEq/L 142 138 143  Potassium 3.5 - 5.1 mEq/L 3.7 3.5 3.3(L)  Chloride 96 - 112 mEq/L 102 100 103  CO2 19 - 32 mEq/L 30 31 34(H)  Calcium 8.4 - 10.5 mg/dL 9.3 9.2 9.3     Contacted patient on 03/31/21 to discuss hypertension disease state  Current antihypertensive regimen:    Losartan-HCTZ 50-12.5. 1 tablet daily   Patient verbally confirms she is taking the above medications as directed. Yes  How often are you checking your Blood Pressure? daily The patient reports she has not taken her BP in the week due to having covid-19.   she checks her blood pressure in the morning before taking her medication.  Current home BP readings:  prior to Covid-19  131/75 ,136/80,130/76  She only had a few readings due to not feeling well  as she had covid-19  Wrist or arm cuff:arm Salt intake:limits adding to meals  OTC medications including pseudoephedrine or NSAIDs? She is on budesonide 6 mg daily, took a few tylenol during her covid-19 experience.   Any readings above 180/120? No   What recent interventions/DTPs have been made by any provider to improve Blood Pressure control since last CPP Visit: none identified  Any recent hospitalizations or ED visits since last visit with CPP? No  What diet changes have been made to improve Blood Pressure Control?  The patient reports eating small portions and avoiding salt.  What exercise is being done to improve your Blood Pressure Control?   None at this time due to having covid-19  Adherence Review: Is the patient currently on ACE/ARB medication? Yes Does the patient have >5 day gap between last estimated fill dates? No   Star Rating Drugs:  Medication:   Last Fill: Day Supply Losartan HTZ 50-12.55m        01/14/2021      90 Atorvastatin 840m                  01/14/2021      90   Care Gaps: Annual wellness visit in last year? Yes Most Recent BP reading: 130/76  89-P 01/27/21  If Diabetic: Most recent A1C reading:  8.4 01/27/21 Last eye exam / retinopathy screening: 12/18/20 Last diabetic foot exam: 10/24/20  PCP appointment on 05/08/21  MiDebbora DusCPP notified  VeAvel SensorCCJersey Villagessistant [phone] have reviewed the care management and care coordination activities outlined in this encounter and I am certifying that I agree with the content of this note. No further action required.  MiDebbora DusPharmD Clinical Pharmacist [phone]

## 2021-04-07 ENCOUNTER — Ambulatory Visit
Admission: RE | Admit: 2021-04-07 | Discharge: 2021-04-07 | Disposition: A | Discharge status: Home / Self Care | Payer: Medicare Other | Source: Ambulatory Visit | Attending: Emergency Medicine | Admitting: Emergency Medicine

## 2021-04-07 ENCOUNTER — Other Ambulatory Visit: Payer: Self-pay

## 2021-04-07 ENCOUNTER — Encounter: Payer: Medicare Other | Admitting: Physical Therapy

## 2021-04-07 VITALS — BP 150/69 | HR 110 | Temp 99.3°F | Resp 18

## 2021-04-07 DIAGNOSIS — L03116 Cellulitis of left lower limb: Secondary | ICD-10-CM | POA: Diagnosis not present

## 2021-04-07 DIAGNOSIS — L089 Local infection of the skin and subcutaneous tissue, unspecified: Secondary | ICD-10-CM

## 2021-04-07 DIAGNOSIS — T148XXA Other injury of unspecified body region, initial encounter: Secondary | ICD-10-CM | POA: Diagnosis not present

## 2021-04-07 HISTORY — DX: Other chronic pancreatitis: K86.1

## 2021-04-07 MED ORDER — DOXYCYCLINE HYCLATE 100 MG PO CAPS: 100.0000 mg | ORAL_CAPSULE | Freq: Two times a day (BID) | ORAL | 0 refills | Status: AC

## 2021-04-07 NOTE — Discharge Instructions (Addendum)
Take the antibiotic as directed.  Keep your wound clean and dry.  Wash it gently with soap and water twice a day; then apply an antibiotic ointment and bandage.  Follow-up with your primary care provider for wound recheck in 2 days.

## 2021-04-07 NOTE — ED Triage Notes (Signed)
Pt presents with would to LLE 10/20.  Went to [facility] 10/21 and it was cleaned and tegaderm applied.  Was not given abx.  Approx 5 days ago started noticing redness spreading as well as swelling around would and distal to it.  Area warm to touch.  No fevers.

## 2021-04-07 NOTE — ED Provider Notes (Signed)
[facility]    CSN: [id number] Arrival date & time: 04/07/21  1012      History   Chief Complaint Chief Complaint  Patient presents with   Leg Injury    L APPT 1030     HPI Debra Leach is a 70 y.o. female.  Patient presents with redness and swelling of her left lower leg after injuring it 2 weeks ago; she scraped it on the running board of a vehicle.  She states the redness has improved some but the swelling has gotten worse.  No wound drainage.  No fever or chills.  No numbness, weakness, paresthesias, or other symptoms.  She states she was seen at [facility] the day after the injury on 03/27/2021 but was not treated with an antibiotic.  Her medical history includes diabetes, hypertension, mitral valve prolapse, pulmonary embolism and infarction, DVT, migraine headaches, chronic low back pain.  Patient tested positive for COVID at home on 03/23/2021.  She was treated with molnupiravir by her PCP at that time.  Last tetanus Nov 2017 per patient.   The history is provided by the patient and medical records.   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[place])    Clotting disorder (HCC)    Complication of anesthesia    difficult waking   Diabetes mellitus without complication (HCC)    Diet and exercise controlled   GERD (gastroesophageal reflux disease)    Headache    History of kidney stones    Hyperlipidemia    Hypertension    MVP (mitral valve prolapse)    history of   PONV (postoperative nausea and vomiting)     Patient Active Problem List   Diagnosis Date Noted   COVID-19 03/23/2021   Nausea 08/15/2020   Thyroid nodule 04/11/2020   Right leg pain 04/11/2020   Leg weakness, bilateral 10/30/2019   Situational anxiety 10/12/2019   AK (actinic keratosis) 09/26/2019   Dyspnea on minimal exertion 07/13/2019   Stuttering 07/13/2019   Hypertension associated  with diabetes ([place]) 07/06/2019   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[place]) 07/06/2019   Multiple thyroid nodules 03/27/2019   DVT, lower extremity, proximal, acute, right ([place]) 03/13/2019   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[place]) 03/01/2019   Leukocytosis 03/01/2019   Breast nodule 03/01/2019   Abnormal renal finding 06/29/2018   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   Facial rash 03/01/2017   Eustachian tube disorder, bilateral 12/16/2016   Lightheadedness 12/16/2016   Esophageal dysphagia 11/22/2016   Chronic low back pain 10/07/2016   Vision changes 08/12/2016   Aortic atherosclerosis ([place]) 06/11/2016   Supraclavicular fossa fullness 06/08/2016   Family history of aortic aneurysm 08/20/2014   Vitamin D deficiency 08/20/2014   Type 2 diabetes mellitus with hyperglycemia ([place]) 01/08/2011   Osteopenia 11/06/2009   Hyperlipidemia associated with type 2 diabetes mellitus ([place]) 07/05/2008   INSOMNIA, CHRONIC 07/05/2008   Migraine with aura 07/05/2008   GERD 07/05/2008   OSTEOARTHRITIS 07/05/2008   MITRAL VALVE PROLAPSE, HX OF 07/05/2008   NEPHROLITHIASIS, HX OF 07/05/2008    Past Surgical History:  Procedure Laterality Date   arm surgery     BREAST BIOPSY Left    BREAST SURGERY  2000   breast biopsy (benign)   LITHOTRIPSY  08-12-2008   stent placed bilaterally   PARTIAL HYSTERECTOMY     Both ovaries remain, vaginal, for mennorhagia  TONSILLECTOMY     TUBAL LIGATION  1980    OB History   No obstetric history on file.      Home Medications    Prior to Admission medications   Medication Sig Start Date End Date Taking? Authorizing Provider  atorvastatin (LIPITOR) 80 MG tablet TAKE 1 TABLET BY MOUTH DAILY. 03/04/20  Yes Bedsole, Amy E, MD  Blood Glucose Monitoring Suppl (ONETOUCH VERIO REFLECT) w/Device KIT 2 (two) times daily. for testing 10/25/19  Yes [provider]  budesonide (ENTOCORT EC) 3 MG 24 hr capsule Take 2 capsules by mouth daily.  05/03/20  Yes [provider]  cholecalciferol (VITAMIN D) 1000 units tablet Take 1,000 Units by mouth daily.   Yes [provider]  colesevelam (WELCHOL) 625 MG tablet TAKE 3 TABLETS BY MOUTH 2 TIMES DAILY WITH A MEAL. 03/04/20  Yes Bedsole, Amy E, MD  doxycycline (VIBRAMYCIN) 100 MG capsule Take 1 capsule (100 mg total) by mouth 2 (two) times daily for 7 days. 04/07/21 04/14/21 Yes Sharion Balloon, NP  Eptinezumab-jjmr (VYEPTI) 100 MG/ML injection Inject 300 mg into the vein.   Yes [provider]  gabapentin (NEURONTIN) 100 MG capsule Take 100 mg by mouth 3 (three) times daily as needed.   Yes [provider]  gabapentin (NEURONTIN) 300 MG capsule Take 2 capsules (600 mg total) by mouth at bedtime. 07/31/20  Yes Jaffe, Adam R, DO  insulin glargine (LANTUS SOLOSTAR) 100 UNIT/ML Solostar Pen INJECT 55 UNITS INTO THE SKIN DAILY 02/19/21  Yes Bedsole, Amy E, MD  Insulin Pen Needle (ULTICARE SHORT PEN NEEDLES) 31G X 8 MM MISC USE TO INJECT INSULIN DAILY 02/19/21  Yes Bedsole, Amy E, MD  Lancets (ONETOUCH DELICA PLUS [id number]) MISC USE TO CHECK BLOOD SUGAR TWO TIMES DAILY 05/07/20  Yes Bedsole, Amy E, MD  losartan-hydrochlorothiazide (HYZAAR) 50-12.5 MG tablet Take 1 tablet by mouth daily. 10/24/20  Yes Bedsole, Amy E, MD  omeprazole (PRILOSEC) 20 MG capsule Take 20 mg by mouth 2 (two) times daily before a meal.   Yes [provider]  ONETOUCH VERIO test strip USE TO CHECK BLOOD SUGAR TWO TIMES A DAY 05/07/20  Yes Bedsole, Amy E, MD  potassium chloride SA (KLOR-CON) 20 MEQ tablet Take 1 tablet (20 mEq total) by mouth daily. 07/31/20  Yes Bedsole, Amy E, MD  promethazine (PHENERGAN) 12.5 MG tablet Take 12.5 mg by mouth every 6 (six) hours as needed for nausea or vomiting.   Yes [provider]  UBRELVY 100 MG TABS TAKE 1 TABLET BY MOUTH AS NEEDED (MAY REPEAT DOSE AFTER 2 HOURS IF NEEDED. MAXIMUM 2 TABLETS IN 24 HOURS). 03/27/20  Yes Jaffe, Adam R, DO  vitamin  B-12 (CYANOCOBALAMIN) 1000 MCG tablet Take 1 tablet by mouth daily. 08/15/20  Yes [provider]  ELIQUIS 2.5 MG TABS tablet TAKE 1 TABLET BY MOUTH 2 TIMES DAILY. 01/14/21   [name], MD    Family History Family History  Problem Relation Age of Onset   Cancer Mother        bone   Hypertension Father    Mitral valve prolapse Father    Asthma Brother    Arthritis Brother    Nephrolithiasis Brother    Nephrolithiasis Brother    Asthma Brother    Arthritis Brother    Aortic aneurysm Brother        ascending aortic aneuysm   Breast cancer Maternal Aunt    Breast cancer Maternal Aunt  Social History Social History   Tobacco Use   Smoking status: Former    Types: Cigarettes   Smokeless tobacco: Never  Vaping Use   Vaping Use: Never used  Substance Use Topics   Alcohol use: No   Drug use: No     Allergies   Atenolol, Codeine, Ibuprofen, Penicillins, Pentazocine lactate, Propranolol, Talwin [pentazocine], Crestor [rosuvastatin], and Sudafed [pseudoephedrine hcl]   Review of Systems Review of Systems  Constitutional:  Negative for chills and fever.  Respiratory:  Negative for cough and shortness of breath.   Cardiovascular:  Negative for chest pain and palpitations.  Musculoskeletal:  Negative for gait problem.  Skin:  Positive for color change and wound.  Neurological:  Negative for weakness and numbness.  All other systems reviewed and are negative.   Physical Exam Triage Vital Signs ED Triage Vitals  Enc Vitals Group     BP      Pulse      Resp      Temp      Temp src      SpO2      Weight      Height      Head Circumference      Peak Flow      Pain Score      Pain Loc      Pain Edu?      Excl. in [place]?    No data found.  Updated Vital Signs BP (!) 150/69 (BP Location: Left Arm)   Pulse (!) 110   Temp 99.3 F (37.4 C) (Oral)   Resp 18   SpO2 97%   Visual Acuity Right Eye Distance:   Left Eye Distance:   Bilateral Distance:     Right Eye Near:   Left Eye Near:    Bilateral Near:     Physical Exam Vitals and nursing note reviewed.  Constitutional:      General: She is not in acute distress.    Appearance: She is well-developed.  HENT:     Head: Normocephalic and atraumatic.     Mouth/Throat:     Mouth: Mucous membranes are moist.  Eyes:     Conjunctiva/sclera: Conjunctivae normal.  Cardiovascular:     Rate and Rhythm: Normal rate and regular rhythm.     Heart sounds: Normal heart sounds.  Pulmonary:     Effort: Pulmonary effort is normal. No respiratory distress.     Breath sounds: Normal breath sounds.  Abdominal:     Palpations: Abdomen is soft.     Tenderness: There is no abdominal tenderness.  Musculoskeletal:        General: Swelling present. Normal range of motion.     Cervical back: Neck supple.  Skin:    General: Skin is warm and dry.     Capillary Refill: Capillary refill takes less than 2 seconds.     Findings: Erythema and lesion present.     Comments: Large healing wound on left lower leg with surrounding erythema.  See pictures for details.  Neurological:     General: No focal deficit present.     Mental Status: She is alert and oriented to person, place, and time.     Sensory: No sensory deficit.     Motor: No weakness.     Gait: Gait normal.  Psychiatric:        Mood and Affect: Mood normal.        Behavior: Behavior normal.  UC Treatments / Results  Labs (all labs ordered are listed, but only abnormal results are displayed) Labs Reviewed - No data to display  EKG   Radiology No results found.  Procedures Procedures (including critical care time)  Medications Ordered in UC Medications - No data to display  Initial Impression / Assessment and Plan / UC Course  I have reviewed the triage vital signs and the nursing notes.  Pertinent labs & imaging results that were available during my care of the patient were reviewed by me and considered in my medical  decision making (see chart for details).  Cellulitis of left lower leg, wound infection.  Treating with doxycycline.  Tetanus is up-to-date.  Wound care instructions and signs of worsening infection discussed with patient.  Education provided on cellulitis.  Instructed patient to follow-up with her PCP for a wound recheck in 2 days.  She agrees to plan of care.   Final Clinical Impressions(s) / UC Diagnoses   Final diagnoses:  Cellulitis of left lower leg  Wound infection     Discharge Instructions      Take the antibiotic as directed.  Keep your wound clean and dry.  Wash it gently with soap and water twice a day; then apply an antibiotic ointment and bandage.  Follow-up with your primary care provider for wound recheck in 2 days.     ED Prescriptions     Medication Sig Dispense Auth. Provider   doxycycline (VIBRAMYCIN) 100 MG capsule Take 1 capsule (100 mg total) by mouth 2 (two) times daily for 7 days. 14 capsule Sharion Balloon, NP      PDMP not reviewed this encounter.   Sharion Balloon, NP 04/07/21 [phone]

## 2021-04-09 ENCOUNTER — Ambulatory Visit (INDEPENDENT_AMBULATORY_CARE_PROVIDER_SITE_OTHER): Payer: Medicare Other | Admitting: Family Medicine

## 2021-04-09 ENCOUNTER — Other Ambulatory Visit: Payer: Self-pay

## 2021-04-09 ENCOUNTER — Encounter: Payer: Self-pay | Admitting: Family Medicine

## 2021-04-09 VITALS — BP 158/92 | HR 110 | Temp 97.7°F | Ht 60.0 in | Wt 160.0 lb

## 2021-04-09 DIAGNOSIS — Z23 Encounter for immunization: Secondary | ICD-10-CM | POA: Diagnosis not present

## 2021-04-09 DIAGNOSIS — L03116 Cellulitis of left lower limb: Secondary | ICD-10-CM | POA: Diagnosis not present

## 2021-04-09 DIAGNOSIS — S81802D Unspecified open wound, left lower leg, subsequent encounter: Secondary | ICD-10-CM | POA: Diagnosis not present

## 2021-04-09 NOTE — Addendum Note (Signed)
Addended by: Carter Kitten on: 04/09/2021 09:45 AM   Modules accepted: Orders

## 2021-04-09 NOTE — Progress Notes (Signed)
Debra Leach T. Debra Fiorenza, MD, [place] [facility] [place], [address]  Phone: [phone]  FAX: [phone]  Debra Leach - 70 y.o. female  MRN [id number]  Date of Birth: 04-17-1951  Date: 04/09/2021  PCP: [name], MD  Referral: [name], MD  Chief Complaint  Patient presents with   Wound Check    Urgent Care 04/07/21    This visit occurred during the SARS-CoV-2 public health emergency.  Safety protocols were in place, including screening questions prior to the visit, additional usage of staff PPE, and extensive cleaning of exam room while observing appropriate contact time as indicated for disinfecting solutions.   Subjective:   Debra Leach is a 70 y.o. very pleasant female patient with Body mass index is 31.25 kg/m. who presents with the following:  ER notes reviewed.  Now on doxy.  She was seen in the ER on April 07, 2021.  At that point her wound appeared to be infected and they placed her on doxycycline.  Since that time the redness and pain have reduced in size.  Her initial injury was approximately 2 weeks ago, and that time.  She and her husband also had COVID-19.  She was trying to help, particularly her husband who was ill.  Immunization History  Administered Date(s) Administered   Fluad Quad(high Dose 65+) 02/15/2019   Influenza Split 02/15/2012   Influenza Whole 04/24/2009, 02/26/2010   Influenza, High Dose Seasonal PF 02/28/2020, 04/03/2021   Influenza,inj,Quad PF,6+ Mos 02/15/2013, 02/19/2014, 02/21/2015, 02/24/2016, 03/22/2017, 02/07/2018   PFIZER(Purple Top)SARS-COV-2 Vaccination 06/29/2019, 07/20/2019, 03/07/2020, 10/09/2020   Pneumococcal Conjugate-13 05/18/2016   Pneumococcal Polysaccharide-23 02/21/2015, 07/15/2020   Td 06/07/2006   Tdap 02/24/2016   Zoster Recombinat (Shingrix) 04/12/2019, 08/15/2019   Zoster, Live 05/27/2011     Review of Systems  is noted in the HPI, as appropriate  Objective:   BP (!) 158/92   Pulse (!) 110   Temp 97.7 F (36.5 C) (Temporal)   Ht 5' (1.524 m)   Wt 160 lb (72.6 kg)   SpO2 98%   BMI 31.25 kg/m   GEN: No acute distress; alert,appropriate. PULM: Breathing comfortably in no respiratory distress PSYCH: Normally interactive.     Mildly tender to palpation around the wound, but there appears to be good granulation tissue.  Laboratory and Imaging Data:  Assessment and Plan:     ICD-10-CM   1. Left leg cellulitis  L03.116      Continue doxycycline.  This does appear better compared to images from 3 days ago, and the patient feels as if this is looking better.  There is good granulation tissue, I think that this will heal from secondary intention.  Tetanus booster today given risk of a moderate wound with an older person.  Indications 5 years.  No orders of the defined types were placed in this encounter.  Medications Discontinued During This Encounter  Medication Reason   insulin glargine (LANTUS SOLOSTAR) 100 UNIT/ML Solostar Pen Duplicate   No orders of the defined types were placed in this encounter.   Follow-up: No follow-ups on file.  Dr[employer] One speech-to-text software was used for transcription in this dictation.  Possible transcriptional errors can occur using Editor, commissioning.   Signed,  Maud Deed. Michaeal Davis, MD   Outpatient Encounter Medications as of 04/09/2021  Medication Sig   atorvastatin (LIPITOR) 80 MG tablet TAKE 1 TABLET BY MOUTH DAILY.   Blood  Glucose Monitoring Suppl (ONETOUCH VERIO REFLECT) w/Device KIT 2 (two) times daily. for testing   budesonide (ENTOCORT EC) 3 MG 24 hr capsule Take 2 capsules by mouth daily.   cholecalciferol (VITAMIN D) 1000 units tablet Take 1,000 Units by mouth daily.   colesevelam (WELCHOL) 625 MG tablet TAKE 3 TABLETS BY MOUTH 2 TIMES DAILY WITH A MEAL.   doxycycline (VIBRAMYCIN) 100 MG capsule Take 1 capsule (100 mg  total) by mouth 2 (two) times daily for 7 days.   ELIQUIS 2.5 MG TABS tablet TAKE 1 TABLET BY MOUTH 2 TIMES DAILY.   Eptinezumab-jjmr (VYEPTI) 100 MG/ML injection Inject 300 mg into the vein.   gabapentin (NEURONTIN) 100 MG capsule Take 100 mg by mouth 3 (three) times daily as needed.   gabapentin (NEURONTIN) 300 MG capsule Take 2 capsules (600 mg total) by mouth at bedtime.   insulin glargine (LANTUS) 100 UNIT/ML Solostar Pen Inject 60 Units into the skin daily.   Insulin Pen Needle (ULTICARE SHORT PEN NEEDLES) 31G X 8 MM MISC USE TO INJECT INSULIN DAILY   Lancets (ONETOUCH DELICA PLUS [id number]) MISC USE TO CHECK BLOOD SUGAR TWO TIMES DAILY   losartan-hydrochlorothiazide (HYZAAR) 50-12.5 MG tablet Take 1 tablet by mouth daily.   omeprazole (PRILOSEC) 20 MG capsule Take 20 mg by mouth 2 (two) times daily before a meal.   ONETOUCH VERIO test strip USE TO CHECK BLOOD SUGAR TWO TIMES A DAY   potassium chloride SA (KLOR-CON) 20 MEQ tablet Take 1 tablet (20 mEq total) by mouth daily.   promethazine (PHENERGAN) 12.5 MG tablet Take 12.5 mg by mouth every 6 (six) hours as needed for nausea or vomiting.   UBRELVY 100 MG TABS TAKE 1 TABLET BY MOUTH AS NEEDED (MAY REPEAT DOSE AFTER 2 HOURS IF NEEDED. MAXIMUM 2 TABLETS IN 24 HOURS).   vitamin B-12 (CYANOCOBALAMIN) 1000 MCG tablet Take 1 tablet by mouth daily.   [DISCONTINUED] insulin glargine (LANTUS SOLOSTAR) 100 UNIT/ML Solostar Pen INJECT 55 UNITS INTO THE SKIN DAILY (Patient taking differently: Inject 60 Units into the skin daily.)   No facility-administered encounter medications on file as of 04/09/2021.

## 2021-04-13 ENCOUNTER — Other Ambulatory Visit: Payer: Self-pay | Admitting: Family Medicine

## 2021-04-13 ENCOUNTER — Other Ambulatory Visit: Payer: Self-pay | Admitting: Neurology

## 2021-04-13 DIAGNOSIS — M545 Low back pain, unspecified: Secondary | ICD-10-CM

## 2021-04-24 ENCOUNTER — Other Ambulatory Visit: Payer: Self-pay | Admitting: Neurological Surgery

## 2021-04-24 DIAGNOSIS — M4316 Spondylolisthesis, lumbar region: Secondary | ICD-10-CM

## 2021-05-05 ENCOUNTER — Ambulatory Visit: Payer: Medicare Other | Admitting: Family Medicine

## 2021-05-07 ENCOUNTER — Ambulatory Visit
Admission: RE | Admit: 2021-05-07 | Discharge: 2021-05-07 | Disposition: A | Discharge status: Home / Self Care | Payer: Medicare Other | Source: Ambulatory Visit | Attending: Neurological Surgery | Admitting: Neurological Surgery

## 2021-05-07 ENCOUNTER — Other Ambulatory Visit: Payer: Self-pay

## 2021-05-07 DIAGNOSIS — M4316 Spondylolisthesis, lumbar region: Secondary | ICD-10-CM | POA: Insufficient documentation

## 2021-05-08 ENCOUNTER — Ambulatory Visit (INDEPENDENT_AMBULATORY_CARE_PROVIDER_SITE_OTHER): Payer: Medicare Other | Admitting: Family Medicine

## 2021-05-08 ENCOUNTER — Encounter: Payer: Self-pay | Admitting: Family Medicine

## 2021-05-08 VITALS — BP 140/78 | HR 105 | Temp 97.3°F | Ht 60.0 in | Wt 162.0 lb

## 2021-05-08 DIAGNOSIS — K861 Other chronic pancreatitis: Secondary | ICD-10-CM | POA: Diagnosis not present

## 2021-05-08 DIAGNOSIS — E1165 Type 2 diabetes mellitus with hyperglycemia: Secondary | ICD-10-CM

## 2021-05-08 DIAGNOSIS — E1159 Type 2 diabetes mellitus with other circulatory complications: Secondary | ICD-10-CM | POA: Diagnosis not present

## 2021-05-08 DIAGNOSIS — I152 Hypertension secondary to endocrine disorders: Secondary | ICD-10-CM

## 2021-05-08 DIAGNOSIS — E119 Type 2 diabetes mellitus without complications: Secondary | ICD-10-CM

## 2021-05-08 DIAGNOSIS — R252 Cramp and spasm: Secondary | ICD-10-CM | POA: Diagnosis not present

## 2021-05-08 LAB — POTASSIUM: Potassium: 3.7 mEq/L (ref 3.5–5.1)

## 2021-05-08 LAB — POCT GLYCOSYLATED HEMOGLOBIN (HGB A1C): Hemoglobin A1C: 9.5 % — AB (ref 4.0–5.6)

## 2021-05-08 LAB — MAGNESIUM: Magnesium: 2 mg/dL (ref 1.5–2.5)

## 2021-05-08 MED ORDER — ONETOUCH VERIO VI STRP: ORAL_STRIP | 3 refills | Status: DC

## 2021-05-08 MED ORDER — ONETOUCH DELICA PLUS LANCET30G MISC: 3 refills | Status: DC

## 2021-05-08 NOTE — Assessment & Plan Note (Signed)
boerderline control on losartan HCTZ 50/12.5 mg daily...  Follow at home and may need med increase if remaining elevated at next OV.

## 2021-05-08 NOTE — Progress Notes (Addendum)
Patient ID: Debra Leach, female    DOB: 1950-08-29, 70 y.o.   MRN: [id number]  This visit was conducted in person.  BP (!) 150/70   Pulse (!) 105   Temp (!) 97.3 F (36.3 C) (Temporal)   Ht 5' (1.524 m)   Wt 162 lb (73.5 kg)   SpO2 97%   BMI 31.64 kg/m    CC: Chief Complaint  Patient presents with   Diabetes    Subjective:   HPI: Debra Leach is a 70 y.o. female presenting on 05/08/2021 for Diabetes  Diabetes:   Worsened control on diabetes in last 3 months.. A1C up from 8.4.  On Lantus 60 Units daily Did not tolerate metformin due to GI upset.  On budesonide  6 mg moderate dose.. for auto immune pancreatitis... plan is to use this longterm. ( Cannot tolerate lower dose) Lab Results  Component Value Date   HGBA1C 9.5 (A) 05/08/2021  Using medications without difficulties: Hypoglycemic episodes: Hyperglycemic episodes: occ 260s Feet problems: Blood Sugars averaging: 106-172 eye exam within last year:  She has been dealing with reflux and occ emesis in middle  of night.. Dr Earlean Shawl   She has been having leg cramps... using pedialyte 4 oz  On Kdur 20 mEq.Marland Kitchen this helped her symptoms early in the year... now not as severe but still nightly.    Hypertension:  Not at goal  despite losartan HCTZ 50/12.5 mg daily  On recheck in office today 140/78.Marland Kitchen pt was rushed coming in. BP Readings from Last 3 Encounters:  05/08/21 (!) 150/70  04/09/21 (!) 158/92  04/07/21 (!) 150/69  Using medication without problems or lightheadedness:  none Chest pain with exertion:none Edema:none Short of breath:none Average home BPs: Other issues:   Relevant past medical, surgical, family and social history reviewed and updated as indicated. Interim medical history since our last visit reviewed. Allergies and medications reviewed and updated. Outpatient Medications Prior to Visit  Medication Sig Dispense Refill   atorvastatin (LIPITOR) 80 MG tablet TAKE 1 TABLET BY  MOUTH DAILY 90 tablet 1   Blood Glucose Monitoring Suppl (ONETOUCH VERIO REFLECT) w/Device KIT 2 (two) times daily. for testing     budesonide (ENTOCORT EC) 3 MG 24 hr capsule Take 2 capsules by mouth daily.     cholecalciferol (VITAMIN D) 1000 units tablet Take 1,000 Units by mouth daily.     colesevelam (WELCHOL) 625 MG tablet TAKE 3 TABLETS BY MOUTH 2 TIMES DAILY WITH A MEAL. 540 tablet 3   ELIQUIS 2.5 MG TABS tablet TAKE 1 TABLET BY MOUTH 2 TIMES DAILY. 180 tablet 0   Eptinezumab-jjmr (VYEPTI) 100 MG/ML injection Inject 300 mg into the vein.     gabapentin (NEURONTIN) 100 MG capsule Take 100 mg by mouth 3 (three) times daily as needed.     gabapentin (NEURONTIN) 300 MG capsule TAKE 2 CAPSULES (600 MG TOTAL) BY MOUTH AT BEDTIME. 180 capsule 0   insulin glargine (LANTUS) 100 UNIT/ML Solostar Pen Inject 60 Units into the skin daily.     Insulin Pen Needle (ULTICARE SHORT PEN NEEDLES) 31G X 8 MM MISC USE TO INJECT INSULIN DAILY 100 each 3   losartan-hydrochlorothiazide (HYZAAR) 50-12.5 MG tablet Take 1 tablet by mouth daily. 90 tablet 3   omeprazole (PRILOSEC) 20 MG capsule Take 20 mg by mouth 2 (two) times daily before a meal.     potassium chloride SA (KLOR-CON) 20 MEQ tablet Take 1 tablet (20 mEq total) by mouth daily.  90 tablet 3   promethazine (PHENERGAN) 12.5 MG tablet Take 12.5 mg by mouth every 6 (six) hours as needed for nausea or vomiting.     UBRELVY 100 MG TABS TAKE 1 TABLET BY MOUTH AS NEEDED (MAY REPEAT DOSE AFTER 2 HOURS IF NEEDED. MAXIMUM 2 TABLETS IN 24 HOURS). 10 tablet 3   vitamin B-12 (CYANOCOBALAMIN) 1000 MCG tablet Take 1 tablet by mouth daily.     Lancets (ONETOUCH DELICA PLUS [id number]) MISC USE TO CHECK BLOOD SUGAR TWO TIMES DAILY 100 each 12   ONETOUCH VERIO test strip USE TO CHECK BLOOD SUGAR TWO TIMES A DAY 100 strip 12   No facility-administered medications prior to visit.     Per HPI unless specifically indicated in ROS section below Review of Systems   Constitutional:  Negative for fatigue and fever.  HENT:  Negative for congestion.   Eyes:  Negative for pain.  Respiratory:  Negative for cough and shortness of breath.   Cardiovascular:  Negative for chest pain, palpitations and leg swelling.  Gastrointestinal:  Positive for nausea and vomiting. Negative for abdominal pain.  Genitourinary:  Negative for dysuria and vaginal bleeding.  Musculoskeletal:  Negative for back pain.  Neurological:  Positive for headaches. Negative for syncope and light-headedness.  Psychiatric/Behavioral:  Negative for dysphoric mood.   Objective:  BP (!) 150/70   Pulse (!) 105   Temp (!) 97.3 F (36.3 C) (Temporal)   Ht 5' (1.524 m)   Wt 162 lb (73.5 kg)   SpO2 97%   BMI 31.64 kg/m   Wt Readings from Last 3 Encounters:  05/08/21 162 lb (73.5 kg)  04/09/21 160 lb (72.6 kg)  03/23/21 162 lb (73.5 kg)      Physical Exam    Results for orders placed or performed in visit on 05/08/21  POCT glycosylated hemoglobin (Hb A1C)  Result Value Ref Range   Hemoglobin A1C 9.5 (A) 4.0 - 5.6 %   HbA1c POC (<> result, manual entry)     HbA1c, POC (prediabetic range)     HbA1c, POC (controlled diabetic range)      This visit occurred during the SARS-CoV-2 public health emergency.  Safety protocols were in place, including screening questions prior to the visit, additional usage of staff PPE, and extensive cleaning of exam room while observing appropriate contact time as indicated for disinfecting solutions.   COVID 19 screen:  No recent travel or known exposure to COVID19 The patient denies respiratory symptoms of COVID 19 at this time. The importance of social distancing was discussed today.   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  Follow at home and may need med increase if remaining elevated at next OV.      Type 2 diabetes mellitus with hyperglycemia (HCC) - Primary (Chronic)     Chronic, inadequate control.   GI now has patient on life long budesonide ( at lowest tolerated dose).. will have longterm insulin requirement.  Blood sugars fluctuate greatly both fasting and post prandial.  Likely minimal insulin production by pancreas at this time.  I am hesitant to increase Lantus  or add fating acting insulin given extreme blood sugar fluctuations and risk of hypoglycemia.   At this point given intolerant of metfomrin and inadequate control.. will refer to ENDO to set up on additional meds, possibly mealtime insulin  Per SSI vs additional inj/oral meds.      Relevant Medications   glucose blood (ONETOUCH VERIO) test strip   Lancets (ONETOUCH DELICA PLUS [id number]) MISC   Other Relevant Orders   POCT glycosylated hemoglobin (Hb A1C) (Completed)   Ambulatory referral to Endocrinology   Leg cramps    Stop pedicalyte as this has a lot of glucose.  Will check K and Mg.  She states she already drinks a lot of water.      Relevant Orders   Potassium   Magnesium   Other Visit Diagnoses     Diabetes mellitus with no complication (HCC)       Relevant Medications   glucose blood (ONETOUCH VERIO) test strip   Lancets (ONETOUCH DELICA PLUS [id number]) MISC      Meds ordered this encounter  Medications   glucose blood (ONETOUCH VERIO) test strip    Sig: USE TO CHECK BLOOD SUGAR TWO TIMES A DAY    Dispense:  200 strip    Refill:  3   Lancets (ONETOUCH DELICA PLUS [id number]) MISC    Sig: USE TO CHECK BLOOD SUGAR TWO TIMES DAILY    Dispense:  200 each    Refill:  3       Eliezer Lofts, MD

## 2021-05-08 NOTE — Assessment & Plan Note (Signed)
Chronic, inadequate control.   GI now has patient on life long budesonide ( at lowest tolerated dose).. will have longterm insulin requirement.  Blood sugars fluctuate greatly both fasting and post prandial.  Likely minimal insulin production by pancreas at this time.  I am hesitant to increase Lantus or add fating acting insulin given extreme blood sugar fluctuations and risk of hypoglycemia.   At this point given intolerant of metfomrin and inadequate control.. will refer to ENDO to set up on additional meds, possibly mealtime insulin  Per SSI vs additional inj/oral meds.

## 2021-05-08 NOTE — Patient Instructions (Addendum)
Please stop at the lab to have labs drawn. We will set up a referral to an Endocrinologist.  Try to walk and stretch instead of Pedialyte.  Push water intake in AM.

## 2021-05-08 NOTE — Assessment & Plan Note (Signed)
Stop pedicalyte as this has a lot of glucose.  Will check K and Mg.  She states she already drinks a lot of water.

## 2021-05-08 NOTE — Assessment & Plan Note (Signed)
Unable to tolerate less than 6 mg of budesonide and even on this dose she has chronic nausea and occ emesis.

## 2021-05-26 ENCOUNTER — Encounter: Payer: Self-pay | Admitting: *Deleted

## 2021-06-16 ENCOUNTER — Telehealth: Payer: Self-pay

## 2021-06-16 ENCOUNTER — Encounter: Payer: Self-pay | Admitting: Family Medicine

## 2021-06-16 NOTE — Telephone Encounter (Signed)
New message   [employer] is unable to complete your request at this time. Please see more information at the bottom of the page.  Tyanna Frater KeyJaclyn Shaggy - PA Case ID: [id number] Need help? Call us at [phone] Outcome Additional Information Required This medication or product was previously approved on [id number] from 2021-06-07 to 2022-06-06. **Please note: This request was submitted electronically. Formulary lowering, tiering exception, cost reduction and/or pre-benefit determination review (including prospective Medicare hospice reviews) requests cannot be requested using this method of submission. Providers contact us at [phone] for further assistance. Drug Roselyn Meier 100MG  tablets Form [employer] Medicare Part D Electronic Prior Authorization Form (2017 [place]CP[place])

## 2021-06-17 NOTE — Telephone Encounter (Signed)
Referral re-routed to [facility] per patient request.  No new order needed.

## 2021-06-29 ENCOUNTER — Telehealth: Payer: Medicare Other

## 2021-07-07 NOTE — Progress Notes (Signed)
Name: Debra Leach  MRN/ DOB: [id number], 07/08/1950   Age/ Sex: 71 y.o., female    PCP: [name], MD   Reason for Endocrinology Evaluation: Type 2 Diabetes Mellitus     Date of Initial Endocrinology Visit: 07/10/2021     PATIENT IDENTIFIER: [name] is a 71 y.o. female with a past medical history of DM, renal stones, Autoimmune pancreatitis . The patient presented for initial endocrinology clinic visit on 07/10/2021 for consultative assistance with her diabetes management.    HPI: [name] was    Diagnosed with DM 2012 Prior Medications tried/Intolerance: Metformin- GI side effects .  Currently checking blood sugars 2 x / day Hypoglycemia episodes : yes               Symptoms: Yes                 Frequency: 1/ week Hemoglobin A1c has ranged from 6.7% in 2019, peaking at 9.5% in 2022. Patient required assistance for hypoglycemia:  Patient has required hospitalization within the last 1 year from hyper or hypoglycemia:   In terms of diet, the patient eats 3 meals a day , snacks mid morning ( apple/ crackers/cheese), mid afternoon and at times at bedtime    Has limited diet due to migraine headaches she has to eat every 3 hours otherwise her headaches will worsen      She is on long-term glucocorticoids for autoimmune pancreatitis for a long term  started  on 08/2017   Has constant nausea .   She was also diagnosed with MNG a few years ago with last ultrasound 04/2020    HOME DIABETES REGIMEN: Lantus 60 units daily    Statin: INtolerant to crestor  ACE-I/ARB: yes Prior Diabetic Education: no    GLUCOSE LOG:         DIABETIC COMPLICATIONS: Microvascular complications:  CKDIII Denies: Neuropathy, retinopathy Last eye exam: Completed   Macrovascular complications:   Denies: CAD, PVD, CVA   PAST HISTORY: Past Medical History:  Past Medical History:  Diagnosis Date   Anemia    Arthritis    osteoarthritis B knees,  left hip , right elbow   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[place])    Clotting disorder ([place])    Complication of anesthesia    difficult waking   Diabetes mellitus without complication (HCC)    Diet and exercise controlled   GERD (gastroesophageal reflux disease)    Headache    History of kidney stones    Hyperlipidemia    Hypertension    MVP (mitral valve prolapse)    history of   PONV (postoperative nausea and vomiting)    Past Surgical History:  Past Surgical History:  Procedure Laterality Date   arm surgery     BREAST BIOPSY Left    BREAST SURGERY  2000   breast biopsy (benign)   LITHOTRIPSY  08-12-2008   stent placed bilaterally   PARTIAL HYSTERECTOMY     Both ovaries remain, vaginal, for [place]    Social History:  reports that she has quit smoking. Her smoking use included cigarettes. She has never used smokeless tobacco. She reports that she does not drink alcohol and does not use drugs. Family History:  Family History  Problem Relation Age of Onset   Cancer Mother        bone   Hypertension Father    Mitral valve prolapse Father  Asthma Brother    Arthritis Brother    Nephrolithiasis Brother    Nephrolithiasis Brother    Asthma Brother    Arthritis Brother    Aortic aneurysm Brother        ascending aortic aneuysm   Breast cancer Maternal Aunt    Breast cancer Maternal Aunt      HOME MEDICATIONS: Allergies as of 07/10/2021       Reactions   Atenolol    Codeine    REACTION: Migraine   Ibuprofen    Penicillins Itching   Has patient had a PCN reaction causing immediate rash, facial/tongue/throat swelling, SOB or lightheadedness with hypotension: No Has patient had a PCN reaction causing severe rash involving mucus membranes or skin necrosis: No Has patient had a PCN reaction that required hospitalization: No Has patient had a PCN reaction occurring within the last 10 years: No If all of the above answers are "NO",  then may proceed with Cephalosporin use.   Pentazocine Lactate    REACTION: Swelling, itching, rash   Propranolol Other (See Comments)   Talwin [pentazocine] Other (See Comments)   Swelling and itching    Crestor [rosuvastatin] Other (See Comments)   Sudafed [pseudoephedrine Hcl] Itching, Anxiety        Medication List        Accurate as of July 10, 2021 10:26 AM. If you have any questions, ask your nurse or doctor.          STOP taking these medications    promethazine 12.5 MG tablet Commonly known as: PHENERGAN Stopped by: Dorita Sciara, MD       TAKE these medications    atorvastatin 80 MG tablet Commonly known as: LIPITOR TAKE 1 TABLET BY MOUTH DAILY   budesonide 3 MG 24 hr capsule Commonly known as: ENTOCORT EC Take 2 capsules by mouth daily.   cholecalciferol 1000 units tablet Commonly known as: VITAMIN D Take 1,000 Units by mouth daily.   colesevelam 625 MG tablet Commonly known as: WELCHOL TAKE 3 TABLETS BY MOUTH 2 TIMES DAILY WITH A MEAL.   Eliquis 2.5 MG Tabs tablet Generic drug: apixaban TAKE 1 TABLET BY MOUTH 2 TIMES DAILY.   empagliflozin 10 MG Tabs tablet Commonly known as: Jardiance Take 1 tablet (10 mg total) by mouth daily before breakfast. Started by: Dorita Sciara, MD   FreeStyle Libre 3 Sensor Misc 1 Device by Does not apply route every 14 (fourteen) days. Place 1 sensor on the skin every 14 days. Use to check glucose continuously Started by: Dorita Sciara, MD   gabapentin 100 MG capsule Commonly known as: NEURONTIN Take 100 mg by mouth 3 (three) times daily as needed.   gabapentin 300 MG capsule Commonly known as: NEURONTIN TAKE 2 CAPSULES (600 MG TOTAL) BY MOUTH AT BEDTIME.   insulin glargine 100 UNIT/ML Solostar Pen Commonly known as: LANTUS Inject 60 Units into the skin daily.   losartan-hydrochlorothiazide 50-12.5 MG tablet Commonly known as: HYZAAR Take 1 tablet by mouth daily.   omeprazole  20 MG capsule Commonly known as: PRILOSEC Take 20 mg by mouth 2 (two) times daily before a meal.   OneTouch Delica Plus [id number] Misc USE TO CHECK BLOOD SUGAR TWO TIMES DAILY   OneTouch Verio Reflect w/Device Kit 2 (two) times daily. for testing   OneTouch Verio test strip Generic drug: glucose blood USE TO CHECK BLOOD SUGAR TWO TIMES A DAY   potassium chloride SA 20 MEQ tablet Commonly known as: KLOR-CON  M Take 1 tablet (20 mEq total) by mouth daily.   Ubrelvy 100 MG Tabs Generic drug: Ubrogepant TAKE 1 TABLET BY MOUTH AS NEEDED (MAY REPEAT DOSE AFTER 2 HOURS IF NEEDED. MAXIMUM 2 TABLETS IN 24 HOURS).   UltiCare Short Pen Needles 31G X 8 MM Misc Generic drug: Insulin Pen Needle USE TO INJECT INSULIN DAILY   vitamin B-12 1000 MCG tablet Commonly known as: CYANOCOBALAMIN Take 1 tablet by mouth daily.   Vyepti 100 MG/ML injection Generic drug: Eptinezumab-jjmr Inject 300 mg into the vein.         ALLERGIES: Allergies  Allergen Reactions   Atenolol    Codeine     REACTION: Migraine   Ibuprofen    Penicillins Itching    Has patient had a PCN reaction causing immediate rash, facial/tongue/throat swelling, SOB or lightheadedness with hypotension: No Has patient had a PCN reaction causing severe rash involving mucus membranes or skin necrosis: No Has patient had a PCN reaction that required hospitalization: No Has patient had a PCN reaction occurring within the last 10 years: No If all of the above answers are "NO", then may proceed with Cephalosporin use.    Pentazocine Lactate     REACTION: Swelling, itching, rash   Propranolol Other (See Comments)   Talwin [Pentazocine] Other (See Comments)    Swelling and itching    Crestor [Rosuvastatin] Other (See Comments)   Sudafed [Pseudoephedrine Hcl] Itching and Anxiety     REVIEW OF SYSTEMS: A comprehensive ROS was conducted with the patient and is negative except as per HPI and below:  ROS    OBJECTIVE:    VITAL SIGNS: BP (!) 142/76 (BP Location: Left Arm, Patient Position: Sitting, Cuff Size: Small)    Pulse 80    Ht 5' (1.524 m)    Wt 161 lb (73 kg)    SpO2 99%    BMI 31.44 kg/m    PHYSICAL EXAM:  General: Pt appears well and is in NAD  Neck: General: Supple without adenopathy or carotid bruits. Thyroid: Thyroid size normal.  No goiter or nodules appreciated.   Lungs: Clear with good BS bilat with no rales, rhonchi, or wheezes  Heart: RRR with normal S1 and S2 and no gallops; no murmurs; no rub  Abdomen: Normoactive bowel sounds, soft, nontender, without masses or organomegaly palpable  Extremities:  Lower extremities - No pretibial edema. No lesions.  Neuro: MS is good with appropriate affect, pt is alert and Ox3    DM foot exam: 07/10/2021  The skin of the feet is intact without sores or ulcerations. The pedal pulses are 2+ on right and 2+ on left. The sensation is intact to a screening 5.07, 10 gram monofilament bilaterally   DATA REVIEWED:  Lab Results  Component Value Date   HGBA1C 9.5 (A) 05/08/2021   HGBA1C 8.4 (A) 01/27/2021   HGBA1C 9.3 (H) 10/22/2020   Lab Results  Component Value Date   MICROALBUR 1.4 03/27/2018   LDLCALC 116 (H) 07/09/2020   CREATININE 1.07 10/22/2020   Lab Results  Component Value Date   MICRALBCREAT 6.4 03/27/2018    Lab Results  Component Value Date   CHOL 255 (H) 10/22/2020   HDL 60.40 10/22/2020   LDLCALC 116 (H) 07/09/2020   LDLDIRECT 161.0 10/22/2020   TRIG 220.0 (H) 10/22/2020   CHOLHDL 4 10/22/2020        Latest Reference Range & Units 07/10/21 10:10  Sodium 135 - 145 mEq/L 141  Potassium 3.5 -  5.1 mEq/L 4.0  Chloride 96 - 112 mEq/L 101  CO2 19 - 32 mEq/L 32  Glucose 70 - 99 mg/dL 214 (H)  BUN 6 - 23 mg/dL 16  Creatinine 0.40 - 1.20 mg/dL 1.13  Calcium 8.4 - 10.5 mg/dL 9.6  GFR >60.00 mL/min 49.41 (L)    Latest Reference Range & Units 07/10/21 10:10  MICROALB/CREAT RATIO 0.0 - 30.0 mg/g 0.8    Latest Reference  Range & Units 07/10/21 10:10  C-Peptide 0.80 - 3.85 ng/mL 2.50    Latest Reference Range & Units 07/10/21 10:10  Creatinine,U mg/dL 87.7  Microalb, Ur 0.0 - 1.9 mg/dL <0.7  MICROALB/CREAT RATIO 0.0 - 30.0 mg/g 0.8   Thyroid ULtrasound 04/2020   Estimated total number of nodules >/= 1 cm: 1   Number of spongiform nodules >/=  2 cm not described below (TR1): 0   Number of mixed cystic and solid nodules >/= 1.5 cm not described below ([place]): 0   _________________________________________________________   Nodule # 4: The previously identified TI-RADS category 4 nodule in the right inferior gland is somewhat elongated and dumbbell-shaped. On today's study, slightly higher resolution demonstrates that this actually represents 2 smaller subcentimeter nodules which are adjacent and abutting. Measured independently, neither nodule measures over 1 cm and therefore does not meet criteria for further imaging surveillance. Of note, the nodular conglomerate demonstrates no interval change in size or appearance compared to the prior imaging from October 2020.   Incidental note again made of multiple cystic and minimally complex cystic/solid nodules scattered throughout the thyroid. None of these meet criteria for further evaluation.   IMPRESSION: The previously identified TI-RADS category 4 nodule in the right inferior gland is identified on the present examination as 2 smaller adjacent and abutting nodules. While the overall conglomerate demonstrates no change compared to the prior study, the fact that this is 2 separate smaller nodules results in a down grade of the TI-RADS classification system. The nodules do not meet size criteria to recommend further evaluation. No further follow-up required.    ASSESSMENT / PLAN / RECOMMENDATIONS:   1) Type 2 Diabetes Mellitus, Poorly controlled, With CKD III complications - Most recent A1c of 9.5 %. Goal A1c < 7.0 %.       - Pt with hx of  pancreatitis hence GLP-1 agonist and DPP 4 inhibitors as well as Tirzepatide are all contraindicated -We discussed causes for diabetes is multifactorial given chronic pancreatitis with the possibility of decreased endogenous production of insulin as well as chronic glucocorticoid therapy -We have discussed SGLT2 inhibitors as well as prandial insulin -We have opted to try SGLT2 inhibitors, we did discuss the benefits as well as the risk of genital infections, she does understand that with renal stones she does have a risk of infection, she will notify me with any side effects -She understands that if SGLT2 inhibitors do not improve her glycemic control then we have no option but to proceed with prandial insulin -I am going to reduce her basal insulin due to fasting hypoglycemia -Discussed pharmacokinetics of basal/bolus insulin and the importance of taking prandial insulin with meals.  -Unfortunately she is unable to stop snacking due to migraine headaches -I have put freestyle libre, to see if this will be covered through her insurance -C-peptide detectable at 2.5 ng/mL with a concomitant serum glucose of 214 mg/DL 07/2021  MEDICATIONS: Decrease Lantus to 50 units daily Start Jardiance 10 mg 1 tablet daily  EDUCATION / INSTRUCTIONS: BG monitoring instructions: Patient  is instructed to check her blood sugars 1 times a day, fasting. Call [facility] if: BG persistently < 70  I reviewed the Rule of 15 for the treatment of hypoglycemia in detail with the patient. Literature supplied.   2) Diabetic complications:  Eye: Does not have known diabetic retinopathy.  Neuro/ Feet: Does not have known diabetic peripheral neuropathy. Renal: Patient does  have known baseline CKD. She is  on an ACEI/ARB at present.   3) multinodular goiter:  -Her last thyroid ultrasound was in 2021, and there was no recommendations for serial monitoring -No local neck symptoms -Patient understands  that we will have low threshold for repeating ultrasound with any clinical concerns  Follow-up in 3 months      Signed electronically by: Mack Guise, MD  [facility]  [facility] [address] [place], [place] [address] Phone: [phone] FAX: [phone]   CC: [name], MD [place] [address] Phone: [phone]  Fax: [phone]    Return to Endocrinology clinic as below: Future Appointments  Date Time Provider [place]  07/23/2021  8:30 AM Metta Clines R, DO [facility] None  08/17/2021  7:45 AM [facility]  08/20/2021  8:40 AM [name], MD [facility]  10/07/2021  9:30 AM Jerral Mccauley, Melanie Crazier, MD [facility] None

## 2021-07-10 ENCOUNTER — Other Ambulatory Visit: Payer: Self-pay

## 2021-07-10 ENCOUNTER — Encounter: Payer: Self-pay | Admitting: Internal Medicine

## 2021-07-10 ENCOUNTER — Ambulatory Visit (INDEPENDENT_AMBULATORY_CARE_PROVIDER_SITE_OTHER): Payer: Medicare Other | Admitting: Internal Medicine

## 2021-07-10 VITALS — BP 142/76 | HR 80 | Ht 60.0 in | Wt 161.0 lb

## 2021-07-10 DIAGNOSIS — E1165 Type 2 diabetes mellitus with hyperglycemia: Secondary | ICD-10-CM

## 2021-07-10 DIAGNOSIS — E042 Nontoxic multinodular goiter: Secondary | ICD-10-CM | POA: Diagnosis not present

## 2021-07-10 DIAGNOSIS — Z794 Long term (current) use of insulin: Secondary | ICD-10-CM

## 2021-07-10 DIAGNOSIS — E1122 Type 2 diabetes mellitus with diabetic chronic kidney disease: Secondary | ICD-10-CM | POA: Insufficient documentation

## 2021-07-10 DIAGNOSIS — N1831 Chronic kidney disease, stage 3a: Secondary | ICD-10-CM

## 2021-07-10 DIAGNOSIS — R739 Hyperglycemia, unspecified: Secondary | ICD-10-CM

## 2021-07-10 LAB — BASIC METABOLIC PANEL
BUN: 16 mg/dL (ref 6–23)
CO2: 32 mEq/L (ref 19–32)
Calcium: 9.6 mg/dL (ref 8.4–10.5)
Chloride: 101 mEq/L (ref 96–112)
Creatinine, Ser: 1.13 mg/dL (ref 0.40–1.20)
GFR: 49.41 mL/min — ABNORMAL LOW (ref >60.00)
Glucose, Bld: 214 mg/dL — ABNORMAL HIGH (ref 70–99)
Potassium: 4 mEq/L (ref 3.5–5.1)
Sodium: 141 mEq/L (ref 135–145)

## 2021-07-10 LAB — MICROALBUMIN / CREATININE URINE RATIO
Creatinine,U: 87.7 mg/dL
Microalb Creat Ratio: 0.8 mg/g (ref 0.0–30.0)
Microalb, Ur: <0.7 mg/dL (ref 0.0–1.9)

## 2021-07-10 MED ORDER — EMPAGLIFLOZIN 10 MG PO TABS: 10.0000 mg | ORAL_TABLET | Freq: Every day | ORAL | 1 refills | Status: DC

## 2021-07-10 MED ORDER — FREESTYLE LIBRE 3 SENSOR MISC: 1.0000 | 3 refills | Status: DC

## 2021-07-10 NOTE — Patient Instructions (Addendum)
-   Decrease Lantus 50 units daily  - Start Jardiance 10 mg, 1 tablet every morning     HOW TO TREAT LOW BLOOD SUGARS (Blood sugar LESS THAN 70 MG/DL) Please follow the RULE OF 15 for the treatment of hypoglycemia treatment (when your (blood sugars are less than 70 mg/dL)   STEP 1: Take 15 grams of carbohydrates when your blood sugar is low, which includes:  3-4 GLUCOSE TABS  OR 3-4 OZ OF JUICE OR REGULAR SODA OR ONE TUBE OF GLUCOSE GEL    STEP 2: RECHECK blood sugar in 15 MINUTES STEP 3: If your blood sugar is still low at the 15 minute recheck --> then, go back to STEP 1 and treat AGAIN with another 15 grams of carbohydrates.

## 2021-07-11 LAB — C-PEPTIDE: C-Peptide: 2.5 ng/mL (ref 0.80–3.85)

## 2021-07-12 ENCOUNTER — Encounter: Payer: Self-pay | Admitting: Internal Medicine

## 2021-07-13 LAB — POCT GLYCOSYLATED HEMOGLOBIN (HGB A1C): Hemoglobin A1C: 9.5 % — AB (ref 4.0–5.6)

## 2021-07-14 ENCOUNTER — Other Ambulatory Visit: Payer: Self-pay | Admitting: Neurology

## 2021-07-14 ENCOUNTER — Other Ambulatory Visit: Payer: Self-pay | Admitting: Family Medicine

## 2021-07-14 DIAGNOSIS — G8929 Other chronic pain: Secondary | ICD-10-CM

## 2021-07-21 NOTE — Progress Notes (Signed)
NEUROLOGY FOLLOW UP OFFICE NOTE  Debra Leach [id number]  Assessment/Plan:   Chronic migraine without aura, without status migrainosus, not intractable - increased frequency and severity secondary to COVID Benign paroxysmal positional vertigo - increased frequency secondary to long-haul COVID Elevated blood pressure - follow up with PCP   Migraine prevention:  Continue Vyepti 330m Q12wks.  If no improvement in 2 to 4 weeks, plan to increase gabapentin to 3020min AM and 60041mt bedtime Migraine rescue:  Ubrelvy 100m87mabapentin 100mg24m less severe For dizziness, she will contact her PT to restart vestibular rehab.  May use meclizine for severe attacks but use sparingly.   Limit use of pain relievers to no more than 2 days out of week to prevent risk of rebound or medication-overuse headache. Keep headache diary Follow up 6 months.     Subjective:  Debra Leach 70 ye77 old right-handed female with autoimmune pancreatitis, migraines, hyperlipidemia, diabetes, GERD, osteoarthritis, and history of MVP and renal stones who follows up for migraines as well as new concern, dizziness.  ENT note reviewed.   UPDATE: Dizziness: Vertigo resolved with vestibular rehab.  She was released Oct 11.  On Oct 12, she got COVID and has had recurrence of vertigo.  She is performing home Epley maneuver which helps but has not yet completely resolved.  Migraine: From August to October - 27 headache-free days 16 days 2/10 lasting 3 hours 28 days 3/10 lasting 5 hours 19 days 4/10 lasting 4 hours 2 days 5/10 lasting 5 hours  She got COVID in [place] November to February- 13 headache-free days 22 days 2/10 lasting 3 hours 31 days 3/10 lasting 3 hours 25 days 4/10 lasting 3 hours 12 days 5/10 lasting 5 hours 1 day 6/10 lasting 4 hours 2 days 7/10 lasting 11 to 18 hours  Received Vyepti a week ago.  Usually takes a couple of weeks to kick in.  Rescue protocol:  For  moderate - gabapentin 100mg,48m severe- UbreRoselyn Meiernt NSAIDS/steroid: budesomide Current analgesics: None Current triptans: None Current ergotamine: None Current anti-emetic: Promethazine 12.5 mg Current muscle relaxants: None Current anti-anxiolytic: None Current sleep aide: None Current Antihypertensive medications: losartan-HCTZ Current Antidepressant medications: None Current Anticonvulsant medications: Gabapentin 600mg a57mdtime Current anti-CGRP: Vyepti 300mg; U62mvy Current Vitamins/Herbal/Supplements: Riboflavin 400 mg daily, vitamin D Current Antihistamines/Decongestants: meclizine Other therapy: Cefaly, ice pack Other medication: Meclizine   Caffeine: 1 cup of coffee daily Alcohol: None Smoker: No Diet: Follows [employer] ImmunoCalm Dietary Program.  Hydrates. Exercise: When tolerated (knee problems).  She walks and bikes. Depression: No; Anxiety: No Other pain:  no Sleep hygiene: Improved.  Sleeps better now than every before. 100mg dur51mday as needed   HISTORY: Migraines: Onset: 19 years 22d Location:  Varies (unilateral either side, frontal-temporal, back of head) Quality:  Varies (stabbing, pounding, throbbing) Initial Intensity:  10/10 severe, otherwise 5-7/10 Aura:  Occurs off and on over the years and varies in semiology.  In early 1970s, black out.  In late 1980s, white out.  In late 1990s, flashing lights and zigzag lines.  Since 2016, scintillating scotoma (occurs 1 to 2 times a month) Prodrome:  no Associated symptoms: Initially vomiting.  Sometimes nausea.  Photophobia, phonophobia.  Dizziness. Initial Duration:  All day Initial Frequency:  daily Triggers: Change in weather, emotional stress, valsalva maneuver, odors, certain foods Relieving factors: ice, rest Activity:  Difficult to function if severe   Past NSAIDS:  diclofenac 50mg, Cam59m Mobic 15mg, ibup26mn, naproxen, toradol Past  analgesics:  tramadol (reaction), Midrin, Excedrin Past  abortive triptans/ergots:  Treximet, Axert, Amerge, Frova, Maxalt, Relpax, Zomig tablet, DHE NS, sumatriptan 173m; sumatriptan 683mSC Past anti-emetic:  Zofran 59m75mast anxiolytic:  Buspirone, clonazepam Past antihypertensive medications:  Metoprolol, Norvasc, propranolol ER 120m459mtenolol 100mg359mde effects dizziness, myalgias, acid reflux) Past antidepressant medications:  Venlafaxine, sertraline, amitriptyline, amoxapine, duloxetine, nortriptyline, imipramine, Luvox, Remeron, Serzone, bupropion, desipramine, doxepin, Vivactil Past anticonvulsant medications:  Depakote, Topamax, zonisamide, Lamictal, Keppra, Lyrica, gabapentin 300/300/600  Past anti--CGRP: Aimovig, Emgality Past vitamins/Herbal/Supplements:  butterbur, Mg, CoQ10 Other past treatments:  Botox, Cefaly Device, methylergonovine, acupuncture, biofeedback, Seroquel, Thorazine, methylsergide maleate   Family history of headache:  Maybe her mother's side.   Prior brain MRI normal (date unknown but report is mentioned in prior notes).   DIzziness: Reports dizziness since the beginning of the year.  No preceding head injury or new medication.  Reports episodic dizziness described as a wave of lightheadedness or sensation of stepping off of a boat, but not spinning.  It occurs with any movement but also can occur spontaneously.  Typically lasts 15 seconds but occurs all day long.  It doesn't occur while laying in bed, such as turning over.  No headache but feels like her head is floating.  No visual disturbance.  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  It does not appear to correlate with a migraine flare.  She reports history of mild orthostatic hypotension when getting up too fast, but not experiencing that with this and reportedly orthostatic vitals were negative when checked by her PCP.  Flonase didn't help, however meclizine is effective.   She followed up with ENT.  Dix-Hallpike was negative.  VNG showed abnormal smooth pursuit, saccades and optokinetic testing - central pathology cannot be ruled out.  She has bilateral sensorineural hearing loss.  Hearing aids were recommended.  Underwent workup for possible central dizziness.  MRI and MRA of brain on 12/23/2020 were unremarkable.   Abnormal Movements: In December 2017, we started atenolol for migraine prevention.  It worked well, but due to reported side effects of dizziness, acid reflux and joint and muscle pain, she was tapered off of it in early April.  Around that time, she developed shaking episodes or tremors associated with her migraines.  She followed up with her PCP, Dr. BedsoDiona Browner5/4/18 and we had her discontinue the gabapentin.  She hasn't had any recurrent tremors, however she says she has gone up to 4 days at a time without an attack.  Since discontinuing the gabapentin, the intensity of her daily headaches have gotten worse.     I reviewed the video of her habitual attacks.  In the video, she exhibits flapping of her right hand, arrhythmic and without tremor or choreiform movement.  It is not an arrhythmic jerking, consistent with myoclonus.  It sometimes involves the left hand as well.  She denies stress and anxiety.  PAST MEDICAL HISTORY: 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HCC)    Clotting disorder (HCC)    Complication of anesthesia    difficult waking   Diabetes mellitus without complication (HCC)    Diet and exercise controlled   GERD (gastroesophageal reflux disease)    Headache    History of kidney stones    Hyperlipidemia  Hypertension    MVP (mitral valve prolapse)    history of   PONV (postoperative nausea and vomiting)     MEDICATIONS: Current Outpatient Medications on File Prior to Visit  Medication Sig Dispense Refill   atorvastatin  (LIPITOR) 80 MG tablet TAKE 1 TABLET BY MOUTH DAILY 90 tablet 1   Blood Glucose Monitoring Suppl (ONETOUCH VERIO REFLECT) w/Device KIT 2 (two) times daily. for testing     budesonide (ENTOCORT EC) 3 MG 24 hr capsule Take 2 capsules by mouth daily.     cholecalciferol (VITAMIN D) 1000 units tablet Take 1,000 Units by mouth daily.     colesevelam (WELCHOL) 625 MG tablet TAKE 3 TABLETS BY MOUTH 2 TIMES DAILY WITH A MEAL. 540 tablet 3   Continuous Blood Gluc Sensor (FREESTYLE LIBRE 3 SENSOR) MISC 1 Device by Does not apply route every 14 (fourteen) days. Place 1 sensor on the skin every 14 days. Use to check glucose continuously 6 each 3   ELIQUIS 2.5 MG TABS tablet TAKE 1 TABLET BY MOUTH 2 TIMES DAILY. 180 tablet 0   empagliflozin (JARDIANCE) 10 MG TABS tablet Take 1 tablet (10 mg total) by mouth daily before breakfast. 90 tablet 1   Eptinezumab-jjmr (VYEPTI) 100 MG/ML injection Inject 300 mg into the vein.     gabapentin (NEURONTIN) 100 MG capsule Take 100 mg by mouth 3 (three) times daily as needed.     gabapentin (NEURONTIN) 300 MG capsule TAKE 2 CAPSULES (600 MG TOTAL) BY MOUTH AT BEDTIME. 180 capsule 0   glucose blood (ONETOUCH VERIO) test strip USE TO CHECK BLOOD SUGAR TWO TIMES A DAY 200 strip 3   insulin glargine (LANTUS) 100 UNIT/ML Solostar Pen Inject 60 Units into the skin daily.     Insulin Pen Needle (ULTICARE SHORT PEN NEEDLES) 31G X 8 MM MISC USE TO INJECT INSULIN DAILY 100 each 3   Lancets (ONETOUCH DELICA PLUS ONGEXB28U) MISC USE TO CHECK BLOOD SUGAR TWO TIMES DAILY 200 each 3   losartan-hydrochlorothiazide (HYZAAR) 50-12.5 MG tablet Take 1 tablet by mouth daily. 90 tablet 3   omeprazole (PRILOSEC) 20 MG capsule Take 20 mg by mouth 2 (two) times daily before a meal.     potassium chloride SA (KLOR-CON M) 20 MEQ tablet TAKE 1 TABLET BY MOUTH DAILY. 90 tablet 0   UBRELVY 100 MG TABS TAKE 1 TABLET BY MOUTH AS NEEDED (MAY REPEAT DOSE AFTER 2 HOURS IF NEEDED. MAXIMUM 2 TABLETS IN 24  HOURS). 10 tablet 3   vitamin B-12 (CYANOCOBALAMIN) 1000 MCG tablet Take 1 tablet by mouth daily.     No current facility-administered medications on file prior to visit.    ALLERGIES: Allergies  Allergen Reactions   Atenolol    Codeine     REACTION: Migraine   Ibuprofen    Penicillins Itching    Has patient had a PCN reaction causing immediate rash, facial/tongue/throat swelling, SOB or lightheadedness with hypotension: No Has patient had a PCN reaction causing severe rash involving mucus membranes or skin necrosis: No Has patient had a PCN reaction that required hospitalization: No Has patient had a PCN reaction occurring within the last 10 years: No If all of the above answers are "NO", then may proceed with Cephalosporin use.    Pentazocine Lactate     REACTION: Swelling, itching, rash   Propranolol Other (See Comments)   Talwin [Pentazocine] Other (See Comments)    Swelling and itching    Crestor [Rosuvastatin] Other (See Comments)  Sudafed [Pseudoephedrine Hcl] Itching and Anxiety    FAMILY HISTORY: Family History  Problem Relation Age of Onset   Cancer Mother        bone   Hypertension Father    Mitral valve prolapse Father    Asthma Brother    Arthritis Brother    Nephrolithiasis Brother    Nephrolithiasis Brother    Asthma Brother    Arthritis Brother    Aortic aneurysm Brother        ascending aortic aneuysm   Breast cancer Maternal Aunt    Breast cancer Maternal Aunt       Objective:  Blood pressure (!) 150/69, pulse 96, height 5' (1.524 m), weight 160 lb 12.8 oz (72.9 kg), SpO2 96 %. General: No acute distress.  Patient appears well-groomed.    Metta Clines, DO  CC: Eliezer Lofts, MD

## 2021-07-23 ENCOUNTER — Other Ambulatory Visit: Payer: Self-pay

## 2021-07-23 ENCOUNTER — Ambulatory Visit (INDEPENDENT_AMBULATORY_CARE_PROVIDER_SITE_OTHER): Payer: Medicare Other | Admitting: Neurology

## 2021-07-23 ENCOUNTER — Encounter: Payer: Self-pay | Admitting: Neurology

## 2021-07-23 VITALS — BP 150/69 | HR 96 | Ht 60.0 in | Wt 160.8 lb

## 2021-07-23 DIAGNOSIS — R030 Elevated blood-pressure reading, without diagnosis of hypertension: Secondary | ICD-10-CM | POA: Diagnosis not present

## 2021-07-23 DIAGNOSIS — G43709 Chronic migraine without aura, not intractable, without status migrainosus: Secondary | ICD-10-CM

## 2021-07-23 DIAGNOSIS — H8110 Benign paroxysmal vertigo, unspecified ear: Secondary | ICD-10-CM | POA: Diagnosis not present

## 2021-07-23 NOTE — Patient Instructions (Signed)
Continue Vyepti 300mg .  If no improvement in the next 2 to 4 weeks, contact me and we can increase gabapentin May use meclizine for severe vertigo attacks but overall limit to no more than 2 days out of the week Consider another round of vestibular rehab Ubrelvy or gabapentin 100mg  as needed Follow up 6 months.

## 2021-07-24 ENCOUNTER — Encounter: Payer: Self-pay | Admitting: Neurology

## 2021-07-26 ENCOUNTER — Encounter: Payer: Self-pay | Admitting: Internal Medicine

## 2021-07-26 DIAGNOSIS — Z794 Long term (current) use of insulin: Secondary | ICD-10-CM

## 2021-07-26 DIAGNOSIS — E1165 Type 2 diabetes mellitus with hyperglycemia: Secondary | ICD-10-CM

## 2021-07-26 DIAGNOSIS — N1831 Chronic kidney disease, stage 3a: Secondary | ICD-10-CM

## 2021-07-26 DIAGNOSIS — N760 Acute vaginitis: Secondary | ICD-10-CM

## 2021-07-27 ENCOUNTER — Other Ambulatory Visit: Payer: Self-pay

## 2021-07-27 DIAGNOSIS — R42 Dizziness and giddiness: Secondary | ICD-10-CM

## 2021-07-27 MED ORDER — TERCONAZOLE 0.4 % VA CREA: 1.0000 | TOPICAL_CREAM | Freq: Every day | VAGINAL | 0 refills | Status: DC

## 2021-07-27 MED ORDER — REPAGLINIDE 0.5 MG PO TABS: 0.5000 mg | ORAL_TABLET | Freq: Three times a day (TID) | ORAL | 1 refills | Status: DC

## 2021-07-27 NOTE — Progress Notes (Signed)
Per pt, Advised by [facility] to have a new referral sent over for Vestibular rehab.

## 2021-07-28 ENCOUNTER — Encounter: Payer: Self-pay | Admitting: Emergency Medicine

## 2021-07-28 ENCOUNTER — Telehealth: Payer: Self-pay | Admitting: Family Medicine

## 2021-07-28 ENCOUNTER — Ambulatory Visit
Admission: EM | Admit: 2021-07-28 | Discharge: 2021-07-28 | Disposition: A | Discharge status: Home / Self Care | Payer: Medicare Other | Attending: Emergency Medicine | Admitting: Emergency Medicine

## 2021-07-28 DIAGNOSIS — R319 Hematuria, unspecified: Secondary | ICD-10-CM | POA: Diagnosis present

## 2021-07-28 DIAGNOSIS — N898 Other specified noninflammatory disorders of vagina: Secondary | ICD-10-CM | POA: Insufficient documentation

## 2021-07-28 LAB — POCT URINALYSIS DIP (MANUAL ENTRY)
Bilirubin, UA: NEGATIVE
Glucose, UA: NEGATIVE mg/dL
Ketones, POC UA: NEGATIVE mg/dL
Nitrite, UA: NEGATIVE
Protein Ur, POC: NEGATIVE mg/dL
Spec Grav, UA: <=1.005 — AB (ref 1.010–1.025)
Urobilinogen, UA: 0.2 E.U./dL
pH, UA: 5 (ref 5.0–8.0)

## 2021-07-28 MED ORDER — CEPHALEXIN 500 MG PO CAPS: 500.0000 mg | ORAL_CAPSULE | Freq: Two times a day (BID) | ORAL | 0 refills | Status: AC

## 2021-07-28 NOTE — Telephone Encounter (Signed)
Mrs. Debra Leach called in and stated that she wanted Dr. Diona Browner to ask for a urinalysis due to she is having blood in urine and it started this morning, and she was on jardiance and she was having side effects  gental swelling and yeast infection, and  dr. Daisy Floro treated her for the yeast infection but told her to call her PCP. She stopped using the jardiance on Saturday

## 2021-07-28 NOTE — Telephone Encounter (Signed)
Did connect her to access

## 2021-07-28 NOTE — Discharge Instructions (Addendum)
Take the antibiotic as directed.  The urine culture is pending.  We will call you if it shows the need to change or discontinue your antibiotic.    Schedule a follow-up appointment with your primary care provider to recheck your urine and discuss any concerns

## 2021-07-28 NOTE — ED Triage Notes (Signed)
Pt here with vaginal itching and swelling x 4 days and hematuria or vaginal bleeding today. Pt is on Eliquis and started Jardiance x about 2 weeks ago. Has since stopped the Jardiance thinking it was side effects from that, but continues to have sx.

## 2021-07-28 NOTE — ED Provider Notes (Signed)
Roderic Palau    CSN: [id number] Arrival date & time: 07/28/21  1259      History   Chief Complaint Chief Complaint  Patient presents with   Vaginal Bleeding   Vaginal Itching    HPI Debra Leach is a 71 y.o. female.  Patient presents with blood in her urine or possibly from her vagina x4 days.  She is unsure whether the blood is coming from her bladder or vagina.  She reports vaginal itching and swelling also after starting Jardiance.  No vaginal discharge, abdominal pain, dysuria, flank pain, pelvic pain, or other symptoms.  No sexual activity for the last 7 or 8 years.  She is on Eliquis.  She was started on Jardiance a couple of weeks ago but then stopped it on 07/26/2021 thinking it may be causing the vaginal symptoms and blood.  She contacted her PCP through her chart and was prescribed terconazole vaginal cream; she has not started using this cream yet.  She has had a hysterectomy; she still has her ovaries.  Her medical history also includes kidney stones, diabetes, hypertension, pulmonary embolism and infarction, DVT.  The history is provided by the patient and medical records.   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[place])    Clotting disorder (HCC)    Complication of anesthesia    difficult waking   Diabetes mellitus without complication (HCC)    Diet and exercise controlled   GERD (gastroesophageal reflux disease)    Headache    History of kidney stones    Hyperlipidemia    Hypertension    MVP (mitral valve prolapse)    history of   PONV (postoperative nausea and vomiting)     Patient Active Problem List   Diagnosis Date Noted   Type 2 diabetes mellitus with stage 3a chronic kidney disease, with long-term current use of insulin ([place]) 07/10/2021   Multinodular goiter 07/10/2021   Leg cramps 05/08/2021   Spondylolisthesis at L4-L5 level 04/24/2021    COVID-19 03/23/2021   Nausea 08/15/2020   Thyroid nodule 04/11/2020   Right leg pain 04/11/2020   Leg weakness, bilateral 10/30/2019   Situational anxiety 10/12/2019   AK (actinic keratosis) 09/26/2019   Dyspnea on minimal exertion 07/13/2019   Stuttering 07/13/2019   Hypertension associated with diabetes ([place]) 07/06/2019   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[place]) 07/06/2019   Multiple thyroid nodules 03/27/2019   DVT, lower extremity, proximal, acute, right ([place]) 03/13/2019   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[place]) 03/01/2019   Leukocytosis 03/01/2019   Breast nodule 03/01/2019   Abnormal renal finding 06/29/2018   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[place]) 08/16/2017   Facial rash 03/01/2017   Eustachian tube disorder, bilateral 12/16/2016   Lightheadedness 12/16/2016   Esophageal dysphagia 11/22/2016   Chronic low back pain 10/07/2016   Vision changes 08/12/2016   Aortic atherosclerosis ([place]) 06/11/2016   Supraclavicular fossa fullness 06/08/2016   Family history of aortic aneurysm 08/20/2014   Vitamin D deficiency 08/20/2014   Type 2 diabetes mellitus with hyperglycemia ([place]) 01/08/2011   Osteopenia 11/06/2009   Hyperlipidemia associated with type 2 diabetes mellitus ([place]) 07/05/2008   INSOMNIA, CHRONIC 07/05/2008   Migraine with aura 07/05/2008   GERD 07/05/2008   OSTEOARTHRITIS 07/05/2008   MITRAL VALVE PROLAPSE, HX OF 07/05/2008   NEPHROLITHIASIS, HX OF 07/05/2008    Past Surgical History:  Procedure Laterality Date  arm surgery     BREAST BIOPSY Left    BREAST SURGERY  2000   breast biopsy (benign)   LITHOTRIPSY  08-12-2008   stent placed bilaterally   PARTIAL HYSTERECTOMY     Both ovaries remain, vaginal, for [facility] [place]    OB History   No obstetric history on file.      Home Medications    Prior to Admission medications   Medication Sig Start Date End Date Taking? Authorizing Provider  cephALEXin  (KEFLEX) 500 MG capsule Take 1 capsule (500 mg total) by mouth 2 (two) times daily for 5 days. 07/28/21 08/02/21 Yes Sharion Balloon, NP  atorvastatin (LIPITOR) 80 MG tablet TAKE 1 TABLET BY MOUTH DAILY 04/13/21   Bedsole, Amy E, MD  Blood Glucose Monitoring Suppl (ONETOUCH VERIO REFLECT) w/Device KIT 2 (two) times daily. for testing 10/25/19   [provider]  budesonide (ENTOCORT EC) 3 MG 24 hr capsule Take 2 capsules by mouth daily. 05/03/20   [provider]  cholecalciferol (VITAMIN D) 1000 units tablet Take 1,000 Units by mouth daily.    [provider]  colesevelam (WELCHOL) 625 MG tablet TAKE 3 TABLETS BY MOUTH 2 TIMES DAILY WITH A MEAL. 03/04/20   Bedsole, Amy E, MD  Continuous Blood Gluc Sensor (FREESTYLE LIBRE 3 SENSOR) MISC 1 Device by Does not apply route every 14 (fourteen) days. Place 1 sensor on the skin every 14 days. Use to check glucose continuously 07/10/21   Shamleffer, Melanie Crazier, MD  ELIQUIS 2.5 MG TABS tablet TAKE 1 TABLET BY MOUTH 2 TIMES DAILY. 07/14/21   Bedsole, Amy E, MD  empagliflozin (JARDIANCE) 10 MG TABS tablet Take 1 tablet (10 mg total) by mouth daily before breakfast. 07/10/21   Shamleffer, Melanie Crazier, MD  Eptinezumab-jjmr (VYEPTI) 100 MG/ML injection Inject 300 mg into the vein.    [provider]  gabapentin (NEURONTIN) 100 MG capsule Take 100 mg by mouth 3 (three) times daily as needed.    [provider]  gabapentin (NEURONTIN) 300 MG capsule TAKE 2 CAPSULES (600 MG TOTAL) BY MOUTH AT BEDTIME. 07/14/21   Jaffe, Adam R, DO  glucose blood (ONETOUCH VERIO) test strip USE TO CHECK BLOOD SUGAR TWO TIMES A DAY 05/08/21   Bedsole, Amy E, MD  Insulin Glargine Solostar (LANTUS) 100 UNIT/ML Solostar Pen 50 units daily 07/11/21   [provider]  Insulin Pen Needle (ULTICARE SHORT PEN NEEDLES) 31G X 8 MM MISC USE TO INJECT INSULIN DAILY 02/19/21   Bedsole, Amy E, MD  Lancets (ONETOUCH DELICA PLUS [id number]) MISC USE TO CHECK  BLOOD SUGAR TWO TIMES DAILY 05/08/21   Bedsole, Amy E, MD  losartan-hydrochlorothiazide (HYZAAR) 50-12.5 MG tablet Take 1 tablet by mouth daily. 10/24/20   Bedsole, Amy E, MD  omeprazole (PRILOSEC) 20 MG capsule Take 20 mg by mouth 2 (two) times daily before a meal.    [provider]  potassium chloride SA (KLOR-CON M) 20 MEQ tablet TAKE 1 TABLET BY MOUTH DAILY. 07/14/21   Bedsole, Amy E, MD  repaglinide (PRANDIN) 0.5 MG tablet Take 1 tablet (0.5 mg total) by mouth 3 (three) times daily before meals. 07/27/21   Shamleffer, Melanie Crazier, MD  terconazole (TERAZOL 7) 0.4 % vaginal cream Place 1 applicator vaginally at bedtime. 07/27/21   Shamleffer, Melanie Crazier, MD  UBRELVY 100 MG TABS TAKE 1 TABLET BY MOUTH AS NEEDED (MAY REPEAT DOSE AFTER 2 HOURS IF  NEEDED. MAXIMUM 2 TABLETS IN 24 HOURS). 03/27/20   Pieter Partridge, DO  vitamin B-12 (CYANOCOBALAMIN) 1000 MCG tablet Take 1 tablet by mouth daily. 08/15/20   [provider]    Family History Family History  Problem Relation Age of Onset   Cancer Mother        bone   Hypertension Father    Mitral valve prolapse Father    Asthma Brother    Arthritis Brother    Nephrolithiasis Brother    Nephrolithiasis Brother    Asthma Brother    Arthritis Brother    Aortic aneurysm Brother        ascending aortic aneuysm   Breast cancer Maternal Aunt    Breast cancer Maternal Aunt     Social History Social History   Tobacco Use   Smoking status: Former    Types: Cigarettes   Smokeless tobacco: Never  Vaping Use   Vaping Use: Never used  Substance Use Topics   Alcohol use: No   Drug use: No     Allergies   Atenolol, Codeine, Ibuprofen, Penicillins, Pentazocine lactate, Propranolol, Talwin [pentazocine], Crestor [rosuvastatin], and Sudafed [pseudoephedrine hcl]   Review of Systems Review of Systems  Constitutional:  Negative for chills and fever.  Gastrointestinal:  Negative for abdominal pain, diarrhea, nausea and  vomiting.  Genitourinary:  Positive for hematuria, vaginal bleeding and vaginal pain. Negative for dysuria, flank pain, pelvic pain and vaginal discharge.  Skin:  Negative for rash.  All other systems reviewed and are negative.   Physical Exam Triage Vital Signs ED Triage Vitals [07/28/21 1341]  Enc Vitals Group     BP (!) 148/76     Pulse Rate (!) 105     Resp 20     Temp 98.9 F (37.2 C)     Temp Source Oral     SpO2 97 %     Weight      Height      Head Circumference      Peak Flow      Pain Score      Pain Loc      Pain Edu?      Excl. in [place]?    No data found.  Updated Vital Signs BP (!) 148/76    Pulse (!) 105    Temp 98.9 F (37.2 C) (Oral)    Resp 20    SpO2 97%   Visual Acuity Right Eye Distance:   Left Eye Distance:   Bilateral Distance:    Right Eye Near:   Left Eye Near:    Bilateral Near:     Physical Exam Vitals and nursing note reviewed. Exam conducted with a chaperone present Ubaldo Glassing, Therapist, sports).  Constitutional:      General: She is not in acute distress.    Appearance: Normal appearance. She is well-developed. She is not ill-appearing.  HENT:     Mouth/Throat:     Mouth: Mucous membranes are moist.  Cardiovascular:     Rate and Rhythm: Normal rate and regular rhythm.     Heart sounds: Normal heart sounds.  Pulmonary:     Effort: Pulmonary effort is normal. No respiratory distress.     Breath sounds: Normal breath sounds.  Abdominal:     General: Bowel sounds are normal.     Palpations: Abdomen is soft.     Tenderness: There is no abdominal tenderness. There is no right CVA tenderness, left CVA tenderness, guarding or rebound.  Genitourinary:  General: Normal vulva.     Vagina: No vaginal discharge.     Comments: No vaginal bleeding or discharge noted.  Musculoskeletal:     Cervical back: Neck supple.  Skin:    General: Skin is warm and dry.  Neurological:     Mental Status: She is alert.  Psychiatric:        Mood and Affect: Mood  normal.        Behavior: Behavior normal.     UC Treatments / Results  Labs (all labs ordered are listed, but only abnormal results are displayed) Labs Reviewed  POCT URINALYSIS DIP (MANUAL ENTRY) - Abnormal; Notable for the following components:      Result Value   Spec Grav, UA <=1.005 (*)    Blood, UA moderate (*)    Leukocytes, UA Trace (*)    All other components within normal limits  URINE CULTURE  CERVICOVAGINAL ANCILLARY ONLY    EKG   Radiology No results found.  Procedures Procedures (including critical care time)  Medications Ordered in UC Medications - No data to display  Initial Impression / Assessment and Plan / UC Course  I have reviewed the triage vital signs and the nursing notes.  Pertinent labs & imaging results that were available during my care of the patient were reviewed by me and considered in my medical decision making (see chart for details).   Hematuria, Vaginal irritation.  No evidence of vaginal bleeding today.  Treating with Keflex. Urine culture pending. Discussed with patient that we will call her if the urine culture shows the need to change or discontinue the antibiotic. Vaginal swab obtained for testing.  Instructed her to follow-up with her PCP for a recheck in 1 week. Patient agrees to plan of care.      Final Clinical Impressions(s) / UC Diagnoses   Final diagnoses:  Hematuria, unspecified type  Vaginal irritation     Discharge Instructions      Take the antibiotic as directed.  The urine culture is pending.  We will call you if it shows the need to change or discontinue your antibiotic.    Schedule a follow-up appointment with your primary care provider to recheck your urine and discuss any concerns     ED Prescriptions     Medication Sig Dispense Auth. Provider   cephALEXin (KEFLEX) 500 MG capsule Take 1 capsule (500 mg total) by mouth 2 (two) times daily for 5 days. 10 capsule Sharion Balloon, NP      PDMP not  reviewed this encounter.   Sharion Balloon, NP 07/28/21 [phone]

## 2021-07-28 NOTE — Telephone Encounter (Signed)
Patient is currently at the [facility].

## 2021-07-28 NOTE — Telephone Encounter (Signed)
PLEASE NOTE: All timestamps contained within this report are represented as [place] Standard Time. CONFIDENTIALTY NOTICE: This fax transmission is intended only for the addressee. It contains information that is legally privileged, confidential or otherwise protected from use or disclosure. If you are not the intended recipient, you are strictly prohibited from reviewing, disclosing, copying using or disseminating any of this information or taking any action in reliance on or regarding this information. If you have received this fax in error, please notify us immediately by telephone so that we can arrange for its return to [place]. Phone: [phone], Toll-Free: [phone], Fax: [phone] Page: 1 of 2 Call Id: [id number] [facility] - Client TELEPHONE ADVICE RECORD [facility] Patient Name: Debra Leach Gender: Female DOB: February 27, 1951 Age: 71 Y 2 M 24 D Return Phone Number: [phone] (Primary), [phone] (Secondary) Address: City/ [place]/ [place] [address] Client [facility] - Client Client Site [place] - Day Provider Eliezer Lofts - MD Contact Type Call Who Is Calling Patient / Member / Family / Caregiver Call Type Triage / Clinical Relationship To Patient Self Return Phone Number [phone] (Primary) Chief Complaint Urine, Blood In Reason for Call Symptomatic / Request for Health Information Initial Comment Office is transferring pt who has blood in urine today (no appts available today.) Translation No Nurse Assessment Nurse: Toribio Harbour, RN, Joelene Millin Date/Time (Eastern Time): 07/28/2021 12:05:39 PM Confirm and document reason for call. If symptomatic, describe symptoms. ---Caller states she has blood in her urine. She started Jardiance on 2/4. She had perianal and genital swelling and itching on Friday. No fever. She stopped Jardiance on 2/18 and notified her endocrinologist. She  has not started any other oral diabetic medications yet. Does the patient have any new or worsening symptoms? ---Yes Will a triage be completed? ---Yes Related visit to physician within the last 2 weeks? ---No Does the PT have any chronic conditions? (i.e. diabetes, asthma, this includes High risk factors for pregnancy, etc.) ---Yes List chronic conditions. ---Diabetes, autoimmune pancreatitis, migraines, blood clots in lungs and legs Is this a behavioral health or substance abuse call? ---No Guidelines Guideline Title Affirmed Question Affirmed Notes Nurse Date/Time (Eastern Time) Urine - Blood In Taking Coumadin (warfarin) or other strong blood thinner, or known bleeding disorder (e.g., thrombocytopenia) Toribio Harbour, RN, Joelene Millin 07/28/2021 12:10:37 PM PLEASE NOTE: All timestamps contained within this report are represented as [place] Standard Time. CONFIDENTIALTY NOTICE: This fax transmission is intended only for the addressee. It contains information that is legally privileged, confidential or otherwise protected from use or disclosure. If you are not the intended recipient, you are strictly prohibited from reviewing, disclosing, copying using or disseminating any of this information or taking any action in reliance on or regarding this information. If you have received this fax in error, please notify us immediately by telephone so that we can arrange for its return to [place]. Phone: [phone], Toll-Free: [phone], Fax: [phone] Page: 2 of 2 Call Id: [id number] [place]. Time Eilene Ghazi Time) Disposition Final User 07/28/2021 12:18:24 PM See HCP within 4 Hours (or PCP triage) Yes Toribio Harbour, RN, Renea Ee Disagree/Comply Comply Caller Understands Yes PreDisposition Call Doctor Care Advice Given Per Guideline CALL BACK IF: * Fever occurs * You become worse CARE ADVICE given per Urine, Blood In (Adult) guideline. Referrals GO TO FACILITY UNDECIDED

## 2021-07-29 LAB — CERVICOVAGINAL ANCILLARY ONLY
Bacterial Vaginitis (gardnerella): NEGATIVE
Candida Glabrata: NEGATIVE
Candida Vaginitis: NEGATIVE
Chlamydia: NEGATIVE
Comment: NEGATIVE
Comment: NEGATIVE
Comment: NEGATIVE
Comment: NEGATIVE
Comment: NEGATIVE
Comment: NORMAL
Neisseria Gonorrhea: NEGATIVE
Trichomonas: NEGATIVE

## 2021-07-29 LAB — URINE CULTURE

## 2021-08-05 ENCOUNTER — Encounter: Payer: Self-pay | Admitting: Physical Therapy

## 2021-08-06 ENCOUNTER — Ambulatory Visit (INDEPENDENT_AMBULATORY_CARE_PROVIDER_SITE_OTHER): Payer: Medicare Other | Admitting: Family Medicine

## 2021-08-06 ENCOUNTER — Other Ambulatory Visit: Payer: Self-pay

## 2021-08-06 VITALS — BP 134/70 | HR 106 | Temp 98.0°F | Resp 16 | Ht 60.0 in | Wt 157.0 lb

## 2021-08-06 DIAGNOSIS — B3731 Acute candidiasis of vulva and vagina: Secondary | ICD-10-CM | POA: Diagnosis not present

## 2021-08-06 DIAGNOSIS — E1122 Type 2 diabetes mellitus with diabetic chronic kidney disease: Secondary | ICD-10-CM

## 2021-08-06 DIAGNOSIS — N1831 Chronic kidney disease, stage 3a: Secondary | ICD-10-CM

## 2021-08-06 DIAGNOSIS — R310 Gross hematuria: Secondary | ICD-10-CM | POA: Insufficient documentation

## 2021-08-06 DIAGNOSIS — Z794 Long term (current) use of insulin: Secondary | ICD-10-CM

## 2021-08-06 LAB — POCT URINALYSIS DIPSTICK
Bilirubin, UA: NEGATIVE
Blood, UA: NEGATIVE
Glucose, UA: NEGATIVE
Ketones, UA: NEGATIVE
Leukocytes, UA: NEGATIVE
Nitrite, UA: NEGATIVE
Protein, UA: NEGATIVE
Spec Grav, UA: 1.015 (ref 1.010–1.025)
Urobilinogen, UA: 0.2 E.U./dL
pH, UA: 5.5 (ref 5.0–8.0)

## 2021-08-06 MED ORDER — FLUCONAZOLE 150 MG PO TABS: 150.0000 mg | ORAL_TABLET | Freq: Once | ORAL | 0 refills | Status: AC

## 2021-08-06 NOTE — Assessment & Plan Note (Signed)
Chronic.  Now followed by endocrine.  Possible vaginal candidiasis connected with Jardiance use.  She has stopped Jardiance after 1 episode of vaginitis.  Dr. Juanita Craver has started her on Prandin.  We will follow over time ?

## 2021-08-06 NOTE — Patient Instructions (Addendum)
Urine is clear! Take one dose of anti-yeast medication. ? Call if symptoms not resolved. ? Keep up with water. ?

## 2021-08-06 NOTE — Progress Notes (Signed)
Patient ID: Debra Leach, female    DOB: 04-27-1951, 71 y.o.   MRN: [id number]  This visit was conducted in person.  BP 134/70    Pulse (!) 106    Temp 98 F (36.7 C)    Resp 16    Ht 5' (1.524 m)    Wt 157 lb (71.2 kg)    SpO2 98%    BMI 30.66 kg/m    CC:  Chief Complaint  Patient presents with   Follow up from UC- UTI   Vaginal itching- Requesting antifungal    Subjective:   HPI: Debra Leach is a 71 y.o. female  with diabetes presenting on 08/06/2021 for Follow up from UC- UTI and Vaginal itching- Requesting antifungal   She was treated with keflex x 5 days for UTI at urgent care on 07/28/21. She had noted blood  on toilet paper, no dysuria, no change in urgency or frequency. No fever. Treated vaginal irritation with terconazole cream  x 6 days    Culture returned with multiple species. All symptoms resolved except now she has external itching at urethral opening which had improved but restarted 24 hours ago.  She has now been seen Dr. Kelton Pillar ENDO. Recently started on Jardiance.  She stopped the jardiance herself given concern of UTI, yeast infection.  ENDO recommend starting.on prandin 0.5 TID.  Reviewed both endocrine and urgent care notes in detail.  Reviewed labs including urine culture results.  Relevant past medical, surgical, family and social history reviewed and updated as indicated. Interim medical history since our last visit reviewed. Allergies and medications reviewed and updated. Outpatient Medications Prior to Visit  Medication Sig Dispense Refill   atorvastatin (LIPITOR) 80 MG tablet TAKE 1 TABLET BY MOUTH DAILY 90 tablet 1   Blood Glucose Monitoring Suppl (ONETOUCH VERIO REFLECT) w/Device KIT 2 (two) times daily. for testing     budesonide (ENTOCORT EC) 3 MG 24 hr capsule Take 2 capsules by mouth daily.     cholecalciferol (VITAMIN D) 1000 units tablet Take 1,000 Units by mouth daily.     colesevelam (WELCHOL) 625 MG tablet TAKE 3  TABLETS BY MOUTH 2 TIMES DAILY WITH A MEAL. 540 tablet 3   Continuous Blood Gluc Sensor (FREESTYLE LIBRE 3 SENSOR) MISC 1 Device by Does not apply route every 14 (fourteen) days. Place 1 sensor on the skin every 14 days. Use to check glucose continuously 6 each 3   ELIQUIS 2.5 MG TABS tablet TAKE 1 TABLET BY MOUTH 2 TIMES DAILY. 180 tablet 0   Eptinezumab-jjmr (VYEPTI) 100 MG/ML injection Inject 300 mg into the vein.     gabapentin (NEURONTIN) 100 MG capsule Take 100 mg by mouth 3 (three) times daily as needed.     gabapentin (NEURONTIN) 300 MG capsule TAKE 2 CAPSULES (600 MG TOTAL) BY MOUTH AT BEDTIME. 180 capsule 0   glucose blood (ONETOUCH VERIO) test strip USE TO CHECK BLOOD SUGAR TWO TIMES A DAY 200 strip 3   Insulin Glargine Solostar (LANTUS) 100 UNIT/ML Solostar Pen 50 units daily     Insulin Pen Needle (ULTICARE SHORT PEN NEEDLES) 31G X 8 MM MISC USE TO INJECT INSULIN DAILY 100 each 3   Lancets (ONETOUCH DELICA PLUS [id number]) MISC USE TO CHECK BLOOD SUGAR TWO TIMES DAILY 200 each 3   losartan-hydrochlorothiazide (HYZAAR) 50-12.5 MG tablet Take 1 tablet by mouth daily. 90 tablet 3   omeprazole (PRILOSEC) 20 MG capsule Take 20 mg by mouth 2 (two) times  daily before a meal.     potassium chloride SA (KLOR-CON M) 20 MEQ tablet TAKE 1 TABLET BY MOUTH DAILY. 90 tablet 0   repaglinide (PRANDIN) 0.5 MG tablet Take 1 tablet (0.5 mg total) by mouth 3 (three) times daily before meals. 90 tablet 1   UBRELVY 100 MG TABS TAKE 1 TABLET BY MOUTH AS NEEDED (MAY REPEAT DOSE AFTER 2 HOURS IF NEEDED. MAXIMUM 2 TABLETS IN 24 HOURS). 10 tablet 3   vitamin B-12 (CYANOCOBALAMIN) 1000 MCG tablet Take 1 tablet by mouth daily.     empagliflozin (JARDIANCE) 10 MG TABS tablet Take 1 tablet (10 mg total) by mouth daily before breakfast. 90 tablet 1   terconazole (TERAZOL 7) 0.4 % vaginal cream Place 1 applicator vaginally at bedtime. 45 g 0   No facility-administered medications prior to visit.     Per HPI unless  specifically indicated in ROS section below Review of Systems  Constitutional:  Negative for fatigue and fever.  HENT:  Negative for congestion.   Eyes:  Negative for pain.  Respiratory:  Negative for cough and shortness of breath.   Cardiovascular:  Negative for chest pain, palpitations and leg swelling.  Gastrointestinal:  Negative for abdominal pain.  Genitourinary:  Negative for dysuria and vaginal bleeding.  Musculoskeletal:  Negative for back pain.  Neurological:  Negative for syncope, light-headedness and headaches.  Psychiatric/Behavioral:  Negative for dysphoric mood.   Objective:  BP 134/70    Pulse (!) 106    Temp 98 F (36.7 C)    Resp 16    Ht 5' (1.524 m)    Wt 157 lb (71.2 kg)    SpO2 98%    BMI 30.66 kg/m   Wt Readings from Last 3 Encounters:  08/06/21 157 lb (71.2 kg)  07/23/21 160 lb 12.8 oz (72.9 kg)  07/10/21 161 lb (73 kg)      Physical Exam Constitutional:      General: She is not in acute distress.    Appearance: Normal appearance. She is well-developed. She is not ill-appearing or toxic-appearing.  HENT:     Head: Normocephalic.     Right Ear: Hearing, tympanic membrane, ear canal and external ear normal. Tympanic membrane is not erythematous, retracted or bulging.     Left Ear: Hearing, tympanic membrane, ear canal and external ear normal. Tympanic membrane is not erythematous, retracted or bulging.     Nose: No mucosal edema or rhinorrhea.     Right Sinus: No maxillary sinus tenderness or frontal sinus tenderness.     Left Sinus: No maxillary sinus tenderness or frontal sinus tenderness.     Mouth/Throat:     Pharynx: Uvula midline.  Eyes:     General: Lids are normal. Lids are everted, no foreign bodies appreciated.     Conjunctiva/sclera: Conjunctivae normal.     Pupils: Pupils are equal, round, and reactive to light.  Neck:     Thyroid: No thyroid mass or thyromegaly.     Vascular: No carotid bruit.     Trachea: Trachea normal.  Cardiovascular:      Rate and Rhythm: Normal rate and regular rhythm.     Pulses: Normal pulses.     Heart sounds: Normal heart sounds, S1 normal and S2 normal. No murmur heard.   No friction rub. No gallop.  Pulmonary:     Effort: Pulmonary effort is normal. No tachypnea or respiratory distress.     Breath sounds: Normal breath sounds. No decreased breath sounds, wheezing,  rhonchi or rales.  Abdominal:     General: Bowel sounds are normal.     Palpations: Abdomen is soft.     Tenderness: There is no abdominal tenderness.  Musculoskeletal:     Cervical back: Normal range of motion and neck supple.  Skin:    General: Skin is warm and dry.     Findings: No rash.  Neurological:     Mental Status: She is alert.  Psychiatric:        Mood and Affect: Mood is not anxious or depressed.        Speech: Speech normal.        Behavior: Behavior normal. Behavior is cooperative.        Thought Content: Thought content normal.        Judgment: Judgment normal.      Results for orders placed or performed during the hospital encounter of 07/28/21  Urine Culture   Specimen: Urine, Clean Catch  Result Value Ref Range   Specimen Description URINE, CLEAN CATCH    Special Requests      NONE Performed at [facility], [address], [place], [place] [address]    Culture MULTIPLE SPECIES PRESENT, SUGGEST RECOLLECTION (A)    Report Status 07/29/2021 FINAL   POCT urinalysis dipstick  Result Value Ref Range   Color, UA yellow yellow   Clarity, UA clear clear   Glucose, UA negative negative mg/dL   Bilirubin, UA negative negative   Ketones, POC UA negative negative mg/dL   Spec Grav, UA <=1.005 (A) 1.010 - 1.025   Blood, UA moderate (A) negative   pH, UA 5.0 5.0 - 8.0   Protein Ur, POC negative negative mg/dL   Urobilinogen, UA 0.2 0.2 or 1.0 E.U./dL   Nitrite, UA Negative Negative   Leukocytes, UA Trace (A) Negative  Cervicovaginal ancillary only  Result Value Ref Range   Neisseria Gonorrhea  Negative    Chlamydia Negative    Trichomonas Negative    Bacterial Vaginitis (gardnerella) Negative    Candida Vaginitis Negative    Candida Glabrata Negative    Comment      Normal Reference Range Bacterial Vaginosis - Negative   Comment Normal Reference Range Candida Species - Negative    Comment Normal Reference Range Candida Galbrata - Negative    Comment Normal Reference Range Trichomonas - Negative    Comment Normal Reference Ranger Chlamydia - Negative    Comment      Normal Reference Range Neisseria Gonorrhea - Negative    This visit occurred during the SARS-CoV-2 public health emergency.  Safety protocols were in place, including screening questions prior to the visit, additional usage of staff PPE, and extensive cleaning of exam room while observing appropriate contact time as indicated for disinfecting solutions.   COVID 19 screen:  No recent travel or known exposure to COVID19 The patient denies respiratory symptoms of COVID 19 at this time. The importance of social distancing was discussed today.   Assessment and Plan Problem List Items Addressed This Visit     Hematuria, gross - Primary    Acute, now resolved.  This was possibly due to not from urine infection given absence of symptoms but instead from vaginal irritation.  Urine culture also returned negative      Relevant Orders   POCT urinalysis dipstick (Completed)   Type 2 diabetes mellitus with stage 3a chronic kidney disease, with long-term current use of insulin (HCC)    Chronic.  Now  followed by endocrine.  Possible vaginal candidiasis connected with Jardiance use.  She has stopped Jardiance after 1 episode of vaginitis.  Dr. Juanita Craver has started her on Prandin.  We will follow over time      Vaginal candidiasis    Acute, improved but persistent  She has had some improvement with terconazole cream but has completed a course.  We will treat with 1 dose of fluconazole 150 mg x 1.      Relevant  Medications   fluconazole (DIFLUCAN) 150 MG tablet       Eliezer Lofts, MD

## 2021-08-06 NOTE — Assessment & Plan Note (Signed)
Acute, now resolved. ? ?This was possibly due to not from urine infection given absence of symptoms but instead from vaginal irritation.  Urine culture also returned negative ?

## 2021-08-06 NOTE — Assessment & Plan Note (Signed)
Acute, improved but persistent ? ?She has had some improvement with terconazole cream but has completed a course.  We will treat with 1 dose of fluconazole 150 mg x 1. ?

## 2021-08-12 ENCOUNTER — Telehealth: Payer: Self-pay | Admitting: Family Medicine

## 2021-08-12 DIAGNOSIS — E1165 Type 2 diabetes mellitus with hyperglycemia: Secondary | ICD-10-CM

## 2021-08-12 DIAGNOSIS — E559 Vitamin D deficiency, unspecified: Secondary | ICD-10-CM

## 2021-08-12 DIAGNOSIS — E041 Nontoxic single thyroid nodule: Secondary | ICD-10-CM

## 2021-08-12 NOTE — Telephone Encounter (Signed)
-----   Message from Ellamae Sia sent at 08/05/2021  3:47 PM EST ----- ?Regarding: lab orders for Monday, 3.13.23 ?Patient is scheduled for CPX labs, please order future labs, Thanks , Terri ? ? ?

## 2021-08-14 ENCOUNTER — Encounter: Payer: Self-pay | Admitting: Internal Medicine

## 2021-08-17 ENCOUNTER — Other Ambulatory Visit (INDEPENDENT_AMBULATORY_CARE_PROVIDER_SITE_OTHER): Payer: Medicare Other

## 2021-08-17 ENCOUNTER — Other Ambulatory Visit: Payer: Self-pay

## 2021-08-17 DIAGNOSIS — E1165 Type 2 diabetes mellitus with hyperglycemia: Secondary | ICD-10-CM | POA: Diagnosis not present

## 2021-08-17 DIAGNOSIS — E041 Nontoxic single thyroid nodule: Secondary | ICD-10-CM

## 2021-08-17 DIAGNOSIS — E559 Vitamin D deficiency, unspecified: Secondary | ICD-10-CM

## 2021-08-17 LAB — LIPID PANEL
Cholesterol: 229 mg/dL — ABNORMAL HIGH (ref 0–200)
HDL: 59.9 mg/dL (ref >39.00)
LDL Cholesterol: 139 mg/dL — ABNORMAL HIGH (ref 0–99)
NonHDL: 168.91
Total CHOL/HDL Ratio: 4
Triglycerides: 151 mg/dL — ABNORMAL HIGH (ref 0.0–149.0)
VLDL: 30.2 mg/dL (ref 0.0–40.0)

## 2021-08-17 LAB — COMPREHENSIVE METABOLIC PANEL
ALT: 14 U/L (ref 0–35)
AST: 13 U/L (ref 0–37)
Albumin: 3.7 g/dL (ref 3.5–5.2)
Alkaline Phosphatase: 70 U/L (ref 39–117)
BUN: 16 mg/dL (ref 6–23)
CO2: 29 mEq/L (ref 19–32)
Calcium: 9.4 mg/dL (ref 8.4–10.5)
Chloride: 105 mEq/L (ref 96–112)
Creatinine, Ser: 0.99 mg/dL (ref 0.40–1.20)
GFR: 57.86 mL/min — ABNORMAL LOW (ref >60.00)
Glucose, Bld: 95 mg/dL (ref 70–99)
Potassium: 4.2 mEq/L (ref 3.5–5.1)
Sodium: 143 mEq/L (ref 135–145)
Total Bilirubin: 0.3 mg/dL (ref 0.2–1.2)
Total Protein: 6.5 g/dL (ref 6.0–8.3)

## 2021-08-17 LAB — HEMOGLOBIN A1C: Hgb A1c MFr Bld: 8.8 % — ABNORMAL HIGH (ref 4.6–6.5)

## 2021-08-17 LAB — TSH: TSH: 0.98 u[IU]/mL (ref 0.35–5.50)

## 2021-08-17 LAB — VITAMIN D 25 HYDROXY (VIT D DEFICIENCY, FRACTURES): VITD: 21.4 ng/mL — ABNORMAL LOW (ref 30.00–100.00)

## 2021-08-18 NOTE — Progress Notes (Signed)
No critical labs need to be addressed urgently. We will discuss labs in detail at upcoming office visit.   

## 2021-08-20 ENCOUNTER — Ambulatory Visit (INDEPENDENT_AMBULATORY_CARE_PROVIDER_SITE_OTHER): Payer: Medicare Other | Admitting: Family Medicine

## 2021-08-20 ENCOUNTER — Other Ambulatory Visit: Payer: Self-pay

## 2021-08-20 ENCOUNTER — Encounter: Payer: Self-pay | Admitting: Family Medicine

## 2021-08-20 VITALS — BP 126/74 | HR 101 | Ht 61.0 in | Wt 156.6 lb

## 2021-08-20 DIAGNOSIS — E785 Hyperlipidemia, unspecified: Secondary | ICD-10-CM

## 2021-08-20 DIAGNOSIS — Z0000 Encounter for general adult medical examination without abnormal findings: Secondary | ICD-10-CM

## 2021-08-20 DIAGNOSIS — I700 Atherosclerosis of aorta: Secondary | ICD-10-CM | POA: Diagnosis not present

## 2021-08-20 DIAGNOSIS — E1122 Type 2 diabetes mellitus with diabetic chronic kidney disease: Secondary | ICD-10-CM

## 2021-08-20 DIAGNOSIS — K861 Other chronic pancreatitis: Secondary | ICD-10-CM | POA: Diagnosis not present

## 2021-08-20 DIAGNOSIS — E1169 Type 2 diabetes mellitus with other specified complication: Secondary | ICD-10-CM | POA: Diagnosis not present

## 2021-08-20 DIAGNOSIS — N1831 Chronic kidney disease, stage 3a: Secondary | ICD-10-CM

## 2021-08-20 DIAGNOSIS — Z794 Long term (current) use of insulin: Secondary | ICD-10-CM

## 2021-08-20 DIAGNOSIS — M8580 Other specified disorders of bone density and structure, unspecified site: Secondary | ICD-10-CM

## 2021-08-20 DIAGNOSIS — E1159 Type 2 diabetes mellitus with other circulatory complications: Secondary | ICD-10-CM

## 2021-08-20 DIAGNOSIS — E559 Vitamin D deficiency, unspecified: Secondary | ICD-10-CM

## 2021-08-20 DIAGNOSIS — Z86711 Personal history of pulmonary embolism: Secondary | ICD-10-CM

## 2021-08-20 DIAGNOSIS — I152 Hypertension secondary to endocrine disorders: Secondary | ICD-10-CM

## 2021-08-20 MED ORDER — VITAMIN D3 1.25 MG (50000 UT) PO CAPS: 1.0000 | ORAL_CAPSULE | ORAL | 3 refills | Status: DC

## 2021-08-20 NOTE — Patient Instructions (Addendum)
Keep working on exercise as able and low cholesterol low carb diet. ? Start Vit  D supplement. ?Please call the location of your choice from the menu below to schedule your Mammogram and/or Bone Density appointment.   ? ?[facility]  ? ?[facility]                ?      Phone:  [phone] ?[address]                               ?[place], [place] [address]                                                             ?Services: Traditional and 3D Mammogram, Bone Density  ? ?[facility]           ?      Phone: [phone] ?[address]                                                       ?[place], [place] [address]    ?Service: Bone Density ONLY  ? *this site does NOT perform mammograms ? ?[facility]                       ? Phone:  [phone] ?[address]                                  ?[place], [place] [address]                                            ?Services:  3D Mammogram and Bone Density  ? ? ?

## 2021-08-20 NOTE — Assessment & Plan Note (Signed)
Well-controlled, chronic ? ?losartan HCTZ 50/12.5 mg daily ? ?

## 2021-08-20 NOTE — Assessment & Plan Note (Signed)
Followed by hematology on Eliquis. ?

## 2021-08-20 NOTE — Assessment & Plan Note (Signed)
Chronic, stable control on lifelong budesonide low-dose. ?

## 2021-08-20 NOTE — Assessment & Plan Note (Signed)
Chronic, improving control ? ?Doing much better on combination of insulin and Prandin twice daily.  Endocrinology following every 3 months. ?

## 2021-08-20 NOTE — Progress Notes (Signed)
? ? Patient ID: Debra Leach, female    DOB: 1950-09-15, 71 y.o.   MRN: [id number] ? ?This visit was conducted in person. ? ?BP 126/74   Pulse (!) 101   Ht _0  (1.549 m)   Wt 156 lb 9.6 oz (71 kg)   SpO2 97%   BMI 29.59 kg/m?   ? ?CC:  ?Chief Complaint  ?Patient presents with  ? Annual Exam  ?  Physical , medicare   ? ? ?Subjective:  ? ?HPI: ?Debra Leach is a 71 y.o. female presenting on 08/20/2021 for Annual Exam (Physical , medicare ) ? ?The patient presents for annual medicare wellness, complete physical and review of chronic health problems. He/She also has the following acute concerns today: ? ?I have personally reviewed the Medicare Annual Wellness questionnaire and have noted ?1. The patient's medical and social history ?2. Their use of alcohol, tobacco or illicit drugs ?3. Their current medications and supplements ?4. The patient's functional ability including ADL's, fall risks, home safety risks and hearing or visual ?            impairment. ?5. Diet and physical activities ?6. Evidence for depression or mood disorders ?7.         Updated provider list ?Cognitive evaluation was performed and recorded on pt medicare questionnaire form. ?The patients weight, height, BMI and visual acuity have been recorded in the chart   ?I have made referrals, counseling and provided education to the patient based review of the above and I have provided the pt with a written personalized care plan for preventive services.  ? Documentation of this information was scanned into the electronic record under the media tab. ? ? Advance directives and end of life planning reviewed in detail with patient and documented in EMR. Patient given handout on advance care directives if needed. HCPOA and living will updated if needed. ? ?No falls in last 12 months. ? ?Personnel officer Visit from 08/20/2021 in [place] at [facility]  ?PHQ-2 Total Score 0  ? ?  ? ? ED visit 2/21 with hematuria  likely  due to vaginal irritation and vaginal candidiasis, urine culture normal ? Hematuria resolved at follow up [place] 08/06/21 ? ?Diabetes:Poor control but improving.  Wiill have to be on life long steroid... referred to ENDO 05/2021. Dr. Kelton Pillar, now on  Prandin ( vaginal irritation/UTI with jardiance). INsulin 50 mg daily ?Lab Results  ?Component Value Date  ? HGBA1C 8.8 (H) 08/17/2021  ?Using medications without difficulties: ?Hypoglycemic episodes: Had some lows on # times daily... so   dose lower to 2 times daily. ?Hyperglycemic episodes:  1 after poor diet and 1 randomly. ?Feet problems: no ulcers ?Blood Sugars averaging: 92- 95 ?eye exam within last year: yes ? ?Hypertension Associated with DM: Well controlled on  losartan HCTZ 50/12.5 mg daily ?BP Readings from Last 3 Encounters:  ?08/20/21 126/74  ?08/06/21 134/70  ?07/28/21 (!) 148/76  ?Using medication without problems or lightheadedness:  ?Chest pain with exertion: ?Edema: ?Short of breath: ?Average home BPs: ?Other issues: ? ?Elevated Cholesterol: LDL not at goal  < 70 (  atherosclerosis of aorta) despite lipitor 80 mg daily and welchol 625 mg 3 tabs BID ?Lab Results  ?Component Value Date  ? CHOL 229 (H) 08/17/2021  ? HDL 59.90 08/17/2021  ? LDLCALC 139 (H) 08/17/2021  ? LDLDIRECT 161.0 10/22/2020  ? TRIG 151.0 (H) 08/17/2021  ? CHOLHDL 4 08/17/2021  ?Using medications without problems: ?Muscle aches:  ?  Diet compliance: ?Exercise:  stationary bike ( every other day/10-30 min) given limited  with DOE after PE. ?Other complaints: ?The 10-year ASCVD risk score (Arnett DK, et al., 2019) is: 22.8% ?  Values used to calculate the score: ?    Age: 28 years ?    Sex: Female ?    Is Non-Hispanic African American: No ?    Diabetic: Yes ?    Tobacco smoker: No ?    Systolic Blood Pressure: 595 mmHg ?    Is BP treated: Yes ?    HDL Cholesterol: 59.9 mg/dL ?    Total Cholesterol: 229 mg/dL ? ? ? Vit D:  1000 IU daily. Not well controlled.. will change to 50,000 Units  weekly x 12 months. ? ? Severe migraine: followed by neurology. This limits her diet. ? 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 ?  On Eliquis for DVT/PE history ? ? Vertigo: start vesitibular rehab. Next week. ? ?Patient Care Team: ?[name], MD as PCP - General (Family Medicine) ?Pieter Partridge, DO as Consulting Physician (Neurology) ?Debbora Dus, [facility] as Pharmacist (Pharmacist) ? ENDO S ?[facility] ?  ? ?Relevant past medical, surgical, family and social history reviewed and updated as indicated. Interim medical history since our last visit reviewed. ?Allergies and medications reviewed and updated. ?Outpatient Medications Prior to Visit  ?Medication Sig Dispense Refill  ? atorvastatin (LIPITOR) 80 MG tablet TAKE 1 TABLET BY MOUTH DAILY 90 tablet 1  ? Blood Glucose Monitoring Suppl (ONETOUCH VERIO REFLECT) w/Device KIT 2 (two) times daily. for testing    ? budesonide (ENTOCORT EC) 3 MG 24 hr capsule Take 2 capsules by mouth daily.    ? cholecalciferol (VITAMIN D) 1000 units tablet Take 1,000 Units by mouth daily.    ? colesevelam (WELCHOL) 625 MG tablet TAKE 3 TABLETS BY MOUTH 2 TIMES DAILY WITH A MEAL. 540 tablet 3  ? Continuous Blood Gluc Sensor (FREESTYLE LIBRE 3 SENSOR) MISC 1 Device by Does not apply route every 14 (fourteen) days. Place 1 sensor on the skin every 14 days. Use to check glucose continuously (Patient not taking: Reported on 08/20/2021) 6 each 3  ? ELIQUIS 2.5 MG TABS tablet TAKE 1 TABLET BY MOUTH 2 TIMES DAILY. 180 tablet 0  ? Eptinezumab-jjmr (VYEPTI) 100 MG/ML injection Inject 300 mg into the vein.    ? gabapentin (NEURONTIN) 100 MG capsule Take 100 mg by mouth 3 (three) times daily as needed.    ? gabapentin (NEURONTIN) 300 MG capsule TAKE 2 CAPSULES (600 MG TOTAL) BY MOUTH AT BEDTIME. 180 capsule 0  ? glucose blood (ONETOUCH VERIO) test strip USE TO CHECK BLOOD SUGAR TWO TIMES A DAY 200 strip 3  ? Insulin Glargine Solostar (LANTUS) 100 UNIT/ML  Solostar Pen 50 units daily    ? Insulin Pen Needle (ULTICARE SHORT PEN NEEDLES) 31G X 8 MM MISC USE TO INJECT INSULIN DAILY 100 each 3  ? Lancets (ONETOUCH DELICA PLUS [id number]) MISC USE TO CHECK BLOOD SUGAR TWO TIMES DAILY 200 each 3  ? losartan-hydrochlorothiazide (HYZAAR) 50-12.5 MG tablet Take 1 tablet by mouth daily. 90 tablet 3  ? omeprazole (PRILOSEC) 20 MG capsule Take 20 mg by mouth 2 (two) times daily before a meal.    ? potassium chloride SA (KLOR-CON M) 20 MEQ tablet TAKE 1 TABLET BY MOUTH DAILY. 90 tablet 0  ? repaglinide (PRANDIN) 0.5 MG tablet Take 1 tablet (0.5 mg total) by mouth 3 (three) times  daily before meals. 90 tablet 1  ? UBRELVY 100 MG TABS TAKE 1 TABLET BY MOUTH AS NEEDED (MAY REPEAT DOSE AFTER 2 HOURS IF NEEDED. MAXIMUM 2 TABLETS IN 24 HOURS). 10 tablet 3  ? vitamin B-12 (CYANOCOBALAMIN) 1000 MCG tablet Take 1 tablet by mouth daily.    ? ?No facility-administered medications prior to visit.  ?  ? ?Per HPI unless specifically indicated in ROS section below ?Review of Systems  ?Constitutional:  Negative for fatigue and fever.  ?HENT:  Negative for congestion.   ?Eyes:  Negative for pain.  ?Respiratory:  Negative for cough and shortness of breath.   ?Cardiovascular:  Negative for chest pain, palpitations and leg swelling.  ?Gastrointestinal:  Negative for abdominal pain.  ?Genitourinary:  Negative for dysuria and vaginal bleeding.  ?Musculoskeletal:  Negative for back pain.  ?Neurological:  Negative for syncope, light-headedness and headaches.  ?Psychiatric/Behavioral:  Negative for dysphoric mood.   ?Objective:  ?BP 126/74   Pulse (!) 101   Ht _0  (1.549 m)   Wt 156 lb 9.6 oz (71 kg)   SpO2 97%   BMI 29.59 kg/m?   ?Wt Readings from Last 3 Encounters:  ?08/20/21 156 lb 9.6 oz (71 kg)  ?08/06/21 157 lb (71.2 kg)  ?07/23/21 160 lb 12.8 oz (72.9 kg)  ?  ?  ?Physical Exam ?Constitutional:   ?   General: She is not in acute distress. ?   Appearance: Normal appearance. She is  well-developed. She is not ill-appearing or toxic-appearing.  ?HENT:  ?   Head: Normocephalic.  ?   Right Ear: Hearing, tympanic membrane, ear canal and external ear normal. Tympanic membrane is not erythematous, retracted or bulgi

## 2021-08-20 NOTE — Assessment & Plan Note (Signed)
Chronic, inadequate control despite max dose atorvastatin and WelChol.  She will continue working on low-cholesterol diet and exercise as able.  She is not interested in visiting the lipid clinic at this time. ?

## 2021-08-20 NOTE — Assessment & Plan Note (Signed)
On high dose statin.

## 2021-08-25 ENCOUNTER — Other Ambulatory Visit: Payer: Self-pay

## 2021-08-25 ENCOUNTER — Other Ambulatory Visit: Payer: Self-pay | Admitting: Family Medicine

## 2021-08-25 ENCOUNTER — Encounter: Payer: Self-pay | Admitting: Physical Therapy

## 2021-08-25 ENCOUNTER — Ambulatory Visit: Payer: Medicare Other | Attending: Neurology | Admitting: Physical Therapy

## 2021-08-25 DIAGNOSIS — R42 Dizziness and giddiness: Secondary | ICD-10-CM | POA: Diagnosis present

## 2021-08-25 DIAGNOSIS — R2681 Unsteadiness on feet: Secondary | ICD-10-CM | POA: Insufficient documentation

## 2021-08-25 DIAGNOSIS — Z1231 Encounter for screening mammogram for malignant neoplasm of breast: Secondary | ICD-10-CM

## 2021-08-25 NOTE — Therapy (Addendum)
[facility] ?[place], [place], [address] ?Phone: [phone]   Fax:  [phone] ? ?Physical Therapy Evaluation ? ?Patient Details  ?Name: Debra Leach ?MRN: [id number] ?Date of Birth: Feb 03, 1951 ?Referring Provider (PT): Dr. Tomi Likens ? ? ?Encounter Date: 08/25/2021 ? ? PT End of Session - 08/25/21 0801   ? ? Visit Number 1   ? Number of Visits 9   ? Date for PT Re-Evaluation 10/20/21   ? PT Start Time 0802   ? PT Stop Time 0900   ? PT Time Calculation (min) 58 min   ? Equipment Utilized During Treatment Gait belt   ? Activity Tolerance Patient tolerated treatment well   ? ?  ?  ? ?  ? ? ?Past Medical History:  ?Diagnosis Date  ? Anemia   ? Arthritis   ? osteoarthritis B knees, left hip , right elbow  ? 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 Clotting disorder ([place])   ? Complication of anesthesia   ? difficult waking  ? Diabetes mellitus without complication ([place])   ? Diet and exercise controlled  ? GERD (gastroesophageal reflux disease)   ? Headache   ? History of kidney stones   ? Hyperlipidemia   ? Hypertension   ? MVP (mitral valve prolapse)   ? history of  ? PONV (postoperative nausea and vomiting)   ? ? ?Past Surgical History:  ?Procedure Laterality Date  ? arm surgery    ? BREAST BIOPSY Left   ? BREAST SURGERY  2000  ? breast biopsy (benign)  ? LITHOTRIPSY  08-12-2008  ? stent placed bilaterally  ? PARTIAL HYSTERECTOMY    ? Both ovaries remain, vaginal, for mennorhagia  ? TONSILLECTOMY    ? TUBAL LIGATION  1980  ? ? ?There were no vitals filed for this visit. ? ?OUTPATIENT PHYSICAL THERAPY VESTIBULAR EVALUATION ? ?Patient Name: Debra Leach ?MRN: [id number] ?DOB:[birth year], 71 y.o., female ?Today's Date: 08/25/2021 ? ?PCP: [name], MD ?REFERRING PROVIDER: Pieter Partridge, DO ? ? PT End of Session - 08/25/21 0801   ? ? Visit Number 1   ? Number of Visits 17  ? Date for PT Re-Evaluation 10/20/21   ? PT Start Time 0802   ? PT Stop  Time 0900   ? PT Time Calculation (min) 58 min   ? Equipment Utilized During Treatment Gait belt   ? Activity Tolerance Patient tolerated treatment well   ? ?  ?  ? ?  ? ? ?Past Medical History:  ?Diagnosis Date  ? Anemia   ? Arthritis   ? osteoarthritis B knees, left hip , right elbow  ? 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 Clotting disorder ([place])   ? Complication of anesthesia   ? difficult waking  ? Diabetes mellitus without complication ([place])   ? Diet and exercise controlled  ? GERD (gastroesophageal reflux disease)   ? Headache   ? History of kidney stones   ? Hyperlipidemia   ? Hypertension   ? MVP (mitral valve prolapse)   ? history of  ? PONV (postoperative nausea and vomiting)   ? ?Past Surgical History:  ?Procedure Laterality Date  ? arm surgery    ? BREAST BIOPSY Left   ? BREAST SURGERY  2000  ? breast biopsy (benign)  ? LITHOTRIPSY  08-12-2008  ? stent placed bilaterally  ? PARTIAL HYSTERECTOMY    ? Both ovaries remain, vaginal, for mennorhagia  ? TONSILLECTOMY    ?  TUBAL LIGATION  1980  ? ?Patient Active Problem List  ? Diagnosis Date Noted  ? Vaginal candidiasis 08/06/2021  ? Hematuria, gross 08/06/2021  ? Type 2 diabetes mellitus with stage 3a chronic kidney disease, with long-term current use of insulin ([place]) 07/10/2021  ? Multinodular goiter 07/10/2021  ? Leg cramps 05/08/2021  ? Spondylolisthesis at L4-L5 level 04/24/2021  ? COVID-19 03/23/2021  ? Nausea 08/15/2020  ? Thyroid nodule 04/11/2020  ? Right leg pain 04/11/2020  ? Leg weakness, bilateral 10/30/2019  ? Situational anxiety 10/12/2019  ? AK (actinic keratosis) 09/26/2019  ? Dyspnea on minimal exertion 07/13/2019  ? Stuttering 07/13/2019  ? Hypertension associated with diabetes ([place]) 07/06/2019  ? 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[place]) 07/06/2019  ? Multiple thyroid nodules 03/27/2019  ? DVT, lower extremity, proximal, acute, right ([place]) 03/13/2019  ? History of pulmonary embolism 03/01/2019  ? Leukocytosis 03/01/2019  ? Breast  nodule 03/01/2019  ? Abnormal renal finding 06/29/2018  ? 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 Facial rash 03/01/2017  ? Eustachian tube disorder, bilateral 12/16/2016  ? Lightheadedness 12/16/2016  ? Esophageal dysphagia 11/22/2016  ? Chronic low back pain 10/07/2016  ? Vision changes 08/12/2016  ? Aortic atherosclerosis ([place]) 06/11/2016  ? Supraclavicular fossa fullness 06/08/2016  ? Family history of aortic aneurysm 08/20/2014  ? Vitamin D deficiency 08/20/2014  ? Osteopenia 11/06/2009  ? Hyperlipidemia associated with type 2 diabetes mellitus ([place]) 07/05/2008  ? INSOMNIA, CHRONIC 07/05/2008  ? Migraine with aura 07/05/2008  ? GERD 07/05/2008  ? OSTEOARTHRITIS 07/05/2008  ? MITRAL VALVE PROLAPSE, HX OF 07/05/2008  ? NEPHROLITHIASIS, HX OF 07/05/2008  ? ? ?ONSET DATE: January 2022 ? ?REFERRING DIAG: R42 (ICD-10-CM) - Vertigo R42 (ICD-10-CM) - Dizziness  ? ?THERAPY DIAG:  ?Dizziness and giddiness ? ?SUBJECTIVE:  ? ?SUBJECTIVE STATEMENT: ?  ?Patient states that she has been doing well for a week to about 10 days after discharge from vestibular therapy on 03/10/2021. Pt states she then came down with COVID in mid October 2022 and that afterwards she began to have dizziness again, noticed a decline in her hearing and a marked increase in her migraines. ? ?PERTINENT HISTORY:  ?08/25/21 Patient was seen for vestibular PT services from 01/21/2021 to 03/10/2021.  At the time of discharge patient had achieved all of her goals as written on plan of care and was reporting significant improvement in her symptoms overall.  At the time of discharge, patient reported that she had not been able to trigger her symptoms of dizziness anymore by doing activities that used to aggravate her dizziness.  Patient states after discharge from vestibular therapy she had about a week to 10 days where she was dizzy free, able to drive and able to do her normal activities mostly before she came down with COVID.  Patient states she got Covid  in mid October of 2022, and she states she expected she might get vertigo afterwards. Patient states she saw her neurologist because her migraines got worse after COVID.  Patient reports that she got hearing aids last year that were working well, but then she noticed a decrease in her hearing after COVID.  Patient she has an appt with ENT.  Patient states that she started back in doing her vestibular home exercise program at the beginning of November to see if that would help her dizziness symptoms.  Patient states when she started resuming her home exercise program it was triggering her migraines.  Pt states on the days she does  the exercises she feels better than compared to the days that she does not.  Patient states that she did not feel that there is carryover and no significant improvement in her dizziness symptoms with resuming her HEP. Pt states in the last 4 days the migraines seem to be getting better. 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 Pt states her migraine medication was changed about 1 to 1 1/2 years ago. She is getting a CGRP antagonist infusion every 3 months for the migraines. Pt states the last one she received was in February. Pt states she is back to not driving again unless she is having a good day she will drive short distances. Pt states she is almost back to where she started before vestibular treatment last year.  Patient reports in the whole month of February she did not have a single migraine free day after the last infusion.  Prior to [place] she was able to go about 16 migraine free days per month.  Patient reports that she only had 1 headache in the past 4 days which is back to her pre-COVID level.  In addition, patient states prior to COVID her migraine pain levels were 3-5 /10 and now have been 7 or 8/10. ? ?History from 01/21/2021: Pt reports that she began to have episodes of dizziness in January 2022. Pt describes her dizziness as a rocking  sensation like when getting off a boat and feels like she gets waves of dizziness where she feels like she is moving. Pt describes her dizziness as vertigo, unsteadiness, lightheadedness. Pt's symptom

## 2021-08-27 ENCOUNTER — Ambulatory Visit: Payer: Medicare Other | Admitting: Physical Therapy

## 2021-08-27 ENCOUNTER — Other Ambulatory Visit: Payer: Self-pay

## 2021-08-27 ENCOUNTER — Encounter: Payer: Self-pay | Admitting: Physical Therapy

## 2021-08-27 DIAGNOSIS — R42 Dizziness and giddiness: Secondary | ICD-10-CM

## 2021-08-27 DIAGNOSIS — R2681 Unsteadiness on feet: Secondary | ICD-10-CM

## 2021-08-27 NOTE — Therapy (Addendum)
[facility] ?[place], [place], [address] ?Phone: [phone]   Fax:  [phone] ? ? ?OUTPATIENT PHYSICAL THERAPY VESTIBULAR Treatment ?Patient Name: Debra Leach ?MRN: [id number] ?DOB:09/16/1950, 72 y.o., female ?Today's Date: 08/27/2021 ? ?PCP: [name], MD ?REFERRING PROVIDER: Pieter Partridge, DO ? ? ? ?Past Medical History:  ?Diagnosis Date  ? Anemia   ? Arthritis   ? osteoarthritis B knees, left hip , right elbow  ? 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 Clotting disorder ([place])   ? Complication of anesthesia   ? difficult waking  ? Diabetes mellitus without complication ([place])   ? Diet and exercise controlled  ? GERD (gastroesophageal reflux disease)   ? Headache   ? History of kidney stones   ? Hyperlipidemia   ? Hypertension   ? MVP (mitral valve prolapse)   ? history of  ? PONV (postoperative nausea and vomiting)   ? ?Past Surgical History:  ?Procedure Laterality Date  ? arm surgery    ? BREAST BIOPSY Left   ? BREAST SURGERY  2000  ? breast biopsy (benign)  ? LITHOTRIPSY  08-12-2008  ? stent placed bilaterally  ? PARTIAL HYSTERECTOMY    ? Both ovaries remain, vaginal, for mennorhagia  ? TONSILLECTOMY    ? TUBAL LIGATION  1980  ? ?Patient Active Problem List  ? Diagnosis Date Noted  ? Vaginal candidiasis 08/06/2021  ? Hematuria, gross 08/06/2021  ? Type 2 diabetes mellitus with stage 3a chronic kidney disease, with long-term current use of insulin ([place]) 07/10/2021  ? Multinodular goiter 07/10/2021  ? Leg cramps 05/08/2021  ? Spondylolisthesis at L4-L5 level 04/24/2021  ? COVID-19 03/23/2021  ? Nausea 08/15/2020  ? Thyroid nodule 04/11/2020  ? Right leg pain 04/11/2020  ? Leg weakness, bilateral 10/30/2019  ? Situational anxiety 10/12/2019  ? AK (actinic keratosis) 09/26/2019  ? Dyspnea on minimal exertion 07/13/2019  ? Stuttering 07/13/2019  ? Hypertension associated with diabetes ([place]) 07/06/2019  ? HIstory of Essential (hemorrhagic)  thrombocythemia ([place]) 07/06/2019  ? Multiple thyroid nodules 03/27/2019  ? DVT, lower extremity, proximal, acute, right ([place]) 03/13/2019  ? History of pulmonary embolism 03/01/2019  ? Leukocytosis 03/01/2019  ? Breast nodule 03/01/2019  ? Abnormal renal finding 06/29/2018  ? 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 Facial rash 03/01/2017  ? Eustachian tube disorder, bilateral 12/16/2016  ? Lightheadedness 12/16/2016  ? Esophageal dysphagia 11/22/2016  ? Chronic low back pain 10/07/2016  ? Vision changes 08/12/2016  ? Aortic atherosclerosis ([place]) 06/11/2016  ? Supraclavicular fossa fullness 06/08/2016  ? Family history of aortic aneurysm 08/20/2014  ? Vitamin D deficiency 08/20/2014  ? Osteopenia 11/06/2009  ? Hyperlipidemia associated with type 2 diabetes mellitus ([place]) 07/05/2008  ? INSOMNIA, CHRONIC 07/05/2008  ? Migraine with aura 07/05/2008  ? GERD 07/05/2008  ? OSTEOARTHRITIS 07/05/2008  ? MITRAL VALVE PROLAPSE, HX OF 07/05/2008  ? NEPHROLITHIASIS, HX OF 07/05/2008  ? ? ? PT End of Session - 08/27/21 1110   ? ? Visit Number 2   ? Number of Visits 9   ? Date for PT Re-Evaluation 10/20/21   ? PT Start Time 1108   ? PT stop time 1155  ? PT time calculation (min) 47 min  ? Equipment Utilized During Treatment Gait belt   ? Activity Tolerance Patient tolerated treatment well   ? Behavior During Therapy [facility] for tasks assessed/performed   ? ?  ?  ? ?  ? ? ?ONSET  DATE: January 2022 ? ?REFERRING DIAG: R42 (ICD-10-CM) - Vertigo R42 (ICD-10-CM) - Dizziness  ? ?THERAPY DIAG:  ?Dizziness and giddiness ? ?Unsteadiness on feet ? ?SUBJECTIVE:  ? ?SUBJECTIVE STATEMENT: ?  ?Pt reports she had a headache yesterday evening and is tired today but no headache. Pt reports she did fine with the VOR X 1 and body wall rolls and reports neither exercise brought on dizziness or migraines.  ? ?PERTINENT HISTORY:  ?08/25/21 Patient was seen for vestibular PT services from 01/21/2021 to 03/10/2021.  At the time of discharge patient had  achieved all of her goals as written on plan of care and was reporting significant improvement in her symptoms overall.  At the time of discharge, patient reported that she had not been able to trigger her symptoms of dizziness anymore by doing activities that used to aggravate her dizziness.  Patient states after discharge from vestibular therapy she had about a week to 10 days where she was dizzy free, able to drive and able to do her normal activities mostly before she came down with COVID.  Patient states she got Covid in mid October of 2022, and she states she expected she might get vertigo afterwards. Patient states she saw her neurologist because her migraines got worse after COVID.  Patient reports that she got hearing aids last year that were working well, but then she noticed a decrease in her hearing after COVID.  Patient she has an appt with ENT.  Patient states that she started back in doing her vestibular home exercise program at the beginning of November to see if that would help her dizziness symptoms.  Patient states when she started resuming her home exercise program it was triggering her migraines.  Pt states on the days she does the exercises she feels better than compared to the days that she does not.  Patient states that she did not feel that there is carryover and no significant improvement in her dizziness symptoms with resuming her HEP. Pt states in the last 4 days the migraines seem to be getting better. 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 Pt states her migraine medication was changed about 1 to 1 1/2 years ago. She is getting a CGRP antagonist infusion every 3 months for the migraines. Pt states the last one she received was in February. Pt states she is back to not driving again unless she is having a good day she will drive short distances. Pt states she is almost back to where she started before vestibular treatment last year.  Patient reports  in the whole month of February she did not have a single migraine free day after the last infusion.  Prior to [place] she was able to go about 16 migraine free days per month.  Patient reports that she only had 1 headache in the past 4 days which is back to her pre-COVID level.  In addition, patient states prior to COVID her migraine pain levels were 3-5 /10 and now have been 7 or 8/10. ? ?History from 01/21/2021: Pt reports that she began to have episodes of dizziness in January 2022. Pt describes her dizziness as a rocking sensation like when getting off a boat and feels like she gets waves of dizziness where she feels like she is moving. Pt describes her dizziness as vertigo, unsteadiness, lightheadedness. Pt's symptoms are motion provoked and intermittent in nature. Pt reports that she is getting multiple daily episodes of dizziness that comes  in waves and can last a few minutes to several hours. Pt reports that head turns, body turns, bending over, looking back, quick movements all exacerbate her symptoms.Pt reports that she does not do quick movements in order to decrease her symptoms. Pt reports showering and washing her hair will also bring on her dizziness symptoms. Pt reports that going to sleep at night helps to decrease her symptoms. Pt reports that she does not get dizziness when she is lying in bed. Pt reports that her symptoms of dizziness are worse later in the day. Pt reports that she has seen Dr. Tami Ribas, ENT physician, in regards to her dizziness symptoms and reports that she had VNG testing performed. Pt reports the [place]-Hall[place] testing was negative and that the VNG indicated potential central findings. Pt was referred to Dr. Tomi Likens, neurologist, for further follow-up and was prescribed Meclizine. Pt reports that she began to use a single-point cane in June upon recommendation by Dr. Tami Ribas due to patient's balance deficits. Pt reports that she has had daily episodes of migraines since the age of  34. Pt states that she used to get migraine pain levels of 8-9/10 and reports that now her pain levels are 5/10 since starting on the newer migraine medications in 2018. Pt reports that most days she does h

## 2021-08-31 ENCOUNTER — Other Ambulatory Visit: Payer: Self-pay | Admitting: Family Medicine

## 2021-09-01 ENCOUNTER — Ambulatory Visit: Payer: Medicare Other | Admitting: Physical Therapy

## 2021-09-08 ENCOUNTER — Encounter: Payer: Medicare Other | Admitting: Physical Therapy

## 2021-09-15 ENCOUNTER — Ambulatory Visit: Payer: Medicare Other | Attending: Neurology | Admitting: Physical Therapy

## 2021-09-15 ENCOUNTER — Encounter: Payer: Self-pay | Admitting: Physical Therapy

## 2021-09-15 DIAGNOSIS — R42 Dizziness and giddiness: Secondary | ICD-10-CM | POA: Insufficient documentation

## 2021-09-15 DIAGNOSIS — R2681 Unsteadiness on feet: Secondary | ICD-10-CM | POA: Diagnosis present

## 2021-09-15 NOTE — Therapy (Signed)
[facility] ?[place], [place], [address] ?Phone: [phone]   Fax:  [phone] ? ? ?OUTPATIENT PHYSICAL THERAPY VESTIBULAR Treatment ?Patient Name: Debra Leach ?MRN: [id number] ?DOB:November 14, 1950, 71 y.o., female ?Today's Date: 09/15/2021 ? ?PCP: [name], MD ?REFERRING PROVIDER: Pieter Partridge, DO ? ? PT End of Session - 09/15/21 7893   ? ? Visit Number 3   ? Number of Visits 9   ? Date for PT Re-Evaluation 10/20/21   ? PT Start Time [phone]   ? PT Stop Time 0857   ? PT Time Calculation (min) 47 min   ? Equipment Utilized During Treatment Gait belt   ? Activity Tolerance Patient tolerated treatment well   ? Behavior During Therapy [facility] for tasks assessed/performed   ? ?  ?  ? ?  ? ? ?Past Medical History:  ?Diagnosis Date  ? Anemia   ? Arthritis   ? osteoarthritis B knees, left hip , right elbow  ? 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 Clotting disorder ([place])   ? Complication of anesthesia   ? difficult waking  ? Diabetes mellitus without complication ([place])   ? Diet and exercise controlled  ? GERD (gastroesophageal reflux disease)   ? Headache   ? History of kidney stones   ? Hyperlipidemia   ? Hypertension   ? MVP (mitral valve prolapse)   ? history of  ? PONV (postoperative nausea and vomiting)   ? ?Past Surgical History:  ?Procedure Laterality Date  ? arm surgery    ? BREAST BIOPSY Left   ? BREAST SURGERY  2000  ? breast biopsy (benign)  ? LITHOTRIPSY  08-12-2008  ? stent placed bilaterally  ? PARTIAL HYSTERECTOMY    ? Both ovaries remain, vaginal, for mennorhagia  ? TONSILLECTOMY    ? TUBAL LIGATION  1980  ? ?Patient Active Problem List  ? Diagnosis Date Noted  ? Vaginal candidiasis 08/06/2021  ? Hematuria, gross 08/06/2021  ? Type 2 diabetes mellitus with stage 3a chronic kidney disease, with long-term current use of insulin ([place]) 07/10/2021  ? Multinodular goiter 07/10/2021  ? Leg cramps 05/08/2021  ? Spondylolisthesis at L4-L5 level  04/24/2021  ? COVID-19 03/23/2021  ? Nausea 08/15/2020  ? Thyroid nodule 04/11/2020  ? Right leg pain 04/11/2020  ? Leg weakness, bilateral 10/30/2019  ? Situational anxiety 10/12/2019  ? AK (actinic keratosis) 09/26/2019  ? Dyspnea on minimal exertion 07/13/2019  ? Stuttering 07/13/2019  ? Hypertension associated with diabetes ([place]) 07/06/2019  ? 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[place]) 07/06/2019  ? Multiple thyroid nodules 03/27/2019  ? DVT, lower extremity, proximal, acute, right ([place]) 03/13/2019  ? History of pulmonary embolism 03/01/2019  ? Leukocytosis 03/01/2019  ? Breast nodule 03/01/2019  ? Abnormal renal finding 06/29/2018  ? 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 Facial rash 03/01/2017  ? Eustachian tube disorder, bilateral 12/16/2016  ? Lightheadedness 12/16/2016  ? Esophageal dysphagia 11/22/2016  ? Chronic low back pain 10/07/2016  ? Vision changes 08/12/2016  ? Aortic atherosclerosis ([place]) 06/11/2016  ? Supraclavicular fossa fullness 06/08/2016  ? Family history of aortic aneurysm 08/20/2014  ? Vitamin D deficiency 08/20/2014  ? Osteopenia 11/06/2009  ? Hyperlipidemia associated with type 2 diabetes mellitus ([place]) 07/05/2008  ? INSOMNIA, CHRONIC 07/05/2008  ? Migraine with aura 07/05/2008  ? GERD 07/05/2008  ? OSTEOARTHRITIS 07/05/2008  ? MITRAL VALVE PROLAPSE, HX OF 07/05/2008  ? NEPHROLITHIASIS, HX OF 07/05/2008  ? ? ?ONSET  DATE: January 2022 ? ?REFERRING DIAG: R42 (ICD-10-CM) - Vertigo R42 (ICD-10-CM) - Dizziness  ? ?THERAPY DIAG:  ?Dizziness and giddiness ? ?Unsteadiness on feet ? ?SUBJECTIVE:  ? ?SUBJECTIVE STATEMENT: ?Patient states she got scratched by a cat and a dog on her right hand and then a trash can lid fell down onto her her right hand as well.  Patient's right hand became painful and swollen and she went to the urgent care last week.  Patient was placed on 3 antibiotics including doxycycline and a Z-Pak.  Patient reports that she will finish all of the antibiotics  by next Sunday.  Patient reports the antibiotics are making her nausea and migraines worse.  Patient reports that she has been having migraine pain of 6-7/10 this past week. Patient states she could not exercise Friday and Saturday but that she did all right on Sunday.  Patient states Monday she had a lot of lightheadedness, dizziness and nausea.  Patient states that she started to use her single-point cane because of the increase in her symptoms this past week. ? ? ?PERTINENT HISTORY:  ?08/25/21 Patient was seen for vestibular PT services from 01/21/2021 to 03/10/2021.  At the time of discharge patient had achieved all of her goals as written on plan of care and was reporting significant improvement in her symptoms overall.  At the time of discharge, patient reported that she had not been able to trigger her symptoms of dizziness anymore by doing activities that used to aggravate her dizziness.  Patient states after discharge from vestibular therapy she had about a week to 10 days where she was dizzy free, able to drive and able to do her normal activities mostly before she came down with COVID.  Patient states she got Covid in mid October of 2022, and she states she expected she might get vertigo afterwards. Patient states she saw her neurologist because her migraines got worse after COVID.  Patient reports that she got hearing aids last year that were working well, but then she noticed a decrease in her hearing after COVID.  Patient she has an appt with ENT.  Patient states that she started back in doing her vestibular home exercise program at the beginning of November to see if that would help her dizziness symptoms.  Patient states when she started resuming her home exercise program it was triggering her migraines.  Pt states on the days she does the exercises she feels better than compared to the days that she does not.  Patient states that she did not feel that there is carryover and no significant improvement  in her dizziness symptoms with resuming her HEP. Pt states in the last 4 days the migraines seem to be getting better. 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 Pt states her migraine medication was changed about 1 to 1 1/2 years ago. She is getting a CGRP antagonist infusion every 3 months for the migraines. Pt states the last one she received was in February. Pt states she is back to not driving again unless she is having a good day she will drive short distances. Pt states she is almost back to where she started before vestibular treatment last year.  Patient reports in the whole month of February she did not have a single migraine free day after the last infusion.  Prior to [place] she was able to go about 16 migraine free days per month.  Patient reports that she only had 1  headache in the past 4 days which is back to her pre-COVID level.  In addition, patient states prior to COVID her migraine pain levels were 3-5 /10 and now have been 7 or 8/10. ? ?History from 01/21/2021: Pt reports that she began to have episodes of dizziness in January 2022. Pt describes her dizziness as a rocking sensation like when getting off a boat and feels like she gets waves of dizziness where she feels like she is moving. Pt describes her dizziness as vertigo, unsteadiness, lightheadedness. Pt's symptoms are motion provoked and intermittent in nature. Pt reports that she is getting multiple daily episodes of dizziness that comes in waves and can last a few minutes to several hours. Pt reports that head turns, body turns, bending over, looking back, quick movements all exacerbate her symptoms.Pt reports that she does not do quick movements in order to decrease her symptoms. Pt reports showering and washing her hair will also bring on her dizziness symptoms. Pt reports that going to sleep at night helps to decrease her symptoms. Pt reports that she does not get dizziness when she is lying in bed. Pt  reports that her symptoms of dizziness are worse later in the day. Pt reports that she has seen Dr. Tami Ribas, ENT physician, in regards to her dizziness symptoms and reports that she had VNG testing per

## 2021-09-16 ENCOUNTER — Other Ambulatory Visit: Payer: Self-pay | Admitting: Family Medicine

## 2021-09-17 ENCOUNTER — Ambulatory Visit (INDEPENDENT_AMBULATORY_CARE_PROVIDER_SITE_OTHER): Payer: Medicare Other | Admitting: Family Medicine

## 2021-09-17 ENCOUNTER — Encounter: Payer: Self-pay | Admitting: Family Medicine

## 2021-09-17 VITALS — BP 120/64 | HR 90 | Temp 97.5°F | Resp 14 | Ht 61.0 in | Wt 154.0 lb

## 2021-09-17 DIAGNOSIS — E119 Type 2 diabetes mellitus without complications: Secondary | ICD-10-CM | POA: Diagnosis not present

## 2021-09-17 DIAGNOSIS — L03116 Cellulitis of left lower limb: Secondary | ICD-10-CM | POA: Diagnosis not present

## 2021-09-17 DIAGNOSIS — L02416 Cutaneous abscess of left lower limb: Secondary | ICD-10-CM | POA: Insufficient documentation

## 2021-09-17 MED ORDER — ONDANSETRON HCL 4 MG PO TABS: 4.0000 mg | ORAL_TABLET | Freq: Three times a day (TID) | ORAL | 0 refills | Status: DC | PRN

## 2021-09-17 NOTE — Assessment & Plan Note (Signed)
Chronic, stable control despite ongoing infection 

## 2021-09-17 NOTE — Patient Instructions (Signed)
Continue antibiotics and probiotics. ?

## 2021-09-17 NOTE — Assessment & Plan Note (Addendum)
Acute, improving ? ?Has completed azithromycin to cover for Bartonella.  She is completing doxycycline and Flagyl to cover Pasteurella and other bacteria.  She is penicillin allergic ?She has had significant improvement in her symptoms and now has full range of motion and no pain in hand. ?She is tolerating the antibiotics fairly well.  She does have some nausea and stomach upset in the setting of her chronic pancreatitis.  She continues probiotics to avoid GI upset. ?She is pushing fluids ? ?She is up-to-date with TD on 04/2021 ?

## 2021-09-17 NOTE — Progress Notes (Signed)
? ? Patient ID: Debra Leach, female    DOB: 1950-09-30, 71 y.o.   MRN: [id number] ? ?This visit was conducted in person. ? ?BP 120/64   Pulse 90   Temp (!) 97.5 ?F (36.4 ?C)   Resp 14   Ht '5\' 1"'  (1.549 m)   Wt 154 lb (69.9 kg)   SpO2 99%   BMI 29.10 kg/m?   ? ?CC:  ?Chief Complaint  ?Patient presents with  ? Wound Check  ?  On right hand on the side got bit by her dog on cat scratch on right hand happened on 09/07/21, right middle finger got hit by a metal piece of the trash can lid on 09/09/21.  Went to urgent care x 2 for this. Went on 4/7 and 09/14/21.  ? ? ?Subjective:  ? ?HPI: ?Debra Leach is a 71 y.o. female with diabetes presenting on 09/17/2021 for Wound Check (On right hand on the side got bit by her dog on cat scratch on right hand happened on 09/07/21, right middle finger got hit by a metal piece of the trash can lid on 09/09/21.  Went to urgent care x 2 for this. Went on 4/7 and 09/14/21.) ? ?Dog bite  09/05/2021 on cat scratch from feral cat on 09/07/2021 ?Also had cut on finger  from trash can 09/09/21, 48 hour later redness and decreased ROM. ? Reviewed OV note from urgent Care on 4/7 and 4/10 ?Treated for cellulitis with  doxycycline and flagyl to cover pasturella from dog bite. As she is PCN allergic. ?Also treated with azithromycin to cover for Bartonella from cat scratch ? Was improving at follow up on 3/10 ? ?Today she reports  improvement in pain and redness. ? She has been using probiotic... no diarrhea, some nausea. ? ROM improved. ?No fever. ? No flu like symptoms ? ? Blood sugars control has been stable during the infection. ? ? 04/2021 Td ? ?Relevant past medical, surgical, family and social history reviewed and updated as indicated. Interim medical history since our last visit reviewed. ?Allergies and medications reviewed and updated. ?Outpatient Medications Prior to Visit  ?Medication Sig Dispense Refill  ? atorvastatin (LIPITOR) 80 MG tablet TAKE 1 TABLET BY MOUTH DAILY 90  tablet 1  ? Blood Glucose Monitoring Suppl (ONETOUCH VERIO REFLECT) w/Device KIT 2 (two) times daily. for testing    ? budesonide (ENTOCORT EC) 3 MG 24 hr capsule Take 2 capsules by mouth daily.    ? Cholecalciferol (VITAMIN D3) 1.25 MG (50000 UT) CAPS Take 1 capsule by mouth once a week. 12 capsule 3  ? colesevelam (WELCHOL) 625 MG tablet TAKE 3 TABLETS BY MOUTH 2 TIMES DAILY WITH A MEAL. 540 tablet 3  ? Continuous Blood Gluc Sensor (FREESTYLE LIBRE 3 SENSOR) MISC 1 Device by Does not apply route every 14 (fourteen) days. Place 1 sensor on the skin every 14 days. Use to check glucose continuously 6 each 3  ? doxycycline (VIBRAMYCIN) 100 MG capsule doxycycline hyclate 100 mg capsule ? TAKE 1 CAPSULE BY MOUTH TWICE DAILY FOR 10 DAYS    ? ELIQUIS 2.5 MG TABS tablet TAKE 1 TABLET BY MOUTH 2 TIMES DAILY. 180 tablet 0  ? Eptinezumab-jjmr (VYEPTI) 100 MG/ML injection Inject 300 mg into the vein.    ? gabapentin (NEURONTIN) 100 MG capsule Take 100 mg by mouth 3 (three) times daily as needed.    ? gabapentin (NEURONTIN) 300 MG capsule TAKE 2 CAPSULES (600 MG TOTAL) BY MOUTH AT  BEDTIME. 180 capsule 0  ? glucose blood (ONETOUCH VERIO) test strip USE TO CHECK BLOOD SUGAR TWO TIMES A DAY 200 strip 3  ? insulin glargine (LANTUS SOLOSTAR) 100 UNIT/ML Solostar Pen Inject 50 Units into the skin daily. 60 mL 1  ? Insulin Pen Needle (ULTICARE SHORT PEN NEEDLES) 31G X 8 MM MISC USE TO INJECT INSULIN DAILY 100 each 3  ? Lancets (ONETOUCH DELICA PLUS [id number]) MISC USE TO CHECK BLOOD SUGAR TWO TIMES DAILY 200 each 3  ? losartan-hydrochlorothiazide (HYZAAR) 50-12.5 MG tablet Take 1 tablet by mouth daily. 90 tablet 3  ? metroNIDAZOLE (FLAGYL) 500 MG tablet Take 500 mg by mouth every 8 (eight) hours.    ? omeprazole (PRILOSEC) 20 MG capsule Take 20 mg by mouth 2 (two) times daily before a meal.    ? potassium chloride SA (KLOR-CON M) 20 MEQ tablet TAKE 1 TABLET BY MOUTH DAILY. 90 tablet 0  ? repaglinide (PRANDIN) 0.5 MG tablet Take 1  tablet (0.5 mg total) by mouth 3 (three) times daily before meals. (Patient taking differently: Take 0.5 mg by mouth 2 (two) times daily before a meal.) 90 tablet 1  ? UBRELVY 100 MG TABS TAKE 1 TABLET BY MOUTH AS NEEDED (MAY REPEAT DOSE AFTER 2 HOURS IF NEEDED. MAXIMUM 2 TABLETS IN 24 HOURS). 10 tablet 3  ? vitamin B-12 (CYANOCOBALAMIN) 1000 MCG tablet Take 1 tablet by mouth daily.    ? ?No facility-administered medications prior to visit.  ?  ? ?Per HPI unless specifically indicated in ROS section below ?Review of Systems  ?Constitutional:  Negative for fatigue and fever.  ?HENT:  Negative for ear pain.   ?Eyes:  Negative for pain.  ?Respiratory:  Negative for chest tightness and shortness of breath.   ?Cardiovascular:  Negative for chest pain, palpitations and leg swelling.  ?Gastrointestinal:  Negative for abdominal pain.  ?Genitourinary:  Negative for dysuria.  ?Objective:  ?BP 120/64   Pulse 90   Temp (!) 97.5 ?F (36.4 ?C)   Resp 14   Ht '5\' 1"'  (1.549 m)   Wt 154 lb (69.9 kg)   SpO2 99%   BMI 29.10 kg/m?   ?Wt Readings from Last 3 Encounters:  ?09/17/21 154 lb (69.9 kg)  ?08/20/21 156 lb 9.6 oz (71 kg)  ?08/06/21 157 lb (71.2 kg)  ?  ?  ?Physical Exam ?Constitutional:   ?   General: She is not in acute distress. ?   Appearance: Normal appearance. She is well-developed. She is not ill-appearing or toxic-appearing.  ?HENT:  ?   Head: Normocephalic.  ?   Right Ear: Hearing, tympanic membrane, ear canal and external ear normal. Tympanic membrane is not erythematous, retracted or bulging.  ?   Left Ear: Hearing, tympanic membrane, ear canal and external ear normal. Tympanic membrane is not erythematous, retracted or bulging.  ?   Nose: No mucosal edema or rhinorrhea.  ?   Right Sinus: No maxillary sinus tenderness or frontal sinus tenderness.  ?   Left Sinus: No maxillary sinus tenderness or frontal sinus tenderness.  ?   Mouth/Throat:  ?   Pharynx: Uvula midline.  ?Eyes:  ?   General: Lids are normal. Lids  are everted, no foreign bodies appreciated.  ?   Conjunctiva/sclera: Conjunctivae normal.  ?   Pupils: Pupils are equal, round, and reactive to light.  ?Neck:  ?   Thyroid: No thyroid mass or thyromegaly.  ?   Vascular: No carotid bruit.  ?  Trachea: Trachea normal.  ?Cardiovascular:  ?   Rate and Rhythm: Normal rate and regular rhythm.  ?   Pulses: Normal pulses.  ?   Heart sounds: Normal heart sounds, S1 normal and S2 normal. No murmur heard. ?  No friction rub. No gallop.  ?Pulmonary:  ?   Effort: Pulmonary effort is normal. No tachypnea or respiratory distress.  ?   Breath sounds: Normal breath sounds. No decreased breath sounds, wheezing, rhonchi or rales.  ?Abdominal:  ?   General: Bowel sounds are normal.  ?   Palpations: Abdomen is soft.  ?   Tenderness: There is no abdominal tenderness.  ?Musculoskeletal:  ?   Cervical back: Normal range of motion and neck supple.  ?Skin: ?   General: Skin is warm and dry.  ?   Findings: No rash.  ?Neurological:  ?   Mental Status: She is alert.  ?Psychiatric:     ?   Mood and Affect: Mood is not anxious or depressed.     ?   Speech: Speech normal.     ?   Behavior: Behavior normal. Behavior is cooperative.     ?   Thought Content: Thought content normal.     ?   Judgment: Judgment normal.  ? ? ?   ?Results for orders placed or performed in visit on 08/17/21  ?VITAMIN D 25 Hydroxy (Vit-D Deficiency, Fractures)  ?Result Value Ref Range  ? VITD 21.40 (L) 30.00 - 100.00 ng/mL  ?TSH  ?Result Value Ref Range  ? TSH 0.98 0.35 - 5.50 uIU/mL  ?Comprehensive metabolic panel  ?Result Value Ref Range  ? Sodium 143 135 - 145 mEq/L  ? Potassium 4.2 3.5 - 5.1 mEq/L  ? Chloride 105 96 - 112 mEq/L  ? CO2 29 19 - 32 mEq/L  ? Glucose, Bld 95 70 - 99 mg/dL  ? BUN 16 6 - 23 mg/dL  ? Creatinine, Ser 0.99 0.40 - 1.20 mg/dL  ? Total Bilirubin 0.3 0.2 - 1.2 mg/dL  ? Alkaline Phosphatase 70 39 - 117 U/L  ? AST 13 0 - 37 U/L  ? ALT 14 0 - 35 U/L  ? Total Protein 6.5 6.0 - 8.3 g/dL  ? Albumin 3.7  3.5 - 5.2 g/dL  ? GFR 57.86 (L) >60.00 mL/min  ? Calcium 9.4 8.4 - 10.5 mg/dL  ?Lipid panel  ?Result Value Ref Range  ? Cholesterol 229 (H) 0 - 200 mg/dL  ? Triglycerides 151.0 (H) 0.0 - 149.0 mg/dL  ? HDL 59.90 >39.00 mg/d

## 2021-09-22 ENCOUNTER — Ambulatory Visit: Payer: Medicare Other | Admitting: Physical Therapy

## 2021-09-22 ENCOUNTER — Encounter: Payer: Self-pay | Admitting: Physical Therapy

## 2021-09-22 DIAGNOSIS — R2681 Unsteadiness on feet: Secondary | ICD-10-CM

## 2021-09-22 DIAGNOSIS — R42 Dizziness and giddiness: Secondary | ICD-10-CM

## 2021-09-22 NOTE — Therapy (Signed)
[place] ?[place], [place], [address] ?Phone: [phone]   Fax:  [phone] ? ? ?OUTPATIENT PHYSICAL THERAPY VESTIBULAR Treatment ?Patient Name: Debra Leach ?MRN: [id number] ?DOB:1951/02/11, 71 y.o., female ?Today's Date: 09/22/2021 ? ?PCP: [name], MD ?REFERRING PROVIDER: Pieter Partridge, DO ? ? PT End of Session - 09/22/21 6378   ? ? Visit Number 4   ? Number of Visits 9   ? Date for PT Re-Evaluation 10/20/21   ? PT Start Time [phone]   ? PT Stop Time 0906   ? PT Time Calculation (min) 55 min   ? Equipment Utilized During Treatment Gait belt   ? Activity Tolerance Patient tolerated treatment well   ? Behavior During Therapy [facility] for tasks assessed/performed   ? ?  ?  ? ?  ? ?Past Medical History:  ?Diagnosis Date  ? Anemia   ? Arthritis   ? osteoarthritis B knees, left hip , right elbow  ? 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 Clotting disorder ([place])   ? Complication of anesthesia   ? difficult waking  ? Diabetes mellitus without complication ([place])   ? Diet and exercise controlled  ? GERD (gastroesophageal reflux disease)   ? Headache   ? History of kidney stones   ? Hyperlipidemia   ? Hypertension   ? MVP (mitral valve prolapse)   ? history of  ? PONV (postoperative nausea and vomiting)   ? ?Past Surgical History:  ?Procedure Laterality Date  ? arm surgery    ? BREAST BIOPSY Left   ? BREAST SURGERY  2000  ? breast biopsy (benign)  ? LITHOTRIPSY  08-12-2008  ? stent placed bilaterally  ? PARTIAL HYSTERECTOMY    ? Both ovaries remain, vaginal, for mennorhagia  ? TONSILLECTOMY    ? TUBAL LIGATION  1980  ? ?Patient Active Problem List  ? Diagnosis Date Noted  ? Cellulitis of left lower extremity 09/17/2021  ? Diabetes mellitus with no complication ([place]) 02/77/4128  ? Vaginal candidiasis 08/06/2021  ? Hematuria, gross 08/06/2021  ? Type 2 diabetes mellitus with stage 3a chronic kidney disease, with long-term current use of insulin ([place])  07/10/2021  ? Multinodular goiter 07/10/2021  ? Leg cramps 05/08/2021  ? Spondylolisthesis at L4-L5 level 04/24/2021  ? COVID-19 03/23/2021  ? Nausea 08/15/2020  ? Thyroid nodule 04/11/2020  ? Right leg pain 04/11/2020  ? Leg weakness, bilateral 10/30/2019  ? Situational anxiety 10/12/2019  ? AK (actinic keratosis) 09/26/2019  ? Dyspnea on minimal exertion 07/13/2019  ? Stuttering 07/13/2019  ? Hypertension associated with diabetes ([place]) 07/06/2019  ? 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[place]) 07/06/2019  ? Multiple thyroid nodules 03/27/2019  ? DVT, lower extremity, proximal, acute, right ([place]) 03/13/2019  ? History of pulmonary embolism 03/01/2019  ? Leukocytosis 03/01/2019  ? Breast nodule 03/01/2019  ? Abnormal renal finding 06/29/2018  ? 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 Facial rash 03/01/2017  ? Eustachian tube disorder, bilateral 12/16/2016  ? Lightheadedness 12/16/2016  ? Esophageal dysphagia 11/22/2016  ? Chronic low back pain 10/07/2016  ? Vision changes 08/12/2016  ? Aortic atherosclerosis ([place]) 06/11/2016  ? Supraclavicular fossa fullness 06/08/2016  ? Family history of aortic aneurysm 08/20/2014  ? Vitamin D deficiency 08/20/2014  ? Osteopenia 11/06/2009  ? Hyperlipidemia associated with type 2 diabetes mellitus ([place]) 07/05/2008  ? INSOMNIA, CHRONIC 07/05/2008  ? Migraine with aura 07/05/2008  ? GERD 07/05/2008  ? OSTEOARTHRITIS 07/05/2008  ?  MITRAL VALVE PROLAPSE, HX OF 07/05/2008  ? NEPHROLITHIASIS, HX OF 07/05/2008  ? ?ONSET DATE: January 2022 ? ?REFERRING DIAG: R42 (ICD-10-CM) - Vertigo R42 (ICD-10-CM) - Dizziness  ? ?THERAPY DIAG:  ?Dizziness and giddiness ? ?Unsteadiness on feet ? ?SUBJECTIVE:  ? ?PERTINENT HISTORY:  ?08/25/21 Patient was seen for vestibular PT services from 01/21/2021 to 03/10/2021.  At the time of discharge patient had achieved all of her goals as written on plan of care and was reporting significant improvement in her symptoms overall.  At the time of  discharge, patient reported that she had not been able to trigger her symptoms of dizziness anymore by doing activities that used to aggravate her dizziness.  Patient states after discharge from vestibular therapy she had about a week to 10 days where she was dizzy free, able to drive and able to do her normal activities mostly before she came down with COVID.  Patient states she got Covid in mid October of 2022, and she states she expected she might get vertigo afterwards. Patient states she saw her neurologist because her migraines got worse after COVID.  Patient reports that she got hearing aids last year that were working well, but then she noticed a decrease in her hearing after COVID.  Patient she has an appt with ENT.  Patient states that she started back in doing her vestibular home exercise program at the beginning of November to see if that would help her dizziness symptoms.  Patient states when she started resuming her home exercise program it was triggering her migraines.  Pt states on the days she does the exercises she feels better than compared to the days that she does not.  Patient states that she did not feel that there is carryover and no significant improvement in her dizziness symptoms with resuming her HEP. Pt states in the last 4 days the migraines seem to be getting better. 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 Pt states her migraine medication was changed about 1 to 1 1/2 years ago. She is getting a CGRP antagonist infusion every 3 months for the migraines. Pt states the last one she received was in February. Pt states she is back to not driving again unless she is having a good day she will drive short distances. Pt states she is almost back to where she started before vestibular treatment last year.  Patient reports in the whole month of February she did not have a single migraine free day after the last infusion.  Prior to [place] she was able to go  about 16 migraine free days per month.  Patient reports that she only had 1 headache in the past 4 days which is back to her pre-COVID level.  In addition, patient states prior to COVID her migraine pain levels were 3-5 /10 and now have been 7 or 8/10. ? ?History from 01/21/2021: Pt reports that she began to have episodes of dizziness in January 2022. Pt describes her dizziness as a rocking sensation like when getting off a boat and feels like she gets waves of dizziness where she feels like she is moving. Pt describes her dizziness as vertigo, unsteadiness, lightheadedness. Pt's symptoms are motion provoked and intermittent in nature. Pt reports that she is getting multiple daily episodes of dizziness that comes in waves and can last a few minutes to several hours. Pt reports that head turns, body turns, bending over, looking back, quick movements all exacerbate her symptoms.Pt reports  that she does not do quick movements in order to decrease her symptoms. Pt reports showering and washing her hair will also bring on her dizziness symptoms. Pt reports that going to sleep at night helps to decrease her symptoms. Pt reports that she does not get dizziness when she is lying in bed. Pt reports that her symptoms of dizziness are worse later in the day. Pt reports that she has seen Dr. Tami Ribas, ENT physician, in regards to her dizziness symptoms and reports that she had VNG testing performed. Pt reports the Dix-Hallpike testing was negative and that the VNG indicated potential central findings. Pt was referred to Dr. Tomi Likens, neurologist, for further follow-up and was prescribed Meclizine. Pt reports that she began to use a single-point cane in June upon recommendation by Dr. Tami Ribas due to patient's balance deficits. Pt reports that she has had daily episodes of migraines since the age of 61. Pt states that she used to get migraine pain levels of 8-9/10 and reports that now her pain levels are 5/10 since starting on the  newer migraine medications in 2018. Pt reports that most days she does have migraines but reports that they are significantly decreased in intensity, duration and frequency since starting the new medications.

## 2021-09-28 ENCOUNTER — Ambulatory Visit: Payer: Medicare Other | Admitting: Physical Therapy

## 2021-09-28 ENCOUNTER — Encounter: Payer: Self-pay | Admitting: Physical Therapy

## 2021-09-28 DIAGNOSIS — R2681 Unsteadiness on feet: Secondary | ICD-10-CM

## 2021-09-28 DIAGNOSIS — R42 Dizziness and giddiness: Secondary | ICD-10-CM | POA: Diagnosis not present

## 2021-09-28 LAB — HM DIABETES EYE EXAM

## 2021-09-28 NOTE — Therapy (Signed)
[place] ?[facility] ?[place], [place], [address] ?Phone: [phone]   Fax:  [phone] ? ? ?OUTPATIENT PHYSICAL THERAPY VESTIBULAR Treatment ?Patient Name: Debra Leach ?MRN: [id number] ?DOB:August 02, 1950, 71 y.o., female ?Today's Date: 09/28/2021 ? ?PCP: [name], MD ?REFERRING PROVIDER: Pieter Partridge, DO ? ? PT End of Session - 09/28/21 1352   ? ? Visit Number 5   ? Number of Visits 9   ? Date for PT Re-Evaluation 10/20/21   ? PT Start Time 0815   ? PT Stop Time 0901   ? PT Time Calculation (min) 46 min   ? Equipment Utilized During Treatment Gait belt   ? Activity Tolerance Patient tolerated treatment well   ? Behavior During Therapy [facility] for tasks assessed/performed   ? ?  ?  ? ?  ? ? ?Past Medical History:  ?Diagnosis Date  ? Anemia   ? Arthritis   ? osteoarthritis B knees, left hip , right elbow  ? 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 Clotting disorder ([place])   ? Complication of anesthesia   ? difficult waking  ? Diabetes mellitus without complication ([place])   ? Diet and exercise controlled  ? GERD (gastroesophageal reflux disease)   ? Headache   ? History of kidney stones   ? Hyperlipidemia   ? Hypertension   ? MVP (mitral valve prolapse)   ? history of  ? PONV (postoperative nausea and vomiting)   ? ?Past Surgical History:  ?Procedure Laterality Date  ? arm surgery    ? BREAST BIOPSY Left   ? BREAST SURGERY  2000  ? breast biopsy (benign)  ? LITHOTRIPSY  08-12-2008  ? stent placed bilaterally  ? PARTIAL HYSTERECTOMY    ? Both ovaries remain, vaginal, for mennorhagia  ? TONSILLECTOMY    ? TUBAL LIGATION  1980  ? ?Patient Active Problem List  ? Diagnosis Date Noted  ? Cellulitis of left lower extremity 09/17/2021  ? Diabetes mellitus with no complication ([place]) 18/56/3149  ? Vaginal candidiasis 08/06/2021  ? Hematuria, gross 08/06/2021  ? Type 2 diabetes mellitus with stage 3a chronic kidney disease, with long-term current use of insulin ([place])  07/10/2021  ? Multinodular goiter 07/10/2021  ? Leg cramps 05/08/2021  ? Spondylolisthesis at L4-L5 level 04/24/2021  ? COVID-19 03/23/2021  ? Nausea 08/15/2020  ? Thyroid nodule 04/11/2020  ? Right leg pain 04/11/2020  ? Leg weakness, bilateral 10/30/2019  ? Situational anxiety 10/12/2019  ? AK (actinic keratosis) 09/26/2019  ? Dyspnea on minimal exertion 07/13/2019  ? Stuttering 07/13/2019  ? Hypertension associated with diabetes ([place]) 07/06/2019  ? 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[place]) 07/06/2019  ? Multiple thyroid nodules 03/27/2019  ? DVT, lower extremity, proximal, acute, right ([place]) 03/13/2019  ? History of pulmonary embolism 03/01/2019  ? Leukocytosis 03/01/2019  ? Breast nodule 03/01/2019  ? Abnormal renal finding 06/29/2018  ? 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 Facial rash 03/01/2017  ? Eustachian tube disorder, bilateral 12/16/2016  ? Lightheadedness 12/16/2016  ? Esophageal dysphagia 11/22/2016  ? Chronic low back pain 10/07/2016  ? Vision changes 08/12/2016  ? Aortic atherosclerosis ([place]) 06/11/2016  ? Supraclavicular fossa fullness 06/08/2016  ? Family history of aortic aneurysm 08/20/2014  ? Vitamin D deficiency 08/20/2014  ? Osteopenia 11/06/2009  ? Hyperlipidemia associated with type 2 diabetes mellitus ([place]) 07/05/2008  ? INSOMNIA, CHRONIC 07/05/2008  ? Migraine with aura 07/05/2008  ? GERD 07/05/2008  ? OSTEOARTHRITIS 07/05/2008  ?  MITRAL VALVE PROLAPSE, HX OF 07/05/2008  ? NEPHROLITHIASIS, HX OF 07/05/2008  ? ?ONSET DATE: January 2022 ? ?REFERRING DIAG: R42 (ICD-10-CM) - Vertigo R42 (ICD-10-CM) - Dizziness  ? ?THERAPY DIAG:  ?Unsteadiness on feet ? ?Dizziness and giddiness ? ?SUBJECTIVE:  ? ?PERTINENT HISTORY:  ?08/25/21 Patient was seen for vestibular PT services from 01/21/2021 to 03/10/2021.  At the time of discharge patient had achieved all of her goals as written on plan of care and was reporting significant improvement in her symptoms overall.  At the time of  discharge, patient reported that she had not been able to trigger her symptoms of dizziness anymore by doing activities that used to aggravate her dizziness.  Patient states after discharge from vestibular therapy she had about a week to 10 days where she was dizzy free, able to drive and able to do her normal activities mostly before she came down with COVID.  Patient states she got Covid in mid October of 2022, and she states she expected she might get vertigo afterwards. Patient states she saw her neurologist because her migraines got worse after COVID.  Patient reports that she got hearing aids last year that were working well, but then she noticed a decrease in her hearing after COVID.  Patient she has an appt with ENT.  Patient states that she started back in doing her vestibular home exercise program at the beginning of November to see if that would help her dizziness symptoms.  Patient states when she started resuming her home exercise program it was triggering her migraines.  Pt states on the days she does the exercises she feels better than compared to the days that she does not.  Patient states that she did not feel that there is carryover and no significant improvement in her dizziness symptoms with resuming her HEP. Pt states in the last 4 days the migraines seem to be getting better. 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 Pt states her migraine medication was changed about 1 to 1 1/2 years ago. She is getting a CGRP antagonist infusion every 3 months for the migraines. Pt states the last one she received was in February. Pt states she is back to not driving again unless she is having a good day she will drive short distances. Pt states she is almost back to where she started before vestibular treatment last year.  Patient reports in the whole month of February she did not have a single migraine free day after the last infusion.  Prior to [place] she was able to go  about 16 migraine free days per month.  Patient reports that she only had 1 headache in the past 4 days which is back to her pre-COVID level.  In addition, patient states prior to COVID her migraine pain levels were 3-5 /10 and now have been 7 or 8/10. ? ?History from 01/21/2021: Pt reports that she began to have episodes of dizziness in January 2022. Pt describes her dizziness as a rocking sensation like when getting off a boat and feels like she gets waves of dizziness where she feels like she is moving. Pt describes her dizziness as vertigo, unsteadiness, lightheadedness. Pt's symptoms are motion provoked and intermittent in nature. Pt reports that she is getting multiple daily episodes of dizziness that comes in waves and can last a few minutes to several hours. Pt reports that head turns, body turns, bending over, looking back, quick movements all exacerbate her symptoms.Pt reports  that she does not do quick movements in order to decrease her symptoms. Pt reports showering and washing her hair will also bring on her dizziness symptoms. Pt reports that going to sleep at night helps to decrease her symptoms. Pt reports that she does not get dizziness when she is lying in bed. Pt reports that her symptoms of dizziness are worse later in the day. Pt reports that she has seen Dr. Tami Ribas, ENT physician, in regards to her dizziness symptoms and reports that she had VNG testing performed. Pt reports the Dix-Hallpike testing was negative and that the VNG indicated potential central findings. Pt was referred to Dr. Tomi Likens, neurologist, for further follow-up and was prescribed Meclizine. Pt reports that she began to use a single-point cane in June upon recommendation by Dr. Tami Ribas due to patient's balance deficits. Pt reports that she has had daily episodes of migraines since the age of 75. Pt states that she used to get migraine pain levels of 8-9/10 and reports that now her pain levels are 5/10 since starting on the  newer migraine medications in 2018. Pt reports that most days she does have migraines but reports that they are significantly decreased in intensity, duration and frequency since starting the new medications

## 2021-09-29 ENCOUNTER — Encounter: Payer: Self-pay | Admitting: Family Medicine

## 2021-09-30 ENCOUNTER — Telehealth: Payer: Self-pay | Admitting: Family Medicine

## 2021-09-30 NOTE — Telephone Encounter (Signed)
Handicap Placard Application completed and placed in Dr. Rometta Emery office for signature.  ?

## 2021-09-30 NOTE — Telephone Encounter (Signed)
Pt husband dropped off paperwork ? ?Type of forms received:disability parking ? ?Routed S[facility]:[id number] ? ?Paperwork received by : terrill ? ? ?Individual made aware of 3-5 business day turn around (Y/N): ?y ?Form completed and patient made aware of charges(Y/N):y ? ? ?Faxed to :  ? ?Form location:  dr Diona Browner box ?

## 2021-10-06 ENCOUNTER — Ambulatory Visit: Payer: Medicare Other | Attending: Neurology | Admitting: Physical Therapy

## 2021-10-06 ENCOUNTER — Other Ambulatory Visit: Payer: Self-pay | Admitting: Neurology

## 2021-10-06 ENCOUNTER — Other Ambulatory Visit: Payer: Self-pay | Admitting: Family Medicine

## 2021-10-06 ENCOUNTER — Encounter: Payer: Self-pay | Admitting: Physical Therapy

## 2021-10-06 DIAGNOSIS — R2681 Unsteadiness on feet: Secondary | ICD-10-CM | POA: Diagnosis present

## 2021-10-06 DIAGNOSIS — R42 Dizziness and giddiness: Secondary | ICD-10-CM | POA: Insufficient documentation

## 2021-10-06 DIAGNOSIS — G8929 Other chronic pain: Secondary | ICD-10-CM

## 2021-10-06 NOTE — Therapy (Signed)
[facility] ?[place], [place], [address] ?Phone: [phone]   Fax:  [phone] ? ? ?OUTPATIENT PHYSICAL THERAPY VESTIBULAR Treatment ?Patient Name: Debra Leach ?MRN: [id number] ?DOB:Nov 26, 1950, 71 y.o., female ?Today's Date: 10/06/2021 ? ?PCP: [name], MD ?REFERRING PROVIDER: Pieter Partridge, DO ? ? PT End of Session - 10/06/21 0802   ? ? Visit Number 6   ? Number of Visits 9   ? Date for PT Re-Evaluation 10/20/21   ? PT Start Time [phone]   ? PT Stop Time 0855   ? PT Time Calculation (min) 49 min   ? Equipment Utilized During Treatment Gait belt   ? Activity Tolerance Patient tolerated treatment well   ? Behavior During Therapy [facility] for tasks assessed/performed   ? ?  ?  ? ?  ? ? ? ?Past Medical History:  ?Diagnosis Date  ? Anemia   ? Arthritis   ? osteoarthritis B knees, left hip , right elbow  ? 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 Clotting disorder ([place])   ? Complication of anesthesia   ? difficult waking  ? Diabetes mellitus without complication ([place])   ? Diet and exercise controlled  ? GERD (gastroesophageal reflux disease)   ? Headache   ? History of kidney stones   ? Hyperlipidemia   ? Hypertension   ? MVP (mitral valve prolapse)   ? history of  ? PONV (postoperative nausea and vomiting)   ? ?Past Surgical History:  ?Procedure Laterality Date  ? arm surgery    ? BREAST BIOPSY Left   ? BREAST SURGERY  2000  ? breast biopsy (benign)  ? LITHOTRIPSY  08-12-2008  ? stent placed bilaterally  ? PARTIAL HYSTERECTOMY    ? Both ovaries remain, vaginal, for mennorhagia  ? TONSILLECTOMY    ? TUBAL LIGATION  1980  ? ?Patient Active Problem List  ? Diagnosis Date Noted  ? Cellulitis of left lower extremity 09/17/2021  ? Diabetes mellitus with no complication ([place]) 66/29/4765  ? Vaginal candidiasis 08/06/2021  ? Hematuria, gross 08/06/2021  ? Type 2 diabetes mellitus with stage 3a chronic kidney disease, with long-term current use of insulin  ([place]) 07/10/2021  ? Multinodular goiter 07/10/2021  ? Leg cramps 05/08/2021  ? Spondylolisthesis at L4-L5 level 04/24/2021  ? COVID-19 03/23/2021  ? Nausea 08/15/2020  ? Thyroid nodule 04/11/2020  ? Right leg pain 04/11/2020  ? Leg weakness, bilateral 10/30/2019  ? Situational anxiety 10/12/2019  ? AK (actinic keratosis) 09/26/2019  ? Dyspnea on minimal exertion 07/13/2019  ? Stuttering 07/13/2019  ? Hypertension associated with diabetes ([place]) 07/06/2019  ? 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[place]) 07/06/2019  ? Multiple thyroid nodules 03/27/2019  ? DVT, lower extremity, proximal, acute, right ([place]) 03/13/2019  ? History of pulmonary embolism 03/01/2019  ? Leukocytosis 03/01/2019  ? Breast nodule 03/01/2019  ? Abnormal renal finding 06/29/2018  ? 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 Facial rash 03/01/2017  ? Eustachian tube disorder, bilateral 12/16/2016  ? Lightheadedness 12/16/2016  ? Esophageal dysphagia 11/22/2016  ? Chronic low back pain 10/07/2016  ? Vision changes 08/12/2016  ? Aortic atherosclerosis ([place]) 06/11/2016  ? Supraclavicular fossa fullness 06/08/2016  ? Family history of aortic aneurysm 08/20/2014  ? Vitamin D deficiency 08/20/2014  ? Osteopenia 11/06/2009  ? Hyperlipidemia associated with type 2 diabetes mellitus ([place]) 07/05/2008  ? INSOMNIA, CHRONIC 07/05/2008  ? Migraine with aura 07/05/2008  ? GERD 07/05/2008  ? OSTEOARTHRITIS 07/05/2008  ?  MITRAL VALVE PROLAPSE, HX OF 07/05/2008  ? NEPHROLITHIASIS, HX OF 07/05/2008  ? ?ONSET DATE: January 2022 ? ?REFERRING DIAG: R42 (ICD-10-CM) - Vertigo R42 (ICD-10-CM) - Dizziness  ? ?THERAPY DIAG:  ?Unsteadiness on feet ? ?Dizziness and giddiness ? ?SUBJECTIVE:  ? ?PERTINENT HISTORY:  ?08/25/21 Patient was seen for vestibular PT services from 01/21/2021 to 03/10/2021.  At the time of discharge patient had achieved all of her goals as written on plan of care and was reporting significant improvement in her symptoms overall.  At the time of  discharge, patient reported that she had not been able to trigger her symptoms of dizziness anymore by doing activities that used to aggravate her dizziness.  Patient states after discharge from vestibular therapy she had about a week to 10 days where she was dizzy free, able to drive and able to do her normal activities mostly before she came down with COVID.  Patient states she got Covid in mid October of 2022, and she states she expected she might get vertigo afterwards. Patient states she saw her neurologist because her migraines got worse after COVID.  Patient reports that she got hearing aids last year that were working well, but then she noticed a decrease in her hearing after COVID.  Patient she has an appt with ENT.  Patient states that she started back in doing her vestibular home exercise program at the beginning of November to see if that would help her dizziness symptoms.  Patient states when she started resuming her home exercise program it was triggering her migraines.  Pt states on the days she does the exercises she feels better than compared to the days that she does not.  Patient states that she did not feel that there is carryover and no significant improvement in her dizziness symptoms with resuming her HEP. Pt states in the last 4 days the migraines seem to be getting better. 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 Pt states her migraine medication was changed about 1 to 1 1/2 years ago. She is getting a CGRP antagonist infusion every 3 months for the migraines. Pt states the last one she received was in February. Pt states she is back to not driving again unless she is having a good day she will drive short distances. Pt states she is almost back to where she started before vestibular treatment last year.  Patient reports in the whole month of February she did not have a single migraine free day after the last infusion.  Prior to [place] she was able to go  about 16 migraine free days per month.  Patient reports that she only had 1 headache in the past 4 days which is back to her pre-COVID level.  In addition, patient states prior to COVID her migraine pain levels were 3-5 /10 and now have been 7 or 8/10. ? ?History from 01/21/2021: Pt reports that she began to have episodes of dizziness in January 2022. Pt describes her dizziness as a rocking sensation like when getting off a boat and feels like she gets waves of dizziness where she feels like she is moving. Pt describes her dizziness as vertigo, unsteadiness, lightheadedness. Pt's symptoms are motion provoked and intermittent in nature. Pt reports that she is getting multiple daily episodes of dizziness that comes in waves and can last a few minutes to several hours. Pt reports that head turns, body turns, bending over, looking back, quick movements all exacerbate her symptoms.Pt reports  that she does not do quick movements in order to decrease her symptoms. Pt reports showering and washing her hair will also bring on her dizziness symptoms. Pt reports that going to sleep at night helps to decrease her symptoms. Pt reports that she does not get dizziness when she is lying in bed. Pt reports that her symptoms of dizziness are worse later in the day. Pt reports that she has seen Dr. Tami Ribas, ENT physician, in regards to her dizziness symptoms and reports that she had VNG testing performed. Pt reports the Dix-Hallpike testing was negative and that the VNG indicated potential central findings. Pt was referred to Dr. Tomi Likens, neurologist, for further follow-up and was prescribed Meclizine. Pt reports that she began to use a single-point cane in June upon recommendation by Dr. Tami Ribas due to patient's balance deficits. Pt reports that she has had daily episodes of migraines since the age of 61. Pt states that she used to get migraine pain levels of 8-9/10 and reports that now her pain levels are 5/10 since starting on the  newer migraine medications in 2018. Pt reports that most days she does have migraines but reports that they are significantly decreased in intensity, duration and frequency since starting the new medication

## 2021-10-06 NOTE — Telephone Encounter (Signed)
Pt's husband picked up form

## 2021-10-07 ENCOUNTER — Encounter: Payer: Self-pay | Admitting: Internal Medicine

## 2021-10-07 ENCOUNTER — Ambulatory Visit (INDEPENDENT_AMBULATORY_CARE_PROVIDER_SITE_OTHER): Payer: Medicare Other | Admitting: Internal Medicine

## 2021-10-07 VITALS — BP 134/80 | HR 100 | Ht 61.0 in | Wt 157.8 lb

## 2021-10-07 DIAGNOSIS — Z794 Long term (current) use of insulin: Secondary | ICD-10-CM | POA: Diagnosis not present

## 2021-10-07 DIAGNOSIS — E1165 Type 2 diabetes mellitus with hyperglycemia: Secondary | ICD-10-CM

## 2021-10-07 DIAGNOSIS — N1831 Chronic kidney disease, stage 3a: Secondary | ICD-10-CM | POA: Diagnosis not present

## 2021-10-07 DIAGNOSIS — E1122 Type 2 diabetes mellitus with diabetic chronic kidney disease: Secondary | ICD-10-CM | POA: Diagnosis not present

## 2021-10-07 MED ORDER — REPAGLINIDE 0.5 MG PO TABS: 0.5000 mg | ORAL_TABLET | Freq: Two times a day (BID) | ORAL | 3 refills | Status: DC

## 2021-10-07 MED ORDER — LANTUS SOLOSTAR 100 UNIT/ML ~~LOC~~ SOPN: 40.0000 [IU] | PEN_INJECTOR | Freq: Every day | SUBCUTANEOUS | 3 refills | Status: DC

## 2021-10-07 NOTE — Patient Instructions (Addendum)
-   Decrease Lantus 40 units daily  ?- Continue Repaglinide 0.5 mg , 1 tablet before Breakfast and Lunch  ? ? ? ?HOW TO TREAT LOW BLOOD SUGARS (Blood sugar LESS THAN 70 MG/DL) ?Please follow the RULE OF 15 for the treatment of hypoglycemia treatment (when your (blood sugars are less than 70 mg/dL)  ? ?STEP 1: Take 15 grams of carbohydrates when your blood sugar is low, which includes:  ?3-4 GLUCOSE TABS  OR ?3-4 OZ OF JUICE OR REGULAR SODA OR ?ONE TUBE OF GLUCOSE GEL   ? ?STEP 2: RECHECK blood sugar in 15 MINUTES ?STEP 3: If your blood sugar is still low at the 15 minute recheck --> then, go back to STEP 1 and treat AGAIN with another 15 grams of carbohydrates. ? ? ?

## 2021-10-07 NOTE — Progress Notes (Signed)
?Name: Debra Leach  ?MRN/ DOB: [id number], 08-27-1950   ?Age/ Sex: 71 y.o., female   ? ?PCP: [name], MD   ?Reason for Endocrinology Evaluation: Type 2 Diabetes Mellitus  ?   ?Date of Initial Endocrinology Visit: 07/10/2021  ? ? ?PATIENT IDENTIFIER: Debra Leach is a 71 y.o. female with a past medical history of DM, renal stones, Autoimmune pancreatitis . The patient presented for initial endocrinology clinic visit on 07/10/2021 for consultative assistance with her diabetes management.  ? ? ?HPI: ?[name] was  ? ? ?Diagnosed with DM 2012 ?Prior Medications tried/Intolerance: Metformin- GI side effects .  ?Currently checking blood sugars 2 x / day ?Hemoglobin A1c has ranged from 6.7% in 2019, peaking at 9.5% in 2022. ? ? ? ?Has limited diet due to migraine headaches she has to eat every 3 hours otherwise her headaches will worsen ? ?She is on long-term glucocorticoids for autoimmune pancreatitis for a long term  started  on 08/2017  ? ?On her initial visit to our clinic she had an A1c of 9.5%, she is intolerant to Jardiance due to recurrent UTIs.  We started her on repaglinide ? ?She was also diagnosed with MNG a few years ago with last ultrasound 04/2020, thyroid nodules did not meet criteria for further follow-up. ? ? ? ?SUBJECTIVE:  ? ?During the last visit (07/10/2021): A1c 9.5%, continued Lantus and started Jardiance ? ?Today (10/07/21): [name] is here for follow-up on diabetes management.  She checks her blood sugars 1-3 times daily, preprandial . The patient has  had hypoglycemic episodes since the last clinic visit. ?She is undergoing physical therapy for vertigo  ?She had an ED visit for vaginal bleed 07/2021 ?She has chronic nausea ?Chronic migraine headaches improving the last 2 weeks  ? ? ? ? ?HOME DIABETES REGIMEN: ?Lantus 50 units daily  ?Repaglinide 0.5 mg before breakfast and lunch ? ? ? ? ?Statin: INtolerant to crestor  ?ACE-I/ARB: yes ?Prior Diabetic Education: no   ? ? ?GLUCOSE LOG:  ? ?58- 268 mg/dL  ? ? ? ? ? ? ?DIABETIC COMPLICATIONS: ?Microvascular complications:  ?CKDIII ?Denies: Neuropathy, retinopathy ?Last eye exam: Completed  ? ?Macrovascular complications:  ? ?Denies: CAD, PVD, CVA ? ? ?PAST HISTORY: ?Past Medical History:  ?Past Medical History:  ?Diagnosis Date  ? Anemia   ? Arthritis   ? osteoarthritis B knees, left hip , right elbow  ? 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 Clotting disorder ([place])   ? Complication of anesthesia   ? difficult waking  ? Diabetes mellitus without complication ([place])   ? Diet and exercise controlled  ? GERD (gastroesophageal reflux disease)   ? Headache   ? History of kidney stones   ? Hyperlipidemia   ? Hypertension   ? MVP (mitral valve prolapse)   ? history of  ? PONV (postoperative nausea and vomiting)   ? ?Past Surgical History:  ?Past Surgical History:  ?Procedure Laterality Date  ? arm surgery    ? BREAST BIOPSY Left   ? BREAST SURGERY  2000  ? breast biopsy (benign)  ? LITHOTRIPSY  08-12-2008  ? stent placed bilaterally  ? PARTIAL HYSTERECTOMY    ? Both ovaries remain, vaginal, for mennorhagia  ? TONSILLECTOMY    ? TUBAL LIGATION  1980  ?  ?Social History:  reports that she has quit smoking. Her smoking use included cigarettes. She has never used smokeless tobacco. She reports that she does not drink alcohol and does  not use drugs. ?Family History:  ?Family History  ?Problem Relation Age of Onset  ? Cancer Mother   ?     bone  ? Hypertension Father   ? Mitral valve prolapse Father   ? Asthma Brother   ? Arthritis Brother   ? Nephrolithiasis Brother   ? Nephrolithiasis Brother   ? Asthma Brother   ? Arthritis Brother   ? Aortic aneurysm Brother   ?     ascending aortic aneuysm  ? Breast cancer Maternal Aunt   ? Breast cancer Maternal Aunt   ? ? ? ?HOME MEDICATIONS: ?Allergies as of 10/07/2021   ? ?   Reactions  ? Atenolol   ? Codeine   ? REACTION: Migraine  ? Ibuprofen   ? Jardiance [empagliflozin]   ? Caused uti  ? Penicillins Itching   ? Has patient had a PCN reaction causing immediate rash, facial/tongue/throat swelling, SOB or lightheadedness with hypotension: No ?Has patient had a PCN reaction causing severe rash involving mucus membranes or skin necrosis: No ?Has patient had a PCN reaction that required hospitalization: No ?Has patient had a PCN reaction occurring within the last 10 years: No ?If all of the above answers are "NO", then may proceed with Cephalosporin use.  ? Pentazocine Lactate   ? REACTION: Swelling, itching, rash  ? Propranolol Other (See Comments)  ? Talwin [pentazocine] Other (See Comments)  ? Swelling and itching   ? Crestor [rosuvastatin] Other (See Comments)  ? Sudafed [pseudoephedrine Hcl] Itching, Anxiety  ? ?  ? ?  ?Medication List  ?  ? ?  ? Accurate as of Oct 07, 2021  9:21 AM. If you have any questions, ask your nurse or doctor.  ?  ?  ? ?  ? ?STOP taking these medications   ? ?doxycycline 100 MG capsule ?Commonly known as: VIBRAMYCIN ?Stopped by: Dorita Sciara, MD ?  ?FreeStyle Libre 3 Sensor Misc ?Stopped by: Dorita Sciara, MD ?  ?metroNIDAZOLE 500 MG tablet ?Commonly known as: FLAGYL ?Stopped by: Dorita Sciara, MD ?  ? ?  ? ?TAKE these medications   ? ?atorvastatin 80 MG tablet ?Commonly known as: LIPITOR ?TAKE 1 TABLET BY MOUTH DAILY ?  ?budesonide 3 MG 24 hr capsule ?Commonly known as: ENTOCORT EC ?Take 2 capsules by mouth daily. ?What changed: Another medication with the same name was removed. Continue taking this medication, and follow the directions you see here. ?Changed by: Dorita Sciara, MD ?  ?colesevelam 625 MG tablet ?Commonly known as: WELCHOL ?TAKE 3 TABLETS BY MOUTH 2 TIMES DAILY WITH A MEAL. ?  ?Eliquis 2.5 MG Tabs tablet ?Generic drug: apixaban ?TAKE 1 TABLET BY MOUTH 2 TIMES DAILY. ?  ?gabapentin 100 MG capsule ?Commonly known as: NEURONTIN ?Take 100 mg by mouth 3 (three) times daily as needed. ?  ?gabapentin 300 MG capsule ?Commonly known as: NEURONTIN ?TAKE 2  CAPSULES (600 MG TOTAL) BY MOUTH AT BEDTIME. ?  ?Lantus SoloStar 100 UNIT/ML Solostar Pen ?Generic drug: insulin glargine ?Inject 50 Units into the skin daily. ?  ?losartan-hydrochlorothiazide 50-12.5 MG tablet ?Commonly known as: HYZAAR ?Take 1 tablet by mouth daily. ?  ?omeprazole 20 MG capsule ?Commonly known as: PRILOSEC ?Take 20 mg by mouth 2 (two) times daily before a meal. ?  ?ondansetron 4 MG tablet ?Commonly known as: Zofran ?Take 1 tablet (4 mg total) by mouth every 8 (eight) hours as needed for nausea or vomiting. ?  ?OneTouch Delica Plus [id number] Misc ?USE  TO CHECK BLOOD SUGAR TWO TIMES DAILY ?  ?OneTouch Verio Reflect w/Device Kit ?2 (two) times daily. for testing ?  ?OneTouch Verio test strip ?Generic drug: glucose blood ?USE TO CHECK BLOOD SUGAR TWO TIMES A DAY ?  ?potassium chloride SA 20 MEQ tablet ?Commonly known as: KLOR-CON M ?TAKE 1 TABLET BY MOUTH DAILY. ?  ?repaglinide 0.5 MG tablet ?Commonly known as: PRANDIN ?Take 0.5 mg by mouth 2 (two) times daily. ?What changed: Another medication with the same name was removed. Continue taking this medication, and follow the directions you see here. ?Changed by: Dorita Sciara, MD ?  ?Ubrelvy 100 MG Tabs ?Generic drug: Ubrogepant ?TAKE 1 TABLET BY MOUTH AS NEEDED (MAY REPEAT DOSE AFTER 2 HOURS IF NEEDED. MAXIMUM 2 TABLETS IN 24 HOURS). ?  ?UltiCare Short Pen Needles 31G X 8 MM Misc ?Generic drug: Insulin Pen Needle ?USE TO INJECT INSULIN DAILY ?  ?vitamin B-12 1000 MCG tablet ?Commonly known as: CYANOCOBALAMIN ?Take 1 tablet by mouth daily. ?  ?Vitamin D3 1.25 MG (50000 UT) Caps ?Take 1 capsule by mouth once a week. ?  ?Vyepti 100 MG/ML injection ?Generic drug: Eptinezumab-jjmr ?Inject 300 mg into the vein. ?  ? ?  ? ? ? ?ALLERGIES: ?Allergies  ?Allergen Reactions  ? Atenolol   ? Codeine   ?  REACTION: Migraine  ? Ibuprofen   ? Jardiance [Empagliflozin]   ?  Caused uti  ? Penicillins Itching  ?  Has patient had a PCN reaction causing immediate  rash, facial/tongue/throat swelling, SOB or lightheadedness with hypotension: No ?Has patient had a PCN reaction causing severe rash involving mucus membranes or skin necrosis: No ?Has patient had a PCN reactio

## 2021-10-12 ENCOUNTER — Encounter: Payer: Self-pay | Admitting: Physical Therapy

## 2021-10-12 ENCOUNTER — Ambulatory Visit: Payer: Medicare Other | Admitting: Physical Therapy

## 2021-10-12 DIAGNOSIS — R42 Dizziness and giddiness: Secondary | ICD-10-CM

## 2021-10-12 DIAGNOSIS — R2681 Unsteadiness on feet: Secondary | ICD-10-CM

## 2021-10-12 NOTE — Therapy (Signed)
[facility] ?[place], [place], [address] ?Phone: [phone]   Fax:  [phone] ? ? ?OUTPATIENT PHYSICAL THERAPY VESTIBULAR Treatment ?Patient Name: Debra Leach ?MRN: [id number] ?DOB:10/11/1950, 71 y.o., female ?Today's Date: 10/12/2021 ? ?PCP: [name], MD ?REFERRING PROVIDER: Pieter Partridge, DO ? ? PT End of Session - 10/14/21 1423   ? ? Visit Number 7   ? Number of Visits 9   ? Date for PT Re-Evaluation 10/20/21   ? PT Start Time 0802   ? PT Stop Time 0845   ? PT Time Calculation (min) 43 min   ? Equipment Utilized During Treatment Gait belt   ? Activity Tolerance Patient tolerated treatment well   ? Behavior During Therapy [facility] for tasks assessed/performed   ? ?  ?  ? ?  ? ? ?Past Medical History:  ?Diagnosis Date  ? Anemia   ? Arthritis   ? osteoarthritis B knees, left hip , right elbow  ? 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 Clotting disorder ([place])   ? Complication of anesthesia   ? difficult waking  ? Diabetes mellitus without complication ([place])   ? Diet and exercise controlled  ? GERD (gastroesophageal reflux disease)   ? Headache   ? History of kidney stones   ? Hyperlipidemia   ? Hypertension   ? MVP (mitral valve prolapse)   ? history of  ? PONV (postoperative nausea and vomiting)   ? ?Past Surgical History:  ?Procedure Laterality Date  ? arm surgery    ? BREAST BIOPSY Left   ? BREAST SURGERY  2000  ? breast biopsy (benign)  ? LITHOTRIPSY  08-12-2008  ? stent placed bilaterally  ? PARTIAL HYSTERECTOMY    ? Both ovaries remain, vaginal, for mennorhagia  ? TONSILLECTOMY    ? TUBAL LIGATION  1980  ? ?Patient Active Problem List  ? Diagnosis Date Noted  ? Cellulitis of left lower extremity 09/17/2021  ? Diabetes mellitus with no complication ([place]) 59/56/3875  ? Vaginal candidiasis 08/06/2021  ? Hematuria, gross 08/06/2021  ? Type 2 diabetes mellitus with stage 3a chronic kidney disease, with long-term current use of insulin ([place])  07/10/2021  ? Multinodular goiter 07/10/2021  ? Leg cramps 05/08/2021  ? Spondylolisthesis at L4-L5 level 04/24/2021  ? COVID-19 03/23/2021  ? Nausea 08/15/2020  ? Thyroid nodule 04/11/2020  ? Right leg pain 04/11/2020  ? Leg weakness, bilateral 10/30/2019  ? Situational anxiety 10/12/2019  ? AK (actinic keratosis) 09/26/2019  ? Dyspnea on minimal exertion 07/13/2019  ? Stuttering 07/13/2019  ? Hypertension associated with diabetes ([place]) 07/06/2019  ? 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[place]) 07/06/2019  ? Multiple thyroid nodules 03/27/2019  ? DVT, lower extremity, proximal, acute, right ([place]) 03/13/2019  ? History of pulmonary embolism 03/01/2019  ? Leukocytosis 03/01/2019  ? Breast nodule 03/01/2019  ? Abnormal renal finding 06/29/2018  ? 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 Facial rash 03/01/2017  ? Eustachian tube disorder, bilateral 12/16/2016  ? Lightheadedness 12/16/2016  ? Esophageal dysphagia 11/22/2016  ? Chronic low back pain 10/07/2016  ? Vision changes 08/12/2016  ? Aortic atherosclerosis ([place]) 06/11/2016  ? Supraclavicular fossa fullness 06/08/2016  ? Family history of aortic aneurysm 08/20/2014  ? Vitamin D deficiency 08/20/2014  ? Osteopenia 11/06/2009  ? Hyperlipidemia associated with type 2 diabetes mellitus ([place]) 07/05/2008  ? INSOMNIA, CHRONIC 07/05/2008  ? Migraine with aura 07/05/2008  ? GERD 07/05/2008  ? OSTEOARTHRITIS 07/05/2008  ?  MITRAL VALVE PROLAPSE, HX OF 07/05/2008  ? NEPHROLITHIASIS, HX OF 07/05/2008  ? ?ONSET DATE: January 2022 ? ?REFERRING DIAG: R42 (ICD-10-CM) - Vertigo R42 (ICD-10-CM) - Dizziness  ? ?THERAPY DIAG:  ?Unsteadiness on feet ? ?Dizziness and giddiness ? ?SUBJECTIVE:  ? ?PERTINENT HISTORY:  ?08/25/21 Patient was seen for vestibular PT services from 01/21/2021 to 03/10/2021.  At the time of discharge patient had achieved all of her goals as written on plan of care and was reporting significant improvement in her symptoms overall.  At the time of  discharge, patient reported that she had not been able to trigger her symptoms of dizziness anymore by doing activities that used to aggravate her dizziness.  Patient states after discharge from vestibular therapy she had about a week to 10 days where she was dizzy free, able to drive and able to do her normal activities mostly before she came down with COVID.  Patient states she got Covid in mid October of 2022, and she states she expected she might get vertigo afterwards. Patient states she saw her neurologist because her migraines got worse after COVID.  Patient reports that she got hearing aids last year that were working well, but then she noticed a decrease in her hearing after COVID.  Patient she has an appt with ENT.  Patient states that she started back in doing her vestibular home exercise program at the beginning of November to see if that would help her dizziness symptoms.  Patient states when she started resuming her home exercise program it was triggering her migraines.  Pt states on the days she does the exercises she feels better than compared to the days that she does not.  Patient states that she did not feel that there is carryover and no significant improvement in her dizziness symptoms with resuming her HEP. Pt states in the last 4 days the migraines seem to be getting better. 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 Pt states her migraine medication was changed about 1 to 1 1/2 years ago. She is getting a CGRP antagonist infusion every 3 months for the migraines. Pt states the last one she received was in February. Pt states she is back to not driving again unless she is having a good day she will drive short distances. Pt states she is almost back to where she started before vestibular treatment last year.  Patient reports in the whole month of February she did not have a single migraine free day after the last infusion.  Prior to [place] she was able to go  about 16 migraine free days per month.  Patient reports that she only had 1 headache in the past 4 days which is back to her pre-COVID level.  In addition, patient states prior to COVID her migraine pain levels were 3-5 /10 and now have been 7 or 8/10. ? ?History from 01/21/2021: Pt reports that she began to have episodes of dizziness in January 2022. Pt describes her dizziness as a rocking sensation like when getting off a boat and feels like she gets waves of dizziness where she feels like she is moving. Pt describes her dizziness as vertigo, unsteadiness, lightheadedness. Pt's symptoms are motion provoked and intermittent in nature. Pt reports that she is getting multiple daily episodes of dizziness that comes in waves and can last a few minutes to several hours. Pt reports that head turns, body turns, bending over, looking back, quick movements all exacerbate her symptoms.Pt reports  that she does not do quick movements in order to decrease her symptoms. Pt reports showering and washing her hair will also bring on her dizziness symptoms. Pt reports that going to sleep at night helps to decrease her symptoms. Pt reports that she does not get dizziness when she is lying in bed. Pt reports that her symptoms of dizziness are worse later in the day. Pt reports that she has seen Dr. Tami Ribas, ENT physician, in regards to her dizziness symptoms and reports that she had VNG testing performed. Pt reports the Dix-Hallpike testing was negative and that the VNG indicated potential central findings. Pt was referred to Dr. Tomi Likens, neurologist, for further follow-up and was prescribed Meclizine. Pt reports that she began to use a single-point cane in June upon recommendation by Dr. Tami Ribas due to patient's balance deficits. Pt reports that she has had daily episodes of migraines since the age of 79. Pt states that she used to get migraine pain levels of 8-9/10 and reports that now her pain levels are 5/10 since starting on the  newer migraine medications in 2018. Pt reports that most days she does have migraines but reports that they are significantly decreased in intensity, duration and frequency since starting the new medications.

## 2021-10-13 ENCOUNTER — Other Ambulatory Visit: Payer: Self-pay | Admitting: Family Medicine

## 2021-10-13 ENCOUNTER — Telehealth: Payer: Self-pay | Admitting: Family Medicine

## 2021-10-13 ENCOUNTER — Encounter: Payer: Medicare Other | Admitting: Physical Therapy

## 2021-10-13 DIAGNOSIS — H539 Unspecified visual disturbance: Secondary | ICD-10-CM

## 2021-10-13 NOTE — Progress Notes (Signed)
m °

## 2021-10-13 NOTE — Telephone Encounter (Signed)
See my chart note.

## 2021-10-16 ENCOUNTER — Encounter: Payer: Self-pay | Admitting: Family Medicine

## 2021-10-16 ENCOUNTER — Other Ambulatory Visit (INDEPENDENT_AMBULATORY_CARE_PROVIDER_SITE_OTHER): Payer: Medicare Other

## 2021-10-16 DIAGNOSIS — H539 Unspecified visual disturbance: Secondary | ICD-10-CM

## 2021-10-16 DIAGNOSIS — R700 Elevated erythrocyte sedimentation rate: Secondary | ICD-10-CM

## 2021-10-16 DIAGNOSIS — R519 Headache, unspecified: Secondary | ICD-10-CM

## 2021-10-16 LAB — SEDIMENTATION RATE: Sed Rate: 102 mm/hr — ABNORMAL HIGH (ref 0–30)

## 2021-10-16 NOTE — Addendum Note (Signed)
Addended byEliezer Lofts E on: 10/16/2021 08:24 AM ? ? Modules accepted: Orders ? ?

## 2021-10-18 ENCOUNTER — Encounter: Payer: Self-pay | Admitting: Family Medicine

## 2021-10-19 DIAGNOSIS — R700 Elevated erythrocyte sedimentation rate: Secondary | ICD-10-CM | POA: Insufficient documentation

## 2021-10-20 ENCOUNTER — Encounter: Payer: Self-pay | Admitting: Physical Therapy

## 2021-10-20 ENCOUNTER — Ambulatory Visit: Payer: Medicare Other | Admitting: Physical Therapy

## 2021-10-20 DIAGNOSIS — R42 Dizziness and giddiness: Secondary | ICD-10-CM

## 2021-10-20 DIAGNOSIS — R2681 Unsteadiness on feet: Secondary | ICD-10-CM

## 2021-10-20 NOTE — Therapy (Signed)
[facility] ?[place], [place], [address] ?Phone: [phone]   Fax:  [phone] ? ? ?OUTPATIENT PHYSICAL THERAPY VESTIBULAR Treatment ?Patient Name: Debra Leach ?MRN: [id number] ?DOB:1950-08-20, 71 y.o., female ?Today's Date: 10/20/2021 ? ?PCP: [name], MD ?REFERRING PROVIDER: Pieter Partridge, DO ? ? PT End of Session - 10/20/21 3532   ? ? Visit Number 8   ? Number of Visits 9   ? Date for PT Re-Evaluation 10/20/21   ? PT Start Time 0815   ? PT Stop Time 0901   ? PT Time Calculation (min) 46 min   ? Equipment Utilized During Treatment Gait belt   ? Activity Tolerance Patient tolerated treatment well   ? Behavior During Therapy [facility] for tasks assessed/performed   ? ?  ?  ? ?  ? ? ?Past Medical History:  ?Diagnosis Date  ? Anemia   ? Arthritis   ? osteoarthritis B knees, left hip , right elbow  ? 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 Clotting disorder ([place])   ? Complication of anesthesia   ? difficult waking  ? Diabetes mellitus without complication ([place])   ? Diet and exercise controlled  ? GERD (gastroesophageal reflux disease)   ? Headache   ? History of kidney stones   ? Hyperlipidemia   ? Hypertension   ? MVP (mitral valve prolapse)   ? history of  ? PONV (postoperative nausea and vomiting)   ? ?Past Surgical History:  ?Procedure Laterality Date  ? arm surgery    ? BREAST BIOPSY Left   ? BREAST SURGERY  2000  ? breast biopsy (benign)  ? LITHOTRIPSY  08-12-2008  ? stent placed bilaterally  ? PARTIAL HYSTERECTOMY    ? Both ovaries remain, vaginal, for mennorhagia  ? TONSILLECTOMY    ? TUBAL LIGATION  1980  ? ?Patient Active Problem List  ? Diagnosis Date Noted  ? Elevated sed rate 10/19/2021  ? Cellulitis of left lower extremity 09/17/2021  ? Diabetes mellitus with no complication ([place]) 99/24/2683  ? Vaginal candidiasis 08/06/2021  ? Hematuria, gross 08/06/2021  ? Type 2 diabetes mellitus with stage 3a chronic kidney disease, with  long-term current use of insulin ([place]) 07/10/2021  ? Multinodular goiter 07/10/2021  ? Leg cramps 05/08/2021  ? Spondylolisthesis at L4-L5 level 04/24/2021  ? COVID-19 03/23/2021  ? Nausea 08/15/2020  ? Thyroid nodule 04/11/2020  ? Right leg pain 04/11/2020  ? Leg weakness, bilateral 10/30/2019  ? Situational anxiety 10/12/2019  ? AK (actinic keratosis) 09/26/2019  ? Dyspnea on minimal exertion 07/13/2019  ? Stuttering 07/13/2019  ? Hypertension associated with diabetes ([place]) 07/06/2019  ? 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[place]) 07/06/2019  ? Multiple thyroid nodules 03/27/2019  ? DVT, lower extremity, proximal, acute, right ([place]) 03/13/2019  ? History of pulmonary embolism 03/01/2019  ? Leukocytosis 03/01/2019  ? Breast nodule 03/01/2019  ? Abnormal renal finding 06/29/2018  ? 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 Facial rash 03/01/2017  ? Eustachian tube disorder, bilateral 12/16/2016  ? Lightheadedness 12/16/2016  ? Esophageal dysphagia 11/22/2016  ? Chronic low back pain 10/07/2016  ? Vision changes 08/12/2016  ? Aortic atherosclerosis ([place]) 06/11/2016  ? Supraclavicular fossa fullness 06/08/2016  ? Family history of aortic aneurysm 08/20/2014  ? Vitamin D deficiency 08/20/2014  ? Osteopenia 11/06/2009  ? Hyperlipidemia associated with type 2 diabetes mellitus ([place]) 07/05/2008  ? INSOMNIA, CHRONIC 07/05/2008  ? Migraine with aura 07/05/2008  ? GERD  07/05/2008  ? OSTEOARTHRITIS 07/05/2008  ? MITRAL VALVE PROLAPSE, HX OF 07/05/2008  ? NEPHROLITHIASIS, HX OF 07/05/2008  ? ?ONSET DATE: January 2022 ? ?REFERRING DIAG: R42 (ICD-10-CM) - Vertigo R42 (ICD-10-CM) - Dizziness  ? ?THERAPY DIAG:  ?Unsteadiness on feet ? ?Dizziness and giddiness ? ?SUBJECTIVE:  ? ?PERTINENT HISTORY:  ?08/25/21 Patient was seen for vestibular PT services from 01/21/2021 to 03/10/2021.  At the time of discharge patient had achieved all of her goals as written on plan of care and was reporting significant improvement in her  symptoms overall.  At the time of discharge, patient reported that she had not been able to trigger her symptoms of dizziness anymore by doing activities that used to aggravate her dizziness.  Patient states after discharge from vestibular therapy she had about a week to 10 days where she was dizzy free, able to drive and able to do her normal activities mostly before she came down with COVID.  Patient states she got Covid in mid October of 2022, and she states she expected she might get vertigo afterwards. Patient states she saw her neurologist because her migraines got worse after COVID.  Patient reports that she got hearing aids last year that were working well, but then she noticed a decrease in her hearing after COVID.  Patient she has an appt with ENT.  Patient states that she started back in doing her vestibular home exercise program at the beginning of November to see if that would help her dizziness symptoms.  Patient states when she started resuming her home exercise program it was triggering her migraines.  Pt states on the days she does the exercises she feels better than compared to the days that she does not.  Patient states that she did not feel that there is carryover and no significant improvement in her dizziness symptoms with resuming her HEP. Pt states in the last 4 days the migraines seem to be getting better. 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 Pt states her migraine medication was changed about 1 to 1 1/2 years ago. She is getting a CGRP antagonist infusion every 3 months for the migraines. Pt states the last one she received was in February. Pt states she is back to not driving again unless she is having a good day she will drive short distances. Pt states she is almost back to where she started before vestibular treatment last year.  Patient reports in the whole month of February she did not have a single migraine free day after the last infusion.   Prior to [place] she was able to go about 16 migraine free days per month.  Patient reports that she only had 1 headache in the past 4 days which is back to her pre-COVID level.  In addition, patient states prior to COVID her migraine pain levels were 3-5 /10 and now have been 7 or 8/10. ? ?History from 01/21/2021: Pt reports that she began to have episodes of dizziness in January 2022. Pt describes her dizziness as a rocking sensation like when getting off a boat and feels like she gets waves of dizziness where she feels like she is moving. Pt describes her dizziness as vertigo, unsteadiness, lightheadedness. Pt's symptoms are motion provoked and intermittent in nature. Pt reports that she is getting multiple daily episodes of dizziness that comes in waves and can last a few minutes to several hours. Pt reports that head turns, body turns, bending over, looking back,  quick movements all exacerbate her symptoms.Pt reports that she does not do quick movements in order to decrease her symptoms. Pt reports showering and washing her hair will also bring on her dizziness symptoms. Pt reports that going to sleep at night helps to decrease her symptoms. Pt reports that she does not get dizziness when she is lying in bed. Pt reports that her symptoms of dizziness are worse later in the day. Pt reports that she has seen Dr. Tami Ribas, ENT physician, in regards to her dizziness symptoms and reports that she had VNG testing performed. Pt reports the Dix-Hallpike testing was negative and that the VNG indicated potential central findings. Pt was referred to Dr. Tomi Likens, neurologist, for further follow-up and was prescribed Meclizine. Pt reports that she began to use a single-point cane in June upon recommendation by Dr. Tami Ribas due to patient's balance deficits. Pt reports that she has had daily episodes of migraines since the age of 74. Pt states that she used to get migraine pain levels of 8-9/10 and reports that now her pain levels  are 5/10 since starting on the newer migraine medications in 2018. Pt reports that most days she does have migraines but reports that they are significantly decreased in intensity, duration and frequency si

## 2021-10-27 ENCOUNTER — Encounter: Payer: Self-pay | Admitting: Physical Therapy

## 2021-10-27 ENCOUNTER — Ambulatory Visit: Payer: Medicare Other | Admitting: Physical Therapy

## 2021-10-27 DIAGNOSIS — R2681 Unsteadiness on feet: Secondary | ICD-10-CM

## 2021-10-27 DIAGNOSIS — R42 Dizziness and giddiness: Secondary | ICD-10-CM

## 2021-10-27 NOTE — Therapy (Signed)
[place] MAIN [facility] SERVICES [address] [place], [place], [address] Phone: [phone]   Fax:  [phone]   OUTPATIENT PHYSICAL THERAPY VESTIBULAR Treatment/Recertification Dates of Services:  08/25/21-10/27/21  Patient Name: Debra Leach MRN: [id number] DOB:June 25, 1950, 71 y.o., female Today's Date: 10/27/2021  PCP: [name], MD REFERRING PROVIDER: Pieter Partridge, DO   PT End of Session - 10/27/21 1031     Visit Number 9    Number of Visits 17    Date for PT Re-Evaluation 12/15/21    PT Start Time 1027    PT Stop Time 1117    PT Time Calculation (min) 50 min    Equipment Utilized During Treatment Gait belt    Activity Tolerance Patient tolerated treatment well    Behavior During Therapy WFL for tasks assessed/performed            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[place])    Clotting disorder ([place])    Complication of anesthesia    difficult waking   Diabetes mellitus without complication ([place])    Diet and exercise controlled   GERD (gastroesophageal reflux disease)    Headache    History of kidney stones    Hyperlipidemia    Hypertension    MVP (mitral valve prolapse)    history of   PONV (postoperative nausea and vomiting)    Past Surgical History:  Procedure Laterality Date   arm surgery     BREAST BIOPSY Left    BREAST SURGERY  2000   breast biopsy (benign)   LITHOTRIPSY  08-12-2008   stent placed bilaterally   PARTIAL HYSTERECTOMY     Both ovaries remain, vaginal, for [place]   Patient Active Problem List   Diagnosis Date Noted   Elevated sed rate 10/19/2021   Cellulitis of left lower extremity 09/17/2021   Diabetes mellitus with no complication ([place]) 69/67/8938   Vaginal candidiasis 08/06/2021   Hematuria, gross 08/06/2021   Type 2 diabetes  mellitus with stage 3a chronic kidney disease, with long-term current use of insulin ([place]) 07/10/2021   Multinodular goiter 07/10/2021   Leg cramps 05/08/2021   Spondylolisthesis at L4-L5 level 04/24/2021   COVID-19 03/23/2021   Nausea 08/15/2020   Thyroid nodule 04/11/2020   Right leg pain 04/11/2020   Leg weakness, bilateral 10/30/2019   Situational anxiety 10/12/2019   AK (actinic keratosis) 09/26/2019   Dyspnea on minimal exertion 07/13/2019   Stuttering 07/13/2019   Hypertension associated with diabetes ([place]) 07/06/2019   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[place]) 07/06/2019   Multiple thyroid nodules 03/27/2019   DVT, lower extremity, proximal, acute, right ([place]) 03/13/2019   History of pulmonary embolism 03/01/2019   Leukocytosis 03/01/2019   Breast nodule 03/01/2019   Abnormal renal finding 06/29/2018   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[place]) 08/16/2017   Facial rash 03/01/2017   Eustachian tube disorder, bilateral 12/16/2016   Lightheadedness 12/16/2016   Esophageal dysphagia 11/22/2016   Chronic low back pain 10/07/2016   Vision changes 08/12/2016   Aortic atherosclerosis ([place]) 06/11/2016   Supraclavicular fossa fullness 06/08/2016   Family history of aortic aneurysm 08/20/2014   Vitamin D deficiency 08/20/2014   Osteopenia 11/06/2009   Hyperlipidemia associated with type 2 diabetes mellitus ([place]) 07/05/2008   INSOMNIA, CHRONIC 07/05/2008   Migraine with  aura 07/05/2008   GERD 07/05/2008   OSTEOARTHRITIS 07/05/2008   MITRAL VALVE PROLAPSE, HX OF 07/05/2008   NEPHROLITHIASIS, HX OF 07/05/2008   ONSET DATE: January 2022  REFERRING DIAG: R42 (ICD-10-CM) - Vertigo R42 (ICD-10-CM) - Dizziness   THERAPY DIAG:  Unsteadiness on feet  Dizziness and giddiness  SUBJECTIVE:   PERTINENT HISTORY:  08/25/21 Patient was seen for vestibular PT services from 01/21/2021 to 03/10/2021.  At the time of discharge patient had achieved all of her goals as written on plan of  care and was reporting significant improvement in her symptoms overall.  At the time of discharge, patient reported that she had not been able to trigger her symptoms of dizziness anymore by doing activities that used to aggravate her dizziness.  Patient states after discharge from vestibular therapy she had about a week to 10 days where she was dizzy free, able to drive and able to do her normal activities mostly before she came down with COVID.  Patient states she got Covid in mid October of 2022, and she states she expected she might get vertigo afterwards. Patient states she saw her neurologist because her migraines got worse after COVID.  Patient reports that she got hearing aids last year that were working well, but then she noticed a decrease in her hearing after COVID.  Patient she has an appt with ENT.  Patient states that she started back in doing her vestibular home exercise program at the beginning of November to see if that would help her dizziness symptoms.  Patient states when she started resuming her home exercise program it was triggering her migraines.  Pt states on the days she does the exercises she feels better than compared to the days that she does not.  Patient states that she did not feel that there is carryover and no significant improvement in her dizziness symptoms with resuming her HEP. Pt states in the last 4 days the migraines seem to be getting better. 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 Pt states her migraine medication was changed about 1 to 1 1/2 years ago. She is getting a CGRP antagonist infusion every 3 months for the migraines. Pt states the last one she received was in February. Pt states she is back to not driving again unless she is having a good day she will drive short distances. Pt states she is almost back to where she started before vestibular treatment last year.  Patient reports in the whole month of February she did not have  a single migraine free day after the last infusion.  Prior to [place] she was able to go about 16 migraine free days per month.  Patient reports that she only had 1 headache in the past 4 days which is back to her pre-COVID level.  In addition, patient states prior to COVID her migraine pain levels were 3-5 /10 and now have been 7 or 8/10.  History from 01/21/2021: Pt reports that she began to have episodes of dizziness in January 2022. Pt describes her dizziness as a rocking sensation like when getting off a boat and feels like she gets waves of dizziness where she feels like she is moving. Pt describes her dizziness as vertigo, unsteadiness, lightheadedness. Pt's symptoms are motion provoked and intermittent in nature. Pt reports that she is getting multiple daily episodes of dizziness that comes in waves and can last a few minutes to several hours. Pt reports that head turns, body  turns, bending over, looking back, quick movements all exacerbate her symptoms.Pt reports that she does not do quick movements in order to decrease her symptoms. Pt reports showering and washing her hair will also bring on her dizziness symptoms. Pt reports that going to sleep at night helps to decrease her symptoms. Pt reports that she does not get dizziness when she is lying in bed. Pt reports that her symptoms of dizziness are worse later in the day. Pt reports that she has seen Dr. Tami Ribas, ENT physician, in regards to her dizziness symptoms and reports that she had VNG testing performed. Pt reports the Dix-Hallpike testing was negative and that the VNG indicated potential central findings. Pt was referred to Dr. Tomi Likens, neurologist, for further follow-up and was prescribed Meclizine. Pt reports that she began to use a single-point cane in June upon recommendation by Dr. Tami Ribas due to patient's balance deficits. Pt reports that she has had daily episodes of migraines since the age of 48. Pt states that she used to get migraine pain  levels of 8-9/10 and reports that now her pain levels are 5/10 since starting on the newer migraine medications in 2018. Pt reports that most days she does have migraines but reports that they are significantly decreased in intensity, duration and frequency since starting the new medications. Pt states she has had 14 migraine free days during this last month and that the migraines were shorter in duration lasting a few hours and less severity. Pt reports that after lunch is when she is more prone to getting migraines and states that she tires more easily after lunch.  Pt reports that the dizziness symptoms do not coincide with her migraines. Pt states that in September 2020 she had 3 blood clots in her lungs and 3 blood clots in her leg.  Pt states that she used to walk 4 to 5 miles a day and was biking 30 minutes on multiple days a week.  SUBJECTIVE STATEMENT: Pt reports she has adjusted to tea instead of coffee and her headaches were better this last week and back to her baseline level of migraines. Pt states she struggled this past week but yesterday was a good day. Pt states she is doing well with all of the home exercises except continues to have a difficult time with slow marching with head turns standing on pillow. Discussed trying slow marching without head turns. Pt reports that she is able to walk on the sand at the beach and that she has started limited driving about 30 miles or less.   PAIN:  Are you having pain?  none  PRECAUTIONS: Fall  PATIENT GOALS: Pt would like to be able to drive, be able to go to her beach house where she has 1 flight of stairs to enter and be able to walk on the sand and to be able to resume her normal daily activities.   OBJECTIVE:    SYMPTOM BEHAVIOR: Non-Vestibular symptoms: changes in hearing, headaches, nausea/vomiting, and migraine symptoms Type of dizziness: Imbalance (Disequilibrium), Unsteady with head/body turns, and Lightheadedness/Faint Frequency:  multiple times a week Duration:  Aggravating factors: Spontaneous and Induced by motion: looking up at the ceiling, bending down to the ground, turning body quickly, and turning head quickly Relieving factors: rest, lying down Progression of symptoms:  reports it was worse until the last week or so   08/27/2021: OCULOMOTOR EXAM:   Ocular Alignment: normal   Ocular ROM: No Limitations   Spontaneous Nystagmus: absent  Gaze-Induced Nystagmus: absent and     Smooth Pursuits: intact and     Saccades: intact and       VESTIBULAR - OCULAR REFLEX:    Slow VOR: Comment: deferred   VOR Cancellation: Comment: deferred   Head-Impulse Test: HIT Right: deferred   Dynamic Visual Acuity:  deferred   VESTIBULAR TREATMENT:  10/27/2021:   [facility] PT Assessment - 11/04/21 0001       Dynamic Gait Index   Level Surface Normal    Change in Gait Speed Normal    Gait with Horizontal Head Turns Normal    Gait with Vertical Head Turns Normal    Gait and Pivot Turn Normal   left turns cause mild dizziness; no dizziness with right turns   Step Over Obstacle Normal    Step Around Obstacles Normal    Steps Mild Impairment    Total Score 23            FUNCTIONAL OUTCOME MEASURES:  Results Comments  ABC Scale 73% Mild falls risk; in need of intervention  DGI 23/24 Safe for community mobility  FOTO 56/100 Decreased functional skills  Repeated functional outcome testing and compared to prior test results.  Discussed and updated goals.   Cone tapping: On firm surface, patient performed foot tapping to cones in series of one and then two targets as called out by therapist with contact guard assistance.  Patient able to perform without upper extremity support.  Issued for home exercise program using canned goods for targets while standing at [employer] for safety.   10/20/2021: Slow Marching: Patient performed on Airex pad, slow marching with  horizontal head turns with 5-second holds 10  repetitions each leg with contact-guard assistance without upper extremity support.  Provided cues to widen base of support to better enable patient to perform this activity at home.  Heel raises: Patient performed 20 repetitions with 3-second holds each leg heel raises with light touch for balance bilaterally on support bar. Patient able to clear heel for full 3 seconds throughout the 20 repetitions while maintaining light touch.  Ambulation with head turns: Patient performed 150 feet of forward ambulation with self-selected random head turns without loss of balance with step through gait pattern maintaining good speed with contact-guard assistance.  Then patient performed 3 repetitions of 150 feet of retroambulation with self-selected random head turns with contact-guard assistance.  Patient had a few episodes of missteps but was able to self-correct and had no overt losses of balances or episodes where she needed to step out for balance.  Patient's gait speed was decreased as well as step length with retroambulation.  Kore balance machine:  On Kore balance machine, worked on [employer] activity several repetitions with contact-guard assistance.  Then performed the penguin racing activity which encourages left or right weight shifting with contact-guard assistance.  10/12/2021: Kore balance machine:  On Nurse, children's, worked on [employer], [employer], Art therapist and limits of stability training activities with contact-guard assistance.  Patient continues to improve with controlling the target speed and direction via patient's weight shifting. With limits of stability and dynamic training, noted patient has more difficulty with controlling and shifting her weight to the left as compared to the right side. This improved with continued practice and pt would benefit from continued trials of the above activities next session.  Diona Foley toss over shoulder: Patient performed multiple 125'  trials of forward and retro ambulation while tossing ball over one shoulder with  return catch over opposite shoulder varying the ball position to head, shoulder and waist level to promote head turning and tilting with contact guard assistance.  10/06/2021: Kore balance machine:  On [employer], worked on [employer], maze square and maze triangle activities with contact-guard assistance.  Patient initially having difficulty controlling the ball target with weight shifting but improved with practice. Then performed the weight scale activity multiple repetitions. Will plan on repeating Kore balance machine next visit  VOR x 2 exercise:  Demonstrated and educated as to VOR X 2. Patient performed VOR X 2 horiz in sitting 1 re[ and standing 2 reps of 1 minute each with verbal cues for technique.  Patient reports brief sensation of lightheadedness and feet shifting slightly but improved as she continued to do the repetition. Issued for home exercise program with handout provided.  PATIENT EDUCATION: Education details: Discussed trying slow marching without head turns. Added cone tapping using canned goods standing at kitchen counter in series of 2 for home exercise program. Person educated: Patient Education method: Explanation and Verbal cues; handout; demonstrated Education comprehension: verbalized understanding; demonstrated understanding  Patient home exercise program: Horizontal and vertical VOR x1 in standing, semitandem and feet together progressions standing on a pillow with horizontal, vertical and diagonal head turns, semitandem and feet together progressions standing on a pillow with body twist at waist, slow marching with 5-second holds standing on pillow with horizontal head turns, ambulation with random head turns in hallway; Added VOR X 2 exercise with handout provided Cone tapping using canned goods standing at kitchen counter in series of 2.   Access Code:  [email or web address] URL: [email or web address] Date: 09/22/2021 Prepared by: Lady Deutscher  Exercises - Feet together with horizontal head turns  - 1 x daily - 7 x weekly - 1 sets - 5 reps - 1 minute hold - Feet together with vertical head turns   - 1 x daily - 7 x weekly - 1 sets - 5 reps - 1 minute hold - Tandem stance with horizontal head turns  - 1 x daily - 7 x weekly - 1 sets - 5 reps - 1 minute hold - Tandem Stance with Head Nods   - 1 x daily - 7 x weekly - 1 sets - 5 reps - 1 minute hold - Standing VOR X1  - 3 x daily - 7 x weekly - 1 sets - 3 reps - 1 minute hold  Note: Portions of this document were prepared using Dragon voice recognition software and although reviewed may contain unintentional dictation errors in syntax, grammar, or spelling.  GOALS: Goals reviewed with patient? Yes  SHORT TERM GOALS: Target date: 11/24/2021  Pt will be independent with HEP in order to improve balance and decrease dizziness symptoms in order to decrease fall risk and improve function at home and in the community. Baseline:  Goal status: MET   LONG TERM GOALS: Target date: 12/22/2021  Patient will have improved FOTO score of 6 points or greater in order to demonstrate improvements in patient's ADLs and functional performance.  Baseline: 45/100 on 08/25/2021; scored 56/100 on 10/27/21 Goal status: Met  2.  Patient will demonstrate reduced falls risk as evidenced by Dynamic Gait Index (DGI) 21/24 or greater. Baseline: 20/24; scored 23/24 Goal status: Met  3.  Patient will reduce falls risk as indicated by Activities Specific Balance Confidence Scale (ABC) >67%. Baseline: 46% on 08/25/2021; scored 73% on 10/27/21 Goal status: Met  4.  Patient will report  50% or greater improvement in her symptoms of dizziness and imbalance with provoking motions in order to better be able to return to her prior activities.  Baseline: currently pt states she is not driving, has difficulty with stairs and  walking on sand at the beach; rated 85-90% on 10/27/21 she is able to walk on the sand, has started limited driving not long distances 30 miles or more Goal status: Met  5. Patient will reduce falls risk as indicated by Activities Specific Balance Confidence Scale (ABC) >80%.  Baseline: 46% on 08/25/2021; scored 73% on 10/27/21  Goal status: New  6. Patient will have improved FOTO score of 60/100 points or greater in order to demonstrate improvements in patient's ADLs and functional performance.  Baseline: 45/100 on 08/25/2021; scored 56/100 on 10/27/21 Goal status: New  Pt states she can do most things, cooking, household activities. Last 3 weeks confidence husband left for 2 days and two weeks ago he went to the beach; she stayed by herself, drove to her MD appt, and she felt confident in being able to stay home and take care of the 3 dogs and the house.  ASSESSMENT:  CLINICAL IMPRESSION: Repeated functional outcome testing this date. Pt reports 85-90% improvement in her symptoms of dizziness and imbalance overall. Pt has met 1/1 short term and 4/4 long term goals as set on plan of care. Patient reports that she can do most things now including cooking and household activities. Pt states she has started driving short distances. Pt states that she would like to further work on trying to decrease her dizziness and to work on her weight shifting and single leg balance as she feels that although her symptoms are decreased, she is not doing as well as she was at discharge from PT services last October 2022. Added step tapping on canned goods in series of 2 for home exercise program this date. Therefore, added additional goals this date and will plan on continuing PT services with emphasis on balance and vestibular exercises.     OBJECTIVE IMPAIRMENTS decreased balance, difficulty walking, decreased strength, and dizziness.   ACTIVITY LIMITATIONS community activity, driving, and walking on the sand, surf  fishing .   PERSONAL FACTORS Past/current experiences, Time since onset of injury/illness/exacerbation, and 3+ comorbidities: migraines, DM, HTN, hyperlipidemia  are also affecting patient's functional outcome.   REHAB POTENTIAL: Good  CLINICAL DECISION MAKING: Evolving/moderate complexity  EVALUATION COMPLEXITY: Moderate   PLAN: PT FREQUENCY: 1-2x/week  PT DURATION: 8 weeks  PLANNED INTERVENTIONS: Therapeutic exercises, Therapeutic activity, Neuromuscular re-education, Balance training, Gait training, Patient/Family education, Vestibular training, and Canal[place] repositioning  PLAN FOR NEXT SESSION:  Plan to work on cone tapping progressions-Airex pad, series of 3 and weight shifting activities. Activties that promote left/right weight shifting combined with single leg stance.      Lady Deutscher PT, DPT [phone] Lady Deutscher, PT 10/27/2021, 7:09 PM

## 2021-11-02 ENCOUNTER — Other Ambulatory Visit: Payer: Self-pay

## 2021-11-02 ENCOUNTER — Emergency Department: Payer: Medicare Other

## 2021-11-02 ENCOUNTER — Observation Stay
Admission: EM | Admit: 2021-11-02 | Discharge: 2021-11-03 | Disposition: A | Discharge status: Home / Self Care | Payer: Medicare Other | Attending: Internal Medicine | Admitting: Internal Medicine

## 2021-11-02 DIAGNOSIS — I10 Essential (primary) hypertension: Secondary | ICD-10-CM | POA: Diagnosis not present

## 2021-11-02 DIAGNOSIS — Z7901 Long term (current) use of anticoagulants: Secondary | ICD-10-CM | POA: Diagnosis not present

## 2021-11-02 DIAGNOSIS — I959 Hypotension, unspecified: Secondary | ICD-10-CM | POA: Diagnosis not present

## 2021-11-02 DIAGNOSIS — J101 Influenza due to other identified influenza virus with other respiratory manifestations: Principal | ICD-10-CM | POA: Insufficient documentation

## 2021-11-02 DIAGNOSIS — Z20822 Contact with and (suspected) exposure to covid-19: Secondary | ICD-10-CM | POA: Diagnosis not present

## 2021-11-02 DIAGNOSIS — E861 Hypovolemia: Secondary | ICD-10-CM | POA: Diagnosis present

## 2021-11-02 DIAGNOSIS — E119 Type 2 diabetes mellitus without complications: Secondary | ICD-10-CM | POA: Insufficient documentation

## 2021-11-02 DIAGNOSIS — E876 Hypokalemia: Secondary | ICD-10-CM | POA: Insufficient documentation

## 2021-11-02 DIAGNOSIS — Z79899 Other long term (current) drug therapy: Secondary | ICD-10-CM | POA: Insufficient documentation

## 2021-11-02 DIAGNOSIS — N179 Acute kidney failure, unspecified: Secondary | ICD-10-CM | POA: Diagnosis not present

## 2021-11-02 DIAGNOSIS — Z87891 Personal history of nicotine dependence: Secondary | ICD-10-CM | POA: Diagnosis not present

## 2021-11-02 DIAGNOSIS — Z794 Long term (current) use of insulin: Secondary | ICD-10-CM | POA: Diagnosis not present

## 2021-11-02 DIAGNOSIS — R55 Syncope and collapse: Secondary | ICD-10-CM | POA: Diagnosis not present

## 2021-11-02 DIAGNOSIS — Z86718 Personal history of other venous thrombosis and embolism: Secondary | ICD-10-CM | POA: Insufficient documentation

## 2021-11-02 DIAGNOSIS — R0602 Shortness of breath: Secondary | ICD-10-CM | POA: Diagnosis present

## 2021-11-02 LAB — BASIC METABOLIC PANEL
Anion gap: 11 (ref 5–15)
BUN: 14 mg/dL (ref 8–23)
CO2: 28 mmol/L (ref 22–32)
Calcium: 8.5 mg/dL — ABNORMAL LOW (ref 8.9–10.3)
Chloride: 98 mmol/L (ref 98–111)
Creatinine, Ser: 1.3 mg/dL — ABNORMAL HIGH (ref 0.44–1.00)
GFR, Estimated: 44 mL/min — ABNORMAL LOW (ref >60)
Glucose, Bld: 148 mg/dL — ABNORMAL HIGH (ref 70–99)
Potassium: 3 mmol/L — ABNORMAL LOW (ref 3.5–5.1)
Sodium: 137 mmol/L (ref 135–145)

## 2021-11-02 LAB — URINALYSIS, COMPLETE (UACMP) WITH MICROSCOPIC
Bilirubin Urine: NEGATIVE
Glucose, UA: NEGATIVE mg/dL
Hgb urine dipstick: NEGATIVE
Ketones, ur: NEGATIVE mg/dL
Nitrite: NEGATIVE
Protein, ur: NEGATIVE mg/dL
Specific Gravity, Urine: 1.017 (ref 1.005–1.030)
pH: 6 (ref 5.0–8.0)

## 2021-11-02 LAB — TROPONIN I (HIGH SENSITIVITY)
Troponin I (High Sensitivity): 9 ng/L (ref <18)
Troponin I (High Sensitivity): 8 ng/L (ref <18)
Troponin I (High Sensitivity): 8 ng/L (ref <18)

## 2021-11-02 LAB — RESP PANEL BY RT-PCR (FLU A&B, COVID) ARPGX2
Influenza A by PCR: POSITIVE — AB
Influenza B by PCR: NEGATIVE
SARS Coronavirus 2 by RT PCR: NEGATIVE

## 2021-11-02 LAB — CBC
HCT: 41.3 % (ref 36.0–46.0)
Hemoglobin: 12.5 g/dL (ref 12.0–15.0)
MCH: 24.2 pg — ABNORMAL LOW (ref 26.0–34.0)
MCHC: 30.3 g/dL (ref 30.0–36.0)
MCV: 80 fL (ref 80.0–100.0)
Platelets: 334 10*3/uL (ref 150–400)
RBC: 5.16 MIL/uL — ABNORMAL HIGH (ref 3.87–5.11)
RDW: 19.5 % — ABNORMAL HIGH (ref 11.5–15.5)
WBC: 6.2 10*3/uL (ref 4.0–10.5)
nRBC: 0 % (ref 0.0–0.2)

## 2021-11-02 LAB — PROCALCITONIN: Procalcitonin: <0.1 ng/mL

## 2021-11-02 LAB — GLUCOSE, CAPILLARY
Glucose-Capillary: 66 mg/dL — ABNORMAL LOW (ref 70–99)
Glucose-Capillary: 45 mg/dL — ABNORMAL LOW (ref 70–99)
Glucose-Capillary: 59 mg/dL — ABNORMAL LOW (ref 70–99)
Glucose-Capillary: 110 mg/dL — ABNORMAL HIGH (ref 70–99)
Glucose-Capillary: 165 mg/dL — ABNORMAL HIGH (ref 70–99)

## 2021-11-02 LAB — LACTIC ACID, PLASMA
Lactic Acid, Venous: 1.1 mmol/L (ref 0.5–1.9)
Lactic Acid, Venous: 1.7 mmol/L (ref 0.5–1.9)

## 2021-11-02 LAB — SEDIMENTATION RATE: Sed Rate: 60 mm/hr — ABNORMAL HIGH (ref 0–30)

## 2021-11-02 LAB — CBG MONITORING, ED: Glucose-Capillary: 127 mg/dL — ABNORMAL HIGH (ref 70–99)

## 2021-11-02 LAB — HIV ANTIBODY (ROUTINE TESTING W REFLEX): HIV Screen 4th Generation wRfx: NONREACTIVE

## 2021-11-02 LAB — MAGNESIUM: Magnesium: 2.1 mg/dL (ref 1.7–2.4)

## 2021-11-02 LAB — CORTISOL: Cortisol, Plasma: 1.4 ug/dL

## 2021-11-02 LAB — TSH: TSH: 0.814 u[IU]/mL (ref 0.350–4.500)

## 2021-11-02 LAB — C-REACTIVE PROTEIN: CRP: 0.6 mg/dL (ref <1.0)

## 2021-11-02 MED ORDER — IOHEXOL 350 MG/ML SOLN
75.0000 mL | Freq: Once | INTRAVENOUS | Status: AC | PRN
  Administered 2021-11-02: 75 mL via INTRAVENOUS

## 2021-11-02 MED ORDER — INSULIN ASPART 100 UNIT/ML IJ SOLN
0.0000 [IU] | Freq: Three times a day (TID) | INTRAMUSCULAR | Status: DC
  Administered 2021-11-03: 2 [IU] via SUBCUTANEOUS
  Filled 2021-11-02: qty 1

## 2021-11-02 MED ORDER — OSELTAMIVIR PHOSPHATE 75 MG PO CAPS
75.0000 mg | ORAL_CAPSULE | Freq: Once | ORAL | Status: AC
  Administered 2021-11-02: 75 mg via ORAL
  Filled 2021-11-02 (×2): qty 1

## 2021-11-02 MED ORDER — SODIUM CHLORIDE 0.9 % IV SOLN: INTRAVENOUS | Status: AC

## 2021-11-02 MED ORDER — ONDANSETRON HCL 4 MG/2ML IJ SOLN: 4.0000 mg | Freq: Four times a day (QID) | INTRAMUSCULAR | Status: DC | PRN

## 2021-11-02 MED ORDER — ACETAMINOPHEN 325 MG PO TABS
650.0000 mg | ORAL_TABLET | Freq: Four times a day (QID) | ORAL | Status: DC | PRN
  Administered 2021-11-02: 650 mg via ORAL
  Filled 2021-11-02: qty 2

## 2021-11-02 MED ORDER — ACETAMINOPHEN 650 MG RE SUPP: 650.0000 mg | Freq: Four times a day (QID) | RECTAL | Status: DC | PRN

## 2021-11-02 MED ORDER — GABAPENTIN 300 MG PO CAPS
600.0000 mg | ORAL_CAPSULE | Freq: Every day | ORAL | Status: DC
  Administered 2021-11-02: 600 mg via ORAL
  Filled 2021-11-02: qty 2

## 2021-11-02 MED ORDER — OSELTAMIVIR PHOSPHATE 30 MG PO CAPS
30.0000 mg | ORAL_CAPSULE | Freq: Two times a day (BID) | ORAL | Status: DC
Start: 2021-11-02 — End: 2021-11-03
  Administered 2021-11-02 – 2021-11-03 (×2): 30 mg via ORAL
  Filled 2021-11-02 (×2): qty 1

## 2021-11-02 MED ORDER — DIPHENHYDRAMINE HCL 50 MG/ML IJ SOLN
25.0000 mg | Freq: Once | INTRAMUSCULAR | Status: AC
  Administered 2021-11-02: 25 mg via INTRAVENOUS
  Filled 2021-11-02: qty 1

## 2021-11-02 MED ORDER — ONDANSETRON HCL 4 MG PO TABS: 4.0000 mg | ORAL_TABLET | Freq: Four times a day (QID) | ORAL | Status: DC | PRN

## 2021-11-02 MED ORDER — ALBUTEROL SULFATE (2.5 MG/3ML) 0.083% IN NEBU: 2.5000 mg | INHALATION_SOLUTION | RESPIRATORY_TRACT | Status: DC | PRN

## 2021-11-02 MED ORDER — METOCLOPRAMIDE HCL 5 MG/ML IJ SOLN
10.0000 mg | Freq: Once | INTRAMUSCULAR | Status: AC
  Administered 2021-11-02: 10 mg via INTRAVENOUS
  Filled 2021-11-02: qty 2

## 2021-11-02 MED ORDER — POTASSIUM CHLORIDE 10 MEQ/100ML IV SOLN
10.0000 meq | INTRAVENOUS | Status: AC
  Administered 2021-11-02 (×4): 10 meq via INTRAVENOUS
  Filled 2021-11-02 (×4): qty 100

## 2021-11-02 MED ORDER — APIXABAN 2.5 MG PO TABS
2.5000 mg | ORAL_TABLET | Freq: Two times a day (BID) | ORAL | Status: DC
  Administered 2021-11-02 – 2021-11-03 (×2): 2.5 mg via ORAL
  Filled 2021-11-02 (×2): qty 1

## 2021-11-02 MED ORDER — OSELTAMIVIR PHOSPHATE 75 MG PO CAPS
75.0000 mg | ORAL_CAPSULE | Freq: Two times a day (BID) | ORAL | Status: DC
Start: 2021-11-02 — End: 2021-11-02

## 2021-11-02 NOTE — Progress Notes (Signed)
1625: bs 66, no signs or symptoms present. Encouraged patient to drink apple juice.  1701: bs 59. Encouraged more apple juice.  1717: bs 17. Apple juice and graham crackers given to patient. Recheck in 15 min.  1732: bs 110. Patient feels like she has a headache. Will proceed with tylenol. No further needs.

## 2021-11-02 NOTE — H&P (Signed)
History and Physical    Debra Leach QAS:[id number] DOB: 01-06-1951 DOA: 11/02/2021  PCP: [name], MD  Patient coming from: home  I have personally briefly reviewed patient's old medical records in [place]  Chief Complaint: sob/cough x 3 days, syncope x 2 day of presentation  HPI: Debra Leach is a 71 y.o. 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 Per patient she passed out w/o any warning at home. She notes she standing in her bathroom when first episode occurred. She states she was found by her husband on the floor. She states she had no injuries. She notes episode was sort lived w/o any injuries.She notes no chest pain, urinary or bladder incontinence with this episode. Per notes in ED patient also had episode of syncope while in triage, which was also short lived as well. At that time patient was noted to have low BP in 79G systolic. Patient notes that over the last 3-4 days she has not eaten well but has kept up with fluids. She notes she has had frequent cough w/o any production of sputum. She denies fever/chills/n/v/d abdominal pain or diarrhea with these symptoms. Patient notes she has not has syncope in the past but for the last several months  she has had mild light headness but never episode of syncope. Patient also noted associated history of weakness in legs in the evening which is resolved by the morning.  She also notes some visual disturbance which she associated with her history of ocular migraine. She has follow scheduled with her pcp and vascular for these complaints.  Currently she notes no sob, chest pain , she did have nausea in triage but this has resolved s/p treatment. She notes she feels much improved  currently.   ED Course:  Initial vitals:  Temp 99.5, bp 98/56, hr 96, rr 17 , sat 95% o nra   Labs:  Wbc: 6.2, hgb 12.5, plt 334 NA 137, K 3, gluc148, cr 1.3 prior 0.99 Lactic 1.1 Resp panel + flu A EKG:nsr at 100, no st -twave changes CT head, CTPA: negative  Lower ext U/S:negative Tx reglan, benadryl ,  Review of Systems: As per HPI otherwise 10 point review of systems negative.   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HCC)    Clotting disorder (HCC)    Complication of anesthesia    difficult waking   Diabetes mellitus without complication (HCC)    Diet and exercise controlled   GERD (gastroesophageal reflux disease)    Headache    History of kidney stones    Hyperlipidemia    Hypertension    MVP (mitral valve prolapse)    history of   PONV (postoperative nausea and vomiting)     Past Surgical History:  Procedure Laterality Date   arm surgery     BREAST BIOPSY Left    BREAST SURGERY  2000   breast biopsy (benign)   LITHOTRIPSY  08-12-2008   stent placed bilaterally   PARTIAL HYSTERECTOMY     Both ovaries remain, vaginal, for [place]     reports that  she has quit smoking. Her smoking use included cigarettes. She has never used smokeless tobacco. She reports that she does not drink alcohol and does not use drugs.  Allergies  Allergen Reactions   Atenolol    Codeine     REACTION: Migraine   Ibuprofen    Jardiance [Empagliflozin]     Caused uti   Penicillins Itching    Has patient had a PCN reaction causing immediate rash, facial/tongue/throat swelling, SOB or lightheadedness with hypotension: No Has patient had a PCN reaction causing severe rash involving mucus membranes or skin necrosis: No Has patient had a PCN reaction that required hospitalization: No Has patient had a PCN reaction occurring within the last  10 years: No If all of the above answers are "NO", then may proceed with Cephalosporin use.    Pentazocine Lactate     REACTION: Swelling, itching, rash   Propranolol Other (See Comments)   Talwin [Pentazocine] Other (See Comments)    Swelling and itching    Crestor [Rosuvastatin] Other (See Comments)   Sudafed [Pseudoephedrine Hcl] Itching and Anxiety    Family History  Problem Relation Age of Onset   Cancer Mother        bone   Hypertension Father    Mitral valve prolapse Father    Asthma Brother    Arthritis Brother    Nephrolithiasis Brother    Nephrolithiasis Brother    Asthma Brother    Arthritis Brother    Aortic aneurysm Brother        ascending aortic aneuysm   Breast cancer Maternal Aunt    Breast cancer Maternal Aunt     Prior to Admission medications   Medication Sig Start Date End Date Taking? Authorizing Provider  atorvastatin (LIPITOR) 80 MG tablet TAKE 1 TABLET BY MOUTH DAILY 10/06/21   Bedsole, Amy E, MD  Blood Glucose Monitoring Suppl (ONETOUCH VERIO REFLECT) w/Device KIT 2 (two) times daily. for testing 10/25/19   [provider]  budesonide (ENTOCORT EC) 3 MG 24 hr capsule Take 2 capsules by mouth daily. 05/03/20   [provider]  Cholecalciferol (VITAMIN D3) 1.25 MG (50000 UT) CAPS Take 1 capsule by mouth once a week. 08/20/21   Bedsole, Amy E, MD  colesevelam (WELCHOL) 625 MG tablet TAKE 3 TABLETS BY MOUTH 2 TIMES DAILY WITH A MEAL. 08/31/21   Bedsole, Amy E, MD  ELIQUIS 2.5 MG TABS tablet TAKE 1 TABLET BY MOUTH 2 TIMES DAILY. 10/06/21   Bedsole, Amy E, MD  Eptinezumab-jjmr (VYEPTI) 100 MG/ML injection Inject 300 mg into the vein.    [provider]  gabapentin (NEURONTIN) 100 MG capsule TAKE 1 CAPSULE BY MOUTH UP TO 3 TIMES DAILY AS NEEDED. 10/07/21   Tomi Likens, Adam R, DO  gabapentin (NEURONTIN) 300 MG capsule TAKE 2 CAPSULES BY MOUTH AT BEDTIME. 10/07/21   Jaffe, Adam R, DO  glucose blood (ONETOUCH VERIO) test strip USE TO CHECK BLOOD  SUGAR TWO TIMES A DAY 05/08/21   Bedsole, Amy E, MD  insulin glargine (LANTUS SOLOSTAR) 100 UNIT/ML Solostar Pen Inject 40 Units into the skin daily. 10/07/21   Shamleffer, Melanie Crazier, MD  Insulin Pen Needle (ULTICARE SHORT PEN NEEDLES) 31G X 8 MM MISC USE TO INJECT INSULIN DAILY 02/19/21   Bedsole, Amy E, MD  Lancets (ONETOUCH DELICA PLUS [id number]) MISC USE TO CHECK BLOOD SUGAR TWO TIMES DAILY 05/08/21   Bedsole, Amy E, MD  losartan-hydrochlorothiazide (HYZAAR) 50-12.5 MG tablet Take 1 tablet by mouth daily. 10/24/20  Bedsole, Amy E, MD  omeprazole (PRILOSEC) 20 MG capsule Take 20 mg by mouth 2 (two) times daily before a meal.    [provider]  ondansetron (ZOFRAN) 4 MG tablet Take 1 tablet (4 mg total) by mouth every 8 (eight) hours as needed for nausea or vomiting. 09/17/21   Bedsole, Amy E, MD  potassium chloride SA (KLOR-CON M) 20 MEQ tablet TAKE 1 TABLET BY MOUTH DAILY. 10/06/21   Bedsole, Amy E, MD  repaglinide (PRANDIN) 0.5 MG tablet Take 1 tablet (0.5 mg total) by mouth 2 (two) times daily. 10/07/21   Shamleffer, Melanie Crazier, MD  UBRELVY 100 MG TABS TAKE 1 TABLET BY MOUTH AS NEEDED (MAY REPEAT DOSE AFTER 2 HOURS IF NEEDED. MAXIMUM 2 TABLETS IN 24 HOURS). 03/27/20   Pieter Partridge, DO  vitamin B-12 (CYANOCOBALAMIN) 1000 MCG tablet Take 1 tablet by mouth daily. 08/15/20   [provider]    Physical Exam: Vitals:   11/02/21 0924 11/02/21 1000 11/02/21 1030 11/02/21 1145  BP: (!) 98/56 (!) 110/47 (!) 109/53 (!) 121/54  Pulse: 96 85 79 83  Resp: _0 Temp: 99.5 F (37.5 C)   99.6 F (37.6 C)  TempSrc: Oral   Oral  SpO2: 95% 96% 96% 98%  Weight:      Height:         Vitals:   11/02/21 0924 11/02/21 1000 11/02/21 1030 11/02/21 1145  BP: (!) 98/56 (!) 110/47 (!) 109/53 (!) 121/54  Pulse: 96 85 79 83  Resp: _1 Temp: 99.5 F (37.5 C)   99.6 F (37.6 C)  TempSrc: Oral   Oral  SpO2: 95% 96% 96% 98%  Weight:      Height:      Constitutional:  NAD, calm, comfortable Eyes: PERRL, lids and conjunctivae normal ENMT: Mucous membranes are moist. Posterior pharynx clear of any exudate or lesions.Normal dentition.  Neck: normal, supple, no masses, no thyromegaly Respiratory: clear to auscultation bilaterally, no wheezing, no crackles. Normal respiratory effort. No accessory muscle use.  Cardiovascular: Regular rate and rhythm, no murmurs / rubs / gallops. No extremity edema. 2+ pedal pulses. Abdomen: no tenderness, no masses palpated. No hepatosplenomegaly. Bowel sounds positive.  Musculoskeletal: no clubbing / cyanosis. No joint deformity upper and lower extremities. Good ROM, no contractures. Normal muscle tone.  Skin: no rashes, lesions, ulcers. No induration Neurologic: CN 2-12 grossly intact. Sensation intact, . Strength 5/5 in all 4.  Psychiatric: Normal judgment and insight. Alert and oriented x 3. Normal mood.    Labs on Admission: I have personally reviewed following labs and imaging studies  CBC: Recent Labs  Lab 11/02/21 0925  WBC 6.2  HGB 12.5  HCT 41.3  MCV 80.0  PLT 784   Basic Metabolic Panel: Recent Labs  Lab 11/02/21 0925  NA 137  K 3.0*  CL 98  CO2 28  GLUCOSE 148*  BUN 14  CREATININE 1.30*  CALCIUM 8.5*   GFR: Estimated Creatinine Clearance: 34.8 mL/min (A) (by C-G formula based on SCr of 1.3 mg/dL (H)). Liver Function Tests: No results for input(s): AST, ALT, ALKPHOS, BILITOT, PROT, ALBUMIN in the last 168 hours. No results for input(s): LIPASE, AMYLASE in the last 168 hours. No results for input(s): AMMONIA in the last 168 hours. Coagulation Profile: No results for input(s): INR, PROTIME in the last 168 hours. Cardiac Enzymes: No results for input(s): CKTOTAL, CKMB, CKMBINDEX, TROPONINI in the last 168 hours. BNP (last 3  results) No results for input(s): PROBNP in the last 8760 hours. HbA1C: No results for input(s): HGBA1C in the last 72 hours. CBG: Recent Labs  Lab 11/02/21 0937   GLUCAP 127*   Lipid Profile: No results for input(s): CHOL, HDL, LDLCALC, TRIG, CHOLHDL, LDLDIRECT in the last 72 hours. Thyroid Function Tests: No results for input(s): TSH, T4TOTAL, FREET4, T3FREE, THYROIDAB in the last 72 hours. Anemia Panel: No results for input(s): VITAMINB12, FOLATE, FERRITIN, TIBC, IRON, RETICCTPCT in the last 72 hours. Urine analysis:    Component Value Date/Time   COLORURINE YELLOW 03/01/2019 1820   APPEARANCEUR HAZY (A) 03/01/2019 1820   LABSPEC 1.005 03/01/2019 1820   PHURINE 5.0 03/01/2019 1820   GLUCOSEU NEGATIVE 03/01/2019 1820   HGBUR MODERATE (A) 03/01/2019 1820   BILIRUBINUR neg 08/06/2021 1612   KETONESUR negative 07/28/2021 1333   KETONESUR NEGATIVE 03/01/2019 1820   PROTEINUR Negative 08/06/2021 1612   PROTEINUR NEGATIVE 03/01/2019 1820   UROBILINOGEN 0.2 08/06/2021 1612   NITRITE neg 08/06/2021 1612   NITRITE NEGATIVE 03/01/2019 1820   LEUKOCYTESUR Negative 08/06/2021 1612   LEUKOCYTESUR SMALL (A) 03/01/2019 1820    Radiological Exams on Admission: CT Head Wo Contrast  Result Date: 11/02/2021 CLINICAL DATA:  Syncopal episode in bathroom this morning. EXAM: CT HEAD WITHOUT CONTRAST TECHNIQUE: Contiguous axial images were obtained from the base of the skull through the vertex without intravenous contrast. RADIATION DOSE REDUCTION: This exam was performed according to the departmental dose-optimization program which includes automated exposure control, adjustment of the mA and/or kV according to patient size and/or use of iterative reconstruction technique. COMPARISON:  MRI on 12/23/2020 FINDINGS: Brain: No evidence of intracranial hemorrhage, acute infarction, hydrocephalus, extra-axial collection, or mass lesion/mass effect. Vascular:  No hyperdense vessel or other acute findings. Skull: No evidence of fracture or other significant bone abnormality. Sinuses/Orbits:  No acute findings. Other: None. IMPRESSION: Negative noncontrast head CT.  Electronically Signed   By: Marlaine Hind M.D.   On: 11/02/2021 11:16   CT Angio Chest PE W and/or Wo Contrast  Result Date: 11/02/2021 CLINICAL DATA:  Cough and syncopal episode today. Lower extremity DVT. High clinical suspicion for pulmonary embolism. EXAM: CT ANGIOGRAPHY CHEST WITH CONTRAST TECHNIQUE: Multidetector CT imaging of the chest was performed using the standard protocol during bolus administration of intravenous contrast. Multiplanar CT image reconstructions and MIPs were obtained to evaluate the vascular anatomy. RADIATION DOSE REDUCTION: This exam was performed according to the departmental dose-optimization program which includes automated exposure control, adjustment of the mA and/or kV according to patient size and/or use of iterative reconstruction technique. CONTRAST:  62m OMNIPAQUE IOHEXOL 350 MG/ML SOLN COMPARISON:  12/19/2019 FINDINGS: Cardiovascular: Satisfactory opacification of pulmonary arteries noted, and no pulmonary emboli identified. No evidence of thoracic aortic dissection or aneurysm. Aortic and coronary atherosclerotic calcification incidentally noted. Mediastinum/Nodes: Multiple small less than 1 cm low-attenuation thyroid nodules are seen, decreased since prior study (no followup imaging recommended). Stable mild bilateral hilar lymphadenopathy. Stable shotty less than 1 cm mediastinal lymph nodes. Stable elevation of right hemidiaphragm. Lungs/Pleura: Stable mild right basilar atelectasis versus scarring. No evidence of mass or consolidation. Upper abdomen: Stable small hiatal hernia. Musculoskeletal: No suspicious bone lesions identified. Review of the MIP images confirms the above findings. IMPRESSION: No evidence of pulmonary embolism or other acute findings. Stable mild bilateral hilar lymphadenopathy. Stable small hiatal hernia. Aortic Atherosclerosis (ICD10-I70.0). Electronically Signed   By: JMarlaine HindM.D.   On: 11/02/2021 11:26   UK[place]Venous Img Lower Unilateral  Right  Result Date: 11/02/2021 CLINICAL DATA:  Worsening right leg swelling. History of previous DVT. EXAM: 04/07/2020. LOWER EXTREMITY VENOUS DOPPLER ULTRASOUND TECHNIQUE: Gray-scale sonography with compression, as well as color and duplex ultrasound, were performed to evaluate the deep venous system(s) from the level of the common femoral vein through the popliteal and proximal calf veins. COMPARISON:  None Available. FINDINGS: VENOUS Normal compressibility of the common femoral, superficial femoral, and popliteal veins, as well as the visualized calf veins. Visualized portions of profunda femoral vein and great saphenous vein unremarkable. No filling defects to suggest DVT on grayscale or color Doppler imaging. Doppler waveforms show normal direction of venous flow, normal respiratory plasticity and response to augmentation. Limited views of the contralateral common femoral vein are unremarkable. OTHER None. Limitations: none IMPRESSION: Negative. Electronically Signed   By: Claudie Revering M.D.   On: 11/02/2021 11:30    EKG: Independently reviewed. See above   Assessment/Plan  Syncope  -ct head negative -CTPA negative  -+ FLU A -non focal neuro exam  -in setting of Flu A, presumed due to orthostasis  -orthostatic vitals  -supportive care with ivfs  -to be complete monitor on tele , echo  -monitor on neuro checks    Influenza A -supportive care  - start tamiflu 75 mg bid  x 5 days   Mild AKI/dehydration  -continue with ivfs -hold nephrotoxic medications   Hx of PE/DVT -conitnue on Eliquis   Hx of HTN  -hold ARB/HCTZ  -resume bp medications as BP tolerates    HLD -continue statin   DMII -resume  lantus  -place on iss /fs   Hx of Auto immune pancreatitis -no active issues currently -continue chronic steroids   Hypokalemia -replete prn   DVT prophylaxis: on full dose anti coagulation Code Status: full Family Communication: bn/a Disposition Plan: patient  expected to  be admitted less than 2 midnights  Consults called: n/a Admission status: observation  Clance Boll MD [facility]   If 7PM-7AM, please contact night-coverage [email or web address] Password [email or web address]  11/02/2021, 1:16 PM

## 2021-11-02 NOTE — ED Notes (Signed)
ED Provider at bedside. 

## 2021-11-02 NOTE — ED Notes (Signed)
Pt had another syncopal episode while sitting in triage,lasting for approximately 30secs. Pt is a/ox4 at present. Denies hx of syncope

## 2021-11-02 NOTE — ED Triage Notes (Signed)
Pt c/o pain and swelling to the RLE for the past 3 days with a hx of DVT, pt c/o cough, congestion for the past 3 days, and states she passed out this morning states she was standing up in the bathroom when it happened. Denies any injuries. States she checked her glucose and it was 86 and then took her insulin and only has a piece of toast

## 2021-11-02 NOTE — ED Provider Notes (Addendum)
[facility] Provider Note    Event Date/Time   First MD Initiated Contact with Patient 11/02/21 [phone]     (approximate)   History   Shortness of Breath and Loss of Consciousness   HPI  Debra Leach is a 71 y.o. female with history of DVT, PE (on Eliquis), diabetes, and hypertension who presents with multiple symptoms over the last few days.  She has had chest congestion, cough, and some shortness of breath but no chest pain.  Today, the patient had an episode of syncope.  She states that she was feeling somewhat lightheaded but had no specific prodrome and suddenly passed out and woke to find her husband standing over her.  She does not believe she hit her head.  She has had a migraine headache over the last few days with a visual disturbance.  The patient states that she has had migraines with visual disturbances before, but this disturbance is a new one for her, consisting of a spot in her central vision with some swelling or movement around it.  This is present in both eyes.  The patient reports chronic right lower extremity swelling and states it is slightly more painful than normal.    Physical Exam   Triage Vital Signs: ED Triage Vitals  Enc Vitals Group     BP 11/02/21 0924 (!) 98/56     Pulse Rate 11/02/21 0924 96     Resp 11/02/21 0924 17     Temp 11/02/21 0924 99.5 F (37.5 C)     Temp Source 11/02/21 0924 Oral     SpO2 11/02/21 0924 95 %     Weight 11/02/21 0923 151 lb (68.5 kg)     Height 11/02/21 0923 5' (1.524 m)     Head Circumference --      Peak Flow --      Pain Score 11/02/21 0941 0     Pain Loc --      Pain Edu? --      Excl. in [place]? --     Most recent vital signs: Vitals:   11/02/21 1030 11/02/21 1145  BP: (!) 109/53 (!) 121/54  Pulse: 79 83  Resp: 19 18  Temp:  99.6 F (37.6 C)  SpO2: 96% 98%    General: Alert and oriented, relatively well-appearing. CV:  Good peripheral perfusion.  Normal heart  sounds. Resp:  Normal effort.  Lungs CTAB. Abd:  No distention.  Other:  Mild right lower extremity swelling compared to left with no pitting edema.  Motor intact in all extremities.  Normal coordination.  No midline cervical spinal tenderness.   ED Results / Procedures / Treatments   Labs (all labs ordered are listed, but only abnormal results are displayed) Labs Reviewed  RESP PANEL BY RT-PCR (FLU A&B, COVID) ARPGX2 - Abnormal; Notable for the following components:      Result Value   Influenza A by PCR POSITIVE (*)    All other components within normal limits  BASIC METABOLIC PANEL - Abnormal; Notable for the following components:   Potassium 3.0 (*)    Glucose, Bld 148 (*)    Creatinine, Ser 1.30 (*)    Calcium 8.5 (*)    GFR, Estimated 44 (*)    All other components within normal limits  CBC - Abnormal; Notable for the following components:   RBC 5.16 (*)    MCH 24.2 (*)    RDW 19.5 (*)    All other components  within normal limits  CBG MONITORING, ED - Abnormal; Notable for the following components:   Glucose-Capillary 127 (*)    All other components within normal limits  LACTIC ACID, PLASMA  URINALYSIS, ROUTINE W REFLEX MICROSCOPIC  LACTIC ACID, PLASMA  HIV ANTIBODY (ROUTINE TESTING W REFLEX)  TSH  URINALYSIS, COMPLETE (UACMP) WITH MICROSCOPIC  MAGNESIUM  CORTISOL  SEDIMENTATION RATE  C-REACTIVE PROTEIN  PROCALCITONIN  CBG MONITORING, ED  TROPONIN I (HIGH SENSITIVITY)  TROPONIN I (HIGH SENSITIVITY)  TROPONIN I (HIGH SENSITIVITY)     EKG  ED ECG REPORT I, Arta Silence, the attending physician, personally viewed and interpreted this ECG.  Date: 11/02/2021 EKG Time: 0926 Rate: 100 Rhythm: normal sinus rhythm QRS Axis: Left axis Intervals: normal ST/T Wave abnormalities: normal Narrative Interpretation: no evidence of acute ischemia    RADIOLOGY  CT head: I independently viewed and interpreted the images; there is no ICH or other acute  abnormality  CT angio chest: No acute PE  US venous RLE: No acute DVT  PROCEDURES:  Critical Care performed: No  Procedures   MEDICATIONS ORDERED IN ED: Medications  insulin aspart (novoLOG) injection 0-9 Units (has no administration in time range)  0.9 %  sodium chloride infusion ( Intravenous New Bag/Given 11/02/21 1557)  acetaminophen (TYLENOL) tablet 650 mg (has no administration in time range)    Or  acetaminophen (TYLENOL) suppository 650 mg (has no administration in time range)  ondansetron (ZOFRAN) tablet 4 mg (has no administration in time range)    Or  ondansetron (ZOFRAN) injection 4 mg (has no administration in time range)  albuterol (PROVENTIL) (2.5 MG/3ML) 0.083% nebulizer solution 2.5 mg (has no administration in time range)  potassium chloride 10 mEq in 100 mL IVPB (10 mEq Intravenous New Bag/Given 11/02/21 1558)  oseltamivir (TAMIFLU) capsule 75 mg (has no administration in time range)    Followed by  oseltamivir (TAMIFLU) capsule 30 mg (has no administration in time range)  metoCLOPramide (REGLAN) injection 10 mg (10 mg Intravenous Given 11/02/21 1050)  diphenhydrAMINE (BENADRYL) injection 25 mg (25 mg Intravenous Given 11/02/21 1051)  iohexol (OMNIPAQUE) 350 MG/ML injection 75 mL (75 mLs Intravenous Contrast Given 11/02/21 1057)     IMPRESSION / MDM / ASSESSMENT AND PLAN / ED COURSE  I reviewed the triage vital signs and the nursing notes.  71 year old female with PMH as noted above presents with cough and chest congestion over the last several days, a new migraine, and an episode of syncope with no significant prodrome.  I reviewed the past medical records.  The patient has no recent admissions to the hospital.  Her most recent outpatient visit was last week at gastroenterology for follow-up of autoimmune pancreatitis.  On exam the patient is overall well-appearing.  Her vital signs are normal except for borderline elevated temperature and heart rate.  Lungs  are clear to auscultation.  There is mild right lower extremity swelling which patient reports is chronic.  She has a nonfocal neurologic exam.  Differential diagnosis includes, but is not limited to, acute bronchitis, pneumonia, COVID-19 or other viral syndrome, other infection, pulmonary embolism, ACS, cardiac arrhythmia.  Patient's presentation is most consistent with acute presentation with potential threat to life or bodily function.  We will obtain CT angio to rule out PE given the patient's elevated risk, ultrasound of the right leg, lab work-up, and reassess.  The patient is on the cardiac monitor to evaluate for evidence of arrhythmia and/or significant heart rate changes.  ----------------------------------------- 1:38 PM on  11/02/2021 -----------------------------------------  CT angio shows no evidence of PE.  The ultrasound is negative.  The patient is positive for influenza A.  Her other lab work-up is unremarkable.  Blood chemistry is normal.  There is no leukocytosis.  I suspect that the syncope was likely related to dehydration/orthostasis in the context of influenza, but given the patient's age and the lack of any prodrome, we will admit for monitoring and work-up.  I consulted Dr. Marcello Moores from the hospitalist service; based on her discussion she agrees to admit the patient.   FINAL CLINICAL IMPRESSION(S) / ED DIAGNOSES   Final diagnoses:  Syncope, unspecified syncope type  Influenza A     Rx / DC Orders   ED Discharge Orders     None        Note:  This document was prepared using Dragon voice recognition software and may include unintentional dictation errors.    Arta Silence, MD 11/02/21 1604    Arta Silence, MD 11/02/21 [phone]

## 2021-11-03 ENCOUNTER — Encounter: Payer: Self-pay | Admitting: Internal Medicine

## 2021-11-03 ENCOUNTER — Observation Stay (HOSPITAL_BASED_OUTPATIENT_CLINIC_OR_DEPARTMENT_OTHER)
Admit: 2021-11-03 | Discharge: 2021-11-03 | Disposition: A | Discharge status: Home / Self Care | Payer: Medicare Other | Attending: Internal Medicine | Admitting: Internal Medicine

## 2021-11-03 ENCOUNTER — Observation Stay: Admit: 2021-11-03 | Payer: Medicare Other

## 2021-11-03 ENCOUNTER — Ambulatory Visit: Payer: Medicare Other | Admitting: Physical Therapy

## 2021-11-03 DIAGNOSIS — I959 Hypotension, unspecified: Secondary | ICD-10-CM | POA: Diagnosis present

## 2021-11-03 DIAGNOSIS — E861 Hypovolemia: Secondary | ICD-10-CM | POA: Diagnosis present

## 2021-11-03 DIAGNOSIS — J101 Influenza due to other identified influenza virus with other respiratory manifestations: Secondary | ICD-10-CM | POA: Diagnosis not present

## 2021-11-03 DIAGNOSIS — E876 Hypokalemia: Secondary | ICD-10-CM | POA: Diagnosis present

## 2021-11-03 DIAGNOSIS — R55 Syncope and collapse: Secondary | ICD-10-CM | POA: Diagnosis not present

## 2021-11-03 DIAGNOSIS — I9589 Other hypotension: Secondary | ICD-10-CM

## 2021-11-03 DIAGNOSIS — N179 Acute kidney failure, unspecified: Secondary | ICD-10-CM | POA: Diagnosis not present

## 2021-11-03 LAB — COMPREHENSIVE METABOLIC PANEL
ALT: 35 U/L (ref 0–44)
AST: 43 U/L — ABNORMAL HIGH (ref 15–41)
Albumin: 2.8 g/dL — ABNORMAL LOW (ref 3.5–5.0)
Alkaline Phosphatase: 53 U/L (ref 38–126)
Anion gap: 9 (ref 5–15)
BUN: 11 mg/dL (ref 8–23)
CO2: 27 mmol/L (ref 22–32)
Calcium: 8.1 mg/dL — ABNORMAL LOW (ref 8.9–10.3)
Chloride: 107 mmol/L (ref 98–111)
Creatinine, Ser: 0.94 mg/dL (ref 0.44–1.00)
GFR, Estimated: >60 mL/min (ref >60)
Glucose, Bld: 101 mg/dL — ABNORMAL HIGH (ref 70–99)
Potassium: 3.9 mmol/L (ref 3.5–5.1)
Sodium: 143 mmol/L (ref 135–145)
Total Bilirubin: 0.5 mg/dL (ref 0.3–1.2)
Total Protein: 6.6 g/dL (ref 6.5–8.1)

## 2021-11-03 LAB — ECHOCARDIOGRAM COMPLETE
Area-P 1/2: 3.61 cm2
Height: 60 in
S' Lateral: 1.9 cm
Weight: 2416 oz

## 2021-11-03 LAB — CBC
HCT: 37.3 % (ref 36.0–46.0)
Hemoglobin: 11.2 g/dL — ABNORMAL LOW (ref 12.0–15.0)
MCH: 23.9 pg — ABNORMAL LOW (ref 26.0–34.0)
MCHC: 30 g/dL (ref 30.0–36.0)
MCV: 79.5 fL — ABNORMAL LOW (ref 80.0–100.0)
Platelets: 287 10*3/uL (ref 150–400)
RBC: 4.69 MIL/uL (ref 3.87–5.11)
RDW: 19.2 % — ABNORMAL HIGH (ref 11.5–15.5)
WBC: 5.3 10*3/uL (ref 4.0–10.5)
nRBC: 0 % (ref 0.0–0.2)

## 2021-11-03 LAB — GLUCOSE, CAPILLARY
Glucose-Capillary: 120 mg/dL — ABNORMAL HIGH (ref 70–99)
Glucose-Capillary: 171 mg/dL — ABNORMAL HIGH (ref 70–99)
Glucose-Capillary: 151 mg/dL — ABNORMAL HIGH (ref 70–99)

## 2021-11-03 MED ORDER — VITAMIN D (ERGOCALCIFEROL) 1.25 MG (50000 UNIT) PO CAPS
50000.0000 [IU] | ORAL_CAPSULE | ORAL | Status: DC
  Administered 2021-11-03: 50000 [IU] via ORAL
  Filled 2021-11-03: qty 1

## 2021-11-03 MED ORDER — VITAMIN B-12 1000 MCG PO TABS
1000.0000 ug | ORAL_TABLET | Freq: Every day | ORAL | Status: DC
  Administered 2021-11-03: 1000 ug via ORAL
  Filled 2021-11-03: qty 1

## 2021-11-03 MED ORDER — ATORVASTATIN CALCIUM 80 MG PO TABS: 80.0000 mg | ORAL_TABLET | Freq: Every day | ORAL | Status: DC

## 2021-11-03 MED ORDER — OSELTAMIVIR PHOSPHATE 30 MG PO CAPS: 30.0000 mg | ORAL_CAPSULE | Freq: Two times a day (BID) | ORAL | 0 refills | Status: AC

## 2021-11-03 MED ORDER — GABAPENTIN 100 MG PO CAPS: 100.0000 mg | ORAL_CAPSULE | Freq: Three times a day (TID) | ORAL | Status: DC

## 2021-11-03 MED ORDER — INSULIN GLARGINE-YFGN 100 UNIT/ML ~~LOC~~ SOLN
20.0000 [IU] | Freq: Every day | SUBCUTANEOUS | Status: DC
  Administered 2021-11-03: 20 [IU] via SUBCUTANEOUS
  Filled 2021-11-03: qty 0.2

## 2021-11-03 MED ORDER — COLESEVELAM HCL 625 MG PO TABS
625.0000 mg | ORAL_TABLET | Freq: Two times a day (BID) | ORAL | Status: DC
  Filled 2021-11-03: qty 1

## 2021-11-03 MED ORDER — POTASSIUM CHLORIDE CRYS ER 20 MEQ PO TBCR
20.0000 meq | EXTENDED_RELEASE_TABLET | Freq: Every day | ORAL | Status: DC
  Filled 2021-11-03: qty 1

## 2021-11-03 MED ORDER — PANTOPRAZOLE SODIUM 40 MG PO TBEC
40.0000 mg | DELAYED_RELEASE_TABLET | Freq: Every day | ORAL | Status: DC
  Administered 2021-11-03: 40 mg via ORAL
  Filled 2021-11-03: qty 1

## 2021-11-03 MED ORDER — ATORVASTATIN CALCIUM 20 MG PO TABS
80.0000 mg | ORAL_TABLET | Freq: Every day | ORAL | Status: DC
Start: 2021-11-03 — End: 2021-11-03

## 2021-11-03 MED ORDER — BUDESONIDE 3 MG PO CPEP
6.0000 mg | ORAL_CAPSULE | Freq: Every day | ORAL | Status: DC
  Administered 2021-11-03: 6 mg via ORAL
  Filled 2021-11-03: qty 2

## 2021-11-03 MED ORDER — UBROGEPANT 100 MG PO TABS: 1.0000 | ORAL_TABLET | ORAL | Status: DC | PRN

## 2021-11-03 NOTE — Progress Notes (Signed)
Pt A/Ox4 upon review of AVS. Tele removed. PIV removed. Husband driving home,. Will escort patient to entrance with wheelchair.

## 2021-11-03 NOTE — Progress Notes (Signed)
Inpatient Diabetes Program Recommendations  AACE/ADA: New Consensus Statement on Inpatient Glycemic Control (2015)  Target Ranges:  Prepandial:   less than 140 mg/dL      Peak postprandial:   less than 180 mg/dL (1-2 hours)      Critically ill patients:  140 - 180 mg/dL   Lab Results  Component Value Date   GLUCAP 171 (H) 11/03/2021   HGBA1C 8.8 (H) 08/17/2021    Review of Glycemic Control  Latest Reference Range & Units 11/03/21 07:35 11/03/21 11:58  Glucose-Capillary 70 - 99 mg/dL 120 (H) 171 (H)   Diabetes history: DM 2 Outpatient Diabetes medications: Lantus 40 units daily, Prandin 0.5 mg bid with meals Current orders for Inpatient glycemic control:  Novolog 0-9 units tid with meals Semglee 20 units daily Inpatient Diabetes Program Recommendations:    Note that blood sugars are within goal today.  Patient did not receive basal insulin yesterday per documentation. Per RN, patient was NPO as well.  MD has ordered 1/2 of patient's home basal insulin today. Will follow.  Thanks, Adah Perl, RN, BC-ADM Inpatient Diabetes Coordinator Pager [phone]  (8a-5p)

## 2021-11-03 NOTE — Progress Notes (Signed)
  Echocardiogram 2D Echocardiogram has been performed.  Debra Leach M 11/03/2021, 9:54 AM

## 2021-11-03 NOTE — Discharge Summary (Signed)
Physician Discharge Summary   Patient: Debra Leach MRN: [id number] DOB: 1950/06/28  Admit date:     11/02/2021  Discharge date: 11/03/21  Discharge Physician: Jennye Boroughs   PCP: [name], MD   Recommendations at discharge:   Follow-up with PCP in 1 week  Discharge Diagnoses: Principal Problem:   Influenza A Active Problems:   Syncope   Hypokalemia   AKI (acute kidney injury) ([place])   Hypotension  Resolved Problems:   * No resolved hospital problems. *  Hospital Course:  Debra Leach is a 71 y.o. 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  Syncopal episode occurred while she was standing in the bathroom.  It was short-lived and there were no injuries reported.  She had another syncopal episode while in the emergency department triage area.  Her blood pressure was low with systolic BP in the 93X.    She was admitted to the hospital for influenza A infection, syncope, hypotension, AKI and dehydration.  She was treated with Tamiflu and IV fluids.  She also had hypokalemia that was repleted.  Her condition has improved significantly.  She was able to ambulate in the hallways without any symptoms.  She is deemed stable for discharge to home today.  Discharge plan was discussed with the patient and her husband at the bedside.        Consultants: None Procedures performed: None Disposition: Home Diet recommendation:  Discharge Diet Orders (From admission, onward)     Start     Ordered   11/03/21 0000  Diet - low sodium heart healthy        11/03/21 1611           Cardiac and Carb modified diet DISCHARGE MEDICATION: Allergies as of 11/03/2021       Reactions   Atenolol    Codeine     REACTION: Migraine   Ibuprofen    Jardiance [empagliflozin]    Caused uti   Penicillins Itching   Has patient had a PCN reaction causing immediate rash, facial/tongue/throat swelling, SOB or lightheadedness with hypotension: No Has patient had a PCN reaction causing severe rash involving mucus membranes or skin necrosis: No Has patient had a PCN reaction that required hospitalization: No Has patient had a PCN reaction occurring within the last 10 years: No If all of the above answers are "NO", then may proceed with Cephalosporin use.   Pentazocine Lactate    REACTION: Swelling, itching, rash   Propranolol Other (See Comments)   Talwin [pentazocine] Other (See Comments)   Swelling and itching    Crestor [rosuvastatin] Other (See Comments)   Sudafed [pseudoephedrine Hcl] Itching, Anxiety        Medication List     TAKE these medications    atorvastatin 80 MG tablet Commonly known as: LIPITOR Take 1 tablet (80 mg total) by mouth at bedtime.   budesonide 3 MG 24 hr capsule Commonly known as: ENTOCORT EC Take 2 capsules by mouth daily.   colesevelam 625 MG tablet Commonly known as: WELCHOL TAKE 3 TABLETS BY MOUTH 2 TIMES DAILY WITH A MEAL.   Eliquis 2.5 MG Tabs tablet Generic drug: apixaban TAKE 1 TABLET BY MOUTH 2 TIMES DAILY.   gabapentin 300 MG capsule Commonly known as: NEURONTIN  TAKE 2 CAPSULES BY MOUTH AT BEDTIME.   gabapentin 100 MG capsule Commonly known as: NEURONTIN TAKE 1 CAPSULE BY MOUTH UP TO 3 TIMES DAILY AS NEEDED.   Lantus SoloStar 100 UNIT/ML Solostar Pen Generic drug: insulin glargine Inject 40 Units into the skin daily.   losartan-hydrochlorothiazide 50-12.5 MG tablet Commonly known as: HYZAAR Take 1 tablet by mouth daily.   omeprazole 20 MG capsule Commonly known as: PRILOSEC Take 20 mg by mouth 2 (two) times daily before a meal.   ondansetron 4 MG tablet Commonly known as: Zofran Take 1 tablet (4 mg total) by mouth every 8 (eight) hours  as needed for nausea or vomiting.   OneTouch Delica Plus [id number] Misc USE TO CHECK BLOOD SUGAR TWO TIMES DAILY   OneTouch Verio Reflect w/Device Kit 2 (two) times daily. for testing   OneTouch Verio test strip Generic drug: glucose blood USE TO CHECK BLOOD SUGAR TWO TIMES A DAY   oseltamivir 30 MG capsule Commonly known as: TAMIFLU Take 1 capsule (30 mg total) by mouth 2 (two) times daily for 8 doses.   potassium chloride SA 20 MEQ tablet Commonly known as: KLOR-CON M TAKE 1 TABLET BY MOUTH DAILY.   repaglinide 0.5 MG tablet Commonly known as: PRANDIN Take 1 tablet (0.5 mg total) by mouth 2 (two) times daily.   Ubrelvy 100 MG Tabs Generic drug: Ubrogepant TAKE 1 TABLET BY MOUTH AS NEEDED (MAY REPEAT DOSE AFTER 2 HOURS IF NEEDED. MAXIMUM 2 TABLETS IN 24 HOURS).   UltiCare Short Pen Needles 31G X 8 MM Misc Generic drug: Insulin Pen Needle USE TO INJECT INSULIN DAILY   vitamin B-12 1000 MCG tablet Commonly known as: CYANOCOBALAMIN Take 1 tablet by mouth daily.   Vitamin D3 1.25 MG (50000 UT) Caps Take 1 capsule by mouth once a week.   Vyepti 100 MG/ML injection Generic drug: Eptinezumab-jjmr Inject 300 mg into the vein.        Discharge Exam: Filed Weights   11/02/21 0923  Weight: 68.5 kg   GEN: NAD SKIN: No rash EYES: EOMI ENT: MMM CV: RRR PULM: CTA B ABD: soft, ND, NT, +BS CNS: AAO x 3, non focal EXT: No edema or tenderness   Condition at discharge: good  The results of significant diagnostics from this hospitalization (including imaging, microbiology, ancillary and laboratory) are listed below for reference.   Imaging Studies: CT Head Wo Contrast  Result Date: 11/02/2021 CLINICAL DATA:  Syncopal episode in bathroom this morning. EXAM: CT HEAD WITHOUT CONTRAST TECHNIQUE: Contiguous axial images were obtained from the base of the skull through the vertex without intravenous contrast. RADIATION DOSE REDUCTION: This exam was performed according to  the departmental dose-optimization program which includes automated exposure control, adjustment of the mA and/or kV according to patient size and/or use of iterative reconstruction technique. COMPARISON:  MRI on 12/23/2020 FINDINGS: Brain: No evidence of intracranial hemorrhage, acute infarction, hydrocephalus, extra-axial collection, or mass lesion/mass effect. Vascular:  No hyperdense vessel or other acute findings. Skull: No evidence of fracture or other significant bone abnormality. Sinuses/Orbits:  No acute findings. Other: None. IMPRESSION: Negative noncontrast head CT. Electronically Signed   By: Marlaine Hind M.D.   On: 11/02/2021 11:16   CT Angio Chest PE W and/or Wo Contrast  Result Date: 11/02/2021 CLINICAL DATA:  Cough and syncopal episode today. Lower extremity DVT. High clinical suspicion for pulmonary embolism. EXAM: CT ANGIOGRAPHY CHEST WITH CONTRAST TECHNIQUE: Multidetector CT imaging of the chest was performed using the standard protocol  during bolus administration of intravenous contrast. Multiplanar CT image reconstructions and MIPs were obtained to evaluate the vascular anatomy. RADIATION DOSE REDUCTION: This exam was performed according to the departmental dose-optimization program which includes automated exposure control, adjustment of the mA and/or kV according to patient size and/or use of iterative reconstruction technique. CONTRAST:  57m OMNIPAQUE IOHEXOL 350 MG/ML SOLN COMPARISON:  12/19/2019 FINDINGS: Cardiovascular: Satisfactory opacification of pulmonary arteries noted, and no pulmonary emboli identified. No evidence of thoracic aortic dissection or aneurysm. Aortic and coronary atherosclerotic calcification incidentally noted. Mediastinum/Nodes: Multiple small less than 1 cm low-attenuation thyroid nodules are seen, decreased since prior study (no followup imaging recommended). Stable mild bilateral hilar lymphadenopathy. Stable shotty less than 1 cm mediastinal lymph nodes.  Stable elevation of right hemidiaphragm. Lungs/Pleura: Stable mild right basilar atelectasis versus scarring. No evidence of mass or consolidation. Upper abdomen: Stable small hiatal hernia. Musculoskeletal: No suspicious bone lesions identified. Review of the MIP images confirms the above findings. IMPRESSION: No evidence of pulmonary embolism or other acute findings. Stable mild bilateral hilar lymphadenopathy. Stable small hiatal hernia. Aortic Atherosclerosis (ICD10-I70.0). Electronically Signed   By: JMarlaine HindM.D.   On: 11/02/2021 11:26   [place]Venous Img Lower Unilateral Right  Result Date: 11/02/2021 CLINICAL DATA:  Worsening right leg swelling. History of previous DVT. EXAM: 04/07/2020. LOWER EXTREMITY VENOUS DOPPLER ULTRASOUND TECHNIQUE: Gray-scale sonography with compression, as well as color and duplex ultrasound, were performed to evaluate the deep venous system(s) from the level of the common femoral vein through the popliteal and proximal calf veins. COMPARISON:  None Available. FINDINGS: VENOUS Normal compressibility of the common femoral, superficial femoral, and popliteal veins, as well as the visualized calf veins. Visualized portions of profunda femoral vein and great saphenous vein unremarkable. No filling defects to suggest DVT on grayscale or color Doppler imaging. Doppler waveforms show normal direction of venous flow, normal respiratory plasticity and response to augmentation. Limited views of the contralateral common femoral vein are unremarkable. OTHER None. Limitations: none IMPRESSION: Negative. Electronically Signed   By: SClaudie ReveringM.D.   On: 11/02/2021 11:30   ECHOCARDIOGRAM COMPLETE  Result Date: 11/03/2021    ECHOCARDIOGRAM REPORT   Patient Name:   Debra MILOSEVICDate of Exam: 11/03/2021 Medical Rec #:  [id number]             Height:       60.0 in Accession #:    [id number]            Weight:       151.0 lb Date of Birth:  105-22-1952            BSA:          1.656  m Patient Age:    89years               BP:           128/59 mmHg Patient Gender: F                      HR:           87 bpm. Exam Location:  Inpatient Procedure: 2D Echo, 3D Echo, Cardiac Doppler and Color Doppler Indications:     Syncope R55  History:         Patient has prior history of Echocardiogram examinations, most                  recent 03/02/2019. Risk  Factors:Hypertension, Diabetes and                  Dyslipidemia. GERD. Flu.  Sonographer:     Darlina Sicilian RDCS Referring Phys:  [id number] SARA-MAIZ A THOMAS Diagnosing Phys: Nelva Bush MD IMPRESSIONS  1. Left ventricular ejection fraction, by estimation, is 60 to 65%. The left ventricle has normal function. The left ventricle has no regional wall motion abnormalities. Left ventricular diastolic parameters are consistent with Grade I diastolic dysfunction (impaired relaxation).  2. Right ventricular systolic function is normal. The right ventricular size is normal. There is normal pulmonary artery systolic pressure.  3. The mitral valve is normal in structure. Trivial mitral valve regurgitation.  4. The aortic valve has an indeterminant number of cusps. There is mild thickening of the aortic valve. Aortic valve regurgitation is not visualized. Aortic valve sclerosis is present, with no evidence of aortic valve stenosis.  5. The inferior vena cava is normal in size with greater than 50% respiratory variability, suggesting right atrial pressure of 3 mmHg. FINDINGS  Left Ventricle: Left ventricular ejection fraction, by estimation, is 60 to 65%. The left ventricle has normal function. The left ventricle has no regional wall motion abnormalities. The left ventricular internal cavity size was normal in size. There is  borderline left ventricular hypertrophy. Left ventricular diastolic parameters are consistent with Grade I diastolic dysfunction (impaired relaxation). Right Ventricle: The right ventricular size is normal. No increase in right ventricular  wall thickness. Right ventricular systolic function is normal. There is normal pulmonary artery systolic pressure. The tricuspid regurgitant velocity is 2.03 m/s, and  with an assumed right atrial pressure of 3 mmHg, the estimated right ventricular systolic pressure is 83.6 mmHg. Left Atrium: Left atrial size was normal in size. Right Atrium: Right atrial size was normal in size. Pericardium: There is no evidence of pericardial effusion. Presence of epicardial fat layer. Mitral Valve: The mitral valve is normal in structure. Trivial mitral valve regurgitation. Tricuspid Valve: The tricuspid valve is not well visualized. Tricuspid valve regurgitation is trivial. Aortic Valve: The aortic valve has an indeterminant number of cusps. There is mild thickening of the aortic valve. Aortic valve regurgitation is not visualized. Aortic valve sclerosis is present, with no evidence of aortic valve stenosis. Pulmonic Valve: The pulmonic valve was not well visualized. Pulmonic valve regurgitation is not visualized. No evidence of pulmonic stenosis. Aorta: The aortic root and ascending aorta are structurally normal, with no evidence of dilitation. Pulmonary Artery: The pulmonary artery is of normal size. Venous: The inferior vena cava is normal in size with greater than 50% respiratory variability, suggesting right atrial pressure of 3 mmHg. IAS/Shunts: The interatrial septum was not well visualized.  LEFT VENTRICLE PLAX 2D LVIDd:         3.30 cm   Diastology LVIDs:         1.90 cm   LV e' medial:    6.96 cm/s LV PW:         1.00 cm   LV E/e' medial:  9.9 LV IVS:        1.00 cm   LV e' lateral:   7.40 cm/s LVOT diam:     1.70 cm   LV E/e' lateral: 9.3 LV SV:         37 LV SV Index:   22 LVOT Area:     2.27 cm  3D Volume EF:                          3D EF:        64 %                          LV EDV:       65 ml                          LV ESV:       23 ml                          LV SV:        42 ml RIGHT  VENTRICLE RV S prime:     16.00 cm/s TAPSE (M-mode): 1.8 cm LEFT ATRIUM             Index        RIGHT ATRIUM          Index LA diam:        3.30 cm 1.99 cm/m   RA Area:     7.99 cm LA Vol (A2C):   30.5 ml 18.41 ml/m  RA Volume:   13.40 ml 8.09 ml/m LA Vol (A4C):   19.4 ml 11.71 ml/m LA Biplane Vol: 24.8 ml 14.97 ml/m  AORTIC VALVE LVOT Vmax:   80.00 cm/s LVOT Vmean:  53.500 cm/s LVOT VTI:    0.163 m  AORTA Ao Root diam: 2.70 cm Ao Asc diam:  2.70 cm MITRAL VALVE               TRICUSPID VALVE MV Area (PHT): 3.61 cm    TR Peak grad:   16.5 mmHg MV Decel Time: 210 msec    TR Vmax:        203.00 cm/s MV E velocity: 68.60 cm/s MV A velocity: 77.60 cm/s  SHUNTS MV E/A ratio:  0.88        Systemic VTI:  0.16 m                            Systemic Diam: 1.70 cm Nelva Bush MD Electronically signed by Nelva Bush MD Signature Date/Time: 11/03/2021/3:58:59 PM    Final     Microbiology: Results for orders placed or performed during the hospital encounter of 11/02/21  Resp Panel by RT-PCR (Flu A&B, Covid) Anterior Nasal Swab     Status: Abnormal   Collection Time: 11/02/21 10:53 AM   Specimen: Anterior Nasal Swab  Result Value Ref Range Status   SARS Coronavirus 2 by RT PCR NEGATIVE NEGATIVE Final    Comment: (NOTE) SARS-CoV-2 target nucleic acids are NOT DETECTED.  The SARS-CoV-2 RNA is generally detectable in upper respiratory specimens during the acute phase of infection. The lowest concentration of SARS-CoV-2 viral copies this assay can detect is 138 copies/mL. A negative result does not preclude SARS-Cov-2 infection and should not be used as the sole basis for treatment or other patient management decisions. A negative result may occur with  improper specimen collection/handling, submission of specimen other than nasopharyngeal swab, presence of viral mutation(s) within the areas targeted by this assay, and inadequate number of viral copies(<138 copies/mL). A negative result must be  combined with clinical observations, patient history, and epidemiological information. The expected result is  Negative.  Fact Sheet for Patients:  EntrepreneurPulse.com.au  Fact Sheet for Healthcare Providers:  IncredibleEmployment.be  This test is no t yet approved or cleared by the [place] FDA and  has been authorized for detection and/or diagnosis of SARS-CoV-2 by FDA under an Emergency Use Authorization (EUA). This EUA will remain  in effect (meaning this test can be used) for the duration of the COVID-19 declaration under Section 564(b)(1) of the Act, 21 U.S.C.section 360bbb-3(b)(1), unless the authorization is terminated  or revoked sooner.       Influenza A by PCR POSITIVE (A) NEGATIVE Final   Influenza B by PCR NEGATIVE NEGATIVE Final    Comment: (NOTE) The Xpert Xpress SARS-CoV-2/FLU/RSV plus assay is intended as an aid in the diagnosis of influenza from Nasopharyngeal swab specimens and should not be used as a sole basis for treatment. Nasal washings and aspirates are unacceptable for Xpert Xpress SARS-CoV-2/FLU/RSV testing.  Fact Sheet for Patients: EntrepreneurPulse.com.au  Fact Sheet for Healthcare Providers: IncredibleEmployment.be  This test is not yet approved or cleared by the [place] FDA and has been authorized for detection and/or diagnosis of SARS-CoV-2 by FDA under an Emergency Use Authorization (EUA). This EUA will remain in effect (meaning this test can be used) for the duration of the COVID-19 declaration under Section 564(b)(1) of the Act, 21 U.S.C. section 360bbb-3(b)(1), unless the authorization is terminated or revoked.  Performed at [facility], [address] [place], [place] [address]     Labs: CBC: Recent Labs  Lab 11/02/21 0925 11/03/21 0522  WBC 6.2 5.3  HGB 12.5 11.2*  HCT 41.3 37.3  MCV 80.0 79.5*  PLT 334 604   Basic Metabolic  Panel: Recent Labs  Lab 11/02/21 0925 11/02/21 1543 11/03/21 0522  NA 137  --  143  K 3.0*  --  3.9  CL 98  --  107  CO2 28  --  27  GLUCOSE 148*  --  101*  BUN 14  --  11  CREATININE 1.30*  --  0.94  CALCIUM 8.5*  --  8.1*  MG  --  2.1  --    Liver Function Tests: Recent Labs  Lab 11/03/21 0522  AST 43*  ALT 35  ALKPHOS 53  BILITOT 0.5  PROT 6.6  ALBUMIN 2.8*   CBG: Recent Labs  Lab 11/02/21 1733 11/02/21 2100 11/03/21 0735 11/03/21 1158 11/03/21 1558  GLUCAP 110* 165* 120* 171* 151*    Discharge time spent: greater than 30 minutes.  Signed: Jennye Boroughs, MD [facility] 11/03/2021

## 2021-11-04 ENCOUNTER — Telehealth: Payer: Self-pay

## 2021-11-04 NOTE — Telephone Encounter (Signed)
Transition Care Management Follow-up Telephone Call Date of discharge and from where: [place] 11-03-21 Dx: Influenza A How have you been since you were released from the hospital? Doing ok  Any questions or concerns? No  Items Reviewed: Did the pt receive and understand the discharge instructions provided? Yes  Medications obtained and verified? Yes  Other? No  Any new allergies since your discharge? No  Dietary orders reviewed? Yes Do you have support at home? Yes   Home Care and Equipment/Supplies: Were home health services ordered? no If so, what is the name of the agency? na  Has the agency set up a time to come to the patient's home? not applicable Were any new equipment or medical supplies ordered?  No What is the name of the medical supply agency? na Were you able to get the supplies/equipment? not applicable Do you have any questions related to the use of the equipment or supplies? No  Functional Questionnaire: (I = Independent and D = Dependent) ADLs: I  Bathing/Dressing- I  Meal Prep- I  Eating- I  Maintaining continence- I  Transferring/Ambulation- I  Managing Meds- I  Follow up appointments reviewed:  PCP Hospital f/u appt confirmed? Yes  Scheduled to see Dr Diona Browner on 11-10-21 @ 340pm. [facility] f/u appt confirmed? No  . Are transportation arrangements needed? No  If their condition worsens, is the pt aware to call PCP or go to the Emergency Dept.? Yes Was the patient provided with contact information for the PCP's office or ED? Yes Was to pt encouraged to call back with questions or concerns? Yes

## 2021-11-06 ENCOUNTER — Other Ambulatory Visit: Payer: Self-pay | Admitting: Gastroenterology

## 2021-11-06 DIAGNOSIS — K861 Other chronic pancreatitis: Secondary | ICD-10-CM

## 2021-11-10 ENCOUNTER — Encounter: Payer: Medicare Other | Admitting: Physical Therapy

## 2021-11-10 ENCOUNTER — Ambulatory Visit (INDEPENDENT_AMBULATORY_CARE_PROVIDER_SITE_OTHER): Payer: Medicare Other | Admitting: Family Medicine

## 2021-11-10 ENCOUNTER — Encounter: Payer: Self-pay | Admitting: Family Medicine

## 2021-11-10 VITALS — BP 130/70 | HR 98 | Temp 98.7°F | Ht 61.0 in | Wt 149.4 lb

## 2021-11-10 DIAGNOSIS — J101 Influenza due to other identified influenza virus with other respiratory manifestations: Secondary | ICD-10-CM | POA: Diagnosis not present

## 2021-11-10 DIAGNOSIS — N179 Acute kidney failure, unspecified: Secondary | ICD-10-CM | POA: Diagnosis not present

## 2021-11-10 DIAGNOSIS — E876 Hypokalemia: Secondary | ICD-10-CM

## 2021-11-10 DIAGNOSIS — I9589 Other hypotension: Secondary | ICD-10-CM

## 2021-11-10 DIAGNOSIS — K861 Other chronic pancreatitis: Secondary | ICD-10-CM | POA: Diagnosis not present

## 2021-11-10 DIAGNOSIS — E119 Type 2 diabetes mellitus without complications: Secondary | ICD-10-CM

## 2021-11-10 DIAGNOSIS — E861 Hypovolemia: Secondary | ICD-10-CM

## 2021-11-10 LAB — HM DIABETES FOOT EXAM

## 2021-11-10 NOTE — Assessment & Plan Note (Signed)
Currently followed by endocrinology.  Stable, improved control on Lantus 40 units subcu daily and Prandin 1 tablet twice daily.

## 2021-11-10 NOTE — Assessment & Plan Note (Signed)
Acute, resolved with IV fluids.

## 2021-11-10 NOTE — Assessment & Plan Note (Signed)
Acute, resolved at discharge from hospital she is now back with improved p.o. intake in the last 24 to 48 hours.

## 2021-11-10 NOTE — Assessment & Plan Note (Signed)
Chronic, on budesonide for control.  She previously has been followed by Dr. Earlean Shawl but given he is retired she has requested a referral to Dr. Havery Moros with [facility].  She has upcoming MRI of the pancreas pending.  She will need a refill of her budesonide in 1 month.  I have agreed to refill this until she is established with her new GI MD.

## 2021-11-10 NOTE — Assessment & Plan Note (Addendum)
Acute, resolved with IV fluids.  Patient now able to push fluid intake. She will continue to hold her losartan hydrochlorothiazide unless the blood pressure increases above 140/90.  She had noted with some weight loss of 12 pounds in the last 6 months that her blood pressures were running lower even before she became hypovolemic.

## 2021-11-10 NOTE — Patient Instructions (Addendum)
Continue  tylenol for pain as needed at night.   Remain off BP med. Follow BP at home.. if BP > 140/90.Marland Kitchen restart losartan/HCTZ.

## 2021-11-10 NOTE — Progress Notes (Signed)
Patient ID: Debra Leach, female    DOB: Dec 18, 1950, 71 y.o.   MRN: [id number]  This visit was conducted in person.  BP 130/70   Pulse 98   Temp 98.7 F (37.1 C) (Oral)   Ht _0  (1.549 m)   Wt 149 lb 6 oz (67.8 kg)   SpO2 98%   BMI 28.22 kg/m    CC:  Chief Complaint  Patient presents with   Hospitalization Follow-up    Syncope/Influenza A    Subjective:   HPI: Debra Leach is a 71 y.o. female presenting on 11/10/2021 for Hospitalization Follow-up (Syncope/Influenza A)  Hospital admission:  5/29/-11/03/2021  Influenza A resulting in syncope, hypokalemia, AKI and hypotension  presented to the hospital with cough, shortness of breath, poor oral intake and generalized malaise of about 3 days duration and syncope on the day of admission.  Syncopal episode occurred while she was standing in the bathroom.  It was short-lived and there were no injuries reported.  She had another syncopal episode while in the emergency department triage area.  Her blood pressure was low with systolic BP in the 78L.    Had hit head... head CT negative She was treated with Tamiflu and IV fluids.  She also had hypokalemia that was repleted.  GFR and potassium returned to baseline at discharge after fluids.  Calcium was low, AST increased slightly, glucose 101  Today she reports  improved cough, congestion. No SOB, no fever  She  has decreased appetite... eating light supper.  She is pushing water  constantly.  Still feeling weak, tired.  Blood sugars are running god given decreased po intake, no lows.  Having some pain in mid  left upper back, with movement. Pain with turning or twisting.   Using tylenol for pain.   Has held the losartan HCTZ... BP Readings from Last 3 Encounters:  11/10/21 130/70  11/03/21 138/65  10/07/21 134/80   Wt Readings from Last 3 Encounters:  11/10/21 149 lb 6 oz (67.8 kg)  11/02/21 151 lb (68.5 kg)  10/07/21 157 lb 12.8 oz (71.6 kg)    Her GI  MD Dr. Ola Spurr Lantus 48 units subcu daily, ff  is retiring.  Placed referral to Dr. Havery Moros for new GI MD to follow autoimmune pancreatitis. MRI pending.      Relevant past medical, surgical, family and social history reviewed and updated as indicated. Interim medical history since our last visit reviewed. Allergies and medications reviewed and updated. Outpatient Medications Prior to Visit  Medication Sig Dispense Refill   atorvastatin (LIPITOR) 80 MG tablet Take 1 tablet (80 mg total) by mouth at bedtime.     Blood Glucose Monitoring Suppl (ONETOUCH VERIO REFLECT) w/Device KIT 2 (two) times daily. for testing     budesonide (ENTOCORT EC) 3 MG 24 hr capsule Take 2 capsules by mouth daily.     Cholecalciferol (VITAMIN D3) 1.25 MG (50000 UT) CAPS Take 1 capsule by mouth once a week. 12 capsule 3   colesevelam (WELCHOL) 625 MG tablet TAKE 3 TABLETS BY MOUTH 2 TIMES DAILY WITH A MEAL. 540 tablet 3   ELIQUIS 2.5 MG TABS tablet TAKE 1 TABLET BY MOUTH 2 TIMES DAILY. 180 tablet 0   Eptinezumab-jjmr (VYEPTI) 100 MG/ML injection Inject 300 mg into the vein.     gabapentin (NEURONTIN) 100 MG capsule TAKE 1 CAPSULE BY MOUTH UP TO 3 TIMES DAILY AS NEEDED. 270 capsule 3   gabapentin (NEURONTIN) 300 MG capsule TAKE  2 CAPSULES BY MOUTH AT BEDTIME. 180 capsule 0   glucose blood (ONETOUCH VERIO) test strip USE TO CHECK BLOOD SUGAR TWO TIMES A DAY 200 strip 3   insulin glargine (LANTUS SOLOSTAR) 100 UNIT/ML Solostar Pen Inject 40 Units into the skin daily. 45 mL 3   Insulin Pen Needle (ULTICARE SHORT PEN NEEDLES) 31G X 8 MM MISC USE TO INJECT INSULIN DAILY 100 each 3   Lancets (ONETOUCH DELICA PLUS [id number]) MISC USE TO CHECK BLOOD SUGAR TWO TIMES DAILY 200 each 3   omeprazole (PRILOSEC) 20 MG capsule Take 20 mg by mouth 2 (two) times daily before a meal.     ondansetron (ZOFRAN) 4 MG tablet Take 1 tablet (4 mg total) by mouth every 8 (eight) hours as needed for nausea or vomiting. 20 tablet 0   potassium  chloride SA (KLOR-CON M) 20 MEQ tablet TAKE 1 TABLET BY MOUTH DAILY. 90 tablet 1   repaglinide (PRANDIN) 0.5 MG tablet Take 1 tablet (0.5 mg total) by mouth 2 (two) times daily. 180 tablet 3   UBRELVY 100 MG TABS TAKE 1 TABLET BY MOUTH AS NEEDED (MAY REPEAT DOSE AFTER 2 HOURS IF NEEDED. MAXIMUM 2 TABLETS IN 24 HOURS). 10 tablet 3   vitamin B-12 (CYANOCOBALAMIN) 1000 MCG tablet Take 1 tablet by mouth daily.     losartan-hydrochlorothiazide (HYZAAR) 50-12.5 MG tablet Take 1 tablet by mouth daily. (Patient not taking: Reported on 11/10/2021) 90 tablet 3   Vitamin D, Ergocalciferol, (DRISDOL) 1.25 MG (50000 UNIT) CAPS capsule Take 50,000 Units by mouth once a week.     No facility-administered medications prior to visit.     Per HPI unless specifically indicated in ROS section below Review of Systems Objective:  BP 130/70   Pulse 98   Temp 98.7 F (37.1 C) (Oral)   Ht _0  (1.549 m)   Wt 149 lb 6 oz (67.8 kg)   SpO2 98%   BMI 28.22 kg/m   Wt Readings from Last 3 Encounters:  11/10/21 149 lb 6 oz (67.8 kg)  11/02/21 151 lb (68.5 kg)  10/07/21 157 lb 12.8 oz (71.6 kg)      Physical Exam    Diabetic foot exam: Normal inspection No skin breakdown No calluses  Normal DP pulses Normal sensation to light touch and monofilament Nails normal  Results for orders placed or performed during the hospital encounter of 11/02/21  Resp Panel by RT-PCR (Flu A&B, Covid) Anterior Nasal Swab   Specimen: Anterior Nasal Swab  Result Value Ref Range   SARS Coronavirus 2 by RT PCR NEGATIVE NEGATIVE   Influenza A by PCR POSITIVE (A) NEGATIVE   Influenza B by PCR NEGATIVE NEGATIVE  Basic metabolic panel  Result Value Ref Range   Sodium 137 135 - 145 mmol/L   Potassium 3.0 (L) 3.5 - 5.1 mmol/L   Chloride 98 98 - 111 mmol/L   CO2 28 22 - 32 mmol/L   Glucose, Bld 148 (H) 70 - 99 mg/dL   BUN 14 8 - 23 mg/dL   Creatinine, Ser 1.30 (H) 0.44 - 1.00 mg/dL   Calcium 8.5 (L) 8.9 - 10.3 mg/dL   GFR,  Estimated 44 (L) >60 mL/min   Anion gap 11 5 - 15  CBC  Result Value Ref Range   WBC 6.2 4.0 - 10.5 K/uL   RBC 5.16 (H) 3.87 - 5.11 MIL/uL   Hemoglobin 12.5 12.0 - 15.0 g/dL   HCT 41.3 36.0 - 46.0 %   MCV  80.0 80.0 - 100.0 fL   MCH 24.2 (L) 26.0 - 34.0 pg   MCHC 30.3 30.0 - 36.0 g/dL   RDW 19.5 (H) 11.5 - 15.5 %   Platelets 334 150 - 400 K/uL   nRBC 0.0 0.0 - 0.2 %  Lactic acid, plasma  Result Value Ref Range   Lactic Acid, Venous 1.1 0.5 - 1.9 mmol/L  Lactic acid, plasma  Result Value Ref Range   Lactic Acid, Venous 1.7 0.5 - 1.9 mmol/L  HIV Antibody (routine testing w rflx)  Result Value Ref Range   HIV Screen 4th Generation wRfx Non Reactive Non Reactive  TSH  Result Value Ref Range   TSH 0.814 0.350 - 4.500 uIU/mL  Urinalysis, Complete w Microscopic  Result Value Ref Range   Color, Urine YELLOW (A) YELLOW   APPearance HAZY (A) CLEAR   Specific Gravity, Urine 1.017 1.005 - 1.030   pH 6.0 5.0 - 8.0   Glucose, UA NEGATIVE NEGATIVE mg/dL   Hgb urine dipstick NEGATIVE NEGATIVE   Bilirubin Urine NEGATIVE NEGATIVE   Ketones, ur NEGATIVE NEGATIVE mg/dL   Protein, ur NEGATIVE NEGATIVE mg/dL   Nitrite NEGATIVE NEGATIVE   Leukocytes,Ua SMALL (A) NEGATIVE   RBC / HPF 0-5 0 - 5 RBC/hpf   WBC, UA 11-20 0 - 5 WBC/hpf   Bacteria, UA RARE (A) NONE SEEN   Squamous Epithelial / LPF 6-10 0 - 5   Mucus PRESENT   Magnesium  Result Value Ref Range   Magnesium 2.1 1.7 - 2.4 mg/dL  Cortisol  Result Value Ref Range   Cortisol, Plasma 1.4 ug/dL  Sedimentation rate  Result Value Ref Range   Sed Rate 60 (H) 0 - 30 mm/hr  C-reactive protein  Result Value Ref Range   CRP 0.6 <1.0 mg/dL  Procalcitonin - Baseline  Result Value Ref Range   Procalcitonin <0.10 ng/mL  Glucose, capillary  Result Value Ref Range   Glucose-Capillary 66 (L) 70 - 99 mg/dL  Comprehensive metabolic panel  Result Value Ref Range   Sodium 143 135 - 145 mmol/L   Potassium 3.9 3.5 - 5.1 mmol/L   Chloride 107  98 - 111 mmol/L   CO2 27 22 - 32 mmol/L   Glucose, Bld 101 (H) 70 - 99 mg/dL   BUN 11 8 - 23 mg/dL   Creatinine, Ser 0.94 0.44 - 1.00 mg/dL   Calcium 8.1 (L) 8.9 - 10.3 mg/dL   Total Protein 6.6 6.5 - 8.1 g/dL   Albumin 2.8 (L) 3.5 - 5.0 g/dL   AST 43 (H) 15 - 41 U/L   ALT 35 0 - 44 U/L   Alkaline Phosphatase 53 38 - 126 U/L   Total Bilirubin 0.5 0.3 - 1.2 mg/dL   GFR, Estimated >60 >60 mL/min   Anion gap 9 5 - 15  CBC  Result Value Ref Range   WBC 5.3 4.0 - 10.5 K/uL   RBC 4.69 3.87 - 5.11 MIL/uL   Hemoglobin 11.2 (L) 12.0 - 15.0 g/dL   HCT 37.3 36.0 - 46.0 %   MCV 79.5 (L) 80.0 - 100.0 fL   MCH 23.9 (L) 26.0 - 34.0 pg   MCHC 30.0 30.0 - 36.0 g/dL   RDW 19.2 (H) 11.5 - 15.5 %   Platelets 287 150 - 400 K/uL   nRBC 0.0 0.0 - 0.2 %  Glucose, capillary  Result Value Ref Range   Glucose-Capillary 59 (L) 70 - 99 mg/dL  Glucose, capillary  Result Value  Ref Range   Glucose-Capillary 45 (L) 70 - 99 mg/dL  Glucose, capillary  Result Value Ref Range   Glucose-Capillary 110 (H) 70 - 99 mg/dL  Glucose, capillary  Result Value Ref Range   Glucose-Capillary 165 (H) 70 - 99 mg/dL  Glucose, capillary  Result Value Ref Range   Glucose-Capillary 120 (H) 70 - 99 mg/dL  Glucose, capillary  Result Value Ref Range   Glucose-Capillary 171 (H) 70 - 99 mg/dL  Glucose, capillary  Result Value Ref Range   Glucose-Capillary 151 (H) 70 - 99 mg/dL  CBG monitoring, ED  Result Value Ref Range   Glucose-Capillary 127 (H) 70 - 99 mg/dL  ECHOCARDIOGRAM COMPLETE  Result Value Ref Range   Weight 2,416 oz   Height 60 in   BP 128/59 mmHg   S' Lateral 1.90 cm   Area-P 1/2 3.61 cm2  Troponin I (High Sensitivity)  Result Value Ref Range   Troponin I (High Sensitivity) 9 <18 ng/L  Troponin I (High Sensitivity)  Result Value Ref Range   Troponin I (High Sensitivity) 8 <18 ng/L  Troponin I (High Sensitivity)  Result Value Ref Range   Troponin I (High Sensitivity) 8 <18 ng/L     COVID 19  screen:  No recent travel or known exposure to COVID19 The patient denies respiratory symptoms of COVID 19 at this time. The importance of social distancing was discussed today.   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  She previously has been followed by Dr. Earlean Shawl but given he is retired she has requested a referral to Dr. Havery Moros with [facility].  She has upcoming MRI of the pancreas pending.  She will need a refill of her budesonide in 1 month.  I have agreed to refill this until she is established with her new GI MD.       Relevant Orders   Ambulatory referral to Gastroenterology   AKI (acute kidney injury) ([place])    Acute, resolved with IV fluids.       Diabetes mellitus with no complication [facility])    Currently followed by endocrinology.  Stable, improved control on Lantus 40 units subcu daily and Prandin 1 tablet twice daily.       Hypokalemia    Acute, resolved at discharge from hospital she is now back with improved p.o. intake in the last 24 to 48 hours.       Hypotension due to hypovolemia - Primary    Acute, resolved with IV fluids.  Patient now able to push fluid intake. She will continue to hold her losartan hydrochlorothiazide unless the blood pressure increases above 140/90.  She had noted with some weight loss of 12 pounds in the last 6 months that her blood pressures were running lower even before she became hypovolemic.       Influenza A    Acute, improving  Status post Tamiflu.           Eliezer Lofts, MD

## 2021-11-10 NOTE — Assessment & Plan Note (Signed)
Acute, improving  Status post Tamiflu.

## 2021-11-11 ENCOUNTER — Encounter: Payer: Self-pay | Admitting: Family Medicine

## 2021-11-13 ENCOUNTER — Encounter: Payer: Self-pay | Admitting: Family Medicine

## 2021-11-13 ENCOUNTER — Other Ambulatory Visit: Payer: Self-pay | Admitting: Family Medicine

## 2021-11-13 MED ORDER — CYCLOBENZAPRINE HCL 10 MG PO TABS: 5.0000 mg | ORAL_TABLET | Freq: Three times a day (TID) | ORAL | 0 refills | Status: DC | PRN

## 2021-11-15 ENCOUNTER — Ambulatory Visit (INDEPENDENT_AMBULATORY_CARE_PROVIDER_SITE_OTHER): Payer: Medicare Other

## 2021-11-15 ENCOUNTER — Ambulatory Visit
Admission: RE | Admit: 2021-11-15 | Discharge: 2021-11-15 | Disposition: A | Discharge status: Home / Self Care | Payer: Medicare Other | Source: Ambulatory Visit | Attending: Emergency Medicine | Admitting: Emergency Medicine

## 2021-11-15 VITALS — BP 145/80 | HR 99 | Temp 99.3°F | Resp 16

## 2021-11-15 DIAGNOSIS — M546 Pain in thoracic spine: Secondary | ICD-10-CM

## 2021-11-15 MED ORDER — PREDNISONE 10 MG PO TABS: 20.0000 mg | ORAL_TABLET | Freq: Every day | ORAL | 0 refills | Status: AC

## 2021-11-15 NOTE — ED Provider Notes (Signed)
Debra Leach   MRN: [id number] DOB: 1951-05-04  Subjective:   Chief Complaint;  Chief Complaint  Patient presents with   Back Pain   Pt reports left lower back pain since May 30th. States she  got the flu on 10/30/21; on 11/02/21 back pains began. States bending over, turning, getting in and out of bed, and turning over when lying down cause sharp pain.  Muscle relaxant has not helped the pain.  Debra Leach is a 71 y.o. female presenting for ongoing back pain since having the flu and having a syncopal episode.  She has no more symptoms of flu and denies fever however the pain in her left upper back persists and is exacerbated by movement.  She has tried a muscle relaxant which is not helpful and is on daily low-dose steroid so she has not taking any NSAIDs.  Tylenol has helped mildly with the pain  No current facility-administered medications for this encounter.  Current Outpatient Medications:    predniSONE (DELTASONE) 10 MG tablet, Take 2 tablets (20 mg total) by mouth daily for 5 days., Disp: 10 tablet, Rfl: 0   atorvastatin (LIPITOR) 80 MG tablet, Take 1 tablet (80 mg total) by mouth at bedtime., Disp: , Rfl:    Blood Glucose Monitoring Suppl (ONETOUCH VERIO REFLECT) w/Device KIT, 2 (two) times daily. for testing, Disp: , Rfl:    budesonide (ENTOCORT EC) 3 MG 24 hr capsule, Take 2 capsules by mouth daily., Disp: , Rfl:    Cholecalciferol (VITAMIN D3) 1.25 MG (50000 UT) CAPS, Take 1 capsule by mouth once a week., Disp: 12 capsule, Rfl: 3   colesevelam (WELCHOL) 625 MG tablet, TAKE 3 TABLETS BY MOUTH 2 TIMES DAILY WITH A MEAL., Disp: 540 tablet, Rfl: 3   cyclobenzaprine (FLEXERIL) 10 MG tablet, Take 0.5-1 tablets (5-10 mg total) by mouth 3 (three) times daily as needed for muscle spasms., Disp: 15 tablet, Rfl: 0   ELIQUIS 2.5 MG TABS tablet, TAKE 1 TABLET BY MOUTH 2 TIMES DAILY., Disp: 180 tablet, Rfl: 0   Eptinezumab-jjmr (VYEPTI) 100 MG/ML injection,  Inject 300 mg into the vein., Disp: , Rfl:    gabapentin (NEURONTIN) 100 MG capsule, TAKE 1 CAPSULE BY MOUTH UP TO 3 TIMES DAILY AS NEEDED., Disp: 270 capsule, Rfl: 3   gabapentin (NEURONTIN) 300 MG capsule, TAKE 2 CAPSULES BY MOUTH AT BEDTIME., Disp: 180 capsule, Rfl: 0   glucose blood (ONETOUCH VERIO) test strip, USE TO CHECK BLOOD SUGAR TWO TIMES A DAY, Disp: 200 strip, Rfl: 3   insulin glargine (LANTUS SOLOSTAR) 100 UNIT/ML Solostar Pen, Inject 40 Units into the skin daily., Disp: 45 mL, Rfl: 3   Insulin Pen Needle (ULTICARE SHORT PEN NEEDLES) 31G X 8 MM MISC, USE TO INJECT INSULIN DAILY, Disp: 100 each, Rfl: 3   Lancets (ONETOUCH DELICA PLUS [id number]) MISC, USE TO CHECK BLOOD SUGAR TWO TIMES DAILY, Disp: 200 each, Rfl: 3   omeprazole (PRILOSEC) 20 MG capsule, Take 20 mg by mouth 2 (two) times daily before a meal., Disp: , Rfl:    ondansetron (ZOFRAN) 4 MG tablet, Take 1 tablet (4 mg total) by mouth every 8 (eight) hours as needed for nausea or vomiting., Disp: 20 tablet, Rfl: 0   potassium chloride SA (KLOR-CON M) 20 MEQ tablet, TAKE 1 TABLET BY MOUTH DAILY., Disp: 90 tablet, Rfl: 1   repaglinide (PRANDIN) 0.5 MG tablet, Take 1 tablet (0.5 mg total) by mouth 2 (two) times daily., Disp: 180 tablet,  Rfl: 3   UBRELVY 100 MG TABS, TAKE 1 TABLET BY MOUTH AS NEEDED (MAY REPEAT DOSE AFTER 2 HOURS IF NEEDED. MAXIMUM 2 TABLETS IN 24 HOURS)., Disp: 10 tablet, Rfl: 3   vitamin B-12 (CYANOCOBALAMIN) 1000 MCG tablet, Take 1 tablet by mouth daily., Disp: , Rfl:    Allergies  Allergen Reactions   Atenolol    Codeine     REACTION: Migraine   Ibuprofen    Jardiance [Empagliflozin]     Caused uti   Penicillins Itching    Has patient had a PCN reaction causing immediate rash, facial/tongue/throat swelling, SOB or lightheadedness with hypotension: No Has patient had a PCN reaction causing severe rash involving mucus membranes or skin necrosis: No Has patient had a PCN reaction that required  hospitalization: No Has patient had a PCN reaction occurring within the last 10 years: No If all of the above answers are "NO", then may proceed with Cephalosporin use.    Pentazocine Lactate     REACTION: Swelling, itching, rash   Propranolol Other (See Comments)   Talwin [Pentazocine] Other (See Comments)    Swelling and itching    Crestor [Rosuvastatin] Other (See Comments)   Sudafed [Pseudoephedrine Hcl] Itching and Anxiety    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[place])    Clotting disorder (HCC)    Complication of anesthesia    difficult waking   Diabetes mellitus without complication (HCC)    Diet and exercise controlled   GERD (gastroesophageal reflux disease)    Headache    History of kidney stones    Hyperlipidemia    Hypertension    MVP (mitral valve prolapse)    history of   PONV (postoperative nausea and vomiting)      Review of Systems  All other systems reviewed and are negative.    Objective:   Vitals: BP (!) 145/80 (BP Location: Left Arm)   Pulse 99   Temp 99.3 F (37.4 C) (Oral)   Resp 16   SpO2 96%   Physical Exam Vitals and nursing note reviewed.  Constitutional:      General: She is not in acute distress.    Appearance: She is well-developed. She is obese.  HENT:     Head: Normocephalic and atraumatic.  Eyes:     Conjunctiva/sclera: Conjunctivae normal.  Cardiovascular:     Rate and Rhythm: Normal rate and regular rhythm.     Heart sounds: No murmur heard. Pulmonary:     Effort: Pulmonary effort is normal. No respiratory distress.     Breath sounds: Normal breath sounds.  Abdominal:     Palpations: Abdomen is soft.     Tenderness: There is no abdominal tenderness.  Musculoskeletal:        General: Tenderness present. No swelling.     Cervical back: Neck supple.     Comments: Tenderness to palpation just below the deep left  rhomboids, no bruising no significant pain on compression AP and lateral chest wall, no deformity or rash  Skin:    General: Skin is warm and dry.     Capillary Refill: Capillary refill takes less than 2 seconds.     Findings: No rash.  Neurological:     Mental Status: She is alert.  Psychiatric:        Mood and Affect: Mood normal.     No results  found for this or any previous visit (from the past 24 hour(s)).  DG Chest 2 View  Result Date: 11/15/2021 CLINICAL DATA:  Left posterior back pain, shortness of breath EXAM: CHEST - 2 VIEW COMPARISON:  Radiograph 03/01/2019 FINDINGS: Unchanged cardiomediastinal silhouette. Hazy right basilar opacity. Left lung is clear. No pleural effusion. No pneumothorax. No acute osseous abnormality. Thoracic spondylosis. IMPRESSION: Hazy right basilar opacity, likely subsegmental atelectasis. Left lung is clear. Electronically Signed   By: Maurine Simmering M.D.   On: 11/15/2021 13:07      A trigger point injection was performed at the site of maximal tenderness left upper back rhomboid region persistent left upper back pain using 1% plain Lidocaine 1cc and Kenalog 43m. This was well tolerated, and followed by some relief of pain.  Assessment and Plan :   1. Acute left-sided thoracic back pain     Meds ordered this encounter  Medications   predniSONE (DELTASONE) 10 MG tablet    Sig: Take 2 tablets (20 mg total) by mouth daily for 5 days.    Dispense:  10 tablet    Refill:  0    MDM:  MConny Situis a 71y.o. female presenting for ongoing left upper back pain since fall on 5/31 status post syncopal episode.  Her left upper back pain was all reproducible with movement and is reproducible around the area left rhomboid muscles.  There is no obvious rash or bruising or trauma to the chest wall her physical exam was otherwise unremarkable.  We did a trial of a trigger point injection and prescribed steroids even though she is on low-dose ongoing  steroid treatment to see if that would help the sharp stabbing pain.  Patient did not find good relief from her muscle relaxant she is advised to continue it at night if it helped her sleep.  She will follow-up with her PCP regarding right basilar opacity most likely related to her recent fall.  Her physical exam and symptoms were not consistent with a pneumonia.  I discussed todays findings, treatment plan, follow up and return instructions. Questions were answered. Patient/representative stated understanding of the instructions and patient is stable for discharge.  CLeida LauthFNP-C MSN    BHezzie Bump NP 11/15/21 1400

## 2021-11-15 NOTE — Discharge Instructions (Addendum)
Trial of steroid to decrease inflammation in the area.  Also try Biofreeze or IcyHot in the same region.  Your pain appears to be musculoskeletal in nature.  The chest x-ray showed no evidence of acute fracture but there was a small hazy opacity consistent with small amount of fluid on the right base, this would not be related to your current pain and may likely have come from the trauma on the right side a few weeks ago follow-up with your PCP to recheck this in 3 to 4 weeks sooner if you are having cough or fever you were given a trigger point injection today in the area where you are having the most pain.  Follow-up with your PCP if not improving or worse massage may be helpful and physical therapy if not improving over the next week.  You can take 2 extra strength Tylenol every 6 hours for pain control in addition to the short steroid course.  Watch your sugars and stop if your sugars are starting to elevate

## 2021-11-15 NOTE — ED Triage Notes (Signed)
Pt reports left lower back pain since May 30th. States she  got the flu on 10/30/21; on 11/02/21 back pains began. States bending over, turning, getting in and out of bed, and turning over when lying down cause sharp pain.  Muscle relaxant has not helped the pain.

## 2021-11-16 ENCOUNTER — Telehealth: Payer: Self-pay

## 2021-11-16 ENCOUNTER — Encounter: Payer: Self-pay | Admitting: Family Medicine

## 2021-11-16 NOTE — Telephone Encounter (Signed)
Prior auth for Cyclobenzaprine HCl '10MG'$  tablets has been denied.  Patient Name: Debra Leach Patient DOB: 08-24-1950 Patient ID: [id number] Status of Request: Deny Medication Name: Cyclobenzapr Tab '10mg'$  GPI/NDC: [id number] Decision Notes: Cyclobenzaprine is denied because it is not on your plan's Drug List (formulary). Medication authorization requires the following: (1) You need to try four (4) of these covered drugs: (a) EC-Naproxen, brand Naprelan, naproxen, or naproxen sodium. (b) Etodolac or brand Lodine. (c) Fenoprofen or brand Nalfon. (d) Flurbiprofen. (e) Ketoprofen. (f) Tizanidine or brand Zanaflex. (2) OR your doctor needs to give [place] specific medical reasons why four (4) of the covered drug(s) are not appropriate for you. Reviewed by: Antony Contras.Ph. **Please note: This product is on the high-risk medication list and is not recommended in patients 65 years and older due to a higher risk potential for side effects. According to the Centers for Medicare & Medicaid [place] for [employer] (NCQA) this medication should be avoided in the elderly. "Drugs to be Avoided in the Elderly" is measure 238 of the Centers for Medicare & Medicaid Services Physician Quality Reporting System. There may be safer treatment alternatives to consider.  Sent denial letter to scanning.

## 2021-11-16 NOTE — Telephone Encounter (Signed)
Let pt know muscle relaxant denied.. I can try to send in robaxin which appears to be covered or she can by this out of pocket... I believe it is low cost? she could get it with [facility] possibly.

## 2021-11-16 NOTE — Telephone Encounter (Signed)
Prior auth for Cyclobenzaprine HCl '10MG'$  tablets has been started. Rielly Kilgour [employer]ina [employer] - PA Case ID: [id number] - Rx #: [id number] Waiting for determination.

## 2021-11-17 ENCOUNTER — Encounter: Payer: Medicare Other | Admitting: Physical Therapy

## 2021-11-17 NOTE — Telephone Encounter (Signed)
Spoke with Mrs. [facility].  She paid for the cyclobenzaprine out of pocket.

## 2021-11-19 ENCOUNTER — Ambulatory Visit (INDEPENDENT_AMBULATORY_CARE_PROVIDER_SITE_OTHER): Payer: Medicare Other

## 2021-11-19 DIAGNOSIS — H53121 Transient visual loss, right eye: Secondary | ICD-10-CM

## 2021-11-19 DIAGNOSIS — H539 Unspecified visual disturbance: Secondary | ICD-10-CM

## 2021-11-20 ENCOUNTER — Ambulatory Visit
Admission: RE | Admit: 2021-11-20 | Discharge: 2021-11-20 | Disposition: A | Discharge status: Home / Self Care | Payer: Medicare Other | Source: Ambulatory Visit | Attending: Gastroenterology | Admitting: Gastroenterology

## 2021-11-20 DIAGNOSIS — K861 Other chronic pancreatitis: Secondary | ICD-10-CM | POA: Insufficient documentation

## 2021-11-20 MED ORDER — GADOBUTROL 1 MMOL/ML IV SOLN
7.0000 mL | Freq: Once | INTRAVENOUS | Status: AC | PRN
  Administered 2021-11-20: 7 mL via INTRAVENOUS

## 2021-11-24 ENCOUNTER — Encounter: Payer: Medicare Other | Admitting: Physical Therapy

## 2021-11-25 ENCOUNTER — Telehealth: Payer: Self-pay | Admitting: Gastroenterology

## 2021-11-25 NOTE — Telephone Encounter (Signed)
error 

## 2021-11-27 ENCOUNTER — Telehealth: Payer: Self-pay | Admitting: Gastroenterology

## 2021-11-27 ENCOUNTER — Encounter (INDEPENDENT_AMBULATORY_CARE_PROVIDER_SITE_OTHER): Payer: Self-pay | Admitting: Vascular Surgery

## 2021-11-27 ENCOUNTER — Encounter: Payer: Self-pay | Admitting: Family Medicine

## 2021-11-27 ENCOUNTER — Ambulatory Visit (INDEPENDENT_AMBULATORY_CARE_PROVIDER_SITE_OTHER): Payer: Medicare Other | Admitting: Vascular Surgery

## 2021-11-27 VITALS — BP 164/78 | HR 85 | Resp 16 | Ht 60.0 in | Wt 148.0 lb

## 2021-11-27 DIAGNOSIS — E119 Type 2 diabetes mellitus without complications: Secondary | ICD-10-CM

## 2021-11-27 DIAGNOSIS — I87009 Postthrombotic syndrome without complications of unspecified extremity: Secondary | ICD-10-CM | POA: Diagnosis not present

## 2021-11-27 DIAGNOSIS — M316 Other giant cell arteritis: Secondary | ICD-10-CM | POA: Diagnosis not present

## 2021-11-27 DIAGNOSIS — E1159 Type 2 diabetes mellitus with other circulatory complications: Secondary | ICD-10-CM

## 2021-11-27 DIAGNOSIS — I152 Hypertension secondary to endocrine disorders: Secondary | ICD-10-CM

## 2021-11-27 DIAGNOSIS — L821 Other seborrheic keratosis: Secondary | ICD-10-CM | POA: Insufficient documentation

## 2021-11-27 NOTE — H&P (View-Only) (Signed)
Patient ID: Debra Leach, female   DOB: [birth year], 71 y.o.   MRN: [id number]  Chief Complaint  Patient presents with   New Patient (Initial Visit)    Consult temporal arteritis    HPI Debra Leach is a 71 y.o. female.  I am asked to see the patient by Dr. Diona Browner for evaluation of visual changes and elevated sedimentation rate worrisome for temporal arteritis.  The patient has been dealing with autoimmune disorder for over 4 years and has had long treatments with steroids which have been trying to taper off.  Over the past few months, she has had multiple episodes of specific right eye visual changes which are very different from her chronic migraine visual changes she has had for many years.  These are all unilateral.  They are associated with noticeable right visual changes and nothing on the left.  Her sedimentation rate was significantly elevated at about 130 per her report.  Given these findings, she is referred for evaluation for a temporal artery biopsy.   The patient also reports a previous history of what sounds like DVT and PE.  She has had heaviness and pain in her legs since that time although it has gotten gradually better.  She was on anticoagulation.   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HCC)    Clotting disorder (HCC)    Complication of anesthesia    difficult waking   Diabetes mellitus without complication (HCC)    Diet and exercise controlled   GERD (gastroesophageal reflux disease)    Headache    History of kidney stones    Hyperlipidemia    Hypertension    MVP (mitral valve prolapse)    history of   PONV (postoperative nausea and vomiting)     Past Surgical History:  Procedure Laterality Date   arm surgery     BREAST BIOPSY Left    BREAST SURGERY  2000   breast biopsy (benign)   LITHOTRIPSY  08-12-2008   stent placed bilaterally    PARTIAL HYSTERECTOMY     Both ovaries remain, vaginal, for mennorhagia   TONSILLECTOMY     TUBAL LIGATION  1980     Family History  Problem Relation Age of Onset   Cancer Mother        bone   Hypertension Father    Mitral valve prolapse Father    Asthma Brother    Arthritis Brother    Nephrolithiasis Brother    Nephrolithiasis Brother    Asthma Brother    Arthritis Brother    Aortic aneurysm Brother        ascending aortic aneuysm   Breast cancer Maternal Aunt    Breast cancer Maternal Aunt       Social History   Tobacco Use   Smoking status: Former    Types: Cigarettes   Smokeless tobacco: Never  Vaping Use   Vaping Use: Never used  Substance Use Topics   Alcohol use: No   Drug use: No     Allergies  Allergen Reactions   Atenolol    Codeine     REACTION: Migraine   Ibuprofen    Jardiance [Empagliflozin]     Caused uti   Penicillins Itching    Has patient had a PCN reaction causing immediate rash, facial/tongue/throat swelling, SOB or lightheadedness with hypotension: No Has patient  had a PCN reaction causing severe rash involving mucus membranes or skin necrosis: No Has patient had a PCN reaction that required hospitalization: No Has patient had a PCN reaction occurring within the last 10 years: No If all of the above answers are "NO", then may proceed with Cephalosporin use.    Pentazocine Lactate     REACTION: Swelling, itching, rash   Propranolol Other (See Comments)   Talwin [Pentazocine] Other (See Comments)    Swelling and itching    Crestor [Rosuvastatin] Other (See Comments)   Sudafed [Pseudoephedrine Hcl] Itching and Anxiety    Current Outpatient Medications  Medication Sig Dispense Refill   atorvastatin (LIPITOR) 80 MG tablet Take 1 tablet (80 mg total) by mouth at bedtime.     Blood Glucose Monitoring Suppl (ONETOUCH VERIO REFLECT) w/Device KIT 2 (two) times daily. for testing     budesonide (ENTOCORT EC) 3 MG 24 hr capsule Take 2  capsules by mouth daily.     Cholecalciferol (VITAMIN D3) 1.25 MG (50000 UT) CAPS Take 1 capsule by mouth once a week. 12 capsule 3   colesevelam (WELCHOL) 625 MG tablet TAKE 3 TABLETS BY MOUTH 2 TIMES DAILY WITH A MEAL. 540 tablet 3   ELIQUIS 2.5 MG TABS tablet TAKE 1 TABLET BY MOUTH 2 TIMES DAILY. 180 tablet 0   Eptinezumab-jjmr (VYEPTI) 100 MG/ML injection Inject 300 mg into the vein.     gabapentin (NEURONTIN) 100 MG capsule TAKE 1 CAPSULE BY MOUTH UP TO 3 TIMES DAILY AS NEEDED. 270 capsule 3   gabapentin (NEURONTIN) 300 MG capsule TAKE 2 CAPSULES BY MOUTH AT BEDTIME. 180 capsule 0   glucose blood (ONETOUCH VERIO) test strip USE TO CHECK BLOOD SUGAR TWO TIMES A DAY 200 strip 3   insulin glargine (LANTUS SOLOSTAR) 100 UNIT/ML Solostar Pen Inject 40 Units into the skin daily. 45 mL 3   Insulin Pen Needle (ULTICARE SHORT PEN NEEDLES) 31G X 8 MM MISC USE TO INJECT INSULIN DAILY 100 each 3   Lancets (ONETOUCH DELICA PLUS SLHTDS28J) MISC USE TO CHECK BLOOD SUGAR TWO TIMES DAILY 200 each 3   omeprazole (PRILOSEC) 20 MG capsule Take 20 mg by mouth 2 (two) times daily before a meal.     ondansetron (ZOFRAN) 4 MG tablet Take 1 tablet (4 mg total) by mouth every 8 (eight) hours as needed for nausea or vomiting. 20 tablet 0   potassium chloride SA (KLOR-CON M) 20 MEQ tablet TAKE 1 TABLET BY MOUTH DAILY. 90 tablet 1   repaglinide (PRANDIN) 0.5 MG tablet Take 1 tablet (0.5 mg total) by mouth 2 (two) times daily. 180 tablet 3   UBRELVY 100 MG TABS TAKE 1 TABLET BY MOUTH AS NEEDED (MAY REPEAT DOSE AFTER 2 HOURS IF NEEDED. MAXIMUM 2 TABLETS IN 24 HOURS). 10 tablet 3   vitamin B-12 (CYANOCOBALAMIN) 1000 MCG tablet Take 1 tablet by mouth daily.     cyclobenzaprine (FLEXERIL) 10 MG tablet Take 0.5-1 tablets (5-10 mg total) by mouth 3 (three) times daily as needed for muscle spasms. (Patient not taking: Reported on 11/27/2021) 15 tablet 0   No current facility-administered medications for this visit.       REVIEW OF SYSTEMS (Negative unless checked)  Constitutional: '[]' Weight loss  '[]' Fever  '[]' Chills Cardiac: '[]' Chest pain   '[]' Chest pressure   '[]' Palpitations   '[]' Shortness of breath when laying flat   '[]' Shortness of breath at rest   '[]' Shortness of breath with exertion. 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   '[]' Dysphagia   '[]' Weakness or numbness in arms   '[]' Weakness or numbness in legs X positive for long term visual changes and migraines Musculoskeletal:  '[x]' Arthritis   '[]' Joint swelling   '[]' Joint pain   '[]' Low back pain Hematologic:  '[]' Easy bruising  '[]' Easy bleeding   '[]' Hypercoagulable state   '[]' Anemic  '[]' Hepatitis Gastrointestinal:  '[]' Blood in stool   '[]' Vomiting blood  '[x]' Gastroesophageal reflux/heartburn   '[]' Abdominal pain Genitourinary:  '[]' Chronic kidney disease   '[]' Difficult urination  '[]' Frequent urination  '[]' Burning with urination   '[]' Hematuria Skin:  '[]' Rashes   '[]' Ulcers   '[]' Wounds Psychological:  '[]' History of anxiety   '[]'  History of major depression.    Physical Exam BP (!) 164/78 (BP Location: Left Arm)   Pulse 85   Resp 16   Ht 5' (1.524 m)   Wt 148 lb (67.1 kg)   BMI 28.90 kg/m  Gen:  WD/WN, NAD Head: [place]/AT, No temporalis wasting.  Ear/Nose/Throat: Hearing grossly intact, nares w/o erythema or drainage, oropharynx w/o Erythema/Exudate Eyes: Conjunctiva clear, sclera non-icteric  Neck: trachea midline.  No JVD.  Pulmonary:  Good air movement, respirations not labored, no use of accessory muscles   Cardiac: RRR, no JVD Vascular:  Vessel Right Left  Radial Palpable Palpable                                   Gastrointestinal:. No masses, surgical incisions, or scars. Musculoskeletal: M/S 5/5 throughout.  Extremities without ischemic changes.  No deformity or atrophy.  Trace lower extremity edema. Neurologic: Sensation grossly intact in extremities.  Symmetrical.  Speech is fluent. Motor exam as listed above. Psychiatric: Judgment intact, Mood & affect appropriate for pt's clinical situation. Dermatologic: No rashes or ulcers noted.  No cellulitis or open wounds.    Radiology MR ABDOMEN WWO CONTRAST  Result Date: 11/20/2021 CLINICAL DATA:  Follow-up pancreatic lesions EXAM: MRI ABDOMEN WITHOUT AND WITH CONTRAST TECHNIQUE: Multiplanar multisequence MR imaging of the abdomen was performed both before and after the administration of intravenous contrast. CONTRAST:  60m GADAVIST GADOBUTROL 1 MMOL/ML IV SOLN COMPARISON:  MRI abdomen 11/05/2020 FINDINGS: Lower chest: No acute findings. Hepatobiliary: Liver is normal in size and contour with no suspicious mass identified. Area of focal fatty infiltration anteriorly adjacent to the falciform ligament. Evidence of mild hepatic steatosis. Gallbladder appears normal. No biliary ductal dilatation identified. Pancreas: Severe pancreatic atrophy with minimal parenchymal tissue visualized. No peripancreatic edema, pancreatic mass or ductal dilatation identified. Spleen:  Within normal limits in size and appearance. Adrenals/Urinary Tract: Adrenal glands appear normal. Kidneys are again lobulated with multifocal areas of cortical scarring. No hydronephrosis or suspicious renal mass identified. Stomach/Bowel: Colonic diverticulosis.  Small hiatal hernia. Vascular/Lymphatic: No pathologically enlarged lymph nodes identified. No abdominal aortic aneurysm demonstrated. Other:  No ascites. Musculoskeletal: No suspicious bone lesions identified. IMPRESSION:  1. Severe pancreatic atrophy with no acute process or pancreatic mass identified. 2. Hepatic steatosis. 3. Colonic diverticulosis. 4. Small hiatal hernia. 5. Chronic scarring changes in the kidneys. Electronically Signed   By: Ofilia Neas M.D.   On: 11/20/2021 15:06   VAS US CAROTID  Result Date: 11/19/2021 Carotid Arterial Duplex Study Patient Name:  TRYSTAN EADS  Date of Exam:   11/19/2021 Medical Rec #: [id number]               Accession #:    [id number] Date of Birth: Mar 28, 1951              Patient Gender: F Patient Age:   31 years Exam Location:  [place] Procedure:      VAS US CAROTID Referring Phys: AMY BEDSOLE --------------------------------------------------------------------------------  Indications:       Visual disturbance. Risk Factors:      Hypertension, hyperlipidemia, Diabetes, past history of                    smoking. Other Factors:     Twice in the past six weeks ago patient temporarily loss                    vision in her right eye. Comparison Study:  11/3891-TDSK 87/68 cm/s; LICA 11/57 cm/s Performing Technologist: Wilkie Aye RVT  Examination Guidelines: A complete evaluation includes B-mode imaging, spectral Doppler, color Doppler, and power Doppler as needed of all accessible portions of each vessel. Bilateral testing is considered an integral part of a complete examination. Limited examinations for reoccurring indications may be performed as noted.  Right Carotid Findings: +----------+--------+--------+--------+------------------+--------+           PSV cm/sEDV cm/sStenosisPlaque DescriptionComments +----------+--------+--------+--------+------------------+--------+ CCA Prox  113     20                                         +----------+--------+--------+--------+------------------+--------+ CCA Distal59      11                                         +----------+--------+--------+--------+------------------+--------+ ICA Prox  58      19       Normal                             +----------+--------+--------+--------+------------------+--------+ ICA Mid   75      29                                         +----------+--------+--------+--------+------------------+--------+ ICA Distal54      23                                         +----------+--------+--------+--------+------------------+--------+ ECA       117     16                                         +----------+--------+--------+--------+------------------+--------+ +----------+--------+-------+----------------+-------------------+  PSV cm/sEDV cmsDescribe        Arm Pressure (mmHG) +----------+--------+-------+----------------+-------------------+ [id number]            Multiphasic, [id number]                 +----------+--------+-------+----------------+-------------------+ +---------+--------+--+--------+--+---------+ VertebralPSV cm/s41EDV cm/s11Antegrade +---------+--------+--+--------+--+---------+  Left Carotid Findings: +----------+--------+--------+--------+------------------+--------+           PSV cm/sEDV cm/sStenosisPlaque DescriptionComments +----------+--------+--------+--------+------------------+--------+ CCA Prox  112     28                                         +----------+--------+--------+--------+------------------+--------+ CCA Distal62      17                                         +----------+--------+--------+--------+------------------+--------+ ICA Prox  54      15      Normal                             +----------+--------+--------+--------+------------------+--------+ ICA Mid   45      16                                         +----------+--------+--------+--------+------------------+--------+ ICA Distal59      16                                         +----------+--------+--------+--------+------------------+--------+ ECA       80      16                                          +----------+--------+--------+--------+------------------+--------+ +----------+--------+--------+----------------+-------------------+           PSV cm/sEDV cm/sDescribe        Arm Pressure (mmHG) +----------+--------+--------+----------------+-------------------+ [id number]             Multiphasic, [id number]                 +----------+--------+--------+----------------+-------------------+ +---------+--------+--+--------+--+---------+ VertebralPSV cm/s65EDV cm/s21Antegrade +---------+--------+--+--------+--+---------+   Summary: Right Carotid: There is no evidence of stenosis in the right ICA. Left Carotid: There is no evidence of stenosis in the left ICA. Vertebrals:  Bilateral vertebral arteries demonstrate antegrade flow. Subclavians: Normal flow hemodynamics were seen in bilateral subclavian              arteries. *See table(s) above for measurements and observations.  Electronically signed by Berniece Salines DO on 11/19/2021 at 6:52:48 PM.    Final    DG Chest 2 View  Result Date: 11/15/2021 CLINICAL DATA:  Left posterior back pain, shortness of breath EXAM: CHEST - 2 VIEW COMPARISON:  Radiograph 03/01/2019 FINDINGS: Unchanged cardiomediastinal silhouette. Hazy right basilar opacity. Left lung is clear. No pleural effusion. No pneumothorax. No acute osseous abnormality. Thoracic spondylosis. IMPRESSION: Hazy right basilar opacity, likely subsegmental atelectasis. Left lung is clear. Electronically Signed   By: Maurine Simmering M.D.   On: 11/15/2021 13:07   ECHOCARDIOGRAM COMPLETE  Result Date:  11/03/2021    ECHOCARDIOGRAM REPORT   Patient Name:   ROOSEVELT BISHER Date of Exam: 11/03/2021 Medical Rec #:  [id number]              Height:       60.0 in Accession #:    [id number]             Weight:       151.0 lb Date of Birth:  [birth year]             BSA:          1.656 m Patient Age:    59 years               BP:           128/59 mmHg Patient Gender: F                       HR:           87 bpm. Exam Location:  Inpatient Procedure: 2D Echo, 3D Echo, Cardiac Doppler and Color Doppler Indications:     Syncope R55  History:         Patient has prior history of Echocardiogram examinations, most                  recent 03/02/2019. Risk Factors:Hypertension, Diabetes and                  Dyslipidemia. GERD. Flu.  Sonographer:     Darlina Sicilian RDCS Referring Phys:  [id number] SARA-MAIZ A THOMAS Diagnosing Phys: Nelva Bush MD IMPRESSIONS  1. Left ventricular ejection fraction, by estimation, is 60 to 65%. The left ventricle has normal function. The left ventricle has no regional wall motion abnormalities. Left ventricular diastolic parameters are consistent with Grade I diastolic dysfunction (impaired relaxation).  2. Right ventricular systolic function is normal. The right ventricular size is normal. There is normal pulmonary artery systolic pressure.  3. The mitral valve is normal in structure. Trivial mitral valve regurgitation.  4. The aortic valve has an indeterminant number of cusps. There is mild thickening of the aortic valve. Aortic valve regurgitation is not visualized. Aortic valve sclerosis is present, with no evidence of aortic valve stenosis.  5. The inferior vena cava is normal in size with greater than 50% respiratory variability, suggesting right atrial pressure of 3 mmHg. FINDINGS  Left Ventricle: Left ventricular ejection fraction, by estimation, is 60 to 65%. The left ventricle has normal function. The left ventricle has no regional wall motion abnormalities. The left ventricular internal cavity size was normal in size. There is  borderline left ventricular hypertrophy. Left ventricular diastolic parameters are consistent with Grade I diastolic dysfunction (impaired relaxation). Right Ventricle: The right ventricular size is normal. No increase in right ventricular wall thickness. Right ventricular systolic function is normal. There is normal pulmonary artery systolic  pressure. The tricuspid regurgitant velocity is 2.03 m/s, and  with an assumed right atrial pressure of 3 mmHg, the estimated right ventricular systolic pressure is 29.9 mmHg. Left Atrium: Left atrial size was normal in size. Right Atrium: Right atrial size was normal in size. Pericardium: There is no evidence of pericardial effusion. Presence of epicardial fat layer. Mitral Valve: The mitral valve is normal in structure. Trivial mitral valve regurgitation. Tricuspid Valve: The tricuspid valve is not well visualized. Tricuspid valve regurgitation is trivial. Aortic Valve: The aortic valve has an indeterminant number of cusps. There is mild thickening  of the aortic valve. Aortic valve regurgitation is not visualized. Aortic valve sclerosis is present, with no evidence of aortic valve stenosis. Pulmonic Valve: The pulmonic valve was not well visualized. Pulmonic valve regurgitation is not visualized. No evidence of pulmonic stenosis. Aorta: The aortic root and ascending aorta are structurally normal, with no evidence of dilitation. Pulmonary Artery: The pulmonary artery is of normal size. Venous: The inferior vena cava is normal in size with greater than 50% respiratory variability, suggesting right atrial pressure of 3 mmHg. IAS/Shunts: The interatrial septum was not well visualized.  LEFT VENTRICLE PLAX 2D LVIDd:         3.30 cm   Diastology LVIDs:         1.90 cm   LV e' medial:    6.96 cm/s LV PW:         1.00 cm   LV E/e' medial:  9.9 LV IVS:        1.00 cm   LV e' lateral:   7.40 cm/s LVOT diam:     1.70 cm   LV E/e' lateral: 9.3 LV SV:         37 LV SV Index:   22 LVOT Area:     2.27 cm                           3D Volume EF:                          3D EF:        64 %                          LV EDV:       65 ml                          LV ESV:       23 ml                          LV SV:        42 ml RIGHT VENTRICLE RV S prime:     16.00 cm/s TAPSE (M-mode): 1.8 cm LEFT ATRIUM             Index        RIGHT  ATRIUM          Index LA diam:        3.30 cm 1.99 cm/m   RA Area:     7.99 cm LA Vol (A2C):   30.5 ml 18.41 ml/m  RA Volume:   13.40 ml 8.09 ml/m LA Vol (A4C):   19.4 ml 11.71 ml/m LA Biplane Vol: 24.8 ml 14.97 ml/m  AORTIC VALVE LVOT Vmax:   80.00 cm/s LVOT Vmean:  53.500 cm/s LVOT VTI:    0.163 m  AORTA Ao Root diam: 2.70 cm Ao Asc diam:  2.70 cm MITRAL VALVE               TRICUSPID VALVE MV Area (PHT): 3.61 cm    TR Peak grad:   16.5 mmHg MV Decel Time: 210 msec    TR Vmax:        203.00 cm/s MV E velocity: 68.60 cm/s MV A velocity: 77.60 cm/s  SHUNTS MV E/A ratio:  0.88  Systemic VTI:  0.16 m                            Systemic Diam: 1.70 cm Nelva Bush MD Electronically signed by Nelva Bush MD Signature Date/Time: 11/03/2021/3:58:59 PM    Final    US Venous Img Lower Unilateral Right  Result Date: 11/02/2021 CLINICAL DATA:  Worsening right leg swelling. History of previous DVT. EXAM: 04/07/2020. LOWER EXTREMITY VENOUS DOPPLER ULTRASOUND TECHNIQUE: Gray-scale sonography with compression, as well as color and duplex ultrasound, were performed to evaluate the deep venous system(s) from the level of the common femoral vein through the popliteal and proximal calf veins. COMPARISON:  None Available. FINDINGS: VENOUS Normal compressibility of the common femoral, superficial femoral, and popliteal veins, as well as the visualized calf veins. Visualized portions of profunda femoral vein and great saphenous vein unremarkable. No filling defects to suggest DVT on grayscale or color Doppler imaging. Doppler waveforms show normal direction of venous flow, normal respiratory plasticity and response to augmentation. Limited views of the contralateral common femoral vein are unremarkable. OTHER None. Limitations: none IMPRESSION: Negative. Electronically Signed   By: Claudie Revering M.D.   On: 11/02/2021 11:30   CT Angio Chest PE W and/or Wo Contrast  Result Date: 11/02/2021 CLINICAL DATA:  Cough and  syncopal episode today. Lower extremity DVT. High clinical suspicion for pulmonary embolism. EXAM: CT ANGIOGRAPHY CHEST WITH CONTRAST TECHNIQUE: Multidetector CT imaging of the chest was performed using the standard protocol during bolus administration of intravenous contrast. Multiplanar CT image reconstructions and MIPs were obtained to evaluate the vascular anatomy. RADIATION DOSE REDUCTION: This exam was performed according to the departmental dose-optimization program which includes automated exposure control, adjustment of the mA and/or kV according to patient size and/or use of iterative reconstruction technique. CONTRAST:  32m OMNIPAQUE IOHEXOL 350 MG/ML SOLN COMPARISON:  12/19/2019 FINDINGS: Cardiovascular: Satisfactory opacification of pulmonary arteries noted, and no pulmonary emboli identified. No evidence of thoracic aortic dissection or aneurysm. Aortic and coronary atherosclerotic calcification incidentally noted. Mediastinum/Nodes: Multiple small less than 1 cm low-attenuation thyroid nodules are seen, decreased since prior study (no followup imaging recommended). Stable mild bilateral hilar lymphadenopathy. Stable shotty less than 1 cm mediastinal lymph nodes. Stable elevation of right hemidiaphragm. Lungs/Pleura: Stable mild right basilar atelectasis versus scarring. No evidence of mass or consolidation. Upper abdomen: Stable small hiatal hernia. Musculoskeletal: No suspicious bone lesions identified. Review of the MIP images confirms the above findings. IMPRESSION: No evidence of pulmonary embolism or other acute findings. Stable mild bilateral hilar lymphadenopathy. Stable small hiatal hernia. Aortic Atherosclerosis (ICD10-I70.0). Electronically Signed   By: JMarlaine HindM.D.   On: 11/02/2021 11:26   CT Head Wo Contrast  Result Date: 11/02/2021 CLINICAL DATA:  Syncopal episode in bathroom this morning. EXAM: CT HEAD WITHOUT CONTRAST TECHNIQUE: Contiguous axial images were obtained from the  base of the skull through the vertex without intravenous contrast. RADIATION DOSE REDUCTION: This exam was performed according to the departmental dose-optimization program which includes automated exposure control, adjustment of the mA and/or kV according to patient size and/or use of iterative reconstruction technique. COMPARISON:  MRI on 12/23/2020 FINDINGS: Brain: No evidence of intracranial hemorrhage, acute infarction, hydrocephalus, extra-axial collection, or mass lesion/mass effect. Vascular:  No hyperdense vessel or other acute findings. Skull: No evidence of fracture or other significant bone abnormality. Sinuses/Orbits:  No acute findings. Other: None. IMPRESSION: Negative noncontrast head CT. Electronically Signed   By: JLinus Mako  Kris Hartmann M.D.   On: 11/02/2021 11:16    Labs Recent Results (from the past 2160 hour(s))  HM DIABETES EYE EXAM     Status: None   Collection Time: 09/28/21 12:00 AM  Result Value Ref Range   HM Diabetic Eye Exam    Sedimentation Rate     Status: Abnormal   Collection Time: 10/16/21 10:19 AM  Result Value Ref Range   Sed Rate 102 (H) 0 - 30 mm/hr  Basic metabolic panel     Status: Abnormal   Collection Time: 11/02/21  9:25 AM  Result Value Ref Range   Sodium 137 135 - 145 mmol/L   Potassium 3.0 (L) 3.5 - 5.1 mmol/L   Chloride 98 98 - 111 mmol/L   CO2 28 22 - 32 mmol/L   Glucose, Bld 148 (H) 70 - 99 mg/dL    Comment: Glucose reference range applies only to samples taken after fasting for at least 8 hours.   BUN 14 8 - 23 mg/dL   Creatinine, Ser 1.30 (H) 0.44 - 1.00 mg/dL   Calcium 8.5 (L) 8.9 - 10.3 mg/dL   GFR, Estimated 44 (L) >60 mL/min    Comment: (NOTE) Calculated using the CKD-EPI Creatinine Equation (2021)    Anion gap 11 5 - 15    Comment: Performed at [facility], [address] [place], [place] [address]  CBC     Status: Abnormal   Collection Time: 11/02/21  9:25 AM  Result Value Ref Range   WBC 6.2 4.0 - 10.5 K/uL   RBC 5.16 (H)  3.87 - 5.11 MIL/uL   Hemoglobin 12.5 12.0 - 15.0 g/dL   HCT 41.3 36.0 - 46.0 %   MCV 80.0 80.0 - 100.0 fL   MCH 24.2 (L) 26.0 - 34.0 pg   MCHC 30.3 30.0 - 36.0 g/dL   RDW 19.5 (H) 11.5 - 15.5 %   Platelets 334 150 - 400 K/uL   nRBC 0.0 0.0 - 0.2 %    Comment: Performed at [facility], [address] [place], [place] [address]  CBG monitoring, ED     Status: Abnormal   Collection Time: 11/02/21  9:37 AM  Result Value Ref Range   Glucose-Capillary 127 (H) 70 - 99 mg/dL    Comment: Glucose reference range applies only to samples taken after fasting for at least 8 hours.  Lactic acid, plasma     Status: None   Collection Time: 11/02/21 10:53 AM  Result Value Ref Range   Lactic Acid, Venous 1.1 0.5 - 1.9 mmol/L    Comment: Performed at [facility], [address] [place], [place] [address]  Resp Panel by RT-PCR (Flu A&B, Covid) Anterior Nasal Swab     Status: Abnormal   Collection Time: 11/02/21 10:53 AM   Specimen: Anterior Nasal Swab  Result Value Ref Range   SARS Coronavirus 2 by RT PCR NEGATIVE NEGATIVE    Comment: (NOTE) SARS-CoV-2 target nucleic acids are NOT DETECTED.  The SARS-CoV-2 RNA is generally detectable in upper respiratory specimens during the acute phase of infection. The lowest concentration of SARS-CoV-2 viral copies this assay can detect is 138 copies/mL. A negative result does not preclude SARS-Cov-2 infection and should not be used as the sole basis for treatment or other patient management decisions. A negative result may occur with  improper specimen collection/handling, submission of specimen other than nasopharyngeal swab, presence of viral mutation(s) within the areas targeted by this assay, and inadequate number  of viral copies(<138 copies/mL). A negative result must be combined with clinical observations, patient history, and epidemiological information. The expected result is Negative.  Fact Sheet for Patients:   EntrepreneurPulse.com.au  Fact Sheet for Healthcare Providers:  IncredibleEmployment.be  This test is no t yet approved or cleared by the [place] FDA and  has been authorized for detection and/or diagnosis of SARS-CoV-2 by FDA under an Emergency Use Authorization (EUA). This EUA will remain  in effect (meaning this test can be used) for the duration of the COVID-19 declaration under Section 564(b)(1) of the Act, 21 U.S.C.section 360bbb-3(b)(1), unless the authorization is terminated  or revoked sooner.       Influenza A by PCR POSITIVE (A) NEGATIVE   Influenza B by PCR NEGATIVE NEGATIVE    Comment: (NOTE) The Xpert Xpress SARS-CoV-2/FLU/RSV plus assay is intended as an aid in the diagnosis of influenza from Nasopharyngeal swab specimens and should not be used as a sole basis for treatment. Nasal washings and aspirates are unacceptable for Xpert Xpress SARS-CoV-2/FLU/RSV testing.  Fact Sheet for Patients: EntrepreneurPulse.com.au  Fact Sheet for Healthcare Providers: IncredibleEmployment.be  This test is not yet approved or cleared by the [place] FDA and has been authorized for detection and/or diagnosis of SARS-CoV-2 by FDA under an Emergency Use Authorization (EUA). This EUA will remain in effect (meaning this test can be used) for the duration of the COVID-19 declaration under Section 564(b)(1) of the Act, 21 U.S.C. section 360bbb-3(b)(1), unless the authorization is terminated or revoked.  Performed at [facility], [place], [place] [address]   Troponin I (High Sensitivity)     Status: None   Collection Time: 11/02/21  3:43 PM  Result Value Ref Range   Troponin I (High Sensitivity) 9 <18 ng/L    Comment: (NOTE) Elevated high sensitivity troponin I (hsTnI) values and significant  changes across serial measurements may suggest ACS but many other  chronic and  acute conditions are known to elevate hsTnI results.  Refer to the "Links" section for chest pain algorithms and additional  guidance. Performed at [facility], [address] [place], [place] [address]   Lactic acid, plasma     Status: None   Collection Time: 11/02/21  3:43 PM  Result Value Ref Range   Lactic Acid, Venous 1.7 0.5 - 1.9 mmol/L    Comment: Performed at [facility], [place], [place] [address]  HIV Antibody (routine testing w rflx)     Status: None   Collection Time: 11/02/21  3:43 PM  Result Value Ref Range   HIV Screen 4th Generation wRfx Non Reactive Non Reactive    Comment: Performed at [facility], [place] [address], [place], [place] [address]  TSH     Status: None   Collection Time: 11/02/21  3:43 PM  Result Value Ref Range   TSH 0.814 0.350 - 4.500 uIU/mL    Comment: Performed by a 3rd Generation assay with a functional sensitivity of <=0.01 uIU/mL. Performed at [facility], [address], [place], [place] [address]   Magnesium     Status: None   Collection Time: 11/02/21  3:43 PM  Result Value Ref Range   Magnesium 2.1 1.7 - 2.4 mg/dL    Comment: Performed at [facility], [address] [place], [place] [address]  Cortisol     Status: None   Collection Time: 11/02/21  3:43 PM  Result Value Ref Range  Cortisol, Plasma 1.4 ug/dL    Comment: (NOTE) AM    6.7 - 22.6 ug/dL PM   <10.0       ug/dL Performed at [facility], [place] [address] [place], [place] [address]   Sedimentation rate     Status: Abnormal   Collection Time: 11/02/21  3:43 PM  Result Value Ref Range   Sed Rate 60 (H) 0 - 30 mm/hr    Comment: Performed at [facility], [address] [place], [place] [address]  C-reactive protein     Status: None   Collection Time: 11/02/21  3:43 PM  Result Value Ref Range   CRP 0.6 <1.0 mg/dL    Comment: Performed at [facility], [place] [address], [place],  [place] [address]  Procalcitonin - Baseline     Status: None   Collection Time: 11/02/21  3:43 PM  Result Value Ref Range   Procalcitonin <0.10 ng/mL    Comment:        Interpretation: PCT (Procalcitonin) <= 0.5 ng/mL: Systemic infection (sepsis) is not likely. Local bacterial infection is possible. (NOTE)       Sepsis PCT Algorithm           Lower Respiratory Tract                                      Infection PCT Algorithm    ----------------------------     ----------------------------         PCT < 0.25 ng/mL                PCT < 0.10 ng/mL          Strongly encourage             Strongly discourage   discontinuation of antibiotics    initiation of antibiotics    ----------------------------     -----------------------------       PCT 0.25 - 0.50 ng/mL            PCT 0.10 - 0.25 ng/mL               OR       >80% decrease in PCT            Discourage initiation of                                            antibiotics      Encourage discontinuation           of antibiotics    ----------------------------     -----------------------------         PCT >= 0.50 ng/mL              PCT 0.26 - 0.50 ng/mL               AND        <80% decrease in PCT             Encourage initiation of                                             antibiotics  Encourage continuation           of antibiotics    ----------------------------     -----------------------------        PCT >= 0.50 ng/mL                  PCT > 0.50 ng/mL               AND         increase in PCT                  Strongly encourage                                      initiation of antibiotics    Strongly encourage escalation           of antibiotics                                     -----------------------------                                           PCT <= 0.25 ng/mL                                                 OR                                        > 80% decrease in PCT                                       Discontinue / Do not initiate                                             antibiotics  Performed at [facility], [address] [place],  [address]   Glucose, capillary     Status: Abnormal   Collection Time: 11/02/21  4:25 PM  Result Value Ref Range   Glucose-Capillary 66 (L) 70 - 99 mg/dL    Comment: Glucose reference range applies only to samples taken after fasting for at least 8 hours.  Glucose, capillary     Status: Abnormal   Collection Time: 11/02/21  5:01 PM  Result Value Ref Range   Glucose-Capillary 59 (L) 70 - 99 mg/dL    Comment: Glucose reference range applies only to samples taken after fasting for at least 8 hours.  Glucose, capillary     Status: Abnormal   Collection Time: 11/02/21  5:17 PM  Result Value Ref Range   Glucose-Capillary 45 (L) 70 - 99 mg/dL    Comment: Glucose reference range applies only to samples taken after fasting for at least 8 hours.  Glucose, capillary     Status: Abnormal   Collection Time: 11/02/21  5:33 PM  Result Value Ref Range   Glucose-Capillary 110 (H) 70 - 99 mg/dL    Comment: Glucose reference range applies only to samples taken after fasting for at least 8 hours.  Troponin I (High Sensitivity)     Status: None   Collection Time: 11/02/21  6:05 PM  Result Value Ref Range   Troponin I (High Sensitivity) 8 <18 ng/L    Comment: (NOTE) Elevated high sensitivity troponin I (hsTnI) values and significant  changes across serial measurements may suggest ACS but many other  chronic and acute conditions are known to elevate hsTnI results.  Refer to the "Links" section for chest pain algorithms and additional  guidance. Performed at [facility], [place], [place] [address]   Troponin I (High Sensitivity)     Status: None   Collection Time: 11/02/21  7:53 PM  Result Value Ref Range   Troponin I (High Sensitivity) 8 <18 ng/L    Comment: (NOTE) Elevated high sensitivity troponin I (hsTnI)  values and significant  changes across serial measurements may suggest ACS but many other  chronic and acute conditions are known to elevate hsTnI results.  Refer to the "Links" section for chest pain algorithms and additional  guidance. Performed at [facility], [address] [place], [place] [address]   Glucose, capillary     Status: Abnormal   Collection Time: 11/02/21  9:00 PM  Result Value Ref Range   Glucose-Capillary 165 (H) 70 - 99 mg/dL    Comment: Glucose reference range applies only to samples taken after fasting for at least 8 hours.  Urinalysis, Complete w Microscopic     Status: Abnormal   Collection Time: 11/02/21 10:15 PM  Result Value Ref Range   Color, Urine YELLOW (A) YELLOW   APPearance HAZY (A) CLEAR   Specific Gravity, Urine 1.017 1.005 - 1.030   pH 6.0 5.0 - 8.0   Glucose, UA NEGATIVE NEGATIVE mg/dL   Hgb urine dipstick NEGATIVE NEGATIVE   Bilirubin Urine NEGATIVE NEGATIVE   Ketones, ur NEGATIVE NEGATIVE mg/dL   Protein, ur NEGATIVE NEGATIVE mg/dL   Nitrite NEGATIVE NEGATIVE   Leukocytes,Ua SMALL (A) NEGATIVE   RBC / HPF 0-5 0 - 5 RBC/hpf   WBC, UA 11-20 0 - 5 WBC/hpf   Bacteria, UA RARE (A) NONE SEEN   Squamous Epithelial / LPF 6-10 0 - 5   Mucus PRESENT     Comment: Performed at [facility], [address] [place], [place] [address]  Comprehensive metabolic panel     Status: Abnormal   Collection Time: 11/03/21  5:22 AM  Result Value Ref Range   Sodium 143 135 - 145 mmol/L   Potassium 3.9 3.5 - 5.1 mmol/L   Chloride 107 98 - 111 mmol/L   CO2 27 22 - 32 mmol/L   Glucose, Bld 101 (H) 70 - 99 mg/dL    Comment: Glucose reference range applies only to samples taken after fasting for at least 8 hours.   BUN 11 8 - 23 mg/dL   Creatinine, Ser 0.94 0.44 - 1.00 mg/dL   Calcium 8.1 (L) 8.9 - 10.3 mg/dL   Total Protein 6.6 6.5 - 8.1 g/dL   Albumin 2.8 (L) 3.5 - 5.0 g/dL   AST 43 (H) 15 - 41 U/L   ALT 35 0 - 44 U/L   Alkaline  Phosphatase 53 38 - 126 U/L   Total Bilirubin 0.5 0.3 - 1.2 mg/dL   GFR,  Estimated >60 >60 mL/min    Comment: (NOTE) Calculated using the CKD-EPI Creatinine Equation (2021)    Anion gap 9 5 - 15    Comment: Performed at [facility], [address] [place], [place] [address]  CBC     Status: Abnormal   Collection Time: 11/03/21  5:22 AM  Result Value Ref Range   WBC 5.3 4.0 - 10.5 K/uL   RBC 4.69 3.87 - 5.11 MIL/uL   Hemoglobin 11.2 (L) 12.0 - 15.0 g/dL   HCT 37.3 36.0 - 46.0 %   MCV 79.5 (L) 80.0 - 100.0 fL   MCH 23.9 (L) 26.0 - 34.0 pg   MCHC 30.0 30.0 - 36.0 g/dL   RDW 19.2 (H) 11.5 - 15.5 %   Platelets 287 150 - 400 K/uL   nRBC 0.0 0.0 - 0.2 %    Comment: Performed at [facility], [address] [place], [place] [address]  Glucose, capillary     Status: Abnormal   Collection Time: 11/03/21  7:35 AM  Result Value Ref Range   Glucose-Capillary 120 (H) 70 - 99 mg/dL    Comment: Glucose reference range applies only to samples taken after fasting for at least 8 hours.  ECHOCARDIOGRAM COMPLETE     Status: None   Collection Time: 11/03/21  9:54 AM  Result Value Ref Range   Weight 2,416 oz   Height 60 in   BP 128/59 mmHg   S' Lateral 1.90 cm   Area-P 1/2 3.61 cm2  Glucose, capillary     Status: Abnormal   Collection Time: 11/03/21 11:58 AM  Result Value Ref Range   Glucose-Capillary 171 (H) 70 - 99 mg/dL    Comment: Glucose reference range applies only to samples taken after fasting for at least 8 hours.  Glucose, capillary     Status: Abnormal   Collection Time: 11/03/21  3:58 PM  Result Value Ref Range   Glucose-Capillary 151 (H) 70 - 99 mg/dL    Comment: Glucose reference range applies only to samples taken after fasting for at least 8 hours.  HM DIABETES FOOT EXAM     Status: None   Collection Time: 11/10/21 12:00 AM  Result Value Ref Range   HM Diabetic Foot Exam done     Assessment/Plan:  Temporal arteritis ([place]) The patient has symptoms  concerning for temporal arteritis we discussed the only way to reliably get a diagnosis is with a biopsy.  This will be done in the near future at her convenience.  Risks and benefits of the procedure were discussed.  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  We discussed the importance of compression socks and elevation.  No role for intervention with postphlebitic syndrome.  Hypertension associated with diabetes ([place]) blood pressure control important in reducing the progression of atherosclerotic disease. On appropriate oral medications.   Diabetes mellitus with no complication (HCC) blood glucose control important in reducing the progression of atherosclerotic disease. Also, involved in wound healing. On appropriate medications.      Leotis Pain 11/27/2021, 11:49 AM   This note was created with Dragon medical transcription system.  Any errors from dictation are unintentional.

## 2021-11-27 NOTE — Assessment & Plan Note (Signed)
The patient has symptoms concerning for temporal arteritis we discussed the only way to reliably get a diagnosis is with a biopsy.  This will be done in the near future at her convenience.  Risks and benefits of the procedure were discussed.

## 2021-11-30 ENCOUNTER — Encounter (INDEPENDENT_AMBULATORY_CARE_PROVIDER_SITE_OTHER): Payer: Self-pay | Admitting: Vascular Surgery

## 2021-11-30 MED ORDER — BUDESONIDE 3 MG PO CPEP: 6.0000 mg | ORAL_CAPSULE | Freq: Every day | ORAL | 2 refills | Status: DC

## 2021-12-01 ENCOUNTER — Encounter
Admission: RE | Admit: 2021-12-01 | Discharge: 2021-12-01 | Disposition: A | Discharge status: Home / Self Care | Payer: Medicare Other | Source: Ambulatory Visit | Attending: Vascular Surgery | Admitting: Vascular Surgery

## 2021-12-01 ENCOUNTER — Other Ambulatory Visit (INDEPENDENT_AMBULATORY_CARE_PROVIDER_SITE_OTHER): Payer: Self-pay | Admitting: Nurse Practitioner

## 2021-12-01 ENCOUNTER — Encounter: Payer: Medicare Other | Admitting: Physical Therapy

## 2021-12-01 ENCOUNTER — Telehealth (INDEPENDENT_AMBULATORY_CARE_PROVIDER_SITE_OTHER): Payer: Self-pay

## 2021-12-01 ENCOUNTER — Other Ambulatory Visit: Payer: Self-pay

## 2021-12-01 DIAGNOSIS — M316 Other giant cell arteritis: Secondary | ICD-10-CM

## 2021-12-01 HISTORY — DX: Dyspnea, unspecified: R06.00

## 2021-12-01 NOTE — Patient Instructions (Signed)
Your procedure is scheduled on: 12/03/21 Report to [place]. To find out your arrival time please call [phone] between 1PM - 3PM on 12/02/21.  Remember: Instructions that are not followed completely may result in serious medical risk, up to and including death, or upon the discretion of your surgeon and anesthesiologist your surgery may need to be rescheduled.     _X__ 1. Do not eat food or liquids after midnight the night before your procedure.                 No gum chewing or hard candies. (Will attempt contact with Dr Lucky Cowboy )  __X__2.  On the morning of surgery brush your teeth with toothpaste and water, you                 may rinse your mouth with mouthwash if you wish.  Do not swallow any              toothpaste of mouthwash.     _X__ 3.  No Alcohol for 24 hours before or after surgery.   _X__ 4.  Do Not Smoke or use e-cigarettes For 24 Hours Prior to Your Surgery.                 Do not use any chewable tobacco products for at least 6 hours prior to                 surgery.  ____  5.  Bring all medications with you on the day of surgery if instructed.   __X__  6.  Notify your doctor if there is any change in your medical condition      (cold, fever, infections).     Do not wear jewelry, make-up, hairpins, clips or nail polish. Do not wear lotions, powders, or perfumes.  Do not shave 48 hours prior to surgery. Men may shave face and neck. Do not bring valuables to the hospital.    [facility] is not responsible for any belongings or valuables.  Contacts, dentures/partials or body piercings may not be worn into surgery. Bring a case for your contacts, glasses or hearing aids, a denture cup will be supplied. Leave your suitcase in the car. After surgery it may be brought to your room. For patients admitted to the hospital, discharge time is determined by your treatment team.   Patients discharged the day of surgery  will not be allowed to drive home.   Please read over the following fact sheets that you were given:     __X__ Take these medicines the morning of surgery with A SIP OF WATER:    1.   2.   3.   4.  5.  6.  ____ Fleet Enema (as directed)   __X__ Use CHG Soap/SAGE wipes as directed  ____ Use inhalers on the day of surgery  ____ Stop metformin/Janumet/Farxiga 2 days prior to surgery    ____ Take 1/2 of usual insulin dose the night before surgery. No insulin the morning          of surgery.   ____ Stop Blood Thinners Coumadin/Plavix/Xarelto/Pleta/Pradaxa/Eliquis/Effient/Aspirin  on   Or contact your Surgeon, Cardiologist or Medical Doctor regarding  ability to stop your blood thinners  __X__ Stop Anti-inflammatories 7 days before surgery such as Advil, Ibuprofen, Motrin,  BC or Goodies Powder, Naprosyn, Naproxen, Aleve, Aspirin    __X__ Stop all herbal supplements, fish oil  or vitamin E until after surgery.    ____ Bring C-Pap to the hospital.

## 2021-12-03 ENCOUNTER — Ambulatory Visit: Payer: Medicare Other | Admitting: Urgent Care

## 2021-12-03 ENCOUNTER — Encounter
Admission: RE | Disposition: A | Discharge status: Home / Self Care | Payer: Self-pay | Source: Home / Self Care | Attending: Vascular Surgery

## 2021-12-03 ENCOUNTER — Ambulatory Visit
Admission: RE | Admit: 2021-12-03 | Discharge: 2021-12-03 | Disposition: A | Discharge status: Home / Self Care | Payer: Medicare Other | Attending: Vascular Surgery | Admitting: Vascular Surgery

## 2021-12-03 ENCOUNTER — Encounter: Payer: Self-pay | Admitting: Vascular Surgery

## 2021-12-03 ENCOUNTER — Other Ambulatory Visit: Payer: Self-pay

## 2021-12-03 DIAGNOSIS — M19021 Primary osteoarthritis, right elbow: Secondary | ICD-10-CM | POA: Diagnosis not present

## 2021-12-03 DIAGNOSIS — Z87891 Personal history of nicotine dependence: Secondary | ICD-10-CM | POA: Diagnosis not present

## 2021-12-03 DIAGNOSIS — K219 Gastro-esophageal reflux disease without esophagitis: Secondary | ICD-10-CM | POA: Insufficient documentation

## 2021-12-03 DIAGNOSIS — I87009 Postthrombotic syndrome without complications of unspecified extremity: Secondary | ICD-10-CM | POA: Insufficient documentation

## 2021-12-03 DIAGNOSIS — R700 Elevated erythrocyte sedimentation rate: Secondary | ICD-10-CM | POA: Diagnosis not present

## 2021-12-03 DIAGNOSIS — I10 Essential (primary) hypertension: Secondary | ICD-10-CM | POA: Diagnosis not present

## 2021-12-03 DIAGNOSIS — M316 Other giant cell arteritis: Secondary | ICD-10-CM

## 2021-12-03 DIAGNOSIS — G43909 Migraine, unspecified, not intractable, without status migrainosus: Secondary | ICD-10-CM | POA: Diagnosis not present

## 2021-12-03 DIAGNOSIS — Z86718 Personal history of other venous thrombosis and embolism: Secondary | ICD-10-CM | POA: Insufficient documentation

## 2021-12-03 DIAGNOSIS — Z794 Long term (current) use of insulin: Secondary | ICD-10-CM | POA: Insufficient documentation

## 2021-12-03 DIAGNOSIS — E785 Hyperlipidemia, unspecified: Secondary | ICD-10-CM | POA: Insufficient documentation

## 2021-12-03 DIAGNOSIS — M170 Bilateral primary osteoarthritis of knee: Secondary | ICD-10-CM | POA: Insufficient documentation

## 2021-12-03 DIAGNOSIS — E119 Type 2 diabetes mellitus without complications: Secondary | ICD-10-CM | POA: Diagnosis not present

## 2021-12-03 DIAGNOSIS — Z7901 Long term (current) use of anticoagulants: Secondary | ICD-10-CM | POA: Diagnosis not present

## 2021-12-03 DIAGNOSIS — K861 Other chronic pancreatitis: Secondary | ICD-10-CM | POA: Insufficient documentation

## 2021-12-03 DIAGNOSIS — I7789 Other specified disorders of arteries and arterioles: Secondary | ICD-10-CM

## 2021-12-03 DIAGNOSIS — R519 Headache, unspecified: Secondary | ICD-10-CM | POA: Diagnosis not present

## 2021-12-03 DIAGNOSIS — D649 Anemia, unspecified: Secondary | ICD-10-CM | POA: Diagnosis not present

## 2021-12-03 DIAGNOSIS — H539 Unspecified visual disturbance: Secondary | ICD-10-CM | POA: Diagnosis not present

## 2021-12-03 DIAGNOSIS — M1612 Unilateral primary osteoarthritis, left hip: Secondary | ICD-10-CM | POA: Insufficient documentation

## 2021-12-03 DIAGNOSIS — K449 Diaphragmatic hernia without obstruction or gangrene: Secondary | ICD-10-CM | POA: Insufficient documentation

## 2021-12-03 HISTORY — PX: ARTERY BIOPSY: SHX891

## 2021-12-03 LAB — ABO/RH: ABO/RH(D): A POS

## 2021-12-03 LAB — TYPE AND SCREEN
ABO/RH(D): A POS
Antibody Screen: NEGATIVE

## 2021-12-03 LAB — GLUCOSE, CAPILLARY
Glucose-Capillary: 103 mg/dL — ABNORMAL HIGH (ref 70–99)
Glucose-Capillary: 92 mg/dL (ref 70–99)

## 2021-12-03 SURGERY — BIOPSY TEMPORAL ARTERY
Anesthesia: General | Site: Head | Laterality: Right

## 2021-12-03 MED ORDER — SODIUM CHLORIDE 0.9 % IV SOLN: INTRAVENOUS | Status: DC

## 2021-12-03 MED ORDER — ORAL CARE MOUTH RINSE: 15.0000 mL | Freq: Once | OROMUCOSAL | Status: AC

## 2021-12-03 MED ORDER — PROPOFOL 10 MG/ML IV BOLUS
INTRAVENOUS | Status: AC
  Filled 2021-12-03: qty 20

## 2021-12-03 MED ORDER — PROPOFOL 500 MG/50ML IV EMUL
INTRAVENOUS | Status: DC | PRN
  Administered 2021-12-03: 80 ug/kg/min via INTRAVENOUS

## 2021-12-03 MED ORDER — LIDOCAINE HCL (CARDIAC) PF 100 MG/5ML IV SOSY
PREFILLED_SYRINGE | INTRAVENOUS | Status: DC | PRN
  Administered 2021-12-03: 60 mg via INTRAVENOUS

## 2021-12-03 MED ORDER — PROPOFOL 10 MG/ML IV BOLUS
INTRAVENOUS | Status: DC | PRN
  Administered 2021-12-03 (×2): 20 mg via INTRAVENOUS

## 2021-12-03 MED ORDER — VANCOMYCIN HCL IN DEXTROSE 1-5 GM/200ML-% IV SOLN
INTRAVENOUS | Status: AC
  Administered 2021-12-03: 1000 mg via INTRAVENOUS
  Filled 2021-12-03: qty 200

## 2021-12-03 MED ORDER — FENTANYL CITRATE (PF) 100 MCG/2ML IJ SOLN
INTRAMUSCULAR | Status: AC
  Filled 2021-12-03: qty 2

## 2021-12-03 MED ORDER — VANCOMYCIN HCL IN DEXTROSE 1-5 GM/200ML-% IV SOLN: 1000.0000 mg | INTRAVENOUS | Status: AC

## 2021-12-03 MED ORDER — LIDOCAINE HCL (PF) 1 % IJ SOLN
INTRAMUSCULAR | Status: DC | PRN
  Administered 2021-12-03: 7 mL

## 2021-12-03 MED ORDER — LIDOCAINE HCL (PF) 1 % IJ SOLN
INTRAMUSCULAR | Status: AC
  Filled 2021-12-03: qty 30

## 2021-12-03 MED ORDER — HYDROMORPHONE HCL 1 MG/ML IJ SOLN: 1.0000 mg | Freq: Once | INTRAMUSCULAR | Status: DC | PRN

## 2021-12-03 MED ORDER — PHENYLEPHRINE 80 MCG/ML (10ML) SYRINGE FOR IV PUSH (FOR BLOOD PRESSURE SUPPORT)
PREFILLED_SYRINGE | INTRAVENOUS | Status: AC
  Filled 2021-12-03: qty 10

## 2021-12-03 MED ORDER — 0.9 % SODIUM CHLORIDE (POUR BTL) OPTIME
TOPICAL | Status: DC | PRN
  Administered 2021-12-03: 500 mL

## 2021-12-03 MED ORDER — ONDANSETRON HCL 4 MG/2ML IJ SOLN
INTRAMUSCULAR | Status: DC | PRN
  Administered 2021-12-03: 4 mg via INTRAVENOUS

## 2021-12-03 MED ORDER — DROPERIDOL 2.5 MG/ML IJ SOLN: 0.6250 mg | Freq: Once | INTRAMUSCULAR | Status: DC | PRN

## 2021-12-03 MED ORDER — CHLORHEXIDINE GLUCONATE CLOTH 2 % EX PADS: 6.0000 | MEDICATED_PAD | Freq: Once | CUTANEOUS | Status: DC

## 2021-12-03 MED ORDER — MIDAZOLAM HCL 2 MG/2ML IJ SOLN
INTRAMUSCULAR | Status: AC
  Filled 2021-12-03: qty 2

## 2021-12-03 MED ORDER — CHLORHEXIDINE GLUCONATE 0.12 % MT SOLN: 15.0000 mL | Freq: Once | OROMUCOSAL | Status: AC

## 2021-12-03 MED ORDER — ACETAMINOPHEN 10 MG/ML IV SOLN: 1000.0000 mg | Freq: Once | INTRAVENOUS | Status: DC | PRN

## 2021-12-03 MED ORDER — CHLORHEXIDINE GLUCONATE 0.12 % MT SOLN
OROMUCOSAL | Status: AC
  Administered 2021-12-03: 15 mL via OROMUCOSAL
  Filled 2021-12-03: qty 15

## 2021-12-03 MED ORDER — ONDANSETRON HCL 4 MG/2ML IJ SOLN: 4.0000 mg | Freq: Four times a day (QID) | INTRAMUSCULAR | Status: DC | PRN

## 2021-12-03 MED ORDER — PHENYLEPHRINE 80 MCG/ML (10ML) SYRINGE FOR IV PUSH (FOR BLOOD PRESSURE SUPPORT)
PREFILLED_SYRINGE | INTRAVENOUS | Status: DC | PRN
  Administered 2021-12-03: 40 ug via INTRAVENOUS

## 2021-12-03 SURGICAL SUPPLY — 45 items
ADH SKN CLS APL DERMABOND .7 (GAUZE/BANDAGES/DRESSINGS) ×1
BLADE SURG 15 STRL LF DISP TIS (BLADE) ×1 IMPLANT
BLADE SURG 15 STRL SS (BLADE) ×2
BLADE SURG SZ11 CARB STEEL (BLADE) ×2 IMPLANT
CNTNR SPEC 2.5X3XGRAD LEK (MISCELLANEOUS)
CONT SPEC 4OZ STER OR WHT (MISCELLANEOUS)
CONT SPEC 4OZ STRL OR WHT (MISCELLANEOUS)
CONTAINER SPEC 2.5X3XGRAD LEK (MISCELLANEOUS) IMPLANT
COTTON BALL STRL MEDIUM (GAUZE/BANDAGES/DRESSINGS) ×2 IMPLANT
DERMABOND ADVANCED (GAUZE/BANDAGES/DRESSINGS) ×1
DERMABOND ADVANCED .7 DNX12 (GAUZE/BANDAGES/DRESSINGS) ×2 IMPLANT
DRAPE LAPAROTOMY 77X122 PED (DRAPES) ×3 IMPLANT
DRSG TELFA 4X3 1S NADH ST (GAUZE/BANDAGES/DRESSINGS) ×2 IMPLANT
ELECT CAUTERY BLADE 6.4 (BLADE) ×2 IMPLANT
ELECT REM PT RETURN 9FT ADLT (ELECTROSURGICAL) ×2
ELECTRODE REM PT RTRN 9FT ADLT (ELECTROSURGICAL) ×2 IMPLANT
GAUZE 4X4 16PLY ~~LOC~~+RFID DBL (SPONGE) ×3 IMPLANT
GLOVE BIO SURGEON STRL SZ7 (GLOVE) ×2 IMPLANT
GOWN STRL REUS W/ TWL LRG LVL3 (GOWN DISPOSABLE) ×1 IMPLANT
GOWN STRL REUS W/ TWL XL LVL3 (GOWN DISPOSABLE) ×1 IMPLANT
GOWN STRL REUS W/TWL LRG LVL3 (GOWN DISPOSABLE) ×2
GOWN STRL REUS W/TWL XL LVL3 (GOWN DISPOSABLE) ×2
KIT TURNOVER KIT A (KITS) ×2 IMPLANT
LABEL OR SOLS (LABEL) ×2 IMPLANT
MANIFOLD NEPTUNE II (INSTRUMENTS) ×2 IMPLANT
NDL HYPO 25X1 1.5 SAFETY (NEEDLE) IMPLANT
NEEDLE HYPO 25X1 1.5 SAFETY (NEEDLE) IMPLANT
NS IRRIG 500ML POUR BTL (IV SOLUTION) ×2 IMPLANT
PACK BASIN MINOR ARMC (MISCELLANEOUS) ×2 IMPLANT
SOL PREP PVP 2OZ (MISCELLANEOUS) ×2
SOLUTION PREP PVP 2OZ (MISCELLANEOUS) ×1 IMPLANT
SUCTION FRAZIER HANDLE 10FR (MISCELLANEOUS) ×2
SUCTION TUBE FRAZIER 10FR DISP (MISCELLANEOUS) ×1 IMPLANT
SUT MNCRL AB 4-0 PS2 18 (SUTURE) ×2 IMPLANT
SUT SILK 2 0 (SUTURE) ×2
SUT SILK 2-0 18XBRD TIE 12 (SUTURE) ×2 IMPLANT
SUT SILK 3 0 (SUTURE) ×2
SUT SILK 3-0 18XBRD TIE 12 (SUTURE) ×1 IMPLANT
SUT SILK 4 0 (SUTURE) ×2
SUT SILK 4-0 18XBRD TIE 12 (SUTURE) ×2 IMPLANT
SUT VIC AB 3-0 SH 27 (SUTURE) ×2
SUT VIC AB 3-0 SH 27X BRD (SUTURE) ×1 IMPLANT
SYR 10ML LL (SYRINGE) IMPLANT
SYR BULB IRRIG 60ML STRL (SYRINGE) ×2 IMPLANT
WATER STERILE IRR 500ML POUR (IV SOLUTION) ×2 IMPLANT

## 2021-12-03 NOTE — Transfer of Care (Signed)
Immediate Anesthesia Transfer of Care Note  Patient: Debra Leach  Procedure(s) Performed: BIOPSY TEMPORAL ARTERY (Right: Head)  Patient Location: PACU  Anesthesia Type:General  Level of Consciousness: drowsy  Airway & Oxygen Therapy: Patient Spontanous Breathing  Post-op Assessment: Report given to RN and Post -op Vital signs reviewed and stable  Post vital signs: Reviewed and stable  Last Vitals:  Vitals Value Taken Time  BP 121/61 12/03/21 1414  Temp    Pulse 73 12/03/21 1416  Resp 14 12/03/21 1416  SpO2 97 % 12/03/21 1416  Vitals shown include unvalidated device data.  Last Pain:  Vitals:   12/03/21 1120  TempSrc: Oral  PainSc: 0-No pain         Complications: No notable events documented.

## 2021-12-03 NOTE — Op Note (Signed)
        OPERATIVE NOTE   PRE-OPERATIVE DIAGNOSIS: suspected temporal arteritis, headaches and visual changes  POST-OPERATIVE DIAGNOSIS: Same as above  PROCEDURE: 1.   Right temporal artery biopsy  SURGEON: Leotis Pain, MD  ASSISTANT(S): none  ANESTHESIA: MAC  ESTIMATED BLOOD LOSS: Minimal  FINDING(S): 1.  none  SPECIMEN(S):  Right superficial temporal artery sent to pathology  INDICATIONS:   Patient is a 71 y.o. female who presents with visual changes and headaches with an elevated ESR. We were consulted for consideration for temporal artery biopsy. Risks and benefits were discussed and he was agreeable to proceed.  DESCRIPTION: After obtaining full informed written consent, the patient was brought back to the operating room and placed supine upon the operating table.  The patient received IV antibiotics prior to induction.  After obtaining adequate anesthesia, the patient was prepped and draped in the standard fashion. The area in front of his right ear was anesthetized copiously with a solution of 1% lidocaine and half percent Marcaine without epinephrine. I then made an incision just in front of the right ear overlying the palpable pulse. I then dissected down through the subcutaneous tissues and identified the superficial temporal artery. This was dissected out over a several centimeters and branches were ligated and divided between silk ties. Care was used to avoid electrocautery around the artery. I then clamped the artery proximally and distally and transected the artery. The specimen was then sent to pathology. The proximal and distal artery were ligated with 3-0 silk ties. Hemostasis was achieved. The wound was then closed with a series of interrupted 3-0 Vicryl's and the skin was closed with a 4-0 Monocryl. Sterile dressing was placed. The patient was taken to the recovery room in stable condition having tolerated the procedure well.  COMPLICATIONS: None  CONDITION:  Stable   Leotis Pain 12/03/2021 2:14 PM  This note was created with Dragon Medical transcription system. Any errors in dictation are purely unintentional.

## 2021-12-03 NOTE — Discharge Instructions (Signed)

## 2021-12-03 NOTE — Interval H&P Note (Signed)
History and Physical Interval Note:  12/03/2021 2:12 PM  Debra Leach  has presented today for surgery, with the diagnosis of TEMPORAL ARTERITIS.  The various methods of treatment have been discussed with the patient and family. After consideration of risks, benefits and other options for treatment, the patient has consented to  Procedure(s) with comments: BIOPSY TEMPORAL ARTERY (Right) - Right temple as a surgical intervention.  The patient's history has been reviewed, patient examined, no change in status, stable for surgery.  I have reviewed the patient's chart and labs.  Questions were answered to the patient's satisfaction.     Leotis Pain

## 2021-12-03 NOTE — Anesthesia Procedure Notes (Signed)
Procedure Name: MAC Date/Time: 12/03/2021 1:38 PM  Performed by: Tollie Eth, CRNAPre-anesthesia Checklist: Patient identified, Emergency Drugs available, Suction available and Patient being monitored Patient Re-evaluated:Patient Re-evaluated prior to induction Oxygen Delivery Method: Nasal cannula Induction Type: IV induction Placement Confirmation: positive ETCO2

## 2021-12-03 NOTE — Anesthesia Preprocedure Evaluation (Addendum)
Anesthesia Evaluation  Patient identified by MRN, date of birth, ID band Patient awake    Reviewed: Allergy & Precautions, NPO status , Patient's Chart, lab work & pertinent test results  History of Anesthesia Complications (+) PONV and history of anesthetic complications  Airway Mallampati: II  TM Distance: >3 FB Neck ROM: full    Dental no notable dental hx.    Pulmonary neg pulmonary ROS, former smoker,    Pulmonary exam normal        Cardiovascular hypertension, Normal cardiovascular exam+ Valvular Problems/Murmurs MVP   DVT (deep venous thrombosis) 2020   Neuro/Psych negative psych ROS   GI/Hepatic Neg liver ROS, hiatal hernia (small), GERD  Medicated and Controlled,Autoimmune pancreatitis   Endo/Other  diabetes, Poorly Controlled, Insulin Dependenton chronic steroids  Renal/GU Renal InsufficiencyRenal disease  negative genitourinary   Musculoskeletal  (+) Arthritis ,   Abdominal   Peds  Hematology  (+) Blood dyscrasia, anemia ,   Anesthesia Other Findings TEMPORAL ARTERITIS- right eye visual changes   flu on 10/30/21  CT angio performed 10/2021 to evaluate for syncopal episode with recent DVT. No PE noted.   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[place]) No date: Clotting disorder ([place]) No date: Complication of anesthesia     Comment:  difficult waking No date: Diabetes mellitus without complication (HCC)     Comment:  Diet and exercise controlled 02/2019: DVT (deep venous thrombosis) (HCC)     Comment:  3calf 2 right lung 1 left lung No date: Dyspnea No date: GERD (gastroesophageal reflux disease) No date: Headache No date: History of kidney stones No date: Hyperlipidemia No date: Hypertension No date: MVP (mitral valve prolapse)     Comment:  history of No date: PONV (postoperative  nausea and vomiting)  Past Surgical History: No date: arm surgery     Comment:  left forearm fracture w/ plates pins screws No date: BREAST BIOPSY; Left 2000: BREAST SURGERY     Comment:  breast biopsy (benign) 08/12/2008: LITHOTRIPSY     Comment:  stent placed bilaterally No date: PARTIAL HYSTERECTOMY     Comment:  Both ovaries remain, vaginal, for mennorhagia No date: TONSILLECTOMY 1980: TUBAL LIGATION     Reproductive/Obstetrics negative OB ROS                            Anesthesia Physical Anesthesia Plan  ASA: 3  Anesthesia Plan: General   Post-op Pain Management: Regional block* and Minimal or no pain anticipated   Induction: Intravenous  PONV Risk Score and Plan: Propofol infusion and TIVA  Airway Management Planned: Natural Airway and Nasal Cannula  Additional Equipment:   Intra-op Plan:   Post-operative Plan:   Informed Consent: I have reviewed the patients History and Physical, chart, labs and discussed the procedure including the risks, benefits and alternatives for the proposed anesthesia with the patient or authorized representative who has indicated his/her understanding and acceptance.     Dental Advisory Given  Plan Discussed with: Anesthesiologist, CRNA and Surgeon  Anesthesia Plan Comments:        Anesthesia Quick Evaluation

## 2021-12-04 NOTE — Anesthesia Postprocedure Evaluation (Signed)
Anesthesia Post Note  Patient: Debra Leach  Procedure(s) Performed: BIOPSY TEMPORAL ARTERY (Right: Head)  Patient location during evaluation: PACU Anesthesia Type: General Level of consciousness: awake and alert Pain management: pain level controlled Vital Signs Assessment: post-procedure vital signs reviewed and stable Respiratory status: spontaneous breathing, nonlabored ventilation and respiratory function stable Cardiovascular status: blood pressure returned to baseline and stable Postop Assessment: no apparent nausea or vomiting Anesthetic complications: no   No notable events documented.   Last Vitals:  Vitals:   12/03/21 1450 12/03/21 1453  BP:  (!) 157/67  Pulse: 76   Resp: 18   Temp: 36.6 C   SpO2: 100%     Last Pain:  Vitals:   12/04/21 0819  TempSrc:   PainSc: 0-No pain                 [place] Ouch

## 2021-12-07 LAB — SURGICAL PATHOLOGY

## 2021-12-09 NOTE — Telephone Encounter (Signed)
Gabe thanks for your thoughtful comments. Appreciate you accepting her if she wishes to be seen at [facility] for her complex pancreatic disease.  Janett Billow can you please let the patient know and relay Dr. Donneta Romberg recommendations? Thanks

## 2021-12-09 NOTE — Telephone Encounter (Signed)
Patient's extensive chart was placed on my desk.  Now that I have returned to the office I had a chance to briefly review.  Patient has history of previously diagnosed autoimmune pancreatitis after presenting with pancreatic masslike area.  She has been on prednisone in the past as well as what looks to be chronic budesonide therapy.  Had recent MRI/MRCP this year which does not show any evidence of a pancreatic mass but shows significant pancreatic atrophy.  Patient last seen by her gastroenterologist, Dr. Earlean Shawl in May of this year. Patient wanted to be referred to Dr. Havery Moros but due to complexity of patient's pancreas disease was felt that she may benefit from being evaluated by myself or Dr. Ardis Hughs.  I have reviewed the patient's laboratories as well and she had a recent hospitalization for influenza A and had some recent blood work that also showed evidence of iron deficiency with anemia. Here my thoughts: 1) if patient wants to transition care to [place] I am happy to be available if she decides 2) we need to obtain the most recent EGD and colonoscopy reports and pathology that she has had because if she has a true iron deficiency that is persisting she may need updated EGD/colonoscopy and potential video capsule endoscopy to be considered 3) if she wants to be seen at [facility] with Dr. Jerene Pitch, who previously diagnosed her with autoimmune pancreatitis that may not be unreasonable as well 4) until she makes a decision or until she is scheduled and seen by me, she will need to direct any questions to [facility]  I will have the patient's records available for review at time of clinic consultation if she chooses to see [place] here  Justice Britain, MD [facility] Gastroenterology Advanced Endoscopy Office # [phone]

## 2021-12-10 ENCOUNTER — Encounter: Payer: Self-pay | Admitting: Family Medicine

## 2021-12-10 LAB — HM DIABETES EYE EXAM

## 2021-12-10 NOTE — Telephone Encounter (Addendum)
Spoke with patient and she confirmed her last EGD\Colon were done in 2022 both report findings were normal. She said she did recent labs and MRI test done, available in Epic.She also expressed how she would really like for Dr. Havery Moros to be her main GI physician but if she needs to be seen by you for any additional GI care she would agree.   Please advise.

## 2021-12-10 NOTE — Telephone Encounter (Signed)
Called patient to advise and request the procedure reports needed. Left voicemail.

## 2021-12-11 ENCOUNTER — Encounter: Payer: Self-pay | Admitting: Family Medicine

## 2021-12-11 ENCOUNTER — Ambulatory Visit (INDEPENDENT_AMBULATORY_CARE_PROVIDER_SITE_OTHER): Payer: Medicare Other | Admitting: Family Medicine

## 2021-12-11 ENCOUNTER — Ambulatory Visit (INDEPENDENT_AMBULATORY_CARE_PROVIDER_SITE_OTHER)
Admission: RE | Admit: 2021-12-11 | Discharge: 2021-12-11 | Disposition: A | Discharge status: Home / Self Care | Payer: Medicare Other | Source: Ambulatory Visit | Attending: Family Medicine | Admitting: Family Medicine

## 2021-12-11 VITALS — BP 150/82 | HR 76 | Temp 99.1°F | Ht 61.0 in | Wt 151.0 lb

## 2021-12-11 DIAGNOSIS — M546 Pain in thoracic spine: Secondary | ICD-10-CM | POA: Diagnosis not present

## 2021-12-11 DIAGNOSIS — H539 Unspecified visual disturbance: Secondary | ICD-10-CM

## 2021-12-11 DIAGNOSIS — R9389 Abnormal findings on diagnostic imaging of other specified body structures: Secondary | ICD-10-CM

## 2021-12-11 NOTE — Telephone Encounter (Signed)
Can you please let the patient know If she is establishing with our practice and her main chronic problem is autoimmune pancreatitis, given her course to date and what she has needed in the past I think she would be best served by establishing with Dr. Rush Landmark, our pancreas expert. I don't think it makes sense for her to see multiple GI providers in our practice, especially if he is willing to see her.  Hopefully she is understanding of this. Gabe thanks for your help with this patient.

## 2021-12-11 NOTE — Progress Notes (Signed)
Patient ID: Debra Leach, female    DOB: September 04, 1950, 71 y.o.   MRN: [id number]  This visit was conducted in person.  BP (!) 150/82   Pulse 76   Temp 99.1 F (37.3 C) (Oral)   Ht '5\' 1"'  (1.549 m)   Wt 151 lb (68.5 kg)   SpO2 98%   BMI 28.53 kg/m    CC:  Chief Complaint  Patient presents with   Follow-up    Back/Lung pain after having the Flu    Subjective:   HPI: Debra Leach is a 71 y.o. female presenting on 12/11/2021 for Follow-up (Back/Lung pain after having the Flu)  Recent temporal artery bx returned negative for temporal arteritis. She does continue to have intermittent visual changes in the right eye, for episodes so far.  History of migraine, she has not spoken to her neurologist about this issue at this point.  She did have hospitalization May 29 through May 30 for influenza A resulting in syncope hypokalemia, acute kidney injury.  CXR at that time showed:  IMPRESSION: Hazy right basilar opacity, likely subsegmental atelectasis. Left lung is clear.  At that time she has continued to have back pain left greater than right. She notes stabbing pain when she tries to get into bed, turn over, get out of bed, stand in a chair or bend over and pick something.  We tried a trial of cyclobenzaprine, muscle relaxant which did not help with her symptoms.    She  reports pain is improving overall but remains 6-7/10 stabbing pain.   Previously had band on pain across mid back., constant... none of this in last 24 hours.  Stable SOB... at baseline. HX of PE.  No radiation of pain   Relevant past medical, surgical, family and social history reviewed and updated as indicated. Interim medical history since our last visit reviewed. Allergies and medications reviewed and updated. Outpatient Medications Prior to Visit  Medication Sig Dispense Refill   atorvastatin (LIPITOR) 80 MG tablet Take 1 tablet (80 mg total) by mouth at bedtime.     Blood Glucose  Monitoring Suppl (ONETOUCH VERIO REFLECT) w/Device KIT 2 (two) times daily. for testing     budesonide (ENTOCORT EC) 3 MG 24 hr capsule Take 2 capsules (6 mg total) by mouth daily. 60 capsule 2   Cholecalciferol (VITAMIN D3) 1.25 MG (50000 UT) CAPS Take 1 capsule by mouth once a week. 12 capsule 3   colesevelam (WELCHOL) 625 MG tablet TAKE 3 TABLETS BY MOUTH 2 TIMES DAILY WITH A MEAL. 540 tablet 3   ELIQUIS 2.5 MG TABS tablet TAKE 1 TABLET BY MOUTH 2 TIMES DAILY. 180 tablet 0   Eptinezumab-jjmr (VYEPTI) 100 MG/ML injection Inject 300 mg into the vein every 3 (three) months.     gabapentin (NEURONTIN) 100 MG capsule TAKE 1 CAPSULE BY MOUTH UP TO 3 TIMES DAILY AS NEEDED. 270 capsule 3   gabapentin (NEURONTIN) 300 MG capsule TAKE 2 CAPSULES BY MOUTH AT BEDTIME. 180 capsule 0   glucose blood (ONETOUCH VERIO) test strip USE TO CHECK BLOOD SUGAR TWO TIMES A DAY 200 strip 3   insulin glargine (LANTUS SOLOSTAR) 100 UNIT/ML Solostar Pen Inject 40 Units into the skin daily. 45 mL 3   Insulin Pen Needle (ULTICARE SHORT PEN NEEDLES) 31G X 8 MM MISC USE TO INJECT INSULIN DAILY 100 each 3   Lancets (ONETOUCH DELICA PLUS KYHCWC37S) MISC USE TO CHECK BLOOD SUGAR TWO TIMES DAILY 200 each 3  omeprazole (PRILOSEC) 20 MG capsule Take 20 mg by mouth. before breakfast and bedtime     ondansetron (ZOFRAN) 4 MG tablet Take 1 tablet (4 mg total) by mouth every 8 (eight) hours as needed for nausea or vomiting. 20 tablet 0   potassium chloride SA (KLOR-CON M) 20 MEQ tablet TAKE 1 TABLET BY MOUTH DAILY. 90 tablet 1   repaglinide (PRANDIN) 0.5 MG tablet Take 0.5 mg by mouth. before breakfast and lunch     UBRELVY 100 MG TABS TAKE 1 TABLET BY MOUTH AS NEEDED (MAY REPEAT DOSE AFTER 2 HOURS IF NEEDED. MAXIMUM 2 TABLETS IN 24 HOURS). 10 tablet 3   vitamin B-12 (CYANOCOBALAMIN) 1000 MCG tablet Take 1 tablet by mouth daily.     cyclobenzaprine (FLEXERIL) 10 MG tablet Take 0.5-1 tablets (5-10 mg total) by mouth 3 (three) times daily  as needed for muscle spasms. (Patient not taking: Reported on 11/27/2021) 15 tablet 0   omeprazole (PRILOSEC) 20 MG capsule Take 20 mg by mouth.     repaglinide (PRANDIN) 0.5 MG tablet Take 1 tablet (0.5 mg total) by mouth 2 (two) times daily. (Patient taking differently: Take 0.5 mg by mouth 2 (two) times daily before a meal. before breakfast and lunch) 180 tablet 3   No facility-administered medications prior to visit.     Per HPI unless specifically indicated in ROS section below Review of Systems  Constitutional:  Negative for fatigue and fever.  HENT:  Negative for congestion.   Eyes:  Positive for visual disturbance. Negative for pain.  Respiratory:  Negative for cough and shortness of breath.   Cardiovascular:  Negative for chest pain, palpitations and leg swelling.  Gastrointestinal:  Negative for abdominal pain.  Genitourinary:  Negative for dysuria and vaginal bleeding.  Musculoskeletal:  Negative for back pain.  Neurological:  Negative for syncope, light-headedness and headaches.  Psychiatric/Behavioral:  Negative for dysphoric mood.    Objective:  BP (!) 150/82   Pulse 76   Temp 99.1 F (37.3 C) (Oral)   Ht '5\' 1"'  (1.549 m)   Wt 151 lb (68.5 kg)   SpO2 98%   BMI 28.53 kg/m   Wt Readings from Last 3 Encounters:  12/11/21 151 lb (68.5 kg)  12/03/21 146 lb (66.2 kg)  12/01/21 146 lb 6.4 oz (66.4 kg)      Physical Exam Constitutional:      General: She is not in acute distress.    Appearance: Normal appearance. She is well-developed. She is not ill-appearing or toxic-appearing.  HENT:     Head: Normocephalic.     Right Ear: Hearing, tympanic membrane, ear canal and external ear normal. Tympanic membrane is not erythematous, retracted or bulging.     Left Ear: Hearing, tympanic membrane, ear canal and external ear normal. Tympanic membrane is not erythematous, retracted or bulging.     Nose: No mucosal edema or rhinorrhea.     Right Sinus: No maxillary sinus  tenderness or frontal sinus tenderness.     Left Sinus: No maxillary sinus tenderness or frontal sinus tenderness.     Mouth/Throat:     Pharynx: Uvula midline.  Eyes:     General: Lids are normal. Lids are everted, no foreign bodies appreciated.     Conjunctiva/sclera: Conjunctivae normal.     Pupils: Pupils are equal, round, and reactive to light.  Neck:     Thyroid: No thyroid mass or thyromegaly.     Vascular: No carotid bruit.     Trachea: Trachea  normal.  Cardiovascular:     Rate and Rhythm: Normal rate and regular rhythm.     Pulses: Normal pulses.     Heart sounds: Normal heart sounds, S1 normal and S2 normal. No murmur heard.    No friction rub. No gallop.  Pulmonary:     Effort: Pulmonary effort is normal. No tachypnea or respiratory distress.     Breath sounds: Normal breath sounds. No decreased breath sounds, wheezing, rhonchi or rales.  Abdominal:     General: Bowel sounds are normal.     Palpations: Abdomen is soft.     Tenderness: There is no abdominal tenderness.  Musculoskeletal:     Cervical back: Normal range of motion and neck supple.  Skin:    General: Skin is warm and dry.     Findings: No rash.  Neurological:     Mental Status: She is alert.  Psychiatric:        Mood and Affect: Mood is not anxious or depressed.        Speech: Speech normal.        Behavior: Behavior normal. Behavior is cooperative.        Thought Content: Thought content normal.        Judgment: Judgment normal.       Results for orders placed or performed in visit on 12/10/21  HM DIABETES EYE EXAM  Result Value Ref Range   HM Diabetic Eye Exam No Retinopathy No Retinopathy     COVID 19 screen:  No recent travel or known exposure to [place] The patient denies respiratory symptoms of COVID 19 at this time. The importance of social distancing was discussed today.   Assessment and Plan    Problem List Items Addressed This Visit     Abnormal CXR    Given hazy opacity as  well as right-sided thoracic pain, persistent we will repeat a chest x-ray today to evaluate for resolution of changes in the chest x-ray.  Of note she has no shortness of breath or cough at this point.  She does have a history of pulmonary embolus.      Relevant Orders   DG Chest 2 View   Acute right-sided thoracic back pain - Primary    Acute, improving, followed influenza infection Her description of the pain sounds most like musculoskeletal pain versus pleurisy.  She is currently on budesonide for pancreatitis.      Relevant Orders   DG Chest 2 View   Vision changes    Acute, recurrent  She had an elevated sed rate and right visual changes concerning for temporal arteritis.  Recent biopsy done by Dr. Lucky Cowboy was negative.  We discussed today that this does not completely rule out temporal arteritis, but I feel she should discuss her symptoms with her neurologist to determine if it could be an atypical symptom of migraine or if other neurologic work-up is needed.  She will continue the budesonide that she is on for her GI issues.      Orders Placed This Encounter  Procedures   DG Chest 2 View    Standing Status:   Future    Number of Occurrences:   1    Standing Expiration Date:   12/12/2022    Order Specific Question:   Reason for Exam (SYMPTOM  OR DIAGNOSIS REQUIRED)    Answer:   follow up abnormal CXR , hazy opacity seen 11/15/2021 and pain in right thoracic spine    Order Specific Question:  Preferred imaging location?    Answer:   Barbie Haggis, MD

## 2021-12-11 NOTE — Patient Instructions (Addendum)
We will call with CXR results. Make sure to follow-up with your neurologist regarding visual changes.

## 2021-12-11 NOTE — Assessment & Plan Note (Addendum)
Acute, improving, followed influenza infection Her description of the pain sounds most like musculoskeletal pain versus pleurisy.  She is currently on budesonide for pancreatitis.

## 2021-12-11 NOTE — Assessment & Plan Note (Signed)
Given hazy opacity as well as right-sided thoracic pain, persistent we will repeat a chest x-ray today to evaluate for resolution of changes in the chest x-ray.  Of note she has no shortness of breath or cough at this point.  She does have a history of pulmonary embolus.

## 2021-12-11 NOTE — Telephone Encounter (Signed)
Final decision is up to Dr. Havery Moros. Thanks. GM

## 2021-12-11 NOTE — Assessment & Plan Note (Signed)
Acute, recurrent  She had an elevated sed rate and right visual changes concerning for temporal arteritis.  Recent biopsy done by Dr. Lucky Cowboy was negative.  We discussed today that this does not completely rule out temporal arteritis, but I feel she should discuss her symptoms with her neurologist to determine if it could be an atypical symptom of migraine or if other neurologic work-up is needed.  She will continue the budesonide that she is on for her GI issues.

## 2021-12-15 NOTE — Progress Notes (Unsigned)
NEUROLOGY FOLLOW UP OFFICE NOTE  Debra Leach [id number]  Assessment/Plan:   Chronic migraine without aura, without status migrainosus, not intractable - increased frequency and severity secondary to COVID Benign paroxysmal positional vertigo - increased frequency secondary to long-haul COVID Elevated blood pressure - follow up with PCP   Migraine prevention:  Continue Vyepti 38m Q12wks.  If no improvement in 2 to 4 weeks, plan to increase gabapentin to 3045min AM and 60039mt bedtime Migraine rescue:  Ubrelvy 100m3mabapentin 100mg21m less severe For dizziness, she will contact her PT to restart vestibular rehab.  May use meclizine for severe attacks but use sparingly.   Limit use of pain relievers to no more than 2 days out of week to prevent risk of rebound or medication-overuse headache. Keep headache diary Follow up 6 months.     Subjective:  Debra Leach 70 ye5 old right-handed female with autoimmune pancreatitis, migraines, hyperlipidemia, diabetes, GERD, osteoarthritis, and history of MVP and renal stones who follows up for migraines as well as new concern, dizziness.  ENT note reviewed.   UPDATE: Admitted to hospital on 11/02/2021 for syncope in setting of AKI and the flu.  ***.  Sed rate in May was over 130.  ***  Repeat Sed rate on 11/02/2021 was 60 with CRP 0.6.  Underwent temporal artery biopsy on 6/29 which was negative.     Migraine: From August to October - 27 headache-free days 16 days 2/10 lasting 3 hours 28 days 3/10 lasting 5 hours 19 days 4/10 lasting 4 hours 2 days 5/10 lasting 5 hours   [place] 13 headache-free days 22 days 2/10 lasting 3 hours 31 days 3/10 lasting 3 hours 25 days 4/10 lasting 3 hours 12 days 5/10 lasting 5 hours 1 day 6/10 lasting 4 hours 2 days 7/10 lasting 11 to 18 hours   Received Vyepti a week ago.  Usually takes a couple of weeks to kick in.   Rescue  protocol:  For moderate - gabapentin 100mg,9m severe- UbreRoselyn Meiernt NSAIDS/steroid: budesomide Current analgesics: None Current triptans: None Current ergotamine: None Current anti-emetic: Promethazine 12.5 mg Current muscle relaxants: None Current anti-anxiolytic: None Current sleep aide: None Current Antihypertensive medications: losartan-HCTZ Current Antidepressant medications: None Current Anticonvulsant medications: Gabapentin 600mg a24mdtime Current anti-CGRP: Vyepti 300mg; U42mvy Current Vitamins/Herbal/Supplements: Riboflavin 400 mg daily, vitamin D Current Antihistamines/Decongestants: meclizine Other therapy: Cefaly, ice pack Other medication: Meclizine   Caffeine: 1 cup of coffee daily Alcohol: None Smoker: No Diet: Follows LEAP ImmunoCalm Dietary Program.  Hydrates. Exercise: When tolerated (knee problems).  She walks and bikes. Depression: No; Anxiety: No Other pain:  no Sleep hygiene: Improved.  Sleeps better now than every before. 100mg dur61mday as needed   HISTORY: Migraines: Onset: 19 years 39d Location:  Varies (unilateral either side, frontal-temporal, back of head) Quality:  Varies (stabbing, pounding, throbbing) Initial Intensity:  10/10 severe, otherwise 5-7/10 Aura:  Occurs off and on over the years and varies in semiology.  In early 1970s, black out.  In late 1980s, white out.  In late 1990s, flashing lights and zigzag lines.  Since 2016, scintillating scotoma (occurs 1 to 2 times a month) Prodrome:  no Associated symptoms: Initially vomiting.  Sometimes nausea.  Photophobia, phonophobia.  Dizziness. Initial Duration:  All day Initial Frequency:  daily Triggers: Change in weather, emotional stress, valsalva maneuver, odors, certain foods Relieving factors: ice, rest Activity:  Difficult to function if severe  Past NSAIDS:  diclofenac 13m, Cambia, Mobic 164m ibuprofen, naproxen, toradol Past analgesics:  tramadol (reaction), Midrin,  Excedrin Past abortive triptans/ergots:  Treximet, Axert, Amerge, Frova, Maxalt, Relpax, Zomig tablet, DHE NS, sumatriptan 10079msumatriptan 6mg80m Past anti-emetic:  Zofran 4mg 44mt anxiolytic:  Buspirone, clonazepam Past antihypertensive medications:  Metoprolol, Norvasc, propranolol ER 120mg,70mnolol 100mg (29m effects dizziness, myalgias, acid reflux) Past antidepressant medications:  Venlafaxine, sertraline, amitriptyline, amoxapine, duloxetine, nortriptyline, imipramine, Luvox, Remeron, Serzone, bupropion, desipramine, doxepin, Vivactil Past anticonvulsant medications:  Depakote, Topamax, zonisamide, Lamictal, Keppra, Lyrica, gabapentin 300/300/600  Past anti--CGRP: Aimovig, Emgality Past vitamins/Herbal/Supplements:  butterbur, Mg, CoQ10 Other past treatments:  Botox, Cefaly Device, methylergonovine, acupuncture, biofeedback, Seroquel, Thorazine, methylsergide maleate   Family history of headache:  Maybe her mother's side.   Prior brain MRI normal (date unknown but report is mentioned in prior notes).   DIzziness: Reports dizziness since the beginning of the year.  No preceding head injury or new medication.  Reports episodic dizziness described as a wave of lightheadedness or sensation of stepping off of a boat, but not spinning.  It occurs with any movement but also can occur spontaneously.  Typically lasts 15 seconds but occurs all day long.  It doesn't occur while laying in bed, such as turning over.  No headache but feels like her head is floating.  No visual disturbance.  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  It does not appear to correlate with a migraine flare.  She reports history of mild orthostatic hypotension when getting up too fast, but not experiencing that with this and reportedly orthostatic vitals were negative when checked by her PCP.  Flonase didn't help, however meclizine is  effective.  She followed up with ENT.  Dix-Hallpike was negative.  VNG showed abnormal smooth pursuit, saccades and optokinetic testing - central pathology cannot be ruled out.  She has bilateral sensorineural hearing loss.  Hearing aids were recommended.  Underwent workup for possible central dizziness.  MRI and MRA of brain on 12/23/2020 were unremarkable.  Vestibular rehab effective.   Abnormal Movements: In December 2017, we started atenolol for migraine prevention.  It worked well, but due to reported side effects of dizziness, acid reflux and joint and muscle pain, she was tapered off of it in early April.  Around that time, she developed shaking episodes or tremors associated with her migraines.  She followed up with her PCP, Dr. BedsoleDiona Browner4/18 and we had her discontinue the gabapentin.  She hasn't had any recurrent tremors, however she says she has gone up to 4 days at a time without an attack.  Since discontinuing the gabapentin, the intensity of her daily headaches have gotten worse.     I reviewed the video of her habitual attacks.  In the video, she exhibits flapping of her right hand, arrhythmic and without tremor or choreiform movement.  It is not an arrhythmic jerking, consistent with myoclonus.  It sometimes involves the left hand as well.  She denies stress and anxiety.  PAST MEDICAL HISTORY: 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HCC)    Clotting disorder (HCC)    Complication of anesthesia    difficult waking   Diabetes mellitus without complication (HCC)    Diet and exercise controlled   DVT (deep venous thrombosis) (  [place]) 02/2019   3calf 2 right lung 1 left lung   DVT (deep venous thrombosis) ([place]) 2020   right leg   Dyspnea    GERD (gastroesophageal reflux disease)    Headache    History of kidney stones    Hyperlipidemia    Hypertension    MVP (mitral valve prolapse)     history of   PONV (postoperative nausea and vomiting)    Pulmonary emboli ([place]) 03/01/2019   2 in the right; 1 in the left    MEDICATIONS: Current Outpatient Medications on File Prior to Visit  Medication Sig Dispense Refill   atorvastatin (LIPITOR) 80 MG tablet Take 1 tablet (80 mg total) by mouth at bedtime.     Blood Glucose Monitoring Suppl (ONETOUCH VERIO REFLECT) w/Device KIT 2 (two) times daily. for testing     budesonide (ENTOCORT EC) 3 MG 24 hr capsule Take 2 capsules (6 mg total) by mouth daily. 60 capsule 2   Cholecalciferol (VITAMIN D3) 1.25 MG (50000 UT) CAPS Take 1 capsule by mouth once a week. 12 capsule 3   colesevelam (WELCHOL) 625 MG tablet TAKE 3 TABLETS BY MOUTH 2 TIMES DAILY WITH A MEAL. 540 tablet 3   ELIQUIS 2.5 MG TABS tablet TAKE 1 TABLET BY MOUTH 2 TIMES DAILY. 180 tablet 0   Eptinezumab-jjmr (VYEPTI) 100 MG/ML injection Inject 300 mg into the vein every 3 (three) months.     gabapentin (NEURONTIN) 100 MG capsule TAKE 1 CAPSULE BY MOUTH UP TO 3 TIMES DAILY AS NEEDED. 270 capsule 3   gabapentin (NEURONTIN) 300 MG capsule TAKE 2 CAPSULES BY MOUTH AT BEDTIME. 180 capsule 0   glucose blood (ONETOUCH VERIO) test strip USE TO CHECK BLOOD SUGAR TWO TIMES A DAY 200 strip 3   insulin glargine (LANTUS SOLOSTAR) 100 UNIT/ML Solostar Pen Inject 40 Units into the skin daily. 45 mL 3   Insulin Pen Needle (ULTICARE SHORT PEN NEEDLES) 31G X 8 MM MISC USE TO INJECT INSULIN DAILY 100 each 3   Lancets (ONETOUCH DELICA PLUS [id number]) MISC USE TO CHECK BLOOD SUGAR TWO TIMES DAILY 200 each 3   omeprazole (PRILOSEC) 20 MG capsule Take 20 mg by mouth. before breakfast and bedtime     ondansetron (ZOFRAN) 4 MG tablet Take 1 tablet (4 mg total) by mouth every 8 (eight) hours as needed for nausea or vomiting. 20 tablet 0   potassium chloride SA (KLOR-CON M) 20 MEQ tablet TAKE 1 TABLET BY MOUTH DAILY. 90 tablet 1   repaglinide (PRANDIN) 0.5 MG tablet Take 0.5 mg by mouth. before breakfast  and lunch     UBRELVY 100 MG TABS TAKE 1 TABLET BY MOUTH AS NEEDED (MAY REPEAT DOSE AFTER 2 HOURS IF NEEDED. MAXIMUM 2 TABLETS IN 24 HOURS). 10 tablet 3   vitamin B-12 (CYANOCOBALAMIN) 1000 MCG tablet Take 1 tablet by mouth daily.     No current facility-administered medications on file prior to visit.    ALLERGIES: Allergies  Allergen Reactions   Sudafed [Pseudoephedrine Hcl] Hives, Itching and Anxiety   Atenolol    Codeine     REACTION: Migraine   Epinephrine Hives   Ibuprofen    Jardiance [Empagliflozin]     Caused uti   Penicillins Itching    Has patient had a PCN reaction causing immediate rash, facial/tongue/throat swelling, SOB or lightheadedness with hypotension: No Has patient had a PCN reaction causing severe rash involving mucus membranes or skin necrosis: No Has patient had a PCN reaction  that required hospitalization: No Has patient had a PCN reaction occurring within the last 10 years: No If all of the above answers are "NO", then may proceed with Cephalosporin use.    Pentazocine Lactate     REACTION: Swelling, itching, rash   Promethazine Other (See Comments)    migraine   Propranolol Other (See Comments)   Talwin [Pentazocine] Other (See Comments)    Swelling and itching    Crestor [Rosuvastatin] Other (See Comments)    FAMILY HISTORY: Family History  Problem Relation Age of Onset   Cancer Mother        bone   Hypertension Father    Mitral valve prolapse Father    Asthma Brother    Arthritis Brother    Nephrolithiasis Brother    Nephrolithiasis Brother    Asthma Brother    Arthritis Brother    Aortic aneurysm Brother        ascending aortic aneuysm   Breast cancer Maternal Aunt    Breast cancer Maternal Aunt       Objective:  *** General: No acute distress.  Patient appears well-groomed.   Head:  Normocephalic/atraumatic Eyes:  Fundi examined but not visualized Neck: supple, no paraspinal tenderness, full range of motion Heart:  Regular  rate and rhythm Lungs:  Clear to auscultation bilaterally Back: No paraspinal tenderness Neurological Exam: alert and oriented to person, place, and time.  Speech fluent and not dysarthric, language intact.  CN II-XII intact. Bulk and tone normal, muscle strength 5/5 throughout.  Sensation to light touch intact.  Deep tendon reflexes 2+ throughout, toes downgoing.  Finger to nose testing intact.  Gait normal, Romberg negative.   Metta Clines, DO  CC: Eliezer Lofts, MD

## 2021-12-16 ENCOUNTER — Other Ambulatory Visit (HOSPITAL_COMMUNITY): Payer: Self-pay

## 2021-12-16 ENCOUNTER — Encounter: Payer: Self-pay | Admitting: Neurology

## 2021-12-16 ENCOUNTER — Ambulatory Visit (INDEPENDENT_AMBULATORY_CARE_PROVIDER_SITE_OTHER): Payer: Medicare Other | Admitting: Neurology

## 2021-12-16 ENCOUNTER — Telehealth: Payer: Self-pay

## 2021-12-16 VITALS — BP 166/83 | HR 85 | Ht 60.5 in | Wt 151.0 lb

## 2021-12-16 DIAGNOSIS — G43709 Chronic migraine without aura, not intractable, without status migrainosus: Secondary | ICD-10-CM | POA: Diagnosis not present

## 2021-12-16 DIAGNOSIS — G43109 Migraine with aura, not intractable, without status migrainosus: Secondary | ICD-10-CM | POA: Diagnosis not present

## 2021-12-16 DIAGNOSIS — R030 Elevated blood-pressure reading, without diagnosis of hypertension: Secondary | ICD-10-CM

## 2021-12-16 NOTE — Patient Instructions (Addendum)
Continue Vyepti and gabapentin Ubrelvy as needed

## 2021-12-16 NOTE — Telephone Encounter (Signed)
Patient seen on office today. Per Dr.Jaffe patient is to continue vyepti 300 mg Q12 weeks.  PA team patient is scheduled for her next infusion 01/13/22. If you could please check to see if she is due for another Pa for it.

## 2021-12-17 ENCOUNTER — Ambulatory Visit (INDEPENDENT_AMBULATORY_CARE_PROVIDER_SITE_OTHER): Payer: Medicare Other | Admitting: Nurse Practitioner

## 2021-12-17 ENCOUNTER — Encounter (INDEPENDENT_AMBULATORY_CARE_PROVIDER_SITE_OTHER): Payer: Self-pay | Admitting: Nurse Practitioner

## 2021-12-17 VITALS — BP 172/75 | HR 86 | Resp 17 | Ht 60.5 in | Wt 151.0 lb

## 2021-12-17 DIAGNOSIS — M316 Other giant cell arteritis: Secondary | ICD-10-CM

## 2021-12-18 NOTE — Telephone Encounter (Signed)
It is our pleasure. I look forward to meeting her in the coming weeks. GM

## 2021-12-18 NOTE — Telephone Encounter (Signed)
Spoke with patient and explained Dr. Doyne Keel recommendation that she establish with Dr. Rush Landmark.  She voiced understanding and agreement.  Appointment scheduled with Dr. Rush Landmark 02/12/22 at 9:50 a.m.    Patient expressed appreciation to both of you for taking the time to review and consider what's best for her and her care.

## 2021-12-25 ENCOUNTER — Encounter: Payer: Self-pay | Admitting: Family Medicine

## 2021-12-25 DIAGNOSIS — R21 Rash and other nonspecific skin eruption: Secondary | ICD-10-CM

## 2021-12-27 ENCOUNTER — Encounter (INDEPENDENT_AMBULATORY_CARE_PROVIDER_SITE_OTHER): Payer: Medicare Other | Admitting: Internal Medicine

## 2021-12-27 DIAGNOSIS — Z794 Long term (current) use of insulin: Secondary | ICD-10-CM

## 2021-12-27 DIAGNOSIS — E1165 Type 2 diabetes mellitus with hyperglycemia: Secondary | ICD-10-CM

## 2021-12-28 MED ORDER — REPAGLINIDE 0.5 MG PO TABS
1.0000 mg | ORAL_TABLET | Freq: Every day | ORAL | 1 refills | Status: DC
Start: 2021-12-28 — End: 2022-02-18

## 2021-12-28 NOTE — Telephone Encounter (Signed)
Please see the MyChart message reply(ies) for my assessment and plan.    This patient gave consent for this Medical Advice Message and is aware that it may result in a bill to [employer], as well as the possibility of receiving a bill for a co-payment or deductible. They are an established patient, but are not seeking medical advice exclusively about a problem treated during an in person or video visit in the last seven days. I did not recommend an in person or video visit within seven days of my reply.    I spent a total of 6 minutes cumulative time within 7 days through [employer].  Dorita Sciara, MD

## 2021-12-30 ENCOUNTER — Other Ambulatory Visit: Payer: Self-pay | Admitting: Neurology

## 2021-12-30 ENCOUNTER — Other Ambulatory Visit: Payer: Self-pay | Admitting: Family Medicine

## 2021-12-30 DIAGNOSIS — G8929 Other chronic pain: Secondary | ICD-10-CM

## 2022-01-03 ENCOUNTER — Encounter (INDEPENDENT_AMBULATORY_CARE_PROVIDER_SITE_OTHER): Payer: Self-pay | Admitting: Nurse Practitioner

## 2022-01-03 NOTE — Progress Notes (Signed)
Subjective:    Patient ID: Debra Leach, female    DOB: [birth year], 71 y.o.   MRN: [id number] No chief complaint on file.   The patient returns today following a right temporal artery biopsy for concern for temporal arteritis.  The biopsy was negative for temporal arteritis.  Since her last visit she has seen her neurologist and the visual changes have been determined to be a manifestation of her migraines.  She has suffered with migraines for decades.  She denies any issues with her wound.  There is no evidence of infection.    Review of Systems  Skin:  Positive for wound.  All other systems reviewed and are negative.      Objective:   Physical Exam Vitals reviewed.  HENT:     Head: Normocephalic.  Cardiovascular:     Rate and Rhythm: Normal rate.  Pulmonary:     Effort: Pulmonary effort is normal.  Skin:    General: Skin is warm and dry.  Neurological:     Mental Status: She is alert and oriented to person, place, and time.  Psychiatric:        Mood and Affect: Mood normal.        Behavior: Behavior normal.        Thought Content: Thought content normal.        Judgment: Judgment normal.     BP (!) 172/75 (BP Location: Left Arm)   Pulse 86   Resp 17   Ht 5' 0.5" (1.537 m)   Wt 151 lb (68.5 kg)   BMI 29.00 kg/m   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HCC)    Clotting disorder (HCC)    Complication of anesthesia    difficult waking   Diabetes mellitus without complication (HCC)    Diet and exercise controlled   DVT (deep venous thrombosis) ([place]) 02/2019   3calf 2 right lung 1 left lung   DVT (deep venous thrombosis) ([place]) 2020   right leg   Dyspnea    GERD (gastroesophageal reflux disease)    Headache    History of kidney stones    Hyperlipidemia    Hypertension    MVP (mitral valve prolapse)    history of   PONV (postoperative nausea and  vomiting)    Pulmonary emboli ([place]) 03/01/2019   2 in the right; 1 in the left    Social History   Socioeconomic History   Marital status: Married    Spouse name: Carloyn Manner   Number of children: 0   Years of education: Not on file   Highest education level: Not on file  Occupational History   Occupation: retired Energy manager: retired  Tobacco Use   Smoking status: Former    Types: Cigarettes   Smokeless tobacco: Never  Scientific laboratory technician Use: Never used  Substance and Sexual Activity   Alcohol use: No   Drug use: No   Sexual activity: Not on file  Other Topics Concern   Not on file  Social History Narrative   Regular exercise--yes, recumbent bike 3-4 days a week      Diet: fruits and veggies, water, eats at home, drinks a lot of milk      Patient is right-handed. She drinks 1-2 cups of coffee a day.      One story  home   Social Determinants of Health   Financial Resource Strain: Low Risk  (11/20/2019)   Overall Financial Resource Strain (CARDIA)    Difficulty of Paying Living Expenses: Not hard at all  Food Insecurity: No Food Insecurity (04/06/2019)   Hunger Vital Sign    Worried About Running Out of Food in the Last Year: Never true    Ran Out of Food in the Last Year: Never true  Transportation Needs: No Transportation Needs (04/06/2019)   PRAPARE - Hydrologist (Medical): No    Lack of Transportation (Non-Medical): No  Physical Activity: Inactive (04/06/2019)   Exercise Vital Sign    Days of Exercise per Week: 0 days    Minutes of Exercise per Session: 0 min  Stress: No Stress Concern Present (04/06/2019)   [place]    Feeling of Stress : Not at all  Social Connections: Not on file  Intimate Partner Violence: Not At Risk (04/06/2019)   Humiliation, Afraid, Rape, and Kick questionnaire    Fear of Current or Ex-Partner: No    Emotionally Abused: No     Physically Abused: No    Sexually Abused: No    Past Surgical History:  Procedure Laterality Date   arm surgery     left forearm fracture w/ plates pins screws   ARTERY BIOPSY Right 12/03/2021   Procedure: BIOPSY TEMPORAL ARTERY;  Surgeon: Algernon Huxley, MD;  Location: [facility] ORS;  Service: Vascular;  Laterality: Right;  Right temple   BREAST BIOPSY Left    BREAST SURGERY  2000   breast biopsy (benign)   LITHOTRIPSY  08/12/2008   stent placed bilaterally   PARTIAL HYSTERECTOMY     Both ovaries remain, vaginal, for mennorhagia   TONSILLECTOMY     TUBAL LIGATION  1980    Family History  Problem Relation Age of Onset   Cancer Mother        bone   Hypertension Father    Mitral valve prolapse Father    Asthma Brother    Arthritis Brother    Nephrolithiasis Brother    Nephrolithiasis Brother    Asthma Brother    Arthritis Brother    Aortic aneurysm Brother        ascending aortic aneuysm   Breast cancer Maternal Aunt    Breast cancer Maternal Aunt     Allergies  Allergen Reactions   Sudafed [Pseudoephedrine Hcl] Hives, Itching and Anxiety   Atenolol    Codeine     REACTION: Migraine   Epinephrine Hives   Ibuprofen    Jardiance [Empagliflozin]     Caused uti   Penicillins Itching    Has patient had a PCN reaction causing immediate rash, facial/tongue/throat swelling, SOB or lightheadedness with hypotension: No Has patient had a PCN reaction causing severe rash involving mucus membranes or skin necrosis: No Has patient had a PCN reaction that required hospitalization: No Has patient had a PCN reaction occurring within the last 10 years: No If all of the above answers are "NO", then may proceed with Cephalosporin use.    Pentazocine Lactate     REACTION: Swelling, itching, rash   Promethazine Other (See Comments)    migraine   Propranolol Other (See Comments)   Talwin [Pentazocine] Other (See Comments)    Swelling and itching    Crestor [Rosuvastatin] Other (See  Comments)       Latest Ref Rng & Units 11/03/2021  5:22 AM 11/02/2021    9:25 AM 08/15/2020   10:23 AM  CBC  WBC 4.0 - 10.5 K/uL 5.3  6.2  9.7   Hemoglobin 12.0 - 15.0 g/dL 11.2  12.5  12.0   Hematocrit 36.0 - 46.0 % 37.3  41.3  37.3   Platelets 150 - 400 K/uL 287  334  421.0       CMP     Component Value Date/Time   NA 143 11/03/2021 0522   K 3.9 11/03/2021 0522   CL 107 11/03/2021 0522   CO2 27 11/03/2021 0522   GLUCOSE 101 (H) 11/03/2021 0522   BUN 11 11/03/2021 0522   CREATININE 0.94 11/03/2021 0522   CREATININE 0.84 06/08/2016 1032   CALCIUM 8.1 (L) 11/03/2021 0522   PROT 6.6 11/03/2021 0522   ALBUMIN 2.8 (L) 11/03/2021 0522   AST 43 (H) 11/03/2021 0522   ALT 35 11/03/2021 0522   ALKPHOS 53 11/03/2021 0522   BILITOT 0.5 11/03/2021 0522   GFRNONAA >60 11/03/2021 0522   GFRAA >60 11/21/2019 1136     No results found.     Assessment & Plan:   1. Temporal arteritis (HCC) Patient's incision is healing well.  No evidence of infection or dehiscence.  Patient will continue to follow with neurology for chronic migraines.   Current Outpatient Medications on File Prior to Visit  Medication Sig Dispense Refill   atorvastatin (LIPITOR) 80 MG tablet Take 1 tablet (80 mg total) by mouth at bedtime.     Blood Glucose Monitoring Suppl (ONETOUCH VERIO REFLECT) w/Device KIT 2 (two) times daily. for testing     budesonide (ENTOCORT EC) 3 MG 24 hr capsule Take 2 capsules (6 mg total) by mouth daily. 60 capsule 2   Cholecalciferol (VITAMIN D3) 1.25 MG (50000 UT) CAPS Take 1 capsule by mouth once a week. 12 capsule 3   colesevelam (WELCHOL) 625 MG tablet TAKE 3 TABLETS BY MOUTH 2 TIMES DAILY WITH A MEAL. 540 tablet 3   Eptinezumab-jjmr (VYEPTI) 100 MG/ML injection Inject 300 mg into the vein every 3 (three) months.     gabapentin (NEURONTIN) 100 MG capsule TAKE 1 CAPSULE BY MOUTH UP TO 3 TIMES DAILY AS NEEDED. 270 capsule 3   glucose blood (ONETOUCH VERIO) test strip USE TO  CHECK BLOOD SUGAR TWO TIMES A DAY 200 strip 3   insulin glargine (LANTUS SOLOSTAR) 100 UNIT/ML Solostar Pen Inject 40 Units into the skin daily. 45 mL 3   Insulin Pen Needle (ULTICARE SHORT PEN NEEDLES) 31G X 8 MM MISC USE TO INJECT INSULIN DAILY 100 each 3   Lancets (ONETOUCH DELICA PLUS [id number]) MISC USE TO CHECK BLOOD SUGAR TWO TIMES DAILY 200 each 3   omeprazole (PRILOSEC) 20 MG capsule Take 20 mg by mouth. before breakfast and bedtime     ondansetron (ZOFRAN) 4 MG tablet Take 1 tablet (4 mg total) by mouth every 8 (eight) hours as needed for nausea or vomiting. 20 tablet 0   potassium chloride SA (KLOR-CON M) 20 MEQ tablet TAKE 1 TABLET BY MOUTH DAILY. 90 tablet 1   UBRELVY 100 MG TABS TAKE 1 TABLET BY MOUTH AS NEEDED (MAY REPEAT DOSE AFTER 2 HOURS IF NEEDED. MAXIMUM 2 TABLETS IN 24 HOURS). 10 tablet 3   vitamin B-12 (CYANOCOBALAMIN) 1000 MCG tablet Take 1 tablet by mouth daily.     No current facility-administered medications on file prior to visit.    There are no Patient Instructions on file for this visit.  No follow-ups on file.   Kris Hartmann, NP

## 2022-01-11 ENCOUNTER — Encounter: Payer: Self-pay | Admitting: Neurology

## 2022-01-12 ENCOUNTER — Other Ambulatory Visit (HOSPITAL_COMMUNITY): Payer: Self-pay

## 2022-01-15 ENCOUNTER — Ambulatory Visit (INDEPENDENT_AMBULATORY_CARE_PROVIDER_SITE_OTHER): Payer: Medicare Other | Admitting: Family Medicine

## 2022-01-15 ENCOUNTER — Encounter: Payer: Self-pay | Admitting: Family Medicine

## 2022-01-15 VITALS — BP 130/78 | HR 107 | Temp 97.9°F | Ht 60.5 in | Wt 150.0 lb

## 2022-01-15 DIAGNOSIS — L720 Epidermal cyst: Secondary | ICD-10-CM | POA: Diagnosis not present

## 2022-01-15 NOTE — Patient Instructions (Signed)
Vulval/perineal milia are 1-2 mm, white cysts very commonly seen on examination of the labia of older women. The patient may be aware of the multiple small lumps, but typically they are asymptomatic and an incidental finding.

## 2022-01-15 NOTE — Assessment & Plan Note (Signed)
Of the vulva and perineal tissue  No suggestion of viral or bacterial infection.  No sign of vaginal or vulvar cancer.  Reassured patient and recommended no treatment.

## 2022-01-15 NOTE — Progress Notes (Signed)
Patient ID: Debra Leach, female    DOB: [birth year], 71 y.o.   MRN: [id number]  This visit was conducted in person.  BP 130/78 (BP Location: Left Arm, Patient Position: Sitting, Cuff Size: Normal)   Pulse (!) 107   Temp 97.9 F (36.6 C) (Temporal)   Ht 5' 0.5" (1.537 m)   Wt 150 lb (68 kg)   SpO2 97%   BMI 28.81 kg/m    CC:  Chief Complaint  Patient presents with   Acute Visit    White bumps in vag/peri anal area x several months    Subjective:   HPI: Debra Leach is a 71 y.o. female presenting on 01/15/2022 for Acute Visit (White bumps in vag/peri anal area x several months)  In last several months she has noted white bumps in her vaginal and perianal area.  Areas are not itchy, no pain.  No change in size. No vaginal discharge.  No Dysuria.  She is not sexually active.    07/2021 UTI seen at Urgent Care.  STD blood work negative.  Had associated  Hematuria... now resolved.    Has appt with Derm in 06/2022 for rash on face. Relevant past medical, surgical, family and social history reviewed and updated as indicated. Interim medical history since our last visit reviewed. Allergies and medications reviewed and updated. Outpatient Medications Prior to Visit  Medication Sig Dispense Refill   atorvastatin (LIPITOR) 80 MG tablet Take 1 tablet (80 mg total) by mouth at bedtime.     Blood Glucose Monitoring Suppl (ONETOUCH VERIO REFLECT) w/Device KIT 2 (two) times daily. for testing     budesonide (ENTOCORT EC) 3 MG 24 hr capsule Take 2 capsules (6 mg total) by mouth daily. 60 capsule 2   Cholecalciferol (VITAMIN D3) 1.25 MG (50000 UT) CAPS Take 1 capsule by mouth once a week. 12 capsule 3   colesevelam (WELCHOL) 625 MG tablet TAKE 3 TABLETS BY MOUTH 2 TIMES DAILY WITH A MEAL. 540 tablet 3   ELIQUIS 2.5 MG TABS tablet TAKE 1 TABLET BY MOUTH 2 TIMES DAILY. 180 tablet 0   Eptinezumab-jjmr (VYEPTI) 100 MG/ML injection Inject 300 mg into the vein every 3  (three) months.     gabapentin (NEURONTIN) 100 MG capsule TAKE 1 CAPSULE BY MOUTH UP TO 3 TIMES DAILY AS NEEDED. 270 capsule 3   gabapentin (NEURONTIN) 300 MG capsule TAKE 2 CAPSULES BY MOUTH AT BEDTIME. 180 capsule 0   glucose blood (ONETOUCH VERIO) test strip USE TO CHECK BLOOD SUGAR TWO TIMES A DAY 200 strip 3   insulin glargine (LANTUS SOLOSTAR) 100 UNIT/ML Solostar Pen Inject 40 Units into the skin daily. 45 mL 3   Insulin Pen Needle (ULTICARE SHORT PEN NEEDLES) 31G X 8 MM MISC USE TO INJECT INSULIN DAILY 100 each 3   Lancets (ONETOUCH DELICA PLUS [id number]) MISC USE TO CHECK BLOOD SUGAR TWO TIMES DAILY 200 each 3   omeprazole (PRILOSEC) 20 MG capsule Take 20 mg by mouth. before breakfast and bedtime     ondansetron (ZOFRAN) 4 MG tablet Take 1 tablet (4 mg total) by mouth every 8 (eight) hours as needed for nausea or vomiting. 20 tablet 0   potassium chloride SA (KLOR-CON M) 20 MEQ tablet TAKE 1 TABLET BY MOUTH DAILY. 90 tablet 1   repaglinide (PRANDIN) 0.5 MG tablet Take 2 tablets (1 mg total) by mouth daily before lunch. before breakfast and lunch (Patient taking differently: Take 1 mg by mouth daily before  lunch.) 180 tablet 1   UBRELVY 100 MG TABS TAKE 1 TABLET BY MOUTH AS NEEDED (MAY REPEAT DOSE AFTER 2 HOURS IF NEEDED. MAXIMUM 2 TABLETS IN 24 HOURS). 10 tablet 3   vitamin B-12 (CYANOCOBALAMIN) 1000 MCG tablet Take 1 tablet by mouth daily.     No facility-administered medications prior to visit.     Per HPI unless specifically indicated in ROS section below Review of Systems  Constitutional:  Negative for fatigue and fever.  HENT:  Negative for congestion.   Eyes:  Negative for pain.  Respiratory:  Negative for cough and shortness of breath.   Cardiovascular:  Negative for chest pain, palpitations and leg swelling.  Gastrointestinal:  Negative for abdominal pain.  Genitourinary:  Negative for dysuria and vaginal bleeding.  Musculoskeletal:  Negative for back pain.  Neurological:   Negative for syncope, light-headedness and headaches.  Psychiatric/Behavioral:  Negative for dysphoric mood.    Objective:  BP 130/78 (BP Location: Left Arm, Patient Position: Sitting, Cuff Size: Normal)   Pulse (!) 107   Temp 97.9 F (36.6 C) (Temporal)   Ht 5' 0.5" (1.537 m)   Wt 150 lb (68 kg)   SpO2 97%   BMI 28.81 kg/m   Wt Readings from Last 3 Encounters:  01/15/22 150 lb (68 kg)  12/17/21 151 lb (68.5 kg)  12/16/21 151 lb (68.5 kg)      Physical Exam Exam conducted with a chaperone present.  Constitutional:      General: She is not in acute distress.    Appearance: Normal appearance. She is well-developed. She is not ill-appearing or toxic-appearing.  HENT:     Head: Normocephalic.     Right Ear: Hearing, tympanic membrane, ear canal and external ear normal. Tympanic membrane is not erythematous, retracted or bulging.     Left Ear: Hearing, tympanic membrane, ear canal and external ear normal. Tympanic membrane is not erythematous, retracted or bulging.     Nose: No mucosal edema or rhinorrhea.     Right Sinus: No maxillary sinus tenderness or frontal sinus tenderness.     Left Sinus: No maxillary sinus tenderness or frontal sinus tenderness.     Mouth/Throat:     Pharynx: Uvula midline.  Eyes:     General: Lids are normal. Lids are everted, no foreign bodies appreciated.     Conjunctiva/sclera: Conjunctivae normal.     Pupils: Pupils are equal, round, and reactive to light.  Neck:     Thyroid: No thyroid mass or thyromegaly.     Vascular: No carotid bruit.     Trachea: Trachea normal.  Cardiovascular:     Rate and Rhythm: Normal rate and regular rhythm.     Pulses: Normal pulses.     Heart sounds: Normal heart sounds, S1 normal and S2 normal. No murmur heard.    No friction rub. No gallop.  Pulmonary:     Effort: Pulmonary effort is normal. No tachypnea or respiratory distress.     Breath sounds: Normal breath sounds. No decreased breath sounds, wheezing,  rhonchi or rales.  Abdominal:     General: Bowel sounds are normal.     Palpations: Abdomen is soft.     Tenderness: There is no abdominal tenderness.     Hernia: There is no hernia in the left inguinal area or right inguinal area.  Genitourinary:    Pubic Area: No rash.      Labia:        Right: Lesion present.  Left: Lesion present.      Comments: Multiple lwhite milia in vaginal/vulvar/perineal area Musculoskeletal:     Cervical back: Normal range of motion and neck supple.  Skin:    General: Skin is warm and dry.     Findings: No rash.  Neurological:     Mental Status: She is alert.  Psychiatric:        Mood and Affect: Mood is not anxious or depressed.        Speech: Speech normal.        Behavior: Behavior normal. Behavior is cooperative.        Thought Content: Thought content normal.        Judgment: Judgment normal.       Results for orders placed or performed in visit on 12/10/21  HM DIABETES EYE EXAM  Result Value Ref Range   HM Diabetic Eye Exam No Retinopathy No Retinopathy     COVID 19 screen:  No recent travel or known exposure to [place] The patient denies respiratory symptoms of COVID 19 at this time. The importance of social distancing was discussed today.   Assessment and Plan Problem List Items Addressed This Visit     Milium - Primary    Of the vulva and perineal tissue  No suggestion of viral or bacterial infection.  No sign of vaginal or vulvar cancer.  Reassured patient and recommended no treatment.          Eliezer Lofts, MD

## 2022-01-19 ENCOUNTER — Ambulatory Visit (INDEPENDENT_AMBULATORY_CARE_PROVIDER_SITE_OTHER): Payer: Medicare Other | Admitting: Physician Assistant

## 2022-01-19 ENCOUNTER — Encounter: Payer: Self-pay | Admitting: Physician Assistant

## 2022-01-19 ENCOUNTER — Ambulatory Visit (INDEPENDENT_AMBULATORY_CARE_PROVIDER_SITE_OTHER): Payer: Medicare Other

## 2022-01-19 DIAGNOSIS — M79641 Pain in right hand: Secondary | ICD-10-CM | POA: Insufficient documentation

## 2022-01-19 DIAGNOSIS — M79644 Pain in right finger(s): Secondary | ICD-10-CM

## 2022-01-19 NOTE — Addendum Note (Signed)
Addended by: Melton Alar on: 01/19/2022 10:21 AM   Modules accepted: Orders

## 2022-01-19 NOTE — Progress Notes (Signed)
Office Visit Note   Patient: Debra Leach           Date of Birth: Sep 14, 1950           MRN: [id number] Visit Date: 01/19/2022              Requested by: [name], MD [place],  [place] [address] PCP: [name], MD  Chief Complaint  Patient presents with   Right Hand - New Patient (Initial Visit)      HPI: Patient is a pleasant 71 year old woman who is a patient of Dr. Rudene Anda.  She presents today with a 3-week history of right middle finger pain and loss of function.  She does have a history of diabetes and is on Eliquis.  She said she was doing her usual more morning routine when she had a large painful pop in her middle finger.  She had significant swelling in her knuckle however that has gone down.  She still has some pain.  She is unable to actively extend her middle finger.  She is right-hand dominant  Assessment & Plan: Visit Diagnoses:  1. Pain in right finger(s)   2. 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  I did consult with Dr. Tempie Donning.  We will have occupational therapy make her a relative motion brace.  She will follow-up in 3 weeks with Dr. Durward Fortes.  Explained the natural history of this as well as treatment  Follow-Up Instructions:    Ortho Exam  Patient is alert, oriented, no adenopathy, well-dressed, normal affect, normal respiratory effort. Right hand she does have some mild soft tissue swelling on the dorsal surface of the middle finger of her right hand.  She has no pain over the pulley.  She is not able to actively extend her middle finger.  Middle finger can be extended passively and she is able to hold a straight.  Finding consistent with rupture of the sagittal band  Imaging: No results found. No images are attached to the encounter.  Labs: Lab Results  Component Value Date   HGBA1C 8.8 (H) 08/17/2021   HGBA1C 9.5 (A) 07/13/2021   HGBA1C 9.5 (A)  05/08/2021   ESRSEDRATE 60 (H) 11/02/2021   ESRSEDRATE 102 (H) 10/16/2021   CRP 0.6 11/02/2021   REPTSTATUS 07/29/2021 FINAL 07/28/2021   CULT MULTIPLE SPECIES PRESENT, SUGGEST RECOLLECTION (A) 07/28/2021     Lab Results  Component Value Date   ALBUMIN 2.8 (L) 11/03/2021   ALBUMIN 3.7 08/17/2021   ALBUMIN 3.7 10/22/2020   PREALBUMIN 18 12/09/2017    Lab Results  Component Value Date   MG 2.1 11/02/2021   MG 2.0 05/08/2021   Lab Results  Component Value Date   VD25OH 21.40 (L) 08/17/2021   VD25OH 27.99 (L) 07/09/2020   VD25OH 25.81 (L) 01/08/2019    Lab Results  Component Value Date   PREALBUMIN 18 12/09/2017      Latest Ref Rng & Units 11/03/2021    5:22 AM 11/02/2021    9:25 AM 08/15/2020   10:23 AM  CBC EXTENDED  WBC 4.0 - 10.5 K/uL 5.3  6.2  9.7   RBC 3.87 - 5.11 MIL/uL 4.69  5.16  4.64   Hemoglobin 12.0 - 15.0 g/dL 11.2  12.5  12.0   HCT 36.0 - 46.0 % 37.3  41.3  37.3   Platelets 150 - 400 K/uL 287  334  421.0   NEUT# 1.4 - 7.7 K/uL   7.0   Lymph# 0.7 - 4.0 K/uL   1.5      There is no height or weight on file to calculate BMI.  Orders:  Orders Placed This Encounter  Procedures   XR Finger Middle Right   Ambulatory referral to Physical Therapy   No orders of the defined types were placed in this encounter.    Procedures: No procedures performed  Clinical Data: No additional findings.  ROS:  All other systems negative, except as noted in the HPI. Review of Systems  Objective: Vital Signs: There were no vitals taken for this visit.  Specialty Comments:  No specialty comments available.  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   Hypokalemia 11/03/2021   Diabetes mellitus with no complication ([place]) 76/19/5093    Vaginal candidiasis 08/06/2021   Hematuria, gross 08/06/2021   Type 2 diabetes mellitus with stage 3a chronic kidney disease, with long-term current use of insulin ([place]) 07/10/2021   Multinodular goiter 07/10/2021   Leg cramps 05/08/2021   Spondylolisthesis at L4-L5 level 04/24/2021   COVID-19 03/23/2021   Thyroid nodule 04/11/2020   AK (actinic keratosis) 09/26/2019   Dyspnea on minimal exertion 07/13/2019   Stuttering 07/13/2019   Hypertension associated with diabetes ([place]) 07/06/2019   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[place]) 07/06/2019   Multiple thyroid nodules 03/27/2019   DVT, lower extremity, proximal, acute, right ([place]) 03/13/2019   History of pulmonary embolism 03/01/2019   Leukocytosis 03/01/2019   Breast nodule 03/01/2019   Abnormal renal finding 06/29/2018   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   Facial rash 03/01/2017   Eustachian tube disorder, bilateral 12/16/2016   Lightheadedness 12/16/2016   Esophageal dysphagia 11/22/2016   Chronic low back pain 10/07/2016   Vision changes 08/12/2016   Aortic atherosclerosis ([place]) 06/11/2016   Supraclavicular fossa fullness 06/08/2016   Family history of aortic aneurysm 08/20/2014   Vitamin D deficiency 08/20/2014   Osteopenia 11/06/2009   Hyperlipidemia associated with type 2 diabetes mellitus ([place]) 07/05/2008   INSOMNIA, CHRONIC 07/05/2008   Migraine with aura 07/05/2008   GERD 07/05/2008   OSTEOARTHRITIS 07/05/2008   MITRAL VALVE PROLAPSE, HX OF 07/05/2008   NEPHROLITHIASIS, HX OF 07/05/2008   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[place])    Clotting disorder ([place])    Complication of anesthesia    difficult waking   Diabetes mellitus without complication ([facility])    Diet and exercise controlled   DVT (deep venous thrombosis) ([place]) 02/2019   3calf 2 right lung 1 left lung   DVT (deep venous  thrombosis) ([place]) 2020   right leg   Dyspnea    GERD (gastroesophageal reflux disease)    Headache    History of kidney stones    Hyperlipidemia    Hypertension    MVP (mitral valve prolapse)    history of   PONV (postoperative nausea and vomiting)    Pulmonary emboli ([place]) 03/01/2019   2 in the right; 1 in the left    Family History  Problem Relation Age of Onset   Cancer Mother  bone   Hypertension Father    Mitral valve prolapse Father    Asthma Brother    Arthritis Brother    Nephrolithiasis Brother    Nephrolithiasis Brother    Asthma Brother    Arthritis Brother    Aortic aneurysm Brother        ascending aortic aneuysm   Breast cancer Maternal Aunt    Breast cancer Maternal Aunt     Past Surgical History:  Procedure Laterality Date   arm surgery     left forearm fracture w/ plates pins screws   ARTERY BIOPSY Right 12/03/2021   Procedure: BIOPSY TEMPORAL ARTERY;  Surgeon: Algernon Huxley, MD;  Location: [facility] ORS;  Service: Vascular;  Laterality: Right;  Right temple   BREAST BIOPSY Left    BREAST SURGERY  2000   breast biopsy (benign)   LITHOTRIPSY  08/12/2008   stent placed bilaterally   PARTIAL HYSTERECTOMY     Both ovaries remain, vaginal, for mennorhagia   TONSILLECTOMY     TUBAL LIGATION  1980   Social History   Occupational History   Occupation: retired Energy manager: retired  Tobacco Use   Smoking status: Former    Types: Cigarettes   Smokeless tobacco: Never  Vaping Use   Vaping Use: Never used  Substance and Sexual Activity   Alcohol use: No   Drug use: No   Sexual activity: Not on file

## 2022-01-21 ENCOUNTER — Ambulatory Visit: Payer: Medicare Other | Admitting: Neurology

## 2022-01-21 ENCOUNTER — Ambulatory Visit (INDEPENDENT_AMBULATORY_CARE_PROVIDER_SITE_OTHER): Payer: Medicare Other | Admitting: Rehabilitative and Restorative Service Providers"

## 2022-01-21 ENCOUNTER — Other Ambulatory Visit: Payer: Self-pay

## 2022-01-21 ENCOUNTER — Encounter: Payer: Self-pay | Admitting: Rehabilitative and Restorative Service Providers"

## 2022-01-21 DIAGNOSIS — M25641 Stiffness of right hand, not elsewhere classified: Secondary | ICD-10-CM | POA: Diagnosis not present

## 2022-01-21 DIAGNOSIS — R600 Localized edema: Secondary | ICD-10-CM

## 2022-01-21 DIAGNOSIS — M6281 Muscle weakness (generalized): Secondary | ICD-10-CM

## 2022-01-21 DIAGNOSIS — R278 Other lack of coordination: Secondary | ICD-10-CM

## 2022-01-21 DIAGNOSIS — M79641 Pain in right hand: Secondary | ICD-10-CM | POA: Diagnosis not present

## 2022-01-21 NOTE — Therapy (Signed)
OUTPATIENT OCCUPATIONAL THERAPY ORTHO EVALUATION  Patient Name: Debra Leach MRN: [id number] DOB:1950-12-18, 71 y.o., female Today's Date: 01/21/2022  PCP: Dr. Eliezer Lofts, MD REFERRING PROVIDER: Dr. Sherilyn Cooter, MD    OT End of Session - 01/21/22 0930     Visit Number 1    Number of Visits 14    Date for OT Re-Evaluation 03/19/22    Authorization Type UHC Medicare    Progress Note Due on Visit 10    OT Start Time [phone]    OT Stop Time 1048    OT Time Calculation (min) 77 min    Equipment Utilized During Treatment orthotic materials    Activity Tolerance Patient tolerated treatment well;No increased pain;Patient limited by pain    Behavior During Therapy [facility] for tasks assessed/performed             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[place])    Clotting disorder (HCC)    Complication of anesthesia    difficult waking   Diabetes mellitus without complication (HCC)    Diet and exercise controlled   DVT (deep venous thrombosis) ([place]) 02/2019   3calf 2 right lung 1 left lung   DVT (deep venous thrombosis) ([place]) 2020   right leg   Dyspnea    GERD (gastroesophageal reflux disease)    Headache    History of kidney stones    Hyperlipidemia    Hypertension    MVP (mitral valve prolapse)    history of   PONV (postoperative nausea and vomiting)    Pulmonary emboli ([place]) 03/01/2019   2 in the right; 1 in the left   Past Surgical History:  Procedure Laterality Date   arm surgery     left forearm fracture w/ plates pins screws   ARTERY BIOPSY Right 12/03/2021   Procedure: BIOPSY TEMPORAL ARTERY;  Surgeon: Algernon Huxley, MD;  Location: [facility] ORS;  Service: Vascular;  Laterality: Right;  Right temple   BREAST BIOPSY Left    BREAST SURGERY  2000   breast biopsy (benign)   LITHOTRIPSY  08/12/2008   stent placed bilaterally   PARTIAL HYSTERECTOMY     Both  ovaries remain, vaginal, for [place]   Patient Active Problem List   Diagnosis Date Noted   Pain in right hand 01/19/2022   Milium 01/15/2022   Abnormal CXR 12/11/2021   Acute right-sided thoracic back pain 12/11/2021   SK (seborrheic keratosis) 11/27/2021   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   Hypokalemia 11/03/2021   Diabetes mellitus with no complication ([place]) 33/29/5188   Vaginal candidiasis 08/06/2021   Hematuria, gross 08/06/2021   Type 2 diabetes mellitus with stage 3a chronic kidney disease, with long-term current use of insulin ([place]) 07/10/2021   Multinodular goiter 07/10/2021   Leg cramps 05/08/2021   Spondylolisthesis at L4-L5 level 04/24/2021   COVID-19 03/23/2021   Thyroid nodule 04/11/2020   AK (actinic keratosis) 09/26/2019   Dyspnea on minimal exertion 07/13/2019   Stuttering 07/13/2019   Hypertension associated with diabetes ([place]) 07/06/2019   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[place]) 07/06/2019   Multiple thyroid nodules 03/27/2019   DVT, lower extremity, proximal, acute, right ([place]) 03/13/2019   History of pulmonary embolism 03/01/2019   Leukocytosis 03/01/2019   Breast nodule 03/01/2019   Abnormal renal finding 06/29/2018   Autoimmune  pancreatitis ([place]) 08/16/2017   Facial rash 03/01/2017   Eustachian tube disorder, bilateral 12/16/2016   Lightheadedness 12/16/2016   Esophageal dysphagia 11/22/2016   Chronic low back pain 10/07/2016   Vision changes 08/12/2016   Aortic atherosclerosis ([place]) 06/11/2016   Supraclavicular fossa fullness 06/08/2016   Family history of aortic aneurysm 08/20/2014   Vitamin D deficiency 08/20/2014   Osteopenia 11/06/2009   Hyperlipidemia associated with type 2 diabetes mellitus ([place]) 07/05/2008   INSOMNIA, CHRONIC 07/05/2008   Migraine with aura 07/05/2008   GERD 07/05/2008   OSTEOARTHRITIS 07/05/2008   MITRAL VALVE PROLAPSE, HX OF 07/05/2008   NEPHROLITHIASIS,  HX OF 07/05/2008    ONSET DATE: "about a month" (12/18/21 aprox DOI)   REFERRING DIAG: W29.937 (ICD-10-CM) - Pain in right finger(s)  THERAPY DIAG:  Localized edema  Muscle weakness (generalized)  Stiffness of right hand, not elsewhere classified  Pain in right hand  Other lack of coordination  Rationale for Evaluation and Treatment Rehabilitation  SUBJECTIVE:   SUBJECTIVE STATEMENT: She is a kind, retired Chief of Staff. She states the pain is getting better now about a month since injury. She states "flicking" her fingers after her shower and feeling a painful pop. She shows me how she can hold finger extended but after active flexion, she must lift it up again with her left hand (can't extend). She states limited functional ability with self-care, home tasks and leisure pursuits now.   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 Yoke or immobilization approach typically lasts at least 4 weeks; she states autoimmune disease and has past multiple DVTs and PE (increased risk)   WEIGHT BEARING RESTRICTIONS Yes <5# recommended for 4-6 weeks at least   PAIN:  Are you having pain? Yes Rating: 2/10 at rest now, up to 4-5/10 in past week in MCP J Rt hand MF   FALLS: Has patient fallen in last 6 months? Yes, in May 2023 had dehydration, syncope, fall  LIVING ENVIRONMENT: Lives with: lives with their spouse   PLOF: Independent with basic ADLs, Independent with household mobility without device, Independent with community mobility without device, and Independent with gait  PATIENT GOALS have better use of right hand for daily life and less pain  OBJECTIVE:   HAND DOMINANCE: Right   ADLs: Overall ADLs: States decreased ability to grab, hold household objects, pain and inability to open containers, perform all FMS tasks, etc.     FUNCTIONAL OUTCOME MEASURES: Eval: Patient Specific Functional Scale: 4.6 (holding dishes, brushing teeth, open jars)  UPPER EXTREMITY ROM    Eval 01/21/22: Right hand: She makes full fist, but cannot open MF once closed. With self-assist to open she can hold all fingers in full extension (minus some bend in MF CMP J due to  moderate swelling at MCP J).  No significant issues or complaints in proximal arm, wrist, elbow, shoulder, etc., b/l   Active ROM Right TBD  Thumb MCP (0-60)   Thumb IP (0-80)   Thumb Opposition to Small Finger Able today  Long MCP (0-90)    Long PIP (0-100)    Long DIP (0-70)    (Blank rows = not tested)   UPPER EXTREMITY MMT:    Eval: not appropriate in right hand now (pain, weakness, healing ligaments, etc.), but for the most part, b/l proximal UE's seem equal strength b/l, no complaints.  MMT Right TBD PRN  Elbow flexion   Elbow extension   Wrist flexion   Wrist extension   Wrist ulnar deviation   Wrist radial deviation   Wrist pronation   Wrist supination   (Blank rows = not tested)  HAND FUNCTION: Eval: Grip strength Right: TBD when safe   COORDINATION: Eval: TBD but observably impaired in right hand due to inability to open hand after closing it. Details TBD  Box and Blocks Test: TBD Blocks today (TBD is WFL)  SENSATION: Eval:  Light touch intact today  EDEMA:   Eval:  Moderately swollen in right hand at 3rd MCP J   COGNITION: Overall cognitive status: WFL for evaluation today, memory, recall, awareness, etc. all WNL in casual conversation  OBSERVATIONS:   Eval: OT can palpate the EDC to MF slipping ulnarly when she flexes.  It is getting "stuck" there, preventing proper mechanics to extend her MCP J.  Pencil test for RMO is successful in that she can extend her middle finger on her own power, pain is less, and no slippage of EDC is felt.  TTP around swollen 3rd MCP J today, red.    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 OT fabricated 2 custom relative motion orthotics (multiple were necessary incase one got wet during self-care activities) to keep right MF MCP J extended ~25* more, relative to surrounding MCP Js. It fit well with no areas of pressure, pt states a comfortable fit. She was able to actively flex and extend fingers with it on with less pain than prior. Pt was educated on the wearing schedule, to call or come in ASAP if it is causing any irritation or is not achieving desired function. It will be checked/adjusted in upcoming sessions, as needed. Pt states understanding.   OT also assigns home exercises for AROM & PROM within orthosis as below, she states understanding, demo's back, tolerates well.   3-4xday:  Open/close hand without pain x15 Reach overhead and bend elbows x15  Touch thumb to each finger x15    PATIENT EDUCATION: Education details: See tx section above for details  Person educated: Patient Education method: Verbal Instruction, Teach back, Handouts  Education comprehension: States and demonstrates understanding, Additional Education required    HOME EXERCISE PROGRAM: See tx section above for details   GOALS: Goals reviewed with patient? Yes   SHORT TERM GOALS: (STG required if POC>30 days)  Pt will obtain protective, custom orthotic. Target date: 01/21/22 Goal status: MET  2.  Pt will demo/state understanding of initial HEP to improve pain levels and prerequisite motion. Target date: 01/29/22 Goal status: INITIAL   LONG TERM GOALS:  Pt will improve functional ability by decreased impairment per PSFS assessment from 4.6 to 7 or better, for better quality of life. Target date: 03/19/22 Goal status: INITIAL  2.  Pt will improve grip strength in right hand to at least 40lbs for functional use at home and in IADLs. Target date:  03/19/22 Goal status: INITIAL  3.  Pt will improve A/ROM in right MF AROM flexion/extension from unable to actively extend after flexion to full functional flexion (at least 220*) and extension (0* at all joints), to have functional motion for tasks like reach and grasp.  Target date: 03/19/22 Goal status: INITIAL  4.  Pt will decrease pain at worst from 4/10 to 2/10 or  better to have better sleep and occupational participation in daily roles. Target date: 03/19/22 Goal status: INITIAL   ASSESSMENT:  CLINICAL IMPRESSION: Patient is a 71 y.o. 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 She will benefit from OP OT to increase quality of life.   PERFORMANCE DEFICITS in functional skills including ADLs, IADLs, coordination, dexterity, ROM, strength, pain, fascial restrictions, flexibility, Matthews, GMC, body mechanics, endurance, and UE functional use, cognitive skills including problem solving and safety awareness, and psychosocial skills including coping strategies, environmental adaptation, and routines and behaviors.   IMPAIRMENTS are limiting patient from ADLs, IADLs, work, play, leisure, and social participation.   COMORBIDITIES has co-morbidities such as b/l knee OA and pain, hx of DVTs, GERD, HTN, HLD, DM II, mitral valve collapse, and more  that can affect occupational performance. Patient will benefit from skilled OT to address above impairments and improve overall function.  MODIFICATION OR ASSISTANCE TO COMPLETE EVALUATION: No modification of tasks or assist necessary to complete an evaluation.  OT OCCUPATIONAL PROFILE AND HISTORY: Problem focused assessment: Including review of records relating to presenting problem.  CLINICAL DECISION MAKING: Moderate - several treatment options, min-mod task modification necessary  REHAB POTENTIAL: Fair due to 1 month since injury and several  comorbidities negatively affecting healing  EVALUATION COMPLEXITY: Low      PLAN: OT FREQUENCY: 1-2x/week  OT DURATION: 8 weeks (through 03/19/22)  PLANNED INTERVENTIONS: self care/ADL training, therapeutic exercise, therapeutic activity, neuromuscular re-education, manual therapy, scar mobilization, passive range of motion, splinting, ultrasound, fluidotherapy, compression bandaging, moist heat, cryotherapy, contrast bath, patient/family education, coping strategies training, and Re-evaluation  RECOMMENDED OTHER SERVICES: none now   CONSULTED AND AGREED WITH PLAN OF CARE: Patient  PLAN FOR NEXT SESSION: Check orthotics, check motion and HEP, consider night-time immobilization orthotic if RMO is painful or not sufficient for resting hand at night.  If all goes well, she will need to continue resting sagittal band for 4-8 weeks possibly before motion outside of orthotic will be safe (affected by age, DM II, and other factors)   Benito Mccreedy, OTR/L, CHT 01/21/2022, 3:45 PM

## 2022-01-22 ENCOUNTER — Ambulatory Visit (INDEPENDENT_AMBULATORY_CARE_PROVIDER_SITE_OTHER): Payer: Medicare Other | Admitting: Rehabilitative and Restorative Service Providers"

## 2022-01-22 ENCOUNTER — Encounter: Payer: Self-pay | Admitting: Rehabilitative and Restorative Service Providers"

## 2022-01-22 DIAGNOSIS — M6281 Muscle weakness (generalized): Secondary | ICD-10-CM | POA: Diagnosis not present

## 2022-01-22 DIAGNOSIS — M25641 Stiffness of right hand, not elsewhere classified: Secondary | ICD-10-CM | POA: Diagnosis not present

## 2022-01-22 DIAGNOSIS — R600 Localized edema: Secondary | ICD-10-CM

## 2022-01-22 DIAGNOSIS — M79641 Pain in right hand: Secondary | ICD-10-CM | POA: Diagnosis not present

## 2022-01-22 DIAGNOSIS — R278 Other lack of coordination: Secondary | ICD-10-CM

## 2022-01-22 NOTE — Therapy (Signed)
OUTPATIENT OCCUPATIONAL THERAPY TREATMENT NOTE   Patient Name: Debra Leach MRN: [id number] DOB:May 09, 1951, 71 y.o., female Today's Date: 01/22/2022  PCP: Dr. Eliezer Lofts, MD REFERRING PROVIDER: Dr. Sherilyn Cooter, MD   END OF SESSION:   OT End of Session - 01/22/22 0851     Visit Number 2    Number of Visits 14    Date for OT Re-Evaluation 03/19/22    Authorization Type UHC Medicare    Progress Note Due on Visit 10    OT Start Time [phone]    OT Stop Time 0941    OT Time Calculation (min) 50 min    Equipment Utilized During Treatment orthotic materials    Activity Tolerance Patient tolerated treatment well;No increased pain;Patient limited by pain    Behavior During Therapy [facility] for tasks assessed/performed             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[place])    Clotting disorder (HCC)    Complication of anesthesia    difficult waking   Diabetes mellitus without complication (HCC)    Diet and exercise controlled   DVT (deep venous thrombosis) ([place]) 02/2019   3calf 2 right lung 1 left lung   DVT (deep venous thrombosis) ([place]) 2020   right leg   Dyspnea    GERD (gastroesophageal reflux disease)    Headache    History of kidney stones    Hyperlipidemia    Hypertension    MVP (mitral valve prolapse)    history of   PONV (postoperative nausea and vomiting)    Pulmonary emboli ([place]) 03/01/2019   2 in the right; 1 in the left   Past Surgical History:  Procedure Laterality Date   arm surgery     left forearm fracture w/ plates pins screws   ARTERY BIOPSY Right 12/03/2021   Procedure: BIOPSY TEMPORAL ARTERY;  Surgeon: Algernon Huxley, MD;  Location: [facility] ORS;  Service: Vascular;  Laterality: Right;  Right temple   BREAST BIOPSY Left    BREAST SURGERY  2000   breast biopsy (benign)   LITHOTRIPSY  08/12/2008   stent placed bilaterally   PARTIAL  HYSTERECTOMY     Both ovaries remain, vaginal, for [place]   Patient Active Problem List   Diagnosis Date Noted   Pain in right hand 01/19/2022   Milium 01/15/2022   Abnormal CXR 12/11/2021   Acute right-sided thoracic back pain 12/11/2021   SK (seborrheic keratosis) 11/27/2021   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   Hypokalemia 11/03/2021   Diabetes mellitus with no complication ([place]) 51/88/4166   Vaginal candidiasis 08/06/2021   Hematuria, gross 08/06/2021   Type 2 diabetes mellitus with stage 3a chronic kidney disease, with long-term current use of insulin ([place]) 07/10/2021   Multinodular goiter 07/10/2021   Leg cramps 05/08/2021   Spondylolisthesis at L4-L5 level 04/24/2021   COVID-19 03/23/2021   Thyroid nodule 04/11/2020   AK (actinic keratosis) 09/26/2019   Dyspnea on minimal exertion 07/13/2019   Stuttering 07/13/2019   Hypertension associated with diabetes ([place]) 07/06/2019   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[place]) 07/06/2019   Multiple thyroid nodules 03/27/2019   DVT, lower extremity, proximal, acute, right ([place]) 03/13/2019   History of pulmonary embolism 03/01/2019   Leukocytosis 03/01/2019   Breast nodule 03/01/2019   Abnormal renal  finding 06/29/2018   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[place]) 08/16/2017   Facial rash 03/01/2017   Eustachian tube disorder, bilateral 12/16/2016   Lightheadedness 12/16/2016   Esophageal dysphagia 11/22/2016   Chronic low back pain 10/07/2016   Vision changes 08/12/2016   Aortic atherosclerosis ([place]) 06/11/2016   Supraclavicular fossa fullness 06/08/2016   Family history of aortic aneurysm 08/20/2014   Vitamin D deficiency 08/20/2014   Osteopenia 11/06/2009   Hyperlipidemia associated with type 2 diabetes mellitus ([place]) 07/05/2008   INSOMNIA, CHRONIC 07/05/2008   Migraine with aura 07/05/2008   GERD 07/05/2008   OSTEOARTHRITIS 07/05/2008   MITRAL VALVE PROLAPSE, HX OF  07/05/2008   NEPHROLITHIASIS, HX OF 07/05/2008    ONSET DATE: "about a month" (12/18/21 aprox DOI)    REFERRING DIAG: G29.528 (ICD-10-CM) - Pain in right finger(s)  THERAPY DIAG:  Localized edema  Muscle weakness (generalized)  Stiffness of right hand, not elsewhere classified  Pain in right hand  Other lack of coordination  Rationale for Evaluation and Treatment Rehabilitation  PERTINENT HISTORY: Per MD: "Patient will need to be evaluated by OT for relative motion, sagittal band rupture for right middle finger"    PRECAUTIONS: ~1 month since injury. Yoke or immobilization approach typically lasts at least 4 weeks; she states autoimmune disease and has past multiple DVTs and PE (increased risk)    WEIGHT BEARING RESTRICTIONS Yes <5# recommended for 4-6 weeks at least     SUBJECTIVE:  She states that her new braces were rubbing a bit between her RF and SF and that they need some adjustments.   PAIN:  Are you having pain? Yes  Rating: 4/10 at rest now in middle finger    OBJECTIVE: (All objective assessments below are from initial evaluation on: 01/21/22 unless otherwise specified.)   HAND DOMINANCE: Right    ADLs: Overall ADLs: States decreased ability to grab, hold household objects, pain and inability to open containers, perform all FMS tasks, etc.      FUNCTIONAL OUTCOME MEASURES: Eval: Patient Specific Functional Scale: 4.6 (holding dishes, brushing teeth, open jars)   UPPER EXTREMITY ROM    Eval 01/21/22: Right hand: She makes full fist, but cannot open MF once closed. With self-assist to open she can hold all fingers in full extension (minus some bend in MF CMP J due to  moderate swelling at MCP J).  No significant issues or complaints in proximal arm, wrist, elbow, shoulder, etc., b/l    Active ROM Right TBD  Thumb MCP (0-60)    Thumb IP (0-80)    Thumb Opposition to Small Finger Able today  Long MCP (0-90)    Long PIP (0-100)    Long DIP (0-70)    (Blank  rows = not tested)     UPPER EXTREMITY MMT:    Eval: not appropriate in right hand now (pain, weakness, healing ligaments, etc.), but for the most part, b/l proximal UE's seem equal strength b/l, no complaints.    MMT Right TBD PRN  Elbow flexion    Elbow extension    Wrist flexion    Wrist extension    Wrist ulnar deviation    Wrist radial deviation    Wrist pronation    Wrist supination    (Blank rows = not tested)   HAND FUNCTION: Eval: Grip strength Right: TBD when safe    COORDINATION: Eval: TBD but observably impaired in right hand due to inability to open hand after closing it. Details TBD  Box and Blocks Test: TBD  Blocks today (TBD is WFL)   SENSATION: Eval:  Light touch intact today   EDEMA:             Eval:  Moderately swollen in right hand at 3rd MCP J    COGNITION: Overall cognitive status: WFL for evaluation today, memory, recall, awareness, etc. all WNL in casual conversation   OBSERVATIONS:             Eval: OT can palpate the EDC to MF slipping ulnarly when she flexes.  It is getting "stuck" there, preventing proper mechanics to extend her MCP J.  Pencil test for RMO is successful in that she can extend her middle finger on her own power, pain is less, and no slippage of EDC is felt.  TTP around swollen 3rd MCP J today, red.      TODAY'S TREATMENT:  01/22/22: OT adjusts her 2 orthoses to include SF to prevent rubbing in webspace and makes 2 additional styles, in case she would find one more comfortable than the others. OT ues different materials today (thermoplastic and orficast), makes one RMO without closure, which she finds more comfortable for day use, but she was not recommended to wear this one at night.  OT also reviews HEP with her and she is comfortable with current plan and states all orthotics fit very well now.     PATIENT EDUCATION: Education details: See tx section above for details  Person educated: Patient Education method: Verbal  Instruction, Teach back, Handouts  Education comprehension: States and demonstrates understanding, Additional Education required      HOME EXERCISE PROGRAM: See tx section above for details    GOALS: Goals reviewed with patient? Yes     SHORT TERM GOALS: (STG required if POC>30 days)   Pt will obtain protective, custom orthotic. Target date: 01/21/22 Goal status: MET   2.  Pt will demo/state understanding of initial HEP to improve pain levels and prerequisite motion. Target date: 01/29/22 Goal status: INITIAL     LONG TERM GOALS:   Pt will improve functional ability by decreased impairment per PSFS assessment from 4.6 to 7 or better, for better quality of life. Target date: 03/19/22 Goal status: INITIAL   2.  Pt will improve grip strength in right hand to at least 40lbs for functional use at home and in IADLs. Target date: 03/19/22 Goal status: INITIAL   3.  Pt will improve A/ROM in right MF AROM flexion/extension from unable to actively extend after flexion to full functional flexion (at least 220*) and extension (0* at all joints), to have functional motion for tasks like reach and grasp.  Target date: 03/19/22 Goal status: INITIAL   4.  Pt will decrease pain at worst from 4/10 to 2/10 or better to have better sleep and occupational participation in daily roles. Target date: 03/19/22 Goal status: INITIAL     ASSESSMENT:   CLINICAL IMPRESSION: 01/22/22: She has several orthotic options now and will hopefully have no more pain/rubbing. Will f/u next week to insure no issues due to frail skin/skin breakdown, as she has a hx with this.     PLAN: OT FREQUENCY: 1-2x/week   OT DURATION: 8 weeks (through 03/19/22)   PLANNED INTERVENTIONS: self care/ADL training, therapeutic exercise, therapeutic activity, neuromuscular re-education, manual therapy, scar mobilization, passive range of motion, splinting, ultrasound, fluidotherapy, compression bandaging, moist heat,  cryotherapy, contrast bath, patient/family education, coping strategies training, and Re-evaluation   RECOMMENDED OTHER SERVICES: none now    CONSULTED AND  AGREED WITH PLAN OF CARE: Patient   PLAN FOR NEXT SESSION:  Check orthotics, check motion and HEP, consider night-time immobilization orthotic if RMO is painful or not sufficient for resting hand at night.    Benito Mccreedy, OTR/L, CHT 01/22/2022, 1:09 PM

## 2022-01-27 ENCOUNTER — Ambulatory Visit (INDEPENDENT_AMBULATORY_CARE_PROVIDER_SITE_OTHER): Payer: Medicare Other | Admitting: Rehabilitative and Restorative Service Providers"

## 2022-01-27 ENCOUNTER — Encounter: Payer: Self-pay | Admitting: Rehabilitative and Restorative Service Providers"

## 2022-01-27 DIAGNOSIS — M79641 Pain in right hand: Secondary | ICD-10-CM

## 2022-01-27 DIAGNOSIS — R42 Dizziness and giddiness: Secondary | ICD-10-CM

## 2022-01-27 DIAGNOSIS — M6281 Muscle weakness (generalized): Secondary | ICD-10-CM | POA: Diagnosis not present

## 2022-01-27 DIAGNOSIS — M25641 Stiffness of right hand, not elsewhere classified: Secondary | ICD-10-CM

## 2022-01-27 DIAGNOSIS — R600 Localized edema: Secondary | ICD-10-CM

## 2022-01-27 DIAGNOSIS — R278 Other lack of coordination: Secondary | ICD-10-CM

## 2022-01-27 DIAGNOSIS — R2681 Unsteadiness on feet: Secondary | ICD-10-CM

## 2022-01-27 NOTE — Therapy (Signed)
OUTPATIENT OCCUPATIONAL THERAPY TREATMENT NOTE   Patient Name: Debra Leach MRN: [id number] DOB:1950-06-21, 71 y.o., female Today's Date: 01/27/2022  PCP: Dr. Eliezer Lofts, MD REFERRING PROVIDER: Dr. Sherilyn Cooter, MD   END OF SESSION:   OT End of Session - 01/27/22 0922     Visit Number 3    Number of Visits 14    Date for OT Re-Evaluation 03/19/22    Authorization Type [employer] Medicare    Progress Note Due on Visit 10    OT Start Time [phone]    OT Stop Time 1038    OT Time Calculation (min) 73 min    Equipment Utilized During Treatment orthotic materials    Activity Tolerance Patient tolerated treatment well;No increased pain;Patient limited by pain    Behavior During Therapy [facility] for tasks assessed/performed             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[place])    Clotting disorder (HCC)    Complication of anesthesia    difficult waking   Diabetes mellitus without complication (HCC)    Diet and exercise controlled   DVT (deep venous thrombosis) ([place]) 02/2019   3calf 2 right lung 1 left lung   DVT (deep venous thrombosis) ([place]) 2020   right leg   Dyspnea    GERD (gastroesophageal reflux disease)    Headache    History of kidney stones    Hyperlipidemia    Hypertension    MVP (mitral valve prolapse)    history of   PONV (postoperative nausea and vomiting)    Pulmonary emboli ([place]) 03/01/2019   2 in the right; 1 in the left   Past Surgical History:  Procedure Laterality Date   arm surgery     left forearm fracture w/ plates pins screws   ARTERY BIOPSY Right 12/03/2021   Procedure: BIOPSY TEMPORAL ARTERY;  Surgeon: Algernon Huxley, MD;  Location: [facility] ORS;  Service: Vascular;  Laterality: Right;  Right temple   BREAST BIOPSY Left    BREAST SURGERY  2000   breast biopsy (benign)   LITHOTRIPSY  08/12/2008   stent placed bilaterally   PARTIAL  HYSTERECTOMY     Both ovaries remain, vaginal, for [place]   Patient Active Problem List   Diagnosis Date Noted   Pain in right hand 01/19/2022   Milium 01/15/2022   Abnormal CXR 12/11/2021   Acute right-sided thoracic back pain 12/11/2021   SK (seborrheic keratosis) 11/27/2021   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   Hypokalemia 11/03/2021   Diabetes mellitus with no complication ([place]) 64/40/3474   Vaginal candidiasis 08/06/2021   Hematuria, gross 08/06/2021   Type 2 diabetes mellitus with stage 3a chronic kidney disease, with long-term current use of insulin ([place]) 07/10/2021   Multinodular goiter 07/10/2021   Leg cramps 05/08/2021   Spondylolisthesis at L4-L5 level 04/24/2021   COVID-19 03/23/2021   Thyroid nodule 04/11/2020   AK (actinic keratosis) 09/26/2019   Dyspnea on minimal exertion 07/13/2019   Stuttering 07/13/2019   Hypertension associated with diabetes ([place]) 07/06/2019   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[place]) 07/06/2019   Multiple thyroid nodules 03/27/2019   DVT, lower extremity, proximal, acute, right ([place]) 03/13/2019   History of pulmonary embolism 03/01/2019   Leukocytosis 03/01/2019   Breast nodule 03/01/2019   Abnormal renal  finding 06/29/2018   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[place]) 08/16/2017   Facial rash 03/01/2017   Eustachian tube disorder, bilateral 12/16/2016   Lightheadedness 12/16/2016   Esophageal dysphagia 11/22/2016   Chronic low back pain 10/07/2016   Vision changes 08/12/2016   Aortic atherosclerosis ([place]) 06/11/2016   Supraclavicular fossa fullness 06/08/2016   Family history of aortic aneurysm 08/20/2014   Vitamin D deficiency 08/20/2014   Osteopenia 11/06/2009   Hyperlipidemia associated with type 2 diabetes mellitus ([place]) 07/05/2008   INSOMNIA, CHRONIC 07/05/2008   Migraine with aura 07/05/2008   GERD 07/05/2008   OSTEOARTHRITIS 07/05/2008   MITRAL VALVE PROLAPSE, HX OF  07/05/2008   NEPHROLITHIASIS, HX OF 07/05/2008    ONSET DATE: "about a month" (12/18/21 aprox DOI)    REFERRING DIAG: M08.676 (ICD-10-CM) - Pain in right finger(s)  THERAPY DIAG:  Localized edema  Muscle weakness (generalized)  Stiffness of right hand, not elsewhere classified  Pain in right hand  Other lack of coordination  Unsteadiness on feet  Dizziness and giddiness  Rationale for Evaluation and Treatment Rehabilitation  PERTINENT HISTORY: Per MD: "Patient will need to be evaluated by OT for relative motion, sagittal band rupture for right middle finger"    PRECAUTIONS: ~6 weeks since injury. [place] or immobilization approach typically lasts at least 4 weeks; she states autoimmune disease and has past multiple DVTs and PE (increased risk)    WEIGHT BEARING RESTRICTIONS Yes <5# recommended for 4-6 weeks at least     SUBJECTIVE:  She states her orthotics are working much better now- no pain now (needs a minor adjustment due to fragile skin concerns), but she does fear some instability at night while asleep and would like an immobilization orthosis- OT agrees this is a good safety option for night.    PAIN:  Are you having pain? No Rating: 0/10 at rest now in middle finger    OBJECTIVE: (All objective assessments below are from initial evaluation on: 01/21/22 unless otherwise specified.)   HAND DOMINANCE: Right    ADLs: Overall ADLs: States decreased ability to grab, hold household objects, pain and inability to open containers, perform all FMS tasks, etc.      FUNCTIONAL OUTCOME MEASURES: Eval: Patient Specific Functional Scale: 4.6 (holding dishes, brushing teeth, open jars)   UPPER EXTREMITY ROM    Eval 01/21/22: Right hand: She makes full fist, but cannot open MF once closed. With self-assist to open she can hold all fingers in full extension (minus some bend in MF CMP J due to  moderate swelling at MCP J).  No significant issues or complaints in proximal arm,  wrist, elbow, shoulder, etc., b/l    Active ROM Right TBD  Thumb MCP (0-60)    Thumb IP (0-80)    Thumb Opposition to Small Finger Able today  Long MCP (0-90)    Long PIP (0-100)    Long DIP (0-70)    (Blank rows = not tested)     UPPER EXTREMITY MMT:    Eval: not appropriate in right hand now (pain, weakness, healing ligaments, etc.), but for the most part, b/l proximal UE's seem equal strength b/l, no complaints.    MMT Right TBD PRN  Elbow flexion    Elbow extension    Wrist flexion    Wrist extension    Wrist ulnar deviation    Wrist radial deviation    Wrist pronation    Wrist supination    (Blank rows = not tested)   HAND FUNCTION: Eval: Grip  strength Right: TBD when safe    COORDINATION: Eval: TBD but observably impaired in right hand due to inability to open hand after closing it. Details TBD  Box and Blocks Test: TBD Blocks today (TBD is WFL)   SENSATION: Eval:  Light touch intact today   EDEMA:             Eval:  Moderately swollen in right hand at 3rd MCP J    COGNITION: Overall cognitive status: WFL for evaluation today, memory, recall, awareness, etc. all WNL in casual conversation   OBSERVATIONS:             Eval: OT can palpate the EDC to MF slipping ulnarly when she flexes.  It is getting "stuck" there, preventing proper mechanics to extend her MCP J.  Pencil test for RMO is successful in that she can extend her middle finger on her own power, pain is less, and no slippage of EDC is felt.  TTP around swollen 3rd MCP J today, red.      TODAY'S TREATMENT:  01/27/22: Due to fragile skin and hx of skin tears the orthotic process has been complicated. OT provides lightly compressive isotoner glove for her to wear underneath RMO's but they fit too tightly with glove on, so again OT adjusts 2 RMO's to be a bit loser, and they seem to work better.  Next OT makes new wrist and middle finger immobilization orthotic for night or if other RMOs become painful or  intolerable to fragile skin. It blocks MF MCP J in slight hyperext and wrist in slight (~10*) extension, fits well, no rubbing, and she is given sleeve to wear under to prevent skin breakdown.  She states feeling very supported by this brace.  OT goes over HEP for finger motion and wrist motion in RMO to tolerance and clarifies that wrist motion should be in tenodesis to prevnt stron pressures on EDC/sagittal band for now. She demo's back with no problems, states understanding.    01/22/22: OT adjusts her 2 orthoses to include SF to prevent rubbing in webspace and makes 2 additional styles, in case she would find one more comfortable than the others. OT ues different materials today (thermoplastic and orficast), makes one RMO without closure, which she finds more comfortable for day use, but she was not recommended to wear this one at night.  OT also reviews HEP with her and she is comfortable with current plan and states all orthotics fit very well now.     PATIENT EDUCATION: Education details: See tx section above for details  Person educated: Patient Education method: Verbal Instruction, Teach back, Handouts  Education comprehension: States and demonstrates understanding, Additional Education required      HOME EXERCISE PROGRAM: See tx section above for details    GOALS: Goals reviewed with patient? Yes     SHORT TERM GOALS: (STG required if POC>30 days)   Pt will obtain protective, custom orthotic. Target date: 01/21/22 Goal status: MET   2.  Pt will demo/state understanding of initial HEP to improve pain levels and prerequisite motion. Target date: 01/29/22 Goal status: INITIAL     LONG TERM GOALS:   Pt will improve functional ability by decreased impairment per PSFS assessment from 4.6 to 7 or better, for better quality of life. Target date: 03/19/22 Goal status: INITIAL   2.  Pt will improve grip strength in right hand to at least 40lbs for functional use at home and in  IADLs. Target date: 03/19/22  Goal status: INITIAL   3.  Pt will improve A/ROM in right MF AROM flexion/extension from unable to actively extend after flexion to full functional flexion (at least 220*) and extension (0* at all joints), to have functional motion for tasks like reach and grasp.  Target date: 03/19/22 Goal status: INITIAL   4.  Pt will decrease pain at worst from 4/10 to 2/10 or better to have better sleep and occupational participation in daily roles. Target date: 03/19/22 Goal status: INITIAL     ASSESSMENT:   CLINICAL IMPRESSION: 01/27/22: Due to fragile skin and hx of skin tears the orthotic process has been complicated. She now has several options including RMOs, protective gloves with immobilization orthotic as well. She states understanding NWB, HEP and POC and will f/u in 3 weeks to determine healing. She can come in sooner for any pain/new issues/adjustments.     PLAN: OT FREQUENCY: 1-2x/week   OT DURATION: 8 weeks (through 03/19/22)   PLANNED INTERVENTIONS: self care/ADL training, therapeutic exercise, therapeutic activity, neuromuscular re-education, manual therapy, scar mobilization, passive range of motion, splinting, ultrasound, fluidotherapy, compression bandaging, moist heat, cryotherapy, contrast bath, patient/family education, coping strategies training, and Re-evaluation   RECOMMENDED OTHER SERVICES: none now    CONSULTED AND AGREED WITH PLAN OF CARE: Patient   PLAN FOR NEXT SESSION:  Check orthotics, motion, and in 3 weeks, check healing of sagittal bands, tolerance to upgraded HEP, etc.    Benito Mccreedy, OTR/L, CHT 01/27/2022, 10:51 AM

## 2022-02-05 ENCOUNTER — Encounter: Payer: Self-pay | Admitting: *Deleted

## 2022-02-10 ENCOUNTER — Ambulatory Visit: Payer: Medicare Other | Admitting: Orthopaedic Surgery

## 2022-02-12 ENCOUNTER — Other Ambulatory Visit (INDEPENDENT_AMBULATORY_CARE_PROVIDER_SITE_OTHER): Payer: Medicare Other

## 2022-02-12 ENCOUNTER — Ambulatory Visit (INDEPENDENT_AMBULATORY_CARE_PROVIDER_SITE_OTHER): Payer: Medicare Other | Admitting: Gastroenterology

## 2022-02-12 ENCOUNTER — Encounter: Payer: Self-pay | Admitting: Gastroenterology

## 2022-02-12 VITALS — BP 150/80 | HR 93 | Ht 61.5 in | Wt 150.0 lb

## 2022-02-12 DIAGNOSIS — D509 Iron deficiency anemia, unspecified: Secondary | ICD-10-CM

## 2022-02-12 DIAGNOSIS — R110 Nausea: Secondary | ICD-10-CM | POA: Diagnosis not present

## 2022-02-12 DIAGNOSIS — R1319 Other dysphagia: Secondary | ICD-10-CM

## 2022-02-12 DIAGNOSIS — K861 Other chronic pancreatitis: Secondary | ICD-10-CM

## 2022-02-12 DIAGNOSIS — D8989 Other specified disorders involving the immune mechanism, not elsewhere classified: Secondary | ICD-10-CM

## 2022-02-12 DIAGNOSIS — D84821 Immunodeficiency due to drugs: Secondary | ICD-10-CM

## 2022-02-12 DIAGNOSIS — Z1211 Encounter for screening for malignant neoplasm of colon: Secondary | ICD-10-CM

## 2022-02-12 DIAGNOSIS — Z7952 Long term (current) use of systemic steroids: Secondary | ICD-10-CM

## 2022-02-12 DIAGNOSIS — E2749 Other adrenocortical insufficiency: Secondary | ICD-10-CM

## 2022-02-12 DIAGNOSIS — D8984 IgG4-related disease: Secondary | ICD-10-CM

## 2022-02-12 DIAGNOSIS — R1310 Dysphagia, unspecified: Secondary | ICD-10-CM

## 2022-02-12 DIAGNOSIS — K219 Gastro-esophageal reflux disease without esophagitis: Secondary | ICD-10-CM

## 2022-02-12 LAB — B12 AND FOLATE PANEL
Folate: 17.2 ng/mL (ref >5.9)
Vitamin B-12: >1500 pg/mL — ABNORMAL HIGH (ref 211–911)

## 2022-02-12 LAB — CBC WITH DIFFERENTIAL/PLATELET
Basophils Absolute: 0.2 10*3/uL — ABNORMAL HIGH (ref 0.0–0.1)
Basophils Relative: 1.4 % (ref 0.0–3.0)
Eosinophils Absolute: 0.2 10*3/uL (ref 0.0–0.7)
Eosinophils Relative: 1.3 % (ref 0.0–5.0)
HCT: 37.8 % (ref 36.0–46.0)
Hemoglobin: 11.8 g/dL — ABNORMAL LOW (ref 12.0–15.0)
Lymphocytes Relative: 12.9 % (ref 12.0–46.0)
Lymphs Abs: 1.6 10*3/uL (ref 0.7–4.0)
MCHC: 31.3 g/dL (ref 30.0–36.0)
MCV: 79 fl (ref 78.0–100.0)
Monocytes Absolute: 0.8 10*3/uL (ref 0.1–1.0)
Monocytes Relative: 6.3 % (ref 3.0–12.0)
Neutro Abs: 9.7 10*3/uL — ABNORMAL HIGH (ref 1.4–7.7)
Neutrophils Relative %: 78.1 % — ABNORMAL HIGH (ref 43.0–77.0)
Platelets: 317 10*3/uL (ref 150.0–400.0)
RBC: 4.78 Mil/uL (ref 3.87–5.11)
RDW: 18 % — ABNORMAL HIGH (ref 11.5–15.5)
WBC: 12.4 10*3/uL — ABNORMAL HIGH (ref 4.0–10.5)

## 2022-02-12 LAB — IBC + FERRITIN
Ferritin: 8.9 ng/mL — ABNORMAL LOW (ref 10.0–291.0)
Iron: 30 ug/dL — ABNORMAL LOW (ref 42–145)
Saturation Ratios: 8.5 % — ABNORMAL LOW (ref 20.0–50.0)
TIBC: 351.4 ug/dL (ref 250.0–450.0)
Transferrin: 251 mg/dL (ref 212.0–360.0)

## 2022-02-12 LAB — HIGH SENSITIVITY CRP: CRP, High Sensitivity: 4.17 mg/L (ref 0.000–5.000)

## 2022-02-12 LAB — COMPREHENSIVE METABOLIC PANEL
ALT: 17 U/L (ref 0–35)
AST: 15 U/L (ref 0–37)
Albumin: 3.6 g/dL (ref 3.5–5.2)
Alkaline Phosphatase: 71 U/L (ref 39–117)
BUN: 12 mg/dL (ref 6–23)
CO2: 26 mEq/L (ref 19–32)
Calcium: 9.1 mg/dL (ref 8.4–10.5)
Chloride: 107 mEq/L (ref 96–112)
Creatinine, Ser: 0.95 mg/dL (ref 0.40–1.20)
GFR: 60.59 mL/min (ref >60.00)
Glucose, Bld: 106 mg/dL — ABNORMAL HIGH (ref 70–99)
Potassium: 3.5 mEq/L (ref 3.5–5.1)
Sodium: 142 mEq/L (ref 135–145)
Total Bilirubin: 0.4 mg/dL (ref 0.2–1.2)
Total Protein: 7 g/dL (ref 6.0–8.3)

## 2022-02-12 LAB — LIPASE: Lipase: 2 U/L — ABNORMAL LOW (ref 11.0–59.0)

## 2022-02-12 LAB — SEDIMENTATION RATE: Sed Rate: 84 mm/hr — ABNORMAL HIGH (ref 0–30)

## 2022-02-12 LAB — AMYLASE: Amylase: 18 U/L — ABNORMAL LOW (ref 27–131)

## 2022-02-12 MED ORDER — ONDANSETRON HCL 4 MG PO TABS: 4.0000 mg | ORAL_TABLET | Freq: Three times a day (TID) | ORAL | 6 refills | Status: DC | PRN

## 2022-02-12 NOTE — Patient Instructions (Addendum)
You have been scheduled for a follow up with Dr Rush Landmark on 04/21/22 at 9:50 am.  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 If you have not received an appointment within the next 1 month, please let us know.  Your provider has requested that you go to the basement level for lab work before leaving today. Press "B" on the elevator. The lab is located at the first door on the left as you exit the elevator.  _______________________________________________________  If you are age 2 or older, your body mass index should be between 23-30. Your Body mass index is 27.88 kg/m. If this is out of the aforementioned range listed, please consider follow up with your Primary Care Provider.  If you are age 44 or younger, your body mass index should be between 19-25. Your Body mass index is 27.88 kg/m. If this is out of the aformentioned range listed, please consider follow up with your Primary Care Provider.   ________________________________________________________  The [facility] providers would like to encourage you to use [facility] to communicate with providers for non-urgent requests or questions.  Due to long hold times on the telephone, sending your provider a message by [facility] may be a faster and more efficient way to get a response.  Please allow 48 business hours for a response.  Please remember that this is for non-urgent requests.  _______________________________________________________  Due to recent changes in healthcare laws, you may see the results of your imaging and laboratory studies on MyChart before your provider has had a chance to review them.  We understand that in some cases there may be results that are confusing or concerning to you. Not all laboratory results come back in the same time frame and the provider may be waiting for multiple results in order to interpret others.  Please give [place] 48 hours in order for your provider to thoroughly review all  the results before contacting the office for clarification of your results.

## 2022-02-12 NOTE — Progress Notes (Signed)
[facility] VISIT   Primary Care Provider [name], MD [place] [address] [phone]  Referring Provider [name], MD [address] [place],  [place] [address] [phone]  Patient Profile: Debra Leach is a 71 y.o. 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  The patient presents to the [facility] for an evaluation and management of problem(s) noted below:  Problem List 1. 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HCC)   2. 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 Nausea without vomiting   4. Esophageal dysphagia   5. Gastroesophageal reflux disease without esophagitis   6. Iron deficiency anemia, unspecified iron deficiency anemia type   7. 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[place])   8. Colon cancer screening     History of Present Illness This is the patient's first visit to the outpatient [place] clinic.  She has been followed for years by Dr. Earlean Shawl of [place].  Dr. Earlean Shawl has retired this summer and she is transitioning her care to the [place] team.  Her most interesting history begins back in 2019 when she started experiencing nausea and vomiting.  She was having some anorexia as well.  Eventually work-up led to potential finding of a pancreatic mass.  The patient was referred to [facility] for EUS in early 2019.  A biopsy was performed of the pancreas mass and this returned showing a dense plasmacytic cell infiltrate with IgG4 positive staining.  Her serum IgG4 was 1113 at that time.  She was initiated on steroids.  Over the course of the ensuing years she had attempts at tapering her prednisone without great success and needs of increasing her prednisone once again.  In 2021 a MRI was obtained that suggested a  abrupt cut off in the pancreatic duct with an area of abnormality suggestive of a mass.  A repeat EUS was performed in 2021.  Which once again showed evidence of fragmented pancreatic tissue with chronic inflammation and fibrotic changes.  IgG4 positive cells were noted in some areas of the biopsy with approximately 50% of the IgG positive cells being positive for IgG4.  The patient was transitioned from prednisone to budesonide 9 mg daily.  Over the course of the last 2 years she has been able to down titrate her budesonide to 6 mg and at 1.23 mg but when she was at 3 mg daily she experienced significant nausea as and vomiting symptoms that led her to need to increase this back on the discussion with Dr. Earlean Shawl.  She recently underwent repeat MRI/MRCP with no finding of any mass or lesion but pancreatic atrophy was now present throughout.  Her IgG4 levels still remain elevated though not to the extent as when she was diagnosed.  The patient has experienced episodes of nausea without vomiting over the course of the last few weeks.  She has not had any alteration of her bowel habits and actually has relatively formed stools regularly.  She has not noted any blood in her stools.  She has not had any significant weight loss.  Steroid sparing agents have never been used in this patient.  Rituximab has never been discussed with the patient.  In May of this year she was found to be iron deficient based on her iron indices.  She is not taking oral iron currently.  The  patient has undergone EGD in the past for issues of dysphagia.  She feels that at times she is experience issues with dysphagia and had good effect when she had a previous dilation though her last EGD does not report that as being an issue.  She does not feel that she needs an upper endoscopy being repeated as of yet but can certainly let us know.  She has a reported last colonoscopy in 2022 and reports a 5-year was recommended by Dr. Earlean Shawl even though no  polyps were found.  Unfortunately we do not have access to those records.  GI Review of Systems Positive as above Negative for odynophagia, pain, early satiety  Review of Systems General: Denies fevers/chills/weight loss unintentionally Cardiovascular: Denies chest pain Pulmonary: Denies shortness of breath Gastroenterological: See HPI Genitourinary: Denies darkened urine Hematological: Denies easy bruising/bleeding Dermatological: Denies jaundice Psychological: Mood is stable   Medications Current Outpatient Medications  Medication Sig Dispense Refill   atorvastatin (LIPITOR) 80 MG tablet Take 1 tablet (80 mg total) by mouth at bedtime.     Blood Glucose Monitoring Suppl (ONETOUCH VERIO REFLECT) w/Device KIT 2 (two) times daily. for testing     budesonide (ENTOCORT EC) 3 MG 24 hr capsule Take 2 capsules (6 mg total) by mouth daily. 60 capsule 2   Cholecalciferol (VITAMIN D3) 1.25 MG (50000 UT) CAPS Take 1 capsule by mouth once a week. 12 capsule 3   colesevelam (WELCHOL) 625 MG tablet TAKE 3 TABLETS BY MOUTH 2 TIMES DAILY WITH A MEAL. 540 tablet 3   ELIQUIS 2.5 MG TABS tablet TAKE 1 TABLET BY MOUTH 2 TIMES DAILY. 180 tablet 0   Eptinezumab-jjmr (VYEPTI) 100 MG/ML injection Inject 300 mg into the vein every 3 (three) months.     gabapentin (NEURONTIN) 100 MG capsule TAKE 1 CAPSULE BY MOUTH UP TO 3 TIMES DAILY AS NEEDED. 270 capsule 3   gabapentin (NEURONTIN) 300 MG capsule TAKE 2 CAPSULES BY MOUTH AT BEDTIME. 180 capsule 0   glucose blood (ONETOUCH VERIO) test strip USE TO CHECK BLOOD SUGAR TWO TIMES A DAY 200 strip 3   insulin glargine (LANTUS SOLOSTAR) 100 UNIT/ML Solostar Pen Inject 40 Units into the skin daily. 45 mL 3   Insulin Pen Needle (ULTICARE SHORT PEN NEEDLES) 31G X 8 MM MISC USE TO INJECT INSULIN DAILY 100 each 3   Lancets (ONETOUCH DELICA PLUS [id number]) MISC USE TO CHECK BLOOD SUGAR TWO TIMES DAILY 200 each 3   omeprazole (PRILOSEC) 20 MG capsule Take 20 mg by mouth.  before breakfast and bedtime     potassium chloride SA (KLOR-CON M) 20 MEQ tablet TAKE 1 TABLET BY MOUTH DAILY. 90 tablet 1   repaglinide (PRANDIN) 0.5 MG tablet Take 2 tablets (1 mg total) by mouth daily before lunch. before breakfast and lunch (Patient taking differently: Take 1 mg by mouth daily before lunch.) 180 tablet 1   UBRELVY 100 MG TABS TAKE 1 TABLET BY MOUTH AS NEEDED (MAY REPEAT DOSE AFTER 2 HOURS IF NEEDED. MAXIMUM 2 TABLETS IN 24 HOURS). 10 tablet 3   vitamin B-12 (CYANOCOBALAMIN) 1000 MCG tablet Take 1 tablet by mouth daily.     ondansetron (ZOFRAN) 4 MG tablet Take 1 tablet (4 mg total) by mouth every 8 (eight) hours as needed for nausea or vomiting. 20 tablet 6   No current facility-administered medications for this visit.    Allergies Allergies  Allergen Reactions   Sudafed [Pseudoephedrine Hcl] Hives, Itching and Anxiety   Atenolol  Codeine     REACTION: Migraine   Epinephrine Hives   Ibuprofen    Jardiance [Empagliflozin]     Caused uti   Penicillins Itching    Has patient had a PCN reaction causing immediate rash, facial/tongue/throat swelling, SOB or lightheadedness with hypotension: No Has patient had a PCN reaction causing severe rash involving mucus membranes or skin necrosis: No Has patient had a PCN reaction that required hospitalization: No Has patient had a PCN reaction occurring within the last 10 years: No If all of the above answers are "NO", then may proceed with Cephalosporin use.    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[place])    Clotting disorder (HCC)    Complication of  anesthesia    difficult waking   Diabetes mellitus without complication (HCC)    Diet and exercise controlled   Diverticulosis    DVT (deep venous thrombosis) ([place]) 02/2019   3calf 2 right lung 1 left lung   DVT (deep venous thrombosis) ([place]) 2020   right leg   Dyspnea    GERD (gastroesophageal reflux disease)    Headache    Hepatic steatosis    Hiatal hernia    History of kidney stones    Hyperlipidemia    Hypertension    MVP (mitral valve prolapse)    history of   PONV (postoperative nausea and vomiting)    Pulmonary emboli ([place]) 03/01/2019   2 in the right; 1 in the left   Past Surgical History:  Procedure Laterality Date   arm surgery     left forearm fracture w/ plates pins screws   ARTERY BIOPSY Right 12/03/2021   Procedure: BIOPSY TEMPORAL ARTERY;  Surgeon: Algernon Huxley, MD;  Location: [facility] ORS;  Service: Vascular;  Laterality: Right;  Right temple   BREAST BIOPSY Left    BREAST SURGERY  2000   breast biopsy (benign)   LITHOTRIPSY  08/12/2008   stent placed bilaterally   PARTIAL HYSTERECTOMY     Both ovaries remain, vaginal, for mennorhagia   TONSILLECTOMY     TUBAL LIGATION  1980   Social History   Socioeconomic History   Marital status: Married    Spouse name: Carloyn Manner   Number of children: 0   Years of education: Not on file   Highest education level: Not on file  Occupational History   Occupation: retired Energy manager: retired  Tobacco Use   Smoking status: Former    Types: Cigarettes   Smokeless tobacco: Never  Vaping Use   Vaping Use: Never used  Substance and Sexual Activity   Alcohol use: No   Drug use: No   Sexual activity: Not on file  Other Topics Concern   Not on file  Social History Narrative   Regular exercise--yes, recumbent bike 3-4 days a week      Diet: fruits and veggies, water, eats at home, drinks a lot of milk      Patient is right-handed. She drinks 1-2 cups of coffee a day.      One story  home   Social  Determinants of Health   Financial Resource Strain: Low Risk  (11/20/2019)   Overall Financial Resource Strain (CARDIA)    Difficulty of Paying Living Expenses: Not hard at all  Food Insecurity: No Food Insecurity (04/06/2019)   Hunger Vital Sign    Worried About Running Out of Food in the Last Year: Never true    Ran Out of Food in the Last Year: Never true  Transportation Needs: No Transportation Needs (04/06/2019)   PRAPARE - Hydrologist (Medical): No    Lack of Transportation (Non-Medical): No  Physical Activity: Inactive (04/06/2019)   Exercise Vital Sign    Days of Exercise per Week: 0 days    Minutes of Exercise per Session: 0 min  Stress: No Stress Concern Present (04/06/2019)   [place]    Feeling of Stress : Not at all  Social Connections: Not on file  Intimate Partner Violence: Not At Risk (04/06/2019)   Humiliation, Afraid, Rape, and Kick questionnaire    Fear of Current or Ex-Partner: No    Emotionally Abused: No    Physically Abused: No    Sexually Abused: No   Family History  Problem Relation Age of Onset   Cancer Mother        bone   Hypertension Father    Mitral valve prolapse Father    Asthma Brother    Arthritis Brother    Nephrolithiasis Brother    Nephrolithiasis Brother    Asthma Brother    Arthritis Brother    Aortic aneurysm Brother        ascending aortic aneuysm   Breast cancer Maternal Aunt    Breast cancer Maternal Aunt    Colon cancer Neg Hx    Esophageal cancer Neg Hx    Inflammatory bowel disease Neg Hx    Liver disease Neg Hx    Pancreatic cancer Neg Hx    Rectal cancer Neg Hx    Stomach cancer Neg Hx    I have reviewed her medical, social, and family history in detail and updated the electronic medical record as necessary.    PHYSICAL EXAMINATION  BP (!) 150/80   Pulse 93   Ht 5' 1.5" (1.562 m)   Wt 150 lb (68 kg)   BMI 27.88  kg/m  Wt Readings from Last 3 Encounters:  02/12/22 150 lb (68 kg)  01/15/22 150 lb (68 kg)  12/17/21 151 lb (68.5 kg)  GEN: NAD, appears stated age, doesn't appear chronically ill PSYCH: Cooperative, without pressured speech EYE: Conjunctivae pink, sclerae anicteric ENT: MMM CV: Nontachycardic RESP: No audible wheezing GI: NABS, soft, NT/ND, without rebound or guarding MSK/EXT: No pedal edema present SKIN: No jaundice NEURO:  Alert & Oriented x 3, no focal deficits   REVIEW OF DATA  I reviewed the following data at the time of this encounter:  GI Procedures and Studies  We will need to try to obtain the actual reports as we cannot view them and they are not included in the records today. Based on Dr. Liliane Channel notation he states that the 2022 EGD showed a 4 cm hernia and was otherwise normal.  The 2022 colonoscopy is reported as normal.  Laboratory Studies  Reviewed those in epic and care everywhere  May 2023 iron labs show evidence of an iron saturation of 7% with a ferritin of 7 iron/TIBC 27/398 with a CBC hemoglobin of 11.6 and  MCV of 76.3  Imaging Studies  June 2023 MRI abdomen/MRCP IMPRESSION: 1. Severe pancreatic atrophy with no acute process or pancreatic mass identified. 2. Hepatic steatosis. 3. Colonic diverticulosis. 4. Small hiatal hernia. 5. Chronic scarring changes in the kidneys.   ASSESSMENT  Ms. Mcbryar is a 71 y.o. 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  The patient is seen today for evaluation and management of:  1. 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HCC)   2. 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 Nausea without vomiting   4. Esophageal dysphagia   5. Gastroesophageal reflux disease without esophagitis   6. Iron deficiency anemia, unspecified iron deficiency anemia type   7. Immunosuppression due to  chronic steroid use ([place])   8. Colon cancer screening    The patient is hemodynamically stable.  From a clinical standpoint as well she seems to be doing relatively well with her known autoimmune pancreatitis though she is experiencing some intermittent episodes of nausea (a symptom for her in the past that has suggested active pancreatitis issues).  She is also experienced some infrequent episodes of dysphagia and has had previous endoscopic dilations per her report that have been helpful for her although she also has a large hiatal hernia that could be playing a role with symptoms as well.  In regards to her autoimmune pancreatitis, we are going to keep the status quo currently until we get her into our system and have a little bit better assessment of her overall.  I do think that there will be some potential role of trying to use immunomodulators to decrease her overall, long-term steroid use.  Time will tell.  The other question is whether there is any role to consider Rituximab since she has never been able to normalize her IgG4 levels.  We are going to get some laboratories today and also get a fecal elastase to see where things stand with her now very atrophic appearing pancreas.  She states that she was placed on a 5-year recall for colonoscopy after her last 1 in 2022 so she will be placed into the recall system for 2027 unless other issues develop.  If dysphagia symptoms progress she will need an EGD, but she wants to wait on this currently and let us know if something develops or changes.  All patient questions were answered to the best of my ability, and the patient agrees to the aforementioned plan of action with follow-up as indicated.   PLAN  Laboratories as outlined below Fecal elastase to be obtained H. pylori stool antigen to be obtained Continue budesonide 6 mg daily Consider azathioprine/6-MP in future to immunomodulators and decrease overall steroid use Consider role of Rituximab in  future Repeat MRI/MRCP in 6/24 (yearly follow-up for her in the setting of her significant disease) Rheumatology referral to be placed to evaluate and ensure no other IgG4 related issues are at play for her in the setting of her arthritic issues that have been previously described as just osteoarthritis   Orders Placed This Encounter  Procedures   Helicobacter pylori special antigen   CBC with Differential/Platelet   Comp Met (CMET)   Amylase   Lipase   Sed Rate (ESR)   CRP High sensitivity   IBC + Ferritin   B12 and Folate Panel   IgG 4   Hepatitis B core antibody, total   Pancreatic elastase, fecal   Thiopurine methyltransferase(tpmt)rbc  Ambulatory referral to Rheumatology    New Prescriptions   No medications on file   Modified Medications   Modified Medication Previous Medication   ONDANSETRON (ZOFRAN) 4 MG TABLET ondansetron (ZOFRAN) 4 MG tablet      Take 1 tablet (4 mg total) by mouth every 8 (eight) hours as needed for nausea or vomiting.    Take 1 tablet (4 mg total) by mouth every 8 (eight) hours as needed for nausea or vomiting.    Planned Follow Up No follow-ups on file.   Total Time in Face-to-Face and in Coordination of Care for patient including independent/personal interpretation/review of prior testing, medical history, examination, medication adjustment, communicating results with the patient directly, and documentation within the EHR is 60 minutes.   Justice Britain, MD [facility] Office # [phone]

## 2022-02-13 ENCOUNTER — Encounter: Payer: Self-pay | Admitting: Gastroenterology

## 2022-02-13 DIAGNOSIS — R110 Nausea: Secondary | ICD-10-CM | POA: Insufficient documentation

## 2022-02-13 DIAGNOSIS — D509 Iron deficiency anemia, unspecified: Secondary | ICD-10-CM | POA: Insufficient documentation

## 2022-02-13 DIAGNOSIS — Z7952 Long term (current) use of systemic steroids: Secondary | ICD-10-CM | POA: Insufficient documentation

## 2022-02-13 NOTE — Progress Notes (Incomplete)
[place] VISIT   Primary Care Provider [name], MD [place] [address] [phone]  Referring Provider [name], MD [address] [place],  [place] [address] [phone]  Patient Profile: Debra Leach is a 71 y.o. 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  The patient presents to the [facility] for an evaluation and management of problem(s) noted below:  Problem List 1. 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[place])   2. Nausea without vomiting   3. Esophageal dysphagia   4. Gastroesophageal reflux disease without esophagitis   5. Iron deficiency anemia, unspecified iron deficiency anemia type   6. Immunosuppression due to chronic steroid use [facility])     History of Present Illness  June 2023 MRI/MRCP IMPRESSION: 1. Severe pancreatic atrophy with no acute process or pancreatic mass identified. 2. Hepatic steatosis. 3. Colonic diverticulosis. 4. Small hiatal hernia. 5. Chronic scarring changes in the kidneys.  2022 EGD 4 cm hiatal hernia (34 to 38 cm) Normal EGD exam otherwise  2022 colonoscopy Reported normal though I do not have this access   May 2023 iron labs show evidence of an iron saturation of 7% with a ferritin of 7 iron/TIBC 27/398 with a CBC hemoglobin of 11.6 and MCV of 76.3  IgG4     The patient does/does not take NSAIDs or BC/Goody Powder. Patient has/has not had an EGD. Patient has/has not had a Colonoscopy.  GI Review of Systems Positive as above Negative for  Pyrosis; Reflux; Regurgitation; Dysphagia; Odynophagia; Globus; Post-prandial cough; Nocturnal cough; Nasal regurgitation; Epigastric pain; Nausea; Vomiting; Hematemesis; Jaundice; Change in Appetite; Early satiety; Abdominal pain; Abdominal  bloating; Eructation; Flatulence; Change in BM Frequency; Change in BM Consistency; Constipation; Diarrhea; Incontinence; Urgency; Tenesmus; Hematochezia; Melena  Review of Systems General: Denies fevers/chills/weight loss/night sweats HEENT: Denies oral lesions/sore throat/headaches/visual changes Cardiovascular: Denies chest pain/palpitations Pulmonary: Denies shortness of breath/cough Gastroenterological: See HPI Genitourinary: Denies darkened urine or hematuria Hematological: Denies easy bruising/bleeding Endocrine: Denies temperature intolerance Dermatological: Denies skin changes Psychological: Mood is stable Allergy & Immunology: Denies severe allergic reactions Musculoskeletal: Denies new arthralgias   Medications Current Outpatient Medications  Medication Sig Dispense Refill  . atorvastatin (LIPITOR) 80 MG tablet Take 1 tablet (80 mg total) by mouth at bedtime.    . Blood Glucose Monitoring Suppl (ONETOUCH VERIO REFLECT) w/Device KIT 2 (two) times daily. for testing    . budesonide (ENTOCORT EC) 3 MG 24 hr capsule Take 2 capsules (6 mg total) by mouth daily. 60 capsule 2  . Cholecalciferol (VITAMIN D3) 1.25 MG (50000 UT) CAPS Take 1 capsule by mouth once a week. 12 capsule 3  . colesevelam (WELCHOL) 625 MG tablet TAKE 3 TABLETS BY MOUTH 2 TIMES DAILY WITH A MEAL. 540 tablet 3  . ELIQUIS 2.5 MG TABS tablet TAKE 1 TABLET BY MOUTH 2 TIMES DAILY. 180 tablet 0  . Eptinezumab-jjmr (VYEPTI) 100 MG/ML injection Inject 300 mg into the vein every 3 (three) months.    . gabapentin (NEURONTIN) 100 MG capsule TAKE 1 CAPSULE BY MOUTH UP TO 3 TIMES DAILY AS NEEDED. 270 capsule 3  . gabapentin (NEURONTIN) 300 MG capsule TAKE 2 CAPSULES BY MOUTH AT BEDTIME. 180 capsule 0  . glucose blood (ONETOUCH VERIO) test strip USE TO CHECK BLOOD SUGAR TWO TIMES A DAY 200 strip 3  . insulin glargine (LANTUS SOLOSTAR) 100 UNIT/ML Solostar  Pen Inject 40 Units into the skin daily. 45 mL 3  . Insulin Pen  Needle (ULTICARE SHORT PEN NEEDLES) 31G X 8 MM MISC USE TO INJECT INSULIN DAILY 100 each 3  . Lancets (ONETOUCH DELICA PLUS [id number]) MISC USE TO CHECK BLOOD SUGAR TWO TIMES DAILY 200 each 3  . omeprazole (PRILOSEC) 20 MG capsule Take 20 mg by mouth. before breakfast and bedtime    . potassium chloride SA (KLOR-CON M) 20 MEQ tablet TAKE 1 TABLET BY MOUTH DAILY. 90 tablet 1  . repaglinide (PRANDIN) 0.5 MG tablet Take 2 tablets (1 mg total) by mouth daily before lunch. before breakfast and lunch (Patient taking differently: Take 1 mg by mouth daily before lunch.) 180 tablet 1  . UBRELVY 100 MG TABS TAKE 1 TABLET BY MOUTH AS NEEDED (MAY REPEAT DOSE AFTER 2 HOURS IF NEEDED. MAXIMUM 2 TABLETS IN 24 HOURS). 10 tablet 3  . vitamin B-12 (CYANOCOBALAMIN) 1000 MCG tablet Take 1 tablet by mouth daily.    . ondansetron (ZOFRAN) 4 MG tablet Take 1 tablet (4 mg total) by mouth every 8 (eight) hours as needed for nausea or vomiting. 20 tablet 6   No current facility-administered medications for this visit.    Allergies Allergies  Allergen Reactions  . Sudafed [Pseudoephedrine Hcl] Hives, Itching and Anxiety  . Atenolol   . Codeine     REACTION: Migraine  . Epinephrine Hives  . Ibuprofen   . Jardiance [Empagliflozin]     Caused uti  . Penicillins Itching    Has patient had a PCN reaction causing immediate rash, facial/tongue/throat swelling, SOB or lightheadedness with hypotension: No Has patient had a PCN reaction causing severe rash involving mucus membranes or skin necrosis: No Has patient had a PCN reaction that required hospitalization: No Has patient had a PCN reaction occurring within the last 10 years: No If all of the above answers are "NO", then may proceed with Cephalosporin use.   Marland Kitchen Pentazocine Lactate     REACTION: Swelling, itching, rash  . Promethazine Other (See Comments)    migraine  . Propranolol Other (See Comments)  . Talwin [Pentazocine] Other (See Comments)    Swelling and  itching   . Crestor [Rosuvastatin] Other (See Comments)    Histories Past Medical History:  Diagnosis Date  . Anemia   . Arthritis    osteoarthritis B knees, left hip , right elbow  . 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[place])   . Clotting disorder ([place])   . Complication of anesthesia    difficult waking  . Diabetes mellitus without complication ([place])    Diet and exercise controlled  . Diverticulosis   . DVT (deep venous thrombosis) ([place]) 02/2019   3calf 2 right lung 1 left lung  . DVT (deep venous thrombosis) ([place]) 2020   right leg  . Dyspnea   . GERD (gastroesophageal reflux disease)   . Headache   . Hepatic steatosis   . Hiatal hernia   . History of kidney stones   . Hyperlipidemia   . Hypertension   . MVP (mitral valve prolapse)    history of  . PONV (postoperative nausea and vomiting)   . Pulmonary emboli ([place]) 03/01/2019   2 in the right; 1 in the left   Past Surgical History:  Procedure Laterality Date  . arm surgery     left forearm fracture w/ plates pins screws  . ARTERY BIOPSY Right 12/03/2021   Procedure: BIOPSY TEMPORAL ARTERY;  Surgeon: Algernon Huxley, MD;  Location: [facility] ORS;  Service: Vascular;  Laterality: Right;  Right temple  . BREAST BIOPSY Left   . BREAST SURGERY  2000   breast biopsy (benign)  . LITHOTRIPSY  08/12/2008   stent placed bilaterally  . PARTIAL HYSTERECTOMY     Both ovaries remain, vaginal, for mennorhagia  . TONSILLECTOMY    . TUBAL LIGATION  1980   Social History   Socioeconomic History  . Marital status: Married    Spouse name: Carloyn Manner  . Number of children: 0  . Years of education: Not on file  . Highest education level: Not on file  Occupational History  . Occupation: retired Energy manager: retired  Tobacco Use  . Smoking status: Former    Types: Cigarettes  . Smokeless tobacco: Never  Vaping Use  . Vaping Use: Never used  Substance and Sexual Activity  . Alcohol use: No  . Drug use: No  . Sexual activity: Not on  file  Other Topics Concern  . Not on file  Social History Narrative   Regular exercise--yes, recumbent bike 3-4 days a week      Diet: fruits and veggies, water, eats at home, drinks a lot of milk      Patient is right-handed. She drinks 1-2 cups of coffee a day.      One story home   Social Determinants of Health   Financial Resource Strain: Low Risk  (11/20/2019)   Overall Financial Resource Strain (CARDIA)   . Difficulty of Paying Living Expenses: Not hard at all  Food Insecurity: No Food Insecurity (04/06/2019)   Hunger Vital Sign   . Worried About Charity fundraiser in the Last Year: Never true   . Ran Out of Food in the Last Year: Never true  Transportation Needs: No Transportation Needs (04/06/2019)   PRAPARE - Transportation   . Lack of Transportation (Medical): No   . Lack of Transportation (Non-Medical): No  Physical Activity: Inactive (04/06/2019)   Exercise Vital Sign   . Days of Exercise per Week: 0 days   . Minutes of Exercise per Session: 0 min  Stress: No Stress Concern Present (04/06/2019)   [place]   . Feeling of Stress : Not at all  Social Connections: Not on file  Intimate Partner Violence: Not At Risk (04/06/2019)   Humiliation, Afraid, Rape, and Kick questionnaire   . Fear of Current or Ex-Partner: No   . Emotionally Abused: No   . Physically Abused: No   . Sexually Abused: No   Family History  Problem Relation Age of Onset  . Cancer Mother        bone  . Hypertension Father   . Mitral valve prolapse Father   . Asthma Brother   . Arthritis Brother   . Nephrolithiasis Brother   . Nephrolithiasis Brother   . Asthma Brother   . Arthritis Brother   . Aortic aneurysm Brother        ascending aortic aneuysm  . Breast cancer Maternal Aunt   . Breast cancer Maternal Aunt   . Colon cancer Neg Hx   . Esophageal cancer Neg Hx   . Inflammatory bowel disease Neg Hx   . Liver  disease Neg Hx   . Pancreatic cancer Neg Hx   . Rectal cancer Neg Hx   . Stomach cancer Neg Hx    I have reviewed her medical, social, and family history in detail  and updated the electronic medical record as necessary.    PHYSICAL EXAMINATION  BP (!) 150/80   Pulse 93   Ht 5' 1.5" (1.562 m)   Wt 150 lb (68 kg)   BMI 27.88 kg/m  Wt Readings from Last 3 Encounters:  02/12/22 150 lb (68 kg)  01/15/22 150 lb (68 kg)  12/17/21 151 lb (68.5 kg)   GEN: NAD, appears stated age, doesn't appear chronically ill PSYCH: Cooperative, without pressured speech EYE: Conjunctivae pink, sclerae anicteric ENT: MMM, without oral ulcers, no erythema or exudates noted NECK: Supple CV: RR without R/Gs  RESP: CTAB posteriorly, without wheezing GI: NABS, soft, NT/ND, without rebound or guarding, no HSM appreciated GU: DRE shows MSK/EXT: _ edema, no palmar erythema SKIN: No jaundice, no spider angiomata, no concerning rashes NEURO:  Alert & Oriented x 3, no focal deficits, no evidence of asterixis   REVIEW OF DATA  I reviewed the following data at the time of this encounter:  GI Procedures and Studies  We will need to try to obtain the actual reports as we cannot view them and they are not included in the records today. Based on Dr. Liliane Channel notation he states that the 2022 EGD showed a 4 cm hernia and was otherwise normal.  The 2022 colonoscopy is reported as normal.  Laboratory Studies  Reviewed those in epic and care everywhere  Imaging Studies  June 2023 MRI abdomen/MRCP IMPRESSION: 1. Severe pancreatic atrophy with no acute process or pancreatic mass identified. 2. Hepatic steatosis. 3. Colonic diverticulosis. 4. Small hiatal hernia. 5. Chronic scarring changes in the kidneys.   ASSESSMENT  Debra Leach is a 71 y.o. female with a pmh significant for The patient is seen today for evaluation and management of:  1. 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[place])   2. Nausea without vomiting   3.  Esophageal dysphagia   4. Gastroesophageal reflux disease without esophagitis   5. Iron deficiency anemia, unspecified iron deficiency anemia type   6. 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[facility])     ***   PLAN  There are no diagnoses linked to this encounter.   Orders Placed This Encounter  Procedures  . Helicobacter pylori special antigen  . CBC with Differential/Platelet  . Comp Met (CMET)  . Amylase  . Lipase  . Sed Rate (ESR)  . CRP High sensitivity  . IBC + Ferritin  . B12 and Folate Panel  . IgG 4  . Hepatitis B core antibody, total  . Pancreatic elastase, fecal  . Thiopurine methyltransferase(tpmt)rbc  . Ambulatory referral to Rheumatology    New Prescriptions   No medications on file   Modified Medications   Modified Medication Previous Medication   ONDANSETRON (ZOFRAN) 4 MG TABLET ondansetron (ZOFRAN) 4 MG tablet      Take 1 tablet (4 mg total) by mouth every 8 (eight) hours as needed for nausea or vomiting.    Take 1 tablet (4 mg total) by mouth every 8 (eight) hours as needed for nausea or vomiting.    Planned Follow Up No follow-ups on file.   Total Time in Face-to-Face and in Coordination of Care for patient including independent/personal interpretation/review of prior testing, medical history, examination, medication adjustment, communicating results with the patient directly, and documentation within the EHR is ***.   Justice Britain, MD [facility] Office # [phone]

## 2022-02-14 DIAGNOSIS — D8984 IgG4-related disease: Secondary | ICD-10-CM | POA: Insufficient documentation

## 2022-02-14 DIAGNOSIS — D8989 Other specified disorders involving the immune mechanism, not elsewhere classified: Secondary | ICD-10-CM | POA: Insufficient documentation

## 2022-02-14 DIAGNOSIS — Z1211 Encounter for screening for malignant neoplasm of colon: Secondary | ICD-10-CM | POA: Insufficient documentation

## 2022-02-15 ENCOUNTER — Other Ambulatory Visit: Payer: Self-pay

## 2022-02-15 DIAGNOSIS — D509 Iron deficiency anemia, unspecified: Secondary | ICD-10-CM

## 2022-02-15 LAB — HEPATITIS B CORE ANTIBODY, TOTAL: Hep B Core Total Ab: NONREACTIVE

## 2022-02-15 MED ORDER — FERROUS GLUCONATE 324 (38 FE) MG PO TABS: 324.0000 mg | ORAL_TABLET | Freq: Every day | ORAL | 6 refills | Status: DC

## 2022-02-16 ENCOUNTER — Other Ambulatory Visit: Payer: Medicare Other

## 2022-02-16 DIAGNOSIS — R110 Nausea: Secondary | ICD-10-CM

## 2022-02-16 DIAGNOSIS — R1319 Other dysphagia: Secondary | ICD-10-CM

## 2022-02-16 DIAGNOSIS — K861 Other chronic pancreatitis: Secondary | ICD-10-CM

## 2022-02-16 DIAGNOSIS — D509 Iron deficiency anemia, unspecified: Secondary | ICD-10-CM

## 2022-02-16 LAB — IGG 4: IgG, Subclass 4: 234 mg/dL — ABNORMAL HIGH (ref 2–96)

## 2022-02-17 ENCOUNTER — Encounter: Payer: Self-pay | Admitting: Rehabilitative and Restorative Service Providers"

## 2022-02-17 ENCOUNTER — Ambulatory Visit (INDEPENDENT_AMBULATORY_CARE_PROVIDER_SITE_OTHER): Payer: Medicare Other | Admitting: Rehabilitative and Restorative Service Providers"

## 2022-02-17 DIAGNOSIS — M6281 Muscle weakness (generalized): Secondary | ICD-10-CM

## 2022-02-17 DIAGNOSIS — R600 Localized edema: Secondary | ICD-10-CM

## 2022-02-17 DIAGNOSIS — M79641 Pain in right hand: Secondary | ICD-10-CM

## 2022-02-17 DIAGNOSIS — R278 Other lack of coordination: Secondary | ICD-10-CM

## 2022-02-17 DIAGNOSIS — M25641 Stiffness of right hand, not elsewhere classified: Secondary | ICD-10-CM

## 2022-02-17 NOTE — Therapy (Signed)
OUTPATIENT OCCUPATIONAL THERAPY TREATMENT NOTE   Patient Name: Debra Leach MRN: [id number] DOB:[birth year], 71 y.o., female Today's Date: 02/17/2022  PCP: Dr. Eliezer Lofts, MD REFERRING PROVIDER: Dr. Sherilyn Cooter, MD   END OF SESSION:   OT End of Session - 02/17/22 0925     Visit Number 4    Number of Visits 14    Date for OT Re-Evaluation 03/19/22    Authorization Type [employer] Medicare    Progress Note Due on Visit 10    OT Start Time [phone]    OT Stop Time 1005    OT Time Calculation (min) 39 min    Equipment Utilized During Treatment orthotic materials    Activity Tolerance Patient tolerated treatment well;No increased pain;Patient limited by pain;Patient limited by fatigue    Behavior During Therapy [facility] for tasks assessed/performed              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[place])    Clotting disorder (HCC)    Complication of anesthesia    difficult waking   Diabetes mellitus without complication (HCC)    Diet and exercise controlled   Diverticulosis    DVT (deep venous thrombosis) ([place]) 02/2019   3calf 2 right lung 1 left lung   DVT (deep venous thrombosis) ([place]) 2020   right leg   Dyspnea    GERD (gastroesophageal reflux disease)    Headache    Hepatic steatosis    Hiatal hernia    History of kidney stones    Hyperlipidemia    Hypertension    MVP (mitral valve prolapse)    history of   PONV (postoperative nausea and vomiting)    Pulmonary emboli ([place]) 03/01/2019   2 in the right; 1 in the left   Past Surgical History:  Procedure Laterality Date   arm surgery     left forearm fracture w/ plates pins screws   ARTERY BIOPSY Right 12/03/2021   Procedure: BIOPSY TEMPORAL ARTERY;  Surgeon: Algernon Huxley, MD;  Location: [facility] ORS;  Service: Vascular;  Laterality: Right;  Right temple   BREAST BIOPSY Left    BREAST SURGERY  2000   breast  biopsy (benign)   LITHOTRIPSY  08/12/2008   stent placed bilaterally   PARTIAL HYSTERECTOMY     Both ovaries remain, vaginal, for [place]   Patient Active Problem List   Diagnosis Date Noted   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   Colon cancer screening 02/14/2022   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[place]) 02/13/2022   Iron deficiency anemia 02/13/2022   Nausea without vomiting 02/13/2022   Pain in right hand 01/19/2022   Milium 01/15/2022   Abnormal CXR 12/11/2021   Acute right-sided thoracic back pain 12/11/2021   SK (seborrheic keratosis) 11/27/2021   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   Hypokalemia 11/03/2021   Diabetes mellitus with no complication ([place]) 16/96/7893   Vaginal candidiasis 08/06/2021   Hematuria, gross 08/06/2021   Type 2 diabetes mellitus with stage 3a chronic kidney disease, with long-term current use of insulin ([place]) 07/10/2021   Multinodular goiter 07/10/2021   Leg cramps 05/08/2021   Spondylolisthesis at L4-L5 level 04/24/2021   COVID-19 03/23/2021   Thyroid nodule 04/11/2020   AK (actinic keratosis) 09/26/2019   Dyspnea on minimal exertion 07/13/2019   Stuttering 07/13/2019  Hypertension associated with diabetes ([place]) 07/06/2019   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[place]) 07/06/2019   Multiple thyroid nodules 03/27/2019   DVT, lower extremity, proximal, acute, right ([place]) 03/13/2019   History of pulmonary embolism 03/01/2019   Leukocytosis 03/01/2019   Breast nodule 03/01/2019   Abnormal renal finding 06/29/2018   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[place]) 08/16/2017   Facial rash 03/01/2017   Eustachian tube disorder, bilateral 12/16/2016   Lightheadedness 12/16/2016   Esophageal dysphagia 11/22/2016   Chronic low back pain 10/07/2016   Vision changes 08/12/2016   Aortic atherosclerosis ([place]) 06/11/2016   Supraclavicular fossa fullness 06/08/2016   Family history of aortic aneurysm  08/20/2014   Vitamin D deficiency 08/20/2014   Osteopenia 11/06/2009   Hyperlipidemia associated with type 2 diabetes mellitus ([place]) 07/05/2008   INSOMNIA, CHRONIC 07/05/2008   Migraine with aura 07/05/2008   GERD 07/05/2008   OSTEOARTHRITIS 07/05/2008   MITRAL VALVE PROLAPSE, HX OF 07/05/2008   NEPHROLITHIASIS, HX OF 07/05/2008    ONSET DATE: "about a month" (12/18/21 aprox DOI)    REFERRING DIAG: O53.664 (ICD-10-CM) - Pain in right finger(s)  THERAPY DIAG:  Localized edema  Muscle weakness (generalized)  Stiffness of right hand, not elsewhere classified  Pain in right hand  Other lack of coordination  Rationale for Evaluation and Treatment Rehabilitation  PERTINENT HISTORY: Per MD: "Patient will need to be evaluated by OT for relative motion, sagittal band rupture for right middle finger"    PRECAUTIONS: 02/17/22: now ~9 weeks since injury with ~4 weeks of immobilization; attempt short arc AROM now; composite AROM in an additional week as tolerated; AAROM a weeks later (6 weeks post immobilization), PROM a week later; D/C orthotic by 8 weeks as tolerated; Also PmHx of autoimmune disease and has past multiple DVTs and PE (increased risk)    WEIGHT BEARING RESTRICTIONS Yes <5# recommended for 4-6 weeks at least     SUBJECTIVE:  She states having some mild-moderate pain and swelling this past weekend, but states no trauma, etc. and it has gotten better since then. She did use ice, she has been using the different orthotics to good effect.     PAIN:  Are you having pain? Yes Rating: 2/10 at rest now in middle finger    OBJECTIVE: (All objective assessments below are from initial evaluation on: 01/21/22 unless otherwise specified.)   HAND DOMINANCE: Right    ADLs: Overall ADLs: States decreased ability to grab, hold household objects, pain and inability to open containers, perform all FMS tasks, etc.      FUNCTIONAL OUTCOME MEASURES: Eval: Patient Specific Functional  Scale: 4.6 (holding dishes, brushing teeth, open jars)   UPPER EXTREMITY ROM    Eval 01/21/22: Right hand: She makes full fist, but cannot open MF once closed. With self-assist to open she can hold all fingers in full extension (minus some bend in MF CMP J due to  moderate swelling at MCP J).  No significant issues or complaints in proximal arm, wrist, elbow, shoulder, etc., b/l    Active ROM Right TBD  Thumb MCP (0-60)    Thumb IP (0-80)    Thumb Opposition to Small Finger   Long MCP (0-90)    Long PIP (0-100)    Long DIP (0-70)    (Blank rows = not tested)     UPPER EXTREMITY MMT:    Eval: not appropriate in right hand now (pain, weakness, healing ligaments, etc.), but for the most part, b/l proximal UE's seem equal strength b/l,  no complaints.    MMT Right TBD PRN  Elbow flexion    Elbow extension    Wrist flexion    Wrist extension    Wrist ulnar deviation    Wrist radial deviation    Wrist pronation    Wrist supination    (Blank rows = not tested)   HAND FUNCTION: Eval: Grip strength Right: TBD when safe    COORDINATION: Eval: TBD but observably impaired in right hand due to inability to open hand after closing it. Details TBD  Box and Blocks Test: TBD Blocks today (TBD is WFL)   SENSATION: Eval:  Light touch intact today   EDEMA:             Eval:  Moderately swollen in right hand at 3rd MCP J    COGNITION: Overall cognitive status: WFL for evaluation today, memory, recall, awareness, etc. all WNL in casual conversation   OBSERVATIONS:            02/17/22: She tolerates AROM to fingers and wrist, one at a time, with the other held in extension to create "slack" on sagittal bands. She's not TTP and no signs of shifting of EDC with motion today.    Eval: OT can palpate the EDC to MF slipping ulnarly when she flexes.  It is getting "stuck" there, preventing proper mechanics to extend her MCP J.  Pencil test for RMO is successful in that she can extend her middle  finger on her own power, pain is less, and no slippage of EDC is felt.  TTP around swollen 3rd MCP J today, red.      TODAY'S TREATMENT:  02/17/22: Now ~4 weeks using yoke or immobilized, she attempts slow, short arc AROM exercises to the digits with the wrist in extension and separately to the wrist with the digits in extension. She tolerates this well, without pain or EDC shift, so each are added to HEP 3-4 times a day for 25 repetitions. Her orthosis is continued between exercise sessions and at night.  She is also given HEP to last the next 2-3 weeks (she states she will be out of town for weddings and can't attend 1 x each week).  The HEP is as below, and OT demo's how to safely do these things. She states understanding how to safely progress, to continue to limit weight bearing and wear yoke or MCP blocking orthoses.  OT also fabricates a small, training finger extension orthotic to be worn for training wrist motion in the next week, to promote safety.    HEP for next few weeks: next week (around 02/24/22) start composite A/ROM; Around 03/02/22 start AA/ROM and P/ROM the following week; D/C orthotic by 03/16/22 and start grip training as tolerated.    01/27/22: Due to fragile skin and hx of skin tears the orthotic process has been complicated. OT provides lightly compressive isotoner glove for her to wear underneath RMO's but they fit too tightly with glove on, so again OT adjusts 2 RMO's to be a bit loser, and they seem to work better.  Next OT makes new wrist and middle finger immobilization orthotic for night or if other RMOs become painful or intolerable to fragile skin. It blocks MF MCP J in slight hyperext and wrist in slight (~10*) extension, fits well, no rubbing, and she is given sleeve to wear under to prevent skin breakdown.  She states feeling very supported by this brace.  OT goes over HEP for finger motion and  wrist motion in RMO to tolerance and clarifies that wrist motion should be in  tenodesis to prevnt stron pressures on EDC/sagittal band for now. She demo's back with no problems, states understanding.    PATIENT EDUCATION: Education details: See tx section above for details  Person educated: Patient Education method: Verbal Instruction, Teach back, Handouts  Education comprehension: States and demonstrates understanding, Additional Education required      HOME EXERCISE PROGRAM: See tx section above for details    GOALS: Goals reviewed with patient? Yes     SHORT TERM GOALS: (STG required if POC>30 days)   Pt will obtain protective, custom orthotic. Target date: 01/21/22 Goal status: MET   2.  Pt will demo/state understanding of initial HEP to improve pain levels and prerequisite motion. Target date: 01/29/22 Goal status: 02/17/22 MET      LONG TERM GOALS:   Pt will improve functional ability by decreased impairment per PSFS assessment from 4.6 to 7 or better, for better quality of life. Target date: 03/19/22 Goal status: INITIAL   2.  Pt will improve grip strength in right hand to at least 40lbs for functional use at home and in IADLs. Target date: 03/19/22 Goal status: INITIAL   3.  Pt will improve A/ROM in right MF AROM flexion/extension from unable to actively extend after flexion to full functional flexion (at least 220*) and extension (0* at all joints), to have functional motion for tasks like reach and grasp.  Target date: 03/19/22 Goal status: INITIAL   4.  Pt will decrease pain at worst from 4/10 to 2/10 or better to have better sleep and occupational participation in daily roles. Target date: 03/19/22 Goal status: INITIAL     ASSESSMENT:   CLINICAL IMPRESSION: 02/17/22: We will continue to progress as tolerated through typical recovery periods, including: next week (around 02/24/22) start composite A/ROM; Around 03/02/22 start AA/ROM and P/ROM the following week; D/C orthotic by 03/16/22 and start grip training as tolerated.    01/27/22:  Due to fragile skin and hx of skin tears the orthotic process has been complicated. She now has several options including RMOs, protective gloves with immobilization orthotic as well. She states understanding NWB, HEP and POC and will f/u in 3 weeks to determine healing. She can come in sooner for any pain/new issues/adjustments.     PLAN: OT FREQUENCY: 1-2x/week   OT DURATION: 8 weeks (through 03/19/22)   PLANNED INTERVENTIONS: self care/ADL training, therapeutic exercise, therapeutic activity, neuromuscular re-education, manual therapy, scar mobilization, passive range of motion, splinting, ultrasound, fluidotherapy, compression bandaging, moist heat, cryotherapy, contrast bath, patient/family education, coping strategies training, and Re-evaluation   RECOMMENDED OTHER SERVICES: none now    CONSULTED AND AGREED WITH PLAN OF CARE: Patient   PLAN FOR NEXT SESSION:  Check motion, HEP, do fnl activities, etc.    Benito Mccreedy, OTR/L, CHT 02/17/2022, 10:19 AM

## 2022-02-18 ENCOUNTER — Ambulatory Visit (INDEPENDENT_AMBULATORY_CARE_PROVIDER_SITE_OTHER): Payer: Medicare Other | Admitting: Internal Medicine

## 2022-02-18 ENCOUNTER — Encounter: Payer: Self-pay | Admitting: Internal Medicine

## 2022-02-18 VITALS — BP 124/72 | HR 71 | Ht 61.5 in | Wt 151.4 lb

## 2022-02-18 DIAGNOSIS — Z794 Long term (current) use of insulin: Secondary | ICD-10-CM

## 2022-02-18 DIAGNOSIS — E1165 Type 2 diabetes mellitus with hyperglycemia: Secondary | ICD-10-CM

## 2022-02-18 LAB — POCT GLYCOSYLATED HEMOGLOBIN (HGB A1C): Hemoglobin A1C: 8 % — AB (ref 4.0–5.6)

## 2022-02-18 MED ORDER — REPAGLINIDE 0.5 MG PO TABS: 1.0000 mg | ORAL_TABLET | Freq: Every day | ORAL | 3 refills | Status: DC

## 2022-02-18 MED ORDER — INSULIN PEN NEEDLE 31G X 5 MM MISC: 1.0000 | Freq: Every day | 3 refills | Status: DC

## 2022-02-18 MED ORDER — LANTUS SOLOSTAR 100 UNIT/ML ~~LOC~~ SOPN: 40.0000 [IU] | PEN_INJECTOR | Freq: Every day | SUBCUTANEOUS | 3 refills | Status: DC

## 2022-02-18 NOTE — Progress Notes (Signed)
Name: Debra Leach  MRN/ DOB: [id number], Oct 06, 1950   Age/ Sex: 71 y.o., female    PCP: [name], MD   Reason for Endocrinology Evaluation: Type 2 Diabetes Mellitus     Date of Initial Endocrinology Visit: 07/10/2021    PATIENT IDENTIFIER: Debra Leach is a 71 y.o. female with a past medical history of DM, renal stones, Autoimmune pancreatitis . The patient presented for initial endocrinology clinic visit on 07/10/2021 for consultative assistance with her diabetes management.    HPI: Ms. Riding was    Diagnosed with DM 2012 Prior Medications tried/Intolerance: Metformin- GI side effects .  Currently checking blood sugars 2 x / day Hemoglobin A1c has ranged from 6.7% in 2019, peaking at 9.5% in 2022.    Has limited diet due to migraine headaches she has to eat every 3 hours otherwise her headaches will worsen  She is on long-term glucocorticoids for autoimmune pancreatitis for a long term  started  on 08/2017   On her initial visit to our clinic she had an A1c of 9.5%, she is intolerant to Jardiance due to recurrent UTIs.  We started her on repaglinide  She was also diagnosed with MNG a few years ago with last ultrasound 04/2020, thyroid nodules did not meet criteria for further follow-up.    SUBJECTIVE:   During the last visit (07/10/2021): A1c 9.5%, continued Lantus and started Jardiance  Today (02/18/22): [name] is here for follow-up on diabetes management.  She checks her blood sugars 1-3 times daily, preprandial . The patient has  had hypoglycemic episodes since the last clinic visit.   She continues to follow-up with GI for autoimmune pancreatitis Patient continues to undergo physical therapy She has chronic nausea Chronic migraine headaches improving the last 2 weeks   She has ruptured right 3rd middle finger tissue    HOME DIABETES REGIMEN: Lantus 40 units daily  Repaglinide 0.5 mg, 2 tabs before lunch     Statin:  INtolerant to crestor  ACE-I/ARB: yes Prior Diabetic Education: no    GLUCOSE LOG:   Fasting 84- 159 Lunch 70 -193 Supper  145- 250        DIABETIC COMPLICATIONS: Microvascular complications:  CKDIII Denies: Neuropathy, retinopathy Last eye exam: Completed   Macrovascular complications:   Denies: CAD, PVD, CVA   PAST HISTORY: Past Medical History:  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[place])    Clotting disorder (HCC)    Complication of anesthesia    difficult waking   Diabetes mellitus without complication (HCC)    Diet and exercise controlled   Diverticulosis    DVT (deep venous thrombosis) ([place]) 02/2019   3calf 2 right lung 1 left lung   DVT (deep venous thrombosis) ([place]) 2020   right leg   Dyspnea    GERD (gastroesophageal reflux disease)    Headache    Hepatic steatosis    Hiatal hernia    History of kidney stones    Hyperlipidemia    Hypertension    MVP (mitral valve prolapse)    history of   PONV (postoperative nausea and vomiting)    Pulmonary emboli ([place]) 03/01/2019   2 in the right; 1 in the left   Past Surgical History:  Past Surgical History:  Procedure Laterality Date   arm surgery     left forearm fracture w/ plates  pins screws   ARTERY BIOPSY Right 12/03/2021   Procedure: BIOPSY TEMPORAL ARTERY;  Surgeon: Algernon Huxley, MD;  Location: [facility] ORS;  Service: Vascular;  Laterality: Right;  Right temple   BREAST BIOPSY Left    BREAST SURGERY  2000   breast biopsy (benign)   LITHOTRIPSY  08/12/2008   stent placed bilaterally   PARTIAL HYSTERECTOMY     Both ovaries remain, vaginal, for [place]    Social History:  reports that she has quit smoking. Her smoking use included cigarettes. She has never used smokeless tobacco. She reports that she does not drink alcohol and does not use drugs. Family  History:  Family History  Problem Relation Age of Onset   Cancer Mother        bone   Hypertension Father    Mitral valve prolapse Father    Asthma Brother    Arthritis Brother    Nephrolithiasis Brother    Nephrolithiasis Brother    Asthma Brother    Arthritis Brother    Aortic aneurysm Brother        ascending aortic aneuysm   Breast cancer Maternal Aunt    Breast cancer Maternal Aunt    Colon cancer Neg Hx    Esophageal cancer Neg Hx    Inflammatory bowel disease Neg Hx    Liver disease Neg Hx    Pancreatic cancer Neg Hx    Rectal cancer Neg Hx    Stomach cancer Neg Hx      HOME MEDICATIONS: Allergies as of 02/18/2022       Reactions   Sudafed [pseudoephedrine Hcl] Hives, Itching, Anxiety   Atenolol    Codeine    REACTION: Migraine   Epinephrine Hives   Ibuprofen    Jardiance [empagliflozin]    Caused uti   Penicillins Itching   Has patient had a PCN reaction causing immediate rash, facial/tongue/throat swelling, SOB or lightheadedness with hypotension: No Has patient had a PCN reaction causing severe rash involving mucus membranes or skin necrosis: No Has patient had a PCN reaction that required hospitalization: No Has patient had a PCN reaction occurring within the last 10 years: No If all of the above answers are "NO", then may proceed with Cephalosporin use.   Pentazocine Lactate    REACTION: Swelling, itching, rash   Promethazine Other (See Comments)   migraine   Propranolol Other (See Comments)   Talwin [pentazocine] Other (See Comments)   Swelling and itching    Crestor [rosuvastatin] Other (See Comments)        Medication List        Accurate as of February 18, 2022  9:48 AM. If you have any questions, ask your nurse or doctor.          atorvastatin 80 MG tablet Commonly known as: LIPITOR Take 1 tablet (80 mg total) by mouth at bedtime.   budesonide 3 MG 24 hr capsule Commonly known as: ENTOCORT EC Take 2 capsules (6 mg total) by  mouth daily.   colesevelam 625 MG tablet Commonly known as: WELCHOL TAKE 3 TABLETS BY MOUTH 2 TIMES DAILY WITH A MEAL.   cyanocobalamin 1000 MCG tablet Commonly known as: VITAMIN B12 Take 1 tablet by mouth daily.   Eliquis 2.5 MG Tabs tablet Generic drug: apixaban TAKE 1 TABLET BY MOUTH 2 TIMES DAILY.   ferrous gluconate 324 MG tablet Commonly known as: FERGON Take 1 tablet (324 mg total)  by mouth daily with breakfast.   gabapentin 100 MG capsule Commonly known as: NEURONTIN TAKE 1 CAPSULE BY MOUTH UP TO 3 TIMES DAILY AS NEEDED.   gabapentin 300 MG capsule Commonly known as: NEURONTIN TAKE 2 CAPSULES BY MOUTH AT BEDTIME.   Insulin Pen Needle 31G X 5 MM Misc 1 Device by Does not apply route daily in the afternoon. What changed:  medication strength See the new instructions. Changed by: Dorita Sciara, MD   Lantus SoloStar 100 UNIT/ML Solostar Pen Generic drug: insulin glargine Inject 40 Units into the skin daily.   omeprazole 20 MG capsule Commonly known as: PRILOSEC Take 20 mg by mouth. before breakfast and bedtime   ondansetron 4 MG tablet Commonly known as: Zofran Take 1 tablet (4 mg total) by mouth every 8 (eight) hours as needed for nausea or vomiting.   OneTouch Delica Plus [id number] Misc USE TO CHECK BLOOD SUGAR TWO TIMES DAILY   OneTouch Verio Reflect w/Device Kit 2 (two) times daily. for testing   OneTouch Verio test strip Generic drug: glucose blood USE TO CHECK BLOOD SUGAR TWO TIMES A DAY   potassium chloride SA 20 MEQ tablet Commonly known as: KLOR-CON M TAKE 1 TABLET BY MOUTH DAILY.   repaglinide 0.5 MG tablet Commonly known as: PRANDIN Take 2 tablets (1 mg total) by mouth daily before lunch. before breakfast and lunch What changed: additional instructions   Ubrelvy 100 MG Tabs Generic drug: Ubrogepant TAKE 1 TABLET BY MOUTH AS NEEDED (MAY REPEAT DOSE AFTER 2 HOURS IF NEEDED. MAXIMUM 2 TABLETS IN 24 HOURS).   Vitamin D  (Ergocalciferol) 1.25 MG (50000 UNIT) Caps capsule Commonly known as: DRISDOL Take 50,000 Units by mouth once a week.   Vitamin D3 1.25 MG (50000 UT) Caps Take 1 capsule by mouth once a week.   Vyepti 100 MG/ML injection Generic drug: Eptinezumab-jjmr Inject 300 mg into the vein every 3 (three) months.         ALLERGIES: Allergies  Allergen Reactions   Sudafed [Pseudoephedrine Hcl] Hives, Itching and Anxiety   Atenolol    Codeine     REACTION: Migraine   Epinephrine Hives   Ibuprofen    Jardiance [Empagliflozin]     Caused uti   Penicillins Itching    Has patient had a PCN reaction causing immediate rash, facial/tongue/throat swelling, SOB or lightheadedness with hypotension: No Has patient had a PCN reaction causing severe rash involving mucus membranes or skin necrosis: No Has patient had a PCN reaction that required hospitalization: No Has patient had a PCN reaction occurring within the last 10 years: No If all of the above answers are "NO", then may proceed with Cephalosporin use.    Pentazocine Lactate     REACTION: Swelling, itching, rash   Promethazine Other (See Comments)    migraine   Propranolol Other (See Comments)   Talwin [Pentazocine] Other (See Comments)    Swelling and itching    Crestor [Rosuvastatin] Other (See Comments)     REVIEW OF SYSTEMS: A comprehensive ROS was conducted with the patient and is negative except as per HPI    OBJECTIVE:   VITAL SIGNS: BP 124/72 (BP Location: Left Arm, Patient Position: Sitting, Cuff Size: Small)   Pulse 71   Ht 5' 1.5" (1.562 m)   Wt 151 lb 6.4 oz (68.7 kg)   SpO2 97%   BMI 28.14 kg/m    PHYSICAL EXAM:  General: Pt appears well and is in NAD  Neck: General: Supple  without adenopathy or carotid bruits. Thyroid: Thyroid size normal.  No goiter or nodules appreciated.   Lungs: Clear with good BS bilat   Heart: RRR   Extremities:  Lower extremities - No pretibial edema.  Neuro: MS is good with  appropriate affect, pt is alert and Ox3    DM foot exam: 07/10/2021  The skin of the feet is intact without sores or ulcerations. The pedal pulses are 2+ on right and 2+ on left. The sensation is intact to a screening 5.07, 10 gram monofilament bilaterally   DATA REVIEWED:  Lab Results  Component Value Date   HGBA1C 8.0 (A) 02/18/2022   HGBA1C 8.8 (H) 08/17/2021   HGBA1C 9.5 (A) 07/13/2021   Lab Results  Component Value Date   MICROALBUR <0.7 07/10/2021   LDLCALC 139 (H) 08/17/2021   CREATININE 0.95 02/12/2022   Lab Results  Component Value Date   MICRALBCREAT 0.8 07/10/2021    Lab Results  Component Value Date   CHOL 229 (H) 08/17/2021   HDL 59.90 08/17/2021   LDLCALC 139 (H) 08/17/2021   LDLDIRECT 161.0 10/22/2020   TRIG 151.0 (H) 08/17/2021   CHOLHDL 4 08/17/2021        Latest Reference Range & Units 07/10/21 10:10  Sodium 135 - 145 mEq/L 141  Potassium 3.5 - 5.1 mEq/L 4.0  Chloride 96 - 112 mEq/L 101  CO2 19 - 32 mEq/L 32  Glucose 70 - 99 mg/dL 214 (H)  BUN 6 - 23 mg/dL 16  Creatinine 0.40 - 1.20 mg/dL 1.13  Calcium 8.4 - 10.5 mg/dL 9.6  GFR >60.00 mL/min 49.41 (L)    Latest Reference Range & Units 07/10/21 10:10  MICROALB/CREAT RATIO 0.0 - 30.0 mg/g 0.8    Latest Reference Range & Units 07/10/21 10:10  C-Peptide 0.80 - 3.85 ng/mL 2.50    Latest Reference Range & Units 07/10/21 10:10  Creatinine,U mg/dL 87.7  Microalb, Ur 0.0 - 1.9 mg/dL <0.7  MICROALB/CREAT RATIO 0.0 - 30.0 mg/g 0.8   Thyroid ULtrasound 04/2020   Estimated total number of nodules >/= 1 cm: 1   Number of spongiform nodules >/=  2 cm not described below (TR1): 0   Number of mixed cystic and solid nodules >/= 1.5 cm not described below ([place]): 0   _________________________________________________________   Nodule # 4: The previously identified TI-RADS category 4 nodule in the right inferior gland is somewhat elongated and dumbbell-shaped. On today's study, slightly higher  resolution demonstrates that this actually represents 2 smaller subcentimeter nodules which are adjacent and abutting. Measured independently, neither nodule measures over 1 cm and therefore does not meet criteria for further imaging surveillance. Of note, the nodular conglomerate demonstrates no interval change in size or appearance compared to the prior imaging from October 2020.   Incidental note again made of multiple cystic and minimally complex cystic/solid nodules scattered throughout the thyroid. None of these meet criteria for further evaluation.   IMPRESSION: The previously identified TI-RADS category 4 nodule in the right inferior gland is identified on the present examination as 2 smaller adjacent and abutting nodules. While the overall conglomerate demonstrates no change compared to the prior study, the fact that this is 2 separate smaller nodules results in a down grade of the TI-RADS classification system. The nodules do not meet size criteria to recommend further evaluation. No further follow-up required.    ASSESSMENT / PLAN / RECOMMENDATIONS:   1) Type 2 Diabetes Mellitus, Sub-optimally  controlled, With CKD III complications - Most  recent A1c of 8.0%. Goal A1c < 7.0 %.     - A1c down  8.8% to 8.0%.  I have praised the patient on improved glycemic control -Patient is not able to follow a low-carb diet due to autoimmune pancreatitis, chronic GI symptoms, and migraine headaches which limits her dietary options.  Our goal is to prevent hypoglycemia and severe hyperglycemia - Pt with hx of pancreatitis hence GLP-1 agonist and DPP 4 inhibitors as well as Tirzepatide are all contraindicated - Intolerant to Jardiance due to recurrent genital infections  - Dexcom was declined and cost prohibitive  -She understands that she may end up on prandial insulin in the future, but at this time no change  MEDICATIONS: Continue Lantus 40 units daily Continue Repaglinidie 0.5 mg, 2  tablets Before Lunch   EDUCATION / INSTRUCTIONS: BG monitoring instructions: Patient is instructed to check her blood sugars 1 times a day, fasting. Call [facility] if: BG persistently < 70  I reviewed the Rule of 15 for the treatment of hypoglycemia in detail with the patient. Literature supplied.   2) Diabetic complications:  Eye: Does not have known diabetic retinopathy.  Neuro/ Feet: Does not have known diabetic peripheral neuropathy. Renal: Patient does  have known baseline CKD. She is  on an ACEI/ARB at present.   3) multinodular goiter:  -Her last thyroid ultrasound was in 2021, and there was no recommendations for serial monitoring -No local neck symptoms -Patient understands that we will have low threshold for repeating ultrasound with any clinical concerns  Follow-up in 6 months      Signed electronically by: Mack Guise, MD  [facility]  [facility] [address] [place],  [address] Phone: [phone] FAX: [phone]   CC: [name], MD [place] [address] Phone: [phone]  Fax: [phone]    Return to Endocrinology clinic as below: Future Appointments  Date Time Provider [place]  02/19/2022  9:00 AM GI-BCG MM 2 GI-BCGMM GI-BREAST CE  02/19/2022  9:30 AM GI-[facility] DX DEXA 1 GI-BCGDG GI-BREAST CE  03/01/2022  9:30 AM Benito Mccreedy, OT OC-OPT None  03/16/2022  9:30 AM Benito Mccreedy, OT OC-OPT None  04/21/2022  9:50 AM Mansouraty, Telford Nab., MD [facility]  05/10/2022  9:20 AM Collier Salina, MD CR-GSO None  08/31/2022  9:30 AM Pieter Partridge, DO LBN-LBNG None  09/01/2022  9:30 AM Bristol Soy, Melanie Crazier, MD LBPC-LBENDO None

## 2022-02-18 NOTE — Patient Instructions (Signed)
-   Continue  Lantus 40 units daily  - Continue Repaglinide 0.5 mg , 2 tablets before  Lunch     HOW TO TREAT LOW BLOOD SUGARS (Blood sugar LESS THAN 70 MG/DL) Please follow the RULE OF 15 for the treatment of hypoglycemia treatment (when your (blood sugars are less than 70 mg/dL)   STEP 1: Take 15 grams of carbohydrates when your blood sugar is low, which includes:  3-4 GLUCOSE TABS  OR 3-4 OZ OF JUICE OR REGULAR SODA OR ONE TUBE OF GLUCOSE GEL    STEP 2: RECHECK blood sugar in 15 MINUTES STEP 3: If your blood sugar is still low at the 15 minute recheck --> then, go back to STEP 1 and treat AGAIN with another 15 grams of carbohydrates.

## 2022-02-19 ENCOUNTER — Ambulatory Visit
Admission: RE | Admit: 2022-02-19 | Discharge: 2022-02-19 | Disposition: A | Discharge status: Home / Self Care | Payer: Medicare Other | Source: Ambulatory Visit | Attending: Family Medicine | Admitting: Family Medicine

## 2022-02-19 DIAGNOSIS — Z1231 Encounter for screening mammogram for malignant neoplasm of breast: Secondary | ICD-10-CM

## 2022-02-19 DIAGNOSIS — M8580 Other specified disorders of bone density and structure, unspecified site: Secondary | ICD-10-CM

## 2022-02-19 LAB — THIOPURINE METHYLTRANSFERASE (TPMT), RBC: Thiopurine Methyltransferase, RBC: 18 nmol/hr/mL RBC

## 2022-02-24 ENCOUNTER — Encounter: Payer: Self-pay | Admitting: Gastroenterology

## 2022-02-24 NOTE — Telephone Encounter (Signed)
This is a new patient of mine who needs refills. Omeprazole 20 mg twice daily (60/12 refills) Budesonide 6 mg daily (3 mg capsules/60 capsules/12 refills) I will have my team work on getting these sent to the pharmacy. Thanks. GM

## 2022-02-25 ENCOUNTER — Other Ambulatory Visit: Payer: Self-pay

## 2022-02-25 MED ORDER — BUDESONIDE 3 MG PO CPEP: 6.0000 mg | ORAL_CAPSULE | Freq: Every day | ORAL | 12 refills | Status: DC

## 2022-02-25 MED ORDER — OMEPRAZOLE 20 MG PO CPDR: 20.0000 mg | DELAYED_RELEASE_CAPSULE | Freq: Two times a day (BID) | ORAL | 12 refills | Status: DC

## 2022-02-27 LAB — HELICOBACTER PYLORI  SPECIAL ANTIGEN
MICRO NUMBER:: 13905275
SPECIMEN QUALITY: ADEQUATE

## 2022-02-27 LAB — PANCREATIC ELASTASE, FECAL: Pancreatic Elastase-1, Stool: 433 mcg/g

## 2022-02-27 NOTE — Progress Notes (Unsigned)
Office Visit Note  Patient: Debra Leach             Date of Birth: September 24, 1950           MRN: [id number]             PCP: [name], MD Referring: Irving Copas.* Visit Date: 03/01/2022 Occupation: '@GUAROCC'$ @  Subjective:  No chief complaint on file.   History of Present Illness: Debra Leach is a 71 y.o. female here for IgG4-RD with autoimmune pancreatitis. She was diagnosed after nausea and vomiting symptoms led to biopsy of pancreatic mass identified in 2019 and with elevated serum IgG4 level of 1113. She started treatment with systemic steroids an on pretty much continuous steroid therapy. Repeat biopsy in 2021 demonstrated continued IgG4+ cell infiltrate in pancreatic mass. She switched from prednisone to budesonide for years and has tapered doses somewhat but on continuous therapy. She was never on other DMARD treatments throughout this time. Most recent abdominal imaging indicating severe pancreatic atrophy. She has experienced joint pain at multiple areas previously attributed to osteoarthritis. No clearly documented periorbital involvement or sialadenitis. No imaging findings or significant hydronephrosis suggestive for retroperitoneal fibrosis. She went for temporal artery biopsy earlier this year due to headache and visual changes in the setting of chronically elevated sedimentation rate. The sample was negative and subsequent neurology follow up with migraine features as the most likely cause. Recent labs with serum IgG4 of 234 which is much lower than her past elevation. ***    Activities of Daily Living:  Patient reports morning stiffness for *** {minute/hour:19697}.   Patient {ACTIONS;DENIES/REPORTS:21021675::"Denies"} nocturnal pain.  Difficulty dressing/grooming: {ACTIONS;DENIES/REPORTS:21021675::"Denies"} Difficulty climbing stairs: {ACTIONS;DENIES/REPORTS:21021675::"Denies"} Difficulty getting out of chair:  {ACTIONS;DENIES/REPORTS:21021675::"Denies"} Difficulty using hands for taps, buttons, cutlery, and/or writing: {ACTIONS;DENIES/REPORTS:21021675::"Denies"}  No Rheumatology ROS completed.   PMFS History:  Patient Active Problem List   Diagnosis Date Noted   IgG4 related disease ([place]) 02/14/2022   Colon cancer screening 02/14/2022   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[place]) 02/13/2022   Iron deficiency anemia 02/13/2022   Nausea without vomiting 02/13/2022   Pain in right hand 01/19/2022   Milium 01/15/2022   Abnormal CXR 12/11/2021   Acute right-sided thoracic back pain 12/11/2021   SK (seborrheic keratosis) 11/27/2021   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   Hypokalemia 11/03/2021   Diabetes mellitus with no complication ([place]) 65/68/1275   Vaginal candidiasis 08/06/2021   Hematuria, gross 08/06/2021   Type 2 diabetes mellitus with stage 3a chronic kidney disease, with long-term current use of insulin ([place]) 07/10/2021   Multinodular goiter 07/10/2021   Leg cramps 05/08/2021   Spondylolisthesis at L4-L5 level 04/24/2021   COVID-19 03/23/2021   Thyroid nodule 04/11/2020   AK (actinic keratosis) 09/26/2019   Dyspnea on minimal exertion 07/13/2019   Stuttering 07/13/2019   Hypertension associated with diabetes ([place]) 07/06/2019   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[place]) 07/06/2019   Multiple thyroid nodules 03/27/2019   DVT, lower extremity, proximal, acute, right ([place]) 03/13/2019   History of pulmonary embolism 03/01/2019   Leukocytosis 03/01/2019   Breast nodule 03/01/2019   Abnormal renal finding 06/29/2018   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[place]) 08/16/2017   Facial rash 03/01/2017   Eustachian tube disorder, bilateral 12/16/2016   Lightheadedness 12/16/2016   Esophageal dysphagia 11/22/2016   Chronic low back pain 10/07/2016   Vision changes 08/12/2016   Aortic atherosclerosis ([place]) 06/11/2016   Supraclavicular fossa fullness 06/08/2016   Family history of  aortic aneurysm 08/20/2014   Vitamin  D deficiency 08/20/2014   Osteopenia 11/06/2009   Hyperlipidemia associated with type 2 diabetes mellitus ([place]) 07/05/2008   INSOMNIA, CHRONIC 07/05/2008   Migraine with aura 07/05/2008   GERD 07/05/2008   OSTEOARTHRITIS 07/05/2008   MITRAL VALVE PROLAPSE, HX OF 07/05/2008   NEPHROLITHIASIS, HX OF 07/05/2008    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[place])    Clotting disorder (HCC)    Complication of anesthesia    difficult waking   Diabetes mellitus without complication (HCC)    Diet and exercise controlled   Diverticulosis    DVT (deep venous thrombosis) ([place]) 02/2019   3calf 2 right lung 1 left lung   DVT (deep venous thrombosis) ([place]) 2020   right leg   Dyspnea    GERD (gastroesophageal reflux disease)    Headache    Hepatic steatosis    Hiatal hernia    History of kidney stones    Hyperlipidemia    Hypertension    MVP (mitral valve prolapse)    history of   PONV (postoperative nausea and vomiting)    Pulmonary emboli ([place]) 03/01/2019   2 in the right; 1 in the left    Family History  Problem Relation Age of Onset   Cancer Mother        bone   Hypertension Father    Mitral valve prolapse Father    Asthma Brother    Arthritis Brother    Nephrolithiasis Brother    Nephrolithiasis Brother    Asthma Brother    Arthritis Brother    Aortic aneurysm Brother        ascending aortic aneuysm   Breast cancer Maternal Aunt    Breast cancer Maternal Aunt    Colon cancer Neg Hx    Esophageal cancer Neg Hx    Inflammatory bowel disease Neg Hx    Liver disease Neg Hx    Pancreatic cancer Neg Hx    Rectal cancer Neg Hx    Stomach cancer Neg Hx    Past Surgical History:  Procedure Laterality Date   arm surgery     left forearm fracture w/ plates pins screws   ARTERY BIOPSY Right 12/03/2021   Procedure: BIOPSY TEMPORAL ARTERY;   Surgeon: Algernon Huxley, MD;  Location: [facility] ORS;  Service: Vascular;  Laterality: Right;  Right temple   BREAST BIOPSY Left    BREAST SURGERY  2000   breast biopsy (benign)   LITHOTRIPSY  08/12/2008   stent placed bilaterally   PARTIAL HYSTERECTOMY     Both ovaries remain, vaginal, for [place]   Social History   Social History Narrative   Regular exercise--yes, recumbent bike 3-4 days a week      Diet: fruits and veggies, water, eats at home, drinks a lot of milk      Patient is right-handed. She drinks 1-2 cups of coffee a day.      One story home   Immunization History  Administered Date(s) Administered   Fluad Quad(high Dose 65+) 02/15/2019   Influenza Split 02/15/2012   Influenza Whole 04/24/2009, 02/26/2010   Influenza, High Dose Seasonal PF 02/28/2020, 04/03/2021   Influenza,inj,Quad PF,6+ Mos 02/15/2013, 02/19/2014, 02/21/2015, 02/24/2016, 03/22/2017, 02/07/2018   Moderna SARS-COV2 Booster Vaccination 07/08/2021   PFIZER(Purple Top)SARS-COV-2 Vaccination 06/29/2019, 07/20/2019, 03/07/2020, 10/09/2020   Pneumococcal  Conjugate-13 05/18/2016   Pneumococcal Polysaccharide-23 02/21/2015, 07/15/2020   Td 06/07/2006, 04/09/2021   Tdap 02/24/2016   Zoster Recombinat (Shingrix) 04/12/2019, 08/15/2019   Zoster, Live 05/27/2011     Objective: Vital Signs: There were no vitals taken for this visit.   Physical Exam   Musculoskeletal Exam: ***  CDAI Exam: CDAI Score: -- Patient Global: --; Provider Global: -- Swollen: --; Tender: -- Joint Exam 03/01/2022   No joint exam has been documented for this visit   There is currently no information documented on the homunculus. Go to the Rheumatology activity and complete the homunculus joint exam.  Investigation: No additional findings.  Imaging: MM 3D SCREEN BREAST BILATERAL  Result Date: 02/22/2022 CLINICAL DATA:  Screening. EXAM: DIGITAL SCREENING BILATERAL MAMMOGRAM WITH  TOMOSYNTHESIS AND CAD TECHNIQUE: Bilateral screening digital craniocaudal and mediolateral oblique mammograms were obtained. Bilateral screening digital breast tomosynthesis was performed. The images were evaluated with computer-aided detection. COMPARISON:  Previous exam(s). ACR Breast Density Category b: There are scattered areas of fibroglandular density. FINDINGS: There are no findings suspicious for malignancy. IMPRESSION: No mammographic evidence of malignancy. A result letter of this screening mammogram will be mailed directly to the patient. RECOMMENDATION: Screening mammogram in one year. (Code:SM-B-01Y) BI-RADS CATEGORY  1: Negative. Electronically Signed   By: Lajean Manes M.D.   On: 02/22/2022 09:49   DG Bone Density  Result Date: 02/19/2022 EXAM: DUAL X-RAY ABSORPTIOMETRY (DXA) FOR BONE MINERAL DENSITY IMPRESSION: Referring Physician:  AMY E BEDSOLE Your patient completed a bone mineral density test using GE Lunar iDXA system (analysis version: 16). Technologist: [employer] PATIENT: Name: Emile, Ringgenberg Patient ID: [id number] Birth Date: 1950/06/29 Height: 59.5 in. Sex: Female Measured: 02/19/2022 Weight: 150.0 lbs. Indications: Caucasian, Estrogen Deficient, Hysterectomy, Left Wrist Surgery, Postmenopausal Fractures: Left Wrist Treatments: Vitamin D (E933.5) ASSESSMENT: The BMD measured at Femur Neck Left is 0.761 g/cm2 with a T-score of -2.0. This patient is considered osteopenic/low bone mass according to [place] [facility]) criteria. The quality of the exam is good. L2, L3 was excluded due to degenerative changes.Left forearm excluded due to surgical hardware. Site Region Measured Date Measured Age YA BMD Significant CHANGE T-score AP Spine L1-L4 (L2,L3) 02/19/2022 70.7 -1.8 0.948 g/cm2 * AP Spine L1-L4 (L2,L3) 03/28/2018 66.8 -0.9 1.055 g/cm2 DualFemur Neck Left 02/19/2022 70.7 -2.0 0.761 g/cm2 DualFemur Neck Left 03/28/2018 66.8 -1.8 0.784 g/cm2 DualFemur Total Mean 02/19/2022 70.7  -0.8 0.908 g/cm2 DualFemur Total Mean 03/28/2018 66.8 -0.9 0.897 g/cm2 World Health Organization [facility]) criteria for post-menopausal, Caucasian Women: Normal       T-score at or above -1 SD Osteopenia   T-score between -1 and -2.5 SD Osteoporosis T-score at or below -2.5 SD RECOMMENDATION: 1. All patients should optimize calcium and vitamin D intake. 2. Consider FDA-approved medical therapies in postmenopausal women and men aged 70 years and older, based on the following: a. A hip or vertebral (clinical or morphometric) fracture. b. T-score = -2.5 at the femoral neck or spine after appropriate evaluation to exclude secondary causes. c. Low bone mass (T-score between -1.0 and -2.5 at the femoral neck or spine) and a 10-year probability of a hip fracture = 3% or a 10-year probability of a major osteoporosis-related fracture = 20% based on the [place]-adapted WHO algorithm. d. Clinician judgment and/or patient preferences may indicate treatment for people with 10-year fracture probabilities above or below these levels. FOLLOW-UP: Patients with diagnosis of osteoporosis or at high risk for fracture should have regular bone mineral density tests.? Patients  eligible for Medicare are allowed routine testing every 2 years.? The testing frequency can be increased to one year for patients who have rapidly progressing disease, are receiving or discontinuing medical therapy to restore bone mass, or have additional risk factors. I have reviewed this study and agree with the findings. [facility] FRAX* 10-year Probability of Fracture Based on femoral neck BMD: DualFemur (Left) Major Osteoporotic Fracture: 11.7% Hip Fracture:                2.3% Population:                  [place] (Caucasian) Risk Factors:                None *FRAX is a Materials engineer of the [employer] for Metabolic Bone Disease, a World Pharmacologist (WHO) [facility]. ASSESSMENT: The probability of a major  osteoporotic fracture is 11.7 % within the next ten years. The probability of a hip fracture is 2.3 % within the next ten years. Electronically Signed   By: Franki Cabot M.D.   On: 02/19/2022 09:47    Recent Labs: Lab Results  Component Value Date   WBC 12.4 (H) 02/12/2022   HGB 11.8 (L) 02/12/2022   PLT 317.0 02/12/2022   NA 142 02/12/2022   K 3.5 02/12/2022   CL 107 02/12/2022   CO2 26 02/12/2022   GLUCOSE 106 (H) 02/12/2022   BUN 12 02/12/2022   CREATININE 0.95 02/12/2022   BILITOT 0.4 02/12/2022   ALKPHOS 71 02/12/2022   AST 15 02/12/2022   ALT 17 02/12/2022   PROT 7.0 02/12/2022   ALBUMIN 3.6 02/12/2022   CALCIUM 9.1 02/12/2022   GFRAA >60 11/21/2019    Speciality Comments: No specialty comments available.  Procedures:  No procedures performed Allergies: Sudafed [pseudoephedrine hcl], Atenolol, Codeine, Epinephrine, Ibuprofen, Jardiance [empagliflozin], Penicillins, Pentazocine lactate, Promethazine, Propranolol, Talwin [pentazocine], and Crestor [rosuvastatin]   Assessment / Plan:     Visit Diagnoses: No diagnosis found.  Orders: No orders of the defined types were placed in this encounter.  No orders of the defined types were placed in this encounter.   Face-to-face time spent with patient was *** minutes. Greater than 50% of time was spent in counseling and coordination of care.  Follow-Up Instructions: No follow-ups on file.   Collier Salina, MD  Note - This record has been created using [employer].  Chart creation errors have been sought, but may not always  have been located. Such creation errors do not reflect on  the standard of medical care.

## 2022-03-01 ENCOUNTER — Encounter: Payer: Medicare Other | Admitting: Rehabilitative and Restorative Service Providers"

## 2022-03-01 ENCOUNTER — Encounter: Payer: Self-pay | Admitting: Internal Medicine

## 2022-03-01 ENCOUNTER — Ambulatory Visit: Payer: Medicare Other | Attending: Internal Medicine | Admitting: Internal Medicine

## 2022-03-01 VITALS — BP 166/87 | HR 81 | Resp 14 | Ht 60.0 in | Wt 151.2 lb

## 2022-03-01 DIAGNOSIS — K861 Other chronic pancreatitis: Secondary | ICD-10-CM | POA: Diagnosis not present

## 2022-03-01 DIAGNOSIS — M159 Polyosteoarthritis, unspecified: Secondary | ICD-10-CM

## 2022-03-01 DIAGNOSIS — M8580 Other specified disorders of bone density and structure, unspecified site: Secondary | ICD-10-CM | POA: Diagnosis not present

## 2022-03-01 DIAGNOSIS — H04123 Dry eye syndrome of bilateral lacrimal glands: Secondary | ICD-10-CM | POA: Insufficient documentation

## 2022-03-01 DIAGNOSIS — R682 Dry mouth, unspecified: Secondary | ICD-10-CM

## 2022-03-01 DIAGNOSIS — D8989 Other specified disorders involving the immune mechanism, not elsewhere classified: Secondary | ICD-10-CM | POA: Diagnosis not present

## 2022-03-01 NOTE — Patient Instructions (Signed)
For dry mouth several treatments may be beneficial. Staying well hydrated is the first and very important.  Sugar free gum or lozenges can help stimulate saliva production. Sugar containing ones can help but constant use may affect blood sugar and risk to teeth over time.  Biotene mouth wash, spray, or lozenges can also be beneficial.

## 2022-03-02 ENCOUNTER — Encounter: Payer: Self-pay | Admitting: Rehabilitative and Restorative Service Providers"

## 2022-03-02 ENCOUNTER — Ambulatory Visit (INDEPENDENT_AMBULATORY_CARE_PROVIDER_SITE_OTHER): Payer: Medicare Other | Admitting: Rehabilitative and Restorative Service Providers"

## 2022-03-02 DIAGNOSIS — R600 Localized edema: Secondary | ICD-10-CM | POA: Diagnosis not present

## 2022-03-02 DIAGNOSIS — M25641 Stiffness of right hand, not elsewhere classified: Secondary | ICD-10-CM

## 2022-03-02 DIAGNOSIS — M79641 Pain in right hand: Secondary | ICD-10-CM

## 2022-03-02 DIAGNOSIS — M6281 Muscle weakness (generalized): Secondary | ICD-10-CM | POA: Diagnosis not present

## 2022-03-02 DIAGNOSIS — R278 Other lack of coordination: Secondary | ICD-10-CM

## 2022-03-02 NOTE — Therapy (Signed)
OUTPATIENT OCCUPATIONAL THERAPY TREATMENT NOTE   Patient Name: Debra Leach MRN: [id number] DOB:02/15/1951, 71 y.o., female Today's Date: 03/02/2022  PCP: Dr. Eliezer Lofts, MD REFERRING PROVIDER: Dr. Sherilyn Cooter, MD   END OF SESSION:   OT End of Session - 03/02/22 1018     Visit Number 5    Number of Visits 14    Date for OT Re-Evaluation 03/19/22    Authorization Type U[employer] Medicare    Progress Note Due on Visit 10    OT Start Time 1018    OT Stop Time 1132    OT Time Calculation (min) 74 min    Equipment Utilized During Treatment --    Activity Tolerance Patient tolerated treatment well;No increased pain;Patient limited by fatigue    Behavior During Therapy [facility] for tasks assessed/performed               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[place])    Clotting disorder (HCC)    Complication of anesthesia    difficult waking   Diabetes mellitus without complication (HCC)    Diet and exercise controlled   Diverticulosis    DVT (deep venous thrombosis) ([place]) 02/2019   3calf 2 right lung 1 left lung   DVT (deep venous thrombosis) ([place]) 2020   right leg   Dyspnea    GERD (gastroesophageal reflux disease)    Headache    Hepatic steatosis    Hiatal hernia    History of kidney stones    Hyperlipidemia    Hypertension    MVP (mitral valve prolapse)    history of   PONV (postoperative nausea and vomiting)    Pulmonary emboli ([place]) 03/01/2019   2 in the right; 1 in the left   Past Surgical History:  Procedure Laterality Date   arm surgery  2012   left forearm fracture w/ plates pins screws   ARTERY BIOPSY Right 12/03/2021   Procedure: BIOPSY TEMPORAL ARTERY;  Surgeon: Algernon Huxley, MD;  Location: [facility] ORS;  Service: Vascular;  Laterality: Right;  Right temple   BREAST BIOPSY Left    BREAST SURGERY  2000   breast biopsy (benign)   CATARACT  EXTRACTION Right 2005   CATARACT EXTRACTION Left 2010   LITHOTRIPSY  08/12/2008   stent placed bilaterally   PANCREAS BIOPSY  2021   PANCREAS BIOPSY  2019   PARTIAL HYSTERECTOMY     Both ovaries remain, vaginal, for [place]   Patient Active Problem List   Diagnosis Date Noted   Dry mouth and eyes 03/01/2022   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   Colon cancer screening 02/14/2022   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[place]) 02/13/2022   Iron deficiency anemia 02/13/2022   Nausea without vomiting 02/13/2022   Pain in right hand 01/19/2022   Milium 01/15/2022   Abnormal CXR 12/11/2021   Acute right-sided thoracic back pain 12/11/2021   SK (seborrheic keratosis) 11/27/2021   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   Hypokalemia 11/03/2021   Diabetes mellitus with no complication ([place]) 62/37/6283   Vaginal candidiasis 08/06/2021   Hematuria, gross 08/06/2021   Type 2 diabetes mellitus with stage 3a chronic kidney disease, with long-term current use of insulin ([place]) 07/10/2021   Multinodular goiter 07/10/2021   Leg cramps 05/08/2021   Spondylolisthesis at L4-L5  level 04/24/2021   COVID-19 03/23/2021   Thyroid nodule 04/11/2020   AK (actinic keratosis) 09/26/2019   Dyspnea on minimal exertion 07/13/2019   Stuttering 07/13/2019   Hypertension associated with diabetes ([place]) 07/06/2019   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[place]) 07/06/2019   Multiple thyroid nodules 03/27/2019   DVT, lower extremity, proximal, acute, right ([place]) 03/13/2019   History of pulmonary embolism 03/01/2019   Leukocytosis 03/01/2019   Breast nodule 03/01/2019   Abnormal renal finding 06/29/2018   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[place]) 08/16/2017   Facial rash 03/01/2017   Eustachian tube disorder, bilateral 12/16/2016   Lightheadedness 12/16/2016   Esophageal dysphagia 11/22/2016   Chronic low back pain 10/07/2016   Vision changes 08/12/2016    Aortic atherosclerosis ([place]) 06/11/2016   Supraclavicular fossa fullness 06/08/2016   Family history of aortic aneurysm 08/20/2014   Vitamin D deficiency 08/20/2014   Osteopenia 11/06/2009   Hyperlipidemia associated with type 2 diabetes mellitus ([place]) 07/05/2008   INSOMNIA, CHRONIC 07/05/2008   Migraine with aura 07/05/2008   GERD 07/05/2008   Osteoarthritis 07/05/2008   MITRAL VALVE PROLAPSE, HX OF 07/05/2008   NEPHROLITHIASIS, HX OF 07/05/2008    ONSET DATE: "about a month" (12/18/21 aprox DOI)    REFERRING DIAG: R74.081 (ICD-10-CM) - Pain in right finger(s)  THERAPY DIAG:  Localized edema  Muscle weakness (generalized)  Stiffness of right hand, not elsewhere classified  Pain in right hand  Other lack of coordination  Rationale for Evaluation and Treatment Rehabilitation  PERTINENT HISTORY: Per MD: "Patient will need to be evaluated by OT for relative motion, sagittal band rupture for right middle finger"    PRECAUTIONS: 02/17/22: now ~11 weeks since injury with ~4 weeks of immobilization; composite AROM, AAROM and light fnl act as tolerated now. PROM and very light hand strength next week; D/C orthotic in 2 weeks as tolerated; Also PmHx of autoimmune disease and has past multiple DVTs and PE (increased risk)    WEIGHT BEARING RESTRICTIONS Yes <5# recommended for 4-6 weeks at least     SUBJECTIVE:  She states not having significant pain in past 2 weeks, progressing from AROM to composite AROM and bracing between as asked. No pain, not MF MCP J subluxation.    PAIN:  Are you having pain? No Rating: 0/10 at rest now in middle finger, up to 1-2/10 at worst in past week   OBJECTIVE: (All objective assessments below are from initial evaluation on: 01/21/22 unless otherwise specified.)   HAND DOMINANCE: Right    ADLs: Overall ADLs: States decreased ability to grab, hold household objects, pain and inability to open containers, perform all FMS tasks, etc.       FUNCTIONAL OUTCOME MEASURES: Eval: Patient Specific Functional Scale: 4.6 (holding dishes, brushing teeth, open jars)   UPPER EXTREMITY ROM     03/02/22:  80* Rt wrist flexion and 78* wrist ext  Active ROM Right 03/02/22  Thumb MCP (0-60) 67   Thumb IP (0-80) 65  Thumb Opposition to Small Finger full  Long MCP (0-90) 95   Long PIP (0-100) 104   Long DIP (0-70) 94   (Blank rows = not tested)     UPPER EXTREMITY MMT:    Eval: not appropriate in right hand now (pain, weakness, healing ligaments, etc.), but for the most part, b/l proximal UE's seem equal strength b/l, no complaints.    MMT Right TBD PRN  Elbow flexion    Elbow extension    Wrist flexion    Wrist extension  Wrist ulnar deviation    Wrist radial deviation    Wrist pronation    Wrist supination    (Blank rows = not tested)   HAND FUNCTION: Eval: Grip strength Right: TBD when safe    COORDINATION: Eval: TBD but observably impaired in right hand due to inability to open hand after closing it.    SENSATION: Eval:  Light touch intact today   EDEMA:             Eval:  Moderately swollen in right hand at 3rd MCP J    COGNITION: Overall cognitive status: WFL for evaluation today, memory, recall, awareness, etc. all WNL in casual conversation   OBSERVATIONS:            03/02/22: She has no subluxation with motion, only has some stiff feeling in dorsum of palm, which is typical after immobilization. Tolerates AA/ROM well.     TODAY'S TREATMENT:  03/02/22: She starts with AROM for measures and review of HEP and performance while she's been self-managing. OT then edu on new, comprehensive upgrades to HEP with AA/ROM finger flexion and ext with blocking AROM at MCPJ, PIP J, DIP J as needed, new functional/coordination activities for in-hand manipulation (shift, rotate, translate), and weaning from orthotics carefully to start light fnl activities progressively-  all to tolerance.  She was also edu for next week's  upgraded HEP, that she can begin light stretches  as tolerated (work non-painfully, progressively) and light gripping, pinching and finger ext with yellow therapy putty which was given out and demo'd today. See below for full list of HEP.  Extra time was taken to ensure she knew and was comfortable with all current and future upgrades. She states understanding and is very pleased with progress so far, feels comfortable with self-management for 2 weeks, while she is out of town for weddings.   Exercises & Activities - Bend and Pull Back Wrist SLOWLY  - 4-6 x daily - 1-2 sets - 10-15 reps - Tendon Glides  - 4-6 x daily - 3-5 reps - 2-3 seconds hold - Seated Single Finger Extension  - 4-6 x daily - 1 sets - 5 reps - Hand AROM PIP Blocking  - 4-6 x daily - 1 sets - 10-15 reps - Seated Finger DIP Flexion AROM with Blocking  - 4-6 x daily - 1 sets - 10-15 reps -In-hand manipulations (3 ways, x10 mins x 2-3 xday)  Starting next week:  - Seated Wrist Flexion with Overpressure  - 3-4 x daily - 3-5 reps - 15 sec hold - Wrist Extension Stretch Pronated  - 3-4 x daily - 3-5 reps - 15 hold - HOOK Stretch  - 3-4 x daily - 2-3 reps - 15-20 sec hold - Seated Finger Composite Flexion Stretch  - 3-4 x daily - 3-5 reps - 15 hold - Full Fist  - 2-3 x daily - 5 reps - "Duck Mouth" Strength  - 2-3 x daily - 5 reps - Finger Extension "Pizza!"   - 2-3 x daily - 5 reps - Thumb Opposition with Putty  - 2-3 x daily - 5 reps   PATIENT EDUCATION: Education details: See tx section above for details  Person educated: Patient Education method: Verbal Instruction, Teach back, Handouts  Education comprehension: States and demonstrates understanding, Additional Education required      HOME EXERCISE PROGRAM: Access Code: [email or web address] URL: [email or web address] Date: 03/02/2022 Prepared by: Benito Mccreedy GOALS: Goals reviewed with patient? Yes     SHORT  TERM GOALS: (STG required if POC>30 days)   Pt  will obtain protective, custom orthotic. Target date: 01/21/22 Goal status: MET   2.  Pt will demo/state understanding of initial HEP to improve pain levels and prerequisite motion. Target date: 01/29/22 Goal status: 02/17/22 MET      LONG TERM GOALS:   Pt will improve functional ability by decreased impairment per PSFS assessment from 4.6 to 7 or better, for better quality of life. Target date: 03/19/22 Goal status: INITIAL   2.  Pt will improve grip strength in right hand to at least 40lbs for functional use at home and in IADLs. Target date: 03/19/22 Goal status: INITIAL   3.  Pt will improve A/ROM in right MF AROM flexion/extension from unable to actively extend after flexion to full functional flexion (at least 220*) and extension (0* at all joints), to have functional motion for tasks like reach and grasp.  Target date: 03/19/22 Goal status: INITIAL   4.  Pt will decrease pain at worst from 4/10 to 2/10 or better to have better sleep and occupational participation in daily roles. Target date: 03/19/22 Goal status: INITIAL     ASSESSMENT:   CLINICAL IMPRESSION: 03/02/22: She is doing very well, seemingly no significant pain or instability now, but will need to work out some stiffness and then also strengthen hand. She will likely be fit for D/C next session, if she was able to do all these things with no problems.   02/17/22: We will continue to progress as tolerated through typical recovery periods, including: next week (around 02/24/22) start composite A/ROM; Around 03/02/22 start AA/ROM and P/ROM the following week; D/C orthotic by 03/16/22 and start grip training as tolerated.    PLAN: OT FREQUENCY: 1-2x/week   OT DURATION: 8 weeks (through 03/19/22)   PLANNED INTERVENTIONS: self care/ADL training, therapeutic exercise, therapeutic activity, neuromuscular re-education, manual therapy, scar mobilization, passive range of motion, splinting, ultrasound, fluidotherapy,  compression bandaging, moist heat, cryotherapy, contrast bath, patient/family education, coping strategies training, and Re-evaluation   RECOMMENDED OTHER SERVICES: none now    CONSULTED AND AGREED WITH PLAN OF CARE: Patient   PLAN FOR NEXT SESSION:  Reassess, check new strength, stretches, coordination. Check goals and D/C if all met    Benito Mccreedy, OTR/L, CHT 03/02/2022, 12:29 PM

## 2022-03-15 ENCOUNTER — Other Ambulatory Visit: Payer: Self-pay | Admitting: Family Medicine

## 2022-03-15 ENCOUNTER — Other Ambulatory Visit: Payer: Self-pay | Admitting: Neurology

## 2022-03-15 DIAGNOSIS — G8929 Other chronic pain: Secondary | ICD-10-CM

## 2022-03-16 ENCOUNTER — Encounter: Payer: Self-pay | Admitting: Rehabilitative and Restorative Service Providers"

## 2022-03-16 ENCOUNTER — Ambulatory Visit (INDEPENDENT_AMBULATORY_CARE_PROVIDER_SITE_OTHER): Payer: Medicare Other | Admitting: Rehabilitative and Restorative Service Providers"

## 2022-03-16 DIAGNOSIS — M79641 Pain in right hand: Secondary | ICD-10-CM | POA: Diagnosis not present

## 2022-03-16 DIAGNOSIS — M6281 Muscle weakness (generalized): Secondary | ICD-10-CM | POA: Diagnosis not present

## 2022-03-16 DIAGNOSIS — R600 Localized edema: Secondary | ICD-10-CM | POA: Diagnosis not present

## 2022-03-16 DIAGNOSIS — M25641 Stiffness of right hand, not elsewhere classified: Secondary | ICD-10-CM | POA: Diagnosis not present

## 2022-03-16 DIAGNOSIS — R278 Other lack of coordination: Secondary | ICD-10-CM

## 2022-03-16 NOTE — Therapy (Signed)
OUTPATIENT OCCUPATIONAL THERAPY TREATMENT & DISCHARGE NOTE   Patient Name: Debra Leach MRN: [id number] DOB:27-Jan-1951, 71 y.o., female Today's Date: 03/16/2022  PCP: Dr. Eliezer Lofts, MD REFERRING PROVIDER: Dr. 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[place])    Clotting disorder (HCC)    Complication of anesthesia    difficult waking   Diabetes mellitus without complication (HCC)    Diet and exercise controlled   Diverticulosis    DVT (deep venous thrombosis) ([place]) 02/2019   3calf 2 right lung 1 left lung   DVT (deep venous thrombosis) ([place]) 2020   right leg   Dyspnea    GERD (gastroesophageal reflux disease)    Headache    Hepatic steatosis    Hiatal hernia    History of kidney stones    Hyperlipidemia    Hypertension    MVP (mitral valve prolapse)    history of   PONV (postoperative nausea and vomiting)    Pulmonary emboli ([place]) 03/01/2019   2 in the right; 1 in the left   Past Surgical History:  Procedure Laterality Date   arm surgery  2012   left forearm fracture w/ plates pins screws   ARTERY BIOPSY Right 12/03/2021   Procedure: BIOPSY TEMPORAL ARTERY;  Surgeon: Algernon Huxley, MD;  Location: [facility] ORS;  Service: Vascular;  Laterality: Right;  Right temple   BREAST BIOPSY Left    BREAST SURGERY  2000   breast biopsy (benign)   CATARACT EXTRACTION Right 2005   CATARACT EXTRACTION Left 2010   LITHOTRIPSY   08/12/2008   stent placed bilaterally   PANCREAS BIOPSY  2021   PANCREAS BIOPSY  2019   PARTIAL HYSTERECTOMY     Both ovaries remain, vaginal, for [place]   Patient Active Problem List   Diagnosis Date Noted   Dry mouth and eyes 03/01/2022   IgG4 related disease 02/14/2022   Colon cancer screening 02/14/2022   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[place]) 02/13/2022   Iron deficiency anemia 02/13/2022   Nausea without vomiting 02/13/2022   Pain in right hand 01/19/2022   Milium 01/15/2022   Abnormal CXR 12/11/2021   Acute right-sided thoracic back pain 12/11/2021   SK (seborrheic keratosis) 11/27/2021   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   Hypokalemia 11/03/2021   Diabetes mellitus with no complication ([place]) 12/75/1700   Vaginal candidiasis 08/06/2021   Hematuria, gross 08/06/2021   Type 2 diabetes mellitus with stage 3a chronic kidney disease, with long-term current use of insulin ([place]) 07/10/2021   Multinodular goiter 07/10/2021   Leg cramps 05/08/2021   Spondylolisthesis at L4-L5 level 04/24/2021   COVID-19 03/23/2021   Thyroid nodule 04/11/2020  AK (actinic keratosis) 09/26/2019   Dyspnea on minimal exertion 07/13/2019   Stuttering 07/13/2019   Hypertension associated with diabetes ([place]) 07/06/2019   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[place]) 07/06/2019   Multiple thyroid nodules 03/27/2019   DVT, lower extremity, proximal, acute, right ([place]) 03/13/2019   History of pulmonary embolism 03/01/2019   Leukocytosis 03/01/2019   Breast nodule 03/01/2019   Abnormal renal finding 06/29/2018   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[place]) 08/16/2017   Facial rash 03/01/2017   Eustachian tube disorder, bilateral 12/16/2016   Lightheadedness 12/16/2016   Esophageal dysphagia 11/22/2016   Chronic low back pain 10/07/2016   Vision changes 08/12/2016   Aortic atherosclerosis ([place]) 06/11/2016   Supraclavicular fossa fullness  06/08/2016   Family history of aortic aneurysm 08/20/2014   Vitamin D deficiency 08/20/2014   Osteopenia 11/06/2009   Hyperlipidemia associated with type 2 diabetes mellitus ([place]) 07/05/2008   INSOMNIA, CHRONIC 07/05/2008   Migraine with aura 07/05/2008   GERD 07/05/2008   Osteoarthritis 07/05/2008   MITRAL VALVE PROLAPSE, HX OF 07/05/2008   NEPHROLITHIASIS, HX OF 07/05/2008    ONSET DATE: "about a month" (12/18/21 aprox DOI)    REFERRING DIAG: D55.208 (ICD-10-CM) - Pain in right finger(s)  THERAPY DIAG:  Localized edema  Muscle weakness (generalized)  Stiffness of right hand, not elsewhere classified  Pain in right hand  Other lack of coordination  Rationale for Evaluation and Treatment Rehabilitation  PERTINENT HISTORY: Per MD: "Patient will need to be evaluated by OT for relative motion, sagittal band rupture for right middle finger"    PRECAUTIONS: Now 15+  weeks since injury with initial ~4 weeks of immobilization; composite A/A/PROM, fnl act as tolerated now. Also PmHx of autoimmune disease and has past multiple DVTs and PE (increased risk)    WEIGHT BEARING RESTRICTIONS WBAT now     SUBJECTIVE:  She states still feeling some shifting in Rt MF ext tendon and some avoidance of Rt hand still, but otherwise no pain now and has been weaning from braces slowly, cautiously.    PAIN:  Are you having pain? No  Rating: 0/10 at rest now in middle finger, up to 3/10 at worst in past week   OBJECTIVE: (All objective assessments below are from initial evaluation on: 01/21/22 unless otherwise specified.)   HAND DOMINANCE: Right    ADLs: Overall ADLs: 03/16/22: States no significant problems now     FUNCTIONAL OUTCOME MEASURES: 03/16/22: PSFS 7.3 today   Eval: Patient Specific Functional Scale: 4.6 (holding dishes, brushing teeth, open jars)   UPPER EXTREMITY ROM     03/16/22: full fist and opposition, though MF knuckle stands out a bit at MCP J.    Active ROM  Right 03/02/22 Rt 03/16/22  Thumb MCP (0-60) 67    Thumb IP (0-80) 65   Thumb Opposition to Small Finger full   Long MCP (0-90) 95  87  Long PIP (0-100) 104  99  Long DIP (0-70) 94  78  (Blank rows = not tested)     UPPER EXTREMITY MMT:     03/16/22: full strength (equal) in b/l finger flex and ext)   MMT Right 03/16/22  Wrist flexion 5/5   Wrist extension  5/5  (Blank rows = not tested)   HAND FUNCTION: 03/16/22: Rt hand grip: 39.lbs; Lt hand grip 40lbs    COORDINATION: 03/16/22: full FMS displayed today.      EDEMA:            03/16/22: very mild looking swelling  now in rt MD MCP J compared to Lt. better   Eval:  Moderately swollen in right hand at 3rd MCP J     OBSERVATIONS:            03/16/22: some ulnar shift seen in all finger MCP Js with only slightly more in RT MF MCP J now, but no locking or instability.     TODAY'S TREATMENT:  03/16/22: Pt performs AROM, gripping, and strength with right hand against resistance for exercise/activities as well as new measures today. Using that data, OT also reviews home exercises and activities with her, and provides finalized HEP recommendations (as below) and also safety recommendations for weaning form orthotics and progressively doing more resistive activities as tolerated. OT reviews goals with her and she meets all within satisfaction (pain at worst is 3/10 vs 2/10 at rest is only goal not met). Pt states understanding all recommendations and how to move forward, is in agreement wit hd/c therapy today.   Exercises & Activities - HOOK Stretch  - 3-4 x daily - 2-3 reps - 15-20 sec hold - Seated Finger Composite Flexion Stretch  - 3-4 x daily - 3-5 reps - 15 hold - Full Fist  - 2-3 x daily - 5 reps - "Duck Mouth" Strength  - 2-3 x daily - 5 reps - Finger Extension "Pizza!"   - 2-3 x daily - 5 reps - Thumb Opposition with Putty  - 2-3 x daily - 5 reps    PATIENT EDUCATION: Education details: See tx section above for  details  Person educated: Patient Education method: Verbal Instruction, Teach back, Handouts  Education comprehension: States and demonstrates understanding   HOME EXERCISE PROGRAM: Access Code: [email or web address] URL: [email or web address]  GOALS: Goals reviewed with patient? Yes     SHORT TERM GOALS: (STG required if POC>30 days)   Pt will obtain protective, custom orthotic. Target date: 01/21/22 Goal status: MET   2.  Pt will demo/state understanding of initial HEP to improve pain levels and prerequisite motion. Target date: 01/29/22 Goal status: 02/17/22 MET      LONG TERM GOALS:   Pt will improve functional ability by decreased impairment per PSFS assessment from 4.6 to 7 or better, for better quality of life. Target date: 03/19/22 Goal status: 03/16/22: MET   2.  Pt will improve grip strength in right hand to at least 40lbs for functional use at home and in IADLs. Target date: 03/19/22 Goal status: 03/16/22: MET   3.  Pt will improve A/ROM in right MF AROM flexion/extension from unable to actively extend after flexion to full functional flexion (at least 220*) and extension (0* at all joints), to have functional motion for tasks like reach and grasp.  Target date: 03/19/22 Goal status: 03/16/22: MET   4.  Pt will decrease pain at worst from 4/10 to 2/10 or better to have better sleep and occupational participation in daily roles. Target date: 03/19/22 Goal status: 03/16/22: Partially MET 3/10 in past in week- considered met to satisfaction.      ASSESSMENT:   CLINICAL IMPRESSION: 03/16/22: Meets D/C criteria    PLAN: OT FREQUENCY:  D/C    OT DURATION: D/C    PLANNED INTERVENTIONS: self care/ADL training, therapeutic exercise, therapeutic activity, neuromuscular re-education, manual therapy, scar mobilization, passive range of motion, splinting, ultrasound, fluidotherapy, compression bandaging, moist heat, cryotherapy, contrast bath, patient/family  education, coping strategies training, and Re-evaluation   RECOMMENDED OTHER SERVICES: none now    CONSULTED AND AGREED WITH  PLAN OF CARE: Patient   PLAN FOR NEXT SESSION:  D/C Today successfully   Benito Mccreedy, OTR/L, CHT 03/16/2022, 5:22 PM     OCCUPATIONAL THERAPY DISCHARGE SUMMARY  Visits from Start of Care: 6  Current functional level related to goals / functional outcomes: Pt has met all goals to satisfactory levels and is pleased with outcomes.   Remaining deficits: Pt has no more significant functional deficits or pain.   Education / Equipment: Pt has all needed materials and education. Pt understands how to continue on with self-management. See tx notes for more details.   Patient agrees to discharge due to max benefits received from outpatient occupational therapy / hand therapy at this time.   Benito Mccreedy, OTR/L, CHT 03/16/22

## 2022-03-25 ENCOUNTER — Encounter: Payer: Self-pay | Admitting: Neurology

## 2022-03-26 ENCOUNTER — Encounter: Payer: Self-pay | Admitting: Gastroenterology

## 2022-03-27 ENCOUNTER — Encounter: Payer: Self-pay | Admitting: Family Medicine

## 2022-04-05 ENCOUNTER — Encounter (INDEPENDENT_AMBULATORY_CARE_PROVIDER_SITE_OTHER): Payer: Self-pay

## 2022-04-09 ENCOUNTER — Ambulatory Visit (INDEPENDENT_AMBULATORY_CARE_PROVIDER_SITE_OTHER): Payer: Medicare Other | Admitting: Physician Assistant

## 2022-04-09 ENCOUNTER — Encounter: Payer: Self-pay | Admitting: Physician Assistant

## 2022-04-09 DIAGNOSIS — M79641 Pain in right hand: Secondary | ICD-10-CM

## 2022-04-09 NOTE — Progress Notes (Signed)
Office Visit Note   Patient: Debra Leach           Date of Birth: 1951/01/30           MRN: [id number] Visit Date: 04/09/2022              Requested by: [name], MD [address] [place],  [place] [address] PCP: [name], MD  Chief Complaint  Patient presents with   Right Hand - Pain      HPI: Debra Leach is a pleasant 71 year old woman who has seen Dr. Tempie Donning as well as myself in the past.  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  She also has started to have similar symptoms in her left middle finger.  She has worked with physical therapy on a brace.  Unfortunately she still has continued symptoms.  Of note her hemoglobin A1c is 8.0  Assessment & Plan: Visit Diagnoses:  1. Pain in right hand     Plan: She has seen Dr. Tempie Donning in the past.  I will put in a referral for him to evaluate both fingers to see if there is anything surgically or any other options.  May follow-up with me as needed  Follow-Up Instructions: Return With Dr. Tempie Donning.   Ortho Exam  Patient is alert, oriented, no adenopathy, well-dressed, normal affect, normal respiratory effort. Examination of her left hand she has lack of active extension of her right middle finger and her left middle finger.  No redness no erythema no signs of infection.  She has a strong radial pulse brisk capillary refill  Imaging: No results found. No images are attached to the encounter.  Labs: Lab Results  Component Value Date   HGBA1C 8.0 (A) 02/18/2022   HGBA1C 8.8 (H) 08/17/2021   HGBA1C 9.5 (A) 07/13/2021   ESRSEDRATE 84 (H) 02/12/2022   ESRSEDRATE 60 (H) 11/02/2021   ESRSEDRATE 102 (H) 10/16/2021   CRP 0.6 11/02/2021   REPTSTATUS 07/29/2021 FINAL 07/28/2021   CULT MULTIPLE SPECIES PRESENT, SUGGEST RECOLLECTION (A) 07/28/2021     Lab Results  Component Value Date   ALBUMIN 3.6 02/12/2022   ALBUMIN 2.8 (L) 11/03/2021   ALBUMIN 3.7  08/17/2021   PREALBUMIN 18 12/09/2017    Lab Results  Component Value Date   MG 2.1 11/02/2021   MG 2.0 05/08/2021   Lab Results  Component Value Date   VD25OH 21.40 (L) 08/17/2021   VD25OH 27.99 (L) 07/09/2020   VD25OH 25.81 (L) 01/08/2019    Lab Results  Component Value Date   PREALBUMIN 18 12/09/2017      Latest Ref Rng & Units 02/12/2022   10:58 AM 11/03/2021    5:22 AM 11/02/2021    9:25 AM  CBC EXTENDED  WBC 4.0 - 10.5 K/uL 12.4  5.3  6.2   RBC 3.87 - 5.11 Mil/uL 4.78  4.69  5.16   Hemoglobin 12.0 - 15.0 g/dL 11.8  11.2  12.5   HCT 36.0 - 46.0 % 37.8  37.3  41.3   Platelets 150.0 - 400.0 K/uL 317.0  287  334   NEUT# 1.4 - 7.7 K/uL 9.7     Lymph# 0.7 - 4.0 K/uL 1.6        There is no height or weight on file to calculate BMI.  Orders:  Orders Placed This Encounter  Procedures   Ambulatory referral to Hand Surgery   No orders of the  defined types were placed in this encounter.    Procedures: No procedures performed  Clinical Data: No additional findings.  ROS:  All other systems negative, except as noted in the HPI. Review of Systems  Objective: Vital Signs: There were no vitals taken for this visit.  Specialty Comments:  No specialty comments available.  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   Colon cancer screening 02/14/2022   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[place]) 02/13/2022   Iron deficiency anemia 02/13/2022   Nausea without vomiting 02/13/2022   Pain in right hand 01/19/2022   Milium 01/15/2022   Abnormal CXR 12/11/2021   Acute right-sided thoracic back pain 12/11/2021   SK (seborrheic keratosis) 11/27/2021   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   Hypokalemia 11/03/2021   Diabetes mellitus with no complication ([place]) 32/99/2426   Vaginal candidiasis 08/06/2021   Hematuria, gross 08/06/2021   Type 2 diabetes mellitus with stage 3a  chronic kidney disease, with long-term current use of insulin ([place]) 07/10/2021   Multinodular goiter 07/10/2021   Leg cramps 05/08/2021   Spondylolisthesis at L4-L5 level 04/24/2021   COVID-19 03/23/2021   Thyroid nodule 04/11/2020   AK (actinic keratosis) 09/26/2019   Dyspnea on minimal exertion 07/13/2019   Stuttering 07/13/2019   Hypertension associated with diabetes ([place]) 07/06/2019   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[place]) 07/06/2019   Multiple thyroid nodules 03/27/2019   DVT, lower extremity, proximal, acute, right ([place]) 03/13/2019   History of pulmonary embolism 03/01/2019   Leukocytosis 03/01/2019   Breast nodule 03/01/2019   Abnormal renal finding 06/29/2018   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   Facial rash 03/01/2017   Eustachian tube disorder, bilateral 12/16/2016   Lightheadedness 12/16/2016   Esophageal dysphagia 11/22/2016   Chronic low back pain 10/07/2016   Vision changes 08/12/2016   Aortic atherosclerosis ([place]) 06/11/2016   Supraclavicular fossa fullness 06/08/2016   Family history of aortic aneurysm 08/20/2014   Vitamin D deficiency 08/20/2014   Osteopenia 11/06/2009   Hyperlipidemia associated with type 2 diabetes mellitus ([place]) 07/05/2008   INSOMNIA, CHRONIC 07/05/2008   Migraine with aura 07/05/2008   GERD 07/05/2008   Osteoarthritis 07/05/2008   MITRAL VALVE PROLAPSE, HX OF 07/05/2008   NEPHROLITHIASIS, HX OF 07/05/2008   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[place])    Clotting disorder ([place])    Complication of anesthesia    difficult waking   Diabetes mellitus without complication (HCC)    Diet and exercise controlled   Diverticulosis    DVT (deep venous thrombosis) ([place]) 02/2019   3calf 2 right lung 1 left lung   DVT (deep venous thrombosis) ([place]) 2020   right leg   Dyspnea    GERD (gastroesophageal reflux disease)     Headache    Hepatic steatosis    Hiatal hernia    History of kidney stones    Hyperlipidemia    Hypertension    MVP (mitral valve prolapse)    history of   PONV (postoperative nausea and vomiting)    Pulmonary emboli ([place]) 03/01/2019   2 in the right; 1 in the left    Family History  Problem Relation Age of Onset   Cancer Mother        bone   Hypertension Father  Mitral valve prolapse Father    Asthma Brother    Arthritis Brother    Nephrolithiasis Brother    Asthma Brother    COPD Brother    Breast cancer Maternal Aunt    Breast cancer Maternal Aunt    Colon cancer Neg Hx    Esophageal cancer Neg Hx    Inflammatory bowel disease Neg Hx    Liver disease Neg Hx    Pancreatic cancer Neg Hx    Rectal cancer Neg Hx    Stomach cancer Neg Hx     Past Surgical History:  Procedure Laterality Date   arm surgery  2012   left forearm fracture w/ plates pins screws   ARTERY BIOPSY Right 12/03/2021   Procedure: BIOPSY TEMPORAL ARTERY;  Surgeon: Algernon Huxley, MD;  Location: [facility] ORS;  Service: Vascular;  Laterality: Right;  Right temple   BREAST BIOPSY Left    BREAST SURGERY  2000   breast biopsy (benign)   CATARACT EXTRACTION Right 2005   CATARACT EXTRACTION Left 2010   LITHOTRIPSY  08/12/2008   stent placed bilaterally   PANCREAS BIOPSY  2021   PANCREAS BIOPSY  2019   PARTIAL HYSTERECTOMY     Both ovaries remain, vaginal, for mennorhagia   TONSILLECTOMY     TUBAL LIGATION  1980   Social History   Occupational History   Occupation: retired Energy manager: retired  Tobacco Use   Smoking status: Former    Packs/day: 1.00    Years: 13.00    Total pack years: 13.00    Types: Cigarettes    Quit date: 06/08/1991    Years since quitting: 30.8    Passive exposure: Never   Smokeless tobacco: Never  Vaping Use   Vaping Use: Never used  Substance and Sexual Activity   Alcohol use: No   Drug use: No   Sexual activity: Not on file

## 2022-04-19 ENCOUNTER — Encounter: Payer: Self-pay | Admitting: Internal Medicine

## 2022-04-20 MED ORDER — FREESTYLE LIBRE 3 SENSOR MISC: 1.0000 | 3 refills | Status: DC

## 2022-04-21 ENCOUNTER — Encounter: Payer: Self-pay | Admitting: Gastroenterology

## 2022-04-21 ENCOUNTER — Other Ambulatory Visit (INDEPENDENT_AMBULATORY_CARE_PROVIDER_SITE_OTHER): Payer: Medicare Other

## 2022-04-21 ENCOUNTER — Ambulatory Visit (INDEPENDENT_AMBULATORY_CARE_PROVIDER_SITE_OTHER): Payer: Medicare Other | Admitting: Gastroenterology

## 2022-04-21 VITALS — BP 118/70 | HR 77 | Ht 60.0 in | Wt 149.0 lb

## 2022-04-21 DIAGNOSIS — K861 Other chronic pancreatitis: Secondary | ICD-10-CM

## 2022-04-21 DIAGNOSIS — Z7952 Long term (current) use of systemic steroids: Secondary | ICD-10-CM

## 2022-04-21 DIAGNOSIS — Z862 Personal history of diseases of the blood and blood-forming organs and certain disorders involving the immune mechanism: Secondary | ICD-10-CM | POA: Diagnosis not present

## 2022-04-21 DIAGNOSIS — D84821 Immunodeficiency due to drugs: Secondary | ICD-10-CM

## 2022-04-21 DIAGNOSIS — K219 Gastro-esophageal reflux disease without esophagitis: Secondary | ICD-10-CM

## 2022-04-21 DIAGNOSIS — D8989 Other specified disorders involving the immune mechanism, not elsewhere classified: Secondary | ICD-10-CM | POA: Diagnosis not present

## 2022-04-21 DIAGNOSIS — T380X5A Adverse effect of glucocorticoids and synthetic analogues, initial encounter: Secondary | ICD-10-CM

## 2022-04-21 LAB — IBC + FERRITIN
Ferritin: 29.4 ng/mL (ref 10.0–291.0)
Iron: 48 ug/dL (ref 42–145)
Saturation Ratios: 15.3 % — ABNORMAL LOW (ref 20.0–50.0)
TIBC: 313.6 ug/dL (ref 250.0–450.0)
Transferrin: 224 mg/dL (ref 212.0–360.0)

## 2022-04-21 LAB — CBC
HCT: 40 % (ref 36.0–46.0)
Hemoglobin: 12.7 g/dL (ref 12.0–15.0)
MCHC: 31.9 g/dL (ref 30.0–36.0)
MCV: 81.1 fl (ref 78.0–100.0)
Platelets: 362 10*3/uL (ref 150.0–400.0)
RBC: 4.93 Mil/uL (ref 3.87–5.11)
RDW: 19.5 % — ABNORMAL HIGH (ref 11.5–15.5)
WBC: 11.9 10*3/uL — ABNORMAL HIGH (ref 4.0–10.5)

## 2022-04-21 LAB — HEPATIC FUNCTION PANEL
ALT: 15 U/L (ref 0–35)
AST: 14 U/L (ref 0–37)
Albumin: 3.7 g/dL (ref 3.5–5.2)
Alkaline Phosphatase: 75 U/L (ref 39–117)
Bilirubin, Direct: 0 mg/dL (ref 0.0–0.3)
Total Bilirubin: 0.3 mg/dL (ref 0.2–1.2)
Total Protein: 7.3 g/dL (ref 6.0–8.3)

## 2022-04-21 LAB — SEDIMENTATION RATE: Sed Rate: 75 mm/hr — ABNORMAL HIGH (ref 0–30)

## 2022-04-21 LAB — C-REACTIVE PROTEIN: CRP: <1 mg/dL (ref 0.5–20.0)

## 2022-04-21 NOTE — Progress Notes (Signed)
[place] VISIT   Primary Care Provider [name], MD [place] [address] [phone]  Patient Profile: Debra Leach is a 71 y.o. 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  The patient presents to the [facility] for an evaluation and management of problem(s) noted below:  Problem List 1. 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 Autoimmune disorder ([place])   3. 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[place])   4. History of iron deficiency anemia   5. Gastroesophageal reflux disease without esophagitis     History of Present Illness Please see prior notes for full details of HPI.  Interval History The patient returns for follow-up.  She has been doing relatively well.  She has not had any progressive nausea or vomiting or abdominal pain.  She remains on 6 mg of budesonide.  She was seen by rheumatology and it is not felt at this point that she has overt IgG related disease other than in her pancreas currently.  There is no contraindication to consider other steroid sparing agents from there and either.  We were surprised that her fecal elastase testing done recently did show that she still had appropriate enzyme production.  GI Review of Systems Positive as above Negative for dysphagia, odynophagia, pain, alteration of bowel habits, melena, hematochezia   Review of Systems General: Denies fevers/chills/weight loss unintentionally Cardiovascular: Denies chest pain Pulmonary: Denies shortness of breath Gastroenterological: See HPI Genitourinary: Denies darkened urine Hematological: Denies easy bruising/bleeding Dermatological: Denies jaundice Psychological: Mood is stable   Medications Current Outpatient  Medications  Medication Sig Dispense Refill   atorvastatin (LIPITOR) 80 MG tablet Take 1 tablet (80 mg total) by mouth at bedtime.     Blood Glucose Monitoring Suppl (ONETOUCH VERIO REFLECT) w/Device KIT 2 (two) times daily. for testing     budesonide (ENTOCORT EC) 3 MG 24 hr capsule Take 2 capsules (6 mg total) by mouth daily. 60 capsule 12   Cholecalciferol (VITAMIN D3) 1.25 MG (50000 UT) CAPS Take 1 capsule by mouth once a week. 12 capsule 3   colesevelam (WELCHOL) 625 MG tablet TAKE 3 TABLETS BY MOUTH 2 TIMES DAILY WITH A MEAL. 540 tablet 3   Continuous Blood Gluc Sensor (FREESTYLE LIBRE 3 SENSOR) MISC 1 Device by Does not apply route every 14 (fourteen) days. 6 each 3   ELIQUIS 2.5 MG TABS tablet TAKE 1 TABLET BY MOUTH 2 TIMES DAILY. 180 tablet 0   Eptinezumab-jjmr (VYEPTI) 100 MG/ML injection Inject 300 mg into the vein every 3 (three) months.     ferrous gluconate (FERGON) 324 MG tablet Take 1 tablet (324 mg total) by mouth daily with breakfast. 30 tablet 6   gabapentin (NEURONTIN) 100 MG capsule TAKE 1 CAPSULE BY MOUTH UP TO 3 TIMES DAILY AS NEEDED. 270 capsule 3   gabapentin (NEURONTIN) 300 MG capsule TAKE 2 CAPSULES BY MOUTH AT BEDTIME. 180 capsule 0   glucose blood (ONETOUCH VERIO) test strip USE TO CHECK BLOOD SUGAR TWO TIMES A DAY 200 strip 3   insulin glargine (LANTUS SOLOSTAR) 100 UNIT/ML Solostar Pen Inject 40 Units into the skin daily. 45 mL 3   Lancets (ONETOUCH DELICA PLUS [id number]) MISC USE TO CHECK BLOOD SUGAR TWO TIMES DAILY 200 each 3   omeprazole (PRILOSEC) 20 MG capsule Take 1 capsule (20 mg total) by mouth 2 (  two) times daily before a meal. before breakfast and bedtime 60 capsule 12   ondansetron (ZOFRAN) 4 MG tablet Take 1 tablet (4 mg total) by mouth every 8 (eight) hours as needed for nausea or vomiting. 20 tablet 6   potassium chloride SA (KLOR-CON M) 20 MEQ tablet TAKE 1 TABLET BY MOUTH DAILY. 90 tablet 1   repaglinide (PRANDIN) 0.5 MG tablet Take 2 tablets (1 mg  total) by mouth daily before lunch. before breakfast and lunch 180 tablet 3   UBRELVY 100 MG TABS TAKE 1 TABLET BY MOUTH AS NEEDED (MAY REPEAT DOSE AFTER 2 HOURS IF NEEDED. MAXIMUM 2 TABLETS IN 24 HOURS). 10 tablet 3   vitamin B-12 (CYANOCOBALAMIN) 1000 MCG tablet Take 1 tablet by mouth daily.     Vitamin D, Ergocalciferol, (DRISDOL) 1.25 MG (50000 UNIT) CAPS capsule Take 50,000 Units by mouth once a week.     Insulin Pen Needle 31G X 5 MM MISC 1 Device by Does not apply route daily in the afternoon. 100 each 3   No current facility-administered medications for this visit.    Allergies Allergies  Allergen Reactions   Sudafed [Pseudoephedrine Hcl] Hives, Itching and Anxiety   Atenolol    Codeine     REACTION: Migraine   Epinephrine Hives   Ibuprofen    Jardiance [Empagliflozin]     Caused uti   Penicillins Itching    Has patient had a PCN reaction causing immediate rash, facial/tongue/throat swelling, SOB or lightheadedness with hypotension: No Has patient had a PCN reaction causing severe rash involving mucus membranes or skin necrosis: No Has patient had a PCN reaction that required hospitalization: No Has patient had a PCN reaction occurring within the last 10 years: No If all of the above answers are "NO", then may proceed with Cephalosporin use.    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[place])    Clotting disorder ([place])    Complication of anesthesia    difficult waking   Diabetes mellitus without complication (HCC)    Diet and exercise controlled   Diverticulosis    DVT (deep venous thrombosis) ([place])  02/2019   3calf 2 right lung 1 left lung   DVT (deep venous thrombosis) ([place]) 2020   right leg   Dyspnea    GERD (gastroesophageal reflux disease)    Headache    Hepatic steatosis    Hiatal hernia    History of kidney stones    Hyperlipidemia    Hypertension    MVP (mitral valve prolapse)    history of   PONV (postoperative nausea and vomiting)    Pulmonary emboli ([place]) 03/01/2019   2 in the right; 1 in the left   Past Surgical History:  Procedure Laterality Date   arm surgery  2012   left forearm fracture w/ plates pins screws   ARTERY BIOPSY Right 12/03/2021   Procedure: BIOPSY TEMPORAL ARTERY;  Surgeon: Algernon Huxley, MD;  Location: [facility] ORS;  Service: Vascular;  Laterality: Right;  Right temple   BREAST BIOPSY Left    BREAST SURGERY  2000   breast biopsy (benign)   CATARACT EXTRACTION Right 2005   CATARACT EXTRACTION Left 2010   LITHOTRIPSY  08/12/2008   stent placed bilaterally   PANCREAS BIOPSY  2021   PANCREAS BIOPSY  2019   PARTIAL HYSTERECTOMY     Both ovaries remain, vaginal, for mennorhagia   TONSILLECTOMY     TUBAL LIGATION  1980   Social History   Socioeconomic History   Marital status: Married    Spouse name: Carloyn Manner   Number of children: 0   Years of education: Not on file   Highest education level: Not on file  Occupational History   Occupation: retired Energy manager: retired  Tobacco Use   Smoking status: Former    Packs/day: 1.00    Years: 13.00    Total pack years: 13.00    Types: Cigarettes    Quit date: 06/08/1991    Years since quitting: 30.8    Passive exposure: Never   Smokeless tobacco: Never  Vaping Use   Vaping Use: Never used  Substance and Sexual Activity   Alcohol use: No   Drug use: No   Sexual activity: Not on file  Other Topics Concern   Not on file  Social History Narrative   Regular exercise--yes, recumbent bike 3-4 days a week      Diet: fruits and veggies, water, eats at home, drinks a lot of milk       Patient is right-handed. She drinks 1-2 cups of coffee a day.      One story home   Social Determinants of Health   Financial Resource Strain: Low Risk  (11/20/2019)   Overall Financial Resource Strain (CARDIA)    Difficulty of Paying Living Expenses: Not hard at all  Food Insecurity: No Food Insecurity (04/06/2019)   Hunger Vital Sign    Worried About Running Out of Food in the Last Year: Never true    Ran Out of Food in the Last Year: Never true  Transportation Needs: No Transportation Needs (04/06/2019)   PRAPARE - Hydrologist (Medical): No    Lack of Transportation (Non-Medical): No  Physical Activity: Inactive (04/06/2019)   Exercise Vital Sign    Days of Exercise per Week: 0 days    Minutes of Exercise per Session: 0 min  Stress: No Stress Concern Present (04/06/2019)   [place]    Feeling of Stress : Not at all  Social Connections: Not on file  Intimate Partner Violence: Not At Risk (04/06/2019)   Humiliation, Afraid, Rape, and Kick questionnaire    Fear of Current or Ex-Partner: No    Emotionally Abused: No    Physically Abused: No    Sexually Abused: No   Family History  Problem Relation Age of Onset   Cancer Mother        bone   Hypertension Father    Mitral valve prolapse Father    Asthma Brother    Arthritis Brother    Nephrolithiasis Brother    Asthma Brother    COPD Brother    Breast cancer Maternal Aunt    Breast cancer Maternal Aunt    Colon cancer Neg Hx    Esophageal cancer Neg Hx    Inflammatory bowel disease Neg Hx    Liver disease Neg Hx    Pancreatic cancer Neg Hx    Rectal cancer Neg Hx    Stomach cancer  Neg Hx    I have reviewed her medical, social, and family history in detail and updated the electronic medical record as necessary.    PHYSICAL EXAMINATION  BP 118/70   Pulse 77   Ht 5' (1.524 m)   Wt 149 lb (67.6 kg)   SpO2 99%   BMI  29.10 kg/m  Wt Readings from Last 3 Encounters:  04/21/22 149 lb (67.6 kg)  03/01/22 151 lb 3.2 oz (68.6 kg)  02/18/22 151 lb 6.4 oz (68.7 kg)  GEN: NAD, appears stated age, doesn't appear chronically ill PSYCH: Cooperative, without pressured speech EYE: Conjunctivae pink, sclerae anicteric ENT: MMM CV: Nontachycardic RESP: No audible wheezing GI: NABS, soft, NT/ND, without rebound or guarding MSK/EXT: No pedal edema present SKIN: Cushingoid latissimus dorsi region on her shoulders/back noted; no jaundice NEURO:  Alert & Oriented x 3, no focal deficits   REVIEW OF DATA  I reviewed the following data at the time of this encounter:  GI Procedures and Studies  Still have not been able to obtain previous colonoscopy or EGD reports  Laboratory Studies  Reviewed those in epic and care everywhere  Imaging Studies  No new imaging to review   ASSESSMENT  Ms. Weinrich is a 71 y.o. 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  The patient is seen today for evaluation and management of:  1. 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 Autoimmune disorder ([place])   3. 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[place])   4. History of iron deficiency anemia   5. Gastroesophageal reflux disease without esophagitis    The patient is hemodynamically and clinically stable.  I think it is reasonable for [place] to consider initiation of an immune modulator in an effort of trying to decrease overall steroid use for this patient.  I want to see how the patient's laboratories look.  If they look well then we will consider initiating azathioprine or 6-MP and over the course of the next few months try to titrate that upwards and decrease her budesonide therapy.  If we try this and are not successful or the patient cannot tolerate  immunomodulators therapy as result of side effect profile or leukopenia or LFT abnormalities (should they develop) then I think it may be reasonable for [place] to consider rituximab and I would likely refer her to a previous colleague, Dr. Cleda Mccreedy at [facility] to help me.  I think yearly imaging of her pancreas makes sense just to ensure nothing else develops or changes.  We will continue MRI/MRCP in this fashion.  As well, periodic evaluation for exocrine pancreas insufficiency makes sense and especially if the patient has changes in her symptoms or starts to develop progressive worsening of her diabetes control.  Right now she is not experiencing dysphagia symptoms that she was having previously which is good.  We will hold on endoscopic reevaluation currently.  All patient questions were answered to the best of my ability, and the patient agrees to the aforementioned plan of action with follow-up as indicated.   PLAN  Laboratories as outlined below If labs are okay then we will initiate azathioprine (up to 2 mg/kg/day as documented in the literature) though we will begin at 50 mg - Will need at that point every 2 week CBC/CMP and if no changes after 1 month then can increase upwards and monitor closely For now continue budesonide 6  mg daily Consider role of Rituximab in future Repeat MRI/MRCP in 6/24 (yearly follow-up for her in the setting of her significant disease)   Orders Placed This Encounter  Procedures   Hepatic function panel   Sedimentation rate   C-reactive protein   IgG 4   IBC + Ferritin   CBC    New Prescriptions   No medications on file   Modified Medications   No medications on file    Planned Follow Up Return in about 3 months (around 07/22/2022).   Total Time in Face-to-Face and in Coordination of Care for patient including independent/personal interpretation/review of prior testing, medical history, examination, medication adjustment, communicating results with the  patient directly, and documentation within the EHR is 25 minutes.   Justice Britain, MD [facility] Office # [phone]

## 2022-04-21 NOTE — Patient Instructions (Addendum)
Your provider has requested that you go to the basement level for lab work before leaving today. Press "B" on the elevator. The lab is located at the first door on the left as you exit the elevator.  Once labs are back Dr Rush Landmark will decide what medication he would like to start.   You may decrease your omeprazole to once daily 30 mins before meals.   Follow up in 3 months.   _____________________________________________________  If you are age 71 or older, your body mass index should be between 23-30. Your Body mass index is 29.1 kg/m. If this is out of the aforementioned range listed, please consider follow up with your Primary Care Provider.  If you are age 22 or younger, your body mass index should be between 19-25. Your Body mass index is 29.1 kg/m. If this is out of the aformentioned range listed, please consider follow up with your Primary Care Provider.   _____________________________________________________ The [facility] providers would like to encourage you to use [facility] to communicate with providers for non-urgent requests or questions.  Due to long hold times on the telephone, sending your provider a message by [facility] may be a faster and more efficient way to get a response.  Please allow 48 business hours for a response.  Please remember that this is for non-urgent requests.  _____________________________________________________  Due to recent changes in healthcare laws, you may see the results of your imaging and laboratory studies on MyChart before your provider has had a chance to review them.  We understand that in some cases there may be results that are confusing or concerning to you. Not all laboratory results come back in the same time frame and the provider may be waiting for multiple results in order to interpret others.  Please give [place] 48 hours in order for your provider to thoroughly review all the results before contacting the office for clarification of your  results.   Thank you for choosing me and [facility].  Dr. Rush Landmark

## 2022-04-22 ENCOUNTER — Telehealth: Payer: Self-pay | Admitting: Family Medicine

## 2022-04-22 NOTE — Telephone Encounter (Signed)
Pt came in office to drop off copy of Vaccinations for PCP to review . It was place in PCP folder

## 2022-04-22 NOTE — Telephone Encounter (Signed)
Vaccine records has been updated.

## 2022-05-03 ENCOUNTER — Encounter: Payer: Medicare Other | Admitting: Internal Medicine

## 2022-05-03 ENCOUNTER — Telehealth: Payer: Self-pay

## 2022-05-03 DIAGNOSIS — D8984 IgG4-related disease: Secondary | ICD-10-CM

## 2022-05-03 NOTE — Telephone Encounter (Signed)
Looks like it was not entered correctly when the pt was here in the office. The lab cold not see the order.  She will have to come back for the test.  I will enter the order and have called the pt.

## 2022-05-03 NOTE — Telephone Encounter (Signed)
Thanks. Let her know I would like to begin azathioprine 50 mg daily. 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 Once she has been on this medication for approximately 2 weeks, she will need to have a CBC and CMP performed. If she wants to go ahead and start the medication and come in 2 weeks and have her IgG4 done at the same time that is okay as well. Thanks. GM

## 2022-05-03 NOTE — Telephone Encounter (Signed)
-----   Message from Irving Copas., MD sent at 05/03/2022  3:22 PM EST ----- Regarding: IgG4 level Debra Leach, Can you call and find out what happened with the IgG4 level not being drawn or having returned as of yet? If there are issues where it has not returned, then have the patient come in because I need to see that lab as I decide about potential 6-MP/azathioprine in the near future. Thanks. GM

## 2022-05-04 ENCOUNTER — Other Ambulatory Visit: Payer: Self-pay

## 2022-05-04 ENCOUNTER — Other Ambulatory Visit (INDEPENDENT_AMBULATORY_CARE_PROVIDER_SITE_OTHER): Payer: Medicare Other

## 2022-05-04 DIAGNOSIS — D509 Iron deficiency anemia, unspecified: Secondary | ICD-10-CM

## 2022-05-04 DIAGNOSIS — K861 Other chronic pancreatitis: Secondary | ICD-10-CM

## 2022-05-04 DIAGNOSIS — D8984 IgG4-related disease: Secondary | ICD-10-CM

## 2022-05-04 LAB — IBC + FERRITIN
Ferritin: 14.5 ng/mL (ref 10.0–291.0)
Iron: 43 ug/dL (ref 42–145)
Saturation Ratios: 13.8 % — ABNORMAL LOW (ref 20.0–50.0)
TIBC: 312.2 ug/dL (ref 250.0–450.0)
Transferrin: 223 mg/dL (ref 212.0–360.0)

## 2022-05-04 LAB — CBC WITH DIFFERENTIAL/PLATELET
Basophils Absolute: 0.1 10*3/uL (ref 0.0–0.1)
Basophils Relative: 1.1 % (ref 0.0–3.0)
Eosinophils Absolute: 0.1 10*3/uL (ref 0.0–0.7)
Eosinophils Relative: 1.2 % (ref 0.0–5.0)
HCT: 40 % (ref 36.0–46.0)
Hemoglobin: 12.6 g/dL (ref 12.0–15.0)
Lymphocytes Relative: 15.5 % (ref 12.0–46.0)
Lymphs Abs: 1.6 10*3/uL (ref 0.7–4.0)
MCHC: 31.5 g/dL (ref 30.0–36.0)
MCV: 82.6 fl (ref 78.0–100.0)
Monocytes Absolute: 0.8 10*3/uL (ref 0.1–1.0)
Monocytes Relative: 7.5 % (ref 3.0–12.0)
Neutro Abs: 7.6 10*3/uL (ref 1.4–7.7)
Neutrophils Relative %: 74.7 % (ref 43.0–77.0)
Platelets: 324 10*3/uL (ref 150.0–400.0)
RBC: 4.84 Mil/uL (ref 3.87–5.11)
RDW: 19.4 % — ABNORMAL HIGH (ref 11.5–15.5)
WBC: 10.2 10*3/uL (ref 4.0–10.5)

## 2022-05-04 MED ORDER — AZATHIOPRINE 50 MG PO TABS: 50.0000 mg | ORAL_TABLET | Freq: Every day | ORAL | 6 refills | Status: DC

## 2022-05-04 NOTE — Telephone Encounter (Signed)
Left message on machine to call back  

## 2022-05-04 NOTE — Telephone Encounter (Signed)
The pt has been advised and will begin medication.  She will come in 2 weeks for repeat labs.  Order entered and prescription sent

## 2022-05-06 LAB — IGG 4: IgG, Subclass 4: 334 mg/dL — ABNORMAL HIGH (ref 2–96)

## 2022-05-10 ENCOUNTER — Encounter: Payer: Medicare Other | Admitting: Internal Medicine

## 2022-05-11 ENCOUNTER — Ambulatory Visit (INDEPENDENT_AMBULATORY_CARE_PROVIDER_SITE_OTHER): Payer: Medicare Other | Admitting: Family Medicine

## 2022-05-11 ENCOUNTER — Encounter: Payer: Self-pay | Admitting: Family Medicine

## 2022-05-11 VITALS — BP 148/82 | HR 84 | Temp 97.5°F | Ht 61.0 in | Wt 147.0 lb

## 2022-05-11 DIAGNOSIS — Z86711 Personal history of pulmonary embolism: Secondary | ICD-10-CM | POA: Diagnosis not present

## 2022-05-11 DIAGNOSIS — Z794 Long term (current) use of insulin: Secondary | ICD-10-CM

## 2022-05-11 DIAGNOSIS — D84821 Immunodeficiency due to drugs: Secondary | ICD-10-CM

## 2022-05-11 DIAGNOSIS — D509 Iron deficiency anemia, unspecified: Secondary | ICD-10-CM

## 2022-05-11 DIAGNOSIS — T380X5A Adverse effect of glucocorticoids and synthetic analogues, initial encounter: Secondary | ICD-10-CM

## 2022-05-11 DIAGNOSIS — E1122 Type 2 diabetes mellitus with diabetic chronic kidney disease: Secondary | ICD-10-CM | POA: Diagnosis not present

## 2022-05-11 DIAGNOSIS — Z01818 Encounter for other preprocedural examination: Secondary | ICD-10-CM | POA: Diagnosis not present

## 2022-05-11 DIAGNOSIS — N1831 Chronic kidney disease, stage 3a: Secondary | ICD-10-CM | POA: Diagnosis not present

## 2022-05-11 DIAGNOSIS — K861 Other chronic pancreatitis: Secondary | ICD-10-CM

## 2022-05-11 DIAGNOSIS — Z7952 Long term (current) use of systemic steroids: Secondary | ICD-10-CM

## 2022-05-11 LAB — POCT GLYCOSYLATED HEMOGLOBIN (HGB A1C): HbA1c, POC (controlled diabetic range): 7.1 % — AB (ref 0.0–7.0)

## 2022-05-11 NOTE — Assessment & Plan Note (Signed)
Chronic, significant improvement on today's A1c.  At adequate level for ideal wound healing.  In conversation with GI, Dr. Rush Landmark, the plan will be to start azathioprine  and wean off budesonide

## 2022-05-11 NOTE — Progress Notes (Signed)
Patient ID: Debra Leach, female    DOB: [birth year], 71 y.o.   MRN: [id number]  This visit was conducted in person.  BP (!) 148/82   Pulse 84   Temp (!) 97.5 F (36.4 C)   Ht _0  (1.549 m)   Wt 147 lb (66.7 kg)   SpO2 98%   BMI 27.78 kg/m    CC:  Chief Complaint  Patient presents with   Medical Clearance    Pt is having hand sx pending office visit.     Subjective:   HPI: Debra Leach is a 71 y.o. female with history of aortic atherosclerosis, diabetes, hypertension hyperlipidemia and autoimmune pancreatitis presenting on 05/11/2022 for Medical Clearance (Pt is having hand sx pending office visit. )  She is scheduled for a right middle finger sagittal band repair or reconstruction by Dr. Audria Nine.  Plan regional anesthesia. She is okay waiting to try to get A1C improved prior to surgery.  Not in severe pain.   Lab Results  Component Value Date   HGBA1C 8.0 (A) 02/18/2022     Hypertension:  Borderline but tolerable blood pressure control in office today. BP Readings from Last 3 Encounters:  05/11/22 (!) 148/82  04/21/22 118/70  03/01/22 (!) 166/87  Using medication without problems or lightheadedness:  Chest pain with exertion: Edema: Short of breath: Average home BPs: Other issues: She is able to do greater than 4 METS of exertional activity without shortness of breath  Autoimmune pancreatitis: Followed by GI, planning start of azathioprine.  Plan is to try to taper the budesonide.  Aortic atherosclerosis and hyperlipidemia, increased CVD risk, unable to tolerate statin The 10-year ASCVD risk score (Arnett DK, et al., 2019) is: 25.4%   Values used to calculate the score:     Age: 89 years     Sex: Female     Is Non-Hispanic African American: No     Diabetic: Yes     Tobacco smoker: No     Systolic Blood Pressure: 885 mmHg     Is BP treated: No     HDL Cholesterol: 59.9 mg/dL     Total Cholesterol: 229 mg/dL   Iron  deficiency anemia, history of essential thrombocytopenia Lab Results  Component Value Date   WBC 10.2 05/04/2022   HGB 12.6 05/04/2022   HCT 40.0 05/04/2022   MCV 82.6 05/04/2022   PLT 324.0 05/04/2022   Hx of DVT, PE  On eliquis, lifelong      Relevant past medical, surgical, family and social history reviewed and updated as indicated. Interim medical history since our last visit reviewed. Allergies and medications reviewed and updated. Outpatient Medications Prior to Visit  Medication Sig Dispense Refill   atorvastatin (LIPITOR) 80 MG tablet Take 1 tablet (80 mg total) by mouth at bedtime.     azaTHIOprine (IMURAN) 50 MG tablet Take 1 tablet (50 mg total) by mouth daily. 30 tablet 6   Blood Glucose Monitoring Suppl (ONETOUCH VERIO REFLECT) w/Device KIT 2 (two) times daily. for testing     budesonide (ENTOCORT EC) 3 MG 24 hr capsule Take 2 capsules (6 mg total) by mouth daily. 60 capsule 12   Cholecalciferol (VITAMIN D3) 1.25 MG (50000 UT) CAPS Take 1 capsule by mouth once a week. 12 capsule 3   colesevelam (WELCHOL) 625 MG tablet TAKE 3 TABLETS BY MOUTH 2 TIMES DAILY WITH A MEAL. 540 tablet 3   Continuous Blood Gluc Sensor (FREESTYLE LIBRE 3 SENSOR) MISC  1 Device by Does not apply route every 14 (fourteen) days. 6 each 3   ELIQUIS 2.5 MG TABS tablet TAKE 1 TABLET BY MOUTH 2 TIMES DAILY. 180 tablet 0   ferrous gluconate (FERGON) 324 MG tablet Take 1 tablet (324 mg total) by mouth daily with breakfast. 30 tablet 6   gabapentin (NEURONTIN) 100 MG capsule TAKE 1 CAPSULE BY MOUTH UP TO 3 TIMES DAILY AS NEEDED. 270 capsule 3   gabapentin (NEURONTIN) 300 MG capsule TAKE 2 CAPSULES BY MOUTH AT BEDTIME. 180 capsule 0   glucose blood (ONETOUCH VERIO) test strip USE TO CHECK BLOOD SUGAR TWO TIMES A DAY 200 strip 3   insulin glargine (LANTUS SOLOSTAR) 100 UNIT/ML Solostar Pen Inject 40 Units into the skin daily. 45 mL 3   Lancets (ONETOUCH DELICA PLUS [id number]) MISC USE TO CHECK BLOOD SUGAR TWO  TIMES DAILY 200 each 3   omeprazole (PRILOSEC) 20 MG capsule Take 1 capsule (20 mg total) by mouth 2 (two) times daily before a meal. before breakfast and bedtime (Patient taking differently: Take 20 mg by mouth 2 (two) times daily before a meal. before breakfast and bedtime, pt reports that she  taking once a ay) 60 capsule 12   ondansetron (ZOFRAN) 4 MG tablet Take 1 tablet (4 mg total) by mouth every 8 (eight) hours as needed for nausea or vomiting. 20 tablet 6   potassium chloride SA (KLOR-CON M) 20 MEQ tablet TAKE 1 TABLET BY MOUTH DAILY. 90 tablet 1   UBRELVY 100 MG TABS TAKE 1 TABLET BY MOUTH AS NEEDED (MAY REPEAT DOSE AFTER 2 HOURS IF NEEDED. MAXIMUM 2 TABLETS IN 24 HOURS). 10 tablet 3   vitamin B-12 (CYANOCOBALAMIN) 1000 MCG tablet Take 1 tablet by mouth daily.     Eptinezumab-jjmr (VYEPTI) 100 MG/ML injection Inject 300 mg into the vein every 3 (three) months.     Insulin Pen Needle 31G X 5 MM MISC 1 Device by Does not apply route daily in the afternoon. 100 each 3   repaglinide (PRANDIN) 0.5 MG tablet Take 2 tablets (1 mg total) by mouth daily before lunch. before breakfast and lunch 180 tablet 3   Vitamin D, Ergocalciferol, (DRISDOL) 1.25 MG (50000 UNIT) CAPS capsule Take 50,000 Units by mouth once a week.     No facility-administered medications prior to visit.     Per HPI unless specifically indicated in ROS section below Review of Systems  Constitutional:  Negative for fatigue and fever.  HENT:  Negative for congestion.   Eyes:  Negative for pain.  Respiratory:  Negative for cough and shortness of breath.   Cardiovascular:  Negative for chest pain, palpitations and leg swelling.  Gastrointestinal:  Negative for abdominal pain.  Genitourinary:  Negative for dysuria and vaginal bleeding.  Musculoskeletal:  Negative for back pain.  Neurological:  Negative for syncope, light-headedness and headaches.  Psychiatric/Behavioral:  Negative for dysphoric mood.    Objective:  BP (!)  148/82   Pulse 84   Temp (!) 97.5 F (36.4 C)   Ht _0  (1.549 m)   Wt 147 lb (66.7 kg)   SpO2 98%   BMI 27.78 kg/m   Wt Readings from Last 3 Encounters:  05/11/22 147 lb (66.7 kg)  04/21/22 149 lb (67.6 kg)  03/01/22 151 lb 3.2 oz (68.6 kg)      Physical Exam Constitutional:      General: She is not in acute distress.    Appearance: Normal appearance. She is well-developed.  She is not ill-appearing or toxic-appearing.  HENT:     Head: Normocephalic.     Right Ear: Hearing, tympanic membrane, ear canal and external ear normal. Tympanic membrane is not erythematous, retracted or bulging.     Left Ear: Hearing, tympanic membrane, ear canal and external ear normal. Tympanic membrane is not erythematous, retracted or bulging.     Nose: No mucosal edema or rhinorrhea.     Right Sinus: No maxillary sinus tenderness or frontal sinus tenderness.     Left Sinus: No maxillary sinus tenderness or frontal sinus tenderness.     Mouth/Throat:     Pharynx: Uvula midline.  Eyes:     General: Lids are normal. Lids are everted, no foreign bodies appreciated.     Conjunctiva/sclera: Conjunctivae normal.     Pupils: Pupils are equal, round, and reactive to light.  Neck:     Thyroid: No thyroid mass or thyromegaly.     Vascular: No carotid bruit.     Trachea: Trachea normal.  Cardiovascular:     Rate and Rhythm: Normal rate and regular rhythm.     Pulses: Normal pulses.     Heart sounds: Normal heart sounds, S1 normal and S2 normal. No murmur heard.    No friction rub. No gallop.  Pulmonary:     Effort: Pulmonary effort is normal. No tachypnea or respiratory distress.     Breath sounds: Normal breath sounds. No decreased breath sounds, wheezing, rhonchi or rales.  Abdominal:     General: Bowel sounds are normal.     Palpations: Abdomen is soft.     Tenderness: There is no abdominal tenderness.  Musculoskeletal:     Cervical back: Normal range of motion and neck supple.  Skin:     General: Skin is warm and dry.     Findings: No rash.  Neurological:     Mental Status: She is alert.  Psychiatric:        Mood and Affect: Mood is not anxious or depressed.        Speech: Speech normal.        Behavior: Behavior normal. Behavior is cooperative.        Thought Content: Thought content normal.        Judgment: Judgment normal.       Results for orders placed or performed in visit on 05/04/22  IBC + Ferritin  Result Value Ref Range   Iron 43 42 - 145 ug/dL   Transferrin 223.0 212.0 - 360.0 mg/dL   Saturation Ratios 13.8 (L) 20.0 - 50.0 %   Ferritin 14.5 10.0 - 291.0 ng/mL   TIBC 312.2 250.0 - 450.0 mcg/dL  CBC with Differential/Platelet  Result Value Ref Range   WBC 10.2 4.0 - 10.5 K/uL   RBC 4.84 3.87 - 5.11 Mil/uL   Hemoglobin 12.6 12.0 - 15.0 g/dL   HCT 40.0 36.0 - 46.0 %   MCV 82.6 78.0 - 100.0 fl   MCHC 31.5 30.0 - 36.0 g/dL   RDW 19.4 (H) 11.5 - 15.5 %   Platelets 324.0 150.0 - 400.0 K/uL   Neutrophils Relative % 74.7 43.0 - 77.0 %   Lymphocytes Relative 15.5 12.0 - 46.0 %   Monocytes Relative 7.5 3.0 - 12.0 %   Eosinophils Relative 1.2 0.0 - 5.0 %   Basophils Relative 1.1 0.0 - 3.0 %   Neutro Abs 7.6 1.4 - 7.7 K/uL   Lymphs Abs 1.6 0.7 - 4.0 K/uL   Monocytes Absolute 0.8  0.1 - 1.0 K/uL   Eosinophils Absolute 0.1 0.0 - 0.7 K/uL   Basophils Absolute 0.1 0.0 - 0.1 K/uL  IgG 4  Result Value Ref Range   IgG, Subclass 4 334 (H) 2 - 96 mg/dL     COVID 19 screen:  No recent travel or known exposure to COVID19 The patient denies respiratory symptoms of COVID 19 at this time. The importance of social distancing was discussed today.   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 Per Dr. Rush Landmark,  given IgG4 related disease she will likely need some amount of budesonide long-term.      History of  pulmonary embolism   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HCC)   Iron deficiency anemia    Resolved at last check.      Pre-operative examination - Primary    Low to moderate risk surgery and moderate risk patient. She will hold Eliquis 2 days prior to surgery and will make sure she is active as early as possible which should be no problem given the symptoms regional anesthesia on right arm.  No red flags to cardiovascular rate.  She is able to do at least 4 METS of activity without symptoms. Procedure: Include intubation.        Type 2 diabetes mellitus with stage 3a chronic kidney disease, with long-term current use of insulin (HCC)    Chronic, significant improvement on today's A1c.  At adequate level for ideal wound healing.  In conversation with GI, Dr. Rush Landmark, the plan will be to start azathioprine  and wean off budesonide      Relevant Orders   HgB A1c (Completed)       Eliezer Lofts, MD

## 2022-05-11 NOTE — Assessment & Plan Note (Signed)
Chronic, GI plans to start immunosuppressants and work on weaning off budesonide. Per Dr. Rush Landmark,  given IgG4 related disease she will likely need some amount of budesonide long-term.

## 2022-05-11 NOTE — Assessment & Plan Note (Signed)
Resolved at last check.

## 2022-05-11 NOTE — Assessment & Plan Note (Signed)
Low to moderate risk surgery and moderate risk patient. She will hold Eliquis 2 days prior to surgery and will make sure she is active as early as possible which should be no problem given the symptoms regional anesthesia on right arm.  No red flags to cardiovascular rate.  She is able to do at least 4 METS of activity without symptoms. Procedure: Include intubation.

## 2022-05-12 ENCOUNTER — Encounter (HOSPITAL_BASED_OUTPATIENT_CLINIC_OR_DEPARTMENT_OTHER): Payer: Self-pay

## 2022-05-12 ENCOUNTER — Other Ambulatory Visit: Payer: Self-pay

## 2022-05-12 ENCOUNTER — Emergency Department (HOSPITAL_BASED_OUTPATIENT_CLINIC_OR_DEPARTMENT_OTHER)
Admission: EM | Admit: 2022-05-12 | Discharge: 2022-05-12 | Disposition: A | Discharge status: Home / Self Care | Payer: Medicare Other | Attending: Emergency Medicine | Admitting: Emergency Medicine

## 2022-05-12 DIAGNOSIS — Z794 Long term (current) use of insulin: Secondary | ICD-10-CM | POA: Diagnosis not present

## 2022-05-12 DIAGNOSIS — I10 Essential (primary) hypertension: Secondary | ICD-10-CM | POA: Diagnosis not present

## 2022-05-12 DIAGNOSIS — Z7984 Long term (current) use of oral hypoglycemic drugs: Secondary | ICD-10-CM | POA: Insufficient documentation

## 2022-05-12 DIAGNOSIS — Z7901 Long term (current) use of anticoagulants: Secondary | ICD-10-CM | POA: Insufficient documentation

## 2022-05-12 DIAGNOSIS — E162 Hypoglycemia, unspecified: Secondary | ICD-10-CM | POA: Diagnosis present

## 2022-05-12 DIAGNOSIS — Z79899 Other long term (current) drug therapy: Secondary | ICD-10-CM | POA: Insufficient documentation

## 2022-05-12 DIAGNOSIS — E11649 Type 2 diabetes mellitus with hypoglycemia without coma: Secondary | ICD-10-CM | POA: Diagnosis not present

## 2022-05-12 LAB — CBC
HCT: 42.5 % (ref 36.0–46.0)
Hemoglobin: 13.2 g/dL (ref 12.0–15.0)
MCH: 26.1 pg (ref 26.0–34.0)
MCHC: 31.1 g/dL (ref 30.0–36.0)
MCV: 84 fL (ref 80.0–100.0)
Platelets: 300 10*3/uL (ref 150–400)
RBC: 5.06 MIL/uL (ref 3.87–5.11)
RDW: 19.1 % — ABNORMAL HIGH (ref 11.5–15.5)
WBC: 12.5 10*3/uL — ABNORMAL HIGH (ref 4.0–10.5)
nRBC: 0 % (ref 0.0–0.2)

## 2022-05-12 LAB — BASIC METABOLIC PANEL
Anion gap: 12 (ref 5–15)
BUN: 11 mg/dL (ref 8–23)
CO2: 24 mmol/L (ref 22–32)
Calcium: 9.2 mg/dL (ref 8.9–10.3)
Chloride: 106 mmol/L (ref 98–111)
Creatinine, Ser: 0.94 mg/dL (ref 0.44–1.00)
GFR, Estimated: >60 mL/min (ref >60)
Glucose, Bld: 72 mg/dL (ref 70–99)
Potassium: 4.8 mmol/L (ref 3.5–5.1)
Sodium: 142 mmol/L (ref 135–145)

## 2022-05-12 LAB — URINALYSIS, ROUTINE W REFLEX MICROSCOPIC
Bilirubin Urine: NEGATIVE
Glucose, UA: NEGATIVE mg/dL
Hgb urine dipstick: NEGATIVE
Ketones, ur: NEGATIVE mg/dL
Nitrite: NEGATIVE
Protein, ur: NEGATIVE mg/dL
Specific Gravity, Urine: <1.005 — ABNORMAL LOW (ref 1.005–1.030)
pH: 5 (ref 5.0–8.0)

## 2022-05-12 LAB — CBG MONITORING, ED
Glucose-Capillary: 73 mg/dL (ref 70–99)
Glucose-Capillary: 122 mg/dL — ABNORMAL HIGH (ref 70–99)
Glucose-Capillary: 124 mg/dL — ABNORMAL HIGH (ref 70–99)

## 2022-05-12 MED ORDER — ACETAMINOPHEN 325 MG PO TABS
650.0000 mg | ORAL_TABLET | Freq: Once | ORAL | Status: AC
  Administered 2022-05-12: 650 mg via ORAL
  Filled 2022-05-12: qty 2

## 2022-05-12 NOTE — ED Triage Notes (Signed)
Patient BIB [facility] from Home.  Endorses awakening at 0500 today per Normal and she felt her BG was Low as she felt weak. CBG was 53 and she drank some Juice and it increased to 124. Then it decreased again to 47.  [facility] responded and provided a snack and it increased to 97.   VSS with EMS besides HTN at 654-650 Systolic.   NAD Noted during Triage. A&Ox4. GCS 15. BIB Wheelchair.

## 2022-05-12 NOTE — ED Notes (Signed)
Pt given discharge instructions. Opportunities given for questions. Pt verbalizes understanding. Laveyah Oriol R, RN 

## 2022-05-12 NOTE — ED Provider Notes (Signed)
[facility] EMERGENCY DEPT Provider Note   CSN: [id number] Arrival date & time: 05/12/22  1057     History  Chief Complaint  Patient presents with   Hypoglycemia    Debra Leach is a 71 y.o. female.  Patient is a 71 year old diabetic female on insulin presenting for hypoglycemia.  Patient states she awoke in normal state this morning but began to feel weak and checked her home glucose that read 53.  She states she drank some orange juice and he repeated her glucose and it went up to 124.  States she ate breakfast including toast and eggs.  Stated an hour later she began to have symptoms again and was down to 47.  When EMS arrived patient had already had another snack.  Glucose was 97.  On arrival to ED glucose was 73 and repeat 2 hours later as was 122.  Patient denies any recent illness.  Denies fevers, chills, nausea, vomiting, diarrhea, coughing, or dysuria.  Denies sick contacts.  Denies lightheadedness or dizziness at this time.  The history is provided by the patient. No language interpreter was used.  Hypoglycemia Associated symptoms: no seizures, no shortness of breath and no vomiting        Home Medications Prior to Admission medications   Medication Sig Start Date End Date Taking? Authorizing Provider  atorvastatin (LIPITOR) 80 MG tablet Take 1 tablet (80 mg total) by mouth at bedtime. 11/03/21   Jennye Boroughs, MD  azaTHIOprine (IMURAN) 50 MG tablet Take 1 tablet (50 mg total) by mouth daily. 05/04/22   Mansouraty, Telford Nab., MD  Blood Glucose Monitoring Suppl (ONETOUCH VERIO REFLECT) w/Device KIT 2 (two) times daily. for testing 10/25/19   [provider]  budesonide (ENTOCORT EC) 3 MG 24 hr capsule Take 2 capsules (6 mg total) by mouth daily. 02/25/22   Mansouraty, Telford Nab., MD  Cholecalciferol (VITAMIN D3) 1.25 MG (50000 UT) CAPS Take 1 capsule by mouth once a week. 08/20/21   Bedsole, Amy E, MD  colesevelam (WELCHOL) 625 MG tablet  TAKE 3 TABLETS BY MOUTH 2 TIMES DAILY WITH A MEAL. 08/31/21   Bedsole, Amy E, MD  Continuous Blood Gluc Sensor (FREESTYLE LIBRE 3 SENSOR) MISC 1 Device by Does not apply route every 14 (fourteen) days. 04/20/22   Shamleffer, Melanie Crazier, MD  ELIQUIS 2.5 MG TABS tablet TAKE 1 TABLET BY MOUTH 2 TIMES DAILY. 03/15/22   Bedsole, Amy E, MD  Eptinezumab-jjmr (VYEPTI) 100 MG/ML injection Inject 300 mg into the vein every 3 (three) months.    [provider]  ferrous gluconate (FERGON) 324 MG tablet Take 1 tablet (324 mg total) by mouth daily with breakfast. 02/15/22   Mansouraty, Telford Nab., MD  gabapentin (NEURONTIN) 100 MG capsule TAKE 1 CAPSULE BY MOUTH UP TO 3 TIMES DAILY AS NEEDED. 10/07/21   Tomi Likens, Adam R, DO  gabapentin (NEURONTIN) 300 MG capsule TAKE 2 CAPSULES BY MOUTH AT BEDTIME. 03/15/22   Jaffe, Adam R, DO  glucose blood (ONETOUCH VERIO) test strip USE TO CHECK BLOOD SUGAR TWO TIMES A DAY 05/08/21   Bedsole, Amy E, MD  insulin glargine (LANTUS SOLOSTAR) 100 UNIT/ML Solostar Pen Inject 40 Units into the skin daily. 02/18/22   Shamleffer, Melanie Crazier, MD  Insulin Pen Needle 31G X 5 MM MISC 1 Device by Does not apply route daily in the afternoon. 02/18/22   Shamleffer, Melanie Crazier, MD  Lancets (ONETOUCH DELICA PLUS [id number]) MISC USE TO CHECK BLOOD SUGAR TWO TIMES DAILY 05/08/21  Bedsole, Amy E, MD  omeprazole (PRILOSEC) 20 MG capsule Take 1 capsule (20 mg total) by mouth 2 (two) times daily before a meal. before breakfast and bedtime Patient taking differently: Take 20 mg by mouth 2 (two) times daily before a meal. before breakfast and bedtime, pt reports that she  taking once a ay 02/25/22   Mansouraty, Telford Nab., MD  ondansetron (ZOFRAN) 4 MG tablet Take 1 tablet (4 mg total) by mouth every 8 (eight) hours as needed for nausea or vomiting. 02/12/22   Mansouraty, Telford Nab., MD  potassium chloride SA (KLOR-CON M) 20 MEQ tablet TAKE 1 TABLET BY MOUTH DAILY. 03/15/22   Bedsole, Amy E, MD   repaglinide (PRANDIN) 0.5 MG tablet Take 2 tablets (1 mg total) by mouth daily before lunch. before breakfast and lunch 02/18/22   Shamleffer, Melanie Crazier, MD  UBRELVY 100 MG TABS TAKE 1 TABLET BY MOUTH AS NEEDED (MAY REPEAT DOSE AFTER 2 HOURS IF NEEDED. MAXIMUM 2 TABLETS IN 24 HOURS). 03/27/20   Pieter Partridge, DO  vitamin B-12 (CYANOCOBALAMIN) 1000 MCG tablet Take 1 tablet by mouth daily. 08/15/20   [provider]      Allergies    Sudafed [pseudoephedrine hcl], Atenolol, Codeine, Epinephrine, Ibuprofen, Jardiance [empagliflozin], Penicillins, Pentazocine lactate, Promethazine, Propranolol, Talwin [pentazocine], and Crestor [rosuvastatin]    Review of Systems   Review of Systems  Constitutional:  Negative for chills and fever.  HENT:  Negative for ear pain and sore throat.   Eyes:  Negative for pain and visual disturbance.  Respiratory:  Negative for cough and shortness of breath.   Cardiovascular:  Negative for chest pain and palpitations.  Gastrointestinal:  Negative for abdominal pain and vomiting.  Genitourinary:  Negative for dysuria and hematuria.  Musculoskeletal:  Negative for arthralgias and back pain.  Skin:  Negative for color change and rash.  Neurological:  Positive for light-headedness. Negative for seizures and syncope.  All other systems reviewed and are negative.   Physical Exam Updated Vital Signs BP (!) 145/68   Pulse 79   Temp 98.2 F (36.8 C) (Oral)   Resp 17   Ht _0  (1.549 m)   Wt 66.7 kg   SpO2 98%   BMI 27.78 kg/m  Physical Exam Vitals and nursing note reviewed.  Constitutional:      General: She is not in acute distress.    Appearance: She is well-developed.  HENT:     Head: Normocephalic and atraumatic.  Eyes:     Conjunctiva/sclera: Conjunctivae normal.  Cardiovascular:     Rate and Rhythm: Normal rate and regular rhythm.     Heart sounds: No murmur heard. Pulmonary:     Effort: Pulmonary effort is normal. No respiratory  distress.     Breath sounds: Normal breath sounds.  Abdominal:     Palpations: Abdomen is soft.     Tenderness: There is no abdominal tenderness.  Musculoskeletal:        General: No swelling.     Cervical back: Neck supple.  Skin:    General: Skin is warm and dry.     Capillary Refill: Capillary refill takes less than 2 seconds.  Neurological:     Mental Status: She is alert.  Psychiatric:        Mood and Affect: Mood normal.     ED Results / Procedures / Treatments   Labs (all labs ordered are listed, but only abnormal results are displayed) Labs Reviewed  CBC - Abnormal; Notable  for the following components:      Result Value   WBC 12.5 (*)    RDW 19.1 (*)    All other components within normal limits  URINALYSIS, ROUTINE W REFLEX MICROSCOPIC - Abnormal; Notable for the following components:   Color, Urine COLORLESS (*)    APPearance HAZY (*)    Specific Gravity, Urine <1.005 (*)    Leukocytes,Ua SMALL (*)    Bacteria, UA RARE (*)    All other components within normal limits  CBG MONITORING, ED - Abnormal; Notable for the following components:   Glucose-Capillary 122 (*)    All other components within normal limits  CBG MONITORING, ED - Abnormal; Notable for the following components:   Glucose-Capillary 124 (*)    All other components within normal limits  BASIC METABOLIC PANEL  CBG MONITORING, ED    EKG None  Radiology No results found.  Procedures Procedures    Medications Ordered in ED Medications  acetaminophen (TYLENOL) tablet 650 mg (650 mg Oral Given 05/12/22 1501)    ED Course/ Medical Decision Making/ A&P                           Medical Decision Making Amount and/or Complexity of Data Reviewed Labs: ordered. ECG/medicine tests: ordered.  Risk OTC drugs.   90:59 PM 71 year old diabetic female on insulin presenting for hypoglycemia.  Patient is alert oriented x3, no acute distress, afebrile, so vital signs.  Patient has had a total of 3  glucose checks over the last 4.5 hours that she has been in the emergency department all within normal limits.  She is able to tolerate food without difficulty.  She has no signs or symptoms of sepsis or illness.  All of her blood work is coming back without any acute critical findings.  Stable for discharge with close follow-up with PCP further management of her diabetes and insulin regimen.  Patient in no distress and overall condition improved here in the ED. Detailed discussions were had with the patient regarding current findings, and need for close f/u with PCP or on call doctor. The patient has been instructed to return immediately if the symptoms worsen in any way for re-evaluation. Patient verbalized understanding and is in agreement with current care plan. All questions answered prior to discharge.         Final Clinical Impression(s) / ED Diagnoses Final diagnoses:  Hypoglycemia    Rx / DC Orders ED Discharge Orders     None         Lianne Cure, DO 56/31/49 1541

## 2022-05-12 NOTE — ED Notes (Signed)
Pt having hypoglycemic episodes. Offered crackers, cheese and ginger ale. Will recheck blood sugar in an hour. Pt also given Tylenol for headache.

## 2022-05-13 ENCOUNTER — Encounter: Payer: Self-pay | Admitting: Internal Medicine

## 2022-05-13 ENCOUNTER — Ambulatory Visit (INDEPENDENT_AMBULATORY_CARE_PROVIDER_SITE_OTHER): Payer: Medicare Other | Admitting: Internal Medicine

## 2022-05-13 ENCOUNTER — Encounter: Payer: Self-pay | Admitting: Family Medicine

## 2022-05-13 VITALS — BP 138/86 | HR 90 | Ht 61.0 in | Wt 147.0 lb

## 2022-05-13 DIAGNOSIS — E1165 Type 2 diabetes mellitus with hyperglycemia: Secondary | ICD-10-CM

## 2022-05-13 DIAGNOSIS — E1122 Type 2 diabetes mellitus with diabetic chronic kidney disease: Secondary | ICD-10-CM

## 2022-05-13 DIAGNOSIS — N1831 Chronic kidney disease, stage 3a: Secondary | ICD-10-CM

## 2022-05-13 DIAGNOSIS — Z794 Long term (current) use of insulin: Secondary | ICD-10-CM

## 2022-05-13 LAB — POCT GLUCOSE (DEVICE FOR HOME USE): POC Glucose: 99 mg/dl (ref 70–99)

## 2022-05-13 MED ORDER — REPAGLINIDE 0.5 MG PO TABS: 1.0000 mg | ORAL_TABLET | Freq: Every day | ORAL | 3 refills | Status: DC

## 2022-05-13 MED ORDER — LANTUS SOLOSTAR 100 UNIT/ML ~~LOC~~ SOPN: 30.0000 [IU] | PEN_INJECTOR | Freq: Every day | SUBCUTANEOUS | 3 refills | Status: DC

## 2022-05-13 NOTE — Patient Instructions (Addendum)
-   Decrease  Lantus 30 units daily  - Continue Repaglinide 0.5 mg , 2 tablets before  Lunch     HOW TO TREAT LOW BLOOD SUGARS (Blood sugar LESS THAN 70 MG/DL) Please follow the RULE OF 15 for the treatment of hypoglycemia treatment (when your (blood sugars are less than 70 mg/dL)   STEP 1: Take 15 grams of carbohydrates when your blood sugar is low, which includes:  3-4 GLUCOSE TABS  OR 3-4 OZ OF JUICE OR REGULAR SODA OR ONE TUBE OF GLUCOSE GEL    STEP 2: RECHECK blood sugar in 15 MINUTES STEP 3: If your blood sugar is still low at the 15 minute recheck --> then, go back to STEP 1 and treat AGAIN with another 15 grams of carbohydrates.

## 2022-05-13 NOTE — Progress Notes (Signed)
Name: Debra Leach  MRN/ DOB: [id number], 1951/04/17   Age/ Sex: 71 y.o., female    PCP: [name], MD   Reason for Endocrinology Evaluation: Type 2 Diabetes Mellitus     Date of Initial Endocrinology Visit: 07/10/2021    PATIENT IDENTIFIER: [name] is a 71 y.o. female with a past medical history of DM, renal stones, Autoimmune pancreatitis . The patient presented for initial endocrinology clinic visit on 07/10/2021 for consultative assistance with her diabetes management.    HPI: [name] was    Diagnosed with DM 2012 Prior Medications tried/Intolerance: Metformin- GI side effects .  Currently checking blood sugars 2 x / day Hemoglobin A1c has ranged from 6.7% in 2019, peaking at 9.5% in 2022.    Has limited diet due to migraine headaches she has to eat every 3 hours otherwise her headaches will worsen  She is on long-term glucocorticoids for autoimmune pancreatitis for a long term  started  on 08/2017   On her initial visit to our clinic she had an A1c of 9.5%, she is intolerant to Jardiance due to recurrent UTIs.  We started her on repaglinide  She was also diagnosed with MNG a few years ago with last ultrasound 04/2020, thyroid nodules did not meet criteria for further follow-up.    SUBJECTIVE:   During the last visit (02/18/2022): A1c 8.0%     Today (05/13/22): [name] is here for follow-up on diabetes management.  She had a recent ED visit for hypoglycemia.   She checks her blood sugars multiple times daily, through freestyle libre . The patient has had hypoglycemic episodes since the last clinic visit.   She continues to follow-up with GI for autoimmune pancreatitis She has chronic nausea   She has ruptured right 3rd middle finger tissue    HOME DIABETES REGIMEN: Lantus 40 units daily  Repaglinide 0.5 mg, 2 tabs before lunch     Statin: INtolerant to crestor  ACE-I/ARB: yes Prior Diabetic Education: no     GLUCOSE LOG:   Fasting 84- 159 Lunch 70 -193 Supper  145- 250        DIABETIC COMPLICATIONS: Microvascular complications:  CKDIII Denies: Neuropathy, retinopathy Last eye exam: Completed   Macrovascular complications:   Denies: CAD, PVD, CVA   PAST HISTORY: Past Medical History:  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[place])    Clotting disorder ([place])    Complication of anesthesia    difficult waking   Diabetes mellitus without complication ([place])    Diet and exercise controlled   Diverticulosis    DVT (deep venous thrombosis) ([place]) 02/2019   3calf 2 right lung 1 left lung   DVT (deep venous thrombosis) ([place]) 2020   right leg   Dyspnea    GERD (gastroesophageal reflux disease)    Headache    Hepatic steatosis    Hiatal hernia    History of kidney stones    Hyperlipidemia    Hypertension    MVP (mitral valve prolapse)    history of   PONV (postoperative nausea and vomiting)    Pulmonary emboli ([place]) 03/01/2019   2 in the right; 1 in the left   Past Surgical History:  Past Surgical History:  Procedure Laterality Date   arm surgery  2012   left forearm fracture w/ plates pins screws   ARTERY  BIOPSY Right 12/03/2021   Procedure: BIOPSY TEMPORAL ARTERY;  Surgeon: Algernon Huxley, MD;  Location: [facility] ORS;  Service: Vascular;  Laterality: Right;  Right temple   BREAST BIOPSY Left    BREAST SURGERY  2000   breast biopsy (benign)   CATARACT EXTRACTION Right 2005   CATARACT EXTRACTION Left 2010   LITHOTRIPSY  08/12/2008   stent placed bilaterally   PANCREAS BIOPSY  2021   PANCREAS BIOPSY  2019   PARTIAL HYSTERECTOMY     Both ovaries remain, vaginal, for [place]    Social History:  reports that she quit smoking about 30 years ago. Her smoking use included cigarettes. She has a 13.00 pack-year smoking history. She  has never been exposed to tobacco smoke. She has never used smokeless tobacco. She reports that she does not drink alcohol and does not use drugs. Family History:  Family History  Problem Relation Age of Onset   Cancer Mother        bone   Hypertension Father    Mitral valve prolapse Father    Asthma Brother    Arthritis Brother    Nephrolithiasis Brother    Asthma Brother    COPD Brother    Breast cancer Maternal Aunt    Breast cancer Maternal Aunt    Colon cancer Neg Hx    Esophageal cancer Neg Hx    Inflammatory bowel disease Neg Hx    Liver disease Neg Hx    Pancreatic cancer Neg Hx    Rectal cancer Neg Hx    Stomach cancer Neg Hx      HOME MEDICATIONS: Allergies as of 05/13/2022       Reactions   Sudafed [pseudoephedrine Hcl] Hives, Itching, Anxiety   Atenolol    Codeine    REACTION: Migraine   Epinephrine Hives   Ibuprofen    Jardiance [empagliflozin]    Caused uti   Penicillins Itching   Has patient had a PCN reaction causing immediate rash, facial/tongue/throat swelling, SOB or lightheadedness with hypotension: No Has patient had a PCN reaction causing severe rash involving mucus membranes or skin necrosis: No Has patient had a PCN reaction that required hospitalization: No Has patient had a PCN reaction occurring within the last 10 years: No If all of the above answers are "NO", then may proceed with Cephalosporin use.   Pentazocine Lactate    REACTION: Swelling, itching, rash   Promethazine Other (See Comments)   migraine   Propranolol Other (See Comments)   Talwin [pentazocine] Other (See Comments)   Swelling and itching    Crestor [rosuvastatin] Other (See Comments)        Medication List        Accurate as of May 13, 2022 11:21 AM. If you have any questions, ask your nurse or doctor.          STOP taking these medications    Insulin Pen Needle 31G X 5 MM Misc Stopped by: Dorita Sciara, MD   omeprazole 20 MG capsule Commonly  known as: PRILOSEC Stopped by: Dorita Sciara, MD   repaglinide 0.5 MG tablet Commonly known as: PRANDIN Stopped by: Dorita Sciara, MD       TAKE these medications    atorvastatin 80 MG tablet Commonly known as: LIPITOR Take 1 tablet (80 mg total) by mouth at bedtime.   azaTHIOprine 50 MG tablet Commonly known as: IMURAN Take 1 tablet (50  mg total) by mouth daily.   budesonide 3 MG 24 hr capsule Commonly known as: ENTOCORT EC Take 2 capsules (6 mg total) by mouth daily.   colesevelam 625 MG tablet Commonly known as: WELCHOL TAKE 3 TABLETS BY MOUTH 2 TIMES DAILY WITH A MEAL.   cyanocobalamin 1000 MCG tablet Commonly known as: VITAMIN B12 Take 1 tablet by mouth daily.   Eliquis 2.5 MG Tabs tablet Generic drug: apixaban TAKE 1 TABLET BY MOUTH 2 TIMES DAILY.   ferrous gluconate 324 MG tablet Commonly known as: FERGON Take 1 tablet (324 mg total) by mouth daily with breakfast.   FreeStyle Libre 3 Sensor Misc 1 Device by Does not apply route every 14 (fourteen) days.   gabapentin 100 MG capsule Commonly known as: NEURONTIN TAKE 1 CAPSULE BY MOUTH UP TO 3 TIMES DAILY AS NEEDED.   gabapentin 300 MG capsule Commonly known as: NEURONTIN TAKE 2 CAPSULES BY MOUTH AT BEDTIME.   Lantus SoloStar 100 UNIT/ML Solostar Pen Generic drug: insulin glargine Inject 40 Units into the skin daily.   ondansetron 4 MG tablet Commonly known as: Zofran Take 1 tablet (4 mg total) by mouth every 8 (eight) hours as needed for nausea or vomiting.   OneTouch Delica Plus [id number] Misc USE TO CHECK BLOOD SUGAR TWO TIMES DAILY   OneTouch Verio Reflect w/Device Kit 2 (two) times daily. for testing   OneTouch Verio test strip Generic drug: glucose blood USE TO CHECK BLOOD SUGAR TWO TIMES A DAY   potassium chloride SA 20 MEQ tablet Commonly known as: KLOR-CON M TAKE 1 TABLET BY MOUTH DAILY.   Ubrelvy 100 MG Tabs Generic drug: Ubrogepant TAKE 1 TABLET BY MOUTH AS NEEDED  (MAY REPEAT DOSE AFTER 2 HOURS IF NEEDED. MAXIMUM 2 TABLETS IN 24 HOURS).   Vitamin D3 1.25 MG (50000 UT) Caps Take 1 capsule by mouth once a week.   Vyepti 100 MG/ML injection Generic drug: Eptinezumab-jjmr Inject 300 mg into the vein every 3 (three) months.         ALLERGIES: Allergies  Allergen Reactions   Sudafed [Pseudoephedrine Hcl] Hives, Itching and Anxiety   Atenolol    Codeine     REACTION: Migraine   Epinephrine Hives   Ibuprofen    Jardiance [Empagliflozin]     Caused uti   Penicillins Itching    Has patient had a PCN reaction causing immediate rash, facial/tongue/throat swelling, SOB or lightheadedness with hypotension: No Has patient had a PCN reaction causing severe rash involving mucus membranes or skin necrosis: No Has patient had a PCN reaction that required hospitalization: No Has patient had a PCN reaction occurring within the last 10 years: No If all of the above answers are "NO", then may proceed with Cephalosporin use.    Pentazocine Lactate     REACTION: Swelling, itching, rash   Promethazine Other (See Comments)    migraine   Propranolol Other (See Comments)   Talwin [Pentazocine] Other (See Comments)    Swelling and itching    Crestor [Rosuvastatin] Other (See Comments)     REVIEW OF SYSTEMS: A comprehensive ROS was conducted with the patient and is negative except as per HPI    OBJECTIVE:   VITAL SIGNS: BP 138/86 (BP Location: Left Arm, Patient Position: Sitting, Cuff Size: Large)   Pulse 90   Ht _0  (1.549 m)   Wt 147 lb (66.7 kg)   SpO2 98%   BMI 27.78 kg/m    PHYSICAL EXAM:  General: Pt appears  well and is in NAD  Neck: General: Supple without adenopathy or carotid bruits. Thyroid: Thyroid size normal.  No goiter or nodules appreciated.   Lungs: Clear with good BS bilat   Heart: RRR   Extremities:  Lower extremities - No pretibial edema.  Neuro: MS is good with appropriate affect, pt is alert and Ox3    DM foot exam:  07/10/2021  The skin of the feet is intact without sores or ulcerations. The pedal pulses are 2+ on right and 2+ on left. The sensation is intact to a screening 5.07, 10 gram monofilament bilaterally   DATA REVIEWED:  Lab Results  Component Value Date   HGBA1C 7.1 (A) 05/11/2022   HGBA1C 8.0 (A) 02/18/2022   HGBA1C 8.8 (H) 08/17/2021   Lab Results  Component Value Date   MICROALBUR <0.7 07/10/2021   LDLCALC 139 (H) 08/17/2021   CREATININE 0.94 05/12/2022   Lab Results  Component Value Date   MICRALBCREAT 0.8 07/10/2021    Lab Results  Component Value Date   CHOL 229 (H) 08/17/2021   HDL 59.90 08/17/2021   LDLCALC 139 (H) 08/17/2021   LDLDIRECT 161.0 10/22/2020   TRIG 151.0 (H) 08/17/2021   CHOLHDL 4 08/17/2021        Latest Reference Range & Units 07/10/21 10:10  Sodium 135 - 145 mEq/L 141  Potassium 3.5 - 5.1 mEq/L 4.0  Chloride 96 - 112 mEq/L 101  CO2 19 - 32 mEq/L 32  Glucose 70 - 99 mg/dL 214 (H)  BUN 6 - 23 mg/dL 16  Creatinine 0.40 - 1.20 mg/dL 1.13  Calcium 8.4 - 10.5 mg/dL 9.6  GFR >60.00 mL/min 49.41 (L)    Latest Reference Range & Units 07/10/21 10:10  MICROALB/CREAT RATIO 0.0 - 30.0 mg/g 0.8    Latest Reference Range & Units 07/10/21 10:10  C-Peptide 0.80 - 3.85 ng/mL 2.50    Latest Reference Range & Units 07/10/21 10:10  Creatinine,U mg/dL 87.7  Microalb, Ur 0.0 - 1.9 mg/dL <0.7  MICROALB/CREAT RATIO 0.0 - 30.0 mg/g 0.8   Thyroid ULtrasound 04/2020   Estimated total number of nodules >/= 1 cm: 1   Number of spongiform nodules >/=  2 cm not described below (TR1): 0   Number of mixed cystic and solid nodules >/= 1.5 cm not described below ([place]): 0   _________________________________________________________   Nodule # 4: The previously identified TI-RADS category 4 nodule in the right inferior gland is somewhat elongated and dumbbell-shaped. On today's study, slightly higher resolution demonstrates that this actually represents 2 smaller  subcentimeter nodules which are adjacent and abutting. Measured independently, neither nodule measures over 1 cm and therefore does not meet criteria for further imaging surveillance. Of note, the nodular conglomerate demonstrates no interval change in size or appearance compared to the prior imaging from October 2020.   Incidental note again made of multiple cystic and minimally complex cystic/solid nodules scattered throughout the thyroid. None of these meet criteria for further evaluation.   IMPRESSION: The previously identified TI-RADS category 4 nodule in the right inferior gland is identified on the present examination as 2 smaller adjacent and abutting nodules. While the overall conglomerate demonstrates no change compared to the prior study, the fact that this is 2 separate smaller nodules results in a down grade of the TI-RADS classification system. The nodules do not meet size criteria to recommend further evaluation. No further follow-up required.    Old records , labs and images have been reviewed.  ASSESSMENT / PLAN / RECOMMENDATIONS:   1) Type 2 Diabetes Mellitus, Sub-optimally  controlled, With CKD III complications - Most recent A1c of 7.1 %. Goal A1c < 7.0 %.     - A1c has trended down -She had had an episode of hypoglycemia yesterday this is confirmed through a fingerstick, in reviewing her glucose log the patient has been noted with previous recurrent hypoglycemic episodes, patient advised to contact the office with tight BG's anything less than 90 mg/DL so we can preemptively adjust her insulin dose -Praised the patient on weight loss, hence decrease insulin requirements -Patient is not able to follow a low-carb diet due to autoimmune pancreatitis, chronic GI symptoms, and migraine headaches which limits her dietary options.  Our goal is to prevent hypoglycemia and severe hyperglycemia - Pt with hx of pancreatitis hence GLP-1 agonist and DPP 4 inhibitors as  well as Tirzepatide are all contraindicated - Intolerant to Jardiance due to recurrent genital infections  -Will reduce Lantus as below  MEDICATIONS: Decrease  Lantus 30 units daily Continue Repaglinidie 0.5 mg, 2 tablets Before Lunch   EDUCATION / INSTRUCTIONS: BG monitoring instructions: Patient is instructed to check her blood sugars 1 times a day, fasting. Call [facility] if: BG persistently < 70  I reviewed the Rule of 15 for the treatment of hypoglycemia in detail with the patient. Literature supplied.   2) Diabetic complications:  Eye: Does not have known diabetic retinopathy.  Neuro/ Feet: Does not have known diabetic peripheral neuropathy. Renal: Patient does  have known baseline CKD. She is  on an ACEI/ARB at present.     Follow-up in 6 months      Signed electronically by: Mack Guise, MD  [facility]  [address] [place], [place] [address] Phone: [phone] FAX: [phone]   CC: [name], MD [place] [address] Phone: [phone]  Fax: [phone]    Return to Endocrinology clinic as below: Future Appointments  Date Time Provider [place]  05/18/2022  9:30 AM Halbert Jesson, Melanie Crazier, MD [facility] None  08/18/2022  8:00 AM [facility]  08/25/2022  8:20 AM [name], MD [facility]  08/31/2022  9:30 AM Pieter Partridge, DO [facility] None  09/01/2022  9:30 AM Zavier Canela, Melanie Crazier, MD [facility] None

## 2022-05-14 ENCOUNTER — Encounter: Payer: Self-pay | Admitting: Internal Medicine

## 2022-05-16 ENCOUNTER — Encounter: Payer: Self-pay | Admitting: Internal Medicine

## 2022-05-18 ENCOUNTER — Ambulatory Visit: Payer: Medicare Other | Admitting: Internal Medicine

## 2022-05-23 ENCOUNTER — Encounter: Payer: Self-pay | Admitting: Internal Medicine

## 2022-05-24 ENCOUNTER — Other Ambulatory Visit: Payer: Self-pay | Admitting: Internal Medicine

## 2022-05-24 MED ORDER — LANTUS SOLOSTAR 100 UNIT/ML ~~LOC~~ SOPN: 26.0000 [IU] | PEN_INJECTOR | Freq: Every day | SUBCUTANEOUS | 3 refills | Status: DC

## 2022-05-30 ENCOUNTER — Encounter: Payer: Self-pay | Admitting: Internal Medicine

## 2022-06-02 ENCOUNTER — Other Ambulatory Visit: Payer: Self-pay | Admitting: Internal Medicine

## 2022-06-02 ENCOUNTER — Encounter: Payer: Self-pay | Admitting: Family Medicine

## 2022-06-02 MED ORDER — LANTUS SOLOSTAR 100 UNIT/ML ~~LOC~~ SOPN: 24.0000 [IU] | PEN_INJECTOR | Freq: Every day | SUBCUTANEOUS | 3 refills | Status: DC

## 2022-06-07 ENCOUNTER — Encounter: Payer: Self-pay | Admitting: Internal Medicine

## 2022-06-08 ENCOUNTER — Telehealth: Payer: Self-pay

## 2022-06-08 ENCOUNTER — Encounter: Payer: Self-pay | Admitting: Family Medicine

## 2022-06-08 MED ORDER — DEXCOM G7 SENSOR MISC: 1.0000 | 3 refills | Status: DC

## 2022-06-08 NOTE — Telephone Encounter (Signed)
Order has been sent to [facility] DME supplier

## 2022-06-11 ENCOUNTER — Other Ambulatory Visit: Payer: Self-pay | Admitting: Family Medicine

## 2022-06-11 ENCOUNTER — Other Ambulatory Visit: Payer: Self-pay | Admitting: Neurology

## 2022-06-11 DIAGNOSIS — M545 Low back pain, unspecified: Secondary | ICD-10-CM

## 2022-06-14 ENCOUNTER — Encounter: Payer: Self-pay | Admitting: Internal Medicine

## 2022-06-15 ENCOUNTER — Encounter: Payer: Self-pay | Admitting: Gastroenterology

## 2022-06-21 ENCOUNTER — Encounter: Payer: Self-pay | Admitting: Internal Medicine

## 2022-06-26 ENCOUNTER — Encounter: Payer: Self-pay | Admitting: Gastroenterology

## 2022-06-29 ENCOUNTER — Encounter (HOSPITAL_COMMUNITY): Payer: Self-pay | Admitting: Orthopedic Surgery

## 2022-06-29 ENCOUNTER — Other Ambulatory Visit: Payer: Self-pay

## 2022-06-29 NOTE — Progress Notes (Addendum)
Debra Leach denies chest pain or shortness of breath.  Patient denies having any s/s of Covid in her household, also denies any known exposure to Covid.   Debra Leach's PCP is Dr. Eliezer Lofts, Endocrinologist is Dr. Bosie Helper. [place], Neurologist is Dr Metta Clines.  Debra. Leach has had severe Migraine Headaches for over 20 years. Patient reports that if she does not eat every 3 - 4 hours, she will have a huge headache and N/V.   Debra Leach reports that she has had a migraine for 5 days, an if she is not able to eat, she will not be able to have surgery.  I called Dr. Madelynn Done office and left a voice message on Debra Leach 's voice message. I attempted to send Dr. Tempie Donning a instant message, he is not available.  I spoke with Dr. Fransisco Beau and explained what patient had said about eating.  Dr. Fransisco Beau said that patients  have to stop eating 6 houirs prior to surgery.  I called Debra. Leach and informed her that she could have crackers or toast before 0630.  Patient said that she will try it.  Debra Leach has type II diabetes, I instructed patient that if CBG is greater than 70 to to take 1/2 dose of Lantus- 11 units. I instructed patient to check CBG after awaking and every 2 hours until arrival  to the hospital.  I Instructed Debra. Leach if CBG is less than 70 to take 4 Glucose Tablets or 1 tube of Glucose Gel or 1/2 cup of a clear juice. Recheck CBG in 15 minutes if CBG is not over 70 call, pre- op desk at [phone] for further instructions. If scheduled to receive Insulin, do not take Insulin

## 2022-06-30 ENCOUNTER — Encounter (HOSPITAL_COMMUNITY)
Admission: RE | Disposition: A | Discharge status: Home / Self Care | Payer: Self-pay | Source: Home / Self Care | Attending: Orthopedic Surgery

## 2022-06-30 ENCOUNTER — Ambulatory Visit (HOSPITAL_BASED_OUTPATIENT_CLINIC_OR_DEPARTMENT_OTHER): Payer: Medicare Other | Admitting: Certified Registered"

## 2022-06-30 ENCOUNTER — Encounter (HOSPITAL_COMMUNITY): Payer: Self-pay | Admitting: Orthopedic Surgery

## 2022-06-30 ENCOUNTER — Ambulatory Visit (HOSPITAL_COMMUNITY)
Admission: RE | Admit: 2022-06-30 | Discharge: 2022-06-30 | Disposition: A | Discharge status: Home / Self Care | Payer: Medicare Other | Attending: Orthopedic Surgery | Admitting: Orthopedic Surgery

## 2022-06-30 ENCOUNTER — Other Ambulatory Visit: Payer: Self-pay

## 2022-06-30 ENCOUNTER — Ambulatory Visit (HOSPITAL_COMMUNITY): Payer: Medicare Other | Admitting: Certified Registered"

## 2022-06-30 DIAGNOSIS — K449 Diaphragmatic hernia without obstruction or gangrene: Secondary | ICD-10-CM | POA: Insufficient documentation

## 2022-06-30 DIAGNOSIS — S66304A Unspecified injury of extensor muscle, fascia and tendon of right ring finger at wrist and hand level, initial encounter: Secondary | ICD-10-CM | POA: Insufficient documentation

## 2022-06-30 DIAGNOSIS — S66302A Unspecified injury of extensor muscle, fascia and tendon of right middle finger at wrist and hand level, initial encounter: Secondary | ICD-10-CM | POA: Insufficient documentation

## 2022-06-30 DIAGNOSIS — E119 Type 2 diabetes mellitus without complications: Secondary | ICD-10-CM | POA: Insufficient documentation

## 2022-06-30 DIAGNOSIS — Z87891 Personal history of nicotine dependence: Secondary | ICD-10-CM | POA: Insufficient documentation

## 2022-06-30 DIAGNOSIS — X509XXA Other and unspecified overexertion or strenuous movements or postures, initial encounter: Secondary | ICD-10-CM | POA: Diagnosis not present

## 2022-06-30 DIAGNOSIS — Z86718 Personal history of other venous thrombosis and embolism: Secondary | ICD-10-CM | POA: Diagnosis not present

## 2022-06-30 DIAGNOSIS — Z794 Long term (current) use of insulin: Secondary | ICD-10-CM | POA: Diagnosis not present

## 2022-06-30 DIAGNOSIS — S66392A Other injury of extensor muscle, fascia and tendon of right middle finger at wrist and hand level, initial encounter: Secondary | ICD-10-CM

## 2022-06-30 DIAGNOSIS — I10 Essential (primary) hypertension: Secondary | ICD-10-CM | POA: Diagnosis not present

## 2022-06-30 DIAGNOSIS — S66394A Other injury of extensor muscle, fascia and tendon of right ring finger at wrist and hand level, initial encounter: Secondary | ICD-10-CM | POA: Diagnosis not present

## 2022-06-30 DIAGNOSIS — K219 Gastro-esophageal reflux disease without esophagitis: Secondary | ICD-10-CM | POA: Diagnosis not present

## 2022-06-30 HISTORY — PX: REPAIR EXTENSOR TENDON: SHX5382

## 2022-06-30 LAB — COMPREHENSIVE METABOLIC PANEL
ALT: 19 U/L (ref 0–44)
AST: 24 U/L (ref 15–41)
Albumin: 3.3 g/dL — ABNORMAL LOW (ref 3.5–5.0)
Alkaline Phosphatase: 71 U/L (ref 38–126)
Anion gap: 9 (ref 5–15)
BUN: 14 mg/dL (ref 8–23)
CO2: 24 mmol/L (ref 22–32)
Calcium: 9.1 mg/dL (ref 8.9–10.3)
Chloride: 107 mmol/L (ref 98–111)
Creatinine, Ser: 0.85 mg/dL (ref 0.44–1.00)
GFR, Estimated: >60 mL/min (ref >60)
Glucose, Bld: 110 mg/dL — ABNORMAL HIGH (ref 70–99)
Potassium: 3.7 mmol/L (ref 3.5–5.1)
Sodium: 140 mmol/L (ref 135–145)
Total Bilirubin: 0.3 mg/dL (ref 0.3–1.2)
Total Protein: 7 g/dL (ref 6.5–8.1)

## 2022-06-30 LAB — CBC
HCT: 43.8 % (ref 36.0–46.0)
Hemoglobin: 13.8 g/dL (ref 12.0–15.0)
MCH: 27 pg (ref 26.0–34.0)
MCHC: 31.5 g/dL (ref 30.0–36.0)
MCV: 85.5 fL (ref 80.0–100.0)
Platelets: 324 10*3/uL (ref 150–400)
RBC: 5.12 MIL/uL — ABNORMAL HIGH (ref 3.87–5.11)
RDW: 17.3 % — ABNORMAL HIGH (ref 11.5–15.5)
WBC: 12.6 10*3/uL — ABNORMAL HIGH (ref 4.0–10.5)
nRBC: 0 % (ref 0.0–0.2)

## 2022-06-30 LAB — GLUCOSE, CAPILLARY
Glucose-Capillary: 117 mg/dL — ABNORMAL HIGH (ref 70–99)
Glucose-Capillary: 126 mg/dL — ABNORMAL HIGH (ref 70–99)
Glucose-Capillary: 133 mg/dL — ABNORMAL HIGH (ref 70–99)

## 2022-06-30 SURGERY — REPAIR, TENDON, EXTENSOR
Anesthesia: General | Site: Hand | Laterality: Right

## 2022-06-30 MED ORDER — CHLORHEXIDINE GLUCONATE 0.12 % MT SOLN
15.0000 mL | Freq: Once | OROMUCOSAL | Status: AC
  Administered 2022-06-30: 15 mL via OROMUCOSAL
  Filled 2022-06-30: qty 15

## 2022-06-30 MED ORDER — FENTANYL CITRATE (PF) 250 MCG/5ML IJ SOLN
INTRAMUSCULAR | Status: DC | PRN
  Administered 2022-06-30: 25 ug via INTRAVENOUS

## 2022-06-30 MED ORDER — MIDAZOLAM HCL 2 MG/2ML IJ SOLN
INTRAMUSCULAR | Status: AC
  Filled 2022-06-30: qty 2

## 2022-06-30 MED ORDER — BACITRACIN ZINC 500 UNIT/GM EX OINT
TOPICAL_OINTMENT | CUTANEOUS | Status: DC | PRN
  Administered 2022-06-30: 1 via TOPICAL

## 2022-06-30 MED ORDER — LACTATED RINGERS IV SOLN: INTRAVENOUS | Status: DC

## 2022-06-30 MED ORDER — 0.9 % SODIUM CHLORIDE (POUR BTL) OPTIME
TOPICAL | Status: DC | PRN
  Administered 2022-06-30: 1000 mL

## 2022-06-30 MED ORDER — LIDOCAINE 2% (20 MG/ML) 5 ML SYRINGE
INTRAMUSCULAR | Status: DC | PRN
  Administered 2022-06-30: 60 mg via INTRAVENOUS

## 2022-06-30 MED ORDER — LIDOCAINE HCL 1 % IJ SOLN
INTRAMUSCULAR | Status: DC | PRN
  Administered 2022-06-30: 10 mL

## 2022-06-30 MED ORDER — DEXAMETHASONE SODIUM PHOSPHATE 10 MG/ML IJ SOLN
INTRAMUSCULAR | Status: DC | PRN
  Administered 2022-06-30: 5 mg via INTRAVENOUS

## 2022-06-30 MED ORDER — PROPOFOL 10 MG/ML IV BOLUS
INTRAVENOUS | Status: AC
  Filled 2022-06-30: qty 20

## 2022-06-30 MED ORDER — FENTANYL CITRATE (PF) 250 MCG/5ML IJ SOLN
INTRAMUSCULAR | Status: AC
  Filled 2022-06-30: qty 5

## 2022-06-30 MED ORDER — PROPOFOL 500 MG/50ML IV EMUL
INTRAVENOUS | Status: DC | PRN
  Administered 2022-06-30: 125 ug/kg/min via INTRAVENOUS

## 2022-06-30 MED ORDER — ACETAMINOPHEN 500 MG PO TABS
1000.0000 mg | ORAL_TABLET | Freq: Once | ORAL | Status: AC
  Administered 2022-06-30: 1000 mg via ORAL
  Filled 2022-06-30: qty 2

## 2022-06-30 MED ORDER — PROPOFOL 10 MG/ML IV BOLUS
INTRAVENOUS | Status: DC | PRN
  Administered 2022-06-30: 30 mg via INTRAVENOUS
  Administered 2022-06-30: 130 mg via INTRAVENOUS

## 2022-06-30 MED ORDER — ORAL CARE MOUTH RINSE: 15.0000 mL | Freq: Once | OROMUCOSAL | Status: AC

## 2022-06-30 MED ORDER — INSULIN ASPART 100 UNIT/ML IJ SOLN: 0.0000 [IU] | INTRAMUSCULAR | Status: DC | PRN

## 2022-06-30 MED ORDER — ONDANSETRON HCL 4 MG/2ML IJ SOLN
INTRAMUSCULAR | Status: DC | PRN
  Administered 2022-06-30: 4 mg via INTRAVENOUS

## 2022-06-30 MED ORDER — CEFAZOLIN SODIUM-DEXTROSE 2-4 GM/100ML-% IV SOLN
2.0000 g | INTRAVENOUS | Status: AC
  Administered 2022-06-30: 2 g via INTRAVENOUS
  Filled 2022-06-30: qty 100

## 2022-06-30 MED ORDER — BACITRACIN ZINC 500 UNIT/GM EX OINT
TOPICAL_OINTMENT | CUTANEOUS | Status: AC
  Filled 2022-06-30: qty 28.35

## 2022-06-30 MED ORDER — MIDAZOLAM HCL 2 MG/2ML IJ SOLN
INTRAMUSCULAR | Status: DC | PRN
  Administered 2022-06-30: 2 mg via INTRAVENOUS

## 2022-06-30 MED ORDER — LIDOCAINE 2% (20 MG/ML) 5 ML SYRINGE
INTRAMUSCULAR | Status: AC
  Filled 2022-06-30: qty 5

## 2022-06-30 MED ORDER — PHENYLEPHRINE 80 MCG/ML (10ML) SYRINGE FOR IV PUSH (FOR BLOOD PRESSURE SUPPORT)
PREFILLED_SYRINGE | INTRAVENOUS | Status: DC | PRN
  Administered 2022-06-30: 160 ug via INTRAVENOUS
  Administered 2022-06-30: 80 ug via INTRAVENOUS
  Administered 2022-06-30: 160 ug via INTRAVENOUS

## 2022-06-30 SURGICAL SUPPLY — 49 items
APL PRP STRL LF DISP 70% ISPRP (MISCELLANEOUS) ×1
BLADE SURG 15 STRL LF DISP TIS (BLADE) ×2 IMPLANT
BLADE SURG 15 STRL SS (BLADE) ×2
BNDG CMPR 9X4 STRL LF SNTH (GAUZE/BANDAGES/DRESSINGS) ×1
BNDG ELASTIC 2X5.8 VLCR STR LF (GAUZE/BANDAGES/DRESSINGS) IMPLANT
BNDG ELASTIC 3X5.8 VLCR STR LF (GAUZE/BANDAGES/DRESSINGS) ×1 IMPLANT
BNDG ELASTIC 4X5.8 VLCR STR LF (GAUZE/BANDAGES/DRESSINGS) IMPLANT
BNDG ESMARK 4X9 LF (GAUZE/BANDAGES/DRESSINGS) ×1 IMPLANT
BNDG GAUZE DERMACEA FLUFF 4 (GAUZE/BANDAGES/DRESSINGS) ×1 IMPLANT
BNDG GZE DERMACEA 4 6PLY (GAUZE/BANDAGES/DRESSINGS) ×1
CHLORAPREP W/TINT 26 (MISCELLANEOUS) ×1 IMPLANT
CORD BIPOLAR FORCEPS 12FT (ELECTRODE) ×1 IMPLANT
COVER MAYO STAND STRL (DRAPES) ×1 IMPLANT
CUFF TOURN SGL QUICK 18X4 (TOURNIQUET CUFF) ×1 IMPLANT
DRAPE EXTREMITY T 121X128X90 (DISPOSABLE) ×1 IMPLANT
DRAPE SURG 17X23 STRL (DRAPES) ×1 IMPLANT
DRSG XEROFORM 1X8 (GAUZE/BANDAGES/DRESSINGS) IMPLANT
GAUZE 4X4 16PLY ~~LOC~~+RFID DBL (SPONGE) IMPLANT
GAUZE PAD ABD 8X10 STRL (GAUZE/BANDAGES/DRESSINGS) IMPLANT
GAUZE SPONGE 4X4 12PLY STRL (GAUZE/BANDAGES/DRESSINGS) ×1 IMPLANT
GAUZE XEROFORM 1X8 LF (GAUZE/BANDAGES/DRESSINGS) ×1 IMPLANT
GLOVE BIO SURGEON STRL SZ7 (GLOVE) ×1 IMPLANT
GLOVE BIOGEL PI IND STRL 7.0 (GLOVE) ×1 IMPLANT
GOWN STRL REUS W/ TWL LRG LVL3 (GOWN DISPOSABLE) ×2 IMPLANT
GOWN STRL REUS W/ TWL XL LVL3 (GOWN DISPOSABLE) ×2 IMPLANT
GOWN STRL REUS W/TWL LRG LVL3 (GOWN DISPOSABLE) ×1
GOWN STRL REUS W/TWL XL LVL3 (GOWN DISPOSABLE) ×1
KIT BASIN OR (CUSTOM PROCEDURE TRAY) ×1 IMPLANT
LOOP VESSEL MAXI BLUE (MISCELLANEOUS) IMPLANT
NDL HYPO 25X1 1.5 SAFETY (NEEDLE) IMPLANT
NDL KEITH (NEEDLE) IMPLANT
NEEDLE HYPO 25X1 1.5 SAFETY (NEEDLE) IMPLANT
NEEDLE KEITH (NEEDLE) IMPLANT
NS IRRIG 1000ML POUR BTL (IV SOLUTION) ×1 IMPLANT
PAD CAST 3X4 CTTN HI CHSV (CAST SUPPLIES) ×1 IMPLANT
PAD CAST 4YDX4 CTTN HI CHSV (CAST SUPPLIES) IMPLANT
PADDING CAST ABS COTTON 3X4 (CAST SUPPLIES) IMPLANT
PADDING CAST ABS COTTON 4X4 ST (CAST SUPPLIES) ×1 IMPLANT
PADDING CAST COTTON 3X4 STRL (CAST SUPPLIES) ×1
PADDING CAST COTTON 4X4 STRL (CAST SUPPLIES) ×1
SLEEVE SCD COMPRESS KNEE MED (STOCKING) IMPLANT
SPIKE FLUID TRANSFER (MISCELLANEOUS) IMPLANT
SPLINT FIBERGLASS 4X30 (CAST SUPPLIES) IMPLANT
SUT ETHIBOND 3 0 SH 1 (SUTURE) IMPLANT
SUT ETHILON 4 0 PS 2 18 (SUTURE) IMPLANT
SUT MERSILENE 4 0 P 3 (SUTURE) IMPLANT
SYR CONTROL 10ML LL (SYRINGE) IMPLANT
TOWEL GREEN STERILE FF (TOWEL DISPOSABLE) ×2 IMPLANT
UNDERPAD 30X36 HEAVY ABSORB (UNDERPADS AND DIAPERS) ×1 IMPLANT

## 2022-06-30 NOTE — Anesthesia Procedure Notes (Signed)
Procedure Name: LMA Insertion Date/Time: 06/30/2022 1:16 PM  Performed by: Elvin So, CRNAPre-anesthesia Checklist: Patient identified, Emergency Drugs available, Suction available and Patient being monitored Patient Re-evaluated:Patient Re-evaluated prior to induction Oxygen Delivery Method: Circle System Utilized Preoxygenation: Pre-oxygenation with 100% oxygen Induction Type: IV induction Ventilation: Mask ventilation without difficulty LMA: LMA inserted LMA Size: 3.0 Number of attempts: 1 Airway Equipment and Method: Bite block Placement Confirmation: positive ETCO2 Tube secured with: Tape Dental Injury: Teeth and Oropharynx as per pre-operative assessment

## 2022-06-30 NOTE — Discharge Instructions (Signed)
Audria Nine, M.D. Hand Surgery  POST-OPERATIVE DISCHARGE INSTRUCTIONS   PRESCRIPTIONS: You may have been given a prescription to be taken as directed for post-operative pain control.  You may also take over the counter ibuprofen/aleve and tylenol for pain. Take this as directed on the packaging. Do not exceed 3000 mg tylenol/acetaminophen in 24 hours.  Ibuprofen 600-800 mg (3-4) tablets by mouth every 6 hours as needed for pain.  OR Aleve 2 tablets by mouth every 12 hours (twice daily) as needed for pain.  AND/OR Tylenol 1000 mg (2 tablets) every 8 hours as needed for pain.  Please use your pain medication carefully, as refills are limited and you may not be provided with one.  As stated above, please use over the counter pain medicine - it will also be helpful with decreasing your swelling.    ANESTHESIA: After your surgery, post-surgical discomfort or pain is likely. This discomfort can last several days to a few weeks. At certain times of the day your discomfort may be more intense.   Did you receive a nerve block?  A nerve block can provide pain relief for one hour to two days after your surgery. As long as the nerve block is working, you will experience little or no sensation in the area the surgeon operated on.  As the nerve block wears off, you will begin to experience pain or discomfort. It is very important that you begin taking your prescribed pain medication before the nerve block fully wears off. Treating your pain at the first sign of the block wearing off will ensure your pain is better controlled and more tolerable when full-sensation returns. Do not wait until the pain is intolerable, as the medicine will be less effective. It is better to treat pain in advance than to try and catch up.   General Anesthesia:  If you did not receive a nerve block during your surgery, you will need to start taking your pain medication shortly after your surgery and should continue  to do so as prescribed by your surgeon.     ICE AND ELEVATION: You may use ice for the first 48-72 hours, but it is not critical.   Motion of your fingers is very important to decrease the swelling.  Elevation, as much as possible for the next 48 hours, is critical for decreasing swelling as well as for pain relief. Elevation means when you are seated or lying down, you hand should be at or above your heart. When walking, the hand needs to be at or above the level of your elbow.  If the bandage gets too tight, it may need to be loosened. Please contact our office and we will instruct you in how to do this.    SURGICAL BANDAGES:  Keep your dressing and/or splint clean and dry at all times.  Do not remove until you are seen again in the office.  If careful, you may place a plastic bag over your bandage and tape the end to shower, but be careful, do not get your bandages wet.     HAND THERAPY:  You will be contacted to set up the date and time of your first hand therapy visit.    ACTIVITY AND WORK: You are encouraged to move any fingers which are not in the bandage.  Light use of the fingers is allowed to assist the other hand with daily hygiene and eating, but strong gripping or lifting is often uncomfortable and should be avoided.  [facility] [address] Floor, [address] [place], [place] [address] [phone]

## 2022-06-30 NOTE — Interval H&P Note (Signed)
History and Physical Interval Note:  06/30/2022 12:44 PM  Debra Leach  has presented today for surgery, with the diagnosis of Right middle finger sagittal band rupture.  The various methods of treatment have been discussed with the patient and family. After consideration of risks, benefits and other options for treatment, the patient has consented to  Procedure(s): Middle finger sagittal band reconstruction (Right) as a surgical intervention.  The patient's history has been reviewed, patient examined, no change in status, stable for surgery.  I have reviewed the patient's chart and labs.  Questions were answered to the patient's satisfaction.      Audric Venn

## 2022-06-30 NOTE — H&P (Signed)
HAND SURGERY   HPI: Patient is a 72 y.o. 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  This began in August when patient was forcefully flicking hair gel from her middle finger.  She felt immediate pain around the MCPJ with inability to actively extend the middle finger from a flexed position.  An attempt was made at nonoperative management with hand therapy and the use of a yoke/relative motion splint.  She has failed conservative management and has developed similar issue with right ring finger.  She is able to make a complete fist but has difficulty with active extension with palpable clunk and relocation of the sagittal band from the intermetacarpal valley  She presents today for radial sagittal band repair versus reconstruction.  Patient denies any changes to their medical history or new systemic symptoms today.    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HCC)    Clotting disorder (HCC)    Complication of anesthesia    difficult waking   Diabetes mellitus without complication (HCC)    Diet and exercise controlled   Diverticulosis    DVT (deep venous thrombosis) ([place]) 02/2019   3calf 2 right lung 1 left lung   DVT (deep venous thrombosis) ([place]) 2020   right leg   Dyspnea    GERD (gastroesophageal reflux disease)    Headache    Hepatic steatosis    Hiatal hernia    History of kidney stones    Hyperlipidemia    Hypertension    06/29/22 has not had to take blood pressure meds in over 1 year.   MVP (mitral valve prolapse)    history of   PONV (postoperative nausea and vomiting)    Pulmonary emboli ([place]) 03/01/2019   2 in the right; 1 in the left   Past Surgical History:  Procedure Laterality Date   arm surgery  2012   left forearm fracture w/ plates pins screws   ARTERY BIOPSY Right 12/03/2021   Procedure: BIOPSY  TEMPORAL ARTERY;  Surgeon: Algernon Huxley, MD;  Location: [facility] ORS;  Service: Vascular;  Laterality: Right;  Right temple   BREAST BIOPSY Left    BREAST SURGERY  2000   breast biopsy (benign)   CATARACT EXTRACTION Right 2005   CATARACT EXTRACTION Left 2010   LITHOTRIPSY  08/12/2008   stent placed bilaterally   PANCREAS BIOPSY  2021   PANCREAS BIOPSY  2019   PARTIAL HYSTERECTOMY     Both ovaries remain, vaginal, for mennorhagia   TONSILLECTOMY     TUBAL LIGATION  1980   Social History   Socioeconomic History   Marital status: Married    Spouse name: Carloyn Manner   Number of children: 0   Years of education: Not on file   Highest education level: Not on file  Occupational History   Occupation: retired Energy manager: retired  Tobacco Use   Smoking status: Former    Packs/day: 1.00    Years: 13.00    Total pack years: 13.00    Types: Cigarettes    Quit date: 06/08/1991    Years since quitting: 31.0    Passive exposure: Never   Smokeless tobacco: Never  Vaping Use   Vaping Use: Never used  Substance and Sexual Activity   Alcohol use: No   Drug use: No  Sexual activity: Not on file  Other Topics Concern   Not on file  Social History Narrative   Regular exercise--yes, recumbent bike 3-4 days a week      Diet: fruits and veggies, water, eats at home, drinks a lot of milk      Patient is right-handed. She drinks 1-2 cups of coffee a day.      One story home   Social Determinants of Health   Financial Resource Strain: Low Risk  (11/20/2019)   Overall Financial Resource Strain (CARDIA)    Difficulty of Paying Living Expenses: Not hard at all  Food Insecurity: No Food Insecurity (04/06/2019)   Hunger Vital Sign    Worried About Running Out of Food in the Last Year: Never true    Ran Out of Food in the Last Year: Never true  Transportation Needs: No Transportation Needs (04/06/2019)   PRAPARE - Hydrologist (Medical): No    Lack of  Transportation (Non-Medical): No  Physical Activity: Inactive (04/06/2019)   Exercise Vital Sign    Days of Exercise per Week: 0 days    Minutes of Exercise per Session: 0 min  Stress: No Stress Concern Present (04/06/2019)   [place]    Feeling of Stress : Not at all  Social Connections: Not on file   Family History  Problem Relation Age of Onset   Cancer Mother        bone   Hypertension Father    Mitral valve prolapse Father    Asthma Brother    Arthritis Brother    Nephrolithiasis Brother    Asthma Brother    COPD Brother    Breast cancer Maternal Aunt    Breast cancer Maternal Aunt    Colon cancer Neg Hx    Esophageal cancer Neg Hx    Inflammatory bowel disease Neg Hx    Liver disease Neg Hx    Pancreatic cancer Neg Hx    Rectal cancer Neg Hx    Stomach cancer Neg Hx    - negative except otherwise stated in the family history section Allergies  Allergen Reactions   Sudafed [Pseudoephedrine Hcl] Hives, Itching and Anxiety   Atenolol Other (See Comments)    Migraine   Codeine Itching and Swelling    REACTION: Migraine   Epinephrine Hives   Hydrocodone Other (See Comments)    Migraine   Ibuprofen Other (See Comments)    Migraine   Jardiance [Empagliflozin] Other (See Comments)    Caused uti Yeast infection   Oxycodone Other (See Comments)    Migraine   Penicillins Itching and Swelling    Has patient had a PCN reaction causing immediate rash, facial/tongue/throat swelling, SOB or lightheadedness with hypotension: No Has patient had a PCN reaction causing severe rash involving mucus membranes or skin necrosis: No Has patient had a PCN reaction that required hospitalization: No Has patient had a PCN reaction occurring within the last 10 years: No If all of the above answers are "NO", then may proceed with Cephalosporin use.    Pentazocine Lactate     REACTION: Swelling, itching, rash    Promethazine Other (See Comments)    migraine   Propranolol Other (See Comments)    Migraine   Talwin [Pentazocine] Other (See Comments)    Swelling and itching    Tramadol Other (See Comments)    Migraine   Crestor [Rosuvastatin] Other (See Comments)  Muscle pain   Prior to Admission medications   Medication Sig Start Date End Date Taking? Authorizing Provider  alum & mag hydroxide-simeth (MAALOX/MYLANTA) 200-200-20 MG/5ML suspension Take 30 mLs by mouth every 6 (six) hours as needed for indigestion or heartburn.   Yes [provider]  atorvastatin (LIPITOR) 80 MG tablet Take 1 tablet (80 mg total) by mouth at bedtime. 11/03/21  Yes Jennye Boroughs, MD  budesonide (ENTOCORT EC) 3 MG 24 hr capsule Take 2 capsules (6 mg total) by mouth daily. 02/25/22  Yes Mansouraty, Telford Nab., MD  Cholecalciferol (VITAMIN D3) 1.25 MG (50000 UT) CAPS Take 1 capsule by mouth once a week. 08/20/21  Yes Bedsole, Amy E, MD  colesevelam (WELCHOL) 625 MG tablet TAKE 3 TABLETS BY MOUTH 2 TIMES DAILY WITH A MEAL. 08/31/21  Yes Bedsole, Amy E, MD  ELIQUIS 2.5 MG TABS tablet TAKE 1 TABLET BY MOUTH 2 TIMES DAILY. 06/13/22  Yes Bedsole, Amy E, MD  ferrous gluconate (FERGON) 324 MG tablet Take 1 tablet (324 mg total) by mouth daily with breakfast. 02/15/22  Yes Mansouraty, Telford Nab., MD  gabapentin (NEURONTIN) 100 MG capsule TAKE 1 CAPSULE BY MOUTH UP TO 3 TIMES DAILY AS NEEDED. 10/07/21  Yes Jaffe, Adam R, DO  gabapentin (NEURONTIN) 300 MG capsule TAKE 2 CAPSULES BY MOUTH AT BEDTIME. 06/14/22  Yes Jaffe, Adam R, DO  insulin glargine (LANTUS SOLOSTAR) 100 UNIT/ML Solostar Pen Inject 24 Units into the skin daily. Patient taking differently: Inject 22 Units into the skin every morning. 06/02/22  Yes Shamleffer, Melanie Crazier, MD  omeprazole (PRILOSEC) 20 MG capsule Take 20 mg by mouth daily with supper. 06/11/22  Yes [provider]  ondansetron (ZOFRAN) 4 MG tablet Take 1 tablet (4 mg total) by mouth every 8  (eight) hours as needed for nausea or vomiting. 02/12/22  Yes Mansouraty, Telford Nab., MD  potassium chloride SA (KLOR-CON M) 20 MEQ tablet TAKE 1 TABLET BY MOUTH DAILY. Patient taking differently: Take 20 mEq by mouth daily with supper. 03/15/22  Yes Bedsole, Amy E, MD  repaglinide (PRANDIN) 0.5 MG tablet Take 2 tablets (1 mg total) by mouth daily before lunch. before breakfast and lunch Patient taking differently: Take 1 mg by mouth daily before lunch. 05/13/22  Yes Shamleffer, Melanie Crazier, MD  UBRELVY 100 MG TABS TAKE 1 TABLET BY MOUTH AS NEEDED (MAY REPEAT DOSE AFTER 2 HOURS IF NEEDED. MAXIMUM 2 TABLETS IN 24 HOURS). 03/27/20  Yes Tomi Likens, Adam R, DO  vitamin B-12 (CYANOCOBALAMIN) 1000 MCG tablet Take 1,000 mcg by mouth daily with breakfast. 08/15/20  Yes [provider]  azaTHIOprine (IMURAN) 50 MG tablet Take 1 tablet (50 mg total) by mouth daily. 05/04/22   Mansouraty, Telford Nab., MD  Blood Glucose Monitoring Suppl (ONETOUCH VERIO REFLECT) w/Device KIT 2 (two) times daily. for testing 10/25/19   [provider]  Continuous Blood Gluc Sensor (DEXCOM G7 SENSOR) MISC 1 Device by Does not apply route as directed. 06/08/22   Shamleffer, Melanie Crazier, MD  Eptinezumab-jjmr (VYEPTI) 100 MG/ML injection Inject 300 mg into the vein every 3 (three) months.    [provider]  glucose blood (ONETOUCH VERIO) test strip USE TO CHECK BLOOD SUGAR TWO TIMES A DAY 05/08/21   Bedsole, Amy E, MD  Lancets (ONETOUCH DELICA PLUS [id number]) MISC USE TO CHECK BLOOD SUGAR TWO TIMES DAILY 05/08/21   Bedsole, Amy E, MD   No results found. - Positive ROS: All other systems have been reviewed and were otherwise  negative with the exception of those mentioned in the HPI and as above.  Physical Exam: General: No acute distress, resting comfortably Cardiovascular: BUE warm and well perfused, normal rate Respiratory: Normal WOB on RA Skin: Warm and dry Neurologic: Sensation intact  distally Psychiatric: Patient is at baseline mood and affect  Right Upper Extremity  No TTP around radial sagittal band or MCP joint of middle and ring fingers Obvious ulnar subluxation of extensor tendon of ring and middle fingers over metacarpal head with finger flexion.  She is occasionally unable to active extend the ring and middle fingers from this flexed position.  There is obvious relocation of the extensor tendon with painful, visible, and palpable clunk.  No involvement of index or small fingers.  SILT m/u/r distribution.  Hand warm and well perfused w/ BCR.   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  She has failed conservative management with hand therapy and immobilization with use of a yoke/relative motion splint.  This is becoming increasingly bothersome for her and is interfering with her quality of life.  She has been seen by both cardiology and GI and has obtained surgical clearance from both services.  She has held her eliquis as directed by her cardiologist.   Plan: OR today for right ring and middle finger sagittal band repair verus reconstruction. We again reviewed the risks of surgery which include bleeding, infection, damage to neurovascular structures, persistent symptoms, failure of the repair/reconstruction, need for additional surgery.  Informed consent was signed.  All questions were answered.   Sherilyn Cooter, M.D. [facility] 12:37 PM

## 2022-06-30 NOTE — Transfer of Care (Signed)
Immediate Anesthesia Transfer of Care Note  Patient: Debra Leach  Procedure(s) Performed: Middle and ring finger sagittal band reconstruction (Right: Hand)  Patient Location: PACU  Anesthesia Type:General  Level of Consciousness: awake and patient cooperative  Airway & Oxygen Therapy: Patient Spontanous Breathing and Patient connected to face mask oxygen  Post-op Assessment: Report given to RN, Post -op Vital signs reviewed and stable, and Patient moving all extremities  Post vital signs: Reviewed and stable  Last Vitals:  Vitals Value Taken Time  BP 109/59 06/30/22 1508  Temp    Pulse 70 06/30/22 1509  Resp 13 06/30/22 1509  SpO2 94 % 06/30/22 1509  Vitals shown include unvalidated device data.  Last Pain:  Vitals:   06/30/22 1054  TempSrc:   PainSc: 3       Patients Stated Pain Goal: 0 (86/77/37 3668)  Complications: No notable events documented.

## 2022-06-30 NOTE — Progress Notes (Signed)
Orthopedic Tech Progress Note Patient Details:  Debra Leach [birth year] [id number]  PACU RN called requesting an ARM SLING   Ortho Devices Type of Ortho Device: Arm sling Ortho Device/Splint Location: RUE Ortho Device/Splint Interventions: Ordered   Post Interventions Patient Tolerated: Well Instructions Provided: Care of device  Janit Pagan 06/30/2022, 4:23 PM

## 2022-06-30 NOTE — Anesthesia Postprocedure Evaluation (Signed)
Anesthesia Post Note  Patient: Debra Leach  Procedure(s) Performed: Middle and ring finger sagittal band reconstruction (Right: Hand)     Patient location during evaluation: PACU Anesthesia Type: General Level of consciousness: awake and alert Pain management: pain level controlled Vital Signs Assessment: post-procedure vital signs reviewed and stable Respiratory status: spontaneous breathing, nonlabored ventilation and respiratory function stable Cardiovascular status: blood pressure returned to baseline and stable Postop Assessment: no apparent nausea or vomiting Anesthetic complications: no  No notable events documented.  Last Vitals:  Vitals:   06/30/22 1545 06/30/22 1600  BP: (!) 141/68 (!) 153/75  Pulse: 68 66  Resp: 20 (!) 22  Temp:  36.9 C  SpO2: 93% 97%    Last Pain:  Vitals:   06/30/22 1508  TempSrc:   PainSc: Asleep                 Chevelle Coulson,W. EDMOND

## 2022-06-30 NOTE — Anesthesia Preprocedure Evaluation (Addendum)
Anesthesia Evaluation  Patient identified by MRN, date of birth, ID band Patient awake    Reviewed: Allergy & Precautions, NPO status , Patient's Chart, lab work & pertinent test results  History of Anesthesia Complications (+) PONV and history of anesthetic complications  Airway Mallampati: II  TM Distance: >3 FB Neck ROM: Full    Dental no notable dental hx.    Pulmonary former smoker   Pulmonary exam normal        Cardiovascular hypertension, + DVT (2020)  Normal cardiovascular exam     Neuro/Psych    GI/Hepatic hiatal hernia,GERD  Controlled,,  Endo/Other  diabetes, Type 2, Insulin Dependent    Renal/GU Renal InsufficiencyRenal disease     Musculoskeletal  (+) Arthritis ,    Abdominal   Peds  Hematology   Anesthesia Other Findings   Reproductive/Obstetrics                             Anesthesia Physical Anesthesia Plan  ASA: 2  Anesthesia Plan: General   Post-op Pain Management: Tylenol PO (pre-op)*   Induction: Intravenous  PONV Risk Score and Plan: 4 or greater and Treatment may vary due to age or medical condition, Propofol infusion, Ondansetron, Dexamethasone, Midazolam and TIVA  Airway Management Planned: LMA  Additional Equipment: None  Intra-op Plan:   Post-operative Plan: Extubation in OR  Informed Consent: I have reviewed the patients History and Physical, chart, labs and discussed the procedure including the risks, benefits and alternatives for the proposed anesthesia with the patient or authorized representative who has indicated his/her understanding and acceptance.     Dental advisory given  Plan Discussed with: CRNA  Anesthesia Plan Comments:        Anesthesia Quick Evaluation

## 2022-06-30 NOTE — Op Note (Addendum)
Date of Surgery: 06/30/2022  INDICATIONS: Patient is a 72 y.o.-year-old female with traumatic injury to the right radial sagittal band of the middle finger after a forceful flicking motion 5 months ago.  She was found by an outside orthopedic group to have an injury to the radial sagittal band.  An attempt was made at conservative management with a relative motion/yoke orthosis.  At her initial visit with me, she was found to have persistent ulnar subluxation of the extensor tendon to the middle and ring fingers.  She is often unable to extend the fingers from a flexed position secondary to this tendon subluxation.  She is able to maintain finger extension once the fingers are passively extended from flexed.  This is quite bothersome and is interfering with her daily activities and quality of life.  We discussed continued conservative management with a relative motion/yoke orthosis to improve her function versus sagittal band repair or reconstruction.  Patient elected to proceed with surgical management.  Risks, benefits, and alternatives to surgery were again discussed with the patient in the preoperative area. The patient wishes to proceed with surgery.  Informed consent was signed after our discussion.   PREOPERATIVE DIAGNOSIS:  Right middle finger sagittal band insufficiency Right ring finger sagittal band insufficiency  POSTOPERATIVE DIAGNOSIS: Same.  PROCEDURE:  Right middle finger radial sagittal band reconstruction (20100) Right ring finger radial sagittal band reconstruction (71219)   SURGEON: Audria Nine, M.D.  ASSIST:   ANESTHESIA:  general  IV FLUIDS AND URINE: See anesthesia.  ESTIMATED BLOOD LOSS: <5 mL.  IMPLANTS: * No implants in log *   DRAINS: None  COMPLICATIONS: None  DESCRIPTION OF PROCEDURE: The patient was met in the preoperative holding area where the surgical site was marked and the consent form was signed.  The patient was then taken to the operating  room and transferred to the operating table.  All bony prominences were well padded.  A tourniquet was applied to the right upper arm.  General endotracheal anesthesia was induced.  The operative extremity was prepped and draped in the usual and sterile fashion.  A formal time-out was performed to confirm that this was the correct patient, surgery, side, and site.   Following formal timeout, the limb was gently exsanguinated with an Esmarch bandage and the tourniquet inflated to 250 mmHg.  Longitudinal incision was designed over both the middle and ring finger MCP joint.  Care was taken that the middle finger incision was slightly radial and the ring finger incision was slightly ulnar to provide as large a skin bridge possible.  I considered a transverse incision, however, I was concerned about the retraction necessary given her very thin skin.  The skin was incised.  I began with the middle finger.  Blunt dissection was used to identify the extensor tendon over the distal metacarpal and to the level of the MCP joint.  The sagittal band was identified. There was incompetence of the radial sagittal band which was quite patulous.  With passive flexion of the finger, the extensor tendon subluxated into the intermetacarpal valley on the ulnar side.  I tried to passively pull the extensor radially but had some difficulty as the ulnar sagittal band appeared somewhat scarred.  This was sharply released with a 15 blade scalpel.  The radial sagittal band appeared nonrepairable given the attenuation and redundant tissue.  I decided to proceed with reconstruction.  A distally based slip of the ulnar third of the extensor tendon was harvested.  The  ulnarly based extensor tendon strip was then passed underneath the tendon to the radial side.  A fine mosquito type forcep was used to pass this tendon strip from distal to proximal underneath the radial collateral ligament.  The tendon was then situated over the central portion of  the metacarpal head.  A tendon passer was used to create a Pulvertaft type weave of the harvested tendon strip through the intact tendon.  This was fixed using a 3-0 Ethibond suture in a horizontal mattress fashion.  Passive flexion and extension of the finger demonstrated that the extensor tendon was now centered over the MCP joint.  The remaining portion of the tendon strip was then sutured to the intact tendon again using a 3-0 Ethibond suture.  I then turned my attention to the ring finger.  Blunt dissection was again used to identify the extensor tendon of the MCP joint.  Similar to the middle finger, the radial sagittal band was overall intact but quite patulous and incompetent.  The ulnar sagittal band was similarly released.  A distally based ulnar strip of the extensor tendon was then harvested.  The width of the tendon strip was approximately one third of the overall tendon width.  As with the middle finger, the tendon slip was passed underneath the intact tendon in a radial direction.  A small mosquito forcep was then used to pass the tendon from a distal to proximal direction under the radial collateral ligament.  A tendon passer was then used to create a Pulvertaft weave of the tendon slip to the intact tendon.  This was fixed using a 3-0 Ethibond suture in horizontal mattress fashion.  Passive range of motion of the ring finger demonstrated that the extensor tendon was now centralized over the metacarpal head.  The remaining portion of the tendon slip was then sutured to the remaining tendon using a 3-0 Ethibond suture.  The wounds were then thoroughly irrigated with copious sterile saline.  The tourniquet was deflated.  Hemostasis was achieved with direct pressure over the wound.  The skin was then closed using a 4-0 nylon suture in a horizontal mattress fashion.  Great care was taken with the closure as her skin is quite thin.  The wounds were then dressed with Xeroform, bacitracin ointment,  folded Kerlix, cast padding, and a well-padded volar splint was applied with the wrist in slight extension, the MCP joints in approximately ten degrees of the flexion and the IP joints extended.   The patient was reversed from anesthesia and extubated uneventfully.  They were transferred from the operating table to the postoperative bed.  All counts were correct x 2 at the end of the procedure.  The patient was then taken to the PACU in stable condition.   POSTOPERATIVE PLAN: She will be discharged to home with appropriate pain medication and discharge instructions.  I will see her back in approximately 2 weeks for her first postop visit.  A referral will be made to hand therapy to begin the rehab protocol.  Audria Nine, MD 3:10 PM

## 2022-07-01 ENCOUNTER — Encounter (HOSPITAL_COMMUNITY): Payer: Self-pay | Admitting: Orthopedic Surgery

## 2022-07-02 ENCOUNTER — Encounter: Payer: Self-pay | Admitting: Gastroenterology

## 2022-07-06 ENCOUNTER — Other Ambulatory Visit: Payer: Self-pay | Admitting: Family Medicine

## 2022-07-06 DIAGNOSIS — E119 Type 2 diabetes mellitus without complications: Secondary | ICD-10-CM

## 2022-07-08 ENCOUNTER — Encounter: Payer: Self-pay | Admitting: Internal Medicine

## 2022-07-13 ENCOUNTER — Telehealth: Payer: Self-pay | Admitting: Anesthesiology

## 2022-07-13 NOTE — Telephone Encounter (Signed)
Telephone call to [facility], Paperwork received will fax it over now.  Note: Patient has a follow up 08/29/22.

## 2022-07-13 NOTE — Telephone Encounter (Signed)
Tiffany from [facility] left message with AN stating pt  has an upcoming appointment on 07/16/2022. Her plan of treatment has expired and they had faxed over a new one and have not received anything back yet.

## 2022-07-14 ENCOUNTER — Telehealth: Payer: Self-pay | Admitting: Anesthesiology

## 2022-07-14 NOTE — Telephone Encounter (Signed)
Tiffany from [facility] left message stating they have not received the new treatment plan for pt's next appointment to get her Vyepti. Call back number is [phone].

## 2022-07-15 ENCOUNTER — Encounter: Payer: Self-pay | Admitting: Neurology

## 2022-07-16 NOTE — Progress Notes (Signed)
Vyepti 300 mg PA started. [phone])  Approved Vaild 07/16/22-09/14/23 [id number]

## 2022-07-16 NOTE — Telephone Encounter (Signed)
Forms resent 07/15/21

## 2022-07-19 ENCOUNTER — Other Ambulatory Visit: Payer: Self-pay | Admitting: Family Medicine

## 2022-07-19 NOTE — Telephone Encounter (Signed)
Last office visit 05/11/22 for Pre-Op Exam.  Last refilled 08/20/21 for #12 with 3 refills.  Last Vit D level 08/17/21 which was low at 21.40 ng/mL.  Next Appt: 08/25/22 for CPE.

## 2022-08-06 ENCOUNTER — Telehealth: Payer: Self-pay | Admitting: *Deleted

## 2022-08-06 DIAGNOSIS — E559 Vitamin D deficiency, unspecified: Secondary | ICD-10-CM

## 2022-08-06 DIAGNOSIS — E041 Nontoxic single thyroid nodule: Secondary | ICD-10-CM

## 2022-08-06 DIAGNOSIS — E1169 Type 2 diabetes mellitus with other specified complication: Secondary | ICD-10-CM

## 2022-08-06 DIAGNOSIS — E1122 Type 2 diabetes mellitus with diabetic chronic kidney disease: Secondary | ICD-10-CM

## 2022-08-06 DIAGNOSIS — E1159 Type 2 diabetes mellitus with other circulatory complications: Secondary | ICD-10-CM

## 2022-08-06 NOTE — Telephone Encounter (Signed)
-----   Message from Velna Hatchet, RT sent at 08/02/2022 11:21 AM EST ----- Regarding: Wed 3/13 lab Patient is scheduled for cpx, please order future labs.  Thanks, Anda Kraft

## 2022-08-13 ENCOUNTER — Encounter: Payer: Self-pay | Admitting: Internal Medicine

## 2022-08-16 ENCOUNTER — Encounter: Payer: Self-pay | Admitting: Family Medicine

## 2022-08-16 ENCOUNTER — Inpatient Hospital Stay (HOSPITAL_COMMUNITY)
Admission: EM | Admit: 2022-08-16 | Discharge: 2022-08-18 | DRG: 872 | Disposition: A | Discharge status: Home / Self Care | Payer: Medicare Other | Attending: Internal Medicine | Admitting: Internal Medicine

## 2022-08-16 ENCOUNTER — Encounter (HOSPITAL_COMMUNITY): Payer: Self-pay

## 2022-08-16 ENCOUNTER — Other Ambulatory Visit: Payer: Self-pay

## 2022-08-16 ENCOUNTER — Emergency Department (HOSPITAL_COMMUNITY): Payer: Medicare Other

## 2022-08-16 ENCOUNTER — Ambulatory Visit (INDEPENDENT_AMBULATORY_CARE_PROVIDER_SITE_OTHER): Payer: Medicare Other | Admitting: Family Medicine

## 2022-08-16 ENCOUNTER — Encounter: Payer: Self-pay | Admitting: Gastroenterology

## 2022-08-16 VITALS — BP 120/70 | HR 99 | Temp 98.5°F | Ht 61.0 in | Wt 141.4 lb

## 2022-08-16 DIAGNOSIS — I129 Hypertensive chronic kidney disease with stage 1 through stage 4 chronic kidney disease, or unspecified chronic kidney disease: Secondary | ICD-10-CM | POA: Diagnosis present

## 2022-08-16 DIAGNOSIS — N1831 Chronic kidney disease, stage 3a: Secondary | ICD-10-CM

## 2022-08-16 DIAGNOSIS — D8989 Other specified disorders involving the immune mechanism, not elsewhere classified: Secondary | ICD-10-CM | POA: Diagnosis present

## 2022-08-16 DIAGNOSIS — E1142 Type 2 diabetes mellitus with diabetic polyneuropathy: Secondary | ICD-10-CM | POA: Diagnosis present

## 2022-08-16 DIAGNOSIS — Z8249 Family history of ischemic heart disease and other diseases of the circulatory system: Secondary | ICD-10-CM | POA: Diagnosis not present

## 2022-08-16 DIAGNOSIS — Z825 Family history of asthma and other chronic lower respiratory diseases: Secondary | ICD-10-CM | POA: Diagnosis not present

## 2022-08-16 DIAGNOSIS — Z1152 Encounter for screening for COVID-19: Secondary | ICD-10-CM | POA: Diagnosis not present

## 2022-08-16 DIAGNOSIS — I341 Nonrheumatic mitral (valve) prolapse: Secondary | ICD-10-CM | POA: Diagnosis present

## 2022-08-16 DIAGNOSIS — D84821 Immunodeficiency due to drugs: Secondary | ICD-10-CM | POA: Diagnosis present

## 2022-08-16 DIAGNOSIS — E785 Hyperlipidemia, unspecified: Secondary | ICD-10-CM | POA: Diagnosis present

## 2022-08-16 DIAGNOSIS — R4701 Aphasia: Secondary | ICD-10-CM | POA: Diagnosis present

## 2022-08-16 DIAGNOSIS — R42 Dizziness and giddiness: Secondary | ICD-10-CM | POA: Diagnosis not present

## 2022-08-16 DIAGNOSIS — R479 Unspecified speech disturbances: Secondary | ICD-10-CM

## 2022-08-16 DIAGNOSIS — Z886 Allergy status to analgesic agent status: Secondary | ICD-10-CM

## 2022-08-16 DIAGNOSIS — E559 Vitamin D deficiency, unspecified: Secondary | ICD-10-CM

## 2022-08-16 DIAGNOSIS — R52 Pain, unspecified: Secondary | ICD-10-CM | POA: Diagnosis not present

## 2022-08-16 DIAGNOSIS — Z794 Long term (current) use of insulin: Secondary | ICD-10-CM | POA: Diagnosis not present

## 2022-08-16 DIAGNOSIS — Z888 Allergy status to other drugs, medicaments and biological substances status: Secondary | ICD-10-CM

## 2022-08-16 DIAGNOSIS — Z7952 Long term (current) use of systemic steroids: Secondary | ICD-10-CM

## 2022-08-16 DIAGNOSIS — E1169 Type 2 diabetes mellitus with other specified complication: Secondary | ICD-10-CM | POA: Diagnosis not present

## 2022-08-16 DIAGNOSIS — Z87442 Personal history of urinary calculi: Secondary | ICD-10-CM

## 2022-08-16 DIAGNOSIS — M159 Polyosteoarthritis, unspecified: Secondary | ICD-10-CM | POA: Diagnosis present

## 2022-08-16 DIAGNOSIS — E041 Nontoxic single thyroid nodule: Secondary | ICD-10-CM

## 2022-08-16 DIAGNOSIS — E1122 Type 2 diabetes mellitus with diabetic chronic kidney disease: Secondary | ICD-10-CM

## 2022-08-16 DIAGNOSIS — I152 Hypertension secondary to endocrine disorders: Secondary | ICD-10-CM

## 2022-08-16 DIAGNOSIS — R509 Fever, unspecified: Secondary | ICD-10-CM | POA: Diagnosis not present

## 2022-08-16 DIAGNOSIS — R55 Syncope and collapse: Secondary | ICD-10-CM | POA: Diagnosis not present

## 2022-08-16 DIAGNOSIS — Z885 Allergy status to narcotic agent status: Secondary | ICD-10-CM

## 2022-08-16 DIAGNOSIS — Z79624 Long term (current) use of inhibitors of nucleotide synthesis: Secondary | ICD-10-CM

## 2022-08-16 DIAGNOSIS — N390 Urinary tract infection, site not specified: Secondary | ICD-10-CM | POA: Diagnosis present

## 2022-08-16 DIAGNOSIS — Z7901 Long term (current) use of anticoagulants: Secondary | ICD-10-CM

## 2022-08-16 DIAGNOSIS — Z86711 Personal history of pulmonary embolism: Secondary | ICD-10-CM

## 2022-08-16 DIAGNOSIS — Z79899 Other long term (current) drug therapy: Secondary | ICD-10-CM

## 2022-08-16 DIAGNOSIS — Z8261 Family history of arthritis: Secondary | ICD-10-CM | POA: Diagnosis not present

## 2022-08-16 DIAGNOSIS — E876 Hypokalemia: Secondary | ICD-10-CM | POA: Diagnosis present

## 2022-08-16 DIAGNOSIS — Z87891 Personal history of nicotine dependence: Secondary | ICD-10-CM | POA: Diagnosis not present

## 2022-08-16 DIAGNOSIS — K760 Fatty (change of) liver, not elsewhere classified: Secondary | ICD-10-CM | POA: Diagnosis present

## 2022-08-16 DIAGNOSIS — K219 Gastro-esophageal reflux disease without esophagitis: Secondary | ICD-10-CM | POA: Diagnosis present

## 2022-08-16 DIAGNOSIS — K861 Other chronic pancreatitis: Secondary | ICD-10-CM | POA: Diagnosis present

## 2022-08-16 DIAGNOSIS — A419 Sepsis, unspecified organism: Principal | ICD-10-CM | POA: Diagnosis present

## 2022-08-16 DIAGNOSIS — Z803 Family history of malignant neoplasm of breast: Secondary | ICD-10-CM

## 2022-08-16 DIAGNOSIS — E1159 Type 2 diabetes mellitus with other circulatory complications: Secondary | ICD-10-CM

## 2022-08-16 DIAGNOSIS — Z86718 Personal history of other venous thrombosis and embolism: Secondary | ICD-10-CM

## 2022-08-16 DIAGNOSIS — Z880 Allergy status to penicillin: Secondary | ICD-10-CM

## 2022-08-16 LAB — CBC WITH DIFFERENTIAL/PLATELET
Basophils Absolute: 0 10*3/uL (ref 0.0–0.1)
Basophils Relative: 0.5 % (ref 0.0–3.0)
Eosinophils Absolute: 0.1 10*3/uL (ref 0.0–0.7)
Eosinophils Relative: 1.2 % (ref 0.0–5.0)
HCT: 40.7 % (ref 36.0–46.0)
Hemoglobin: 13.3 g/dL (ref 12.0–15.0)
Lymphocytes Relative: 12.8 % (ref 12.0–46.0)
Lymphs Abs: 1.1 10*3/uL (ref 0.7–4.0)
MCHC: 32.7 g/dL (ref 30.0–36.0)
MCV: 86.2 fl (ref 78.0–100.0)
Monocytes Absolute: 0.6 10*3/uL (ref 0.1–1.0)
Monocytes Relative: 6.5 % (ref 3.0–12.0)
Neutro Abs: 6.8 10*3/uL (ref 1.4–7.7)
Neutrophils Relative %: 79 % — ABNORMAL HIGH (ref 43.0–77.0)
Platelets: 289 10*3/uL (ref 150.0–400.0)
RBC: 4.72 Mil/uL (ref 3.87–5.11)
RDW: 18.8 % — ABNORMAL HIGH (ref 11.5–15.5)
WBC: 8.6 10*3/uL (ref 4.0–10.5)

## 2022-08-16 LAB — MICROALBUMIN / CREATININE URINE RATIO
Creatinine,U: 191 mg/dL
Microalb Creat Ratio: 2.9 mg/g (ref 0.0–30.0)
Microalb, Ur: 5.5 mg/dL — ABNORMAL HIGH (ref 0.0–1.9)

## 2022-08-16 LAB — LIPID PANEL
Cholesterol: 254 mg/dL — ABNORMAL HIGH (ref 0–200)
HDL: 47.1 mg/dL (ref >39.00)
NonHDL: 206.49
Total CHOL/HDL Ratio: 5
Triglycerides: 276 mg/dL — ABNORMAL HIGH (ref 0.0–149.0)
VLDL: 55.2 mg/dL — ABNORMAL HIGH (ref 0.0–40.0)

## 2022-08-16 LAB — DIFFERENTIAL
Abs Immature Granulocytes: 0.08 10*3/uL — ABNORMAL HIGH (ref 0.00–0.07)
Basophils Absolute: 0 10*3/uL (ref 0.0–0.1)
Basophils Relative: 0 %
Eosinophils Absolute: 0.1 10*3/uL (ref 0.0–0.5)
Eosinophils Relative: 0 %
Immature Granulocytes: 1 %
Lymphocytes Relative: 4 %
Lymphs Abs: 0.5 10*3/uL — ABNORMAL LOW (ref 0.7–4.0)
Monocytes Absolute: 0.6 10*3/uL (ref 0.1–1.0)
Monocytes Relative: 5 %
Neutro Abs: 11.6 10*3/uL — ABNORMAL HIGH (ref 1.7–7.7)
Neutrophils Relative %: 90 %

## 2022-08-16 LAB — CBG MONITORING, ED: Glucose-Capillary: 285 mg/dL — ABNORMAL HIGH (ref 70–99)

## 2022-08-16 LAB — CBC
HCT: 36.3 % (ref 36.0–46.0)
Hemoglobin: 11.6 g/dL — ABNORMAL LOW (ref 12.0–15.0)
MCH: 28 pg (ref 26.0–34.0)
MCHC: 32 g/dL (ref 30.0–36.0)
MCV: 87.5 fL (ref 80.0–100.0)
Platelets: 224 10*3/uL (ref 150–400)
RBC: 4.15 MIL/uL (ref 3.87–5.11)
RDW: 17.8 % — ABNORMAL HIGH (ref 11.5–15.5)
WBC: 12.8 10*3/uL — ABNORMAL HIGH (ref 4.0–10.5)
nRBC: 0 % (ref 0.0–0.2)

## 2022-08-16 LAB — HEPATIC FUNCTION PANEL
ALT: 17 U/L (ref 0–35)
AST: 17 U/L (ref 0–37)
Albumin: 3.3 g/dL — ABNORMAL LOW (ref 3.5–5.2)
Alkaline Phosphatase: 81 U/L (ref 39–117)
Bilirubin, Direct: 0.1 mg/dL (ref 0.0–0.3)
Total Bilirubin: 0.4 mg/dL (ref 0.2–1.2)
Total Protein: 6.4 g/dL (ref 6.0–8.3)

## 2022-08-16 LAB — VITAMIN D 25 HYDROXY (VIT D DEFICIENCY, FRACTURES): VITD: 36.83 ng/mL (ref 30.00–100.00)

## 2022-08-16 LAB — I-STAT CHEM 8, ED
BUN: 7 mg/dL — ABNORMAL LOW (ref 8–23)
Calcium, Ion: 1.01 mmol/L — ABNORMAL LOW (ref 1.15–1.40)
Chloride: 98 mmol/L (ref 98–111)
Creatinine, Ser: 1.1 mg/dL — ABNORMAL HIGH (ref 0.44–1.00)
Glucose, Bld: 284 mg/dL — ABNORMAL HIGH (ref 70–99)
HCT: 36 % (ref 36.0–46.0)
Hemoglobin: 12.2 g/dL (ref 12.0–15.0)
Potassium: 4.1 mmol/L (ref 3.5–5.1)
Sodium: 133 mmol/L — ABNORMAL LOW (ref 135–145)
TCO2: 25 mmol/L (ref 22–32)

## 2022-08-16 LAB — RESP PANEL BY RT-PCR (RSV, FLU A&B, COVID)  RVPGX2
Influenza A by PCR: NEGATIVE
Influenza B by PCR: NEGATIVE
Resp Syncytial Virus by PCR: NEGATIVE
SARS Coronavirus 2 by RT PCR: NEGATIVE

## 2022-08-16 LAB — COMPREHENSIVE METABOLIC PANEL
ALT: 19 U/L (ref 0–44)
AST: 24 U/L (ref 15–41)
Albumin: 2.3 g/dL — ABNORMAL LOW (ref 3.5–5.0)
Alkaline Phosphatase: 69 U/L (ref 38–126)
Anion gap: 12 (ref 5–15)
BUN: 7 mg/dL — ABNORMAL LOW (ref 8–23)
CO2: 24 mmol/L (ref 22–32)
Calcium: 7.9 mg/dL — ABNORMAL LOW (ref 8.9–10.3)
Chloride: 96 mmol/L — ABNORMAL LOW (ref 98–111)
Creatinine, Ser: 1.13 mg/dL — ABNORMAL HIGH (ref 0.44–1.00)
GFR, Estimated: 52 mL/min — ABNORMAL LOW (ref >60)
Glucose, Bld: 293 mg/dL — ABNORMAL HIGH (ref 70–99)
Potassium: 4 mmol/L (ref 3.5–5.1)
Sodium: 132 mmol/L — ABNORMAL LOW (ref 135–145)
Total Bilirubin: 0.6 mg/dL (ref 0.3–1.2)
Total Protein: 5.8 g/dL — ABNORMAL LOW (ref 6.5–8.1)

## 2022-08-16 LAB — BASIC METABOLIC PANEL
BUN: 10 mg/dL (ref 6–23)
CO2: 28 mEq/L (ref 19–32)
Calcium: 9.2 mg/dL (ref 8.4–10.5)
Chloride: 97 mEq/L (ref 96–112)
Creatinine, Ser: 1.04 mg/dL (ref 0.40–1.20)
GFR: 54.16 mL/min — ABNORMAL LOW (ref >60.00)
Glucose, Bld: 289 mg/dL — ABNORMAL HIGH (ref 70–99)
Potassium: 3.4 mEq/L — ABNORMAL LOW (ref 3.5–5.1)
Sodium: 137 mEq/L (ref 135–145)

## 2022-08-16 LAB — POC INFLUENZA A&B (BINAX/QUICKVUE)
Influenza A, POC: NEGATIVE
Influenza B, POC: NEGATIVE

## 2022-08-16 LAB — PROTIME-INR
INR: 1.3 — ABNORMAL HIGH (ref 0.8–1.2)
Prothrombin Time: 15.9 seconds — ABNORMAL HIGH (ref 11.4–15.2)

## 2022-08-16 LAB — TSH: TSH: 1.4 u[IU]/mL (ref 0.35–5.50)

## 2022-08-16 LAB — POC COVID19 BINAXNOW: SARS Coronavirus 2 Ag: NEGATIVE

## 2022-08-16 LAB — LACTIC ACID, PLASMA
Lactic Acid, Venous: 2 mmol/L (ref 0.5–1.9)
Lactic Acid, Venous: 1.6 mmol/L (ref 0.5–1.9)

## 2022-08-16 LAB — LDL CHOLESTEROL, DIRECT: Direct LDL: 159 mg/dL

## 2022-08-16 LAB — HEMOGLOBIN A1C: Hgb A1c MFr Bld: 8.4 % — ABNORMAL HIGH (ref 4.6–6.5)

## 2022-08-16 LAB — APTT: aPTT: 26 seconds (ref 24–36)

## 2022-08-16 LAB — ETHANOL: Alcohol, Ethyl (B): <10 mg/dL (ref <10)

## 2022-08-16 MED ORDER — METRONIDAZOLE 500 MG/100ML IV SOLN
500.0000 mg | Freq: Once | INTRAVENOUS | Status: AC
  Administered 2022-08-16: 500 mg via INTRAVENOUS
  Filled 2022-08-16: qty 100

## 2022-08-16 MED ORDER — LACTATED RINGERS IV SOLN: INTRAVENOUS | Status: DC

## 2022-08-16 MED ORDER — ACETAMINOPHEN 325 MG PO TABS
650.0000 mg | ORAL_TABLET | Freq: Once | ORAL | Status: AC
  Administered 2022-08-16: 650 mg via ORAL
  Filled 2022-08-16: qty 2

## 2022-08-16 MED ORDER — LACTATED RINGERS IV BOLUS
1000.0000 mL | Freq: Once | INTRAVENOUS | Status: AC
  Administered 2022-08-16: 1000 mL via INTRAVENOUS

## 2022-08-16 MED ORDER — VANCOMYCIN HCL IN DEXTROSE 1-5 GM/200ML-% IV SOLN
1000.0000 mg | Freq: Once | INTRAVENOUS | Status: DC
  Filled 2022-08-16: qty 200

## 2022-08-16 MED ORDER — SODIUM CHLORIDE 0.9 % IV SOLN
2.0000 g | Freq: Once | INTRAVENOUS | Status: DC
  Filled 2022-08-16: qty 10

## 2022-08-16 MED ORDER — SODIUM CHLORIDE 0.9% FLUSH: 3.0000 mL | Freq: Once | INTRAVENOUS | Status: DC

## 2022-08-16 MED ORDER — IOHEXOL 350 MG/ML SOLN
75.0000 mL | Freq: Once | INTRAVENOUS | Status: AC | PRN
  Administered 2022-08-16: 75 mL via INTRAVENOUS

## 2022-08-16 MED ORDER — VANCOMYCIN HCL 750 MG/150ML IV SOLN: 750.0000 mg | INTRAVENOUS | Status: DC

## 2022-08-16 MED ORDER — SODIUM CHLORIDE 0.9 % IV SOLN
2.0000 g | Freq: Two times a day (BID) | INTRAVENOUS | Status: DC
  Administered 2022-08-16: 2 g via INTRAVENOUS
  Filled 2022-08-16: qty 12.5

## 2022-08-16 MED ORDER — METOCLOPRAMIDE HCL 5 MG/ML IJ SOLN
10.0000 mg | Freq: Once | INTRAMUSCULAR | Status: AC
  Administered 2022-08-16: 10 mg via INTRAVENOUS
  Filled 2022-08-16: qty 2

## 2022-08-16 MED ORDER — VANCOMYCIN HCL 1500 MG/300ML IV SOLN
1500.0000 mg | Freq: Once | INTRAVENOUS | Status: AC
  Administered 2022-08-16: 1500 mg via INTRAVENOUS
  Filled 2022-08-16: qty 300

## 2022-08-16 MED ORDER — LACTATED RINGERS IV BOLUS (SEPSIS)
1000.0000 mL | Freq: Once | INTRAVENOUS | Status: AC
  Administered 2022-08-16: 1000 mL via INTRAVENOUS

## 2022-08-16 NOTE — Code Documentation (Signed)
Debra Leach is a 72 yr old female with a PMH of DVT, PE on Eliquis, migraines, CKD and DM. She presents today to [facility] with a chief complaint of aphasia. Pt has been sick with a fever for a few days, but husband noticed that at 1500 today, she has had difficulty speaking. Pt is from home.    Pt met my stroke team at bridge. CBG obtained, airway cleared by EDP. Pt to CT with team. NIHSS 2, for aphasia and stated the wrong month. Pt also has a H/A. The following imaging was obtained: CT, CTA. Per Dr. Leonel Ramsay, CT is neg for hemorrhage, and CTA is negative for LVO.     Pt back to ED room Trauma B where her workup will continue. She will need q 2 hr VS and NIHSS for 12 hr, then q 4 hrs. Pt ineligible for thrombolytics as she takes Eliquis. Pt not a candidate for Mechanical Thrombectomy as she is LVO negative. Bedside handoff with ED RN complete.

## 2022-08-16 NOTE — Consult Note (Addendum)
Neurology Consultation Reason for Consult: Speech Difficulty Referring Physician: Zenia Resides, A  CC: speech difficulty  History is obtained from:patient  HPI: Debra Leach is a 72 y.o. female with a history of DVT/PE on eliquis who presents with speech difficulty. She states that she has had multiple episodes where she has difficulty speaking with headaches.  She states this happens a couple times a week.  Since Friday, however, she has been having more difficulty with speech and has been feeling bad with fevers.  Today, around 3 PM her speech got worse and therefore 911 was called and a code stroke was activated.  Her temperature with EMS was 102.   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   MVP (mitral valve prolapse)    history of   PONV (postoperative nausea and vomiting)    Pulmonary emboli ([place]) 03/01/2019   2 in the right; 1 in the left     Family History  Problem Relation Age of Onset   Cancer Mother        bone   Hypertension Father    Mitral valve prolapse Father    Asthma Brother    Arthritis Brother    Nephrolithiasis Brother    Asthma Brother    COPD Brother    Breast cancer Maternal Aunt    Breast cancer Maternal Aunt    Colon cancer Neg Hx    Esophageal cancer Neg  Hx    Inflammatory bowel disease Neg Hx    Liver disease Neg Hx    Pancreatic cancer Neg Hx    Rectal cancer Neg Hx    Stomach cancer Neg Hx      Social History:  reports that she quit smoking about 31 years ago. Her smoking use included cigarettes. She has a 13.00 pack-year smoking history. She has never been exposed to tobacco smoke. She has never used smokeless tobacco. She reports that she does not drink alcohol and does not use drugs.   Exam: Current vital signs: Wt 64.2 kg   BMI 26.74 kg/m  Vital signs in last 24 hours: Temp:  [98.5 F (36.9 C)] 98.5 F (36.9 C) (03/11 1121) Pulse Rate:  [99] 99 (03/11 1121) BP: (120)/(70) 120/70 (03/11 1121) SpO2:  [100 %] 100 % (03/11 1121) Weight:  [64.1 kg-64.2 kg] 64.2 kg (03/11 1900)   Physical Exam  Appears well-developed and well-nourished.   Neuro: Mental Status: Patient is awake, alert, oriented to person, place, age, gives month as February.  She does appear to be aphasic, expressive > receptive Cranial Nerves: II: Visual Fields are full. Pupils are equal, round, and  reactive to light.   III,IV, VI: EOMI without ptosis or diploplia.  V: Facial sensation is symmetric to temperature VII: Facial movement is symmetric.  VIII: hearing is intact to voice X: Uvula elevates symmetrically XI: Shoulder shrug is symmetric. XII: tongue is midline without atrophy or fasciculations.  Motor: Tone is normal. Bulk is normal. 5/5 strength was present in all four extremities.  Sensory: Sensation is symmetric to light touch and temperature in the arms and legs. Cerebellar: No ataxia on FNF or HKS      I have reviewed labs in epic and the results pertinent to this consultation are: CBG - 285  I have reviewed the images obtained:CT head- negative.   Impression: 72 yo F with speech difficulty in the setting of fever.  With her report of similar symptoms happening a couple of times a week, my suspicion is that this simply  represents worsening of her normal symptoms in the setting of infection.  I have low suspicion for CNS infection at this time.  She does have some headache, but states that this is her typical headache and she has had daily headaches since age 25.  Another possibility is that she has an underlying aphasia that is unmasked by her migraines on a regular basis, and could be unmasked in a similar fashion by an inflammatory process causing fever.  Recommendations: 1) MRI brain 2) if MRI is negative, would treat as complicated migraine given her reported history of a couple of complicated migraines per week affecting her speech. 3) Fever workup per ED    Roland Rack, MD [facility] [phone]  If 7pm- 7am, please page neurology on call as listed in [place].

## 2022-08-16 NOTE — ED Provider Notes (Signed)
I saw and evaluated the patient, reviewed the resident's note and I agree with the findings and plan.  EKG Interpretation  Date/Time:  Monday August 16 2022 19:33:49 EDT Ventricular Rate:  134 PR Interval:  160 QRS Duration: 126 QT Interval:  282 QTC Calculation: 421 R Axis:   -16 Text Interpretation: Sinus tachycardia Nonspecific intraventricular conduction delay Anterior infarct, old Minimal ST elevation, inferior leads Artifact in lead(s) I II III aVR aVL aVF V1 V2 V5 V6 Confirmed by Lacretia Leigh ([id number]) on 08/16/2022 8:10:47 PM   Patient presents complaining of expressive aphasia since about 3 PM.  Has history of migraines.  Has neurological symptoms with those.  Also has leg symptoms for 3 days.  Temperature here 100.9.  She is also tachycardic.  On exam here, patient is alert and oriented x 4.  No focal neurological deficits.  Has been seen by neurology who recommends patient MRI.  Will perform infectious workup here.   Lacretia Leigh, MD 08/16/22 2012

## 2022-08-16 NOTE — Sepsis Progress Note (Signed)
Elink monitoring for the code sepsis protocol.  

## 2022-08-16 NOTE — Progress Notes (Unsigned)
Debra Bolin T. Ireland Chagnon, MD, [facility] [place] [facility] [place], [address]  Phone: [phone]  FAX: [phone]  Debra Leach - 72 y.o. female  MRN [id number]  Date of Birth: [birth year]  Date: 08/16/2022  PCP: [name], MD  Referral: [name], MD  Chief Complaint  Patient presents with   Loss of Consciousness    On Friday   Fever   Generalized Body Aches        Subjective:   Debra Leach is a 72 y.o. very pleasant female patient with Body mass index is 26.71 kg/m. who presents with the following:  She is a very well-known patient, she presents today acutely ill with some flulike symptoms.  She is concerned she has influenza or COVID.  She also had a profound presyncopal episode last Friday.  Friday morning, got up, was getting dressed and she got lightheaded.  Thought that she was lightheaded.  Got more and more lightheaded.  By the time husband got there, eyes were open, but she was not responding.   Laid on her husband's forearm.  She went completely out for at least a few seconds.   - normal BS right afer then.   She was dripping sweat.  She felt like dead weight, and her husband could not pick her up.  She was being stubborn, and did not want to go to the hospital.   Still not clear, did not initially know what day it is.  Feb. 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   Starting to feel much better.  103.5 temp at home on Friday, and it has been getting better each day.  She has had some coughing, aching chills, as well as some respiratory symptoms.  No seizure activity.  Was unconscious for 30 seconds.   Has felt nauseated.  Felt like she would throw up.  Everything is better today, but no appetite.   History is significant for diabetes, but they did check her blood sugar at the time and it was normal.  Review of Systems is noted in the HPI, as  appropriate  Objective:   BP 120/70   Pulse 99   Temp 98.5 F (36.9 C) (Temporal)   Ht '5\' 1"'$  (1.549 m)   Wt 141 lb 6 oz (64.1 kg)   SpO2 100%   BMI 26.71 kg/m    Gen: WDWN, NAD. Globally Non-toxic HEENT: Throat clear, w/o exudate, R TM clear, L TM - good landmarks, No fluid present. rhinnorhea.  MMM Frontal sinuses: NT Max sinuses: NT NECK: Anterior cervical  LAD is absent CV: RRR, No M/G/R, cap refill <2 sec PULM: Breathing comfortably in no respiratory distress. no wheezing, crackles, rhonchi   Laboratory and Imaging Data: Results for orders placed or performed in visit on [id number]  Basic metabolic panel  Result Value Ref Range   Sodium 137 135 - 145 mEq/L   Potassium 3.4 (L) 3.5 - 5.1 mEq/L   Chloride 97 96 - 112 mEq/L   CO2 28 19 - 32 mEq/L   Glucose, Bld 289 (H) 70 - 99 mg/dL   BUN 10 6 - 23 mg/dL   Creatinine, Ser 1.04 0.40 - 1.20 mg/dL   GFR 54.16 (L) >60.00 mL/min   Calcium 9.2 8.4 - 10.5 mg/dL  CBC with Differential/Platelet  Result Value Ref Range   WBC 8.6 4.0 - 10.5 K/uL   RBC 4.72 3.87 -  5.11 Mil/uL   Hemoglobin 13.3 12.0 - 15.0 g/dL   HCT 40.7 36.0 - 46.0 %   MCV 86.2 78.0 - 100.0 fl   MCHC 32.7 30.0 - 36.0 g/dL   RDW 18.8 (H) 11.5 - 15.5 %   Platelets 289.0 150.0 - 400.0 K/uL   Neutrophils Relative % 79.0 (H) 43.0 - 77.0 %   Lymphocytes Relative 12.8 12.0 - 46.0 %   Monocytes Relative 6.5 3.0 - 12.0 %   Eosinophils Relative 1.2 0.0 - 5.0 %   Basophils Relative 0.5 0.0 - 3.0 %   Neutro Abs 6.8 1.4 - 7.7 K/uL   Lymphs Abs 1.1 0.7 - 4.0 K/uL   Monocytes Absolute 0.6 0.1 - 1.0 K/uL   Eosinophils Absolute 0.1 0.0 - 0.7 K/uL   Basophils Absolute 0.0 0.0 - 0.1 K/uL  Hepatic function panel  Result Value Ref Range   Total Bilirubin 0.4 0.2 - 1.2 mg/dL   Bilirubin, Direct 0.1 0.0 - 0.3 mg/dL   Alkaline Phosphatase 81 39 - 117 U/L   AST 17 0 - 37 U/L   ALT 17 0 - 35 U/L   Total Protein 6.4 6.0 - 8.3 g/dL   Albumin 3.3 (L) 3.5 - 5.2 g/dL  Vitamin D,  25-hydroxy  Result Value Ref Range   VITD 36.83 30.00 - 100.00 ng/mL  TSH  Result Value Ref Range   TSH 1.40 0.35 - 5.50 uIU/mL  Microalbumin / creatinine urine ratio  Result Value Ref Range   Microalb, Ur 5.5 (H) 0.0 - 1.9 mg/dL   Creatinine,U 191.0 mg/dL   Microalb Creat Ratio 2.9 0.0 - 30.0 mg/g  Lipid panel  Result Value Ref Range   Cholesterol 254 (H) 0 - 200 mg/dL   Triglycerides 276.0 (H) 0.0 - 149.0 mg/dL   HDL 47.10 >39.00 mg/dL   VLDL 55.2 (H) 0.0 - 40.0 mg/dL   Total CHOL/HDL Ratio 5    NonHDL 206.49   Hemoglobin A1c  Result Value Ref Range   Hgb A1c MFr Bld 8.4 (H) 4.6 - 6.5 %  LDL cholesterol, direct  Result Value Ref Range   Direct LDL 159.0 mg/dL  POC COVID-19  Result Value Ref Range   SARS Coronavirus 2 Ag Negative Negative  POC Influenza A&B (Binax test)  Result Value Ref Range   Influenza A, POC Negative Negative   Influenza B, POC Negative Negative     Assessment and Plan:     ICD-10-CM   1. Postural dizziness with presyncope  [id number] Basic metabolic panel   99991111 CBC with Differential/Platelet    Hepatic function panel    2. Fever, unspecified fever cause  R50.9 POC COVID-19    POC Influenza A&B (Binax test)    3. Body aches  R52 POC COVID-19    POC Influenza A&B (Binax test)    4. Vitamin D deficiency  E55.9 Vitamin D, 25-hydroxy    5. Thyroid nodule  E04.1 TSH    6. Type 2 diabetes mellitus with stage 3a chronic kidney disease, with long-term current use of insulin (HCC)  E11.22 Microalbumin / creatinine urine ratio   N18.31 Hemoglobin A1c   Z79.4 CANCELED: Comprehensive metabolic panel    7. Hyperlipidemia associated with type 2 diabetes mellitus (HCC)  E11.69 Lipid panel   E78.5 CANCELED: Comprehensive metabolic panel    8. Hypertension associated with diabetes ([place])  E11.59 CANCELED: Comprehensive metabolic panel   [id number]      She certainly is recovering from a flulike illness  with a fairly high temperature, body aches, arthralgia, and  she is thankfully improved quite a bit today compared to the end of last week.  Influenza and COVID testing is negative.  At this point, she needs to have her body continue to heal itself, and I think that she will do well.  Presyncope versus syncope in the setting of fever of greater than 103, acute illness and positional change.  This seems to be most likely vasovagal in this particular setting.  Orthostasis and dehydration are also high in the differential.  No seizure activity.  No head trauma.  The want to check basic labs to assure that they are normal, and at this time the only significant labs that are abnormal are her hemoglobin A1c and lipids.  She has a follow-up with her primary care doctor in 1 week, and she has known diabetes.  I am fairly confident that is safe to assume that this all relates to her acute illness.  She and I talked, and if she has any other similar presyncopal or syncopal episodes this needs to have a more dedicated, full-scale workup.  Medication Management during today's office visit: No orders of the defined types were placed in this encounter.  Medications Discontinued During This Encounter  Medication Reason   insulin glargine (LANTUS SOLOSTAR) 100 UNIT/ML Solostar Pen Duplicate    Orders placed today for conditions managed today: Orders Placed This Encounter  Procedures   Basic metabolic panel   CBC with Differential/Platelet   Hepatic function panel   LDL cholesterol, direct   POC COVID-19   POC Influenza A&B (Binax test)    Disposition: No follow-ups on file.  [employer] One speech-to-text software was used for transcription in this dictation.  Possible transcriptional errors can occur using Editor, commissioning.   Signed,  Maud Deed. Smokey Melott, MD   No facility-administered encounter medications on file as of 08/16/2022.   Outpatient Encounter Medications as of 08/16/2022  Medication Sig   atorvastatin (LIPITOR) 80 MG tablet Take 1 tablet  (80 mg total) by mouth at bedtime.   azaTHIOprine (IMURAN) 50 MG tablet Take 1 tablet (50 mg total) by mouth daily.   Blood Glucose Monitoring Suppl (ONETOUCH VERIO REFLECT) w/Device KIT TEST TWICE A DAY   budesonide (ENTOCORT EC) 3 MG 24 hr capsule Take 2 capsules (6 mg total) by mouth daily.   Cholecalciferol (VITAMIN D3) 1.25 MG (50000 UT) CAPS Take 1 capsule by mouth once a week. (Patient not taking: Reported on 08/17/2022)   colesevelam (WELCHOL) 625 MG tablet TAKE 3 TABLETS BY MOUTH 2 TIMES DAILY WITH A MEAL. (Patient taking differently: Take 1,875 mg by mouth 2 (two) times daily with a meal.)   Continuous Blood Gluc Sensor (DEXCOM G7 SENSOR) MISC 1 Device by Does not apply route as directed.   ELIQUIS 2.5 MG TABS tablet TAKE 1 TABLET BY MOUTH 2 TIMES DAILY. (Patient taking differently: Take 2.5 mg by mouth 2 (two) times daily.)   Eptinezumab-jjmr (VYEPTI) 100 MG/ML injection Inject 300 mg into the vein every 3 (three) months.   ferrous gluconate (FERGON) 324 MG tablet Take 1 tablet (324 mg total) by mouth daily with breakfast.   gabapentin (NEURONTIN) 100 MG capsule TAKE 1 CAPSULE BY MOUTH UP TO 3 TIMES DAILY AS NEEDED. (Patient taking differently: Take 100 mg by mouth 3 (three) times daily as needed (Neuropathic pain).)   gabapentin (NEURONTIN) 300 MG capsule TAKE 2 CAPSULES BY MOUTH AT BEDTIME. (Patient taking differently: Take 600 mg  by mouth at bedtime.)   insulin glargine (LANTUS SOLOSTAR) 100 UNIT/ML Solostar Pen Inject 18 Units into the skin daily.   Lancets (ONETOUCH DELICA PLUS Q000111Q) MISC USE TO CHECK BLOOD SUGAR TWO TIMES DAILY   omeprazole (PRILOSEC) 20 MG capsule Take 20 mg by mouth daily with supper.   ondansetron (ZOFRAN) 4 MG tablet Take 1 tablet (4 mg total) by mouth every 8 (eight) hours as needed for nausea or vomiting.   ONETOUCH VERIO test strip USE TO CHECK BLOOD SUGAR TWO TIMES A DAY   potassium chloride SA (KLOR-CON M) 20 MEQ tablet TAKE 1 TABLET BY MOUTH DAILY.  (Patient taking differently: Take 20 mEq by mouth daily.)   repaglinide (PRANDIN) 0.5 MG tablet Take 2 tablets (1 mg total) by mouth daily before lunch. before breakfast and lunch (Patient taking differently: Take 1 mg by mouth daily before lunch.)   UBRELVY 100 MG TABS TAKE 1 TABLET BY MOUTH AS NEEDED (MAY REPEAT DOSE AFTER 2 HOURS IF NEEDED. MAXIMUM 2 TABLETS IN 24 HOURS).   vitamin B-12 (CYANOCOBALAMIN) 1000 MCG tablet Take 1,000 mcg by mouth daily with breakfast.   Vitamin D, Ergocalciferol, (DRISDOL) 1.25 MG (50000 UNIT) CAPS capsule TAKE 1 CAPSULE BY MOUTH ONCE A WEEK.   [DISCONTINUED] alum & mag hydroxide-simeth (MAALOX/MYLANTA) 200-200-20 MG/5ML suspension Take 30 mLs by mouth every 6 (six) hours as needed for indigestion or heartburn.   [DISCONTINUED] insulin glargine (LANTUS SOLOSTAR) 100 UNIT/ML Solostar Pen Inject 24 Units into the skin daily. (Patient taking differently: Inject 22 Units into the skin every morning.)

## 2022-08-16 NOTE — ED Triage Notes (Signed)
Pt to ED via EMS from home. Pt's husband reports expressive aphasia since 3pm 3/11. LKW 3pm 3/11. Pt also reports flu like symptoms x3 days. Pt went to PCP today and had neg flu and covid test. Pt has fever of 100.9 oral. 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   EMS Vitals: 20 LAC 150/70 125 HR 280 CBG

## 2022-08-16 NOTE — ED Provider Notes (Incomplete)
[facility] Provider Note   CSN: [id number] Arrival date & time: 08/16/22  1900  An emergency department physician performed an initial assessment on this suspected stroke patient at 1900.  History {Add pertinent medical, surgical, social history, OB history to HPI:1} Chief Complaint  Patient presents with  . Code Stroke    Debra Leach is a 72 y.o. 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  She does have a history of speech difficulty with her migraines in the past, but husband states they have always been more like stuttering, and today she was able to speak worse with any respiratory concerns.  She denies any other symptoms of numbness or weakness in her extremities. She has had fever since Friday, highest 104 today.  She has had associated nausea and vomiting with decreased appetite over the past few days.  Denies dysuria or hematuria, but has been urinating less.  Last bowel movement at least 4 days ago. Denies chest pain, cough, abdominal pain, recent falls, hematuria, hematochezia.  HPI     Home Medications Prior to Admission medications   Medication Sig Start Date End Date Taking? Authorizing Provider  alum & mag hydroxide-simeth (MAALOX/MYLANTA) 200-200-20 MG/5ML suspension Take 30 mLs by mouth every 6 (six) hours as needed for indigestion or heartburn.    [provider]  atorvastatin (LIPITOR) 80 MG tablet Take 1 tablet (80 mg total) by mouth at bedtime. 11/03/21   Jennye Boroughs, MD  azaTHIOprine (IMURAN) 50 MG tablet Take 1 tablet (50 mg total) by mouth daily. 05/04/22   Mansouraty, Telford Nab., MD  Blood Glucose Monitoring Suppl (ONETOUCH VERIO REFLECT) w/Device KIT TEST TWICE A DAY 07/06/22   Bedsole, Amy E, MD  budesonide  (ENTOCORT EC) 3 MG 24 hr capsule Take 2 capsules (6 mg total) by mouth daily. 02/25/22   Mansouraty, Telford Nab., MD  Cholecalciferol (VITAMIN D3) 1.25 MG (50000 UT) CAPS Take 1 capsule by mouth once a week. 08/20/21   Bedsole, Amy E, MD  colesevelam (WELCHOL) 625 MG tablet TAKE 3 TABLETS BY MOUTH 2 TIMES DAILY WITH A MEAL. 08/31/21   Bedsole, Amy E, MD  Continuous Blood Gluc Sensor (DEXCOM G7 SENSOR) MISC 1 Device by Does not apply route as directed. 06/08/22   Shamleffer, Melanie Crazier, MD  ELIQUIS 2.5 MG TABS tablet TAKE 1 TABLET BY MOUTH 2 TIMES DAILY. 06/13/22   Bedsole, Amy E, MD  Eptinezumab-jjmr (VYEPTI) 100 MG/ML injection Inject 300 mg into the vein every 3 (three) months.    [provider]  ferrous gluconate (FERGON) 324 MG tablet Take 1 tablet (324 mg total) by mouth daily with breakfast. 02/15/22   Mansouraty, Telford Nab., MD  gabapentin (NEURONTIN) 100 MG capsule TAKE 1 CAPSULE BY MOUTH UP TO 3 TIMES DAILY AS NEEDED. 10/07/21   Tomi Likens, Adam R, DO  gabapentin (NEURONTIN) 300 MG capsule TAKE 2 CAPSULES BY MOUTH AT BEDTIME. 06/14/22   Tomi Likens, Adam R, DO  insulin glargine (LANTUS SOLOSTAR) 100 UNIT/ML Solostar Pen Inject 18 Units into the skin daily.    [provider]  Lancets (ONETOUCH DELICA PLUS [id number]) [place] USE TO CHECK BLOOD SUGAR TWO TIMES DAILY 05/08/21   Bedsole, Amy E, MD  omeprazole (PRILOSEC) 20 MG capsule Take 20 mg by mouth daily with supper. 06/11/22   [provider]  ondansetron (ZOFRAN) 4 MG tablet Take 1 tablet (4 mg total) by mouth every 8 (eight) hours as needed for nausea or vomiting. 02/12/22   Mansouraty, Telford Nab., MD  ONETOUCH VERIO test strip USE TO CHECK BLOOD SUGAR TWO TIMES A DAY 07/06/22   Bedsole, Amy E, MD  potassium chloride SA (KLOR-CON M) 20 MEQ tablet TAKE 1 TABLET BY MOUTH DAILY. Patient taking differently: Take 20 mEq by mouth daily with supper. 03/15/22   Bedsole, Amy E, MD  repaglinide (PRANDIN) 0.5 MG tablet Take 2 tablets (1 mg total)  by mouth daily before lunch. before breakfast and lunch Patient taking differently: Take 1 mg by mouth daily before lunch. 05/13/22   Shamleffer, Melanie Crazier, MD  UBRELVY 100 MG TABS TAKE 1 TABLET BY MOUTH AS NEEDED (MAY REPEAT DOSE AFTER 2 HOURS IF NEEDED. MAXIMUM 2 TABLETS IN 24 HOURS). 03/27/20   Pieter Partridge, DO  vitamin B-12 (CYANOCOBALAMIN) 1000 MCG tablet Take 1,000 mcg by mouth daily with breakfast. 08/15/20   [provider]  Vitamin D, Ergocalciferol, (DRISDOL) 1.25 MG (50000 UNIT) CAPS capsule TAKE 1 CAPSULE BY MOUTH ONCE A WEEK. 07/20/22   Bedsole, Amy E, MD      Allergies    Sudafed [pseudoephedrine hcl], Atenolol, Codeine, Epinephrine, Hydrocodone, Ibuprofen, Jardiance [empagliflozin], Oxycodone, Penicillins, Pentazocine lactate, Promethazine, Propranolol, Talwin [pentazocine], Tramadol, and Crestor [rosuvastatin]    Review of Systems   See HPI  Physical Exam Updated Vital Signs BP (!) 149/78 (BP Location: Left Arm)   Pulse (!) 130   Temp (!) 100.9 F (38.3 C) (Oral)   Resp 19   Wt 64.2 kg   SpO2 98%   BMI 26.74 kg/m  Physical Exam Vitals and nursing note reviewed.  Constitutional:      General: She is not in acute distress.    Appearance: She is well-developed.  HENT:     Head: Normocephalic and atraumatic.     Nose: Nose normal.     Mouth/Throat:     Mouth: Mucous membranes are dry.     Pharynx: Oropharynx is clear.  Eyes:     Extraocular Movements: Extraocular movements intact.     Conjunctiva/sclera: Conjunctivae normal.     Pupils: Pupils are equal, round, and reactive to light.  Cardiovascular:     Rate and Rhythm: Regular rhythm. Tachycardia present.     Heart sounds: No murmur heard. Pulmonary:     Effort: Pulmonary effort is normal. No respiratory distress.     Breath sounds: Normal breath sounds.  Abdominal:     General: Abdomen is flat. There is no distension.     Palpations: Abdomen is soft.     Tenderness: There is no abdominal  tenderness.  Musculoskeletal:        General: No swelling or tenderness.     Cervical back: Normal range of motion and neck supple. No rigidity or tenderness.  Lymphadenopathy:     Cervical: No cervical adenopathy.  Skin:    General: Skin is warm and dry.     Capillary Refill: Capillary refill takes less than 2 seconds.     Findings: Erythema (Area on medial left lower shin associated with warmth and swelling.) and rash (widespread rash at baseline d/t skin sensitivity) present.  Neurological:     Mental Status: She is alert.     Cranial Nerves: No cranial nerve deficit.     Sensory: No sensory deficit.     Motor: No weakness.  Comments: Aphasic   Psychiatric:        Mood and Affect: Mood normal.     ED Results / Procedures / Treatments   Labs (all labs ordered are listed, but only abnormal results are displayed) Labs Reviewed  I-STAT CHEM 8, ED - Abnormal; Notable for the following components:      Result Value   Sodium 133 (*)    BUN 7 (*)    Creatinine, Ser 1.10 (*)    Glucose, Bld 284 (*)    Calcium, Ion 1.01 (*)    All other components within normal limits  CBG MONITORING, ED - Abnormal; Notable for the following components:   Glucose-Capillary 285 (*)    All other components within normal limits  RESP PANEL BY RT-PCR (RSV, FLU A&B, COVID)  RVPGX2  PROTIME-INR  APTT  CBC  DIFFERENTIAL  COMPREHENSIVE METABOLIC PANEL  ETHANOL    EKG None  Radiology CT ANGIO HEAD NECK W WO CM (CODE STROKE)  Result Date: 08/16/2022 CLINICAL DATA:  Aphasia EXAM: CT ANGIOGRAPHY HEAD AND NECK TECHNIQUE: Multidetector CT imaging of the head and neck was performed using the standard protocol during bolus administration of intravenous contrast. Multiplanar CT image reconstructions and MIPs were obtained to evaluate the vascular anatomy. Carotid stenosis measurements (when applicable) are obtained utilizing NASCET criteria, using the distal internal carotid diameter as the  denominator. RADIATION DOSE REDUCTION: This exam was performed according to the departmental dose-optimization program which includes automated exposure control, adjustment of the mA and/or kV according to patient size and/or use of iterative reconstruction technique. CONTRAST:  62m OMNIPAQUE IOHEXOL 350 MG/ML SOLN COMPARISON:  No prior CTA available, correlation is made with 08/16/2022 CT head and 12/23/2020 MRA head FINDINGS: CT HEAD FINDINGS For noncontrast findings, please see same day CT head. CTA NECK FINDINGS Aortic arch: Standard branching. Imaged portion shows no evidence of aneurysm or dissection. No significant stenosis of the major arch vessel origins. Right carotid system: No evidence of dissection, occlusion, or hemodynamically significant stenosis (greater than 50%). Left carotid system: No evidence of dissection, occlusion, or hemodynamically significant stenosis (greater than 50%). Vertebral arteries: No evidence of dissection, occlusion, or hemodynamically significant stenosis (greater than 50%). Skeleton: No acute osseous abnormality. Degenerative changes in the cervical spine. Other neck: Hypoenhancing nodules in the thyroid, the largest of which measures up to 7 mm, for which no follow-up is indicated. (Reference: J Am Coll Radiol. 2015 Feb;12(2): 143-50) Upper chest: Negative. Review of the MIP images confirms the above findings CTA HEAD FINDINGS Anterior circulation: Both internal carotid arteries are patent to the termini, without significant stenosis. A1 segments patent. Normal anterior communicating artery. Anterior cerebral arteries are patent to their distal aspects. No M1 stenosis or occlusion. MCA branches perfused and symmetric. Posterior circulation: Vertebral arteries patent to the vertebrobasilar junction without stenosis. Basilar patent to its distal aspect. Superior cerebellar arteries patent proximally. Patent P1 segments. PCAs perfused to their distal aspects without stenosis.  The bilateral posterior communicating arteries are not visualized. Venous sinuses: As permitted by contrast timing, patent. Anatomic variants: None significant. Review of the MIP images confirms the above findings IMPRESSION: 1. No intracranial large vessel occlusion or significant stenosis. 2. No hemodynamically significant stenosis in the neck. These findings were discussed by telephone on 08/16/2022 at 7:20 pm with provider MCNEILL KIRKPATRICK . Electronically Signed   By: AMerilyn BabaM.D.   On: 08/16/2022 19:29   CT HEAD CODE STROKE WO CONTRAST  Result Date: 08/16/2022 CLINICAL DATA:  Code stroke.  Neuro deficit, acute, stroke suspected EXAM: CT HEAD WITHOUT CONTRAST TECHNIQUE: Contiguous axial images were obtained from the base of the skull through the vertex without intravenous contrast. RADIATION DOSE REDUCTION: This exam was performed according to the departmental dose-optimization program which includes automated exposure control, adjustment of the mA and/or kV according to patient size and/or use of iterative reconstruction technique. COMPARISON:  CT head Nov 02, 2021. FINDINGS: Brain: No evidence of acute infarction, hemorrhage, hydrocephalus, extra-axial collection or mass lesion/mass effect. Vascular: No hyperdense vessel. Skull: No acute fracture. Sinuses/Orbits: No acute finding. Other: No mastoid effusion. ASPECTS [facility] Stroke Program Early CT Score) total score (0-10 with 10 being normal): 10. IMPRESSION: 1. No evidence of acute abnormality. 2. ASPECTS is 10. Code stroke imaging results were communicated on 08/16/2022 at 7:13 pm to provider Dr. Leonel Ramsay via secure text paging. Electronically Signed   By: Margaretha Sheffield M.D.   On: 08/16/2022 19:13    Procedures Procedures  {Document cardiac monitor, telemetry assessment procedure when appropriate:1}  Medications Ordered in ED Medications  sodium chloride flush (NS) 0.9 % injection 3 mL (3 mLs Intravenous Not Given 08/16/22 1940)   acetaminophen (TYLENOL) tablet 650 mg (has no administration in time range)  metoCLOPramide (REGLAN) injection 10 mg (has no administration in time range)  iohexol (OMNIPAQUE) 350 MG/ML injection 75 mL (75 mLs Intravenous Contrast Given 08/16/22 1919)    ED Course/ Medical Decision Making/ A&P   {   Click here for ABCD2, HEART and other calculatorsREFRESH Note before signing :1}                          Medical Decision Making Amount and/or Complexity of Data Reviewed Labs: ordered. Radiology: ordered.  Risk OTC drugs.   ***  {Document critical care time when appropriate:1} {Document review of labs and clinical decision tools ie heart score, Chads2Vasc2 etc:1}  {Document your independent review of radiology images, and any outside records:1} {Document your discussion with family members, caretakers, and with consultants:1} {Document social determinants of health affecting pt's care:1} {Document your decision making why or why not admission, treatments were needed:1} Final Clinical Impression(s) / ED Diagnoses Final diagnoses:  None    Rx / DC Orders ED Discharge Orders     None

## 2022-08-16 NOTE — ED Notes (Signed)
Patient transported to MRI 

## 2022-08-16 NOTE — Progress Notes (Signed)
Pharmacy Antibiotic Note  Debra Leach is a 72 y.o. female admitted on 08/16/2022 with sepsis.  Pharmacy has been consulted for aztreonam and vancomycin dosing. Patient has tolerated cefazolin and cephalexin in the past. Pharmacy will adjust aztreonam to cefepime per protocol. SCr 1.1. CrCl 40.3 ml/min. WBC 12.8 and Tmax 100.9 F.   Plan: Vancomycin 1500 mg IV x1, followed by 750 mg q24h (eAUC 480) Cefepime 2 g IV q12h Continue metronidazole per MD Monitor renal function and for signs/symptoms of infection.  Weight: 64.2 kg (141 lb 8.6 oz)  Temp (24hrs), Avg:99.4 F (37.4 C), Min:98.5 F (36.9 C), Max:100.9 F (38.3 C)  Recent Labs  Lab 08/16/22 1213 08/16/22 1920 08/16/22 1932  WBC 8.6 12.8*  --   CREATININE 1.04 1.13* 1.10*  LATICACIDVEN  --  2.0*  --     Estimated Creatinine Clearance: 40.3 mL/min (A) (by C-G formula based on SCr of 1.1 mg/dL (H)).    Allergies  Allergen Reactions   Sudafed [Pseudoephedrine Hcl] Hives, Itching and Anxiety   Atenolol Other (See Comments)    Migraine   Codeine Itching and Swelling    REACTION: Migraine   Epinephrine Hives   Hydrocodone Other (See Comments)    Migraine   Ibuprofen Other (See Comments)    Migraine   Jardiance [Empagliflozin] Other (See Comments)    Caused uti Yeast infection   Oxycodone Other (See Comments)    Migraine   Penicillins Itching and Swelling    Has patient had a PCN reaction causing immediate rash, facial/tongue/throat swelling, SOB or lightheadedness with hypotension: No Has patient had a PCN reaction causing severe rash involving mucus membranes or skin necrosis: No Has patient had a PCN reaction that required hospitalization: No Has patient had a PCN reaction occurring within the last 10 years: No If all of the above answers are "NO", then may proceed with Cephalosporin use.    Pentazocine Lactate     REACTION: Swelling, itching, rash   Promethazine Other (See Comments)    migraine    Propranolol Other (See Comments)    Migraine   Talwin [Pentazocine] Other (See Comments)    Swelling and itching    Tramadol Other (See Comments)    Migraine   Crestor [Rosuvastatin] Other (See Comments)    Muscle pain    Antimicrobials this admission: Vancomycin 3/11 >>  Cefepime 3/11 >>   Dose adjustments this admission:  Microbiology results:  Thank you for allowing pharmacy to be a part of this patient's care.  Jeneen Rinks [phone] PM

## 2022-08-16 NOTE — ED Provider Notes (Signed)
[place] Provider Note   CSN: [id number] Arrival date & time: 08/16/22  1900  An emergency department physician performed an initial assessment on this suspected stroke patient at 1900.  History  Chief Complaint  Patient presents with   Code Stroke    Debra Leach is a 72 y.o. 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  She does have a history of speech difficulty with her migraines in the past, but husband states they have always been more like stuttering, and today she was able to speak worse with any respiratory concerns.  She denies any other symptoms of numbness or weakness in her extremities. She has had fever since Friday, highest 104 today.  She has had associated nausea and vomiting with decreased appetite over the past few days.  Denies dysuria or hematuria, but has been urinating less.  Last bowel movement at least 4 days ago. Denies chest pain, cough, abdominal pain, recent falls, hematuria, hematochezia.  HPI     Home Medications Prior to Admission medications   Medication Sig Start Date End Date Taking? Authorizing Provider  alum & mag hydroxide-simeth (MAALOX/MYLANTA) 200-200-20 MG/5ML suspension Take 30 mLs by mouth every 6 (six) hours as needed for indigestion or heartburn.    [provider]  atorvastatin (LIPITOR) 80 MG tablet Take 1 tablet (80 mg total) by mouth at bedtime. 11/03/21   Jennye Boroughs, MD  azaTHIOprine (IMURAN) 50 MG tablet Take 1 tablet (50 mg total) by mouth daily. 05/04/22   Mansouraty, Telford Nab., MD  Blood Glucose Monitoring Suppl (ONETOUCH VERIO REFLECT) w/Device KIT TEST TWICE A DAY 07/06/22   Bedsole, Amy E, MD  budesonide (ENTOCORT EC) 3 MG 24 hr capsule Take 2 capsules (6 mg total) by mouth  daily. 02/25/22   Mansouraty, Telford Nab., MD  Cholecalciferol (VITAMIN D3) 1.25 MG (50000 UT) CAPS Take 1 capsule by mouth once a week. 08/20/21   Bedsole, Amy E, MD  colesevelam (WELCHOL) 625 MG tablet TAKE 3 TABLETS BY MOUTH 2 TIMES DAILY WITH A MEAL. 08/31/21   Bedsole, Amy E, MD  Continuous Blood Gluc Sensor (DEXCOM G7 SENSOR) MISC 1 Device by Does not apply route as directed. 06/08/22   Shamleffer, Melanie Crazier, MD  ELIQUIS 2.5 MG TABS tablet TAKE 1 TABLET BY MOUTH 2 TIMES DAILY. 06/13/22   Bedsole, Amy E, MD  Eptinezumab-jjmr (VYEPTI) 100 MG/ML injection Inject 300 mg into the vein every 3 (three) months.    [provider]  ferrous gluconate (FERGON) 324 MG tablet Take 1 tablet (324 mg total) by mouth daily with breakfast. 02/15/22   Mansouraty, Telford Nab., MD  gabapentin (NEURONTIN) 100 MG capsule TAKE 1 CAPSULE BY MOUTH UP TO 3 TIMES DAILY AS NEEDED. 10/07/21   Tomi Likens, Adam R, DO  gabapentin (NEURONTIN) 300 MG capsule TAKE 2 CAPSULES BY MOUTH AT BEDTIME. 06/14/22   Tomi Likens, Adam R, DO  insulin glargine (LANTUS SOLOSTAR) 100 UNIT/ML Solostar Pen Inject 18 Units into the skin daily.    [provider]  Lancets (ONETOUCH DELICA PLUS [id number]) [place] USE TO CHECK BLOOD SUGAR TWO TIMES DAILY 05/08/21   Bedsole, Amy E, MD  omeprazole (PRILOSEC) 20 MG capsule Take 20 mg by mouth daily with supper. 06/11/22   [provider]  ondansetron (ZOFRAN) 4 MG tablet Take 1  tablet (4 mg total) by mouth every 8 (eight) hours as needed for nausea or vomiting. 02/12/22   Mansouraty, Telford Nab., MD  ONETOUCH VERIO test strip USE TO CHECK BLOOD SUGAR TWO TIMES A DAY 07/06/22   Bedsole, Amy E, MD  potassium chloride SA (KLOR-CON M) 20 MEQ tablet TAKE 1 TABLET BY MOUTH DAILY. Patient taking differently: Take 20 mEq by mouth daily with supper. 03/15/22   Bedsole, Amy E, MD  repaglinide (PRANDIN) 0.5 MG tablet Take 2 tablets (1 mg total) by mouth daily before lunch. before breakfast and lunch Patient  taking differently: Take 1 mg by mouth daily before lunch. 05/13/22   Shamleffer, Melanie Crazier, MD  UBRELVY 100 MG TABS TAKE 1 TABLET BY MOUTH AS NEEDED (MAY REPEAT DOSE AFTER 2 HOURS IF NEEDED. MAXIMUM 2 TABLETS IN 24 HOURS). 03/27/20   Pieter Partridge, DO  vitamin B-12 (CYANOCOBALAMIN) 1000 MCG tablet Take 1,000 mcg by mouth daily with breakfast. 08/15/20   [provider]  Vitamin D, Ergocalciferol, (DRISDOL) 1.25 MG (50000 UNIT) CAPS capsule TAKE 1 CAPSULE BY MOUTH ONCE A WEEK. 07/20/22   Bedsole, Amy E, MD      Allergies    Sudafed [pseudoephedrine hcl], Atenolol, Codeine, Epinephrine, Hydrocodone, Ibuprofen, Jardiance [empagliflozin], Oxycodone, Penicillins, Pentazocine lactate, Promethazine, Propranolol, Talwin [pentazocine], Tramadol, and Crestor [rosuvastatin]    Review of Systems   See HPI  Physical Exam Updated Vital Signs BP (!) 123/59   Pulse 92   Temp 98.8 F (37.1 C) (Oral)   Resp 19   Wt 64.2 kg   SpO2 96%   BMI 26.74 kg/m  Physical Exam Vitals and nursing note reviewed.  Constitutional:      General: She is not in acute distress.    Appearance: She is well-developed.  HENT:     Head: Normocephalic and atraumatic.     Nose: Nose normal.     Mouth/Throat:     Mouth: Mucous membranes are dry.     Pharynx: Oropharynx is clear.  Eyes:     Extraocular Movements: Extraocular movements intact.     Conjunctiva/sclera: Conjunctivae normal.     Pupils: Pupils are equal, round, and reactive to light.  Cardiovascular:     Rate and Rhythm: Regular rhythm. Tachycardia present.     Heart sounds: No murmur heard. Pulmonary:     Effort: Pulmonary effort is normal. No respiratory distress.     Breath sounds: Normal breath sounds.  Abdominal:     General: Abdomen is flat. There is no distension.     Palpations: Abdomen is soft.     Tenderness: There is no abdominal tenderness.  Musculoskeletal:        General: No swelling or tenderness.     Cervical back: Normal  range of motion and neck supple. No rigidity or tenderness.  Lymphadenopathy:     Cervical: No cervical adenopathy.  Skin:    General: Skin is warm and dry.     Capillary Refill: Capillary refill takes less than 2 seconds.     Findings: Erythema (Area on medial left lower shin associated with warmth and swelling.) and rash (widespread rash at baseline d/t skin sensitivity) present.  Neurological:     Mental Status: She is alert.     Cranial Nerves: No cranial nerve deficit.     Sensory: No sensory deficit.     Motor: No weakness.     Comments: Aphasic   Psychiatric:        Mood and Affect:  Mood normal.     ED Results / Procedures / Treatments   Labs (all labs ordered are listed, but only abnormal results are displayed) Labs Reviewed  PROTIME-INR - Abnormal; Notable for the following components:      Result Value   Prothrombin Time 15.9 (*)    INR 1.3 (*)    All other components within normal limits  CBC - Abnormal; Notable for the following components:   WBC 12.8 (*)    Hemoglobin 11.6 (*)    RDW 17.8 (*)    All other components within normal limits  DIFFERENTIAL - Abnormal; Notable for the following components:   Neutro Abs 11.6 (*)    Lymphs Abs 0.5 (*)    Abs Immature Granulocytes 0.08 (*)    All other components within normal limits  COMPREHENSIVE METABOLIC PANEL - Abnormal; Notable for the following components:   Sodium 132 (*)    Chloride 96 (*)    Glucose, Bld 293 (*)    BUN 7 (*)    Creatinine, Ser 1.13 (*)    Calcium 7.9 (*)    Total Protein 5.8 (*)    Albumin 2.3 (*)    GFR, Estimated 52 (*)    All other components within normal limits  LACTIC ACID, PLASMA - Abnormal; Notable for the following components:   Lactic Acid, Venous 2.0 (*)    All other components within normal limits  I-STAT CHEM 8, ED - Abnormal; Notable for the following components:   Sodium 133 (*)    BUN 7 (*)    Creatinine, Ser 1.10 (*)    Glucose, Bld 284 (*)    Calcium, Ion 1.01  (*)    All other components within normal limits  CBG MONITORING, ED - Abnormal; Notable for the following components:   Glucose-Capillary 285 (*)    All other components within normal limits  RESP PANEL BY RT-PCR (RSV, FLU A&B, COVID)  RVPGX2  URINE CULTURE  CULTURE, BLOOD (ROUTINE X 2)  CULTURE, BLOOD (ROUTINE X 2)  APTT  ETHANOL  LACTIC ACID, PLASMA  URINALYSIS, ROUTINE W REFLEX MICROSCOPIC    EKG EKG Interpretation  Date/Time:  Monday August 16 2022 19:33:49 EDT Ventricular Rate:  134 PR Interval:  160 QRS Duration: 126 QT Interval:  282 QTC Calculation: 421 R Axis:   -16 Text Interpretation: Sinus tachycardia Nonspecific intraventricular conduction delay Anterior infarct, old Minimal ST elevation, inferior leads Artifact in lead(s) I II III aVR aVL aVF V1 V2 V5 V6 Confirmed by Lacretia Leigh ([id number]) on 08/16/2022 8:10:47 PM  Radiology MR BRAIN WO CONTRAST  Result Date: 08/16/2022 CLINICAL DATA:  Speech difficulty, stroke suspected EXAM: MRI HEAD WITHOUT CONTRAST TECHNIQUE: Multiplanar, multiecho pulse sequences of the brain and surrounding structures were obtained without intravenous contrast. COMPARISON:  None Available. FINDINGS: Brain: No restricted diffusion to suggest acute or subacute infarct. No acute hemorrhage, mass, mass effect, or midline shift. No hydrocephalus or extra-axial collection. Normal craniocervical junction. No hemosiderin deposition to suggest remote hemorrhage. Vascular: Normal arterial flow voids. Skull and upper cervical spine: Normal marrow signal. Sinuses/Orbits: Clear paranasal sinuses. No acute finding in the orbits. Status post bilateral lens replacements. Other: The mastoid air cells are well aerated. IMPRESSION: No acute intracranial process. No evidence of acute or subacute infarct. Electronically Signed   By: Merilyn Baba M.D.   On: 08/16/2022 22:28   DG Chest Portable 1 View  Result Date: 08/16/2022 CLINICAL DATA:  Flu-like symptoms and fever  EXAM: PORTABLE CHEST 1  VIEW COMPARISON:  12/11/2021 FINDINGS: Elevated right hemidiaphragm with right basilar atelectasis. Additional left basilar atelectasis. No pleural effusion or pneumothorax. Stable cardiomediastinal silhouette. No acute osseous abnormality. IMPRESSION: No change from 12/11/2021. Elevated right hemidiaphragm with right basilar atelectasis. Electronically Signed   By: Placido Sou M.D.   On: 08/16/2022 20:25   CT ANGIO HEAD NECK W WO CM (CODE STROKE)  Result Date: 08/16/2022 CLINICAL DATA:  Aphasia EXAM: CT ANGIOGRAPHY HEAD AND NECK TECHNIQUE: Multidetector CT imaging of the head and neck was performed using the standard protocol during bolus administration of intravenous contrast. Multiplanar CT image reconstructions and MIPs were obtained to evaluate the vascular anatomy. Carotid stenosis measurements (when applicable) are obtained utilizing NASCET criteria, using the distal internal carotid diameter as the denominator. RADIATION DOSE REDUCTION: This exam was performed according to the departmental dose-optimization program which includes automated exposure control, adjustment of the mA and/or kV according to patient size and/or use of iterative reconstruction technique. CONTRAST:  30m OMNIPAQUE IOHEXOL 350 MG/ML SOLN COMPARISON:  No prior CTA available, correlation is made with 08/16/2022 CT head and 12/23/2020 MRA head FINDINGS: CT HEAD FINDINGS For noncontrast findings, please see same day CT head. CTA NECK FINDINGS Aortic arch: Standard branching. Imaged portion shows no evidence of aneurysm or dissection. No significant stenosis of the major arch vessel origins. Right carotid system: No evidence of dissection, occlusion, or hemodynamically significant stenosis (greater than 50%). Left carotid system: No evidence of dissection, occlusion, or hemodynamically significant stenosis (greater than 50%). Vertebral arteries: No evidence of dissection, occlusion, or hemodynamically  significant stenosis (greater than 50%). Skeleton: No acute osseous abnormality. Degenerative changes in the cervical spine. Other neck: Hypoenhancing nodules in the thyroid, the largest of which measures up to 7 mm, for which no follow-up is indicated. (Reference: J Am Coll Radiol. 2015 Feb;12(2): 143-50) Upper chest: Negative. Review of the MIP images confirms the above findings CTA HEAD FINDINGS Anterior circulation: Both internal carotid arteries are patent to the termini, without significant stenosis. A1 segments patent. Normal anterior communicating artery. Anterior cerebral arteries are patent to their distal aspects. No M1 stenosis or occlusion. MCA branches perfused and symmetric. Posterior circulation: Vertebral arteries patent to the vertebrobasilar junction without stenosis. Basilar patent to its distal aspect. Superior cerebellar arteries patent proximally. Patent P1 segments. PCAs perfused to their distal aspects without stenosis. The bilateral posterior communicating arteries are not visualized. Venous sinuses: As permitted by contrast timing, patent. Anatomic variants: None significant. Review of the MIP images confirms the above findings IMPRESSION: 1. No intracranial large vessel occlusion or significant stenosis. 2. No hemodynamically significant stenosis in the neck. These findings were discussed by telephone on 08/16/2022 at 7:20 pm with provider MCNEILL KIRKPATRICK . Electronically Signed   By: AMerilyn BabaM.D.   On: 08/16/2022 19:29   CT HEAD CODE STROKE WO CONTRAST  Result Date: 08/16/2022 CLINICAL DATA:  Code stroke.  Neuro deficit, acute, stroke suspected EXAM: CT HEAD WITHOUT CONTRAST TECHNIQUE: Contiguous axial images were obtained from the base of the skull through the vertex without intravenous contrast. RADIATION DOSE REDUCTION: This exam was performed according to the departmental dose-optimization program which includes automated exposure control, adjustment of the mA and/or kV  according to patient size and/or use of iterative reconstruction technique. COMPARISON:  CT head Nov 02, 2021. FINDINGS: Brain: No evidence of acute infarction, hemorrhage, hydrocephalus, extra-axial collection or mass lesion/mass effect. Vascular: No hyperdense vessel. Skull: No acute fracture. Sinuses/Orbits: No acute finding. Other: No mastoid effusion. ASPECTS (  [place] Stroke Program Early CT Score) total score (0-10 with 10 being normal): 10. IMPRESSION: 1. No evidence of acute abnormality. 2. ASPECTS is 10. Code stroke imaging results were communicated on 08/16/2022 at 7:13 pm to provider Dr. Leonel Ramsay via secure text paging. Electronically Signed   By: Margaretha Sheffield M.D.   On: 08/16/2022 19:13    Medications Ordered in ED Medications  sodium chloride flush (NS) 0.9 % injection 3 mL (3 mLs Intravenous Not Given 08/16/22 1940)  lactated ringers infusion ( Intravenous New Bag/Given 08/16/22 2323)  ceFEPIme (MAXIPIME) 2 g in sodium chloride 0.9 % 100 mL IVPB (0 g Intravenous Stopped 08/16/22 2347)  vancomycin (VANCOREADY) IVPB 1500 mg/300 mL (1,500 mg Intravenous New Bag/Given 08/16/22 2330)  vancomycin (VANCOREADY) IVPB 750 mg/150 mL (has no administration in time range)  iohexol (OMNIPAQUE) 350 MG/ML injection 75 mL (75 mLs Intravenous Contrast Given 08/16/22 1919)  acetaminophen (TYLENOL) tablet 650 mg (650 mg Oral Given 08/16/22 2003)  metoCLOPramide (REGLAN) injection 10 mg (10 mg Intravenous Given 08/16/22 2003)  lactated ringers bolus 1,000 mL (0 mLs Intravenous Stopped 08/16/22 2243)  lactated ringers bolus 1,000 mL (0 mLs Intravenous Stopped 08/16/22 2357)  metroNIDAZOLE (FLAGYL) IVPB 500 mg (0 mg Intravenous Stopped 08/16/22 2357)    ED Course/ Medical Decision Making/ A&P  Medical Decision Making Amount and/or Complexity of Data Reviewed Labs: ordered. Radiology: ordered.  Risk OTC drugs.   Patient presented initially as a code stroke with headache and aphasia noted.  Neurology  was at bedside to evaluate.  CTA, CT head and MRI brain were all negative for signs of ischemia at this time.  Patient was given Reglan with improvement of her headache.  She does have history of migraines and episode of speech difficulty in the past, but husband states he never presented like this.  She has also been febrile for the past few days with nausea vomiting and decreased appetite.  Concern for sepsis of unknown etiology with labs revealed slight leukocytosis with an elevated lactic acid at 2.  She also has slight AKI.  RVP negative for COVID and influenza.  She has blood culture and urine pending.  Patient was given 30 cc/kg of LR, initiated broad-spectrum antibiotics including vancomycin Flagyl and cefepime.  Patient will be admitted to the hospital for further care and treatment for sepsis of unknown etiology.  Upon reevaluation, patient's aphasia was improving, she continued to have no other focal neurofindings.  Final Clinical Impression(s) / ED Diagnoses Final diagnoses:  Sepsis, due to unspecified organism, unspecified whether acute organ dysfunction present [facility])     Bradd Canary, MD 08/17/22 [place]    Lacretia Leigh, MD 08/17/22 1658

## 2022-08-16 NOTE — ED Notes (Signed)
Date and time results received: 08/16/22 2048 (use smartphrase ".now" to insert current time)  Test: Lactic Acid Critical Value: 2.0  Name of Provider Notified: Zenia Resides, MD  Orders Received? Or Actions Taken?:  notified MD

## 2022-08-17 ENCOUNTER — Encounter: Payer: Self-pay | Admitting: Family Medicine

## 2022-08-17 LAB — COMPREHENSIVE METABOLIC PANEL
ALT: 17 U/L (ref 0–44)
AST: 23 U/L (ref 15–41)
Albumin: 2 g/dL — ABNORMAL LOW (ref 3.5–5.0)
Alkaline Phosphatase: 59 U/L (ref 38–126)
Anion gap: 10 (ref 5–15)
BUN: 7 mg/dL — ABNORMAL LOW (ref 8–23)
CO2: 22 mmol/L (ref 22–32)
Calcium: 7.7 mg/dL — ABNORMAL LOW (ref 8.9–10.3)
Chloride: 104 mmol/L (ref 98–111)
Creatinine, Ser: 1.03 mg/dL — ABNORMAL HIGH (ref 0.44–1.00)
GFR, Estimated: 58 mL/min — ABNORMAL LOW (ref >60)
Glucose, Bld: 217 mg/dL — ABNORMAL HIGH (ref 70–99)
Potassium: 3.4 mmol/L — ABNORMAL LOW (ref 3.5–5.1)
Sodium: 136 mmol/L (ref 135–145)
Total Bilirubin: 0.5 mg/dL (ref 0.3–1.2)
Total Protein: 5.2 g/dL — ABNORMAL LOW (ref 6.5–8.1)

## 2022-08-17 LAB — URINALYSIS, ROUTINE W REFLEX MICROSCOPIC
Bilirubin Urine: NEGATIVE
Glucose, UA: NEGATIVE mg/dL
Ketones, ur: 5 mg/dL — AB
Nitrite: NEGATIVE
Protein, ur: NEGATIVE mg/dL
Specific Gravity, Urine: 1.024 (ref 1.005–1.030)
WBC, UA: >50 WBC/hpf (ref 0–5)
pH: 6 (ref 5.0–8.0)

## 2022-08-17 LAB — CBC
HCT: 33.1 % — ABNORMAL LOW (ref 36.0–46.0)
Hemoglobin: 10.7 g/dL — ABNORMAL LOW (ref 12.0–15.0)
MCH: 28 pg (ref 26.0–34.0)
MCHC: 32.3 g/dL (ref 30.0–36.0)
MCV: 86.6 fL (ref 80.0–100.0)
Platelets: 213 10*3/uL (ref 150–400)
RBC: 3.82 MIL/uL — ABNORMAL LOW (ref 3.87–5.11)
RDW: 18.2 % — ABNORMAL HIGH (ref 11.5–15.5)
WBC: 11.2 10*3/uL — ABNORMAL HIGH (ref 4.0–10.5)
nRBC: 0 % (ref 0.0–0.2)

## 2022-08-17 LAB — MRSA NEXT GEN BY PCR, NASAL: MRSA by PCR Next Gen: NOT DETECTED

## 2022-08-17 LAB — GLUCOSE, CAPILLARY
Glucose-Capillary: 140 mg/dL — ABNORMAL HIGH (ref 70–99)
Glucose-Capillary: 147 mg/dL — ABNORMAL HIGH (ref 70–99)
Glucose-Capillary: 103 mg/dL — ABNORMAL HIGH (ref 70–99)

## 2022-08-17 LAB — CBG MONITORING, ED
Glucose-Capillary: 220 mg/dL — ABNORMAL HIGH (ref 70–99)
Glucose-Capillary: 157 mg/dL — ABNORMAL HIGH (ref 70–99)

## 2022-08-17 LAB — PHOSPHORUS: Phosphorus: 3.7 mg/dL (ref 2.5–4.6)

## 2022-08-17 LAB — MAGNESIUM: Magnesium: 1.6 mg/dL — ABNORMAL LOW (ref 1.7–2.4)

## 2022-08-17 MED ORDER — TRAZODONE HCL 50 MG PO TABS: 50.0000 mg | ORAL_TABLET | Freq: Every evening | ORAL | Status: DC | PRN

## 2022-08-17 MED ORDER — COLESEVELAM HCL 625 MG PO TABS
1875.0000 mg | ORAL_TABLET | Freq: Two times a day (BID) | ORAL | Status: DC
  Filled 2022-08-17 (×4): qty 3

## 2022-08-17 MED ORDER — APIXABAN 2.5 MG PO TABS
2.5000 mg | ORAL_TABLET | Freq: Two times a day (BID) | ORAL | Status: DC
  Administered 2022-08-17 – 2022-08-18 (×3): 2.5 mg via ORAL
  Filled 2022-08-17 (×3): qty 1

## 2022-08-17 MED ORDER — GUAIFENESIN 100 MG/5ML PO LIQD: 5.0000 mL | ORAL | Status: DC | PRN

## 2022-08-17 MED ORDER — INSULIN ASPART 100 UNIT/ML IJ SOLN
0.0000 [IU] | Freq: Every day | INTRAMUSCULAR | Status: DC
  Administered 2022-08-17: 2 [IU] via SUBCUTANEOUS

## 2022-08-17 MED ORDER — LACTATED RINGERS IV SOLN: INTRAVENOUS | Status: DC

## 2022-08-17 MED ORDER — POTASSIUM CHLORIDE CRYS ER 20 MEQ PO TBCR
40.0000 meq | EXTENDED_RELEASE_TABLET | Freq: Once | ORAL | Status: AC
  Administered 2022-08-17: 40 meq via ORAL
  Filled 2022-08-17: qty 2

## 2022-08-17 MED ORDER — PANTOPRAZOLE SODIUM 40 MG PO TBEC
40.0000 mg | DELAYED_RELEASE_TABLET | Freq: Every day | ORAL | Status: DC
  Administered 2022-08-17 – 2022-08-18 (×2): 40 mg via ORAL
  Filled 2022-08-17 (×3): qty 1

## 2022-08-17 MED ORDER — ATORVASTATIN CALCIUM 80 MG PO TABS
80.0000 mg | ORAL_TABLET | Freq: Every day | ORAL | Status: DC
  Administered 2022-08-17 (×2): 80 mg via ORAL
  Filled 2022-08-17: qty 2
  Filled 2022-08-17: qty 1

## 2022-08-17 MED ORDER — HYDRALAZINE HCL 20 MG/ML IJ SOLN: 10.0000 mg | INTRAMUSCULAR | Status: DC | PRN

## 2022-08-17 MED ORDER — SENNOSIDES-DOCUSATE SODIUM 8.6-50 MG PO TABS: 1.0000 | ORAL_TABLET | Freq: Every evening | ORAL | Status: DC | PRN

## 2022-08-17 MED ORDER — IPRATROPIUM-ALBUTEROL 0.5-2.5 (3) MG/3ML IN SOLN: 3.0000 mL | RESPIRATORY_TRACT | Status: DC | PRN

## 2022-08-17 MED ORDER — SODIUM CHLORIDE 0.9 % IV SOLN
1.0000 g | INTRAVENOUS | Status: DC
  Administered 2022-08-17 – 2022-08-18 (×2): 1 g via INTRAVENOUS
  Filled 2022-08-17 (×2): qty 10

## 2022-08-17 MED ORDER — FERROUS GLUCONATE 324 (38 FE) MG PO TABS
324.0000 mg | ORAL_TABLET | Freq: Every day | ORAL | Status: DC
  Filled 2022-08-17 (×3): qty 1

## 2022-08-17 MED ORDER — PROCHLORPERAZINE EDISYLATE 10 MG/2ML IJ SOLN: 5.0000 mg | Freq: Four times a day (QID) | INTRAMUSCULAR | Status: DC | PRN

## 2022-08-17 MED ORDER — INSULIN GLARGINE-YFGN 100 UNIT/ML ~~LOC~~ SOLN
10.0000 [IU] | Freq: Every day | SUBCUTANEOUS | Status: DC
  Administered 2022-08-17 – 2022-08-18 (×2): 10 [IU] via SUBCUTANEOUS
  Filled 2022-08-17 (×2): qty 0.1

## 2022-08-17 MED ORDER — ACETAMINOPHEN 325 MG PO TABS
650.0000 mg | ORAL_TABLET | Freq: Four times a day (QID) | ORAL | Status: DC | PRN
  Administered 2022-08-17 – 2022-08-18 (×4): 650 mg via ORAL
  Filled 2022-08-17 (×5): qty 2

## 2022-08-17 MED ORDER — ONDANSETRON HCL 4 MG/2ML IJ SOLN
4.0000 mg | Freq: Four times a day (QID) | INTRAMUSCULAR | Status: DC | PRN
  Administered 2022-08-17 – 2022-08-18 (×2): 4 mg via INTRAVENOUS
  Filled 2022-08-17 (×2): qty 2

## 2022-08-17 MED ORDER — GABAPENTIN 300 MG PO CAPS
300.0000 mg | ORAL_CAPSULE | Freq: Once | ORAL | Status: AC
  Administered 2022-08-17: 300 mg via ORAL
  Filled 2022-08-17: qty 1

## 2022-08-17 MED ORDER — MELATONIN 5 MG PO TABS: 5.0000 mg | ORAL_TABLET | Freq: Every evening | ORAL | Status: DC | PRN

## 2022-08-17 MED ORDER — GABAPENTIN 300 MG PO CAPS
300.0000 mg | ORAL_CAPSULE | Freq: Every day | ORAL | Status: DC
  Administered 2022-08-17 (×2): 300 mg via ORAL
  Filled 2022-08-17 (×2): qty 1

## 2022-08-17 MED ORDER — INSULIN ASPART 100 UNIT/ML IJ SOLN
0.0000 [IU] | Freq: Three times a day (TID) | INTRAMUSCULAR | Status: DC
  Administered 2022-08-17: 2 [IU] via SUBCUTANEOUS
  Administered 2022-08-17 (×2): 1 [IU] via SUBCUTANEOUS

## 2022-08-17 MED ORDER — VITAMIN B-12 1000 MCG PO TABS
1000.0000 ug | ORAL_TABLET | Freq: Every day | ORAL | Status: DC
  Administered 2022-08-17 – 2022-08-18 (×2): 1000 ug via ORAL
  Filled 2022-08-17 (×2): qty 1

## 2022-08-17 MED ORDER — METOPROLOL TARTRATE 5 MG/5ML IV SOLN: 5.0000 mg | INTRAVENOUS | Status: DC | PRN

## 2022-08-17 MED ORDER — POLYETHYLENE GLYCOL 3350 17 G PO PACK: 17.0000 g | PACK | Freq: Every day | ORAL | Status: DC | PRN

## 2022-08-17 MED ORDER — MAGNESIUM SULFATE 2 GM/50ML IV SOLN
2.0000 g | Freq: Once | INTRAVENOUS | Status: AC
  Administered 2022-08-17: 2 g via INTRAVENOUS
  Filled 2022-08-17: qty 50

## 2022-08-17 NOTE — H&P (Addendum)
History and Physical  Debra Leach [id number] DOB: 01/15/1951 DOA: 08/16/2022  Referring physician: Dr. Jeannine Kitten, [place]  PCP: [name], MD  Outpatient Specialists: GI. Patient coming from: Home.  Chief Complaint: Speech difficulty, fever for the past 3 days.  HPI: Debra Leach is a 72 y.o. 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  Also reports having intermittent fevers for the past 3 days.  Associated with nausea and vomiting and decreased oral intake.  Today around 3 PM her speech Was.  EMS was called and code stroke was activated.  In the ED, MRI was negative for stroke.  Noted to be febrile with leukocytosis, pyuria on urine analysis.  Code sepsis was called in the ED.  Cultures obtained, started on IV fluid per sepsis protocol and empiric IV antibiotics.  Admitted by [facility], hospitalist service.  ED Course: Tmax 100.9.  BP 121/58, pulse 77, respiration rate 15, O2 saturation 99% on room air.  UA positive for pyuria.  Lab studies remarkable for serum potassium 3.4, glucose 217, creatinine 1.03 and GFR 58.  Magnesium 1.6.  Albumin 2.0.  WBC 12.8, hemoglobin 11.6.  Review of Systems: Review of systems as noted in the HPI. All other systems reviewed and are negative.   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HCC)    Clotting disorder (HCC)    Complication of anesthesia    difficult waking   Diabetes mellitus without complication (HCC)    Diet and exercise controlled   Diverticulosis    DVT (deep venous thrombosis) ([place]) 02/2019   3calf 2 right lung 1 left lung   DVT  (deep venous thrombosis) ([place]) 2020   right leg   Dyspnea    GERD (gastroesophageal reflux disease)    Headache    Hepatic steatosis    Hiatal hernia    History of kidney stones    Hyperlipidemia    Hypertension    06/29/22 has not had to take blood pressure meds in over 1 year.   MVP (mitral valve prolapse)    history of   PONV (postoperative nausea and vomiting)    Pulmonary emboli ([place]) 03/01/2019   2 in the right; 1 in the left   Past Surgical History:  Procedure Laterality Date   arm surgery  2012   left forearm fracture w/ plates pins screws   ARTERY BIOPSY Right 12/03/2021   Procedure: BIOPSY TEMPORAL ARTERY;  Surgeon: Algernon Huxley, MD;  Location: [facility] ORS;  Service: Vascular;  Laterality: Right;  Right temple   BREAST BIOPSY Left    BREAST SURGERY  2000   breast biopsy (benign)   CATARACT EXTRACTION Right 2005   CATARACT EXTRACTION Left 2010   LITHOTRIPSY  08/12/2008   stent placed bilaterally   PANCREAS BIOPSY  2021   PANCREAS BIOPSY  2019   PARTIAL HYSTERECTOMY     Both ovaries remain, vaginal, for mennorhagia   REPAIR EXTENSOR TENDON Right 06/30/2022   Procedure: Middle and ring finger sagittal band reconstruction;  Surgeon: Sherilyn Cooter, MD;  Location: [place];  Service: Orthopedics;  Laterality: Right;   TONSILLECTOMY     TUBAL LIGATION  1980    Social History:  reports that she quit smoking about 31 years ago. Her smoking use included cigarettes. She has a 13.00 pack-year smoking history. She has never been exposed to tobacco smoke. She has never used smokeless tobacco. She reports that she does not drink alcohol and does not use drugs.   Allergies  Allergen Reactions   Sudafed [Pseudoephedrine Hcl] Hives, Itching and Anxiety   Atenolol Other (See Comments)    Migraine   Codeine Itching and Swelling    REACTION: Migraine   Epinephrine Hives   Hydrocodone Other (See Comments)    Migraine   Ibuprofen Other (See Comments)    Migraine   Jardiance  [Empagliflozin] Other (See Comments)    Caused uti Yeast infection   Oxycodone Other (See Comments)    Migraine   Penicillins Itching and Swelling    Has patient had a PCN reaction causing immediate rash, facial/tongue/throat swelling, SOB or lightheadedness with hypotension: No Has patient had a PCN reaction causing severe rash involving mucus membranes or skin necrosis: No Has patient had a PCN reaction that required hospitalization: No Has patient had a PCN reaction occurring within the last 10 years: No If all of the above answers are "NO", then may proceed with Cephalosporin use.    Pentazocine Lactate     REACTION: Swelling, itching, rash   Promethazine Other (See Comments)    migraine   Propranolol Other (See Comments)    Migraine   Talwin [Pentazocine] Other (See Comments)    Swelling and itching    Tramadol Other (See Comments)    Migraine   Crestor [Rosuvastatin] Other (See Comments)    Muscle pain    Family History  Problem Relation Age of Onset   Cancer Mother        bone   Hypertension Father    Mitral valve prolapse Father    Asthma Brother    Arthritis Brother    Nephrolithiasis Brother    Asthma Brother    COPD Brother    Breast cancer Maternal Aunt    Breast cancer Maternal Aunt    Colon cancer Neg Hx    Esophageal cancer Neg Hx    Inflammatory bowel disease Neg Hx    Liver disease Neg Hx    Pancreatic cancer Neg Hx    Rectal cancer Neg Hx    Stomach cancer Neg Hx       Prior to Admission medications   Medication Sig Start Date End Date Taking? Authorizing Provider  alum & mag hydroxide-simeth (MAALOX/MYLANTA) 200-200-20 MG/5ML suspension Take 30 mLs by mouth every 6 (six) hours as needed for indigestion or heartburn.    [provider]  atorvastatin (LIPITOR) 80 MG tablet Take 1 tablet (80 mg total) by mouth at bedtime. 11/03/21   Jennye Boroughs, MD  azaTHIOprine (IMURAN) 50 MG tablet Take 1 tablet (50 mg total) by mouth daily. 05/04/22    Mansouraty, Telford Nab., MD  Blood Glucose Monitoring Suppl (ONETOUCH VERIO REFLECT) w/Device KIT TEST TWICE A DAY 07/06/22   Bedsole, Amy E, MD  budesonide (ENTOCORT EC) 3 MG 24 hr capsule Take 2 capsules (6 mg total) by mouth daily. 02/25/22   Mansouraty, Telford Nab., MD  Cholecalciferol (VITAMIN D3) 1.25 MG (50000 UT) CAPS Take 1 capsule by mouth once a week. 08/20/21   Bedsole, Amy E, MD  colesevelam (WELCHOL) 625 MG tablet TAKE 3 TABLETS BY MOUTH 2 TIMES DAILY WITH A MEAL. 08/31/21   Bedsole, Amy E,  MD  Continuous Blood Gluc Sensor (DEXCOM G7 SENSOR) MISC 1 Device by Does not apply route as directed. 06/08/22   Shamleffer, Melanie Crazier, MD  ELIQUIS 2.5 MG TABS tablet TAKE 1 TABLET BY MOUTH 2 TIMES DAILY. 06/13/22   Bedsole, Amy E, MD  Eptinezumab-jjmr (VYEPTI) 100 MG/ML injection Inject 300 mg into the vein every 3 (three) months.    [provider]  ferrous gluconate (FERGON) 324 MG tablet Take 1 tablet (324 mg total) by mouth daily with breakfast. 02/15/22   Mansouraty, Telford Nab., MD  gabapentin (NEURONTIN) 100 MG capsule TAKE 1 CAPSULE BY MOUTH UP TO 3 TIMES DAILY AS NEEDED. 10/07/21   Tomi Likens, Adam R, DO  gabapentin (NEURONTIN) 300 MG capsule TAKE 2 CAPSULES BY MOUTH AT BEDTIME. 06/14/22   Tomi Likens, Adam R, DO  insulin glargine (LANTUS SOLOSTAR) 100 UNIT/ML Solostar Pen Inject 18 Units into the skin daily.    [provider]  Lancets (ONETOUCH DELICA PLUS Q000111Q) [place] USE TO CHECK BLOOD SUGAR TWO TIMES DAILY 05/08/21   Bedsole, Amy E, MD  omeprazole (PRILOSEC) 20 MG capsule Take 20 mg by mouth daily with supper. 06/11/22   [provider]  ondansetron (ZOFRAN) 4 MG tablet Take 1 tablet (4 mg total) by mouth every 8 (eight) hours as needed for nausea or vomiting. 02/12/22   Mansouraty, Telford Nab., MD  ONETOUCH VERIO test strip USE TO CHECK BLOOD SUGAR TWO TIMES A DAY 07/06/22   Bedsole, Amy E, MD  potassium chloride SA (KLOR-CON M) 20 MEQ tablet TAKE 1 TABLET BY MOUTH  DAILY. Patient taking differently: Take 20 mEq by mouth daily with supper. 03/15/22   Bedsole, Amy E, MD  repaglinide (PRANDIN) 0.5 MG tablet Take 2 tablets (1 mg total) by mouth daily before lunch. before breakfast and lunch Patient taking differently: Take 1 mg by mouth daily before lunch. 05/13/22   Shamleffer, Melanie Crazier, MD  UBRELVY 100 MG TABS TAKE 1 TABLET BY MOUTH AS NEEDED (MAY REPEAT DOSE AFTER 2 HOURS IF NEEDED. MAXIMUM 2 TABLETS IN 24 HOURS). 03/27/20   Pieter Partridge, DO  vitamin B-12 (CYANOCOBALAMIN) 1000 MCG tablet Take 1,000 mcg by mouth daily with breakfast. 08/15/20   [provider]  Vitamin D, Ergocalciferol, (DRISDOL) 1.25 MG (50000 UNIT) CAPS capsule TAKE 1 CAPSULE BY MOUTH ONCE A WEEK. 07/20/22   Diona Browner, Amy E, MD    Physical Exam: BP (!) 123/59   Pulse 92   Temp 98.8 F (37.1 C) (Oral)   Resp 19   Wt 64.2 kg   SpO2 96%   BMI 26.74 kg/m   General: 72 y.o. year-old female well developed well nourished in no acute distress.  Alert and oriented x3. Cardiovascular: Regular rate and rhythm with no rubs or gallops.  No thyromegaly or JVD noted.  No lower extremity edema. 2/4 pulses in all 4 extremities. Respiratory: Clear to auscultation with no wheezes or rales. Good inspiratory effort. Abdomen: Soft nontender nondistended with normal bowel sounds x4 quadrants. Muskuloskeletal: No cyanosis, clubbing or edema noted bilaterally Neuro: CN II-XII intact, strength, sensation, reflexes Skin: Right lower extremity erythematous, warm, tender to touch. Psychiatry: Judgement and insight appear normal. Mood is appropriate for condition and setting          Labs on Admission:  Basic Metabolic Panel: Recent Labs  Lab 08/16/22 1213 08/16/22 1920 08/16/22 1932  NA 137 132* 133*  K 3.4* 4.0 4.1  CL 97 96* 98  CO2 28 24  --  GLUCOSE 289* 293* 284*  BUN 10 7* 7*  CREATININE 1.04 1.13* 1.10*  CALCIUM 9.2 7.9*  --    Liver Function Tests: Recent Labs  Lab  08/16/22 1213 08/16/22 1920  AST 17 24  ALT 17 19  ALKPHOS 81 69  BILITOT 0.4 0.6  PROT 6.4 5.8*  ALBUMIN 3.3* 2.3*   No results for input(s): "LIPASE", "AMYLASE" in the last 168 hours. No results for input(s): "AMMONIA" in the last 168 hours. CBC: Recent Labs  Lab 08/16/22 1213 08/16/22 1920 08/16/22 1932  WBC 8.6 12.8*  --   NEUTROABS 6.8 11.6*  --   HGB 13.3 11.6* 12.2  HCT 40.7 36.3 36.0  MCV 86.2 87.5  --   PLT 289.0 224  --    Cardiac Enzymes: No results for input(s): "CKTOTAL", "CKMB", "CKMBINDEX", "TROPONINI" in the last 168 hours.  BNP (last 3 results) No results for input(s): "BNP" in the last 8760 hours.  ProBNP (last 3 results) No results for input(s): "PROBNP" in the last 8760 hours.  CBG: Recent Labs  Lab 08/16/22 1903  GLUCAP 285*    Radiological Exams on Admission: MR BRAIN WO CONTRAST  Result Date: 08/16/2022 CLINICAL DATA:  Speech difficulty, stroke suspected EXAM: MRI HEAD WITHOUT CONTRAST TECHNIQUE: Multiplanar, multiecho pulse sequences of the brain and surrounding structures were obtained without intravenous contrast. COMPARISON:  None Available. FINDINGS: Brain: No restricted diffusion to suggest acute or subacute infarct. No acute hemorrhage, mass, mass effect, or midline shift. No hydrocephalus or extra-axial collection. Normal craniocervical junction. No hemosiderin deposition to suggest remote hemorrhage. Vascular: Normal arterial flow voids. Skull and upper cervical spine: Normal marrow signal. Sinuses/Orbits: Clear paranasal sinuses. No acute finding in the orbits. Status post bilateral lens replacements. Other: The mastoid air cells are well aerated. IMPRESSION: No acute intracranial process. No evidence of acute or subacute infarct. Electronically Signed   By: Merilyn Baba M.D.   On: 08/16/2022 22:28   DG Chest Portable 1 View  Result Date: 08/16/2022 CLINICAL DATA:  Flu-like symptoms and fever EXAM: PORTABLE CHEST 1 VIEW COMPARISON:   12/11/2021 FINDINGS: Elevated right hemidiaphragm with right basilar atelectasis. Additional left basilar atelectasis. No pleural effusion or pneumothorax. Stable cardiomediastinal silhouette. No acute osseous abnormality. IMPRESSION: No change from 12/11/2021. Elevated right hemidiaphragm with right basilar atelectasis. Electronically Signed   By: Placido Sou M.D.   On: 08/16/2022 20:25   CT ANGIO HEAD NECK W WO CM (CODE STROKE)  Result Date: 08/16/2022 CLINICAL DATA:  Aphasia EXAM: CT ANGIOGRAPHY HEAD AND NECK TECHNIQUE: Multidetector CT imaging of the head and neck was performed using the standard protocol during bolus administration of intravenous contrast. Multiplanar CT image reconstructions and MIPs were obtained to evaluate the vascular anatomy. Carotid stenosis measurements (when applicable) are obtained utilizing NASCET criteria, using the distal internal carotid diameter as the denominator. RADIATION DOSE REDUCTION: This exam was performed according to the departmental dose-optimization program which includes automated exposure control, adjustment of the mA and/or kV according to patient size and/or use of iterative reconstruction technique. CONTRAST:  5m OMNIPAQUE IOHEXOL 350 MG/ML SOLN COMPARISON:  No prior CTA available, correlation is made with 08/16/2022 CT head and 12/23/2020 MRA head FINDINGS: CT HEAD FINDINGS For noncontrast findings, please see same day CT head. CTA NECK FINDINGS Aortic arch: Standard branching. Imaged portion shows no evidence of aneurysm or dissection. No significant stenosis of the major arch vessel origins. Right carotid system: No evidence of dissection, occlusion, or hemodynamically significant stenosis (greater than 50%).  Left carotid system: No evidence of dissection, occlusion, or hemodynamically significant stenosis (greater than 50%). Vertebral arteries: No evidence of dissection, occlusion, or hemodynamically significant stenosis (greater than 50%).  Skeleton: No acute osseous abnormality. Degenerative changes in the cervical spine. Other neck: Hypoenhancing nodules in the thyroid, the largest of which measures up to 7 mm, for which no follow-up is indicated. (Reference: J Am Coll Radiol. 2015 Feb;12(2): 143-50) Upper chest: Negative. Review of the MIP images confirms the above findings CTA HEAD FINDINGS Anterior circulation: Both internal carotid arteries are patent to the termini, without significant stenosis. A1 segments patent. Normal anterior communicating artery. Anterior cerebral arteries are patent to their distal aspects. No M1 stenosis or occlusion. MCA branches perfused and symmetric. Posterior circulation: Vertebral arteries patent to the vertebrobasilar junction without stenosis. Basilar patent to its distal aspect. Superior cerebellar arteries patent proximally. Patent P1 segments. PCAs perfused to their distal aspects without stenosis. The bilateral posterior communicating arteries are not visualized. Venous sinuses: As permitted by contrast timing, patent. Anatomic variants: None significant. Review of the MIP images confirms the above findings IMPRESSION: 1. No intracranial large vessel occlusion or significant stenosis. 2. No hemodynamically significant stenosis in the neck. These findings were discussed by telephone on 08/16/2022 at 7:20 pm with provider MCNEILL KIRKPATRICK . Electronically Signed   By: Merilyn Baba M.D.   On: 08/16/2022 19:29   CT HEAD CODE STROKE WO CONTRAST  Result Date: 08/16/2022 CLINICAL DATA:  Code stroke.  Neuro deficit, acute, stroke suspected EXAM: CT HEAD WITHOUT CONTRAST TECHNIQUE: Contiguous axial images were obtained from the base of the skull through the vertex without intravenous contrast. RADIATION DOSE REDUCTION: This exam was performed according to the departmental dose-optimization program which includes automated exposure control, adjustment of the mA and/or kV according to patient size and/or use of  iterative reconstruction technique. COMPARISON:  CT head Nov 02, 2021. FINDINGS: Brain: No evidence of acute infarction, hemorrhage, hydrocephalus, extra-axial collection or mass lesion/mass effect. Vascular: No hyperdense vessel. Skull: No acute fracture. Sinuses/Orbits: No acute finding. Other: No mastoid effusion. ASPECTS [facility] Stroke Program Early CT Score) total score (0-10 with 10 being normal): 10. IMPRESSION: 1. No evidence of acute abnormality. 2. ASPECTS is 10. Code stroke imaging results were communicated on 08/16/2022 at 7:13 pm to provider Dr. Leonel Ramsay via secure text paging. Electronically Signed   By: Margaretha Sheffield M.D.   On: 08/16/2022 19:13    EKG: I independently viewed the EKG done and my findings are as followed: Sinus tachycardia rate of 134.  Nonspecific ST-T changes.  QTc 421  Assessment/Plan Present on Admission:  Sepsis ([place])  Principal Problem:   Sepsis ([place])  Sepsis secondary to UTI, possible left lower extremity cellulitis noted on exam, POA Immunocompromised on immunosuppressants Fever of 100.8, leukocytosis 12.8, tachycardia 134, UA positive for pyuria. Follow urine culture and blood cultures for ID and sensitivities Continue cefepime started in the ED Also received IV vancomycin and IV Flagyl in the ED. IV Flagyl was not continued. Gentle IV fluid hydration while on IV vancomycin, follow MRSA screening test. Monitor fever curve and WBC  History of autoimmune pancreatitis on home immunosuppressants On budesonide and azathioprine prior to admission Hold off immunosuppressants for now Consult GI in the morning for recommendations.  History of right lower extremity DVT Resume home Eliquis  Hypokalemia Hypomagnesemia Repleted  History of polyneuropathy Resume home gabapentin  GERD Resume home PPI  Hyperlipidemia Resume home Lipitor  CKD 3A At baseline   DVT prophylaxis:  Home Eliquis  Code Status: Full code as stated by the patient  herself.  Family Communication: Updated her husband at bedside.  Disposition Plan: Admitted to progressive care unit.   Consults called: Neurology.  Admission status: Inpatient status with   Status is: Inpatient The patient requires at least 2 midnights for further evaluation and treatment of present condition.   Kayleen Memos MD [facility] Pager [phone]  If 7PM-7AM, please contact night-coverage [email or web address] Password [facility]  08/17/2022, 12:22 AM

## 2022-08-17 NOTE — Progress Notes (Signed)
No critical labs need to be addressed urgently. We will discuss labs in detail at upcoming office visit.   

## 2022-08-17 NOTE — Progress Notes (Signed)
Admitted this morning by Dr. Nevada Crane  72 year old with history of right lower extremity DVT on Eliquis, autoimmune pancreatitis on budesonide/azathioprine, DM2, HTN, HLD, migraine with speech difficulty and or admitted for speech difficulty started on a few days ago and worsened on the day of admission associated with nausea and vomiting.  MRI of the brain in the ER was negative.  Patient was seen by neurology.  Also diagnosed with sepsis secondary to urinary tract infection and possible left lower extremity cellulitis.  Seen and examined at bedside, does not have any complaints.  Tells me she is feeling better compared to yesterday evening.  On physical exam she is clear to auscultation bilaterally.  Very minimal bibasilar rhonchi.  Left lower extremity small patch of erythema and warmth with tender to touch closer to her left ankle but appears to be superficial  Assessment and plan:  Sepsis secondary to UTI/left lower leg cellulitis-sepsis physiology improving.  Follow culture data.  MRSA swab negative, chest x-ray clear.  Will change antibiotics to IV Rocephin.  Discontinue vancomycin and cefepime  History of autoimmune pancreatitis-immunosuppressants on hold while septic, slowly resume home budesonide and azathioprine  Diabetes mellitus type 2, insulin-dependent-sliding scale Accu-Chek, long-acting insulin.  History of right lower extremity DVT-on Eliquis  Other stable medical conditions: Polyneuropathy GERD Hyperlipidemia CKD stage IIIa  Transfer patient to MedSurg  Please call with questions as needed Discussed with RN  Gerlean Ren MD [facility]

## 2022-08-17 NOTE — ED Notes (Signed)
ED TO INPATIENT HANDOFF REPORT  ED Nurse Name and Phone #: [phone]  S Name/Age/Gender Debra Leach 72 y.o. female Room/Bed: 009C/009C  Code Status   Code Status: Full Code  Home/SNF/Other Home Patient oriented to: self, place, time, and situation Is this baseline? Yes   Triage Complete: Triage complete  Chief Complaint Sepsis [facility]) [A41.9]  Triage Note Pt to ED via EMS from home. Pt's husband reports expressive aphasia since 3pm 3/11. LKW 3pm 3/11. Pt also reports flu like symptoms x3 days. Pt went to PCP today and had neg flu and covid test. Pt has fever of 100.9 oral. 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   EMS Vitals: 20 LAC 150/70 125 HR 280 CBG    Allergies Allergies  Allergen Reactions   Sudafed [Pseudoephedrine Hcl] Hives, Itching and Anxiety   Atenolol Other (See Comments)    Migraine   Codeine Itching and Swelling    REACTION: Migraine   Epinephrine Hives   Hydrocodone Other (See Comments)    Migraine   Ibuprofen Other (See Comments)    Migraine   Jardiance [Empagliflozin] Other (See Comments)    Caused uti Yeast infection   Oxycodone Other (See Comments)    Migraine   Penicillins Itching and Swelling    ** Tolerates cephalosporins Has patient had a PCN reaction causing immediate rash, facial/tongue/throat swelling, SOB or lightheadedness with hypotension: No Has patient had a PCN reaction causing severe rash involving mucus membranes or skin necrosis: No Has patient had a PCN reaction that required hospitalization: No Has patient had a PCN reaction occurring within the last 10 years: No     Pentazocine Lactate     REACTION: Swelling, itching, rash   Promethazine Other (See Comments)    migraine   Propranolol Other (See Comments)    Migraine   Talwin [Pentazocine] Other (See Comments)    Swelling and itching    Tramadol Other (See Comments)    Migraine   Crestor [Rosuvastatin] Other (See Comments)    Muscle pain    Level of  Care/Admitting Diagnosis ED Disposition     ED Disposition  Admit   Condition  --   [place]: [facility] [[id number]]  Level of Care: Progressive [102]  Admit to Progressive based on following criteria: MULTISYSTEM THREATS such as stable sepsis, metabolic/electrolyte imbalance with or without encephalopathy that is responding to early treatment.  May admit patient to [facility] or [facility] if equivalent level of care is available:: No  Covid Evaluation: Asymptomatic - no recent exposure (last 10 days) testing not required  Diagnosis: Sepsis [facility]) [id number]  Admitting Physician: Kayleen Memos [id number]  Attending Physician: Kayleen Memos [id number]  Certification:: I certify this patient will need inpatient services for at least 2 midnights  Estimated Length of Stay: 2          B Medical/Surgery History 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   MVP (mitral valve prolapse)    history of   PONV (postoperative nausea and vomiting)    Pulmonary emboli ([place]) 03/01/2019   2 in the right; 1 in the left   Past Surgical History:  Procedure Laterality Date   arm surgery  2012   left forearm fracture w/ plates pins screws   ARTERY BIOPSY Right 12/03/2021   Procedure: BIOPSY TEMPORAL ARTERY;  Surgeon: Algernon Huxley, MD;  Location: [facility] ORS;  Service: Vascular;  Laterality: Right;  Right temple   BREAST BIOPSY Left    BREAST SURGERY  2000   breast biopsy (benign)   CATARACT EXTRACTION Right 2005   CATARACT EXTRACTION Left 2010   LITHOTRIPSY  08/12/2008   stent placed bilaterally   PANCREAS BIOPSY  2021   PANCREAS BIOPSY  2019   PARTIAL HYSTERECTOMY     Both ovaries remain, vaginal, for mennorhagia   REPAIR EXTENSOR TENDON Right 06/30/2022   Procedure: Middle and ring finger sagittal band reconstruction;  Surgeon: Sherilyn Cooter, MD;  Location: [place];  Service: Orthopedics;  Laterality: Right;   TONSILLECTOMY     TUBAL LIGATION  1980     A IV Location/Drains/Wounds Patient Lines/Drains/Airways Status     Active Line/Drains/Airways     Name Placement date Placement time Site Days   Peripheral IV 08/16/22 18 G Right Antecubital 08/16/22  1934  Antecubital  1   Peripheral IV 08/16/22 20 G Left Antecubital 08/16/22  1935  Antecubital  1   Peripheral IV 08/16/22 18 G Anterior;Left;Upper Arm 08/16/22  1935  Arm  1            Intake/Output Last 24 hours No intake or output data in the 24 hours ending 08/17/22 1053  Labs/Imaging Results for orders placed or performed during the hospital encounter of 08/16/22 (from the past 48 hour(s))  CBG monitoring, ED     Status: Abnormal   Collection Time: 08/16/22  7:03 PM  Result Value Ref Range   Glucose-Capillary 285 (H) 70 - 99 mg/dL    Comment: Glucose reference range applies only to samples taken after fasting for at least 8 hours.  Protime-INR     Status: Abnormal   Collection Time: 08/16/22  7:20 PM  Result Value Ref Range   Prothrombin Time 15.9 (H) 11.4 - 15.2 seconds   INR 1.3 (H) 0.8 - 1.2    Comment: (NOTE) INR goal varies based on device and disease states. Performed at [facility], [place] [address] [place], [place] [address]   APTT     Status: None   Collection Time: 08/16/22  7:20 PM  Result Value Ref Range    aPTT 26 24 - 36 seconds    Comment: Performed at [facility] [address], [place] [address]  CBC     Status: Abnormal   Collection Time: 08/16/22  7:20 PM  Result Value Ref Range   WBC 12.8 (H) 4.0 - 10.5 K/uL   RBC 4.15 3.87 - 5.11 MIL/uL   Hemoglobin 11.6 (L) 12.0 - 15.0 g/dL   HCT 36.3 36.0 - 46.0 %   MCV 87.5 80.0 - 100.0 fL   MCH  28.0 26.0 - 34.0 pg   MCHC 32.0 30.0 - 36.0 g/dL   RDW 17.8 (H) 11.5 - 15.5 %   Platelets 224 150 - 400 K/uL   nRBC 0.0 0.0 - 0.2 %    Comment: Performed at [facility] [address] [place], [place] [address]  Differential     Status: Abnormal   Collection Time: 08/16/22  7:20 PM  Result Value Ref Range   Neutrophils Relative % 90 %   Neutro Abs 11.6 (H) 1.7 - 7.7 K/uL   Lymphocytes Relative 4 %   Lymphs Abs 0.5 (L) 0.7 - 4.0 K/uL   Monocytes Relative 5 %   Monocytes Absolute 0.6 0.1 - 1.0 K/uL   Eosinophils Relative 0 %   Eosinophils Absolute 0.1 0.0 - 0.5 K/uL   Basophils Relative 0 %   Basophils Absolute 0.0 0.0 - 0.1 K/uL   Immature Granulocytes 1 %   Abs Immature Granulocytes 0.08 (H) 0.00 - 0.07 K/uL    Comment: Performed at [facility] [address] [place], [place] [address]  Comprehensive metabolic panel     Status: Abnormal   Collection Time: 08/16/22  7:20 PM  Result Value Ref Range   Sodium 132 (L) 135 - 145 mmol/L   Potassium 4.0 3.5 - 5.1 mmol/L   Chloride 96 (L) 98 - 111 mmol/L   CO2 24 22 - 32 mmol/L   Glucose, Bld 293 (H) 70 - 99 mg/dL    Comment: Glucose reference range applies only to samples taken after fasting for at least 8 hours.   BUN 7 (L) 8 - 23 mg/dL   Creatinine, Ser 1.13 (H) 0.44 - 1.00 mg/dL   Calcium 7.9 (L) 8.9 - 10.3 mg/dL   Total Protein 5.8 (L) 6.5 - 8.1 g/dL   Albumin 2.3 (L) 3.5 - 5.0 g/dL   AST 24 15 - 41 U/L   ALT 19 0 - 44 U/L   Alkaline Phosphatase 69 38 - 126 U/L   Total Bilirubin 0.6 0.3 - 1.2 mg/dL   GFR, Estimated 52 (L) >60 mL/min    Comment:  (NOTE) Calculated using the CKD-EPI Creatinine Equation (2021)    Anion gap 12 5 - 15    Comment: Performed at [facility], [place] [address] [place], [place] [address]  Ethanol     Status: None   Collection Time: 08/16/22  7:20 PM  Result Value Ref Range   Alcohol, Ethyl (B) <10 <10 mg/dL    Comment: (NOTE) Lowest detectable limit for serum alcohol is 10 mg/dL.  For medical purposes only. Performed at [facility], [place] [address], [place], [place] [address]   Lactic acid, plasma     Status: Abnormal   Collection Time: 08/16/22  7:20 PM  Result Value Ref Range   Lactic Acid, Venous 2.0 (HH) 0.5 - 1.9 mmol/L    Comment: CRITICAL RESULT CALLED TO, READ BACK BY AND VERIFIED WITH M.REGEIS,RN '@[id number]'$  08/16/2022 [email or web address].J Performed at [facility], [place] [address], [place],  [address]   Resp panel by RT-PCR (RSV, Flu A&B, Covid) Anterior Nasal Swab     Status: None   Collection Time: 08/16/22  7:29 PM   Specimen: Anterior Nasal Swab  Result Value Ref Range   SARS Coronavirus 2 by RT PCR NEGATIVE NEGATIVE   Influenza A by PCR NEGATIVE NEGATIVE   Influenza B by PCR NEGATIVE NEGATIVE    Comment: (NOTE) The Xpert  Xpress SARS-CoV-2/FLU/RSV plus assay is intended as an aid in the diagnosis of influenza from Nasopharyngeal swab specimens and should not be used as a sole basis for treatment. Nasal washings and aspirates are unacceptable for Xpert Xpress SARS-CoV-2/FLU/RSV testing.  Fact Sheet for Patients: EntrepreneurPulse.com.au  Fact Sheet for Healthcare Providers: IncredibleEmployment.be  This test is not yet approved or cleared by the [place] FDA and has been authorized for detection and/or diagnosis of SARS-CoV-2 by FDA under an Emergency Use Authorization (EUA). This EUA will remain in effect (meaning this test can be used) for the duration of the COVID-19 declaration under Section 564(b)(1) of the Act, 21  U.S.C. section 360bbb-3(b)(1), unless the authorization is terminated or revoked.     Resp Syncytial Virus by PCR NEGATIVE NEGATIVE    Comment: (NOTE) Fact Sheet for Patients: EntrepreneurPulse.com.au  Fact Sheet for Healthcare Providers: IncredibleEmployment.be  This test is not yet approved or cleared by the [place] FDA and has been authorized for detection and/or diagnosis of SARS-CoV-2 by FDA under an Emergency Use Authorization (EUA). This EUA will remain in effect (meaning this test can be used) for the duration of the COVID-19 declaration under Section 564(b)(1) of the Act, 21 U.S.C. section 360bbb-3(b)(1), unless the authorization is terminated or revoked.  Performed at [facility], [place] [address], [place], [place] [address]   I-stat chem 8, ED     Status: Abnormal   Collection Time: 08/16/22  7:32 PM  Result Value Ref Range   Sodium 133 (L) 135 - 145 mmol/L   Potassium 4.1 3.5 - 5.1 mmol/L   Chloride 98 98 - 111 mmol/L   BUN 7 (L) 8 - 23 mg/dL   Creatinine, Ser 1.10 (H) 0.44 - 1.00 mg/dL   Glucose, Bld 284 (H) 70 - 99 mg/dL    Comment: Glucose reference range applies only to samples taken after fasting for at least 8 hours.   Calcium, Ion 1.01 (L) 1.15 - 1.40 mmol/L   TCO2 25 22 - 32 mmol/L   Hemoglobin 12.2 12.0 - 15.0 g/dL   HCT 36.0 36.0 - 46.0 %  Blood culture (routine x 2)     Status: None (Preliminary result)   Collection Time: 08/16/22  8:11 PM   Specimen: BLOOD  Result Value Ref Range   Specimen Description BLOOD SITE NOT SPECIFIED    Special Requests      BOTTLES DRAWN AEROBIC AND ANAEROBIC Blood Culture results may not be optimal due to an inadequate volume of blood received in culture bottles   Culture      NO GROWTH < 12 HOURS Performed at [facility], [place] [address] [place], [place] [address]    Report Status PENDING   Lactic acid, plasma     Status: None   Collection Time: 08/16/22 10:45  PM  Result Value Ref Range   Lactic Acid, Venous 1.6 0.5 - 1.9 mmol/L    Comment: Performed at [facility] [address], [place], [place] [address]  Urinalysis, Routine w reflex microscopic -Urine, Clean Catch     Status: Abnormal   Collection Time: 08/17/22 12:13 AM  Result Value Ref Range   Color, Urine YELLOW YELLOW   APPearance CLOUDY (A) CLEAR   Specific Gravity, Urine 1.024 1.005 - 1.030   pH 6.0 5.0 - 8.0   Glucose, UA NEGATIVE NEGATIVE mg/dL   Hgb urine dipstick SMALL (A) NEGATIVE   Bilirubin Urine NEGATIVE NEGATIVE   Ketones, ur 5 (  A) NEGATIVE mg/dL   Protein, ur NEGATIVE NEGATIVE mg/dL   Nitrite NEGATIVE NEGATIVE   Leukocytes,Ua LARGE (A) NEGATIVE   RBC / HPF 11-20 0 - 5 RBC/hpf   WBC, UA >50 0 - 5 WBC/hpf   Bacteria, UA RARE (A) NONE SEEN   Squamous Epithelial / HPF 21-50 0 - 5 /HPF   Non Squamous Epithelial 6-10 (A) NONE SEEN    Comment: Performed at [facility], [place] [address], [place], [place] [address]  MRSA Next Gen by PCR, Nasal     Status: None   Collection Time: 08/17/22 12:41 AM   Specimen: Nasal Mucosa; Nasal Swab  Result Value Ref Range   MRSA by PCR Next Gen NOT DETECTED NOT DETECTED    Comment: (NOTE) The GeneXpert MRSA Assay (FDA approved for NASAL specimens only), is one component of a comprehensive MRSA colonization surveillance program. It is not intended to diagnose MRSA infection nor to guide or monitor treatment for MRSA infections. Test performance is not FDA approved in patients less than 18 years old. Performed at [facility], [place] [address] [place], [place] [address]   CBG monitoring, ED     Status: Abnormal   Collection Time: 08/17/22  1:39 AM  Result Value Ref Range   Glucose-Capillary 220 (H) 70 - 99 mg/dL    Comment: Glucose reference range applies only to samples taken after fasting for at least 8 hours.  CBC     Status: Abnormal   Collection Time: 08/17/22  2:10 AM  Result Value Ref Range   WBC 11.2 (H)  4.0 - 10.5 K/uL   RBC 3.82 (L) 3.87 - 5.11 MIL/uL   Hemoglobin 10.7 (L) 12.0 - 15.0 g/dL   HCT 33.1 (L) 36.0 - 46.0 %   MCV 86.6 80.0 - 100.0 fL   MCH 28.0 26.0 - 34.0 pg   MCHC 32.3 30.0 - 36.0 g/dL   RDW 18.2 (H) 11.5 - 15.5 %   Platelets 213 150 - 400 K/uL   nRBC 0.0 0.0 - 0.2 %    Comment: Performed at [facility] [address], [place], [place] [address]  Comprehensive metabolic panel     Status: Abnormal   Collection Time: 08/17/22  2:10 AM  Result Value Ref Range   Sodium 136 135 - 145 mmol/L   Potassium 3.4 (L) 3.5 - 5.1 mmol/L   Chloride 104 98 - 111 mmol/L   CO2 22 22 - 32 mmol/L   Glucose, Bld 217 (H) 70 - 99 mg/dL    Comment: Glucose reference range applies only to samples taken after fasting for at least 8 hours.   BUN 7 (L) 8 - 23 mg/dL   Creatinine, Ser 1.03 (H) 0.44 - 1.00 mg/dL   Calcium 7.7 (L) 8.9 - 10.3 mg/dL   Total Protein 5.2 (L) 6.5 - 8.1 g/dL   Albumin 2.0 (L) 3.5 - 5.0 g/dL   AST 23 15 - 41 U/L   ALT 17 0 - 44 U/L   Alkaline Phosphatase 59 38 - 126 U/L   Total Bilirubin 0.5 0.3 - 1.2 mg/dL   GFR, Estimated 58 (L) >60 mL/min    Comment: (NOTE) Calculated using the CKD-EPI Creatinine Equation (2021)    Anion gap 10 5 - 15    Comment: Performed at [facility], [place] [address] [place], [place] [address]  Magnesium     Status: Abnormal   Collection Time: 08/17/22  2:10 AM  Result Value Ref Range   Magnesium 1.6 (L) 1.7 - 2.4 mg/dL    Comment: Performed at [facility] [address], [place], [place] [address]  Phosphorus     Status: None   Collection Time: 08/17/22  2:10 AM  Result Value Ref Range   Phosphorus 3.7 2.5 - 4.6 mg/dL    Comment: Performed at [facility] [address] [place], [place] [address]  CBG monitoring, ED     Status: Abnormal   Collection Time: 08/17/22  7:23 AM  Result Value Ref Range   Glucose-Capillary 157 (H) 70 - 99 mg/dL    Comment: Glucose reference range applies only to samples taken  after fasting for at least 8 hours.   MR BRAIN WO CONTRAST  Result Date: 08/16/2022 CLINICAL DATA:  Speech difficulty, stroke suspected EXAM: MRI HEAD WITHOUT CONTRAST TECHNIQUE: Multiplanar, multiecho pulse sequences of the brain and surrounding structures were obtained without intravenous contrast. COMPARISON:  None Available. FINDINGS: Brain: No restricted diffusion to suggest acute or subacute infarct. No acute hemorrhage, mass, mass effect, or midline shift. No hydrocephalus or extra-axial collection. Normal craniocervical junction. No hemosiderin deposition to suggest remote hemorrhage. Vascular: Normal arterial flow voids. Skull and upper cervical spine: Normal marrow signal. Sinuses/Orbits: Clear paranasal sinuses. No acute finding in the orbits. Status post bilateral lens replacements. Other: The mastoid air cells are well aerated. IMPRESSION: No acute intracranial process. No evidence of acute or subacute infarct. Electronically Signed   By: Merilyn Baba M.D.   On: 08/16/2022 22:28   DG Chest Portable 1 View  Result Date: 08/16/2022 CLINICAL DATA:  Flu-like symptoms and fever EXAM: PORTABLE CHEST 1 VIEW COMPARISON:  12/11/2021 FINDINGS: Elevated right hemidiaphragm with right basilar atelectasis. Additional left basilar atelectasis. No pleural effusion or pneumothorax. Stable cardiomediastinal silhouette. No acute osseous abnormality. IMPRESSION: No change from 12/11/2021. Elevated right hemidiaphragm with right basilar atelectasis. Electronically Signed   By: Placido Sou M.D.   On: 08/16/2022 20:25   CT ANGIO HEAD NECK W WO CM (CODE STROKE)  Result Date: 08/16/2022 CLINICAL DATA:  Aphasia EXAM: CT ANGIOGRAPHY HEAD AND NECK TECHNIQUE: Multidetector CT imaging of the head and neck was performed using the standard protocol during bolus administration of intravenous contrast. Multiplanar CT image reconstructions and MIPs were obtained to evaluate the vascular anatomy. Carotid stenosis  measurements (when applicable) are obtained utilizing NASCET criteria, using the distal internal carotid diameter as the denominator. RADIATION DOSE REDUCTION: This exam was performed according to the departmental dose-optimization program which includes automated exposure control, adjustment of the mA and/or kV according to patient size and/or use of iterative reconstruction technique. CONTRAST:  42m OMNIPAQUE IOHEXOL 350 MG/ML SOLN COMPARISON:  No prior CTA available, correlation is made with 08/16/2022 CT head and 12/23/2020 MRA head FINDINGS: CT HEAD FINDINGS For noncontrast findings, please see same day CT head. CTA NECK FINDINGS Aortic arch: Standard branching. Imaged portion shows no evidence of aneurysm or dissection. No significant stenosis of the major arch vessel origins. Right carotid system: No evidence of dissection, occlusion, or hemodynamically significant stenosis (greater than 50%). Left carotid system: No evidence of dissection, occlusion, or hemodynamically significant stenosis (greater than 50%). Vertebral arteries: No evidence of dissection, occlusion, or hemodynamically significant stenosis (greater than 50%). Skeleton: No acute osseous abnormality. Degenerative changes in the cervical spine. Other neck: Hypoenhancing nodules in the thyroid, the largest of which measures up to 7 mm, for which no follow-up is indicated. (Reference: J Am Coll  Radiol. 2015 Feb;12(2): 143-50) Upper chest: Negative. Review of the MIP images confirms the above findings CTA HEAD FINDINGS Anterior circulation: Both internal carotid arteries are patent to the termini, without significant stenosis. A1 segments patent. Normal anterior communicating artery. Anterior cerebral arteries are patent to their distal aspects. No M1 stenosis or occlusion. MCA branches perfused and symmetric. Posterior circulation: Vertebral arteries patent to the vertebrobasilar junction without stenosis. Basilar patent to its distal aspect.  Superior cerebellar arteries patent proximally. Patent P1 segments. PCAs perfused to their distal aspects without stenosis. The bilateral posterior communicating arteries are not visualized. Venous sinuses: As permitted by contrast timing, patent. Anatomic variants: None significant. Review of the MIP images confirms the above findings IMPRESSION: 1. No intracranial large vessel occlusion or significant stenosis. 2. No hemodynamically significant stenosis in the neck. These findings were discussed by telephone on 08/16/2022 at 7:20 pm with provider MCNEILL KIRKPATRICK . Electronically Signed   By: Merilyn Baba M.D.   On: 08/16/2022 19:29   CT HEAD CODE STROKE WO CONTRAST  Result Date: 08/16/2022 CLINICAL DATA:  Code stroke.  Neuro deficit, acute, stroke suspected EXAM: CT HEAD WITHOUT CONTRAST TECHNIQUE: Contiguous axial images were obtained from the base of the skull through the vertex without intravenous contrast. RADIATION DOSE REDUCTION: This exam was performed according to the departmental dose-optimization program which includes automated exposure control, adjustment of the mA and/or kV according to patient size and/or use of iterative reconstruction technique. COMPARISON:  CT head Nov 02, 2021. FINDINGS: Brain: No evidence of acute infarction, hemorrhage, hydrocephalus, extra-axial collection or mass lesion/mass effect. Vascular: No hyperdense vessel. Skull: No acute fracture. Sinuses/Orbits: No acute finding. Other: No mastoid effusion. ASPECTS [facility] Stroke Program Early CT Score) total score (0-10 with 10 being normal): 10. IMPRESSION: 1. No evidence of acute abnormality. 2. ASPECTS is 10. Code stroke imaging results were communicated on 08/16/2022 at 7:13 pm to provider Dr. Leonel Ramsay via secure text paging. Electronically Signed   By: Margaretha Sheffield M.D.   On: 08/16/2022 19:13    Pending Labs Unresulted Labs (From admission, onward)     Start     Ordered   08/18/22 [id number]  Basic metabolic  panel  Tomorrow morning,   R        08/17/22 0830   08/18/22 0500  CBC  Tomorrow morning,   R        08/17/22 0830   08/18/22 0500  Magnesium  Tomorrow morning,   R        08/17/22 0830   08/16/22 2011  Blood culture (routine x 2)  BLOOD CULTURE X 2,   R      08/16/22 2011   08/16/22 1951  Urine Culture  Once,   URGENT       Question:  Indication  Answer:  Sepsis   08/16/22 1951            Vitals/Pain Today's Vitals   08/17/22 0743 08/17/22 0800 08/17/22 0900 08/17/22 1000  BP:  (!) 154/53 (!) 134/58 129/62  Pulse:  80 79 80  Resp:  '14 15 15  '$ Temp:      TempSrc:      SpO2:  99% 98% 99%  Weight:      Height:      PainSc: 0-No pain       Isolation Precautions No active isolations  Medications Medications  sodium chloride flush (NS) 0.9 % injection 3 mL (3 mLs Intravenous Not Given 08/16/22 1940)  acetaminophen (TYLENOL) tablet  650 mg (650 mg Oral Given 08/17/22 0028)  apixaban (ELIQUIS) tablet 2.5 mg (has no administration in time range)  atorvastatin (LIPITOR) tablet 80 mg (80 mg Oral Given 08/17/22 0142)  colesevelam [facility]) tablet 1,875 mg (1,875 mg Oral Not Given 08/17/22 0745)  ferrous gluconate (FERGON) tablet 324 mg (has no administration in time range)  gabapentin (NEURONTIN) capsule 300 mg (300 mg Oral Given 08/17/22 0142)  pantoprazole (PROTONIX) EC tablet 40 mg (has no administration in time range)  cyanocobalamin (VITAMIN B12) tablet 1,000 mcg (1,000 mcg Oral Given 08/17/22 0745)  insulin aspart (novoLOG) injection 0-9 Units (2 Units Subcutaneous Given 08/17/22 0746)  insulin aspart (novoLOG) injection 0-5 Units (2 Units Subcutaneous Given 08/17/22 0142)  lactated ringers infusion ( Intravenous New Bag/Given 08/17/22 0139)  prochlorperazine (COMPAZINE) injection 5 mg (has no administration in time range)  melatonin tablet 5 mg (has no administration in time range)  polyethylene glycol (MIRALAX / GLYCOLAX) packet 17 g (has no administration in time range)   ipratropium-albuterol (DUONEB) 0.5-2.5 (3) MG/3ML nebulizer solution 3 mL (has no administration in time range)  metoprolol tartrate (LOPRESSOR) injection 5 mg (has no administration in time range)  hydrALAZINE (APRESOLINE) injection 10 mg (has no administration in time range)  senna-docusate (Senokot-S) tablet 1 tablet (has no administration in time range)  guaiFENesin (ROBITUSSIN) 100 MG/5ML liquid 5 mL (has no administration in time range)  traZODone (DESYREL) tablet 50 mg (has no administration in time range)  ondansetron (ZOFRAN) injection 4 mg (has no administration in time range)  insulin glargine-yfgn (SEMGLEE) injection 10 Units (has no administration in time range)  cefTRIAXone (ROCEPHIN) 1 g in sodium chloride 0.9 % 100 mL IVPB (0 g Intravenous Stopped 08/17/22 0931)  iohexol (OMNIPAQUE) 350 MG/ML injection 75 mL (75 mLs Intravenous Contrast Given 08/16/22 1919)  acetaminophen (TYLENOL) tablet 650 mg (650 mg Oral Given 08/16/22 2003)  metoCLOPramide (REGLAN) injection 10 mg (10 mg Intravenous Given 08/16/22 2003)  lactated ringers bolus 1,000 mL (0 mLs Intravenous Stopped 08/16/22 2243)  lactated ringers bolus 1,000 mL (0 mLs Intravenous Stopped 08/16/22 2357)  metroNIDAZOLE (FLAGYL) IVPB 500 mg (0 mg Intravenous Stopped 08/16/22 2357)  vancomycin (VANCOREADY) IVPB 1500 mg/300 mL (0 mg Intravenous Stopped 08/17/22 0136)  potassium chloride SA (KLOR-CON M) CR tablet 40 mEq (40 mEq Oral Given 08/17/22 0631)  magnesium sulfate IVPB 2 g 50 mL (0 g Intravenous Stopped 08/17/22 0849)    Mobility walks with device     Focused Assessments Neuro Assessment Handoff:  Swallow screen pass? Yes    NIH Stroke Scale  Dizziness Present: No Headache Present: No Interval: Initial Level of Consciousness (1a.)   : Alert, keenly responsive LOC Questions (1b. )   : Answers both questions correctly LOC Commands (1c. )   : Performs both tasks correctly Best Gaze (2. )  : Normal Visual (3. )  : No  visual loss Facial Palsy (4. )    : Normal symmetrical movements Motor Arm, Left (5a. )   : No drift Motor Arm, Right (5b. ) : No drift Motor Leg, Left (6a. )  : No drift Motor Leg, Right (6b. ) : No drift Limb Ataxia (7. ): Absent Sensory (8. )  : Normal, no sensory loss Best Language (9. )  : No aphasia Dysarthria (10. ): Normal Extinction/Inattention (11.)   : No Abnormality Complete NIHSS TOTAL: 0 Last date known well: 08/16/22 Last time known well: 1500 Neuro Assessment: Within Defined Limits Neuro Checks:   Initial (08/16/22  1930)  Has TPA been given? No If patient is a Neuro Trauma and patient is going to OR before floor call report to [place] nurse: [phone] or [phone]   R Recommendations: See Admitting Provider Note  Report given to:   Additional Notes:  just had BM changed and turns very well

## 2022-08-17 NOTE — Evaluation (Signed)
Occupational Therapy Evaluation Patient Details Name: Debra Leach MRN: [id number] DOB: 02-01-1951 Today's Date: 08/17/2022   History of Present Illness 72 y.o. 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  MR Brain: No acute intracranial process. No evidence of acute or subacute  infarct.  Sepsis secondary to UTI/left lower leg cellulitis.   Clinical Impression   Patient admitted for the diagnosis above.  PTA she lives with her spouse, who currently is assisting with community mobility and iADL due to recent hand surgery, but she is close to her baseline for ADL completion.  She admits to still feeling "a little off", and is unsteady, but does not require any assistive device.  No acute OT needs exist, and patient was undergoing outpatient hand therapy.  Mobility referral placed and PT Consult pending.          Recommendations for follow up therapy are one component of a multi-disciplinary discharge planning process, led by the attending physician.  Recommendations may be updated based on patient status, additional functional criteria and insurance authorization.   Follow Up Recommendations  Other (comment) (Continue outpatient hand therapy)     Assistance Recommended at Discharge Set up Supervision/Assistance  Patient can return home with the following Assist for transportation;Assistance with cooking/housework    Functional Status Assessment  Patient has not had a recent decline in their functional status  Equipment Recommendations  None recommended by OT    Recommendations for Other Services       Precautions / Restrictions Precautions Precautions: Fall Required Braces or Orthoses: Splint/Cast Splint/Cast: R hand splint post  hand surgery for tendon repair. Restrictions Weight Bearing Restrictions: No      Mobility Bed Mobility Overal bed mobility: Independent                  Transfers Overall transfer level: Modified independent                 General transfer comment: pushing IV pole. Patient stating she still feels a little off/unsteady.      Balance Overall balance assessment: Mild deficits observed, not formally tested                                         ADL either performed or assessed with clinical judgement   ADL Overall ADL's : At baseline                                             Vision Baseline Vision/History: 1 Wears glasses Patient Visual Report: No change from baseline       Perception     Praxis      Pertinent Vitals/Pain Pain Assessment Pain Assessment: No/denies pain     Hand Dominance Right   Extremity/Trunk Assessment Upper Extremity Assessment Upper Extremity Assessment: Overall WFL for tasks assessed   Lower Extremity Assessment Lower Extremity Assessment: Overall WFL for tasks assessed   Cervical / Trunk Assessment Cervical / Trunk Assessment: Normal   Communication Communication Communication: No difficulties   Cognition Arousal/Alertness: Awake/alert Behavior During Therapy: WFL for tasks assessed/performed Overall Cognitive Status: Within  Functional Limits for tasks assessed                                       General Comments   VSS on RA    Exercises     Shoulder Instructions      Home Living Family/patient expects to be discharged to:: Private residence Living Arrangements: Spouse/significant other Available Help at Discharge: Family;Available 24 hours/day Type of Home: House Home Access: Stairs to enter [employer] of Steps: 6 Entrance Stairs-Rails: Left Home Layout: Two level;Able to live on main level with bedroom/bathroom Alternate Level  Stairs-Number of Steps: bonus room up that she does not currently have to access.   Bathroom Shower/Tub: Occupational psychologist: Standard Bathroom Accessibility: Yes How Accessible: Accessible via walker Home Equipment: Shower seat          Prior Functioning/Environment Prior Level of Function : Independent/Modified Independent;Driving             Mobility Comments: Walks without an AD ADLs Comments: Spouse has been assisting with community mobility and iADL since hand surgery.        OT Problem List: Impaired balance (sitting and/or standing)      OT Treatment/Interventions:      OT Goals(Current goals can be found in the care plan section) Acute Rehab OT Goals Patient Stated Goal: Hoping to return home tomorrow 3/13. OT Goal Formulation: With patient Time For Goal Achievement: 08/20/22 Potential to Achieve Goals: Good  OT Frequency:      Co-evaluation              AM-PAC OT "6 Clicks" Daily Activity     Outcome Measure Help from another person eating meals?: None Help from another person taking care of personal grooming?: None Help from another person toileting, which includes using toliet, bedpan, or urinal?: A Little Help from another person bathing (including washing, rinsing, drying)?: A Little Help from another person to put on and taking off regular upper body clothing?: None Help from another person to put on and taking off regular lower body clothing?: A Little 6 Click Score: 21   End of Session Nurse Communication: Mobility status  Activity Tolerance: Patient tolerated treatment well Patient left: in chair;with call bell/phone within reach  OT Visit Diagnosis: Unsteadiness on feet (R26.81)                Time: 1450-1511 OT Time Calculation (min): 21 min Charges:  OT General Charges $OT Visit: 1 Visit OT Evaluation $OT Eval Moderate Complexity: 1 Mod  08/17/2022  RP, OTR/L  Acute Rehabilitation Services  Office:   [phone]   Metta Clines 08/17/2022, 3:19 PM

## 2022-08-18 ENCOUNTER — Other Ambulatory Visit: Payer: Medicare Other

## 2022-08-18 DIAGNOSIS — A419 Sepsis, unspecified organism: Principal | ICD-10-CM

## 2022-08-18 LAB — CBC
HCT: 33.7 % — ABNORMAL LOW (ref 36.0–46.0)
Hemoglobin: 10.9 g/dL — ABNORMAL LOW (ref 12.0–15.0)
MCH: 28 pg (ref 26.0–34.0)
MCHC: 32.3 g/dL (ref 30.0–36.0)
MCV: 86.6 fL (ref 80.0–100.0)
Platelets: 234 10*3/uL (ref 150–400)
RBC: 3.89 MIL/uL (ref 3.87–5.11)
RDW: 18.6 % — ABNORMAL HIGH (ref 11.5–15.5)
WBC: 8.3 10*3/uL (ref 4.0–10.5)
nRBC: 0 % (ref 0.0–0.2)

## 2022-08-18 LAB — BASIC METABOLIC PANEL
Anion gap: 8 (ref 5–15)
BUN: 6 mg/dL — ABNORMAL LOW (ref 8–23)
CO2: 22 mmol/L (ref 22–32)
Calcium: 7.9 mg/dL — ABNORMAL LOW (ref 8.9–10.3)
Chloride: 110 mmol/L (ref 98–111)
Creatinine, Ser: 0.9 mg/dL (ref 0.44–1.00)
GFR, Estimated: >60 mL/min (ref >60)
Glucose, Bld: 116 mg/dL — ABNORMAL HIGH (ref 70–99)
Potassium: 4 mmol/L (ref 3.5–5.1)
Sodium: 140 mmol/L (ref 135–145)

## 2022-08-18 LAB — URINE CULTURE

## 2022-08-18 LAB — GLUCOSE, CAPILLARY
Glucose-Capillary: 117 mg/dL — ABNORMAL HIGH (ref 70–99)
Glucose-Capillary: 202 mg/dL — ABNORMAL HIGH (ref 70–99)

## 2022-08-18 LAB — MAGNESIUM: Magnesium: 2.2 mg/dL (ref 1.7–2.4)

## 2022-08-18 MED ORDER — GABAPENTIN 300 MG PO CAPS: 600.0000 mg | ORAL_CAPSULE | Freq: Every day | ORAL | Status: DC

## 2022-08-18 MED ORDER — CEFDINIR 300 MG PO CAPS: 300.0000 mg | ORAL_CAPSULE | Freq: Two times a day (BID) | ORAL | 0 refills | Status: DC

## 2022-08-18 NOTE — Discharge Instructions (Signed)
Follow with [name], MD in 5-7 days  Please get a complete blood count and chemistry panel checked by your Primary MD at your next visit, and again as instructed by your Primary MD. Please get your medications reviewed and adjusted by your Primary MD.  Please request your Primary MD to go over all Hospital Tests and Procedure/Radiological results at the follow up, please get all Hospital records sent to your Prim MD by signing hospital release before you go home.  In some cases, there will be blood work, cultures and biopsy results pending at the time of your discharge. Please request that your primary care M.D. goes through all the records of your hospital data and follows up on these results.  If you had Pneumonia of Lung problems at the Hospital: Please get a 2 view Chest X ray done in 6-8 weeks after hospital discharge or sooner if instructed by your Primary MD.  If you have Congestive Heart Failure: Please call your Cardiologist or Primary MD anytime you have any of the following symptoms:  1) 3 pound weight gain in 24 hours or 5 pounds in 1 week  2) shortness of breath, with or without a dry hacking cough  3) swelling in the hands, feet or stomach  4) if you have to sleep on extra pillows at night in order to breathe  Follow cardiac low salt diet and 1.5 lit/day fluid restriction.  If you have diabetes Accuchecks 4 times/day, Once in AM empty stomach and then before each meal. Log in all results and show them to your primary doctor at your next visit. If any glucose reading is under 80 or above 300 call your primary MD immediately.  If you have Seizure/Convulsions/Epilepsy: Please do not drive, operate heavy machinery, participate in activities at heights or participate in high speed sports until you have seen by Primary MD or a Neurologist and advised to do so again. Per [facility] statutes, patients with seizures are not allowed to drive until they have been  seizure-free for six months.  Use caution when using heavy equipment or power tools. Avoid working on ladders or at heights. Take showers instead of baths. Ensure the water temperature is not too high on the home water heater. Do not go swimming alone. Do not lock yourself in a room alone (i.e. bathroom). When caring for infants or small children, sit down when holding, feeding, or changing them to minimize risk of injury to the child in the event you have a seizure. Maintain good sleep hygiene. Avoid alcohol.   If you had Gastrointestinal Bleeding: Please ask your Primary MD to check a complete blood count within one week of discharge or at your next visit. Your endoscopic/colonoscopic biopsies that are pending at the time of discharge, will also need to followed by your Primary MD.  Get Medicines reviewed and adjusted. Please take all your medications with you for your next visit with your Primary MD  Please request your Primary MD to go over all hospital tests and procedure/radiological results at the follow up, please ask your Primary MD to get all Hospital records sent to his/her office.  If you experience worsening of your admission symptoms, develop shortness of breath, life threatening emergency, suicidal or homicidal thoughts you must seek medical attention immediately by calling 911 or calling your MD immediately  if symptoms less severe.  You must read complete instructions/literature along with all the possible adverse reactions/side effects for all the Medicines you  take and that have been prescribed to you. Take any new Medicines after you have completely understood and accpet all the possible adverse reactions/side effects.   Do not drive or operate heavy machinery when taking Pain medications.   Do not take more than prescribed Pain, Sleep and Anxiety Medications  Special Instructions: If you have smoked or chewed Tobacco  in the last 2 yrs please stop smoking, stop any regular  Alcohol  and or any Recreational drug use.  Wear Seat belts while driving.  Please note You were cared for by a hospitalist during your hospital stay. If you have any questions about your discharge medications or the care you received while you were in the hospital after you are discharged, you can call the unit and asked to speak with the hospitalist on call if the hospitalist that took care of you is not available. Once you are discharged, your primary care physician will handle any further medical issues. Please note that NO REFILLS for any discharge medications will be authorized once you are discharged, as it is imperative that you return to your primary care physician (or establish a relationship with a primary care physician if you do not have one) for your aftercare needs so that they can reassess your need for medications and monitor your lab values.  You can reach the hospitalist office at phone [phone] or fax [phone]   If you do not have a primary care physician, you can call [phone] for a physician referral.  Activity: As tolerated with Full fall precautions use walker/cane & assistance as needed    Diet: regular  Disposition Home

## 2022-08-18 NOTE — Evaluation (Signed)
Physical Therapy Evaluation and D/C Patient Details Name: Debra Leach MRN: [id number] DOB: 1950-12-24 Today's Date: 08/18/2022  History of Present Illness  72 y.o. female adm 3/11 who presented with complaints of speech difficulty.  MRI Brain: No acute intracranial process. No evidence of acute or subacute  infarct.  Positive for Sepsis secondary to UTI/left lower leg cellulitis. 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 Pt was able to ambulate in hallway with supervision only and no LOB with challenges to balance. Pt feels she is at her baseline and recognizes need for cane initially on d/c.  Pt agrees she doesn't need f/u therapy. Husband to assist x 24 hours and going home today.    Recommendations for follow up therapy are one component of a multi-disciplinary discharge planning process, led by the attending physician.  Recommendations may be updated based on patient status, additional functional criteria and insurance authorization.  Follow Up Recommendations No PT follow up      Assistance Recommended at Discharge PRN  Patient can return home with the following  Help with stairs or ramp for entrance;Assist for transportation;Assistance with cooking/housework    Equipment Recommendations None recommended by PT  Recommendations for Other Services       Functional Status Assessment Patient has not had a recent decline in their functional status     Precautions / Restrictions Precautions Precautions: Fall Required Braces or Orthoses: Splint/Cast Splint/Cast: R hand splint post hand surgery for tendon repair. Restrictions Weight Bearing Restrictions: No      Mobility  Bed Mobility Overal bed mobility: Independent                  Transfers Overall transfer  level: Independent                      Ambulation/Gait Ambulation/Gait assistance: Min guard, Supervision Gait Distance (Feet): 500 Feet Assistive device: None Gait Pattern/deviations: Step-through pattern, Decreased stride length   Gait velocity interpretation: 1.31 - 2.62 ft/sec, indicative of limited community ambulator   General Gait Details: Pt was able to ambulate in hallway without device and no significant LOB. Pt reports feeling unsteady at times however no overt LOB even with challenges. Pt self corrects.  Did encourage pt to use cane prn at home for incr safety and pt agrees.  Stairs            Wheelchair Mobility    Modified Rankin (Stroke Patients Only)       Balance Overall balance assessment: Needs assistance         Standing balance support: No upper extremity supported, During functional activity Standing balance-Leahy Scale: Fair Standing balance comment: can withstand challenges to balance and self corrects                 Standardized Balance Assessment Standardized Balance Assessment : Dynamic Gait Index   Dynamic Gait Index Level Surface: Normal Change in Gait Speed: Mild Impairment Gait with Horizontal Head Turns: Mild Impairment Gait with Vertical Head Turns: Mild Impairment Gait and Pivot Turn: Mild Impairment Step Over Obstacle: Mild Impairment Step Around Obstacles: Mild Impairment Steps: Mild Impairment Total Score: 17       Pertinent Vitals/Pain Pain Assessment Pain Assessment: No/denies pain    Home Living Family/patient expects to be discharged to:: Private residence Living Arrangements: Spouse/significant other  Available Help at Discharge: Family;Available 24 hours/day Type of Home: House Home Access: Stairs to enter Entrance Stairs-Rails: Left Entrance Stairs-Number of Steps: 6 Alternate Level Stairs-Number of Steps: bonus room up that she does not currently have to access. Home Layout: Two level;Able  to live on main level with bedroom/bathroom Home Equipment: Shower seat - built in;Cane - single point      Prior Function Prior Level of Function : Independent/Modified Independent;Driving             Mobility Comments: Walks without an AD ADLs Comments: Spouse has been assisting with community mobility and iADL since hand surgery.     Hand Dominance   Dominant Hand: Right    Extremity/Trunk Assessment   Upper Extremity Assessment Upper Extremity Assessment: Defer to OT evaluation    Lower Extremity Assessment Lower Extremity Assessment: Generalized weakness    Cervical / Trunk Assessment Cervical / Trunk Assessment: Normal  Communication   Communication: No difficulties  Cognition Arousal/Alertness: Awake/alert Behavior During Therapy: WFL for tasks assessed/performed Overall Cognitive Status: Within Functional Limits for tasks assessed                                          General Comments      Exercises     Assessment/Plan    PT Assessment Patient does not need any further PT services  PT Problem List Decreased balance       PT Treatment Interventions Gait training;Functional mobility training;Balance training    PT Goals (Current goals can be found in the Care Plan section)  Acute Rehab PT Goals Patient Stated Goal: to go home with husbands assist PT Goal Formulation: With patient Time For Goal Achievement: 09/01/22 Potential to Achieve Goals: Good    Frequency       Co-evaluation               AM-PAC PT "6 Clicks" Mobility  Outcome Measure Help needed turning from your back to your side while in a flat bed without using bedrails?: None Help needed moving from lying on your back to sitting on the side of a flat bed without using bedrails?: None Help needed moving to and from a bed to a chair (including a wheelchair)?: None Help needed standing up from a chair using your arms (e.g., wheelchair or bedside chair)?:  None Help needed to walk in hospital room?: A Little Help needed climbing 3-5 steps with a railing? : A Little 6 Click Score: 22    End of Session Equipment Utilized During Treatment: Gait belt Activity Tolerance: Patient tolerated treatment well Patient left: with call bell/phone within reach;in bed Nurse Communication: Mobility status PT Visit Diagnosis: Muscle weakness (generalized) (M62.81)    Time: [id number] PT Time Calculation (min) (ACUTE ONLY): 14 min   Charges:   PT Evaluation $PT Eval Low Complexity: 1 Low          Kinaya Hilliker M,PT Acute Rehab Services [phone]   Alvira Philips 08/18/2022, 12:32 PM

## 2022-08-18 NOTE — Discharge Summary (Addendum)
Physician Discharge Summary  Debra Leach [id number] DOB: 05/23/1951 DOA: 08/16/2022  PCP: [name], MD  Admit date: 08/16/2022 Discharge date: 08/18/2022  Admitted From: home Disposition:  home  Recommendations for Outpatient Follow-up:  Follow up with PCP in 1-2 weeks Please obtain BMP/CBC in one week  Home Health: none Equipment/Devices: none  Discharge Condition: stable CODE STATUS: Full code Diet Orders (From admission, onward)     Start     Ordered   08/17/22 0039  Diet heart healthy/carb modified Fluid consistency: Thin  Diet effective now       Question:  Fluid consistency:  Answer:  Thin   08/17/22 0039            HPI: Per admitting MD, Debra Leach is a 72 y.o. 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  Also reports having intermittent fevers for the past 3 days.  Associated with nausea and vomiting and decreased oral intake.  Today around 3 PM her speech Was.  EMS was called and code stroke was activated. In the ED, MRI was negative for stroke.  Noted to be febrile with leukocytosis, pyuria on urine analysis.  Code sepsis was called in the ED.  Cultures obtained, started on IV fluid per sepsis protocol and empiric IV antibiotics.  Admitted by [facility], hospitalist service.  Hospital Course / Discharge diagnoses: Principal problem Sepsis secondary to UTI -patient was admitted to the hospital with high fever as well as confusion, initial concern for CVA.  Neurology consulted, with negative MRI and UA findings this was believed to be sepsis due to UTI.  Patient has been experiencing foul-smelling urine for several days prior to admission.  She was placed on  ceftriaxone with excellent response, leukocytosis resolved and she was returned to baseline.  Urine cultures unfortunately showed multiple species, not predominant.  Given clinical improvement, she will be converted to oral antibiotics for a few additional days upon discharge.  There were initial concerns for left lower extremity cellulitis, she has a rash which is chronic, and cellulitis has been ruled out  Active problems History of autoimmune pancreatitis -resume home medications on discharge  Diabetes mellitus type 2, insulin-dependent -resume home medications on discharge History of right lower extremity DVT- on Eliquis Polyneuropathy - resume home medications on discharge  GERD - resume home medications on discharge  Hyperlipidemia - resume home medications on discharge  CKD stage IIIa -creatinine is at baseline Hypokalemia-replaced and now potassium stable  Discharge Instructions   Allergies as of 08/18/2022       Reactions   Sudafed [pseudoephedrine Hcl] Hives, Itching, Anxiety   Atenolol Other (See Comments)   Migraine   Codeine Itching, Swelling   REACTION: Migraine   Epinephrine Hives   Hydrocodone Other (See Comments)   Migraine   Ibuprofen Other (See Comments)   Migraine   Jardiance [empagliflozin] Other (See Comments)   Caused uti Yeast infection   Oxycodone Other (See Comments)   Migraine   Penicillins Itching, Swelling   ** Tolerates cephalosporins Has patient had a PCN reaction causing immediate rash, facial/tongue/throat swelling, SOB or lightheadedness with hypotension: No Has patient had a PCN reaction causing severe rash involving mucus membranes or skin necrosis: No Has patient had a PCN reaction that required  hospitalization: No Has patient had a PCN reaction occurring within the last 10 years: No   Pentazocine Lactate    REACTION: Swelling, itching, rash   Promethazine Other (See Comments)   migraine   Propranolol Other (See Comments)   Migraine    Talwin [pentazocine] Other (See Comments)   Swelling and itching    Tramadol Other (See Comments)   Migraine   Crestor [rosuvastatin] Other (See Comments)   Muscle pain        Medication List     TAKE these medications    acetaminophen 500 MG tablet Commonly known as: TYLENOL Take 500-1,000 mg by mouth every 6 (six) hours as needed for moderate pain or fever.   atorvastatin 80 MG tablet Commonly known as: LIPITOR Take 1 tablet (80 mg total) by mouth at bedtime.   azaTHIOprine 50 MG tablet Commonly known as: IMURAN Take 1 tablet (50 mg total) by mouth daily.   budesonide 3 MG 24 hr capsule Commonly known as: ENTOCORT EC Take 2 capsules (6 mg total) by mouth daily.   cefdinir 300 MG capsule Commonly known as: OMNICEF Take 1 capsule (300 mg total) by mouth 2 (two) times daily for 5 days.   colesevelam 625 MG tablet Commonly known as: WELCHOL TAKE 3 TABLETS BY MOUTH 2 TIMES DAILY WITH A MEAL. What changed: See the new instructions.   cyanocobalamin 1000 MCG tablet Commonly known as: VITAMIN B12 Take 1,000 mcg by mouth daily with breakfast.   Dexcom G7 Sensor Misc 1 Device by Does not apply route as directed.   Eliquis 2.5 MG Tabs tablet Generic drug: apixaban TAKE 1 TABLET BY MOUTH 2 TIMES DAILY. What changed: how much to take   ferrous gluconate 324 MG tablet Commonly known as: FERGON Take 1 tablet (324 mg total) by mouth daily with breakfast.   gabapentin 100 MG capsule Commonly known as: NEURONTIN TAKE 1 CAPSULE BY MOUTH UP TO 3 TIMES DAILY AS NEEDED. What changed: See the new instructions.   gabapentin 300 MG capsule Commonly known as: NEURONTIN TAKE 2 CAPSULES BY MOUTH AT BEDTIME. What changed: Another medication with the same name was changed. Make sure you understand how and when to take each.   Lantus SoloStar 100 UNIT/ML Solostar Pen Generic drug: insulin glargine Inject 18 Units into the skin daily.   omeprazole 20 MG capsule Commonly known  as: PRILOSEC Take 20 mg by mouth daily with supper.   ondansetron 4 MG tablet Commonly known as: Zofran Take 1 tablet (4 mg total) by mouth every 8 (eight) hours as needed for nausea or vomiting.   OneTouch Delica Plus 123456 Misc USE TO CHECK BLOOD SUGAR TWO TIMES DAILY   OneTouch Verio Reflect w/Device Kit TEST TWICE A DAY   OneTouch Verio test strip Generic drug: glucose blood USE TO CHECK BLOOD SUGAR TWO TIMES A DAY   potassium chloride SA 20 MEQ tablet Commonly known as: KLOR-CON M TAKE 1 TABLET BY MOUTH DAILY.   repaglinide 0.5 MG tablet Commonly known as: PRANDIN Take 2 tablets (1 mg total) by mouth daily before lunch. before breakfast and lunch What changed: additional instructions   Ubrelvy 100 MG Tabs Generic drug: Ubrogepant TAKE 1 TABLET BY MOUTH AS NEEDED (MAY REPEAT DOSE AFTER 2 HOURS IF NEEDED. MAXIMUM 2 TABLETS IN 24 HOURS).   Vitamin D (Ergocalciferol) 1.25 MG (50000 UNIT) Caps capsule Commonly known as: DRISDOL TAKE 1 CAPSULE BY MOUTH ONCE A WEEK.   Vitamin D3 1.25 MG (50000 UT) capsule  Generic drug: Cholecalciferol Take 1 capsule by mouth once a week.   Vyepti 100 MG/ML injection Generic drug: Eptinezumab-jjmr Inject 300 mg into the vein every 3 (three) months.       Consultations: none  Procedures/Studies:  MR BRAIN WO CONTRAST  Result Date: 08/16/2022 CLINICAL DATA:  Speech difficulty, stroke suspected EXAM: MRI HEAD WITHOUT CONTRAST TECHNIQUE: Multiplanar, multiecho pulse sequences of the brain and surrounding structures were obtained without intravenous contrast. COMPARISON:  None Available. FINDINGS: Brain: No restricted diffusion to suggest acute or subacute infarct. No acute hemorrhage, mass, mass effect, or midline shift. No hydrocephalus or extra-axial collection. Normal craniocervical junction. No hemosiderin deposition to suggest remote hemorrhage. Vascular: Normal arterial flow voids. Skull and upper cervical spine: Normal marrow  signal. Sinuses/Orbits: Clear paranasal sinuses. No acute finding in the orbits. Status post bilateral lens replacements. Other: The mastoid air cells are well aerated. IMPRESSION: No acute intracranial process. No evidence of acute or subacute infarct. Electronically Signed   By: Merilyn Baba M.D.   On: 08/16/2022 22:28   DG Chest Portable 1 View  Result Date: 08/16/2022 CLINICAL DATA:  Flu-like symptoms and fever EXAM: PORTABLE CHEST 1 VIEW COMPARISON:  12/11/2021 FINDINGS: Elevated right hemidiaphragm with right basilar atelectasis. Additional left basilar atelectasis. No pleural effusion or pneumothorax. Stable cardiomediastinal silhouette. No acute osseous abnormality. IMPRESSION: No change from 12/11/2021. Elevated right hemidiaphragm with right basilar atelectasis. Electronically Signed   By: Placido Sou M.D.   On: 08/16/2022 20:25   CT ANGIO HEAD NECK W WO CM (CODE STROKE)  Result Date: 08/16/2022 CLINICAL DATA:  Aphasia EXAM: CT ANGIOGRAPHY HEAD AND NECK TECHNIQUE: Multidetector CT imaging of the head and neck was performed using the standard protocol during bolus administration of intravenous contrast. Multiplanar CT image reconstructions and MIPs were obtained to evaluate the vascular anatomy. Carotid stenosis measurements (when applicable) are obtained utilizing NASCET criteria, using the distal internal carotid diameter as the denominator. RADIATION DOSE REDUCTION: This exam was performed according to the departmental dose-optimization program which includes automated exposure control, adjustment of the mA and/or kV according to patient size and/or use of iterative reconstruction technique. CONTRAST:  30m OMNIPAQUE IOHEXOL 350 MG/ML SOLN COMPARISON:  No prior CTA available, correlation is made with 08/16/2022 CT head and 12/23/2020 MRA head FINDINGS: CT HEAD FINDINGS For noncontrast findings, please see same day CT head. CTA NECK FINDINGS Aortic arch: Standard branching. Imaged portion  shows no evidence of aneurysm or dissection. No significant stenosis of the major arch vessel origins. Right carotid system: No evidence of dissection, occlusion, or hemodynamically significant stenosis (greater than 50%). Left carotid system: No evidence of dissection, occlusion, or hemodynamically significant stenosis (greater than 50%). Vertebral arteries: No evidence of dissection, occlusion, or hemodynamically significant stenosis (greater than 50%). Skeleton: No acute osseous abnormality. Degenerative changes in the cervical spine. Other neck: Hypoenhancing nodules in the thyroid, the largest of which measures up to 7 mm, for which no follow-up is indicated. (Reference: J Am Coll Radiol. 2015 Feb;12(2): 143-50) Upper chest: Negative. Review of the MIP images confirms the above findings CTA HEAD FINDINGS Anterior circulation: Both internal carotid arteries are patent to the termini, without significant stenosis. A1 segments patent. Normal anterior communicating artery. Anterior cerebral arteries are patent to their distal aspects. No M1 stenosis or occlusion. MCA branches perfused and symmetric. Posterior circulation: Vertebral arteries patent to the vertebrobasilar junction without stenosis. Basilar patent to its distal aspect. Superior cerebellar arteries patent proximally. Patent P1 segments. PCAs perfused to their  distal aspects without stenosis. The bilateral posterior communicating arteries are not visualized. Venous sinuses: As permitted by contrast timing, patent. Anatomic variants: None significant. Review of the MIP images confirms the above findings IMPRESSION: 1. No intracranial large vessel occlusion or significant stenosis. 2. No hemodynamically significant stenosis in the neck. These findings were discussed by telephone on 08/16/2022 at 7:20 pm with provider MCNEILL KIRKPATRICK . Electronically Signed   By: Merilyn Baba M.D.   On: 08/16/2022 19:29   CT HEAD CODE STROKE WO CONTRAST  Result  Date: 08/16/2022 CLINICAL DATA:  Code stroke.  Neuro deficit, acute, stroke suspected EXAM: CT HEAD WITHOUT CONTRAST TECHNIQUE: Contiguous axial images were obtained from the base of the skull through the vertex without intravenous contrast. RADIATION DOSE REDUCTION: This exam was performed according to the departmental dose-optimization program which includes automated exposure control, adjustment of the mA and/or kV according to patient size and/or use of iterative reconstruction technique. COMPARISON:  CT head Nov 02, 2021. FINDINGS: Brain: No evidence of acute infarction, hemorrhage, hydrocephalus, extra-axial collection or mass lesion/mass effect. Vascular: No hyperdense vessel. Skull: No acute fracture. Sinuses/Orbits: No acute finding. Other: No mastoid effusion. ASPECTS [facility] Stroke Program Early CT Score) total score (0-10 with 10 being normal): 10. IMPRESSION: 1. No evidence of acute abnormality. 2. ASPECTS is 10. Code stroke imaging results were communicated on 08/16/2022 at 7:13 pm to provider Dr. Leonel Ramsay via secure text paging. Electronically Signed   By: Margaretha Sheffield M.D.   On: 08/16/2022 19:13    Subjective: - no chest pain, shortness of breath, no abdominal pain, nausea or vomiting.   Discharge Exam: BP 131/62 (BP Location: Right Arm)   Pulse 84   Temp 98.2 F (36.8 C) (Oral)   Resp 16   Ht 5' (1.524 m)   Wt 62.6 kg   SpO2 92%   BMI 26.95 kg/m   General: Pt is alert, awake, not in acute distress Cardiovascular: RRR, S1/S2 +, no rubs, no gallops Respiratory: CTA bilaterally, no wheezing, no rhonchi Abdominal: Soft, NT, ND, bowel sounds + Extremities: no edema, no cyanosis  The results of significant diagnostics from this hospitalization (including imaging, microbiology, ancillary and laboratory) are listed below for reference.     Microbiology: Recent Results (from the past 240 hour(s))  Resp panel by RT-PCR (RSV, Flu A&B, Covid) Anterior Nasal Swab     Status:  None   Collection Time: 08/16/22  7:29 PM   Specimen: Anterior Nasal Swab  Result Value Ref Range Status   SARS Coronavirus 2 by RT PCR NEGATIVE NEGATIVE Final   Influenza A by PCR NEGATIVE NEGATIVE Final   Influenza B by PCR NEGATIVE NEGATIVE Final    Comment: (NOTE) The Xpert Xpress SARS-CoV-2/FLU/RSV plus assay is intended as an aid in the diagnosis of influenza from Nasopharyngeal swab specimens and should not be used as a sole basis for treatment. Nasal washings and aspirates are unacceptable for Xpert Xpress SARS-CoV-2/FLU/RSV testing.  Fact Sheet for Patients: EntrepreneurPulse.com.au  Fact Sheet for Healthcare Providers: IncredibleEmployment.be  This test is not yet approved or cleared by the [place] FDA and has been authorized for detection and/or diagnosis of SARS-CoV-2 by FDA under an Emergency Use Authorization (EUA). This EUA will remain in effect (meaning this test can be used) for the duration of the COVID-19 declaration under Section 564(b)(1) of the Act, 21 U.S.C. section 360bbb-3(b)(1), unless the authorization is terminated or revoked.     Resp Syncytial Virus by PCR NEGATIVE NEGATIVE Final  Comment: (NOTE) Fact Sheet for Patients: EntrepreneurPulse.com.au  Fact Sheet for Healthcare Providers: IncredibleEmployment.be  This test is not yet approved or cleared by the [place] FDA and has been authorized for detection and/or diagnosis of SARS-CoV-2 by FDA under an Emergency Use Authorization (EUA). This EUA will remain in effect (meaning this test can be used) for the duration of the COVID-19 declaration under Section 564(b)(1) of the Act, 21 U.S.C. section 360bbb-3(b)(1), unless the authorization is terminated or revoked.  Performed at [facility], [place] [address], [place], [place] [address]   Blood culture (routine x 2)     Status: None (Preliminary result)    Collection Time: 08/16/22  8:11 PM   Specimen: BLOOD  Result Value Ref Range Status   Specimen Description BLOOD SITE NOT SPECIFIED  Final   Special Requests   Final    BOTTLES DRAWN AEROBIC AND ANAEROBIC Blood Culture results may not be optimal due to an inadequate volume of blood received in culture bottles   Culture   Final    NO GROWTH 2 DAYS Performed at [facility], [place] [address] [place], [place] [address]    Report Status PENDING  Incomplete  Urine Culture     Status: Abnormal   Collection Time: 08/17/22 12:15 AM   Specimen: Urine, Clean Catch  Result Value Ref Range Status   Specimen Description URINE, CLEAN CATCH  Final   Special Requests   Final    NONE Performed at [facility], [address] [place], [place] [address]    Culture MULTIPLE SPECIES PRESENT, SUGGEST RECOLLECTION (A)  Final   Report Status 08/18/2022 FINAL  Final  MRSA Next Gen by PCR, Nasal     Status: None   Collection Time: 08/17/22 12:41 AM   Specimen: Nasal Mucosa; Nasal Swab  Result Value Ref Range Status   MRSA by PCR Next Gen NOT DETECTED NOT DETECTED Final    Comment: (NOTE) The GeneXpert MRSA Assay (FDA approved for NASAL specimens only), is one component of a comprehensive MRSA colonization surveillance program. It is not intended to diagnose MRSA infection nor to guide or monitor treatment for MRSA infections. Test performance is not FDA approved in patients less than 65 years old. Performed at [facility], [place] [address], [place], [place] [address]      Labs: Basic Metabolic Panel: Recent Labs  Lab 08/16/22 1213 08/16/22 1920 08/16/22 1932 08/17/22 0210 08/18/22 0224  NA 137 132* 133* 136 140  K 3.4* 4.0 4.1 3.4* 4.0  CL 97 96* 98 104 110  CO2 28 24  --  22 22  GLUCOSE 289* 293* 284* 217* 116*  BUN 10 7* 7* 7* 6*  CREATININE 1.04 1.13* 1.10* 1.03* 0.90  CALCIUM 9.2 7.9*  --  7.7* 7.9*  MG  --   --   --  1.6* 2.2  PHOS  --   --   --  3.7  --    Liver  Function Tests: Recent Labs  Lab 08/16/22 1213 08/16/22 1920 08/17/22 0210  AST '17 24 23  '$ ALT '17 19 17  '$ ALKPHOS 81 69 59  BILITOT 0.4 0.6 0.5  PROT 6.4 5.8* 5.2*  ALBUMIN 3.3* 2.3* 2.0*   CBC: Recent Labs  Lab 08/16/22 1213 08/16/22 1920 08/16/22 1932 08/17/22 0210 08/18/22 0224  WBC 8.6 12.8*  --  11.2* 8.3  NEUTROABS 6.8 11.6*  --   --   --   HGB 13.3  11.6* 12.2 10.7* 10.9*  HCT 40.7 36.3 36.0 33.1* 33.7*  MCV 86.2 87.5  --  86.6 86.6  PLT 289.0 224  --  213 234   CBG: Recent Labs  Lab 08/17/22 0723 08/17/22 1130 08/17/22 1724 08/17/22 2025 08/18/22 0846  GLUCAP 157* 140* 147* 103* 117*   Hgb A1c Recent Labs    08/16/22 1213  HGBA1C 8.4*   Lipid Profile Recent Labs    08/16/22 1213  CHOL 254*  HDL 47.10  TRIG 276.0*  CHOLHDL 5  LDLDIRECT 159.0   Thyroid function studies Recent Labs    08/16/22 1213  TSH 1.40   Urinalysis    Component Value Date/Time   COLORURINE YELLOW 08/17/2022 0013   APPEARANCEUR CLOUDY (A) 08/17/2022 0013   LABSPEC 1.024 08/17/2022 0013   PHURINE 6.0 08/17/2022 0013   GLUCOSEU NEGATIVE 08/17/2022 0013   HGBUR SMALL (A) 08/17/2022 0013   BILIRUBINUR NEGATIVE 08/17/2022 0013   BILIRUBINUR neg 08/06/2021 1612   KETONESUR 5 (A) 08/17/2022 0013   PROTEINUR NEGATIVE 08/17/2022 0013   UROBILINOGEN 0.2 08/06/2021 1612   NITRITE NEGATIVE 08/17/2022 0013   LEUKOCYTESUR LARGE (A) 08/17/2022 0013    FURTHER DISCHARGE INSTRUCTIONS:   Get Medicines reviewed and adjusted: Please take all your medications with you for your next visit with your Primary MD   Laboratory/radiological data: Please request your Primary MD to go over all hospital tests and procedure/radiological results at the follow up, please ask your Primary MD to get all Hospital records sent to his/her office.   In some cases, they will be blood work, cultures and biopsy results pending at the time of your discharge. Please request that your primary care M.D.  goes through all the records of your hospital data and follows up on these results.   Also Note the following: If you experience worsening of your admission symptoms, develop shortness of breath, life threatening emergency, suicidal or homicidal thoughts you must seek medical attention immediately by calling 911 or calling your MD immediately  if symptoms less severe.   You must read complete instructions/literature along with all the possible adverse reactions/side effects for all the Medicines you take and that have been prescribed to you. Take any new Medicines after you have completely understood and accpet all the possible adverse reactions/side effects.    Do not drive when taking Pain medications or sleeping medications (Benzodaizepines)   Do not take more than prescribed Pain, Sleep and Anxiety Medications. It is not advisable to combine anxiety,sleep and pain medications without talking with your primary care practitioner   Special Instructions: If you have smoked or chewed Tobacco  in the last 2 yrs please stop smoking, stop any regular Alcohol  and or any Recreational drug use.   Wear Seat belts while driving.   Please note: You were cared for by a hospitalist during your hospital stay. Once you are discharged, your primary care physician will handle any further medical issues. Please note that NO REFILLS for any discharge medications will be authorized once you are discharged, as it is imperative that you return to your primary care physician (or establish a relationship with a primary care physician if you do not have one) for your post hospital discharge needs so that they can reassess your need for medications and monitor your lab values.  Time coordinating discharge: 35 minutes  SIGNED:  Marzetta Board, MD, PhD 08/18/2022, 11:18 AM

## 2022-08-18 NOTE — Progress Notes (Signed)
Mobility Specialist - Progress Note   08/18/22 1015  Mobility  Activity Ambulated independently in room  Level of Assistance Independent  Assistive Device None  Distance Ambulated (ft) 10 ft  Activity Response Tolerated well  Mobility Referral Yes  $Mobility charge 1 Mobility   Pt was received in bed and agreeable to mobility. No complaints throughout session. Pt was returned to bed with all needs met.   Franki Monte  Mobility Specialist Please contact via Solicitor or Rehab office at [phone]

## 2022-08-18 NOTE — TOC Progression Note (Signed)
Transition of Care [facility]) - Progression Note    Patient Details  Name: Debra Leach MRN: [id number] Date of Birth: 1950/08/25  Transition of Care [facility]) CM/SW [place], RN Phone Number: 08/18/2022, 12:23 PM  Clinical Narrative:     Transition of Care [facility]) Screening Note   Patient Details  Name: Debra Leach Date of Birth: 04-30-1951   Transition of Care [facility]) CM/SW Contact:    Curlene Labrum, RN Phone Number: 08/18/2022, 12:23 PM    Transition of Care Department [facility]) has reviewed patient and no TOC needs have been identified at this time.   We will continue to monitor patient advancement through interdisciplinary progression rounds. If new patient transition needs arise, please place a TOC consult.          Expected Discharge Plan and Services         Expected Discharge Date: 08/18/22                                     Social Determinants of Health (SDOH) Interventions SDOH Screenings   Food Insecurity: No Food Insecurity (08/17/2022)  Housing: Low Risk  (08/17/2022)  Transportation Needs: No Transportation Needs (08/17/2022)  Utilities: Not At Risk (08/17/2022)  Depression (PHQ2-9): Low Risk  (05/11/2022)  Financial Resource Strain: Low Risk  (11/20/2019)  Physical Activity: Inactive (04/06/2019)  Stress: No Stress Concern Present (04/06/2019)  Tobacco Use: Medium Risk (08/17/2022)    Readmission Risk Interventions     No data to display

## 2022-08-19 ENCOUNTER — Other Ambulatory Visit: Payer: Self-pay

## 2022-08-19 ENCOUNTER — Inpatient Hospital Stay: Payer: Medicare Other

## 2022-08-19 ENCOUNTER — Emergency Department: Payer: Medicare Other

## 2022-08-19 ENCOUNTER — Telehealth: Payer: Self-pay

## 2022-08-19 ENCOUNTER — Encounter: Payer: Self-pay | Admitting: Emergency Medicine

## 2022-08-19 ENCOUNTER — Inpatient Hospital Stay
Admission: EM | Admit: 2022-08-19 | Discharge: 2022-08-24 | DRG: 872 | Disposition: A | Discharge status: Home / Self Care | Payer: Medicare Other | Attending: Student | Admitting: Student

## 2022-08-19 DIAGNOSIS — G43109 Migraine with aura, not intractable, without status migrainosus: Secondary | ICD-10-CM | POA: Diagnosis present

## 2022-08-19 DIAGNOSIS — E1169 Type 2 diabetes mellitus with other specified complication: Secondary | ICD-10-CM | POA: Diagnosis present

## 2022-08-19 DIAGNOSIS — K861 Other chronic pancreatitis: Secondary | ICD-10-CM | POA: Diagnosis present

## 2022-08-19 DIAGNOSIS — Z8249 Family history of ischemic heart disease and other diseases of the circulatory system: Secondary | ICD-10-CM | POA: Diagnosis not present

## 2022-08-19 DIAGNOSIS — Z79624 Long term (current) use of inhibitors of nucleotide synthesis: Secondary | ICD-10-CM

## 2022-08-19 DIAGNOSIS — L03116 Cellulitis of left lower limb: Secondary | ICD-10-CM | POA: Diagnosis present

## 2022-08-19 DIAGNOSIS — D8989 Other specified disorders involving the immune mechanism, not elsewhere classified: Secondary | ICD-10-CM | POA: Diagnosis present

## 2022-08-19 DIAGNOSIS — Z7952 Long term (current) use of systemic steroids: Secondary | ICD-10-CM | POA: Diagnosis not present

## 2022-08-19 DIAGNOSIS — Z8261 Family history of arthritis: Secondary | ICD-10-CM

## 2022-08-19 DIAGNOSIS — Z79899 Other long term (current) drug therapy: Secondary | ICD-10-CM

## 2022-08-19 DIAGNOSIS — N1831 Chronic kidney disease, stage 3a: Secondary | ICD-10-CM | POA: Insufficient documentation

## 2022-08-19 DIAGNOSIS — Z794 Long term (current) use of insulin: Secondary | ICD-10-CM

## 2022-08-19 DIAGNOSIS — A419 Sepsis, unspecified organism: Secondary | ICD-10-CM | POA: Diagnosis present

## 2022-08-19 DIAGNOSIS — Z86718 Personal history of other venous thrombosis and embolism: Secondary | ICD-10-CM | POA: Diagnosis not present

## 2022-08-19 DIAGNOSIS — E876 Hypokalemia: Secondary | ICD-10-CM | POA: Diagnosis present

## 2022-08-19 DIAGNOSIS — K219 Gastro-esophageal reflux disease without esophagitis: Secondary | ICD-10-CM | POA: Diagnosis present

## 2022-08-19 DIAGNOSIS — T368X5A Adverse effect of other systemic antibiotics, initial encounter: Secondary | ICD-10-CM | POA: Diagnosis not present

## 2022-08-19 DIAGNOSIS — Z7901 Long term (current) use of anticoagulants: Secondary | ICD-10-CM

## 2022-08-19 DIAGNOSIS — I129 Hypertensive chronic kidney disease with stage 1 through stage 4 chronic kidney disease, or unspecified chronic kidney disease: Secondary | ICD-10-CM | POA: Diagnosis present

## 2022-08-19 DIAGNOSIS — Z86711 Personal history of pulmonary embolism: Secondary | ICD-10-CM | POA: Diagnosis present

## 2022-08-19 DIAGNOSIS — N390 Urinary tract infection, site not specified: Secondary | ICD-10-CM | POA: Diagnosis present

## 2022-08-19 DIAGNOSIS — R6520 Severe sepsis without septic shock: Secondary | ICD-10-CM | POA: Diagnosis present

## 2022-08-19 DIAGNOSIS — Z87891 Personal history of nicotine dependence: Secondary | ICD-10-CM | POA: Diagnosis not present

## 2022-08-19 DIAGNOSIS — E1122 Type 2 diabetes mellitus with diabetic chronic kidney disease: Secondary | ICD-10-CM

## 2022-08-19 DIAGNOSIS — Z880 Allergy status to penicillin: Secondary | ICD-10-CM

## 2022-08-19 DIAGNOSIS — R220 Localized swelling, mass and lump, head: Secondary | ICD-10-CM | POA: Diagnosis not present

## 2022-08-19 DIAGNOSIS — E2740 Unspecified adrenocortical insufficiency: Secondary | ICD-10-CM | POA: Diagnosis present

## 2022-08-19 DIAGNOSIS — Z9842 Cataract extraction status, left eye: Secondary | ICD-10-CM

## 2022-08-19 DIAGNOSIS — Z7969 Long term (current) use of other immunomodulators and immunosuppressants: Secondary | ICD-10-CM

## 2022-08-19 DIAGNOSIS — Z825 Family history of asthma and other chronic lower respiratory diseases: Secondary | ICD-10-CM

## 2022-08-19 DIAGNOSIS — Z885 Allergy status to narcotic agent status: Secondary | ICD-10-CM

## 2022-08-19 DIAGNOSIS — E785 Hyperlipidemia, unspecified: Secondary | ICD-10-CM | POA: Diagnosis present

## 2022-08-19 DIAGNOSIS — Z9841 Cataract extraction status, right eye: Secondary | ICD-10-CM

## 2022-08-19 DIAGNOSIS — Z888 Allergy status to other drugs, medicaments and biological substances status: Secondary | ICD-10-CM

## 2022-08-19 LAB — CBC WITH DIFFERENTIAL/PLATELET
Abs Immature Granulocytes: 0.12 10*3/uL — ABNORMAL HIGH (ref 0.00–0.07)
Basophils Absolute: 0.1 10*3/uL (ref 0.0–0.1)
Basophils Relative: 0 %
Eosinophils Absolute: 0.1 10*3/uL (ref 0.0–0.5)
Eosinophils Relative: 0 %
HCT: 41.1 % (ref 36.0–46.0)
Hemoglobin: 12.6 g/dL (ref 12.0–15.0)
Immature Granulocytes: 1 %
Lymphocytes Relative: 5 %
Lymphs Abs: 0.9 10*3/uL (ref 0.7–4.0)
MCH: 26.9 pg (ref 26.0–34.0)
MCHC: 30.7 g/dL (ref 30.0–36.0)
MCV: 87.8 fL (ref 80.0–100.0)
Monocytes Absolute: 0.4 10*3/uL (ref 0.1–1.0)
Monocytes Relative: 2 %
Neutro Abs: 18.8 10*3/uL — ABNORMAL HIGH (ref 1.7–7.7)
Neutrophils Relative %: 92 %
Platelets: 372 10*3/uL (ref 150–400)
RBC: 4.68 MIL/uL (ref 3.87–5.11)
RDW: 18.3 % — ABNORMAL HIGH (ref 11.5–15.5)
WBC: 20.4 10*3/uL — ABNORMAL HIGH (ref 4.0–10.5)
nRBC: 0.1 % (ref 0.0–0.2)

## 2022-08-19 LAB — COMPREHENSIVE METABOLIC PANEL
ALT: 29 U/L (ref 0–44)
AST: 52 U/L — ABNORMAL HIGH (ref 15–41)
Albumin: 2.9 g/dL — ABNORMAL LOW (ref 3.5–5.0)
Alkaline Phosphatase: 95 U/L (ref 38–126)
Anion gap: 12 (ref 5–15)
BUN: 5 mg/dL — ABNORMAL LOW (ref 8–23)
CO2: 27 mmol/L (ref 22–32)
Calcium: 8.5 mg/dL — ABNORMAL LOW (ref 8.9–10.3)
Chloride: 102 mmol/L (ref 98–111)
Creatinine, Ser: 1.1 mg/dL — ABNORMAL HIGH (ref 0.44–1.00)
GFR, Estimated: 54 mL/min — ABNORMAL LOW (ref >60)
Glucose, Bld: 115 mg/dL — ABNORMAL HIGH (ref 70–99)
Potassium: 3.6 mmol/L (ref 3.5–5.1)
Sodium: 141 mmol/L (ref 135–145)
Total Bilirubin: 0.6 mg/dL (ref 0.3–1.2)
Total Protein: 7.1 g/dL (ref 6.5–8.1)

## 2022-08-19 LAB — LACTIC ACID, PLASMA
Lactic Acid, Venous: 2.4 mmol/L (ref 0.5–1.9)
Lactic Acid, Venous: 1.1 mmol/L (ref 0.5–1.9)

## 2022-08-19 LAB — PROTIME-INR
INR: 1.3 — ABNORMAL HIGH (ref 0.8–1.2)
Prothrombin Time: 15.8 seconds — ABNORMAL HIGH (ref 11.4–15.2)

## 2022-08-19 MED ORDER — ACETAMINOPHEN 325 MG PO TABS
650.0000 mg | ORAL_TABLET | Freq: Four times a day (QID) | ORAL | Status: DC | PRN
  Administered 2022-08-19 – 2022-08-23 (×10): 650 mg via ORAL
  Filled 2022-08-19 (×10): qty 2

## 2022-08-19 MED ORDER — LACTATED RINGERS IV BOLUS (SEPSIS)
1000.0000 mL | Freq: Once | INTRAVENOUS | Status: AC
  Administered 2022-08-19: 1000 mL via INTRAVENOUS

## 2022-08-19 MED ORDER — VANCOMYCIN HCL 1500 MG/300ML IV SOLN
1500.0000 mg | Freq: Once | INTRAVENOUS | Status: AC
  Administered 2022-08-19: 1500 mg via INTRAVENOUS
  Filled 2022-08-19: qty 300

## 2022-08-19 MED ORDER — METRONIDAZOLE 500 MG/100ML IV SOLN
500.0000 mg | Freq: Two times a day (BID) | INTRAVENOUS | Status: DC
  Administered 2022-08-20 (×2): 500 mg via INTRAVENOUS
  Filled 2022-08-19 (×3): qty 100

## 2022-08-19 MED ORDER — SODIUM CHLORIDE 0.9 % IV SOLN
2.0000 g | Freq: Once | INTRAVENOUS | Status: AC
  Administered 2022-08-19: 2 g via INTRAVENOUS
  Filled 2022-08-19: qty 12.5

## 2022-08-19 MED ORDER — ONDANSETRON HCL 4 MG PO TABS
4.0000 mg | ORAL_TABLET | Freq: Four times a day (QID) | ORAL | Status: DC | PRN
  Administered 2022-08-23: 4 mg via ORAL
  Filled 2022-08-19: qty 1

## 2022-08-19 MED ORDER — INSULIN ASPART 100 UNIT/ML IJ SOLN
0.0000 [IU] | Freq: Three times a day (TID) | INTRAMUSCULAR | Status: DC
  Administered 2022-08-20 – 2022-08-22 (×3): 2 [IU] via SUBCUTANEOUS
  Administered 2022-08-22: 3 [IU] via SUBCUTANEOUS
  Administered 2022-08-23: 2 [IU] via SUBCUTANEOUS
  Filled 2022-08-19 (×6): qty 1

## 2022-08-19 MED ORDER — GABAPENTIN 100 MG PO CAPS
100.0000 mg | ORAL_CAPSULE | Freq: Three times a day (TID) | ORAL | Status: DC | PRN
  Administered 2022-08-20 – 2022-08-22 (×3): 100 mg via ORAL
  Filled 2022-08-19 (×3): qty 1

## 2022-08-19 MED ORDER — INSULIN GLARGINE-YFGN 100 UNIT/ML ~~LOC~~ SOLN
15.0000 [IU] | Freq: Every day | SUBCUTANEOUS | Status: DC
  Administered 2022-08-20 – 2022-08-21 (×2): 15 [IU] via SUBCUTANEOUS
  Filled 2022-08-19 (×2): qty 0.15

## 2022-08-19 MED ORDER — VANCOMYCIN HCL 750 MG/150ML IV SOLN: 750.0000 mg | INTRAVENOUS | Status: DC

## 2022-08-19 MED ORDER — LACTATED RINGERS IV SOLN
150.0000 mL/h | INTRAVENOUS | Status: DC
  Administered 2022-08-19 – 2022-08-20 (×3): 150 mL/h via INTRAVENOUS

## 2022-08-19 MED ORDER — POTASSIUM CHLORIDE CRYS ER 20 MEQ PO TBCR
20.0000 meq | EXTENDED_RELEASE_TABLET | Freq: Every day | ORAL | Status: DC
  Filled 2022-08-19 (×2): qty 1

## 2022-08-19 MED ORDER — LACTATED RINGERS IV BOLUS (SEPSIS)
250.0000 mL | Freq: Once | INTRAVENOUS | Status: AC
  Administered 2022-08-19: 250 mL via INTRAVENOUS

## 2022-08-19 MED ORDER — ACETAMINOPHEN 650 MG RE SUPP: 650.0000 mg | Freq: Four times a day (QID) | RECTAL | Status: DC | PRN

## 2022-08-19 MED ORDER — ATORVASTATIN CALCIUM 20 MG PO TABS
80.0000 mg | ORAL_TABLET | Freq: Every day | ORAL | Status: DC
  Administered 2022-08-20 – 2022-08-23 (×4): 80 mg via ORAL
  Filled 2022-08-19 (×4): qty 4

## 2022-08-19 MED ORDER — MORPHINE SULFATE (PF) 2 MG/ML IV SOLN: 2.0000 mg | INTRAVENOUS | Status: DC | PRN

## 2022-08-19 MED ORDER — APIXABAN 2.5 MG PO TABS
2.5000 mg | ORAL_TABLET | Freq: Two times a day (BID) | ORAL | Status: DC
  Administered 2022-08-19 – 2022-08-24 (×10): 2.5 mg via ORAL
  Filled 2022-08-19 (×11): qty 1

## 2022-08-19 MED ORDER — INSULIN ASPART 100 UNIT/ML IJ SOLN: 0.0000 [IU] | Freq: Every day | INTRAMUSCULAR | Status: DC

## 2022-08-19 MED ORDER — METRONIDAZOLE 500 MG/100ML IV SOLN
500.0000 mg | Freq: Once | INTRAVENOUS | Status: AC
  Administered 2022-08-19: 500 mg via INTRAVENOUS
  Filled 2022-08-19: qty 100

## 2022-08-19 MED ORDER — REPAGLINIDE 1 MG PO TABS
1.0000 mg | ORAL_TABLET | Freq: Every day | ORAL | Status: DC
  Filled 2022-08-19 (×4): qty 1

## 2022-08-19 MED ORDER — VANCOMYCIN HCL IN DEXTROSE 1-5 GM/200ML-% IV SOLN: 1000.0000 mg | Freq: Once | INTRAVENOUS | Status: DC

## 2022-08-19 MED ORDER — SODIUM CHLORIDE 0.9 % IV SOLN
2.0000 g | Freq: Two times a day (BID) | INTRAVENOUS | Status: DC
  Administered 2022-08-20 – 2022-08-24 (×9): 2 g via INTRAVENOUS
  Filled 2022-08-19: qty 12.5
  Filled 2022-08-19 (×2): qty 2
  Filled 2022-08-19 (×3): qty 12.5
  Filled 2022-08-19 (×3): qty 2
  Filled 2022-08-19: qty 12.5

## 2022-08-19 MED ORDER — ACETAMINOPHEN 325 MG PO TABS: 650.0000 mg | ORAL_TABLET | Freq: Once | ORAL | Status: DC

## 2022-08-19 MED ORDER — ACETAMINOPHEN 325 MG PO TABS
650.0000 mg | ORAL_TABLET | Freq: Once | ORAL | Status: AC
  Administered 2022-08-19: 650 mg via ORAL
  Filled 2022-08-19: qty 2

## 2022-08-19 MED ORDER — BUDESONIDE 3 MG PO CPEP: 6.0000 mg | ORAL_CAPSULE | Freq: Every day | ORAL | Status: DC

## 2022-08-19 MED ORDER — AZATHIOPRINE 50 MG PO TABS: 50.0000 mg | ORAL_TABLET | Freq: Every day | ORAL | Status: DC

## 2022-08-19 MED ORDER — GABAPENTIN 300 MG PO CAPS
600.0000 mg | ORAL_CAPSULE | Freq: Every day | ORAL | Status: DC
  Administered 2022-08-19 – 2022-08-23 (×5): 600 mg via ORAL
  Filled 2022-08-19 (×5): qty 2

## 2022-08-19 MED ORDER — ONDANSETRON HCL 4 MG/2ML IJ SOLN
4.0000 mg | Freq: Four times a day (QID) | INTRAMUSCULAR | Status: DC | PRN
  Administered 2022-08-19 – 2022-08-21 (×4): 4 mg via INTRAVENOUS
  Filled 2022-08-19 (×4): qty 2

## 2022-08-19 NOTE — Assessment & Plan Note (Addendum)
Long-term current use of immunosuppressive medication Patient is on long-term budesonide and azathioprine Will hold in the setting of acute infection

## 2022-08-19 NOTE — Assessment & Plan Note (Addendum)
Sepsis criteria includes fever of 103.2, tachycardia of 122 and tachypnea of 23 with WBC 20,000 up from 8300 on discharge the day prior with lactic acid 2.4 Patient presented with nausea and vomiting Source is uncertain.  Blood cultures from 3/11 negative, respiratory viral panel was negative and urinalysis showed multiple species Chest x-ray clear Follow blood cultures, recollect urine for culture Will continue broad-spectrum antibiotics of cefepime metronidazole and Flagyl for sepsis of unknown source Hold immunosuppressants IV fluid resuscitation for sepsis Will get CT abdomen and pelvis---->non acute

## 2022-08-19 NOTE — ED Triage Notes (Signed)
Patient to ED via [facility] from home for N/V. Patient states she was released from hospital yesterday after having UTI. Today has vomiting twice and having nausea. Last took tylenol around 1500.  99.7 120 HR 140/82 98% 22rr

## 2022-08-19 NOTE — H&P (Signed)
History and Physical    Patient: Debra Leach [id number] DOB: [birth year] DOA: 08/19/2022 DOS: the patient was seen and examined on 08/19/2022 PCP: [name], MD  Patient coming from: Home  Chief Complaint:  Chief Complaint  Patient presents with   Emesis    HPI: Debra Leach is a 72 y.o. 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  Urine culture from 3/13 showed multiple species with recommendation for recollection.  Blood cultures from 3/11 were negative, respiratory viral panel from 3/11 was also negative. ED course and data review: Tmax 103.2 with pulse 122, respirations 23.  Labs notable for WBC 20,400, up from 8.3 on discharge with lactic acid 2.4, up from 1.6, 3 days ago.  Creatinine 1.1 up from baseline of 0.9 when discharged a day prior. Chest x-ray nonacute Patient started on sepsis protocol with IV fluid bolus, cefepime metronidazole and Flagyl for sepsis of unknown source.  Hospitalist consulted for admission.   Review of Systems: As mentioned in the history of present illness. All other systems reviewed and are negative.  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HCC)    Clotting disorder (HCC)    Complication of anesthesia    difficult waking   Diabetes mellitus without complication (HCC)    Diet and exercise controlled   Diverticulosis    DVT (deep venous thrombosis) ([place]) 02/2019   3calf 2 right lung 1 left lung   DVT (deep venous thrombosis) ([place])  2020   right leg   Dyspnea    GERD (gastroesophageal reflux disease)    Headache    Hepatic steatosis    Hiatal hernia    History of kidney stones    Hyperlipidemia    Hypertension    06/29/22 has not had to take blood pressure meds in over 1 year.   MVP (mitral valve prolapse)    history of   PONV (postoperative nausea and vomiting)    Pulmonary emboli ([place]) 03/01/2019   2 in the right; 1 in the left   Past Surgical History:  Procedure Laterality Date   arm surgery  2012   left forearm fracture w/ plates pins screws   ARTERY BIOPSY Right 12/03/2021   Procedure: BIOPSY TEMPORAL ARTERY;  Surgeon: Algernon Huxley, MD;  Location: [facility];  Service: Vascular;  Laterality: Right;  Right temple   BREAST BIOPSY Left    BREAST SURGERY  2000   breast biopsy (benign)   CATARACT EXTRACTION Right 2005   CATARACT EXTRACTION Left 2010   LITHOTRIPSY  08/12/2008   stent placed bilaterally   PANCREAS BIOPSY  2021   PANCREAS BIOPSY  2019   PARTIAL HYSTERECTOMY     Both ovaries remain, vaginal, for mennorhagia   REPAIR EXTENSOR TENDON Right 06/30/2022   Procedure: Middle and ring finger sagittal band reconstruction;  Surgeon: Sherilyn Cooter, MD;  Location: [facility];  Service: Orthopedics;  Laterality: Right;   TONSILLECTOMY     TUBAL LIGATION  1980   Social History:  reports that she  quit smoking about 31 years ago. Her smoking use included cigarettes. She has a 13.00 pack-year smoking history. She has never been exposed to tobacco smoke. She has never used smokeless tobacco. She reports that she does not drink alcohol and does not use drugs.  Allergies  Allergen Reactions   Sudafed [Pseudoephedrine Hcl] Hives, Itching and Anxiety   Atenolol Other (See Comments)    Migraine   Codeine Itching and Swelling    REACTION: Migraine   Epinephrine Hives   Hydrocodone Other (See Comments)    Migraine   Ibuprofen Other (See Comments)    Migraine   Jardiance [Empagliflozin] Other (See Comments)     Caused uti Yeast infection   Oxycodone Other (See Comments)    Migraine   Penicillins Itching and Swelling    ** Tolerates cephalosporins Has patient had a PCN reaction causing immediate rash, facial/tongue/throat swelling, SOB or lightheadedness with hypotension: No Has patient had a PCN reaction causing severe rash involving mucus membranes or skin necrosis: No Has patient had a PCN reaction that required hospitalization: No Has patient had a PCN reaction occurring within the last 10 years: No     Pentazocine Lactate     REACTION: Swelling, itching, rash   Promethazine Other (See Comments)    migraine   Propranolol Other (See Comments)    Migraine   Talwin [Pentazocine] Other (See Comments)    Swelling and itching    Tramadol Other (See Comments)    Migraine   Crestor [Rosuvastatin] Other (See Comments)    Muscle pain    Family History  Problem Relation Age of Onset   Cancer Mother        bone   Hypertension Father    Mitral valve prolapse Father    Asthma Brother    Arthritis Brother    Nephrolithiasis Brother    Asthma Brother    COPD Brother    Breast cancer Maternal Aunt    Breast cancer Maternal Aunt    Colon cancer Neg Hx    Esophageal cancer Neg Hx    Inflammatory bowel disease Neg Hx    Liver disease Neg Hx    Pancreatic cancer Neg Hx    Rectal cancer Neg Hx    Stomach cancer Neg Hx     Prior to Admission medications   Medication Sig Start Date End Date Taking? Authorizing Provider  acetaminophen (TYLENOL) 500 MG tablet Take 500-1,000 mg by mouth every 6 (six) hours as needed for moderate pain or fever.    [provider]  atorvastatin (LIPITOR) 80 MG tablet Take 1 tablet (80 mg total) by mouth at bedtime. 11/03/21   Jennye Boroughs, MD  azaTHIOprine (IMURAN) 50 MG tablet Take 1 tablet (50 mg total) by mouth daily. 05/04/22   Mansouraty, Telford Nab., MD  Blood Glucose Monitoring Suppl (ONETOUCH VERIO REFLECT) w/Device KIT TEST TWICE A DAY 07/06/22    Bedsole, Amy E, MD  budesonide (ENTOCORT EC) 3 MG 24 hr capsule Take 2 capsules (6 mg total) by mouth daily. 02/25/22   Mansouraty, Telford Nab., MD  cefdinir (OMNICEF) 300 MG capsule Take 1 capsule (300 mg total) by mouth 2 (two) times daily for 5 days. 08/18/22 08/23/22  Caren Griffins, MD  Cholecalciferol (VITAMIN D3) 1.25 MG (50000 UT) CAPS Take 1 capsule by mouth once a week. 08/20/21   Bedsole, Amy E, MD  colesevelam (WELCHOL) 625 MG tablet TAKE 3 TABLETS BY MOUTH 2 TIMES DAILY WITH A MEAL. Patient taking differently:  Take 1,875 mg by mouth 2 (two) times daily with a meal. 08/31/21   Bedsole, Amy E, MD  Continuous Blood Gluc Sensor (DEXCOM G7 SENSOR) MISC 1 Device by Does not apply route as directed. 06/08/22   Shamleffer, Melanie Crazier, MD  ELIQUIS 2.5 MG TABS tablet TAKE 1 TABLET BY MOUTH 2 TIMES DAILY. Patient taking differently: Take 2.5 mg by mouth 2 (two) times daily. 06/13/22   Bedsole, Amy E, MD  Eptinezumab-jjmr (VYEPTI) 100 MG/ML injection Inject 300 mg into the vein every 3 (three) months.    [provider]  ferrous gluconate (FERGON) 324 MG tablet Take 1 tablet (324 mg total) by mouth daily with breakfast. 02/15/22   Mansouraty, Telford Nab., MD  gabapentin (NEURONTIN) 100 MG capsule TAKE 1 CAPSULE BY MOUTH UP TO 3 TIMES DAILY AS NEEDED. Patient taking differently: Take 100 mg by mouth 3 (three) times daily as needed (Neuropathic pain). 10/07/21   Jaffe, Adam R, DO  gabapentin (NEURONTIN) 300 MG capsule TAKE 2 CAPSULES BY MOUTH AT BEDTIME. Patient taking differently: Take 600 mg by mouth at bedtime. 06/14/22   Tomi Likens, Adam R, DO  insulin glargine (LANTUS SOLOSTAR) 100 UNIT/ML Solostar Pen Inject 18 Units into the skin daily.    [provider]  Lancets (ONETOUCH DELICA PLUS [id number]) [place] USE TO CHECK BLOOD SUGAR TWO TIMES DAILY 05/08/21   Bedsole, Amy E, MD  omeprazole (PRILOSEC) 20 MG capsule Take 20 mg by mouth daily with supper. 06/11/22   [provider]   ondansetron (ZOFRAN) 4 MG tablet Take 1 tablet (4 mg total) by mouth every 8 (eight) hours as needed for nausea or vomiting. 02/12/22   Mansouraty, Telford Nab., MD  ONETOUCH VERIO test strip USE TO CHECK BLOOD SUGAR TWO TIMES A DAY 07/06/22   Bedsole, Amy E, MD  potassium chloride SA (KLOR-CON M) 20 MEQ tablet TAKE 1 TABLET BY MOUTH DAILY. Patient taking differently: Take 20 mEq by mouth daily. 03/15/22   Bedsole, Amy E, MD  repaglinide (PRANDIN) 0.5 MG tablet Take 2 tablets (1 mg total) by mouth daily before lunch. before breakfast and lunch Patient taking differently: Take 1 mg by mouth daily before lunch. 05/13/22   Shamleffer, Melanie Crazier, MD  UBRELVY 100 MG TABS TAKE 1 TABLET BY MOUTH AS NEEDED (MAY REPEAT DOSE AFTER 2 HOURS IF NEEDED. MAXIMUM 2 TABLETS IN 24 HOURS). 03/27/20   Pieter Partridge, DO  vitamin B-12 (CYANOCOBALAMIN) 1000 MCG tablet Take 1,000 mcg by mouth daily with breakfast. 08/15/20   [provider]  Vitamin D, Ergocalciferol, (DRISDOL) 1.25 MG (50000 UNIT) CAPS capsule TAKE 1 CAPSULE BY MOUTH ONCE A WEEK. 07/20/22   [name], MD    Physical Exam: Vitals:   08/19/22 1716 08/19/22 1731 08/19/22 1800 08/19/22 1916  BP: 110/67  135/65   Pulse: (!) 122  (!) 121   Resp: 18  (!) 23   Temp: (!) 103.2 F (39.6 C)   (!) 103 F (39.4 C)  TempSrc: Oral   Oral  SpO2: 100%  96%   Weight:  66.9 kg    Height:  5' (1.524 m)     Physical Exam Vitals and nursing note reviewed.  Constitutional:      General: She is not in acute distress. HENT:     Head: Normocephalic and atraumatic.  Cardiovascular:     Rate and Rhythm: Regular rhythm. Tachycardia present.     Heart sounds: Normal heart sounds.  Pulmonary:  Effort: Tachypnea present.     Breath sounds: Normal breath sounds.  Abdominal:     Palpations: Abdomen is soft.     Tenderness: There is no abdominal tenderness.  Musculoskeletal:     Comments: Splint on right wrist from recent surgery  Neurological:      Mental Status: Mental status is at baseline.     Labs on Admission: I have personally reviewed following labs and imaging studies  CBC: Recent Labs  Lab 08/16/22 1213 08/16/22 1920 08/16/22 1932 08/17/22 0210 08/18/22 0224 08/19/22 1719  WBC 8.6 12.8*  --  11.2* 8.3 20.4*  NEUTROABS 6.8 11.6*  --   --   --  18.8*  HGB 13.3 11.6* 12.2 10.7* 10.9* 12.6  HCT 40.7 36.3 36.0 33.1* 33.7* 41.1  MCV 86.2 87.5  --  86.6 86.6 87.8  PLT 289.0 224  --  213 234 937   Basic Metabolic Panel: Recent Labs  Lab 08/16/22 1213 08/16/22 1920 08/16/22 1932 08/17/22 0210 08/18/22 0224 08/19/22 1719  NA 137 132* 133* 136 140 141  K 3.4* 4.0 4.1 3.4* 4.0 3.6  CL 97 96* 98 104 110 102  CO2 28 24  --  22 22 27   GLUCOSE 289* 293* 284* 217* 116* 115*  BUN 10 7* 7* 7* 6* 5*  CREATININE 1.04 1.13* 1.10* 1.03* 0.90 1.10*  CALCIUM 9.2 7.9*  --  7.7* 7.9* 8.5*  MG  --   --   --  1.6* 2.2  --   PHOS  --   --   --  3.7  --   --    GFR: Estimated Creatinine Clearance: 40.1 mL/min (A) (by C-G formula based on SCr of 1.1 mg/dL (H)). Liver Function Tests: Recent Labs  Lab 08/16/22 1213 08/16/22 1920 08/17/22 0210 08/19/22 1719  AST 17 24 23  52*  ALT 17 19 17 29   ALKPHOS 81 69 59 95  BILITOT 0.4 0.6 0.5 0.6  PROT 6.4 5.8* 5.2* 7.1  ALBUMIN 3.3* 2.3* 2.0* 2.9*   No results for input(s): "LIPASE", "AMYLASE" in the last 168 hours. No results for input(s): "AMMONIA" in the last 168 hours. Coagulation Profile: Recent Labs  Lab 08/16/22 1920 08/19/22 1719  INR 1.3* 1.3*   Cardiac Enzymes: No results for input(s): "CKTOTAL", "CKMB", "CKMBINDEX", "TROPONINI" in the last 168 hours. BNP (last 3 results) No results for input(s): "PROBNP" in the last 8760 hours. HbA1C: No results for input(s): "HGBA1C" in the last 72 hours. CBG: Recent Labs  Lab 08/17/22 1130 08/17/22 1724 08/17/22 2025 08/18/22 0846 08/18/22 1203  GLUCAP 140* 147* 103* 117* 202*   Lipid Profile: No results for  input(s): "CHOL", "HDL", "LDLCALC", "TRIG", "CHOLHDL", "LDLDIRECT" in the last 72 hours. Thyroid Function Tests: No results for input(s): "TSH", "T4TOTAL", "FREET4", "T3FREE", "THYROIDAB" in the last 72 hours. Anemia Panel: No results for input(s): "VITAMINB12", "FOLATE", "FERRITIN", "TIBC", "IRON", "RETICCTPCT" in the last 72 hours. Urine analysis:    Component Value Date/Time   COLORURINE YELLOW 08/17/2022 0013   APPEARANCEUR CLOUDY (A) 08/17/2022 0013   LABSPEC 1.024 08/17/2022 0013   PHURINE 6.0 08/17/2022 0013   GLUCOSEU NEGATIVE 08/17/2022 0013   HGBUR SMALL (A) 08/17/2022 0013   BILIRUBINUR NEGATIVE 08/17/2022 0013   BILIRUBINUR neg 08/06/2021 1612   KETONESUR 5 (A) 08/17/2022 0013   PROTEINUR NEGATIVE 08/17/2022 0013   UROBILINOGEN 0.2 08/06/2021 1612   NITRITE NEGATIVE 08/17/2022 0013   LEUKOCYTESUR LARGE (A) 08/17/2022 0013    Radiological Exams on Admission: DG  Chest Port 1 View  Result Date: 08/19/2022 CLINICAL DATA:  Sepsis, UTI, nausea, and vomiting EXAM: PORTABLE CHEST 1 VIEW COMPARISON:  Portable exam 1744 hours compared to 08/16/2022 FINDINGS: Chronic elevation of RIGHT diaphragm. Normal heart size and pulmonary vascularity. Small hiatal hernia. Mild RIGHT basilar atelectasis. Lungs otherwise clear. No infiltrate, pleural effusion, or pneumothorax. Atherosclerotic calcifications at aortic arch. No acute osseous findings. IMPRESSION: Small hiatal hernia. RIGHT basilar atelectasis. Aortic Atherosclerosis (ICD10-I70.0). Electronically Signed   By: Ulyses Southward M.D.   On: 08/19/2022 18:03     Data Reviewed: Relevant notes from primary care and specialist visits, past discharge summaries as available in EHR, including Care Everywhere. Prior diagnostic testing as pertinent to current admission diagnoses Updated medications and problem lists for reconciliation ED course, including vitals, labs, imaging, treatment and response to treatment Triage notes, nursing and pharmacy  notes and ED provider's notes Notable results as noted in HPI   Assessment and Plan: * Sepsis (HCC) Sepsis criteria includes fever of 103.2, tachycardia of 122 and tachypnea of 23 with WBC 20,000 up from 8300 on discharge the day prior with lactic acid 2.4 Patient presented with nausea and vomiting Source is uncertain.  Blood cultures from 3/11 negative, respiratory viral panel was negative and urinalysis showed multiple species Chest x-ray clear Follow blood cultures, recollect urine for culture Will continue broad-spectrum antibiotics of cefepime metronidazole and Flagyl for sepsis of unknown source Hold immunosuppressants IV fluid resuscitation for sepsis Will get CT abdomen and pelvis---->non acute  Chronic kidney disease, stage 3a (HCC) Renal function at baseline  Type 2 diabetes mellitus with stage 3a chronic kidney disease, with long-term current use of insulin (HCC) Continue basal insulin Sliding scale insulin coverage Continue Prandin  History of pulmonary embolism Continue Eliquis  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HCC) Long-term current use of immunosuppressive medication Patient is on long-term budesonide and azathioprine Will hold in the setting of acute infection  Migraine with aura On Ubrelvy  Hyperlipidemia associated with type 2 diabetes mellitus (HCC) Continue statin    DVT prophylaxis: Lovenox  Consults: none  Advance Care Planning:   Code Status: Prior   Family Communication: none  Disposition Plan: Back to previous home environment  Severity of Illness: The appropriate patient status for this patient is INPATIENT. Inpatient status is judged to be reasonable and necessary in order to provide the required intensity of service to ensure the patient's safety. The patient's presenting symptoms, physical exam findings, and initial radiographic and laboratory data in the context of their chronic comorbidities is felt to place them at high risk for further  clinical deterioration. Furthermore, it is not anticipated that the patient will be medically stable for discharge from the hospital within 2 midnights of admission.   * I certify that at the point of admission it is my clinical judgment that the patient will require inpatient hospital care spanning beyond 2 midnights from the point of admission due to high intensity of service, high risk for further deterioration and high frequency of surveillance required.*  Author: Andris Baumann, MD 08/19/2022 8:41 PM  For on call review [email or web address].

## 2022-08-19 NOTE — Telephone Encounter (Signed)
Agree with return to ER if fever recurrent.

## 2022-08-19 NOTE — Assessment & Plan Note (Signed)
On Ubrelvy

## 2022-08-19 NOTE — ED Notes (Signed)
While triage patient, patient went stiff and unresponsive staring off and not answering this RN's questions. First nurse made aware and patient roomed to 67. Patient alert and answering questions after getting into room and on stretcher.

## 2022-08-19 NOTE — Progress Notes (Signed)
Pharmacy Antibiotic Note  Debra Leach is a 72 y.o. female admitted on 08/19/2022 with sepsis.  Pharmacy has been consulted for cefepime, Vancomycin dosing.  Plan: Cefepime 2 gm IV X 1 given in ED on 3/14 @ 1819. Cefepime 2 gm IV Q12H ordered to start on 3/15 @ 0600.  Vancomycin 1500 mg IV X 1 given in ED on 3/14 @ 2002. Vancomycin 750 mg IV Q24H ordered to start on 3/15 @ 2000.  AUC = 481.1  Vanc trough = 13.7   Height: 5' (152.4 cm) Weight: 66.9 kg (147 lb 7.8 oz) IBW/kg (Calculated) : 45.5  Temp (24hrs), Avg:102.5 F (39.2 C), Min:101.3 F (38.5 C), Max:103.2 F (39.6 C)  Recent Labs  Lab 08/16/22 1213 08/16/22 1920 08/16/22 1932 08/16/22 2245 08/17/22 0210 08/18/22 0224 08/19/22 1719  WBC 8.6 12.8*  --   --  11.2* 8.3 20.4*  CREATININE 1.04 1.13* 1.10*  --  1.03* 0.90 1.10*  LATICACIDVEN  --  2.0*  --  1.6  --   --  2.4*    Estimated Creatinine Clearance: 40.1 mL/min (A) (by C-G formula based on SCr of 1.1 mg/dL (H)).    Allergies  Allergen Reactions   Sudafed [Pseudoephedrine Hcl] Hives, Itching and Anxiety   Atenolol Other (See Comments)    Migraine   Codeine Itching and Swelling    REACTION: Migraine   Epinephrine Hives   Hydrocodone Other (See Comments)    Migraine   Ibuprofen Other (See Comments)    Migraine   Jardiance [Empagliflozin] Other (See Comments)    Caused uti Yeast infection   Oxycodone Other (See Comments)    Migraine   Penicillins Itching and Swelling    ** Tolerates cephalosporins Has patient had a PCN reaction causing immediate rash, facial/tongue/throat swelling, SOB or lightheadedness with hypotension: No Has patient had a PCN reaction causing severe rash involving mucus membranes or skin necrosis: No Has patient had a PCN reaction that required hospitalization: No Has patient had a PCN reaction occurring within the last 10 years: No     Pentazocine Lactate     REACTION: Swelling, itching, rash   Promethazine Other  (See Comments)    migraine   Propranolol Other (See Comments)    Migraine   Talwin [Pentazocine] Other (See Comments)    Swelling and itching    Tramadol Other (See Comments)    Migraine   Crestor [Rosuvastatin] Other (See Comments)    Muscle pain    Antimicrobials this admission:   >>    >>   Dose adjustments this admission:   Microbiology results:  BCx:   UCx:    Sputum:    MRSA PCR:   Thank you for allowing pharmacy to be a part of this patient's care.  Moises Terpstra D 08/19/2022 10:09 PM

## 2022-08-19 NOTE — Assessment & Plan Note (Signed)
Renal function at baseline 

## 2022-08-19 NOTE — ED Provider Notes (Signed)
[facility] Provider Note    Event Date/Time   First MD Initiated Contact with Patient 08/19/22 1752     (approximate)   History   Emesis   HPI  Debra Leach is a 72 y.o. female   who presents to the emergency department today because of concerns for nausea, fever and weakness.  The patient was discharged from outside hospital yesterday after an admission for sepsis secondary to urinary tract infection.  When the patient first arrived back home she stated she was feeling better but shortly thereafter started feeling sick again.  She has been taking her antibiotics.  She states that the symptoms she is now feeling are similar to the symptoms she had a prior to her admission to the hospital.    Physical Exam   Triage Vital Signs: ED Triage Vitals  Enc Vitals Group     BP 08/19/22 1716 110/67     Pulse Rate 08/19/22 1716 (!) 122     Resp 08/19/22 1716 18     Temp 08/19/22 1716 (!) 103.2 F (39.6 C)     Temp Source 08/19/22 1716 Oral     SpO2 08/19/22 1716 100 %     Weight 08/19/22 1731 147 lb 7.8 oz (66.9 kg)     Height 08/19/22 1731 5' (1.524 m)     Head Circumference --      Peak Flow --      Pain Score 08/19/22 1717 0     Pain Loc --      Pain Edu? --      Excl. in [place]? --     Most recent vital signs: Vitals:   08/19/22 1716  BP: 110/67  Pulse: (!) 122  Resp: 18  Temp: (!) 103.2 F (39.6 C)  SpO2: 100%   General: Awake, alert, oriented. CV:  Good peripheral perfusion. Tachycardia. Resp:  Normal effort. Lungs clear. Abd:  No distention. Non tender.    ED Results / Procedures / Treatments   Labs (all labs ordered are listed, but only abnormal results are displayed) Labs Reviewed  COMPREHENSIVE METABOLIC PANEL - Abnormal; Notable for the following components:      Result Value   Glucose, Bld 115 (*)    BUN 5 (*)    Creatinine, Ser 1.10 (*)    Calcium 8.5 (*)    Albumin 2.9 (*)    AST 52 (*)    GFR, Estimated 54 (*)     All other components within normal limits  LACTIC ACID, PLASMA - Abnormal; Notable for the following components:   Lactic Acid, Venous 2.4 (*)    All other components within normal limits  CBC WITH DIFFERENTIAL/PLATELET - Abnormal; Notable for the following components:   WBC 20.4 (*)    RDW 18.3 (*)    Neutro Abs 18.8 (*)    Abs Immature Granulocytes 0.12 (*)    All other components within normal limits  PROTIME-INR - Abnormal; Notable for the following components:   Prothrombin Time 15.8 (*)    INR 1.3 (*)    All other components within normal limits  CULTURE, BLOOD (ROUTINE X 2)  CULTURE, BLOOD (ROUTINE X 2)  LACTIC ACID, PLASMA  URINALYSIS, ROUTINE W REFLEX MICROSCOPIC  CORTISOL-AM, BLOOD  PROCALCITONIN  URINALYSIS, W/ REFLEX TO CULTURE (INFECTION SUSPECTED)     EKG  None   RADIOLOGY I independently interpreted and visualized the CXR. My interpretation: No pneumonia, small left sided pleural effusion Radiology interpretation:  IMPRESSION:  Small hiatal hernia.    RIGHT basilar atelectasis.    Aortic Atherosclerosis (ICD10-I70.0).      PROCEDURES:  Critical Care performed: Yes  CRITICAL CARE Performed by: Nance Pear   Total critical care time: 35 minutes  Critical care time was exclusive of separately billable procedures and treating other patients.  Critical care was necessary to treat or prevent imminent or life-threatening deterioration.  Critical care was time spent personally by me on the following activities: development of treatment plan with patient and/or surrogate as well as nursing, discussions with consultants, evaluation of patient's response to treatment, examination of patient, obtaining history from patient or surrogate, ordering and performing treatments and interventions, ordering and review of laboratory studies, ordering and review of radiographic studies, pulse oximetry and re-evaluation of patient's condition.    Procedures    MEDICATIONS ORDERED IN ED: Medications - No data to display   IMPRESSION / MDM / [place] / ED COURSE  I reviewed the triage vital signs and the nursing notes.                              Differential diagnosis includes, but is not limited to, sepsis, UTI, pneumonia.  Patient's presentation is most consistent with acute presentation with potential threat to life or bodily function.   The patient is on the cardiac monitor to evaluate for evidence of arrhythmia and/or significant heart rate changes.  Patient presented to the emergency department today because of concerns for nausea, fevers, weakness.  Patient had recent admission for urosepsis with discharged yesterday.  Patient was found to be febrile here in the emergency department.  Did have concern for significant infection.  Blood work is concerning for elevation of white blood cell count greater than it was at time of discharge.  Additionally lactic acid level was elevated.  Because of this broad spectrum IV antibiotics were initiated.  Additionally patient was given IV fluid bolus.  Discussed concern for worsening infection with patient and family at bedside.  Discussed with Dr. Damita Dunnings with the hospitalist service who will plan on admission.      FINAL CLINICAL IMPRESSION(S) / ED DIAGNOSES   Final diagnoses:  Sepsis, due to unspecified organism, unspecified whether acute organ dysfunction present [facility])     Rx / DC Orders   ED Discharge Orders     None        Note:  This document was prepared using Dragon voice recognition software and may include unintentional dictation errors.    Nance Pear, MD 08/19/22 2229

## 2022-08-19 NOTE — Assessment & Plan Note (Signed)
Continue basal insulin Sliding scale insulin coverage Continue Prandin

## 2022-08-19 NOTE — Consult Note (Signed)
PHARMACY -  BRIEF ANTIBIOTIC NOTE   Pharmacy has received consult(s) for cefepime and vancomycin from an ED provider.  The patient's profile has been reviewed for ht/wt/allergies/indication/available labs.    One time order(s) placed for  Cefepime 2 gram Vancomycin 1500 mg   Further antibiotics/pharmacy consults should be ordered by admitting physician if indicated.                       Thank you, Dorothe Pea, PharmD, BCPS Clinical Pharmacist   08/19/2022  6:27 PM

## 2022-08-19 NOTE — Assessment & Plan Note (Signed)
Continue statin. 

## 2022-08-19 NOTE — Transitions of Care (Post Inpatient/ED Visit) (Signed)
   08/19/2022  Name: Debra Leach MRN: [id number] DOB: [birth year]  Today's TOC FU Call Status: Today's TOC FU Call Status:: Successful TOC FU Call Competed Unsuccessful Call (1st Attempt) Date: 08/19/22 [facility] FU Call Complete Date: 08/19/22  Transition Care Management Follow-up Telephone Call Date of Discharge: 08/18/22 Discharge Facility: [facility]) Type of Discharge: Inpatient Admission Primary Inpatient Discharge Diagnosis:: sepsis s/t to UTI How have you been since you were released from the hospital?: Worse Any questions or concerns?: Yes Patient Questions/Concerns:: (S) pt has had fever 100 and rising after tylenol to 102- n/v as well- pt feels horrible- advised pt to go back to ER - will keep fu appt as scheduled  Items Reviewed: Did you receive and understand the discharge instructions provided?: Yes Medications obtained and verified?: Yes (Medications Reviewed) Any new allergies since your discharge?: No Dietary orders reviewed?: Yes Do you have support at home?: Yes  Home Care and Equipment/Supplies: [place] Ordered?: No Any new equipment or medical supplies ordered?: No  Functional Questionnaire: Do you need assistance with bathing/showering or dressing?: No Do you need assistance with meal preparation?: No Do you need assistance with eating?: No Do you have difficulty maintaining continence: No Do you need assistance with getting out of bed/getting out of a chair/moving?: No Do you have difficulty managing or taking your medications?: No  Folllow up appointments reviewed: PCP Follow-up appointment confirmed?: Yes Date of PCP follow-up appointment?: 08/25/22 Follow-up Provider: [facility] Follow-up appointment confirmed?: No Do you need transportation to your follow-up appointment?: No Do you understand care options if your condition(s) worsen?: Yes-patient verbalized understanding    [place] LPN [place] [phone]

## 2022-08-19 NOTE — Assessment & Plan Note (Signed)
-   Continue Eliquis 

## 2022-08-19 NOTE — Transitions of Care (Post Inpatient/ED Visit) (Signed)
   08/19/2022  Name: Debra Leach MRN: [id number] DOB: Jul 25, 1950  Today's TOC FU Call Status: Today's TOC FU Call Status:: Unsuccessul Call (1st Attempt) Unsuccessful Call (1st Attempt) Date: 08/19/22  Attempted to reach the patient regarding the most recent Inpatient/ED visit.  Follow Up Plan: Additional outreach attempts will be made to reach the patient to complete the Transitions of Care (Post Inpatient/ED visit) call.   [place] LPN [place] Advisor Direct Dial [phone]

## 2022-08-20 DIAGNOSIS — A419 Sepsis, unspecified organism: Secondary | ICD-10-CM | POA: Diagnosis not present

## 2022-08-20 LAB — BASIC METABOLIC PANEL
Anion gap: 12 (ref 5–15)
BUN: 8 mg/dL (ref 8–23)
CO2: 24 mmol/L (ref 22–32)
Calcium: 7.8 mg/dL — ABNORMAL LOW (ref 8.9–10.3)
Chloride: 105 mmol/L (ref 98–111)
Creatinine, Ser: 0.92 mg/dL (ref 0.44–1.00)
GFR, Estimated: >60 mL/min (ref >60)
Glucose, Bld: 103 mg/dL — ABNORMAL HIGH (ref 70–99)
Potassium: 3.1 mmol/L — ABNORMAL LOW (ref 3.5–5.1)
Sodium: 141 mmol/L (ref 135–145)

## 2022-08-20 LAB — GASTROINTESTINAL PANEL BY PCR, STOOL (REPLACES STOOL CULTURE)

## 2022-08-20 LAB — GLUCOSE, CAPILLARY
Glucose-Capillary: 111 mg/dL — ABNORMAL HIGH (ref 70–99)
Glucose-Capillary: 125 mg/dL — ABNORMAL HIGH (ref 70–99)
Glucose-Capillary: 170 mg/dL — ABNORMAL HIGH (ref 70–99)
Glucose-Capillary: 179 mg/dL — ABNORMAL HIGH (ref 70–99)
Glucose-Capillary: 58 mg/dL — ABNORMAL LOW (ref 70–99)
Glucose-Capillary: 72 mg/dL (ref 70–99)
Glucose-Capillary: 73 mg/dL (ref 70–99)
Glucose-Capillary: 90 mg/dL (ref 70–99)

## 2022-08-20 LAB — URINALYSIS, ROUTINE W REFLEX MICROSCOPIC
Bilirubin Urine: NEGATIVE
Glucose, UA: NEGATIVE mg/dL
Hgb urine dipstick: NEGATIVE
Ketones, ur: NEGATIVE mg/dL
Leukocytes,Ua: NEGATIVE
Nitrite: NEGATIVE
Protein, ur: NEGATIVE mg/dL
Specific Gravity, Urine: 1.004 — ABNORMAL LOW (ref 1.005–1.030)
pH: 6 (ref 5.0–8.0)

## 2022-08-20 LAB — C DIFFICILE QUICK SCREEN W PCR REFLEX
C Diff antigen: NEGATIVE
C Diff interpretation: NOT DETECTED
C Diff toxin: NEGATIVE

## 2022-08-20 LAB — PHOSPHORUS: Phosphorus: 3.5 mg/dL (ref 2.5–4.6)

## 2022-08-20 LAB — CBC
HCT: 32.6 % — ABNORMAL LOW (ref 36.0–46.0)
Hemoglobin: 10.1 g/dL — ABNORMAL LOW (ref 12.0–15.0)
MCH: 27.2 pg (ref 26.0–34.0)
MCHC: 31 g/dL (ref 30.0–36.0)
MCV: 87.6 fL (ref 80.0–100.0)
Platelets: 311 10*3/uL (ref 150–400)
RBC: 3.72 MIL/uL — ABNORMAL LOW (ref 3.87–5.11)
RDW: 18.6 % — ABNORMAL HIGH (ref 11.5–15.5)
WBC: 13 10*3/uL — ABNORMAL HIGH (ref 4.0–10.5)
nRBC: 0 % (ref 0.0–0.2)

## 2022-08-20 LAB — PROCALCITONIN: Procalcitonin: 2.76 ng/mL

## 2022-08-20 LAB — CORTISOL-AM, BLOOD: Cortisol - AM: 1.5 ug/dL — ABNORMAL LOW (ref 6.7–22.6)

## 2022-08-20 LAB — LIPASE, BLOOD: Lipase: 20 U/L (ref 11–51)

## 2022-08-20 LAB — MAGNESIUM: Magnesium: 1.6 mg/dL — ABNORMAL LOW (ref 1.7–2.4)

## 2022-08-20 MED ORDER — BUDESONIDE 3 MG PO CPEP
6.0000 mg | ORAL_CAPSULE | Freq: Every day | ORAL | Status: DC
  Administered 2022-08-20 – 2022-08-24 (×5): 6 mg via ORAL
  Filled 2022-08-20 (×5): qty 2

## 2022-08-20 MED ORDER — SODIUM CHLORIDE 0.9 % IV SOLN: INTRAVENOUS | Status: DC

## 2022-08-20 MED ORDER — POTASSIUM CHLORIDE 20 MEQ PO PACK
40.0000 meq | PACK | Freq: Once | ORAL | Status: DC
  Filled 2022-08-20: qty 2

## 2022-08-20 MED ORDER — PANTOPRAZOLE SODIUM 40 MG PO TBEC
40.0000 mg | DELAYED_RELEASE_TABLET | Freq: Every day | ORAL | Status: DC
  Administered 2022-08-20 – 2022-08-21 (×2): 40 mg via ORAL
  Filled 2022-08-20 (×2): qty 1

## 2022-08-20 MED ORDER — VANCOMYCIN HCL 1250 MG/250ML IV SOLN
1250.0000 mg | INTRAVENOUS | Status: DC
  Administered 2022-08-21: 1250 mg via INTRAVENOUS
  Filled 2022-08-20: qty 250

## 2022-08-20 MED ORDER — POTASSIUM CHLORIDE 10 MEQ/100ML IV SOLN
10.0000 meq | INTRAVENOUS | Status: AC
  Administered 2022-08-20 (×4): 10 meq via INTRAVENOUS
  Filled 2022-08-20 (×4): qty 100

## 2022-08-20 MED ORDER — ORAL CARE MOUTH RINSE: 15.0000 mL | OROMUCOSAL | Status: DC | PRN

## 2022-08-20 MED ORDER — MAGNESIUM SULFATE 2 GM/50ML IV SOLN
2.0000 g | Freq: Once | INTRAVENOUS | Status: AC
  Administered 2022-08-20: 2 g via INTRAVENOUS
  Filled 2022-08-20: qty 50

## 2022-08-20 MED ORDER — BENZONATATE 100 MG PO CAPS: 100.0000 mg | ORAL_CAPSULE | Freq: Three times a day (TID) | ORAL | Status: DC | PRN

## 2022-08-20 MED ORDER — POTASSIUM CHLORIDE CRYS ER 20 MEQ PO TBCR: 40.0000 meq | EXTENDED_RELEASE_TABLET | Freq: Once | ORAL | Status: DC

## 2022-08-20 MED ORDER — SACCHAROMYCES BOULARDII 250 MG PO CAPS
250.0000 mg | ORAL_CAPSULE | Freq: Two times a day (BID) | ORAL | Status: DC
  Administered 2022-08-20 – 2022-08-24 (×9): 250 mg via ORAL
  Filled 2022-08-20 (×9): qty 1

## 2022-08-20 NOTE — Progress Notes (Signed)
Pharmacy Antibiotic Note Debra Leach is a 72 y.o. female admitted on 08/19/2022 with sepsis.  Pharmacy has been consulted for cefepime, Vancomycin dosing. Noted patient recent discharge on 08/18/2022 and on immunosuppressant meds. Metronidazole IVPB q 12hr also added to therapy by MD.  Scr improved from 1.10 to 0.92 after hydration  Plan: Continue Cefepime 2 gm IV Q12H ordered to start on 3/15 @ 0600.  2. Change vancomycin to 1250 mg IVPV q 36hrs start on 3/16 @ 0800. Goal AUC 400-550 Expected AUC: 457 SCr used: 0.92  3. Follow up Scr and cultures to adjust therapy as needed  Height: 5' (152.4 cm) Weight: 66.9 kg (147 lb 7.8 oz) IBW/kg (Calculated) : 45.5  Temp (24hrs), Avg:100.8 F (38.2 C), Min:98 F (36.7 C), Max:103.2 F (39.6 C)  Recent Labs  Lab 08/16/22 1920 08/16/22 1932 08/16/22 2245 08/17/22 0210 08/18/22 0224 08/19/22 1719 08/19/22 2308 08/20/22 0917  WBC 12.8*  --   --  11.2* 8.3 20.4*  --  13.0*  CREATININE 1.13* 1.10*  --  1.03* 0.90 1.10*  --  0.92  LATICACIDVEN 2.0*  --  1.6  --   --  2.4* 1.1  --      Estimated Creatinine Clearance: 47.9 mL/min (by C-G formula based on SCr of 0.92 mg/dL).    Allergies  Allergen Reactions   Sudafed [Pseudoephedrine Hcl] Hives, Itching and Anxiety   Atenolol Other (See Comments)    Migraine   Codeine Itching and Swelling    REACTION: Migraine   Epinephrine Hives   Hydrocodone Other (See Comments)    Migraine   Ibuprofen Other (See Comments)    Migraine   Jardiance [Empagliflozin] Other (See Comments)    Caused uti Yeast infection   Oxycodone Other (See Comments)    Migraine   Penicillins Itching and Swelling    ** Tolerates cephalosporins Has patient had a PCN reaction causing immediate rash, facial/tongue/throat swelling, SOB or lightheadedness with hypotension: No Has patient had a PCN reaction causing severe rash involving mucus membranes or skin necrosis: No Has patient had a PCN reaction that  required hospitalization: No Has patient had a PCN reaction occurring within the last 10 years: No     Pentazocine Lactate     REACTION: Swelling, itching, rash   Promethazine Other (See Comments)    migraine   Propranolol Other (See Comments)    Migraine   Talwin [Pentazocine] Other (See Comments)    Swelling and itching    Tramadol Other (See Comments)    Migraine   Crestor [Rosuvastatin] Other (See Comments)    Muscle pain    Antibiotic history since 3/11: Vancomycin & cefepime 3/11 only Ceftriaxone 3/12 > 3/13 Cefdinir 3/13 on discharge > 3/14  Re-admission 3/14 in evening: Vanc + cefepime + metronidazole 3/14>>  Dose adjustments this admission: none   Microbiology results: 3/12: MRSA PCR: not detected 3/11: Blood cx : NG x 4 3/14: Blood cx : Ng < 12hrs   Thank you for allowing pharmacy to be a part of this patient's care. Debra Leach PharmD, BCPS 08/20/2022 11:46 AM

## 2022-08-20 NOTE — Progress Notes (Signed)
[facility] Progress Note  Patient: Debra Leach    [id number]  DOA: 08/19/2022     Date of Service: the patient was seen and examined on 08/20/2022  Chief Complaint  Patient presents with   Emesis   Brief hospital course: Debra Leach is a 72 y.o. 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  Urine culture from 3/13 showed multiple species with recommendation for recollection.  Blood cultures from 3/11 were negative, respiratory viral panel from 3/11 was also negative. ED course and data review: Tmax 103.2 with pulse 122, respirations 23.  Labs notable for WBC 20,400, up from 8.3 on discharge with lactic acid 2.4, up from 1.6, 3 days ago.  Creatinine 1.1 up from baseline of 0.9 when discharged a day prior. Chest x-ray nonacute Patient started on sepsis protocol with IV fluid bolus, cefepime metronidazole and Flagyl for sepsis of unknown source.  Hospitalist consulted for admission.   Assessment and Plan:  Sepsis UTI and left lower extremity cellulitis Sepsis criteria includes fever of 103.2, tachycardia of 122 and tachypnea of 23 with WBC 20,000 up from 8300 on discharge the day prior with lactic acid 2.4 Patient presented with nausea, vomiting and diarrhea C. difficile and GI pathogen negative CT A/P negative for any acute findings.  CXR negative Continue antibiotics cefepime, Flagyl and vancomycin, pharmacy consulted for dosing and trough monitoring Hold immunosuppressant medications Started probiotics UA not very impressive, follow urine culture and blood  cultures   Hypokalemia, potassium repleted Hypomagnesemia, mag repleted Monitor electrolytes and replete as needed.  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  Interventions:       Diet: Diabetic diet DVT Prophylaxis: Therapeutic Anticoagulation with Eliquis    Advance goals of care discussion: Full code  Family Communication: family was not present at bedside, at the time of interview.  The pt provided permission to discuss medical plan with the family. Opportunity was given to ask question and all questions were answered satisfactorily.   Disposition:  Pt is from Home, admitted with sepsis, still on IV antibiotics, which precludes a safe discharge. Discharge to home, when clinically stable, may need few days to improve.  Subjective: No significant events overnight, patient is still having diarrhea, 3 episodes today morning, denies any dysuria, no abdominal pain, no nausea vomiting today.  Denies any chest pain or palpitation, no shortness of breath.  Physical Exam: General: NAD, lying comfortably Appear in no distress, affect appropriate Eyes: PERRLA ENT: Oral Mucosa Clear, moist  Neck: no JVD,  Cardiovascular: S1 and S2 Present, no Murmur,   Respiratory: good respiratory effort, Bilateral Air entry equal and Decreased, no Crackles, no wheezes Abdomen: Bowel Sound present, Soft and no tenderness,  Skin: no rashes Extremities: Pedal edema, no calf tenderness, LLE mild erythema and tenderness, most likely cellulitis. Right hand Cast, patient is following with hand surgery as an outpatient Neurologic: without any new focal findings Gait not checked due to patient safety concerns  Vitals:   08/19/22 2116 08/19/22 2211 08/19/22 2253 08/20/22 0726  BP:  (!) 92/53 (!) 94/54 (!) 120/48  Pulse:  94 91 93  Resp:  18 20 16   Temp: (!) 101.3 F (38.5 C)  98 F (36.7 C) 98.7 F (37.1 C)  TempSrc: Oral     SpO2:  93% 91% 96%  Weight:      Height:        Intake/Output Summary (Last 24 hours) at 08/20/2022 1456 Last data filed at 08/20/2022 1031 Gross per 24 hour  Intake 811.44 ml  Output 500 ml  Net 311.44 ml   Filed Weights   08/19/22 1731  Weight: 66.9 kg    Data Reviewed: I have personally reviewed and interpreted daily labs, tele strips, imagings as discussed above. I reviewed all nursing notes, pharmacy notes, vitals, pertinent old records I have discussed plan of care as described above with RN and patient/family.  CBC: Recent Labs  Lab 08/16/22 1213 08/16/22 1920 08/16/22 1932 08/17/22 0210 08/18/22 0224 08/19/22 1719 08/20/22 0917  WBC 8.6 12.8*  --  11.2* 8.3 20.4* 13.0*  NEUTROABS 6.8 11.6*  --   --   --  18.8*  --   HGB 13.3 11.6* 12.2 10.7* 10.9* 12.6 10.1*  HCT 40.7 36.3 36.0 33.1* 33.7* 41.1 32.6*  MCV 86.2 87.5  --  86.6 86.6 87.8 87.6  PLT 289.0 224  --  213 234 372 [id number]   Basic Metabolic Panel: Recent Labs  Lab 08/16/22 1920 08/16/22 1932 08/17/22 0210 08/18/22 0224 08/19/22 1719 08/20/22 0917  NA 132* 133* 136 140 141 141  K 4.0 4.1 3.4* 4.0 3.6 3.1*  CL 96* 98 104 110 102 105  CO2 24  --  22 22 27 24   GLUCOSE 293* 284* 217* 116* 115* 103*  BUN 7* 7* 7* 6* 5* 8  CREATININE 1.13*  1.10* 1.03* 0.90 1.10* 0.92  CALCIUM 7.9*  --  7.7* 7.9* 8.5* 7.8*  MG  --   --  1.6* 2.2  --  1.6*  PHOS  --   --  3.7  --   --  3.5    Studies: CT ABDOMEN PELVIS WO CONTRAST  Result Date: 08/19/2022 CLINICAL DATA:  Comes to the ED for nausea and vomiting. Released from hospital yesterday for UTI. EXAM: CT ABDOMEN AND PELVIS WITHOUT CONTRAST TECHNIQUE: Multidetector CT imaging of the abdomen and pelvis was performed following the standard protocol without IV contrast. RADIATION DOSE REDUCTION: This exam was performed according to the departmental dose-optimization program which includes automated exposure control, adjustment of the mA and/or kV according to patient size and/or use of iterative reconstruction technique. COMPARISON:  MRI abdomen 11/20/2021 and CT 07/12/2017 FINDINGS: Lower chest: Bibasilar scarring/atelectasis. Lower lobe bronchiolectasis. No acute abnormality. Hepatobiliary: Hepatic steatosis. Sludge within the gallbladder. No biliary dilation. No evidence of cholecystitis. Pancreas: Atrophic pancreas. No evidence of pancreatitis or pancreatic ductal dilation. Spleen: Unremarkable. Adrenals/Urinary Tract: Normal adrenal glands. Bilateral cortical renal scarring and nonobstructing tiny coaxial stones. No hydronephrosis. Unremarkable  bladder. Stomach/Bowel: Normal caliber large and small bowel. No bowel wall thickening. Small hiatal hernia. Duodenal diverticulum. Vascular/Lymphatic: Aortic atherosclerosis. No enlarged abdominal or pelvic lymph nodes. Reproductive: Hysterectomy. Other: No free intraperitoneal fluid or air. Musculoskeletal: No acute osseous abnormality. 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 IMPRESSION: 1. No acute findings in the abdomen or pelvis. 2. Tiny nonobstructing bilateral nephrolithiasis. 3. Cholelithiasis without evidence of cholecystitis. 4. Hepatic steatosis. 5. 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  Electronically Signed   By: Placido Sou M.D.   On: 08/19/2022 22:46   DG Chest Port 1 View  Result Date: 08/19/2022 CLINICAL DATA:  Sepsis, UTI, nausea, and vomiting EXAM: PORTABLE CHEST 1 VIEW COMPARISON:  Portable exam 1744 hours compared to 08/16/2022 FINDINGS: Chronic elevation of RIGHT diaphragm. Normal heart size and pulmonary vascularity. Small hiatal hernia. Mild RIGHT basilar atelectasis. Lungs otherwise clear. No infiltrate, pleural effusion, or pneumothorax. Atherosclerotic calcifications at aortic arch. No acute osseous findings. IMPRESSION: Small hiatal hernia. RIGHT basilar atelectasis. Aortic Atherosclerosis (ICD10-I70.0). Electronically Signed   By: Lavonia Dana M.D.   On: 08/19/2022 18:03    Scheduled Meds:  apixaban  2.5 mg Oral BID   atorvastatin  80 mg Oral QHS   budesonide  6 mg Oral Daily   gabapentin  600 mg Oral QHS   insulin aspart  0-15 Units Subcutaneous TID WC   insulin aspart  0-5 Units Subcutaneous QHS   insulin glargine-yfgn  15 Units Subcutaneous Daily   pantoprazole  40 mg Oral Daily   repaglinide  1 mg Oral QAC lunch   saccharomyces boulardii  250 mg Oral BID   Continuous Infusions:  ceFEPime (MAXIPIME) IV 2 g (08/20/22 [id number])   lactated ringers 150 mL/hr (08/20/22 1430)   magnesium sulfate bolus IVPB 2 g (08/20/22 1437)   metronidazole 500 mg (08/20/22 0711)   potassium chloride 10 mEq (08/20/22 1436)   [START ON 08/21/2022] vancomycin     PRN Meds: acetaminophen **OR** acetaminophen, benzonatate, gabapentin, morphine injection, ondansetron **OR** ondansetron (ZOFRAN) IV, mouth rinse  Time spent: 50 minutes  Author: Val Riles. MD [facility] 08/20/2022 2:56 PM  To reach On-call, see care teams to locate the attending and reach out to them via [email or web address]. If 7PM-7AM, please contact night-coverage If you still have difficulty reaching the attending provider, please page the [facility] (Director on Call) for [facility] on amion for  assistance.

## 2022-08-20 NOTE — Progress Notes (Addendum)
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   Pt admitted with sepsis.   Reviewed I/O's: +311 ml x 24 hours  UOP: 500 ml x 24 hours  Spoke with pt at bedside, who reports feeling better today. She reports decreased oral intake and feeling poorly over the past week. Her last "full meal" was on 08/12/22. Pt shares that she has not been feeling well and been grazing on fruit and toast. Today, she reports that she is starting to get her appetite back, however, has multiple food sensitive that worsen her migraines. Pt consumed some eggs and fruit, but unable to eat most of the fruit due to food sensitivities. Pt amenable to regular diet to widen food selections (pt also has intolerance to artificial sweeteners and food additives).   Pt suspects she has lost weight due to illness, but unsure how much. Reviewed wt hx; pt has experienced a 2.6% wt loss over the past 6 months, which is not significant for time frame.   Discussed importance of good meal and supplement intake to promote healing. Pt declines off of oral nutrition supplements secondary to food insensitivities.   Nutrition-Focused physical exam completed. Findings are no fat  depletion, no muscle depletion, and no edema.    Lab Results  Component Value Date   HGBA1C 8.4 (H) 08/16/2022   PTA DM medications are 18 units insulin glargine daily.   Labs reviewed: K: 3.1, Mg: 1.6, CBGS: 72 (inpatient orders for glycemic control are ).    Current diet order is carb modified, patient is consuming approximately n/a% of meals at this time. Labs and medications reviewed. Liberalized diet to regular.   No nutrition interventions warranted at this time. If nutrition issues arise, please consult RD.   Loistine Chance, RD, LDN, [place] Registered Dietitian II Certified Diabetes Care and Education Specialist Please refer to [facility] for RD and/or RD on-call/weekend/after hours pager

## 2022-08-20 NOTE — Progress Notes (Signed)
Hypoglycemic Event  CBG Time:  2324  CBG Result: 58  Treatment: 8 oz juice/soda  Symptoms: None  Follow-up CBG Time:0041 CBG Result:73  Possible Reasons for Event: Inadequate meal intake  Comments:  Follow-up BG delayed due to pt's slow intake of juice.    Debra Leach D Debra Leach

## 2022-08-20 NOTE — TOC Progression Note (Signed)
Transition of Care [facility]) - Progression Note    Patient Details  Name: Debra Leach MRN: [id number] Date of Birth: [birth year]  Transition of Care [facility]) CM/SW [place], RN Phone Number: 08/20/2022, 2:27 PM  Clinical Narrative:   patient from home with husband has Shower seat - built in;Cane - single point at home, walks without Assisted device at home Her husband helps with ADLS  Transition of Care (TOC) Screening Note   Patient Details  Name: Debra Leach Date of Birth: May 02, 1951   Transition of Care [facility]) CM/SW Contact:    Conception Oms, RN Phone Number: 08/20/2022, 2:28 PM    Transition of Care Department [facility]) has reviewed patient and no TOC needs have been identified at this time. We will continue to monitor patient advancement through interdisciplinary progression rounds. If new patient transition needs arise, please place a TOC consult.     Expected Discharge Plan: Home/Self Care    Expected Discharge Plan and Services                                               Social Determinants of Health (SDOH) Interventions SDOH Screenings   Food Insecurity: No Food Insecurity (08/19/2022)  Housing: Low Risk  (08/19/2022)  Transportation Needs: No Transportation Needs (08/19/2022)  Utilities: Not At Risk (08/19/2022)  Depression (PHQ2-9): Low Risk  (05/11/2022)  Financial Resource Strain: Low Risk  (11/20/2019)  Physical Activity: Inactive (04/06/2019)  Stress: No Stress Concern Present (04/06/2019)  Tobacco Use: Medium Risk (08/19/2022)    Readmission Risk Interventions     No data to display

## 2022-08-21 DIAGNOSIS — A419 Sepsis, unspecified organism: Secondary | ICD-10-CM | POA: Diagnosis not present

## 2022-08-21 LAB — PHOSPHORUS: Phosphorus: 3.1 mg/dL (ref 2.5–4.6)

## 2022-08-21 LAB — BASIC METABOLIC PANEL
Anion gap: 7 (ref 5–15)
BUN: 8 mg/dL (ref 8–23)
CO2: 23 mmol/L (ref 22–32)
Calcium: 7.7 mg/dL — ABNORMAL LOW (ref 8.9–10.3)
Chloride: 115 mmol/L — ABNORMAL HIGH (ref 98–111)
Creatinine, Ser: 0.85 mg/dL (ref 0.44–1.00)
GFR, Estimated: >60 mL/min (ref >60)
Glucose, Bld: 81 mg/dL (ref 70–99)
Potassium: 3.6 mmol/L (ref 3.5–5.1)
Sodium: 145 mmol/L (ref 135–145)

## 2022-08-21 LAB — URINE CULTURE: Culture: NO GROWTH

## 2022-08-21 LAB — TSH: TSH: 0.456 u[IU]/mL (ref 0.350–4.500)

## 2022-08-21 LAB — CBC
HCT: 31.3 % — ABNORMAL LOW (ref 36.0–46.0)
Hemoglobin: 9.8 g/dL — ABNORMAL LOW (ref 12.0–15.0)
MCH: 27.1 pg (ref 26.0–34.0)
MCHC: 31.3 g/dL (ref 30.0–36.0)
MCV: 86.7 fL (ref 80.0–100.0)
Platelets: 366 10*3/uL (ref 150–400)
RBC: 3.61 MIL/uL — ABNORMAL LOW (ref 3.87–5.11)
RDW: 18.6 % — ABNORMAL HIGH (ref 11.5–15.5)
WBC: 10.6 10*3/uL — ABNORMAL HIGH (ref 4.0–10.5)
nRBC: 0 % (ref 0.0–0.2)

## 2022-08-21 LAB — GLUCOSE, CAPILLARY
Glucose-Capillary: 125 mg/dL — ABNORMAL HIGH (ref 70–99)
Glucose-Capillary: 71 mg/dL (ref 70–99)
Glucose-Capillary: 95 mg/dL (ref 70–99)
Glucose-Capillary: 107 mg/dL — ABNORMAL HIGH (ref 70–99)

## 2022-08-21 LAB — CULTURE, BLOOD (ROUTINE X 2): Culture: NO GROWTH

## 2022-08-21 LAB — MAGNESIUM: Magnesium: 2.5 mg/dL — ABNORMAL HIGH (ref 1.7–2.4)

## 2022-08-21 LAB — CORTISOL-PM, BLOOD: Cortisol - PM: 0.7 ug/dL (ref <10.0)

## 2022-08-21 LAB — LIPASE, BLOOD: Lipase: 20 U/L (ref 11–51)

## 2022-08-21 MED ORDER — FAMOTIDINE IN NACL 20-0.9 MG/50ML-% IV SOLN
20.0000 mg | Freq: Once | INTRAVENOUS | Status: AC
  Administered 2022-08-21: 20 mg via INTRAVENOUS
  Filled 2022-08-21: qty 50

## 2022-08-21 MED ORDER — PANTOPRAZOLE SODIUM 40 MG PO TBEC
40.0000 mg | DELAYED_RELEASE_TABLET | Freq: Every day | ORAL | Status: DC
  Administered 2022-08-24: 40 mg via ORAL
  Filled 2022-08-21: qty 1

## 2022-08-21 MED ORDER — INSULIN GLARGINE-YFGN 100 UNIT/ML ~~LOC~~ SOLN
12.0000 [IU] | Freq: Every day | SUBCUTANEOUS | Status: DC
  Administered 2022-08-22 – 2022-08-24 (×3): 12 [IU] via SUBCUTANEOUS
  Filled 2022-08-21 (×3): qty 0.12

## 2022-08-21 MED ORDER — DIPHENHYDRAMINE HCL 50 MG/ML IJ SOLN
25.0000 mg | Freq: Three times a day (TID) | INTRAMUSCULAR | Status: DC | PRN
  Administered 2022-08-21: 25 mg via INTRAVENOUS
  Filled 2022-08-21: qty 1

## 2022-08-21 MED ORDER — LOPERAMIDE HCL 2 MG PO CAPS: 2.0000 mg | ORAL_CAPSULE | Freq: Three times a day (TID) | ORAL | Status: DC | PRN

## 2022-08-21 MED ORDER — FAMOTIDINE 20 MG PO TABS
40.0000 mg | ORAL_TABLET | Freq: Two times a day (BID) | ORAL | Status: AC
  Administered 2022-08-21 – 2022-08-23 (×4): 40 mg via ORAL
  Filled 2022-08-21 (×4): qty 2

## 2022-08-21 MED ORDER — METRONIDAZOLE 500 MG/100ML IV SOLN
500.0000 mg | Freq: Two times a day (BID) | INTRAVENOUS | Status: DC
  Administered 2022-08-21: 500 mg via INTRAVENOUS
  Filled 2022-08-21: qty 100

## 2022-08-21 MED ORDER — DIPHENHYDRAMINE HCL 50 MG/ML IJ SOLN
25.0000 mg | Freq: Once | INTRAMUSCULAR | Status: AC
  Administered 2022-08-21: 25 mg via INTRAVENOUS
  Filled 2022-08-21: qty 1

## 2022-08-21 NOTE — Plan of Care (Signed)

## 2022-08-21 NOTE — Progress Notes (Signed)
Pharmacy Antibiotic Note Debra Leach is a 72 y.o. female admitted on 08/19/2022 with sepsis.  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 She was discharged on 08/18/2022 after brief hospitalization for sepsis where she received vancomycin, cefepime, and ceftriaxone. She was discharged on cefdinir. She returned to ED the following day by EMS for nausea, vomiting, and fever and re-admitted for sepsis. Pharmacy has been consulted for cefepime dosing.  Plan: Day 3 of antibiotics this admission Vancomycin and metronidazole discontinued by provider Continue cefepime 2 g IV Q12H Continue to monitor renal function and follow culture results   Height: 5' (152.4 cm) Weight: 66.9 kg (147 lb 7.8 oz) IBW/kg (Calculated) : 45.5  Temp (24hrs), Avg:98.4 F (36.9 C), Min:98.2 F (36.8 C), Max:98.6 F (37 C)  Recent Labs  Lab 08/16/22 1920 08/16/22 1932 08/16/22 2245 08/17/22 0210 08/18/22 0224 08/19/22 1719 08/19/22 2308 08/20/22 0917 08/21/22 0346  WBC 12.8*  --   --  11.2* 8.3 20.4*  --  13.0* 10.6*  CREATININE 1.13*   < >  --  1.03* 0.90 1.10*  --  0.92 0.85  LATICACIDVEN 2.0*  --  1.6  --   --  2.4* 1.1  --   --    < > = values in this interval not displayed.     Estimated Creatinine Clearance: 51.8 mL/min (by C-G formula based on SCr of 0.85 mg/dL).    Allergies  Allergen Reactions   Sudafed [Pseudoephedrine Hcl] Hives, Itching and Anxiety   Atenolol Other (See Comments)    Migraine   Codeine Itching and Swelling    REACTION: Migraine   Epinephrine Hives   Hydrocodone Other (See Comments)    Migraine   Ibuprofen Other (See Comments)    Migraine   Jardiance [Empagliflozin] Other (See Comments)    Caused uti Yeast infection   Oxycodone Other (See Comments)    Migraine   Penicillins Itching and Swelling    ** Tolerates cephalosporins Has patient had a PCN reaction causing immediate rash,  facial/tongue/throat swelling, SOB or lightheadedness with hypotension: No Has patient had a PCN reaction causing severe rash involving mucus membranes or skin necrosis: No Has patient had a PCN reaction that required hospitalization: No Has patient had a PCN reaction occurring within the last 10 years: No     Pentazocine Lactate     REACTION: Swelling, itching, rash   Promethazine Other (See Comments)    migraine   Propranolol Other (See Comments)    Migraine   Talwin [Pentazocine] Other (See Comments)    Swelling and itching    Tramadol Other (See Comments)    Migraine   Crestor [Rosuvastatin] Other (See Comments)    Muscle pain    Antimicrobials this admission: 3/14 Vancomycin >> 3/16 3/14 Metronidazole >> 3/16 3/14 Cefepime >>  Dose adjustments this admission:  N/A  Microbiology results: 3/14 BCx: NG2D 3/15 UCx: IP  3/12 MRSA PCR: negative  Thank you for allowing pharmacy to be a part of this patient's care.  Gretel Acre, PharmD PGY1 Pharmacy Resident 08/21/2022 10:34 AM

## 2022-08-21 NOTE — Progress Notes (Signed)
[facility] Progress Note  Patient: Debra Leach    [id number]  DOA: 08/19/2022     Date of Service: the patient was seen and examined on 08/21/2022  Chief Complaint  Patient presents with   Emesis   Brief hospital course: Cherylene Grifka is a 72 y.o. 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  Urine culture from 3/13 showed multiple species with recommendation for recollection.  Blood cultures from 3/11 were negative, respiratory viral panel from 3/11 was also negative. ED course and data review: Tmax 103.2 with pulse 122, respirations 23.  Labs notable for WBC 20,400, up from 8.3 on discharge with lactic acid 2.4, up from 1.6, 3 days ago.  Creatinine 1.1 up from baseline of 0.9 when discharged a day prior. Chest x-ray nonacute Patient started on sepsis protocol with IV fluid bolus, cefepime metronidazole and Flagyl for sepsis of unknown source.  Hospitalist consulted for admission.   Assessment and Plan:  Sepsis UTI and left lower extremity cellulitis Sepsis criteria includes fever of 103.2, tachycardia of 122 and tachypnea of 23 with WBC 20,000 up from 8300 on discharge the day prior with lactic acid 2.4 Patient presented with nausea, vomiting and diarrhea. C. difficile and GI pathogen negative.  Started Imodium as needed, continue probiotics CT A/P negative for any acute findings.  CXR negative S/p vancomycin and Flagyl, patient developed allergic reaction, developed lip swelling on 3/16 so discontinued vancomycin and Flagyl.   Continue antibiotics cefepime, pharmacy  consulted for dosing Hold immunosuppressant medications UA not very impressive, follow urine culture and blood cultures Due to lip swelling, suspected allergic reaction to vancomycin, Benadryl 25 mg IV one-time dose and Pepcid 20 mg IV one-time dose given. 3/15 cortisol a.m. level 1.5 very low, TSH 0.45 at lower end Follow  cortisol level p.m. today and repeat cortisol a.m. tomorrow    Hypokalemia, potassium repleted Hypomagnesemia, mag repleted Monitor electrolytes and replete as needed.  Chronic kidney disease, stage 3a ([place]) Renal function at baseline   Type 2 diabetes mellitus with stage 3a chronic kidney disease, with long-term current use of insulin (HCC) Continue basal insulin Sliding scale insulin coverage Continue Prandin   History of pulmonary embolism Continue Eliquis   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HCC) Long-term current use of immunosuppressive medication Patient is on long-term budesonide and azathioprine Will hold azathioprine in the setting of acute infection Resumed budesonide on patient's request, she stated that pancreatitis is going to flareup very quickly if she stops taking budesonide   Migraine with aura On Ubrelvy   Hyperlipidemia associated with type 2 diabetes mellitus ([place]) Continue statin   Body mass index is 28.8 kg/m.  Interventions:       Diet: Diabetic diet DVT Prophylaxis: Therapeutic Anticoagulation with Eliquis    Advance goals of care discussion: Full code  Family Communication: family was not present at bedside, at the time of interview.  The pt provided permission to discuss medical plan with the family. Opportunity was given to ask question and all questions were answered satisfactorily.   Disposition:  Pt is from Home, admitted with sepsis, still on IV antibiotics, which precludes a safe discharge. Discharge to  home, when clinically stable, may need few days to improve.  Subjective: No significant events overnight, patient still  having diarrhea, had 10 episodes yesterday and 5-6 episodes since midnight.  Denies any pain, no nausea vomiting.  Patient received vancomycin and started feeling swelling of upper lip, possible allergic reaction.  No any difficulty breathing or choking sensation.  Denies any chest pain or palpitations. Vancomycin was discontinued, Treated as above. we will continue to monitor.  Physical Exam: General: NAD, lying comfortably Appear in no distress, affect appropriate Eyes: PERRLA ENT: Oral Mucosa Clear, moist  Neck: no JVD,  Cardiovascular: S1 and S2 Present, no Murmur,  Respiratory: good respiratory effort, Bilateral Air entry equal and Decreased, no Crackles, no wheezes Abdomen: Bowel Sound present, Soft and no tenderness,  Skin: no rashes Extremities: Pedal edema, no calf tenderness, LLE mild erythema and tenderness, most likely cellulitis. Right hand Cast, patient is following with hand surgery as an outpatient Neurologic: without any new focal findings Gait not checked due to patient safety concerns  Vitals:   08/20/22 1715 08/20/22 2322 08/21/22 0729 08/21/22 1010  BP: 105/64 138/79 130/70 (!) 161/86  Pulse: 84 96 98 97  Resp: 16 18 17 20   Temp: 98.3 F (36.8 C) 98.2 F (36.8 C) 98.6 F (37 C) 98.4 F (36.9 C)  TempSrc:    Oral  SpO2: 91% 94% 93% 95%  Weight:      Height:        Intake/Output Summary (Last 24 hours) at 08/21/2022 1415 Last data filed at 08/21/2022 0433 Gross per 24 hour  Intake 1140.95 ml  Output --  Net 1140.95 ml   Filed Weights   08/19/22 1731  Weight: 66.9 kg    Data Reviewed: I have personally reviewed and interpreted daily labs, tele strips, imagings as discussed above. I reviewed all nursing notes, pharmacy notes, vitals, pertinent old records I have discussed plan of care as described above with RN and patient/family.  CBC: Recent Labs  Lab 08/16/22 1213 08/16/22 1920 08/16/22 1932 08/17/22 0210 08/18/22 0224 08/19/22 1719  08/20/22 0917 08/21/22 0346  WBC 8.6 12.8*  --  11.2* 8.3 20.4* 13.0* 10.6*  NEUTROABS 6.8 11.6*  --   --   --  18.8*  --   --   HGB 13.3 11.6*   < > 10.7* 10.9* 12.6 10.1* 9.8*  HCT 40.7 36.3   < > 33.1* 33.7* 41.1 32.6* 31.3*  MCV 86.2 87.5  --  86.6 86.6 87.8 87.6 86.7  PLT 289.0 224  --  213 234 372 311 366   < > = values in this interval not displayed.   Basic Metabolic Panel: Recent Labs  Lab 08/17/22 0210 08/18/22 0224 08/19/22 1719 08/20/22 0917 08/21/22 0346  NA 136 140 141 141 145  K 3.4* 4.0 3.6 3.1* 3.6  CL 104 110 102 105 115*  CO2 22 22 27 24 23   GLUCOSE 217* 116* 115* 103* 81  BUN 7* 6* 5* 8 8  CREATININE 1.03* 0.90 1.10* 0.92 0.85  CALCIUM 7.7* 7.9* 8.5* 7.8* 7.7*  MG 1.6* 2.2  --  1.6* 2.5*  PHOS 3.7  --   --  3.5 3.1    Studies: No results found.  Scheduled Meds:  apixaban  2.5 mg Oral BID   atorvastatin  80 mg Oral QHS   budesonide  6 mg Oral Daily   gabapentin  600 mg Oral QHS   insulin aspart  0-15 Units Subcutaneous TID WC  insulin aspart  0-5 Units Subcutaneous QHS   [START ON 08/22/2022] insulin glargine-yfgn  12 Units Subcutaneous Daily   pantoprazole  40 mg Oral Daily   repaglinide  1 mg Oral QAC lunch   saccharomyces boulardii  250 mg Oral BID   Continuous Infusions:  ceFEPime (MAXIPIME) IV 2 g (08/21/22 0538)   PRN Meds: acetaminophen **OR** acetaminophen, benzonatate, gabapentin, loperamide, morphine injection, ondansetron **OR** ondansetron (ZOFRAN) IV, mouth rinse  Time spent: 50 minutes  Author: Val Riles. MD [facility] 08/21/2022 2:15 PM  To reach On-call, see care teams to locate the attending and reach out to them via [email or web address]. If 7PM-7AM, please contact night-coverage If you still have difficulty reaching the attending provider, please page the [facility] (Director on Call) for [facility] on amion for assistance.

## 2022-08-22 DIAGNOSIS — A419 Sepsis, unspecified organism: Secondary | ICD-10-CM | POA: Diagnosis not present

## 2022-08-22 LAB — GLUCOSE, CAPILLARY
Glucose-Capillary: 93 mg/dL (ref 70–99)
Glucose-Capillary: 127 mg/dL — ABNORMAL HIGH (ref 70–99)
Glucose-Capillary: 166 mg/dL — ABNORMAL HIGH (ref 70–99)
Glucose-Capillary: 116 mg/dL — ABNORMAL HIGH (ref 70–99)

## 2022-08-22 LAB — CBC
HCT: 35.7 % — ABNORMAL LOW (ref 36.0–46.0)
Hemoglobin: 11 g/dL — ABNORMAL LOW (ref 12.0–15.0)
MCH: 27 pg (ref 26.0–34.0)
MCHC: 30.8 g/dL (ref 30.0–36.0)
MCV: 87.7 fL (ref 80.0–100.0)
Platelets: 511 10*3/uL — ABNORMAL HIGH (ref 150–400)
RBC: 4.07 MIL/uL (ref 3.87–5.11)
RDW: 19.3 % — ABNORMAL HIGH (ref 11.5–15.5)
WBC: 11 10*3/uL — ABNORMAL HIGH (ref 4.0–10.5)
nRBC: 0.2 % (ref 0.0–0.2)

## 2022-08-22 LAB — BASIC METABOLIC PANEL
Anion gap: 6 (ref 5–15)
BUN: 8 mg/dL (ref 8–23)
CO2: 23 mmol/L (ref 22–32)
Calcium: 7.8 mg/dL — ABNORMAL LOW (ref 8.9–10.3)
Chloride: 114 mmol/L — ABNORMAL HIGH (ref 98–111)
Creatinine, Ser: 1.03 mg/dL — ABNORMAL HIGH (ref 0.44–1.00)
GFR, Estimated: 58 mL/min — ABNORMAL LOW (ref >60)
Glucose, Bld: 88 mg/dL (ref 70–99)
Potassium: 3.6 mmol/L (ref 3.5–5.1)
Sodium: 143 mmol/L (ref 135–145)

## 2022-08-22 LAB — CORTISOL: Cortisol, Plasma: 7.4 ug/dL

## 2022-08-22 LAB — PHOSPHORUS: Phosphorus: 3.1 mg/dL (ref 2.5–4.6)

## 2022-08-22 LAB — MAGNESIUM: Magnesium: 2.4 mg/dL (ref 1.7–2.4)

## 2022-08-22 LAB — CORTISOL-AM, BLOOD: Cortisol - AM: 0.9 ug/dL — ABNORMAL LOW (ref 6.7–22.6)

## 2022-08-22 MED ORDER — COSYNTROPIN 0.25 MG IJ SOLR
0.2500 mg | Freq: Once | INTRAMUSCULAR | Status: AC
  Administered 2022-08-22: 0.25 mg via INTRAVENOUS
  Filled 2022-08-22: qty 0.25

## 2022-08-22 NOTE — Progress Notes (Signed)
[facility] Progress Note  Patient: Debra Leach    [id number]  DOA: 08/19/2022     Date of Service: the patient was seen and examined on 08/22/2022  Chief Complaint  Patient presents with   Emesis   Brief hospital course: Yazlynn Swierk is a 72 y.o. 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  Urine culture from 3/13 showed multiple species with recommendation for recollection.  Blood cultures from 3/11 were negative, respiratory viral panel from 3/11 was also negative. ED course and data review: Tmax 103.2 with pulse 122, respirations 23.  Labs notable for WBC 20,400, up from 8.3 on discharge with lactic acid 2.4, up from 1.6, 3 days ago.  Creatinine 1.1 up from baseline of 0.9 when discharged a day prior. Chest x-ray nonacute Patient started on sepsis protocol with IV fluid bolus, cefepime metronidazole and Flagyl for sepsis of unknown source.  Hospitalist consulted for admission.   Assessment and Plan:  Sepsis UTI and left lower extremity cellulitis Sepsis criteria includes fever of 103.2, tachycardia of 122 and tachypnea of 23 with WBC 20,000 up from 8300 on discharge the day prior with lactic acid 2.4 Patient presented with nausea, vomiting and diarrhea. C. difficile and GI pathogen negative.  Started Imodium as needed, continue probiotics CT A/P negative for any acute findings.  CXR negative S/p vancomycin and Flagyl, patient developed allergic reaction, developed lip swelling on 3/16 so discontinued vancomycin and Flagyl.   Continue antibiotics cefepime, pharmacy  consulted for dosing Hold immunosuppressant medications UA not very impressive,  urine culture NGTD and blood cultures NGTD Due to lip swelling, suspected allergic reaction to vancomycin, Benadryl 25 mg IV one-time dose and Pepcid 20 mg IV one-time dose given. 3/15 cortisol a.m. level 1.5 very low, TSH 0.45 at lower end Follow  cortisol level p.m. today and repeat cortisol a.m. tomorrow   Low cortisol level 3/16 cortisol a.m. 1.5 and cortisol p.m. 0.7 3/17 cortisol a.m. 0.9 3/17 follow cosyntropin stimulation test   Hypokalemia, potassium repleted Hypomagnesemia, mag repleted Monitor electrolytes and replete as needed.  Chronic kidney disease, stage 3a ([place]) Renal function at baseline   Type 2 diabetes mellitus with stage 3a chronic kidney disease, with long-term current use of insulin (HCC) Continue basal insulin Sliding scale insulin coverage Continue Prandin   History of pulmonary embolism Continue Eliquis   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HCC) Long-term current use of immunosuppressive medication Patient is on long-term budesonide and azathioprine Will hold azathioprine in the setting of acute infection Resumed budesonide on patient's request, she stated that pancreatitis is going to flareup very quickly if she stops taking budesonide   Migraine with aura On Ubrelvy   Hyperlipidemia associated with type 2 diabetes mellitus ([place]) Continue statin   Body mass index is 28.8 kg/m.  Interventions:       Diet: Diabetic diet DVT Prophylaxis: Therapeutic Anticoagulation with Eliquis    Advance goals of care discussion: Full code  Family Communication: family was not present at bedside, at the time of interview.  The pt provided permission to discuss medical plan with the family. Opportunity was given to ask question and all questions were answered  satisfactorily.   Disposition:  Pt is from Home, admitted with sepsis, still on IV antibiotics, which precludes a safe  discharge. Discharge to home, when clinically stable, may discharge tomorrow a.m.   Subjective: No significant events overnight, diarrhea is improving, patient had 6 episodes of bowel movement yesterday 3 times liquidy and 3 times soft, today patient had 1 bowel movement which was soft, no diarrhea today.  Denies any abdominal pain.  Lower extremity cellulitis is improving, denies any worsening of redness or pain.  No tenderness.  Denies any chest pain or palpitation no shortness of breath.  Lip swelling is improved.  Denies any worsening of allergic reaction.    Physical Exam: General: NAD, lying comfortably Appear in no distress, affect appropriate Eyes: PERRLA ENT: Oral Mucosa Clear, moist  Neck: no JVD,  Cardiovascular: S1 and S2 Present, no Murmur,  Respiratory: good respiratory effort, Bilateral Air entry equal and Decreased, no Crackles, no wheezes Abdomen: Bowel Sound present, Soft and no tenderness,  Skin: no rashes Extremities: Pedal edema, no calf tenderness, LLE mild erythema and tenderness, cellulitis is gradually improving Right hand Cast, patient is following with hand surgery as an outpatient Neurologic: without any new focal findings Gait not checked due to patient safety concerns  Vitals:   08/21/22 1010 08/21/22 1505 08/22/22 0021 08/22/22 0815  BP: (!) 161/86 (!) 144/74 114/60 (!) 132/59  Pulse: 97 97 86 80  Resp: 20 17 16 16   Temp: 98.4 F (36.9 C) 99.1 F (37.3 C) 98.9 F (37.2 C) 98.8 F (37.1 C)  TempSrc: Oral     SpO2: 95% 94% 93% 93%  Weight:      Height:        Intake/Output Summary (Last 24 hours) at 08/22/2022 1322 Last data filed at 08/21/2022 1515 Gross per 24 hour  Intake 200 ml  Output --  Net 200 ml   Filed Weights   08/19/22 1731  Weight: 66.9 kg    Data Reviewed: I have personally reviewed and interpreted daily labs, tele strips, imagings as discussed above. I reviewed all nursing notes, pharmacy notes, vitals, pertinent old  records I have discussed plan of care as described above with RN and patient/family.  CBC: Recent Labs  Lab 08/16/22 1213 08/16/22 1920 08/16/22 1932 08/18/22 0224 08/19/22 1719 08/20/22 0917 08/21/22 0346 08/22/22 0521  WBC 8.6 12.8*   < > 8.3 20.4* 13.0* 10.6* 11.0*  NEUTROABS 6.8 11.6*  --   --  18.8*  --   --   --   HGB 13.3 11.6*   < > 10.9* 12.6 10.1* 9.8* 11.0*  HCT 40.7 36.3   < > 33.7* 41.1 32.6* 31.3* 35.7*  MCV 86.2 87.5   < > 86.6 87.8 87.6 86.7 87.7  PLT 289.0 224   < > 234 372 311 366 511*   < > = values in this interval not displayed.   Basic Metabolic Panel: Recent Labs  Lab 08/17/22 0210 08/18/22 0224 08/19/22 1719 08/20/22 0917 08/21/22 0346 08/22/22 0521  NA 136 140 141 141 145 143  K 3.4* 4.0 3.6 3.1* 3.6 3.6  CL 104 110 102 105 115* 114*  CO2 22 22 27 24 23 23   GLUCOSE 217* 116* 115* 103* 81 88  BUN 7* 6* 5* 8 8 8   CREATININE 1.03* 0.90 1.10* 0.92 0.85 1.03*  CALCIUM 7.7* 7.9* 8.5* 7.8* 7.7* 7.8*  MG 1.6* 2.2  --  1.6* 2.5* 2.4  PHOS 3.7  --   --  3.5 3.1 3.1    Studies: No results found.  Scheduled Meds:  apixaban  2.5 mg Oral BID   atorvastatin  80 mg Oral QHS   budesonide  6 mg Oral Daily   famotidine  40 mg Oral BID   Followed by   Derrill Memo ON 08/24/2022] pantoprazole  40 mg Oral Daily   gabapentin  600 mg Oral QHS   insulin aspart  0-15 Units Subcutaneous TID WC   insulin aspart  0-5 Units Subcutaneous QHS   insulin glargine-yfgn  12 Units Subcutaneous Daily   repaglinide  1 mg Oral QAC lunch   saccharomyces boulardii  250 mg Oral BID   Continuous Infusions:  ceFEPime (MAXIPIME) IV 2 g (08/22/22 0515)   PRN Meds: acetaminophen **OR** acetaminophen, benzonatate, diphenhydrAMINE, gabapentin, loperamide, morphine injection, ondansetron **OR** ondansetron (ZOFRAN) IV, mouth rinse  Time spent: 55 minutes  Author: Val Riles. MD [facility] 08/22/2022 1:22 PM  To reach On-call, see care teams to locate the attending and  reach out to them via [email or web address]. If 7PM-7AM, please contact night-coverage If you still have difficulty reaching the attending provider, please page the [facility] (Director on Call) for [facility] on amion for assistance.

## 2022-08-23 ENCOUNTER — Ambulatory Visit: Payer: Medicare Other

## 2022-08-23 DIAGNOSIS — A419 Sepsis, unspecified organism: Secondary | ICD-10-CM | POA: Diagnosis not present

## 2022-08-23 LAB — BASIC METABOLIC PANEL
Anion gap: 10 (ref 5–15)
BUN: 7 mg/dL — ABNORMAL LOW (ref 8–23)
CO2: 23 mmol/L (ref 22–32)
Calcium: 7.6 mg/dL — ABNORMAL LOW (ref 8.9–10.3)
Chloride: 111 mmol/L (ref 98–111)
Creatinine, Ser: 0.89 mg/dL (ref 0.44–1.00)
GFR, Estimated: >60 mL/min (ref >60)
Glucose, Bld: 86 mg/dL (ref 70–99)
Potassium: 3.1 mmol/L — ABNORMAL LOW (ref 3.5–5.1)
Sodium: 144 mmol/L (ref 135–145)

## 2022-08-23 LAB — CBC
HCT: 30.8 % — ABNORMAL LOW (ref 36.0–46.0)
Hemoglobin: 9.6 g/dL — ABNORMAL LOW (ref 12.0–15.0)
MCH: 27.1 pg (ref 26.0–34.0)
MCHC: 31.2 g/dL (ref 30.0–36.0)
MCV: 87 fL (ref 80.0–100.0)
Platelets: 490 10*3/uL — ABNORMAL HIGH (ref 150–400)
RBC: 3.54 MIL/uL — ABNORMAL LOW (ref 3.87–5.11)
RDW: 19.1 % — ABNORMAL HIGH (ref 11.5–15.5)
WBC: 9.8 10*3/uL (ref 4.0–10.5)
nRBC: 0.5 % — ABNORMAL HIGH (ref 0.0–0.2)

## 2022-08-23 LAB — GLUCOSE, CAPILLARY
Glucose-Capillary: 131 mg/dL — ABNORMAL HIGH (ref 70–99)
Glucose-Capillary: 138 mg/dL — ABNORMAL HIGH (ref 70–99)
Glucose-Capillary: 85 mg/dL (ref 70–99)

## 2022-08-23 LAB — MAGNESIUM: Magnesium: 2.1 mg/dL (ref 1.7–2.4)

## 2022-08-23 LAB — PHOSPHORUS: Phosphorus: 2.8 mg/dL (ref 2.5–4.6)

## 2022-08-23 MED ORDER — POTASSIUM CHLORIDE CRYS ER 20 MEQ PO TBCR
40.0000 meq | EXTENDED_RELEASE_TABLET | ORAL | Status: AC
  Administered 2022-08-23 (×2): 40 meq via ORAL
  Filled 2022-08-23 (×2): qty 2

## 2022-08-23 MED ORDER — INSULIN ASPART 100 UNIT/ML IJ SOLN
0.0000 [IU] | Freq: Three times a day (TID) | INTRAMUSCULAR | Status: DC
  Administered 2022-08-23: 1 [IU] via SUBCUTANEOUS
  Filled 2022-08-23: qty 1

## 2022-08-23 MED ORDER — ACETAMINOPHEN 325 MG PO TABS
650.0000 mg | ORAL_TABLET | Freq: Four times a day (QID) | ORAL | Status: DC | PRN
  Administered 2022-08-23 – 2022-08-24 (×3): 650 mg via ORAL
  Filled 2022-08-23 (×4): qty 2

## 2022-08-23 MED ORDER — POTASSIUM CHLORIDE CRYS ER 20 MEQ PO TBCR
40.0000 meq | EXTENDED_RELEASE_TABLET | ORAL | Status: DC
  Filled 2022-08-23: qty 2

## 2022-08-23 MED ORDER — DIPHENHYDRAMINE HCL 25 MG PO CAPS: 25.0000 mg | ORAL_CAPSULE | Freq: Three times a day (TID) | ORAL | Status: DC | PRN

## 2022-08-23 MED ORDER — POTASSIUM CHLORIDE 10 MEQ/100ML IV SOLN
10.0000 meq | INTRAVENOUS | Status: DC
  Administered 2022-08-23: 10 meq via INTRAVENOUS
  Filled 2022-08-23 (×2): qty 100

## 2022-08-23 MED ORDER — ACETAMINOPHEN 650 MG RE SUPP: 650.0000 mg | Freq: Four times a day (QID) | RECTAL | Status: DC | PRN

## 2022-08-23 MED ORDER — HYDROCORTISONE 5 MG PO TABS
5.0000 mg | ORAL_TABLET | Freq: Every day | ORAL | Status: DC
  Administered 2022-08-23: 5 mg via ORAL
  Filled 2022-08-23 (×2): qty 1

## 2022-08-23 MED ORDER — HYDROCORTISONE 10 MG PO TABS
10.0000 mg | ORAL_TABLET | Freq: Every day | ORAL | Status: DC
  Administered 2022-08-23 – 2022-08-24 (×2): 10 mg via ORAL
  Filled 2022-08-23 (×2): qty 1

## 2022-08-23 NOTE — Progress Notes (Signed)
[facility] Progress Note  Patient: Debra Leach    [id number]  DOA: 08/19/2022     Date of Service: the patient was seen and examined on 08/23/2022  Chief Complaint  Patient presents with   Emesis   Brief hospital course: Debra Leach is a 72 y.o. 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  Urine culture from 3/13 showed multiple species with recommendation for recollection.  Blood cultures from 3/11 were negative, respiratory viral panel from 3/11 was also negative. ED course and data review: Tmax 103.2 with pulse 122, respirations 23.  Labs notable for WBC 20,400, up from 8.3 on discharge with lactic acid 2.4, up from 1.6, 3 days ago.  Creatinine 1.1 up from baseline of 0.9 when discharged a day prior. Chest x-ray nonacute Patient started on sepsis protocol with IV fluid bolus, cefepime metronidazole and Flagyl for sepsis of unknown source.  Hospitalist consulted for admission.   Assessment and Plan:  Sepsis UTI and left lower extremity cellulitis Sepsis criteria includes fever of 103.2, tachycardia of 122 and tachypnea of 23 with WBC 20,000 up from 8300 on discharge the day prior with lactic acid 2.4 Patient presented with nausea, vomiting and diarrhea. C. difficile and GI pathogen negative.  Started Imodium as needed, continue probiotics CT A/P negative for any acute findings.  CXR negative S/p vancomycin and Flagyl, patient developed allergic reaction, developed lip swelling on 3/16 so discontinued vancomycin and Flagyl.   Continue antibiotics cefepime, pharmacy  consulted for dosing Hold immunosuppressant medications UA not very impressive,  urine culture NGTD and blood cultures NGTD Due to lip swelling, suspected allergic reaction to vancomycin, Benadryl 25 mg IV one-time dose and Pepcid 20 mg IV one-time dose given. 3/15 cortisol a.m. level 1.5 very low, TSH 0.45 at lower end   Chronic adrenal insufficiency 3/16 cortisol a.m. 1.5 and cortisol p.m. 0.7 3/17 cortisol a.m. 0.9 3/17 cosyntropin stimulation test, cortisol level 7.4 3/18 chest follow ACTH and DHEAS level 3/18 started on hydrocortisone 10 mg p.o. in the a.m. and 5 mg in the afternoon Recommended to follow with endocrinologist as an outpatient for further management   Hypokalemia, potassium repleted Hypomagnesemia, mag repleted Monitor electrolytes and replete as needed.  Chronic kidney disease, stage 3a ([place]) Renal function at baseline   Type 2 diabetes mellitus with stage 3a chronic kidney disease, with long-term current use of insulin Continue basal insulin Sliding scale insulin coverage Continue Prandin   History of pulmonary embolism Continue Eliquis   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HCC) Long-term current use of immunosuppressive medication Patient is on long-term budesonide and azathioprine Will hold azathioprine in the setting of acute infection Resumed budesonide on patient's request, she stated that pancreatitis is going to flareup very quickly if she stops taking budesonide   Migraine with aura On Ubrelvy   Hyperlipidemia associated with type 2 diabetes mellitus ([place]) Continue statin   Body mass index is 28.8 kg/m.  Interventions:       Diet: Diabetic diet DVT Prophylaxis: Therapeutic Anticoagulation with Eliquis    Advance goals of care discussion: Full code  Family Communication: family was not present at bedside, at the time of  interview.  The pt provided permission to discuss medical plan with the family. Opportunity was given to ask question and all  questions were answered satisfactorily.   Disposition:  Pt is from Home, admitted with sepsis, still on IV antibiotics, which precludes a safe discharge. Discharge to home, when clinically stable, may discharge tomorrow a.m.   Subjective: No significant events overnight, patient stated that she is just feeling generalized weakness and tiredness, lack of energy otherwise no new complaints.   Physical Exam: General: NAD, lying comfortably Appear in no distress, affect appropriate Eyes: PERRLA ENT: Oral Mucosa Clear, moist  Neck: no JVD,  Cardiovascular: S1 and S2 Present, no Murmur,  Respiratory: good respiratory effort, Bilateral Air entry equal and Decreased, no Crackles, no wheezes Abdomen: Bowel Sound present, Soft and no tenderness,  Skin: no rashes Extremities: Pedal edema, no calf tenderness, LLE mild erythema,, no tenderness  Right hand Cast, patient is following with hand surgery as an outpatient Neurologic: without any new focal findings Gait not checked due to patient safety concerns  Vitals:   08/22/22 0815 08/22/22 1613 08/23/22 0046 08/23/22 0834  BP: (!) 132/59 (!) 147/75 139/67 (!) 156/69  Pulse: 80 78 84 87  Resp: 16 16 16 17   Temp: 98.8 F (37.1 C) 98.9 F (37.2 C) 98.4 F (36.9 C) 98.9 F (37.2 C)  TempSrc:      SpO2: 93% 93% 96% 99%  Weight:      Height:        Intake/Output Summary (Last 24 hours) at 08/23/2022 1426 Last data filed at 08/22/2022 1944 Gross per 24 hour  Intake --  Output 0 ml  Net 0 ml   Filed Weights   08/19/22 1731  Weight: 66.9 kg    Data Reviewed: I have personally reviewed and interpreted daily labs, tele strips, imagings as discussed above. I reviewed all nursing notes, pharmacy notes, vitals, pertinent old records I have discussed plan of care as described above with RN and patient/family.  CBC: Recent Labs  Lab 08/16/22 1920 08/16/22 1932 08/19/22 1719 08/20/22 0917 08/21/22 0346 08/22/22 0521 08/23/22 0416   WBC 12.8*   < > 20.4* 13.0* 10.6* 11.0* 9.8  NEUTROABS 11.6*  --  18.8*  --   --   --   --   HGB 11.6*   < > 12.6 10.1* 9.8* 11.0* 9.6*  HCT 36.3   < > 41.1 32.6* 31.3* 35.7* 30.8*  MCV 87.5   < > 87.8 87.6 86.7 87.7 87.0  PLT 224   < > 372 311 366 511* 490*   < > = values in this interval not displayed.   Basic Metabolic Panel: Recent Labs  Lab 08/17/22 0210 08/18/22 0224 08/19/22 1719 08/20/22 0917 08/21/22 0346 08/22/22 0521 08/23/22 0416  NA 136 140 141 141 145 143 144  K 3.4* 4.0 3.6 3.1* 3.6 3.6 3.1*  CL 104 110 102 105 115* 114* 111  CO2 22 22 27 24 23 23 23   GLUCOSE 217* 116* 115* 103* 81 88 86  BUN 7* 6* 5* 8 8 8  7*  CREATININE 1.03* 0.90 1.10* 0.92 0.85 1.03* 0.89  CALCIUM 7.7* 7.9* 8.5* 7.8* 7.7* 7.8* 7.6*  MG 1.6* 2.2  --  1.6* 2.5* 2.4 2.1  PHOS 3.7  --   --  3.5 3.1 3.1 2.8    Studies: No results found.  Scheduled Meds:  apixaban  2.5 mg Oral BID   atorvastatin  80 mg Oral QHS   budesonide  6 mg Oral Daily   gabapentin  600 mg Oral QHS   hydrocortisone  10 mg Oral Daily   And   hydrocortisone  5 mg Oral Q1400   insulin aspart  0-5 Units Subcutaneous QHS   insulin aspart  0-9 Units Subcutaneous TID WC   insulin glargine-yfgn  12 Units Subcutaneous Daily   [START ON 08/24/2022] pantoprazole  40 mg Oral Daily   potassium chloride  40 mEq Oral Q4H   saccharomyces boulardii  250 mg Oral BID   Continuous Infusions:  ceFEPime (MAXIPIME) IV 2 g (08/23/22 0536)   PRN Meds: acetaminophen **OR** acetaminophen, benzonatate, diphenhydrAMINE, gabapentin, loperamide, morphine injection, ondansetron **OR** ondansetron (ZOFRAN) IV, mouth rinse  Time spent: 50 minutes  Author: Val Riles. MD [facility] 08/23/2022 2:26 PM  To reach On-call, see care teams to locate the attending and reach out to them via [email or web address]. If 7PM-7AM, please contact night-coverage If you still have difficulty reaching the attending provider, please page the [facility] (Director  on Call) for [facility] on amion for assistance.

## 2022-08-23 NOTE — Progress Notes (Signed)
Pharmacy Antibiotic Note Debra Leach is a 72 y.o. 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 She was discharged on 08/18/2022 after brief hospitalization for sepsis where she received vancomycin, cefepime, and ceftriaxone. She was discharged on cefdinir. She returned to ED the following day by EMS for nausea, vomiting, and fever and re-admitted for sepsis. Pharmacy has been consulted for cefepime dosing.  Relevant labs: currently afebrile, Scr 0.89, CBC: WBC decreasing  Plan: Day 5 of antibiotics this admission Continue cefepime 2 g IV Q12H Continue to monitor renal function    Height: 5' (152.4 cm) Weight: 66.9 kg (147 lb 7.8 oz) IBW/kg (Calculated) : 45.5  Temp (24hrs), Avg:98.7 F (37.1 C), Min:98.4 F (36.9 C), Max:98.9 F (37.2 C)  Recent Labs  Lab 08/16/22 1920 08/16/22 1932 08/16/22 2245 08/17/22 0210 08/19/22 1719 08/19/22 2308 08/20/22 0917 08/21/22 0346 08/22/22 0521 08/23/22 0416  WBC 12.8*  --   --    < > 20.4*  --  13.0* 10.6* 11.0* 9.8  CREATININE 1.13*   < >  --    < > 1.10*  --  0.92 0.85 1.03* 0.89  LATICACIDVEN 2.0*  --  1.6  --  2.4* 1.1  --   --   --   --    < > = values in this interval not displayed.     Estimated Creatinine Clearance: 49.5 mL/min (by C-G formula based on SCr of 0.89 mg/dL).    Allergies  Allergen Reactions   Sudafed [Pseudoephedrine Hcl] Hives, Itching and Anxiety   Atenolol Other (See Comments)    Migraine   Codeine Itching and Swelling    REACTION: Migraine   Epinephrine Hives   Hydrocodone Other (See Comments)    Migraine   Ibuprofen Other (See Comments)    Migraine   Jardiance [Empagliflozin] Other (See Comments)    Caused uti Yeast infection   Oxycodone Other (See Comments)    Migraine   Penicillins Itching and Swelling    ** Tolerates  cephalosporins Has patient had a PCN reaction causing immediate rash, facial/tongue/throat swelling, SOB or lightheadedness with hypotension: No Has patient had a PCN reaction causing severe rash involving mucus membranes or skin necrosis: No Has patient had a PCN reaction that required hospitalization: No Has patient had a PCN reaction occurring within the last 10 years: No     Pentazocine Lactate     REACTION: Swelling, itching, rash   Promethazine Other (See Comments)    migraine   Propranolol Other (See Comments)    Migraine   Talwin [Pentazocine] Other (See Comments)    Swelling and itching    Tramadol Other (See Comments)    Migraine   Crestor [Rosuvastatin] Other (See Comments)    Muscle pain    Antimicrobials this admission: 3/14 Vancomycin >> 3/16 3/14 Metronidazole >> 3/16 3/14 Cefepime >>  Dose adjustments this admission:  N/A  Microbiology results: 3/12 MRSA PCR: Negative  3/14 BCx: NG4D 3/15 UCx: No growth  3/15 C. Difficile PCR: Negative 3/15 GI Panel PCR: No pathogens detected  Thank you for allowing pharmacy to be a part of this patient's care.  Si Raider, PharmD Candidate, Class of 2026 08/23/2022 9:12 AM

## 2022-08-23 NOTE — Progress Notes (Signed)
PHARMACIST - PHYSICIAN COMMUNICATION  DR: Dwyane Dee  CONCERNING: IV to Oral Route Change Policy  RECOMMENDATION: This patient is receiving diphenhydramine by the intravenous route.  Based on criteria approved by the Pharmacy and Therapeutics Committee, the intravenous medication is being converted to the equivalent oral dose form.   DESCRIPTION: These criteria include: The patient is eating (either orally or via tube) and/or has been taking other orally administered medications for a least 24 hours The patient has no evidence of active gastrointestinal bleeding or impaired GI absorption (gastrectomy, short bowel, patient on TNA or NPO).  If you have questions about this conversion, please contact the Pharmacy Department  []   [phone] )  [facility] [x]   [phone] )  [facility] []   [phone] )  [facility] []   [phone] )  [facility] []   [phone] )  [place], PharmD, BCPS Clinical Pharmacist  08/23/2022 9:44 AM

## 2022-08-23 NOTE — Care Management Important Message (Signed)
Important Message  Patient Details  Name: Lissie Glasson MRN: [id number] Date of Birth: [birth year]   Medicare Important Message Given:  Yes     Dannette Barbara 08/23/2022, 1:05 PM

## 2022-08-24 ENCOUNTER — Encounter: Payer: Self-pay | Admitting: Internal Medicine

## 2022-08-24 ENCOUNTER — Encounter: Payer: Self-pay | Admitting: Gastroenterology

## 2022-08-24 ENCOUNTER — Encounter: Payer: Self-pay | Admitting: Family Medicine

## 2022-08-24 DIAGNOSIS — A419 Sepsis, unspecified organism: Secondary | ICD-10-CM | POA: Diagnosis not present

## 2022-08-24 LAB — GLUCOSE, CAPILLARY
Glucose-Capillary: 124 mg/dL — ABNORMAL HIGH (ref 70–99)
Glucose-Capillary: 132 mg/dL — ABNORMAL HIGH (ref 70–99)

## 2022-08-24 LAB — PHOSPHORUS: Phosphorus: 2.5 mg/dL (ref 2.5–4.6)

## 2022-08-24 LAB — BASIC METABOLIC PANEL
Anion gap: 11 (ref 5–15)
BUN: 7 mg/dL — ABNORMAL LOW (ref 8–23)
CO2: 18 mmol/L — ABNORMAL LOW (ref 22–32)
Calcium: 8 mg/dL — ABNORMAL LOW (ref 8.9–10.3)
Chloride: 114 mmol/L — ABNORMAL HIGH (ref 98–111)
Creatinine, Ser: 0.79 mg/dL (ref 0.44–1.00)
GFR, Estimated: >60 mL/min (ref >60)
Glucose, Bld: 121 mg/dL — ABNORMAL HIGH (ref 70–99)
Potassium: 3.8 mmol/L (ref 3.5–5.1)
Sodium: 143 mmol/L (ref 135–145)

## 2022-08-24 LAB — CBC
HCT: 33.4 % — ABNORMAL LOW (ref 36.0–46.0)
Hemoglobin: 10.5 g/dL — ABNORMAL LOW (ref 12.0–15.0)
MCH: 27.3 pg (ref 26.0–34.0)
MCHC: 31.4 g/dL (ref 30.0–36.0)
MCV: 86.8 fL (ref 80.0–100.0)
Platelets: 513 10*3/uL — ABNORMAL HIGH (ref 150–400)
RBC: 3.85 MIL/uL — ABNORMAL LOW (ref 3.87–5.11)
RDW: 19.4 % — ABNORMAL HIGH (ref 11.5–15.5)
WBC: 10.2 10*3/uL (ref 4.0–10.5)
nRBC: 0.9 % — ABNORMAL HIGH (ref 0.0–0.2)

## 2022-08-24 LAB — CULTURE, BLOOD (ROUTINE X 2)
Culture: NO GROWTH
Culture: NO GROWTH

## 2022-08-24 LAB — ACTH: C206 ACTH: 6 pg/mL — ABNORMAL LOW (ref 7.2–63.3)

## 2022-08-24 LAB — DHEA-SULFATE: DHEA-SO4: 1.1 ug/dL — ABNORMAL LOW (ref 20.4–186.6)

## 2022-08-24 LAB — MAGNESIUM: Magnesium: 2.3 mg/dL (ref 1.7–2.4)

## 2022-08-24 MED ORDER — HYDROCORTISONE 10 MG PO TABS: 10.0000 mg | ORAL_TABLET | Freq: Every day | ORAL | 3 refills | Status: DC

## 2022-08-24 MED ORDER — HYDROCORTISONE 5 MG PO TABS: 5.0000 mg | ORAL_TABLET | Freq: Every day | ORAL | 3 refills | Status: DC

## 2022-08-24 NOTE — Plan of Care (Signed)

## 2022-08-24 NOTE — Plan of Care (Signed)
Problem: Education: Goal: Ability to describe self-care measures that may prevent or decrease complications (Diabetes Survival Skills Education) will improve 08/24/2022 1101 by Alferd Apa, RN Outcome: Adequate for Discharge 08/24/2022 1055 by Alferd Apa, RN Outcome: Progressing Goal: Individualized Educational Video(s) 08/24/2022 1101 by Alferd Apa, RN Outcome: Adequate for Discharge 08/24/2022 1055 by Alferd Apa, RN Outcome: Progressing   Problem: Coping: Goal: Ability to adjust to condition or change in health will improve 08/24/2022 1101 by Alferd Apa, RN Outcome: Adequate for Discharge 08/24/2022 1055 by Alferd Apa, RN Outcome: Progressing   Problem: Fluid Volume: Goal: Ability to maintain a balanced intake and output will improve 08/24/2022 1101 by Alferd Apa, RN Outcome: Adequate for Discharge 08/24/2022 1055 by Alferd Apa, RN Outcome: Progressing   Problem: Health Behavior/Discharge Planning: Goal: Ability to identify and utilize available resources and services will improve 08/24/2022 1101 by Alferd Apa, RN Outcome: Adequate for Discharge 08/24/2022 1055 by Alferd Apa, RN Outcome: Progressing Goal: Ability to manage health-related needs will improve 08/24/2022 1101 by Alferd Apa, RN Outcome: Adequate for Discharge 08/24/2022 1055 by Alferd Apa, RN Outcome: Progressing   Problem: Metabolic: Goal: Ability to maintain appropriate glucose levels will improve 08/24/2022 1101 by Alferd Apa, RN Outcome: Adequate for Discharge 08/24/2022 1055 by Alferd Apa, RN Outcome: Progressing   Problem: Nutritional: Goal: Maintenance of adequate nutrition will improve 08/24/2022 1101 by Alferd Apa, RN Outcome: Adequate for Discharge 08/24/2022 1055 by Alferd Apa, RN Outcome: Progressing Goal: Progress toward achieving an optimal weight will improve 08/24/2022 1101 by Alferd Apa, RN Outcome: Adequate for Discharge 08/24/2022 1055 by Alferd Apa, RN Outcome: Progressing   Problem: Skin Integrity: Goal: Risk for impaired skin integrity will decrease 08/24/2022 1101 by Alferd Apa, RN Outcome: Adequate for Discharge 08/24/2022 1055 by Alferd Apa, RN Outcome: Progressing   Problem: Tissue Perfusion: Goal: Adequacy of tissue perfusion will improve 08/24/2022 1101 by Alferd Apa, RN Outcome: Adequate for Discharge 08/24/2022 1055 by Alferd Apa, RN Outcome: Progressing   Problem: Fluid Volume: Goal: Hemodynamic stability will improve 08/24/2022 1101 by Alferd Apa, RN Outcome: Adequate for Discharge 08/24/2022 1055 by Alferd Apa, RN Outcome: Progressing   Problem: Clinical Measurements: Goal: Diagnostic test results will improve 08/24/2022 1101 by Alferd Apa, RN Outcome: Adequate for Discharge 08/24/2022 1055 by Alferd Apa, RN Outcome: Progressing Goal: Signs and symptoms of infection will decrease 08/24/2022 1101 by Alferd Apa, RN Outcome: Adequate for Discharge 08/24/2022 1055 by Alferd Apa, RN Outcome: Progressing   Problem: Respiratory: Goal: Ability to maintain adequate ventilation will improve 08/24/2022 1101 by Alferd Apa, RN Outcome: Adequate for Discharge 08/24/2022 1055 by Alferd Apa, RN Outcome: Progressing   Problem: Education: Goal: Knowledge of General Education information will improve Description: Including pain rating scale, medication(s)/side effects and non-pharmacologic comfort measures 08/24/2022 1101 by Alferd Apa, RN Outcome: Adequate for Discharge 08/24/2022 1055 by Alferd Apa, RN Outcome: Progressing   Problem: Health Behavior/Discharge Planning: Goal: Ability to manage health-related needs will improve 08/24/2022 1101 by Alferd Apa, RN Outcome: Adequate for Discharge 08/24/2022 1055 by Alferd Apa, RN Outcome: Progressing   Problem: Clinical Measurements: Goal: Ability to maintain clinical measurements within normal limits will  improve 08/24/2022 1101 by Alferd Apa, RN Outcome: Adequate for Discharge 08/24/2022 1055 by Alferd Apa, RN Outcome: Progressing Goal: Will remain free  from infection 08/24/2022 1101 by Alferd Apa, RN Outcome: Adequate for Discharge 08/24/2022 1055 by Alferd Apa, RN Outcome: Progressing Goal: Diagnostic test results will improve 08/24/2022 1101 by Alferd Apa, RN Outcome: Adequate for Discharge 08/24/2022 1055 by Alferd Apa, RN Outcome: Progressing Goal: Respiratory complications will improve 08/24/2022 1101 by Alferd Apa, RN Outcome: Adequate for Discharge 08/24/2022 1055 by Alferd Apa, RN Outcome: Progressing Goal: Cardiovascular complication will be avoided 08/24/2022 1101 by Alferd Apa, RN Outcome: Adequate for Discharge 08/24/2022 1055 by Alferd Apa, RN Outcome: Progressing   Problem: Activity: Goal: Risk for activity intolerance will decrease 08/24/2022 1101 by Alferd Apa, RN Outcome: Adequate for Discharge 08/24/2022 1055 by Alferd Apa, RN Outcome: Progressing   Problem: Nutrition: Goal: Adequate nutrition will be maintained 08/24/2022 1101 by Alferd Apa, RN Outcome: Adequate for Discharge 08/24/2022 1055 by Alferd Apa, RN Outcome: Progressing   Problem: Coping: Goal: Level of anxiety will decrease 08/24/2022 1101 by Alferd Apa, RN Outcome: Adequate for Discharge 08/24/2022 1055 by Alferd Apa, RN Outcome: Progressing   Problem: Elimination: Goal: Will not experience complications related to bowel motility 08/24/2022 1101 by Alferd Apa, RN Outcome: Adequate for Discharge 08/24/2022 1055 by Alferd Apa, RN Outcome: Progressing Goal: Will not experience complications related to urinary retention 08/24/2022 1101 by Alferd Apa, RN Outcome: Adequate for Discharge 08/24/2022 1055 by Alferd Apa, RN Outcome: Progressing   Problem: Pain Managment: Goal: General experience of comfort will improve 08/24/2022 1101 by Alferd Apa, RN Outcome: Adequate for Discharge 08/24/2022 1055 by Alferd Apa, RN Outcome: Progressing   Problem: Safety: Goal: Ability to remain free from injury will improve 08/24/2022 1101 by Alferd Apa, RN Outcome: Adequate for Discharge 08/24/2022 1055 by Alferd Apa, RN Outcome: Progressing   Problem: Skin Integrity: Goal: Risk for impaired skin integrity will decrease 08/24/2022 1101 by Alferd Apa, RN Outcome: Adequate for Discharge 08/24/2022 1055 by Alferd Apa, RN Outcome: Progressing

## 2022-08-24 NOTE — Discharge Summary (Signed)
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 Follow up LABS/TEST: BMP in 1 week   Diet recommendation: Carb modified diet  Activity: The patient is advised to gradually reintroduce usual activities, as tolerated  Discharge Condition: stable  Code Status: Full code   History of present illness: As per the H and P dictated on admission. Hospital Course:  Debra Leach is a 72 y.o. 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  Urine culture from 3/13 showed multiple species with recommendation for recollection.  Blood cultures from 3/11 were negative, respiratory viral panel from 3/11 was also negative. ED course and data review: Tmax  103.2 with pulse 122, respirations 23.  Labs notable for WBC 20,400, up from 8.3 on discharge with lactic acid 2.4, up from 1.6, 3 days ago.  Creatinine 1.1 up from baseline of 0.9 when discharged a day prior. Chest x-ray nonacute Patient was started on sepsis protocol with IV fluid bolus, cefepime metronidazole and Flagyl for sepsis of unknown source.  Hospitalist consulted for admission.    Assessment and Plan: # Sepsis UTI and left lower extremity cellulitis Sepsis criteria includes fever of 103.2, tachycardia of 122 and tachypnea of 23 with WBC 20,000 up from 8300 on discharge the day prior with lactic acid 2.4 Patient presented with nausea, vomiting and diarrhea. C. difficile and GI pathogen negative. S/p Imodium as needed, and s/p Probiotics CT A/P negative for any acute findings.  CXR negative. S/p vancomycin and Flagyl, patient developed allergic reaction, developed lip swelling on 3/16 so discontinued vancomycin and Flagyl.  S/p cefepime for 5 days given during hospital stay.  Patient does not need more antibiotics on discharge. Hold immunosuppressant medications during hospital stay, resumed on discharge. UA not very impressive,  urine culture NGTD and blood cultures NGTD. Due to lip swelling, suspected allergic reaction to vancomycin, Benadryl 25 mg IV one-time dose and Pepcid 20 mg IV one-time dose given. On 3/15 cortisol a.m. level 1.5 very low, TSH 0.45 at lower end.  Cellulitis resolved, vital signs stable.   # Chronic adrenal insufficiency 3/16 cortisol a.m. 1.5 and cortisol p.m. 0.7 3/17 cortisol a.m. 0.9 3/17 cosyntropin stimulation test, cortisol level 7.4 3/18 follow ACTH still in process and DHEAS level 1.1 very low 3/18 started on hydrocortisone 10 mg p.o. in the  a.m. and 5 mg in the afternoon Recommended to follow with endocrinologist as an outpatient for further management # Hypokalemia, potassium repleted. Resolved  # Hypomagnesemia, mag repleted. Resolved # Chronic kidney  disease, stage 3a, Renal function at baseline # Type 2 diabetes mellitus with stage 3a chronic kidney disease, with long-term current use of insulin, resumed home dose insulin and Prandin.  Continue diabetic diet.  Monitor CBG at home and follow with PCP and endocrine as an outpatient. # History of pulmonary embolism, Continue Eliquis # Autoimmune pancreatitis. Long-term current use of immunosuppressive medication. Patient is on long-term budesonide and azathioprine. Held azathioprine in the setting of acute infection.  Resumed on discharge. Resumed budesonide on patient's request, she stated that pancreatitis is going to flareup very quickly if she stops taking budesonide # Migraine with aura, On Ubrelvy # Hyperlipidemia associated with type 2 diabetes mellitus, Continue statin  Body mass index is 28.8 kg/m.  Nutrition Interventions:   Patient was ambulatory without any assistance. On the day of the discharge the patient's vitals were stable, and no other acute medical condition were reported by patient. the patient was felt safe to be discharge at Home.  Consultants: None Procedures: None  Discharge Exam: General: Appear in no distress, no Rash; Oral Mucosa Clear, moist. Cardiovascular: S1 and S2 Present, no Murmur, Respiratory: normal respiratory effort, Bilateral Air entry present and no Crackles, no wheezes Abdomen: Bowel Sound present, Soft and no tenderness, no hernia Extremities: no Pedal edema, no calf tenderness Neurology: alert and oriented to time, place, and person affect appropriate.  Filed Weights   08/19/22 1731  Weight: 66.9 kg   Vitals:   08/23/22 2128 08/24/22 0801  BP: (!) 142/74 138/61  Pulse: 79 80  Resp: 16 16  Temp: 99 F (37.2 C) 98.4 F (36.9 C)  SpO2: 95% 93%    DISCHARGE MEDICATION: Allergies as of 08/24/2022       Reactions   Sudafed [pseudoephedrine Hcl] Hives, Itching, Anxiety   Atenolol Other (See Comments)   Migraine   Codeine Itching,  Swelling   REACTION: Migraine   Epinephrine Hives   Hydrocodone Other (See Comments)   Migraine   Ibuprofen Other (See Comments)   Migraine   Jardiance [empagliflozin] Other (See Comments)   Caused uti Yeast infection   Oxycodone Other (See Comments)   Migraine   Penicillins Itching, Swelling   ** Tolerates cephalosporins Has patient had a PCN reaction causing immediate rash, facial/tongue/throat swelling, SOB or lightheadedness with hypotension: No Has patient had a PCN reaction causing severe rash involving mucus membranes or skin necrosis: No Has patient had a PCN reaction that required hospitalization: No Has patient had a PCN reaction occurring within the last 10 years: No   Pentazocine Lactate    REACTION: Swelling, itching, rash   Promethazine Other (See Comments)   migraine   Propranolol Other (See Comments)   Migraine   Talwin [pentazocine] Other (See Comments)   Swelling and itching    Tramadol Other (See Comments)   Migraine   Crestor [rosuvastatin] Other (See Comments)   Muscle pain        Medication List     STOP taking these medications    cefdinir 300 MG capsule Commonly known as: OMNICEF       TAKE these medications    acetaminophen 500 MG tablet Commonly known as: TYLENOL Take 500-1,000 mg by mouth every 6 (six) hours as needed for moderate pain or fever.   atorvastatin 80 MG tablet  Commonly known as: LIPITOR Take 1 tablet (80 mg total) by mouth at bedtime.   azaTHIOprine 50 MG tablet Commonly known as: IMURAN Take 1 tablet (50 mg total) by mouth daily.   budesonide 3 MG 24 hr capsule Commonly known as: ENTOCORT EC Take 2 capsules (6 mg total) by mouth daily.   colesevelam 625 MG tablet Commonly known as: WELCHOL TAKE 3 TABLETS BY MOUTH 2 TIMES DAILY WITH A MEAL. What changed: See the new instructions.   cyanocobalamin 1000 MCG tablet Commonly known as: VITAMIN B12 Take 1,000 mcg by mouth daily with breakfast.   Dexcom G7 Sensor  Misc 1 Device by Does not apply route as directed.   Eliquis 2.5 MG Tabs tablet Generic drug: apixaban TAKE 1 TABLET BY MOUTH 2 TIMES DAILY. What changed: how much to take   ferrous gluconate 324 MG tablet Commonly known as: FERGON Take 1 tablet (324 mg total) by mouth daily with breakfast.   gabapentin 100 MG capsule Commonly known as: NEURONTIN TAKE 1 CAPSULE BY MOUTH UP TO 3 TIMES DAILY AS NEEDED. What changed: See the new instructions.   gabapentin 300 MG capsule Commonly known as: NEURONTIN TAKE 2 CAPSULES BY MOUTH AT BEDTIME. What changed: Another medication with the same name was changed. Make sure you understand how and when to take each.   hydrocortisone 5 MG tablet Commonly known as: CORTEF Take 1 tablet (5 mg total) by mouth daily after lunch.   hydrocortisone 10 MG tablet Commonly known as: CORTEF Take 1 tablet (10 mg total) by mouth daily. Start taking on: August 25, 2022   Lantus SoloStar 100 UNIT/ML Solostar Pen Generic drug: insulin glargine Inject 18 Units into the skin daily.   omeprazole 20 MG capsule Commonly known as: PRILOSEC Take 20 mg by mouth daily with supper.   ondansetron 4 MG tablet Commonly known as: Zofran Take 1 tablet (4 mg total) by mouth every 8 (eight) hours as needed for nausea or vomiting.   OneTouch Delica Plus 123456 Misc USE TO CHECK BLOOD SUGAR TWO TIMES DAILY   OneTouch Verio Reflect w/Device Kit TEST TWICE A DAY   OneTouch Verio test strip Generic drug: glucose blood USE TO CHECK BLOOD SUGAR TWO TIMES A DAY   potassium chloride SA 20 MEQ tablet Commonly known as: KLOR-CON M TAKE 1 TABLET BY MOUTH DAILY.   repaglinide 0.5 MG tablet Commonly known as: PRANDIN Take 2 tablets (1 mg total) by mouth daily before lunch. before breakfast and lunch What changed: additional instructions   Ubrelvy 100 MG Tabs Generic drug: Ubrogepant TAKE 1 TABLET BY MOUTH AS NEEDED (MAY REPEAT DOSE AFTER 2 HOURS IF NEEDED. MAXIMUM 2  TABLETS IN 24 HOURS).   Vitamin D (Ergocalciferol) 1.25 MG (50000 UNIT) Caps capsule Commonly known as: DRISDOL TAKE 1 CAPSULE BY MOUTH ONCE A WEEK.   Vyepti 100 MG/ML injection Generic drug: Eptinezumab-jjmr Inject 300 mg into the vein every 3 (three) months.       Allergies  Allergen Reactions   Sudafed [Pseudoephedrine Hcl] Hives, Itching and Anxiety   Atenolol Other (See Comments)    Migraine   Codeine Itching and Swelling    REACTION: Migraine   Epinephrine Hives   Hydrocodone Other (See Comments)    Migraine   Ibuprofen Other (See Comments)    Migraine   Jardiance [Empagliflozin] Other (See Comments)    Caused uti Yeast infection   Oxycodone Other (See Comments)    Migraine   Penicillins Itching and Swelling    **  Tolerates cephalosporins Has patient had a PCN reaction causing immediate rash, facial/tongue/throat swelling, SOB or lightheadedness with hypotension: No Has patient had a PCN reaction causing severe rash involving mucus membranes or skin necrosis: No Has patient had a PCN reaction that required hospitalization: No Has patient had a PCN reaction occurring within the last 10 years: No     Pentazocine Lactate     REACTION: Swelling, itching, rash   Promethazine Other (See Comments)    migraine   Propranolol Other (See Comments)    Migraine   Talwin [Pentazocine] Other (See Comments)    Swelling and itching    Tramadol Other (See Comments)    Migraine   Crestor [Rosuvastatin] Other (See Comments)    Muscle pain   Discharge Instructions     Call MD for:  extreme fatigue   Complete by: As directed    Call MD for:  persistant dizziness or light-headedness   Complete by: As directed    Call MD for:  redness, tenderness, or signs of infection (pain, swelling, redness, odor or green/yellow discharge around incision site)   Complete by: As directed    Call MD for:  severe uncontrolled pain   Complete by: As directed    Call MD for:  temperature  >100.4   Complete by: As directed    Diet - low sodium heart healthy   Complete by: As directed    Discharge instructions   Complete by: As directed    Follow-up with PCP in 1 week Follow-up with endocrinologist in 1 to 2 weeks for possible chronic adrenal insufficiency and autoimmune pancreatitis Follow-up with a hand surgeon as per schedule.   Increase activity slowly   Complete by: As directed        The results of significant diagnostics from this hospitalization (including imaging, microbiology, ancillary and laboratory) are listed below for reference.    Significant Diagnostic Studies: CT ABDOMEN PELVIS WO CONTRAST  Result Date: 08/19/2022 CLINICAL DATA:  Comes to the ED for nausea and vomiting. Released from hospital yesterday for UTI. EXAM: CT ABDOMEN AND PELVIS WITHOUT CONTRAST TECHNIQUE: Multidetector CT imaging of the abdomen and pelvis was performed following the standard protocol without IV contrast. RADIATION DOSE REDUCTION: This exam was performed according to the departmental dose-optimization program which includes automated exposure control, adjustment of the mA and/or kV according to patient size and/or use of iterative reconstruction technique. COMPARISON:  MRI abdomen 11/20/2021 and CT 07/12/2017 FINDINGS: Lower chest: Bibasilar scarring/atelectasis. Lower lobe bronchiolectasis. No acute abnormality. Hepatobiliary: Hepatic steatosis. Sludge within the gallbladder. No biliary dilation. No evidence of cholecystitis. Pancreas: Atrophic pancreas. No evidence of pancreatitis or pancreatic ductal dilation. Spleen: Unremarkable. Adrenals/Urinary Tract: Normal adrenal glands. Bilateral cortical renal scarring and nonobstructing tiny coaxial stones. No hydronephrosis. Unremarkable bladder. Stomach/Bowel: Normal caliber large and small bowel. No bowel wall thickening. Small hiatal hernia. Duodenal diverticulum. Vascular/Lymphatic: Aortic atherosclerosis. No enlarged abdominal or pelvic  lymph nodes. Reproductive: Hysterectomy. Other: No free intraperitoneal fluid or air. Musculoskeletal: No acute osseous abnormality. 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 IMPRESSION: 1. No acute findings in the abdomen or pelvis. 2. Tiny nonobstructing bilateral nephrolithiasis. 3. Cholelithiasis without evidence of cholecystitis. 4. Hepatic steatosis. 5. 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 Electronically Signed   By: Placido Sou M.D.   On: 08/19/2022 22:46   DG Chest Port 1 View  Result Date: 08/19/2022 CLINICAL DATA:  Sepsis, UTI, nausea, and vomiting EXAM: PORTABLE CHEST 1 VIEW COMPARISON:  Portable exam 1744 hours  compared to 08/16/2022 FINDINGS: Chronic elevation of RIGHT diaphragm. Normal heart size and pulmonary vascularity. Small hiatal hernia. Mild RIGHT basilar atelectasis. Lungs otherwise clear. No infiltrate, pleural effusion, or pneumothorax. Atherosclerotic calcifications at aortic arch. No acute osseous findings. IMPRESSION: Small hiatal hernia. RIGHT basilar atelectasis. Aortic Atherosclerosis (ICD10-I70.0). Electronically Signed   By: Lavonia Dana M.D.   On: 08/19/2022 18:03   MR BRAIN WO CONTRAST  Result Date: 08/16/2022 CLINICAL DATA:  Speech difficulty, stroke suspected EXAM: MRI HEAD WITHOUT CONTRAST TECHNIQUE: Multiplanar, multiecho pulse sequences of the brain and surrounding structures were obtained without intravenous contrast. COMPARISON:  None Available. FINDINGS: Brain: No restricted diffusion to suggest acute or subacute infarct. No acute hemorrhage, mass, mass effect, or midline shift. No hydrocephalus or extra-axial collection. Normal craniocervical junction. No hemosiderin deposition to suggest remote hemorrhage. Vascular: Normal arterial flow voids. Skull and upper cervical spine: Normal marrow signal. Sinuses/Orbits: Clear paranasal sinuses. No acute finding in the orbits. Status post bilateral lens replacements.  Other: The mastoid air cells are well aerated. IMPRESSION: No acute intracranial process. No evidence of acute or subacute infarct. Electronically Signed   By: Merilyn Baba M.D.   On: 08/16/2022 22:28   DG Chest Portable 1 View  Result Date: 08/16/2022 CLINICAL DATA:  Flu-like symptoms and fever EXAM: PORTABLE CHEST 1 VIEW COMPARISON:  12/11/2021 FINDINGS: Elevated right hemidiaphragm with right basilar atelectasis. Additional left basilar atelectasis. No pleural effusion or pneumothorax. Stable cardiomediastinal silhouette. No acute osseous abnormality. IMPRESSION: No change from 12/11/2021. Elevated right hemidiaphragm with right basilar atelectasis. Electronically Signed   By: Placido Sou M.D.   On: 08/16/2022 20:25   CT ANGIO HEAD NECK W WO CM (CODE STROKE)  Result Date: 08/16/2022 CLINICAL DATA:  Aphasia EXAM: CT ANGIOGRAPHY HEAD AND NECK TECHNIQUE: Multidetector CT imaging of the head and neck was performed using the standard protocol during bolus administration of intravenous contrast. Multiplanar CT image reconstructions and MIPs were obtained to evaluate the vascular anatomy. Carotid stenosis measurements (when applicable) are obtained utilizing NASCET criteria, using the distal internal carotid diameter as the denominator. RADIATION DOSE REDUCTION: This exam was performed according to the departmental dose-optimization program which includes automated exposure control, adjustment of the mA and/or kV according to patient size and/or use of iterative reconstruction technique. CONTRAST:  36mL OMNIPAQUE IOHEXOL 350 MG/ML SOLN COMPARISON:  No prior CTA available, correlation is made with 08/16/2022 CT head and 12/23/2020 MRA head FINDINGS: CT HEAD FINDINGS For noncontrast findings, please see same day CT head. CTA NECK FINDINGS Aortic arch: Standard branching. Imaged portion shows no evidence of aneurysm or dissection. No significant stenosis of the major arch vessel origins. Right carotid system:  No evidence of dissection, occlusion, or hemodynamically significant stenosis (greater than 50%). Left carotid system: No evidence of dissection, occlusion, or hemodynamically significant stenosis (greater than 50%). Vertebral arteries: No evidence of dissection, occlusion, or hemodynamically significant stenosis (greater than 50%). Skeleton: No acute osseous abnormality. Degenerative changes in the cervical spine. Other neck: Hypoenhancing nodules in the thyroid, the largest of which measures up to 7 mm, for which no follow-up is indicated. (Reference: J Am Coll Radiol. 2015 Feb;12(2): 143-50) Upper chest: Negative. Review of the MIP images confirms the above findings CTA HEAD FINDINGS Anterior circulation: Both internal carotid arteries are patent to the termini, without significant stenosis. A1 segments patent. Normal anterior communicating artery. Anterior cerebral arteries are patent to their distal aspects. No M1 stenosis or occlusion. MCA branches perfused and symmetric. Posterior circulation: Vertebral arteries  patent to the vertebrobasilar junction without stenosis. Basilar patent to its distal aspect. Superior cerebellar arteries patent proximally. Patent P1 segments. PCAs perfused to their distal aspects without stenosis. The bilateral posterior communicating arteries are not visualized. Venous sinuses: As permitted by contrast timing, patent. Anatomic variants: None significant. Review of the MIP images confirms the above findings IMPRESSION: 1. No intracranial large vessel occlusion or significant stenosis. 2. No hemodynamically significant stenosis in the neck. These findings were discussed by telephone on 08/16/2022 at 7:20 pm with provider MCNEILL KIRKPATRICK . Electronically Signed   By: Merilyn Baba M.D.   On: 08/16/2022 19:29   CT HEAD CODE STROKE WO CONTRAST  Result Date: 08/16/2022 CLINICAL DATA:  Code stroke.  Neuro deficit, acute, stroke suspected EXAM: CT HEAD WITHOUT CONTRAST TECHNIQUE:  Contiguous axial images were obtained from the base of the skull through the vertex without intravenous contrast. RADIATION DOSE REDUCTION: This exam was performed according to the departmental dose-optimization program which includes automated exposure control, adjustment of the mA and/or kV according to patient size and/or use of iterative reconstruction technique. COMPARISON:  CT head Nov 02, 2021. FINDINGS: Brain: No evidence of acute infarction, hemorrhage, hydrocephalus, extra-axial collection or mass lesion/mass effect. Vascular: No hyperdense vessel. Skull: No acute fracture. Sinuses/Orbits: No acute finding. Other: No mastoid effusion. ASPECTS [facility] Stroke Program Early CT Score) total score (0-10 with 10 being normal): 10. IMPRESSION: 1. No evidence of acute abnormality. 2. ASPECTS is 10. Code stroke imaging results were communicated on 08/16/2022 at 7:13 pm to provider Dr. Leonel Ramsay via secure text paging. Electronically Signed   By: Margaretha Sheffield M.D.   On: 08/16/2022 19:13    Microbiology: Recent Results (from the past 240 hour(s))  Resp panel by RT-PCR (RSV, Flu A&B, Covid) Anterior Nasal Swab     Status: None   Collection Time: 08/16/22  7:29 PM   Specimen: Anterior Nasal Swab  Result Value Ref Range Status   SARS Coronavirus 2 by RT PCR NEGATIVE NEGATIVE Final   Influenza A by PCR NEGATIVE NEGATIVE Final   Influenza B by PCR NEGATIVE NEGATIVE Final    Comment: (NOTE) The Xpert Xpress SARS-CoV-2/FLU/RSV plus assay is intended as an aid in the diagnosis of influenza from Nasopharyngeal swab specimens and should not be used as a sole basis for treatment. Nasal washings and aspirates are unacceptable for Xpert Xpress SARS-CoV-2/FLU/RSV testing.  Fact Sheet for Patients: EntrepreneurPulse.com.au  Fact Sheet for Healthcare Providers: IncredibleEmployment.be  This test is not yet approved or cleared by the [place] FDA and has been  authorized for detection and/or diagnosis of SARS-CoV-2 by FDA under an Emergency Use Authorization (EUA). This EUA will remain in effect (meaning this test can be used) for the duration of the COVID-19 declaration under Section 564(b)(1) of the Act, 21 U.S.C. section 360bbb-3(b)(1), unless the authorization is terminated or revoked.     Resp Syncytial Virus by PCR NEGATIVE NEGATIVE Final    Comment: (NOTE) Fact Sheet for Patients: EntrepreneurPulse.com.au  Fact Sheet for Healthcare Providers: IncredibleEmployment.be  This test is not yet approved or cleared by the [place] FDA and has been authorized for detection and/or diagnosis of SARS-CoV-2 by FDA under an Emergency Use Authorization (EUA). This EUA will remain in effect (meaning this test can be used) for the duration of the COVID-19 declaration under Section 564(b)(1) of the Act, 21 U.S.C. section 360bbb-3(b)(1), unless the authorization is terminated or revoked.  Performed at [facility], [place],  [place] [address]   Blood culture (routine x 2)     Status: None   Collection Time: 08/16/22  8:11 PM   Specimen: BLOOD  Result Value Ref Range Status   Specimen Description BLOOD SITE NOT SPECIFIED  Final   Special Requests   Final    BOTTLES DRAWN AEROBIC AND ANAEROBIC Blood Culture results may not be optimal due to an inadequate volume of blood received in culture bottles   Culture   Final    NO GROWTH 5 DAYS Performed at [facility], [place] [address] [place], [place] [address]    Report Status 08/21/2022 FINAL  Final  Urine Culture     Status: Abnormal   Collection Time: 08/17/22 12:15 AM   Specimen: Urine, Clean Catch  Result Value Ref Range Status   Specimen Description URINE, CLEAN CATCH  Final   Special Requests   Final    NONE Performed at [facility], [place] [address], [place], [place] [address]    Culture MULTIPLE SPECIES PRESENT,  SUGGEST RECOLLECTION (A)  Final   Report Status 08/18/2022 FINAL  Final  MRSA Next Gen by PCR, Nasal     Status: None   Collection Time: 08/17/22 12:41 AM   Specimen: Nasal Mucosa; Nasal Swab  Result Value Ref Range Status   MRSA by PCR Next Gen NOT DETECTED NOT DETECTED Final    Comment: (NOTE) The GeneXpert MRSA Assay (FDA approved for NASAL specimens only), is one component of a comprehensive MRSA colonization surveillance program. It is not intended to diagnose MRSA infection nor to guide or monitor treatment for MRSA infections. Test performance is not FDA approved in patients less than 10 years old. Performed at [facility], [place] [address], [place], [place] [address]   Culture, blood (Routine x 2)     Status: None   Collection Time: 08/19/22  5:30 PM   Specimen: BLOOD  Result Value Ref Range Status   Specimen Description BLOOD RIGHT ANTECUBITAL  Final   Special Requests   Final    BOTTLES DRAWN AEROBIC AND ANAEROBIC Blood Culture results may not be optimal due to an inadequate volume of blood received in culture bottles   Culture   Final    NO GROWTH 5 DAYS Performed at [facility], [address] [place], [place] [address]    Report Status 08/24/2022 FINAL  Final  Culture, blood (Routine x 2)     Status: None   Collection Time: 08/19/22  5:31 PM   Specimen: BLOOD  Result Value Ref Range Status   Specimen Description BLOOD LEFT ANTECUBITAL  Final   Special Requests   Final    BOTTLES DRAWN AEROBIC AND ANAEROBIC Blood Culture results may not be optimal due to an inadequate volume of blood received in culture bottles   Culture   Final    NO GROWTH 5 DAYS Performed at [facility], [address], [place], [place] [address]    Report Status 08/24/2022 FINAL  Final  C Difficile Quick Screen w PCR reflex     Status: None   Collection Time: 08/20/22 11:46 AM   Specimen: Stool  Result Value Ref Range Status   C Diff antigen NEGATIVE NEGATIVE  Final   C Diff toxin NEGATIVE NEGATIVE Final   C Diff interpretation No C. difficile detected.  Final    Comment: Performed at [facility], [address] [place],  [address]  Gastrointestinal Panel by PCR ,  Stool     Status: None   Collection Time: 08/20/22 11:46 AM   Specimen: Stool  Result Value Ref Range Status   Campylobacter species NOT DETECTED NOT DETECTED Final   Plesimonas shigelloides NOT DETECTED NOT DETECTED Final   Salmonella species NOT DETECTED NOT DETECTED Final   Yersinia enterocolitica NOT DETECTED NOT DETECTED Final   Vibrio species NOT DETECTED NOT DETECTED Final   Vibrio cholerae NOT DETECTED NOT DETECTED Final   Enteroaggregative E coli (EAEC) NOT DETECTED NOT DETECTED Final   Enteropathogenic E coli (EPEC) NOT DETECTED NOT DETECTED Final   Enterotoxigenic E coli (ETEC) NOT DETECTED NOT DETECTED Final   Shiga like toxin producing E coli (STEC) NOT DETECTED NOT DETECTED Final   Shigella/Enteroinvasive E coli (EIEC) NOT DETECTED NOT DETECTED Final   Cryptosporidium NOT DETECTED NOT DETECTED Final   Cyclospora cayetanensis NOT DETECTED NOT DETECTED Final   Entamoeba histolytica NOT DETECTED NOT DETECTED Final   Giardia lamblia NOT DETECTED NOT DETECTED Final   Adenovirus F40/41 NOT DETECTED NOT DETECTED Final   Astrovirus NOT DETECTED NOT DETECTED Final   Norovirus GI/GII NOT DETECTED NOT DETECTED Final   Rotavirus A NOT DETECTED NOT DETECTED Final   Sapovirus (I, II, IV, and V) NOT DETECTED NOT DETECTED Final    Comment: Performed at [facility], [address], [place], [place] [address]  Urine Culture (for pregnant, neutropenic or urologic patients or patients with an indwelling urinary catheter)     Status: None   Collection Time: 08/20/22  1:30 PM   Specimen: Urine, Clean Catch  Result Value Ref Range Status   Specimen Description   Final    URINE, CLEAN CATCH Performed at [facility], [address]  [place], [place] [address]    Special Requests   Final    NONE Performed at [facility], [address] [place], [place] [address]    Culture   Final    NO GROWTH Performed at [facility], [address], [place], [place] [address]    Report Status 08/21/2022 FINAL  Final     Labs: CBC: Recent Labs  Lab 08/19/22 1719 08/20/22 0917 08/21/22 0346 08/22/22 0521 08/23/22 0416 08/24/22 0649  WBC 20.4* 13.0* 10.6* 11.0* 9.8 10.2  NEUTROABS 18.8*  --   --   --   --   --   HGB 12.6 10.1* 9.8* 11.0* 9.6* 10.5*  HCT 41.1 32.6* 31.3* 35.7* 30.8* 33.4*  MCV 87.8 87.6 86.7 87.7 87.0 86.8  PLT 372 311 366 511* 490* [id number]*   Basic Metabolic Panel: Recent Labs  Lab 08/20/22 0917 08/21/22 0346 08/22/22 0521 08/23/22 0416 08/24/22 0649  NA 141 145 143 144 143  K 3.1* 3.6 3.6 3.1* 3.8  CL 105 115* 114* 111 114*  CO2 24 23 23 23  18*  GLUCOSE 103* 81 88 86 121*  BUN 8 8 8  7* 7*  CREATININE 0.92 0.85 1.03* 0.89 0.79  CALCIUM 7.8* 7.7* 7.8* 7.6* 8.0*  MG 1.6* 2.5* 2.4 2.1 2.3  PHOS 3.5 3.1 3.1 2.8 2.5   Liver Function Tests: Recent Labs  Lab 08/19/22 1719  AST 52*  ALT 29  ALKPHOS 95  BILITOT 0.6  PROT 7.1  ALBUMIN 2.9*   Recent Labs  Lab 08/20/22 0917 08/21/22 0346  LIPASE 20 20   No results for input(s): "AMMONIA" in the last 168 hours. Cardiac Enzymes: No results for input(s): "CKTOTAL", "CKMB", "CKMBINDEX", "TROPONINI" in the last 168 hours.  BNP (last 3 results) No results for input(s): "BNP" in the last 8760 hours. CBG: Recent Labs  Lab 08/22/22 2120 08/23/22 1115 08/23/22 1758 08/23/22 2124 08/24/22 0749  GLUCAP 116* 131* 138* 85 124*    Time spent: 35 minutes  Signed:  Val Riles  [facility] 08/24/2022 11:12 AM

## 2022-08-25 ENCOUNTER — Encounter: Payer: Self-pay | Admitting: Family Medicine

## 2022-08-25 ENCOUNTER — Ambulatory Visit (INDEPENDENT_AMBULATORY_CARE_PROVIDER_SITE_OTHER): Payer: Medicare Other | Admitting: Family Medicine

## 2022-08-25 VITALS — BP 132/80 | HR 91 | Temp 97.3°F | Ht 60.0 in | Wt 141.0 lb

## 2022-08-25 DIAGNOSIS — E2740 Unspecified adrenocortical insufficiency: Secondary | ICD-10-CM | POA: Diagnosis not present

## 2022-08-25 DIAGNOSIS — L03116 Cellulitis of left lower limb: Secondary | ICD-10-CM

## 2022-08-25 DIAGNOSIS — K861 Other chronic pancreatitis: Secondary | ICD-10-CM

## 2022-08-25 DIAGNOSIS — Z794 Long term (current) use of insulin: Secondary | ICD-10-CM

## 2022-08-25 DIAGNOSIS — E1122 Type 2 diabetes mellitus with diabetic chronic kidney disease: Secondary | ICD-10-CM | POA: Diagnosis not present

## 2022-08-25 DIAGNOSIS — L02416 Cutaneous abscess of left lower limb: Secondary | ICD-10-CM

## 2022-08-25 DIAGNOSIS — N1831 Chronic kidney disease, stage 3a: Secondary | ICD-10-CM

## 2022-08-25 NOTE — Patient Instructions (Signed)
Keep follow up with Endocrine as planned. Have metabolic panel checked at that appt.  Keep up with water and work on increasing po intake. Use ondansetron as needed for nausea.

## 2022-08-25 NOTE — Progress Notes (Signed)
Patient ID: Debra Leach, female    DOB: 1950-09-05, 72 y.o.   MRN: [id number]  This visit was conducted in person.  BP 132/80 (BP Location: Left Arm, Patient Position: Sitting, Cuff Size: Normal)   Pulse 91   Temp (!) 97.3 F (36.3 C) (Temporal)   Ht 5' (1.524 m)   Wt 141 lb (64 kg)   SpO2 93%   BMI 27.54 kg/m    CC:  Chief Complaint  Patient presents with   Hospitalization Follow-up    Subjective:   HPI: Debra Leach is a 72 y.o. female presenting on 08/25/2022 for Hospitalization Follow-up   2 admissions in last month... for sepsis secondary to UTI and cellulitis 3/11/ to 3/13  -patient was admitted to the hospital with high fever as well as confusion, initial concern for CVA.  Neurology consulted, with negative MRI and UA findings this was believed to be sepsis due to UTI.  Patient has been experiencing foul-smelling urine for several days prior to admission.  She was placed on ceftriaxone with excellent response, leukocytosis resolved and she was returned to baseline.  Urine cultures unfortunately showed multiple species, not predominant.  3/14/ to 3/19   Patient presented with nausea, vomiting and diarrhea. C. difficile and GI pathogen negative. S/p Imodium as needed, and s/p Probiotics CT A/P negative for any acute findings.  CXR negative. S/p vancomycin and Flagyl, patient developed allergic reaction, developed lip swelling on 3/16 so discontinued vancomycin and Flagyl.  S/p cefepime for 5 days given during hospital stay.  Patient does not need more antibiotics on discharge. Hold immunosuppressant medications during hospital stay, resumed on discharge    Rash on  left lower leg... resolved back to baseline rash.    No fever. No urinary symptoms.  Feeling achy and tired.  Has appt for follow up with endocrine (3/27) for adrenal insufficiency... now on hydrocortisone.  Having some nausea off and on for several weeks.  Trying to eat more, has some  ondansetron to use prn.   FBS 83 this AM.   Good water intake, nml UOP.  Wt Readings from Last 3 Encounters:  08/25/22 141 lb (64 kg)  08/19/22 147 lb 7.8 oz (66.9 kg)  08/17/22 138 lb (62.6 kg)    She was on azathioprine for 2 weeks prior hospitalization..held when in hospital... she has contacted  Dr. Meridee Score about when to restart.  Was trying to decrease budesonide.    Needs BMP but in 1 week.  Relevant past medical, surgical, family and social history reviewed and updated as indicated. Interim medical history since our last visit reviewed. Allergies and medications reviewed and updated. Outpatient Medications Prior to Visit  Medication Sig Dispense Refill   atorvastatin (LIPITOR) 80 MG tablet Take 1 tablet (80 mg total) by mouth at bedtime.     azaTHIOprine (IMURAN) 50 MG tablet Take 1 tablet (50 mg total) by mouth daily. 30 tablet 6   Blood Glucose Monitoring Suppl (ONETOUCH VERIO REFLECT) w/Device KIT TEST TWICE A DAY 1 kit 0   budesonide (ENTOCORT EC) 3 MG 24 hr capsule Take 2 capsules (6 mg total) by mouth daily. 60 capsule 12   colesevelam (WELCHOL) 625 MG tablet TAKE 3 TABLETS BY MOUTH 2 TIMES DAILY WITH A MEAL. (Patient taking differently: Take 1,875 mg by mouth 2 (two) times daily with a meal.) 540 tablet 3   Continuous Blood Gluc Sensor (DEXCOM G7 SENSOR) MISC 1 Device by Does not apply route as directed. 9  each 3   ELIQUIS 2.5 MG TABS tablet TAKE 1 TABLET BY MOUTH 2 TIMES DAILY. (Patient taking differently: Take 2.5 mg by mouth 2 (two) times daily.) 180 tablet 0   Eptinezumab-jjmr (VYEPTI) 100 MG/ML injection Inject 300 mg into the vein every 3 (three) months.     ferrous gluconate (FERGON) 324 MG tablet Take 1 tablet (324 mg total) by mouth daily with breakfast. 30 tablet 6   gabapentin (NEURONTIN) 100 MG capsule TAKE 1 CAPSULE BY MOUTH UP TO 3 TIMES DAILY AS NEEDED. (Patient taking differently: Take 100 mg by mouth 3 (three) times daily as needed (Neuropathic pain).)  270 capsule 3   gabapentin (NEURONTIN) 300 MG capsule TAKE 2 CAPSULES BY MOUTH AT BEDTIME. (Patient taking differently: Take 600 mg by mouth at bedtime.) 180 capsule 0   hydrocortisone (CORTEF) 10 MG tablet Take 1 tablet (10 mg total) by mouth daily. 90 tablet 3   hydrocortisone (CORTEF) 5 MG tablet Take 1 tablet (5 mg total) by mouth daily after lunch. 90 tablet 3   insulin glargine (LANTUS SOLOSTAR) 100 UNIT/ML Solostar Pen Inject 18 Units into the skin daily.     Lancets (ONETOUCH DELICA PLUS LANCET30G) MISC USE TO CHECK BLOOD SUGAR TWO TIMES DAILY 200 each 3   omeprazole (PRILOSEC) 20 MG capsule Take 20 mg by mouth daily with supper.     ondansetron (ZOFRAN) 4 MG tablet Take 1 tablet (4 mg total) by mouth every 8 (eight) hours as needed for nausea or vomiting. 20 tablet 6   ONETOUCH VERIO test strip USE TO CHECK BLOOD SUGAR TWO TIMES A DAY 200 strip 3   potassium chloride SA (KLOR-CON M) 20 MEQ tablet TAKE 1 TABLET BY MOUTH DAILY. (Patient taking differently: Take 20 mEq by mouth daily.) 90 tablet 1   repaglinide (PRANDIN) 0.5 MG tablet Take 2 tablets (1 mg total) by mouth daily before lunch. before breakfast and lunch (Patient taking differently: Take 1 mg by mouth daily before lunch.) 180 tablet 3   UBRELVY 100 MG TABS TAKE 1 TABLET BY MOUTH AS NEEDED (MAY REPEAT DOSE AFTER 2 HOURS IF NEEDED. MAXIMUM 2 TABLETS IN 24 HOURS). 10 tablet 3   vitamin B-12 (CYANOCOBALAMIN) 1000 MCG tablet Take 1,000 mcg by mouth daily with breakfast.     Vitamin D, Ergocalciferol, (DRISDOL) 1.25 MG (50000 UNIT) CAPS capsule TAKE 1 CAPSULE BY MOUTH ONCE A WEEK. 12 capsule 3   acetaminophen (TYLENOL) 500 MG tablet Take 500-1,000 mg by mouth every 6 (six) hours as needed for moderate pain or fever.     No facility-administered medications prior to visit.     Per HPI unless specifically indicated in ROS section below Review of Systems  Constitutional:  Negative for fatigue and fever.  HENT:  Negative for  congestion.   Eyes:  Negative for pain.  Respiratory:  Negative for cough and shortness of breath.   Cardiovascular:  Negative for chest pain, palpitations and leg swelling.  Gastrointestinal:  Negative for abdominal pain.  Genitourinary:  Negative for dysuria and vaginal bleeding.  Musculoskeletal:  Negative for back pain.  Neurological:  Negative for syncope, Leach-headedness and headaches.  Psychiatric/Behavioral:  Negative for dysphoric mood.    Objective:  BP 132/80 (BP Location: Left Arm, Patient Position: Sitting, Cuff Size: Normal)   Pulse 91   Temp (!) 97.3 F (36.3 C) (Temporal)   Ht 5' (1.524 m)   Wt 141 lb (64 kg)   SpO2 93%   BMI 27.54 kg/m  Wt Readings from Last 3 Encounters:  08/25/22 141 lb (64 kg)  08/19/22 147 lb 7.8 oz (66.9 kg)  08/17/22 138 lb (62.6 kg)      Physical Exam Constitutional:      General: She is not in acute distress.    Appearance: Normal appearance. She is well-developed. She is not ill-appearing or toxic-appearing.  HENT:     Head: Normocephalic.     Right Ear: Hearing, tympanic membrane, ear canal and external ear normal. Tympanic membrane is not erythematous, retracted or bulging.     Left Ear: Hearing, tympanic membrane, ear canal and external ear normal. Tympanic membrane is not erythematous, retracted or bulging.     Nose: No mucosal edema or rhinorrhea.     Right Sinus: No maxillary sinus tenderness or frontal sinus tenderness.     Left Sinus: No maxillary sinus tenderness or frontal sinus tenderness.     Mouth/Throat:     Pharynx: Uvula midline.  Eyes:     General: Lids are normal. Lids are everted, no foreign bodies appreciated.     Conjunctiva/sclera: Conjunctivae normal.     Pupils: Pupils are equal, round, and reactive to Leach.  Neck:     Thyroid: No thyroid mass or thyromegaly.     Vascular: No carotid bruit.     Trachea: Trachea normal.  Cardiovascular:     Rate and Rhythm: Normal rate and regular rhythm.     Pulses:  Normal pulses.     Heart sounds: Normal heart sounds, S1 normal and S2 normal. No murmur heard.    No friction rub. No gallop.  Pulmonary:     Effort: Pulmonary effort is normal. No tachypnea or respiratory distress.     Breath sounds: Normal breath sounds. No decreased breath sounds, wheezing, rhonchi or rales.  Abdominal:     General: Bowel sounds are normal.     Palpations: Abdomen is soft.     Tenderness: There is no abdominal tenderness.  Musculoskeletal:     Cervical back: Normal range of motion and neck supple.  Skin:    General: Skin is warm and dry.     Findings: No rash.  Neurological:     Mental Status: She is alert.  Psychiatric:        Mood and Affect: Mood is not anxious or depressed.        Speech: Speech normal.        Behavior: Behavior normal. Behavior is cooperative.        Thought Content: Thought content normal.        Judgment: Judgment normal.       Results for orders placed or performed during the hospital encounter of 08/19/22  Culture, blood (Routine x 2)   Specimen: BLOOD  Result Value Ref Range   Specimen Description BLOOD RIGHT ANTECUBITAL    Special Requests      BOTTLES DRAWN AEROBIC AND ANAEROBIC Blood Culture results may not be optimal due to an inadequate volume of blood received in culture bottles   Culture      NO GROWTH 5 DAYS Performed at [facility], [address] [place], [place] [address]    Report Status 08/24/2022 FINAL   Culture, blood (Routine x 2)   Specimen: BLOOD  Result Value Ref Range   Specimen Description BLOOD LEFT ANTECUBITAL    Special Requests      BOTTLES DRAWN AEROBIC AND ANAEROBIC Blood Culture results may not be optimal due to an inadequate volume of  blood received in culture bottles   Culture      NO GROWTH 5 DAYS Performed at [facility], [address] [place], [place] [address]    Report Status 08/24/2022 FINAL   Urine Culture (for pregnant, neutropenic or urologic patients or  patients with an indwelling urinary catheter)   Specimen: Urine, Clean Catch  Result Value Ref Range   Specimen Description      URINE, CLEAN CATCH Performed at [facility], [address] [place], [place] [address]    Special Requests      NONE Performed at [facility], [address], [place], [place] [address]    Culture      NO GROWTH Performed at [facility], [address] [place], [place] [address]    Report Status 08/21/2022 FINAL   C Difficile Quick Screen w PCR reflex   Specimen: Stool  Result Value Ref Range   C Diff antigen NEGATIVE NEGATIVE   C Diff toxin NEGATIVE NEGATIVE   C Diff interpretation No C. difficile detected.   Gastrointestinal Panel by PCR , Stool   Specimen: Stool  Result Value Ref Range   Campylobacter species NOT DETECTED NOT DETECTED   Plesimonas shigelloides NOT DETECTED NOT DETECTED   Salmonella species NOT DETECTED NOT DETECTED   Yersinia enterocolitica NOT DETECTED NOT DETECTED   Vibrio species NOT DETECTED NOT DETECTED   Vibrio cholerae NOT DETECTED NOT DETECTED   Enteroaggregative E coli (EAEC) NOT DETECTED NOT DETECTED   Enteropathogenic E coli (EPEC) NOT DETECTED NOT DETECTED   Enterotoxigenic E coli (ETEC) NOT DETECTED NOT DETECTED   Shiga like toxin producing E coli (STEC) NOT DETECTED NOT DETECTED   Shigella/Enteroinvasive E coli (EIEC) NOT DETECTED NOT DETECTED   Cryptosporidium NOT DETECTED NOT DETECTED   Cyclospora cayetanensis NOT DETECTED NOT DETECTED   Entamoeba histolytica NOT DETECTED NOT DETECTED   Giardia lamblia NOT DETECTED NOT DETECTED   Adenovirus F40/41 NOT DETECTED NOT DETECTED   Astrovirus NOT DETECTED NOT DETECTED   Norovirus GI/GII NOT DETECTED NOT DETECTED   Rotavirus A NOT DETECTED NOT DETECTED   Sapovirus (I, II, IV, and V) NOT DETECTED NOT DETECTED  Comprehensive metabolic panel  Result Value Ref Range   Sodium 141 135 - 145 mmol/L   Potassium 3.6 3.5 - 5.1 mmol/L    Chloride 102 98 - 111 mmol/L   CO2 27 22 - 32 mmol/L   Glucose, Bld 115 (H) 70 - 99 mg/dL   BUN 5 (L) 8 - 23 mg/dL   Creatinine, Ser 2.95 (H) 0.44 - 1.00 mg/dL   Calcium 8.5 (L) 8.9 - 10.3 mg/dL   Total Protein 7.1 6.5 - 8.1 g/dL   Albumin 2.9 (L) 3.5 - 5.0 g/dL   AST 52 (H) 15 - 41 U/L   ALT 29 0 - 44 U/L   Alkaline Phosphatase 95 38 - 126 U/L   Total Bilirubin 0.6 0.3 - 1.2 mg/dL   GFR, Estimated 54 (L) >60 mL/min   Anion gap 12 5 - 15  Lactic acid, plasma  Result Value Ref Range   Lactic Acid, Venous 2.4 (HH) 0.5 - 1.9 mmol/L  Lactic acid, plasma  Result Value Ref Range   Lactic Acid, Venous 1.1 0.5 - 1.9 mmol/L  CBC with Differential  Result Value Ref Range   WBC 20.4 (H) 4.0 - 10.5 K/uL   RBC 4.68 3.87 - 5.11 MIL/uL   Hemoglobin 12.6 12.0 - 15.0 g/dL  HCT 41.1 36.0 - 46.0 %   MCV 87.8 80.0 - 100.0 fL   MCH 26.9 26.0 - 34.0 pg   MCHC 30.7 30.0 - 36.0 g/dL   RDW 16.1 (H) 09.6 - 04.5 %   Platelets 372 150 - 400 K/uL   nRBC 0.1 0.0 - 0.2 %   Neutrophils Relative % 92 %   Neutro Abs 18.8 (H) 1.7 - 7.7 K/uL   Lymphocytes Relative 5 %   Lymphs Abs 0.9 0.7 - 4.0 K/uL   Monocytes Relative 2 %   Monocytes Absolute 0.4 0.1 - 1.0 K/uL   Eosinophils Relative 0 %   Eosinophils Absolute 0.1 0.0 - 0.5 K/uL   Basophils Relative 0 %   Basophils Absolute 0.1 0.0 - 0.1 K/uL   Immature Granulocytes 1 %   Abs Immature Granulocytes 0.12 (H) 0.00 - 0.07 K/uL  Protime-INR  Result Value Ref Range   Prothrombin Time 15.8 (H) 11.4 - 15.2 seconds   INR 1.3 (H) 0.8 - 1.2  Urinalysis, Routine w reflex microscopic -Urine, Clean Catch  Result Value Ref Range   Color, Urine STRAW (A) YELLOW   APPearance CLEAR (A) CLEAR   Specific Gravity, Urine 1.004 (L) 1.005 - 1.030   pH 6.0 5.0 - 8.0   Glucose, UA NEGATIVE NEGATIVE mg/dL   Hgb urine dipstick NEGATIVE NEGATIVE   Bilirubin Urine NEGATIVE NEGATIVE   Ketones, ur NEGATIVE NEGATIVE mg/dL   Protein, ur NEGATIVE NEGATIVE mg/dL   Nitrite  NEGATIVE NEGATIVE   Leukocytes,Ua NEGATIVE NEGATIVE  Cortisol-am, blood  Result Value Ref Range   Cortisol - AM 1.5 (L) 6.7 - 22.6 ug/dL  Procalcitonin  Result Value Ref Range   Procalcitonin 2.76 ng/mL  Glucose, capillary  Result Value Ref Range   Glucose-Capillary 73 70 - 99 mg/dL  Glucose, capillary  Result Value Ref Range   Glucose-Capillary 58 (L) 70 - 99 mg/dL  Glucose, capillary  Result Value Ref Range   Glucose-Capillary 179 (H) 70 - 99 mg/dL  Glucose, capillary  Result Value Ref Range   Glucose-Capillary 170 (H) 70 - 99 mg/dL   Comment 1 Notify RN   Glucose, capillary  Result Value Ref Range   Glucose-Capillary 125 (H) 70 - 99 mg/dL  CBC  Result Value Ref Range   WBC 13.0 (H) 4.0 - 10.5 K/uL   RBC 3.72 (L) 3.87 - 5.11 MIL/uL   Hemoglobin 10.1 (L) 12.0 - 15.0 g/dL   HCT 40.9 (L) 81.1 - 91.4 %   MCV 87.6 80.0 - 100.0 fL   MCH 27.2 26.0 - 34.0 pg   MCHC 31.0 30.0 - 36.0 g/dL   RDW 78.2 (H) 95.6 - 21.3 %   Platelets 311 150 - 400 K/uL   nRBC 0.0 0.0 - 0.2 %  Basic metabolic panel  Result Value Ref Range   Sodium 141 135 - 145 mmol/L   Potassium 3.1 (L) 3.5 - 5.1 mmol/L   Chloride 105 98 - 111 mmol/L   CO2 24 22 - 32 mmol/L   Glucose, Bld 103 (H) 70 - 99 mg/dL   BUN 8 8 - 23 mg/dL   Creatinine, Ser 0.86 0.44 - 1.00 mg/dL   Calcium 7.8 (L) 8.9 - 10.3 mg/dL   GFR, Estimated >57 >84 mL/min   Anion gap 12 5 - 15  Magnesium  Result Value Ref Range   Magnesium 1.6 (L) 1.7 - 2.4 mg/dL  Phosphorus  Result Value Ref Range   Phosphorus 3.5 2.5 - 4.6  mg/dL  Lipase, blood  Result Value Ref Range   Lipase 20 11 - 51 U/L  Glucose, capillary  Result Value Ref Range   Glucose-Capillary 72 70 - 99 mg/dL  Magnesium  Result Value Ref Range   Magnesium 2.5 (H) 1.7 - 2.4 mg/dL  Phosphorus  Result Value Ref Range   Phosphorus 3.1 2.5 - 4.6 mg/dL  Basic metabolic panel  Result Value Ref Range   Sodium 145 135 - 145 mmol/L   Potassium 3.6 3.5 - 5.1 mmol/L   Chloride  115 (H) 98 - 111 mmol/L   CO2 23 22 - 32 mmol/L   Glucose, Bld 81 70 - 99 mg/dL   BUN 8 8 - 23 mg/dL   Creatinine, Ser 2.95 0.44 - 1.00 mg/dL   Calcium 7.7 (L) 8.9 - 10.3 mg/dL   GFR, Estimated >62 >13 mL/min   Anion gap 7 5 - 15  CBC  Result Value Ref Range   WBC 10.6 (H) 4.0 - 10.5 K/uL   RBC 3.61 (L) 3.87 - 5.11 MIL/uL   Hemoglobin 9.8 (L) 12.0 - 15.0 g/dL   HCT 08.6 (L) 57.8 - 46.9 %   MCV 86.7 80.0 - 100.0 fL   MCH 27.1 26.0 - 34.0 pg   MCHC 31.3 30.0 - 36.0 g/dL   RDW 62.9 (H) 52.8 - 41.3 %   Platelets 366 150 - 400 K/uL   nRBC 0.0 0.0 - 0.2 %  Lipase, blood  Result Value Ref Range   Lipase 20 11 - 51 U/L  Glucose, capillary  Result Value Ref Range   Glucose-Capillary 90 70 - 99 mg/dL  Glucose, capillary  Result Value Ref Range   Glucose-Capillary 111 (H) 70 - 99 mg/dL  Glucose, capillary  Result Value Ref Range   Glucose-Capillary 125 (H) 70 - 99 mg/dL  Cortisol-pm, blood  Result Value Ref Range   Cortisol - PM 0.7 <10.0 ug/dL  TSH  Result Value Ref Range   TSH 0.456 0.350 - 4.500 uIU/mL  Glucose, capillary  Result Value Ref Range   Glucose-Capillary 71 70 - 99 mg/dL  Cortisol-am, blood  Result Value Ref Range   Cortisol - AM 0.9 (L) 6.7 - 22.6 ug/dL  Basic metabolic panel  Result Value Ref Range   Sodium 143 135 - 145 mmol/L   Potassium 3.6 3.5 - 5.1 mmol/L   Chloride 114 (H) 98 - 111 mmol/L   CO2 23 22 - 32 mmol/L   Glucose, Bld 88 70 - 99 mg/dL   BUN 8 8 - 23 mg/dL   Creatinine, Ser 2.44 (H) 0.44 - 1.00 mg/dL   Calcium 7.8 (L) 8.9 - 10.3 mg/dL   GFR, Estimated 58 (L) >60 mL/min   Anion gap 6 5 - 15  CBC  Result Value Ref Range   WBC 11.0 (H) 4.0 - 10.5 K/uL   RBC 4.07 3.87 - 5.11 MIL/uL   Hemoglobin 11.0 (L) 12.0 - 15.0 g/dL   HCT 01.0 (L) 27.2 - 53.6 %   MCV 87.7 80.0 - 100.0 fL   MCH 27.0 26.0 - 34.0 pg   MCHC 30.8 30.0 - 36.0 g/dL   RDW 64.4 (H) 03.4 - 74.2 %   Platelets 511 (H) 150 - 400 K/uL   nRBC 0.2 0.0 - 0.2 %  Magnesium  Result  Value Ref Range   Magnesium 2.4 1.7 - 2.4 mg/dL  Phosphorus  Result Value Ref Range   Phosphorus 3.1 2.5 - 4.6 mg/dL  Glucose,  capillary  Result Value Ref Range   Glucose-Capillary 95 70 - 99 mg/dL  Glucose, capillary  Result Value Ref Range   Glucose-Capillary 107 (H) 70 - 99 mg/dL  Glucose, capillary  Result Value Ref Range   Glucose-Capillary 93 70 - 99 mg/dL  Glucose, capillary  Result Value Ref Range   Glucose-Capillary 127 (H) 70 - 99 mg/dL  Cortisol  Result Value Ref Range   Cortisol, Plasma 7.4 ug/dL  Basic metabolic panel  Result Value Ref Range   Sodium 144 135 - 145 mmol/L   Potassium 3.1 (L) 3.5 - 5.1 mmol/L   Chloride 111 98 - 111 mmol/L   CO2 23 22 - 32 mmol/L   Glucose, Bld 86 70 - 99 mg/dL   BUN 7 (L) 8 - 23 mg/dL   Creatinine, Ser 1.61 0.44 - 1.00 mg/dL   Calcium 7.6 (L) 8.9 - 10.3 mg/dL   GFR, Estimated >09 >60 mL/min   Anion gap 10 5 - 15  CBC  Result Value Ref Range   WBC 9.8 4.0 - 10.5 K/uL   RBC 3.54 (L) 3.87 - 5.11 MIL/uL   Hemoglobin 9.6 (L) 12.0 - 15.0 g/dL   HCT 45.4 (L) 09.8 - 11.9 %   MCV 87.0 80.0 - 100.0 fL   MCH 27.1 26.0 - 34.0 pg   MCHC 31.2 30.0 - 36.0 g/dL   RDW 14.7 (H) 82.9 - 56.2 %   Platelets 490 (H) 150 - 400 K/uL   nRBC 0.5 (H) 0.0 - 0.2 %  Magnesium  Result Value Ref Range   Magnesium 2.1 1.7 - 2.4 mg/dL  Phosphorus  Result Value Ref Range   Phosphorus 2.8 2.5 - 4.6 mg/dL  Glucose, capillary  Result Value Ref Range   Glucose-Capillary 166 (H) 70 - 99 mg/dL  Glucose, capillary  Result Value Ref Range   Glucose-Capillary 116 (H) 70 - 99 mg/dL  ACTH  Result Value Ref Range   C206 ACTH 6.0 (L) 7.2 - 63.3 pg/mL  DHEA-sulfate  Result Value Ref Range   DHEA-SO4 1.1 (L) 20.4 - 186.6 ug/dL  Glucose, capillary  Result Value Ref Range   Glucose-Capillary 131 (H) 70 - 99 mg/dL  Basic metabolic panel  Result Value Ref Range   Sodium 143 135 - 145 mmol/L   Potassium 3.8 3.5 - 5.1 mmol/L   Chloride 114 (H) 98 - 111 mmol/L    CO2 18 (L) 22 - 32 mmol/L   Glucose, Bld 121 (H) 70 - 99 mg/dL   BUN 7 (L) 8 - 23 mg/dL   Creatinine, Ser 1.30 0.44 - 1.00 mg/dL   Calcium 8.0 (L) 8.9 - 10.3 mg/dL   GFR, Estimated >86 >57 mL/min   Anion gap 11 5 - 15  CBC  Result Value Ref Range   WBC 10.2 4.0 - 10.5 K/uL   RBC 3.85 (L) 3.87 - 5.11 MIL/uL   Hemoglobin 10.5 (L) 12.0 - 15.0 g/dL   HCT 84.6 (L) 96.2 - 95.2 %   MCV 86.8 80.0 - 100.0 fL   MCH 27.3 26.0 - 34.0 pg   MCHC 31.4 30.0 - 36.0 g/dL   RDW 84.1 (H) 32.4 - 40.1 %   Platelets 513 (H) 150 - 400 K/uL   nRBC 0.9 (H) 0.0 - 0.2 %  Magnesium  Result Value Ref Range   Magnesium 2.3 1.7 - 2.4 mg/dL  Phosphorus  Result Value Ref Range   Phosphorus 2.5 2.5 - 4.6 mg/dL  Glucose, capillary  Result Value Ref Range   Glucose-Capillary 138 (H) 70 - 99 mg/dL  Glucose, capillary  Result Value Ref Range   Glucose-Capillary 85 70 - 99 mg/dL  Glucose, capillary  Result Value Ref Range   Glucose-Capillary 124 (H) 70 - 99 mg/dL  Glucose, capillary  Result Value Ref Range   Glucose-Capillary 132 (H) 70 - 99 mg/dL    Assessment and Plan  Cellulitis and abscess of left leg Assessment & Plan: Acute, improved status post antibiotics.  Chronic rash now back at baseline.    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 Meridee Score. Chronic budesonide. She will contact GI about when to  re-start azathioprine.   Adrenal insufficiency (HCC) Assessment & Plan: Diagnosed with chronic adrenal insufficiency.  Has follow-up with endocrine on March 27.  She is now on hydrocortisone.  Needs plan on how to adjust steroids in the setting of future infections.   Type 2 diabetes mellitus with stage 3a chronic kidney disease, with long-term current use of insulin (HCC) Assessment & Plan: Chronic, tolerable control. Continue to follow closely.  Continue current medications     No follow-ups on file.   Kerby Nora, MD

## 2022-08-26 DIAGNOSIS — E2740 Unspecified adrenocortical insufficiency: Secondary | ICD-10-CM | POA: Insufficient documentation

## 2022-08-26 NOTE — Assessment & Plan Note (Signed)
Chronic, tolerable control. Continue to follow closely.  Continue current medications

## 2022-08-26 NOTE — Assessment & Plan Note (Addendum)
Chronic Followed by Dr. Rush Landmark. Chronic budesonide. She will contact GI about when to  re-start azathioprine.

## 2022-08-26 NOTE — Assessment & Plan Note (Addendum)
Acute, improved status post antibiotics.  Chronic rash now back at baseline.

## 2022-08-26 NOTE — Assessment & Plan Note (Signed)
Diagnosed with chronic adrenal insufficiency.  Has follow-up with endocrine on March 27.  She is now on hydrocortisone.  Needs plan on how to adjust steroids in the setting of future infections.

## 2022-08-30 NOTE — Progress Notes (Unsigned)
In   NEUROLOGY FOLLOW UP OFFICE NOTE  Bryseida Somogyi [id number]  Assessment/Plan:   Worsening migraine with aura in setting of sepsis Chronic migraine without aura, without status migrainosus, not intractable - overall improved Ocular migraine Elevated blood pressure - follow up with PCP   Migraine prevention:  Vyepti 300mg  Q12wks.   gabapentin 600mg  at bedtime Migraine rescue:  Ubrelvy 100mg ; gabapentin 100mg  for less severe Limit use of pain relievers to no more than 2 days out of week to prevent risk of rebound or medication-overuse headache. Keep headache diary Follow up 6 months.     Subjective:  Debra Leach is a 72 year old right-handed female with autoimmune pancreatitis, migraines, hyperlipidemia, diabetes, GERD, osteoarthritis, and history of MVP and renal stones who follows up for migraines as well as new concern, dizziness.  ENT note reviewed.   UPDATE: She was admitted to [facility] for recurrent and worsening episodes of speech difficulty with headache.  She also had intermittent fevers.  She was found to have sepsis secondary to UTI and was started on ceftriaxone.  MRI of brain without contrast personally reviewed was unremarkable.     Migraines have been improved overall. From March to July: 27 headache-free days (24% of days) 16 days 2/10 (14% of days) 33 days 3/10 (29% of days) 26 days 4/10 (23% of days) 6 days 5/10 (0.05% of days) 6 days 6/10 (0.05% of days) 0 days 7-10/10 (0% of days)    Rescue protocol:  For moderate - gabapentin 100mg , for severeRoselyn Meier Current NSAIDS/steroid: budesomide Current analgesics: None Current triptans: None Current ergotamine: None Current anti-emetic: Promethazine 12.5 mg Current muscle relaxants: None Current anti-anxiolytic: None Current sleep aide: None Current Antihypertensive medications: losartan-HCTZ Current Antidepressant medications: None Current Anticonvulsant medications: Gabapentin  600mg  at bedtime Current anti-CGRP: Vyepti 300mg ; Ubrelvy Current Vitamins/Herbal/Supplements: Riboflavin 400 mg daily, vitamin D Current Antihistamines/Decongestants: meclizine Other therapy: Cefaly, ice pack Other medication: Meclizine   Caffeine: 1 cup of coffee daily Alcohol: None Smoker: No Diet: Follows LEAP ImmunoCalm Dietary Program.  Hydrates. Exercise: When tolerated (knee problems).  She walks and bikes. Depression: No; Anxiety: No Other pain:  no Sleep hygiene: Improved.  Sleeps better now than every before. 100mg  during day as needed   HISTORY: Migraines: Onset: 72 years old Location:  Varies (unilateral either side, frontal-temporal, back of head) Quality:  Varies (stabbing, pounding, throbbing) Initial Intensity:  10/10 severe, otherwise 5-7/10 Aura:  Occurs off and on over the years and varies in semiology.  In early 1970s, black out.  In late 1980s, white out.  In late 1990s, flashing lights and zigzag lines.  Since 2016, scintillating scotoma (occurs 1 to 2 times a month) Prodrome:  no Associated symptoms: Initially vomiting.  Sometimes nausea.  Photophobia, phonophobia.  Dizziness, speech difficulty. Initial Duration:  All day Initial Frequency:  daily Triggers: Change in weather, emotional stress, valsalva maneuver, odors, certain foods Relieving factors: ice, rest Activity:  Difficult to function if severe   Past NSAIDS:  diclofenac 50mg , Cambia, Mobic 15mg , ibuprofen, naproxen, toradol Past analgesics:  tramadol (reaction), Midrin, Excedrin Past abortive triptans/ergots:  Treximet, Axert, Amerge, Frova, Maxalt, Relpax, Zomig tablet, DHE NS, sumatriptan 100mg ; sumatriptan 6mg  [place] Past anti-emetic:  Zofran 4mg  Past anxiolytic:  Buspirone, clonazepam Past antihypertensive medications:  Metoprolol, Norvasc, propranolol ER 120mg , atenolol 100mg  (side effects dizziness, myalgias, acid reflux) Past antidepressant medications:  Venlafaxine, sertraline,  amitriptyline, amoxapine, duloxetine, nortriptyline, imipramine, Luvox, Remeron, Serzone, bupropion, desipramine, doxepin, Vivactil Past anticonvulsant medications:  Depakote, Topamax,  zonisamide, Lamictal, Keppra, Lyrica, gabapentin 300/300/600  Past anti--CGRP: Aimovig, Emgality Past vitamins/Herbal/Supplements:  butterbur, Mg, CoQ10 Other past treatments:  Botox, Cefaly Device, methylergonovine, acupuncture, biofeedback, Seroquel, Thorazine, methylsergide maleate   Family history of headache:  Maybe her mother's side.   Prior brain MRI normal (date unknown but report is mentioned in prior notes).   DIzziness: Reports dizziness since the beginning of the year.  No preceding head injury or new medication.  Reports episodic dizziness described as a wave of lightheadedness or sensation of stepping off of a boat, but not spinning.  It occurs with any movement but also can occur spontaneously.  Typically lasts 15 seconds but occurs all day long.  It doesn't occur while laying in bed, such as turning over.  No headache but feels like her head is floating.  No visual disturbance.  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  It does not appear to correlate with a migraine flare.  She reports history of mild orthostatic hypotension when getting up too fast, but not experiencing that with this and reportedly orthostatic vitals were negative when checked by her PCP.  Flonase didn't help, however meclizine is effective.  She followed up with ENT.  Dix-Hallpike was negative.  VNG showed abnormal smooth pursuit, saccades and optokinetic testing - central pathology cannot be ruled out.  She has bilateral sensorineural hearing loss.  Hearing aids were recommended.  Underwent workup for possible central dizziness.  MRI and MRA of brain on 12/23/2020 were unremarkable.  Vestibular rehab effective.   Abnormal Movements: In  December 2017, we started atenolol for migraine prevention.  It worked well, but due to reported side effects of dizziness, acid reflux and joint and muscle pain, she was tapered off of it in early April.  Around that time, she developed shaking episodes or tremors associated with her migraines.  She followed up with her PCP, Dr. Diona Browner, on 10/08/16 and we had her discontinue the gabapentin.  She hasn't had any recurrent tremors, however she says she has gone up to 4 days at a time without an attack.  Since discontinuing the gabapentin, the intensity of her daily headaches have gotten worse.     I reviewed the video of her habitual attacks.  In the video, she exhibits flapping of her right hand, arrhythmic and without tremor or choreiform movement.  It is not an arrhythmic jerking, consistent with myoclonus.  It sometimes involves the left hand as well.  She denies stress and anxiety.  Episodic Right Monocular Vision Loss: In April 2023, she began having episodes of monocular dimming of vision in the right eye lasting 15 minutes.  No associated headache.  Different than her previous ocular migraines presenting as pixel vision.  Saw her ophthalmologist and eye exam was normal.  Sed rate on 5/12 was 102 and repeat on 5/17 was 130.  She was hospitalized on 11/02/2021 for syncope in setting of AKI and flu.  She was placed on 5 day course of prednsione.  Sed rate on day of admission was 60.  Sed rate in May 2023 was over 130.  Carotid ultrasound on 6/15 was negative for hemodynamically significant stenosis.  Underwent temporal artery biopsy on 6/29 which was negative.  Repeat eye exam on 12/09/2021 was normal.  Possible ocular migraine?  PAST MEDICAL HISTORY: Past Medical History:  Diagnosis Date   Anemia    Arthritis    osteoarthritis B  knees, left hip , right elbow   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HCC)    Clotting disorder (HCC)    Complication of anesthesia    difficult waking   Diabetes mellitus without  complication (HCC)    Diet and exercise controlled   Diverticulosis    DVT (deep venous thrombosis) ([place]) 02/2019   3calf 2 right lung 1 left lung   DVT (deep venous thrombosis) ([place]) 2020   right leg   Dyspnea    GERD (gastroesophageal reflux disease)    Headache    Hepatic steatosis    Hiatal hernia    History of kidney stones    Hyperlipidemia    Hypertension    06/29/22 has not had to take blood pressure meds in over 1 year.   MVP (mitral valve prolapse)    history of   PONV (postoperative nausea and vomiting)    Pulmonary emboli ([place]) 03/01/2019   2 in the right; 1 in the left    MEDICATIONS: Current Outpatient Medications on File Prior to Visit  Medication Sig Dispense Refill   atorvastatin (LIPITOR) 80 MG tablet Take 1 tablet (80 mg total) by mouth at bedtime.     azaTHIOprine (IMURAN) 50 MG tablet Take 1 tablet (50 mg total) by mouth daily. 30 tablet 6   Blood Glucose Monitoring Suppl (ONETOUCH VERIO REFLECT) w/Device KIT TEST TWICE A DAY 1 kit 0   budesonide (ENTOCORT EC) 3 MG 24 hr capsule Take 2 capsules (6 mg total) by mouth daily. 60 capsule 12   colesevelam (WELCHOL) 625 MG tablet TAKE 3 TABLETS BY MOUTH 2 TIMES DAILY WITH A MEAL. (Patient taking differently: Take 1,875 mg by mouth 2 (two) times daily with a meal.) 540 tablet 3   Continuous Blood Gluc Sensor (DEXCOM G7 SENSOR) MISC 1 Device by Does not apply route as directed. 9 each 3   ELIQUIS 2.5 MG TABS tablet TAKE 1 TABLET BY MOUTH 2 TIMES DAILY. (Patient taking differently: Take 2.5 mg by mouth 2 (two) times daily.) 180 tablet 0   Eptinezumab-jjmr (VYEPTI) 100 MG/ML injection Inject 300 mg into the vein every 3 (three) months.     ferrous gluconate (FERGON) 324 MG tablet Take 1 tablet (324 mg total) by mouth daily with breakfast. 30 tablet 6   gabapentin (NEURONTIN) 100 MG capsule TAKE 1 CAPSULE BY MOUTH UP TO 3 TIMES DAILY AS NEEDED. (Patient taking differently: Take 100 mg by mouth 3 (three) times daily as  needed (Neuropathic pain).) 270 capsule 3   gabapentin (NEURONTIN) 300 MG capsule TAKE 2 CAPSULES BY MOUTH AT BEDTIME. (Patient taking differently: Take 600 mg by mouth at bedtime.) 180 capsule 0   hydrocortisone (CORTEF) 10 MG tablet Take 1 tablet (10 mg total) by mouth daily. 90 tablet 3   hydrocortisone (CORTEF) 5 MG tablet Take 1 tablet (5 mg total) by mouth daily after lunch. 90 tablet 3   insulin glargine (LANTUS SOLOSTAR) 100 UNIT/ML Solostar Pen Inject 18 Units into the skin daily.     Lancets (ONETOUCH DELICA PLUS Q000111Q) MISC USE TO CHECK BLOOD SUGAR TWO TIMES DAILY 200 each 3   omeprazole (PRILOSEC) 20 MG capsule Take 20 mg by mouth daily with supper.     ondansetron (ZOFRAN) 4 MG tablet Take 1 tablet (4 mg total) by mouth every 8 (eight) hours as needed for nausea or vomiting. 20 tablet 6   ONETOUCH VERIO test strip USE TO CHECK BLOOD SUGAR TWO TIMES A DAY 200 strip 3   potassium  chloride SA (KLOR-CON M) 20 MEQ tablet TAKE 1 TABLET BY MOUTH DAILY. (Patient taking differently: Take 20 mEq by mouth daily.) 90 tablet 1   repaglinide (PRANDIN) 0.5 MG tablet Take 2 tablets (1 mg total) by mouth daily before lunch. before breakfast and lunch (Patient taking differently: Take 1 mg by mouth daily before lunch.) 180 tablet 3   UBRELVY 100 MG TABS TAKE 1 TABLET BY MOUTH AS NEEDED (MAY REPEAT DOSE AFTER 2 HOURS IF NEEDED. MAXIMUM 2 TABLETS IN 24 HOURS). 10 tablet 3   vitamin B-12 (CYANOCOBALAMIN) 1000 MCG tablet Take 1,000 mcg by mouth daily with breakfast.     Vitamin D, Ergocalciferol, (DRISDOL) 1.25 MG (50000 UNIT) CAPS capsule TAKE 1 CAPSULE BY MOUTH ONCE A WEEK. 12 capsule 3   No current facility-administered medications on file prior to visit.    ALLERGIES: Allergies  Allergen Reactions   Sudafed [Pseudoephedrine Hcl] Hives, Itching and Anxiety   Atenolol Other (See Comments)    Migraine   Codeine Itching and Swelling    REACTION: Migraine   Epinephrine Hives   Hydrocodone Other  (See Comments)    Migraine   Ibuprofen Other (See Comments)    Migraine   Jardiance [Empagliflozin] Other (See Comments)    Caused uti Yeast infection   Metronidazole Swelling   Oxycodone Other (See Comments)    Migraine   Penicillins Itching and Swelling    ** Tolerates cephalosporins Has patient had a PCN reaction causing immediate rash, facial/tongue/throat swelling, SOB or lightheadedness with hypotension: No Has patient had a PCN reaction causing severe rash involving mucus membranes or skin necrosis: No Has patient had a PCN reaction that required hospitalization: No Has patient had a PCN reaction occurring within the last 10 years: No     Pentazocine Lactate     REACTION: Swelling, itching, rash   Promethazine Other (See Comments)    migraine   Propranolol Other (See Comments)    Migraine   Talwin [Pentazocine] Other (See Comments)    Swelling and itching    Tramadol Other (See Comments)    Migraine   Vancomycin Swelling   Crestor [Rosuvastatin] Other (See Comments)    Muscle pain    FAMILY HISTORY: Family History  Problem Relation Age of Onset   Cancer Mother        bone   Hypertension Father    Mitral valve prolapse Father    Asthma Brother    Arthritis Brother    Nephrolithiasis Brother    Asthma Brother    COPD Brother    Breast cancer Maternal Aunt    Breast cancer Maternal Aunt    Colon cancer Neg Hx    Esophageal cancer Neg Hx    Inflammatory bowel disease Neg Hx    Liver disease Neg Hx    Pancreatic cancer Neg Hx    Rectal cancer Neg Hx    Stomach cancer Neg Hx       Objective:  *** General: No acute distress.  Patient appears well-groomed.   Head:  Normocephalic/atraumatic Eyes:  Fundi examined but not visualized Neck: supple, no paraspinal tenderness, full range of motion Heart:  Regular rate and rhythm Neurological Exam: ***   Metta Clines, DO  CC: Eliezer Lofts, MD

## 2022-08-31 ENCOUNTER — Ambulatory Visit (INDEPENDENT_AMBULATORY_CARE_PROVIDER_SITE_OTHER): Payer: Medicare Other | Admitting: Neurology

## 2022-08-31 ENCOUNTER — Encounter: Payer: Self-pay | Admitting: Neurology

## 2022-08-31 VITALS — BP 175/80 | HR 93 | Resp 18 | Ht 61.0 in | Wt 138.0 lb

## 2022-08-31 DIAGNOSIS — G43709 Chronic migraine without aura, not intractable, without status migrainosus: Secondary | ICD-10-CM

## 2022-08-31 DIAGNOSIS — R030 Elevated blood-pressure reading, without diagnosis of hypertension: Secondary | ICD-10-CM | POA: Diagnosis not present

## 2022-08-31 NOTE — Patient Instructions (Signed)
Vyepti 300mg  every 3 months and gabapentin 600mg  at bedtime Ubrelvy and gabapentin 100mg  as needed Follow up with PCP regarding elevated blood pressure Follow up 6 months.

## 2022-09-01 ENCOUNTER — Encounter: Payer: Self-pay | Admitting: Internal Medicine

## 2022-09-01 ENCOUNTER — Ambulatory Visit (INDEPENDENT_AMBULATORY_CARE_PROVIDER_SITE_OTHER): Payer: Medicare Other | Admitting: Internal Medicine

## 2022-09-01 VITALS — BP 124/72 | HR 70 | Ht 61.0 in | Wt 137.8 lb

## 2022-09-01 DIAGNOSIS — E1165 Type 2 diabetes mellitus with hyperglycemia: Secondary | ICD-10-CM

## 2022-09-01 DIAGNOSIS — N1831 Chronic kidney disease, stage 3a: Secondary | ICD-10-CM

## 2022-09-01 DIAGNOSIS — Z794 Long term (current) use of insulin: Secondary | ICD-10-CM | POA: Diagnosis not present

## 2022-09-01 DIAGNOSIS — Z7952 Long term (current) use of systemic steroids: Secondary | ICD-10-CM

## 2022-09-01 DIAGNOSIS — E1122 Type 2 diabetes mellitus with diabetic chronic kidney disease: Secondary | ICD-10-CM

## 2022-09-01 LAB — POCT GLUCOSE (DEVICE FOR HOME USE): POC Glucose: 201 mg/dl — AB (ref 70–99)

## 2022-09-01 NOTE — Progress Notes (Unsigned)
Name: Debra Leach  MRN/ DOB: [id number], Jun 18, 1950   Age/ Sex: 72 y.o., female    PCP: [name], MD   Reason for Endocrinology Evaluation: Type 2 Diabetes Mellitus     Date of Initial Endocrinology Visit: 07/10/2021    PATIENT IDENTIFIER: Debra Leach is a 72 y.o. female with a past medical history of DM, renal stones, Autoimmune pancreatitis . The patient presented for initial endocrinology clinic visit on 07/10/2021 for consultative assistance with her diabetes management.    HPI: Debra Leach was    Diagnosed with DM 2012 Prior Medications tried/Intolerance: Metformin- GI side effects .  Currently checking blood sugars 2 x / day Hemoglobin A1c has ranged from 6.7% in 2019, peaking at 9.5% in 2022.    Has limited diet due to migraine headaches she has to eat every 3 hours otherwise her headaches will worsen  She is on long-term glucocorticoids for autoimmune pancreatitis for a long term  started  on 08/2017   On her initial visit to our clinic she had an A1c of 9.5%, she is intolerant to Jardiance due to recurrent UTIs.  We started her on repaglinide  She was also diagnosed with MNG a few years ago with last ultrasound 04/2020, thyroid nodules did not meet criteria for further follow-up.  ADRENAL HISTORY: The patient has been on budesonide for many years due to autoimmune pancreatitis.  She presented to the ED in March, 2024 with sepsis secondary to UTI and cellulitis and was noted to have a low serum cortisol which prompted cosyntropin stimulation test, and she was discharged on hydrocortisone  Her cosyntropin stimulation test  result is INVALID , due to chronic corticosteroid intake.  Patient does not need any additional glucocorticoids as she is already on budesonide   SUBJECTIVE:   During the last visit (02/18/2022): A1c 8.0%     Today (09/01/22): Debra Leach is here for follow-up on diabetes management.  She had a recent ED visit for  hypoglycemia.   She checks her blood sugars multiple times daily, through freestyle libre . The patient has had hypoglycemic episodes since the last clinic visit.   She continues to follow-up with GI for autoimmune pancreatitis She has chronic nausea  She presented to the ED for sepsis secondary to UTI, as well as left lower extremity cellulitis, during that evaluation she had a.m. cortisol of 1.5.  Cosyntropin stimulation test was low at 7.4 with low DHEA-S.  She was started on hydrocortisone 3/18, Of note, the pt is on Budesonide for 5 years    Pt had vomiting today and yesterday that she attributes to hydrocortisone intake , denies diarrhea  and no abdominal pain   She is wearing the dexcom   HOME ENDOCRINE  REGIMEN: Lantus 18 units daily  Repaglinide 0.5 mg, 2 tabs before lunch Hydrocortisone 10 mg every morning, 5 mg in the afternoon    Statin: INtolerant to crestor  ACE-I/ARB: yes Prior Diabetic Education: no    GLUCOSE LOG: n/a        DIABETIC COMPLICATIONS: Microvascular complications:  CKDIII Denies: Neuropathy, retinopathy Last eye exam: Completed   Macrovascular complications:   Denies: CAD, PVD, CVA   PAST HISTORY: Past Medical History:  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[place])    Clotting disorder ([place])    Complication of anesthesia    difficult waking  Diabetes mellitus without complication ([place])    Diet and exercise controlled   Diverticulosis    DVT (deep venous thrombosis) ([place]) 02/2019   3calf 2 right lung 1 left lung   DVT (deep venous thrombosis) ([place]) 2020   right leg   Dyspnea    GERD (gastroesophageal reflux disease)    Headache    Hepatic steatosis    Hiatal hernia    History of kidney stones    Hyperlipidemia    Hypertension    06/29/22 has not had to take blood pressure meds in over 1 year.   MVP (mitral valve prolapse)     history of   PONV (postoperative nausea and vomiting)    Pulmonary emboli ([place]) 03/01/2019   2 in the right; 1 in the left   Past Surgical History:  Past Surgical History:  Procedure Laterality Date   arm surgery  2012   left forearm fracture w/ plates pins screws   ARTERY BIOPSY Right 12/03/2021   Procedure: BIOPSY TEMPORAL ARTERY;  Surgeon: Algernon Huxley, MD;  Location: [facility];  Service: Vascular;  Laterality: Right;  Right temple   BREAST BIOPSY Left    BREAST SURGERY  2000   breast biopsy (benign)   CATARACT EXTRACTION Right 2005   CATARACT EXTRACTION Left 2010   LITHOTRIPSY  08/12/2008   stent placed bilaterally   PANCREAS BIOPSY  2021   PANCREAS BIOPSY  2019   PARTIAL HYSTERECTOMY     Both ovaries remain, vaginal, for mennorhagia   REPAIR EXTENSOR TENDON Right 06/30/2022   Procedure: Middle and ring finger sagittal band reconstruction;  Surgeon: Sherilyn Cooter, MD;  Location: [facility];  Service: Orthopedics;  Laterality: Right;   TONSILLECTOMY     TUBAL LIGATION  1980    Social History:  reports that she quit smoking about 31 years ago. Her smoking use included cigarettes. She has a 13.00 pack-year smoking history. She has never been exposed to tobacco smoke. She has never used smokeless tobacco. She reports that she does not drink alcohol and does not use drugs. Family History:  Family History  Problem Relation Age of Onset   Cancer Mother        bone   Hypertension Father    Mitral valve prolapse Father    Asthma Brother    Arthritis Brother    Nephrolithiasis Brother    Asthma Brother    COPD Brother    Breast cancer Maternal Aunt    Breast cancer Maternal Aunt    Colon cancer Neg Hx    Esophageal cancer Neg Hx    Inflammatory bowel disease Neg Hx    Liver disease Neg Hx    Pancreatic cancer Neg Hx    Rectal cancer Neg Hx    Stomach cancer Neg Hx      HOME MEDICATIONS: Allergies as of 09/01/2022       Reactions   Sudafed [pseudoephedrine Hcl] Hives,  Itching, Anxiety   Atenolol Other (See Comments)   Migraine   Codeine Itching, Swelling   REACTION: Migraine   Epinephrine Hives   Hydrocodone Other (See Comments)   Migraine   Ibuprofen Other (See Comments)   Migraine   Jardiance [empagliflozin] Other (See Comments)   Caused uti Yeast infection   Metronidazole Swelling   Oxycodone Other (See Comments)   Migraine   Penicillins Itching, Swelling   ** Tolerates cephalosporins Has patient had a PCN reaction causing immediate rash, facial/tongue/throat swelling, SOB or lightheadedness with hypotension: No  Has patient had a PCN reaction causing severe rash involving mucus membranes or skin necrosis: No Has patient had a PCN reaction that required hospitalization: No Has patient had a PCN reaction occurring within the last 10 years: No   Pentazocine Lactate    REACTION: Swelling, itching, rash   Promethazine Other (See Comments)   migraine   Propranolol Other (See Comments)   Migraine   Talwin [pentazocine] Other (See Comments)   Swelling and itching    Tramadol Other (See Comments)   Migraine   Vancomycin Swelling   Crestor [rosuvastatin] Other (See Comments)   Muscle pain        Medication List        Accurate as of September 01, 2022 10:17 AM. If you have any questions, ask your nurse or doctor.          STOP taking these medications    hydrocortisone 10 MG tablet Commonly known as: CORTEF Stopped by: Dorita Sciara, MD   hydrocortisone 5 MG tablet Commonly known as: CORTEF Stopped by: Dorita Sciara, MD       TAKE these medications    atorvastatin 80 MG tablet Commonly known as: LIPITOR Take 1 tablet (80 mg total) by mouth at bedtime.   azaTHIOprine 50 MG tablet Commonly known as: IMURAN Take 1 tablet (50 mg total) by mouth daily.   budesonide 3 MG 24 hr capsule Commonly known as: ENTOCORT EC Take 2 capsules (6 mg total) by mouth daily.   colesevelam 625 MG tablet Commonly known as:  WELCHOL TAKE 3 TABLETS BY MOUTH 2 TIMES DAILY WITH A MEAL. What changed: See the new instructions.   cyanocobalamin 1000 MCG tablet Commonly known as: VITAMIN B12 Take 1,000 mcg by mouth daily with breakfast.   Dexcom G7 Sensor Misc 1 Device by Does not apply route as directed.   Eliquis 2.5 MG Tabs tablet Generic drug: apixaban TAKE 1 TABLET BY MOUTH 2 TIMES DAILY. What changed: how much to take   ferrous gluconate 324 MG tablet Commonly known as: FERGON Take 1 tablet (324 mg total) by mouth daily with breakfast.   gabapentin 100 MG capsule Commonly known as: NEURONTIN TAKE 1 CAPSULE BY MOUTH UP TO 3 TIMES DAILY AS NEEDED. What changed: See the new instructions.   gabapentin 300 MG capsule Commonly known as: NEURONTIN TAKE 2 CAPSULES BY MOUTH AT BEDTIME. What changed: Another medication with the same name was changed. Make sure you understand how and when to take each.   Lantus SoloStar 100 UNIT/ML Solostar Pen Generic drug: insulin glargine Inject 18 Units into the skin daily.   omeprazole 20 MG capsule Commonly known as: PRILOSEC Take 20 mg by mouth daily with supper.   ondansetron 4 MG tablet Commonly known as: Zofran Take 1 tablet (4 mg total) by mouth every 8 (eight) hours as needed for nausea or vomiting.   OneTouch Delica Plus 123456 Misc USE TO CHECK BLOOD SUGAR TWO TIMES DAILY   OneTouch Verio Reflect w/Device Kit TEST TWICE A DAY   OneTouch Verio test strip Generic drug: glucose blood USE TO CHECK BLOOD SUGAR TWO TIMES A DAY   potassium chloride SA 20 MEQ tablet Commonly known as: KLOR-CON M TAKE 1 TABLET BY MOUTH DAILY.   repaglinide 0.5 MG tablet Commonly known as: PRANDIN Take 2 tablets (1 mg total) by mouth daily before lunch. before breakfast and lunch What changed: additional instructions   Ubrelvy 100 MG Tabs Generic drug: Ubrogepant TAKE 1 TABLET BY  MOUTH AS NEEDED (MAY REPEAT DOSE AFTER 2 HOURS IF NEEDED. MAXIMUM 2 TABLETS IN 24  HOURS).   Vitamin D (Ergocalciferol) 1.25 MG (50000 UNIT) Caps capsule Commonly known as: DRISDOL TAKE 1 CAPSULE BY MOUTH ONCE A WEEK.   Vyepti 100 MG/ML injection Generic drug: Eptinezumab-jjmr Inject 300 mg into the vein every 3 (three) months.         ALLERGIES: Allergies  Allergen Reactions   Sudafed [Pseudoephedrine Hcl] Hives, Itching and Anxiety   Atenolol Other (See Comments)    Migraine   Codeine Itching and Swelling    REACTION: Migraine   Epinephrine Hives   Hydrocodone Other (See Comments)    Migraine   Ibuprofen Other (See Comments)    Migraine   Jardiance [Empagliflozin] Other (See Comments)    Caused uti Yeast infection   Metronidazole Swelling   Oxycodone Other (See Comments)    Migraine   Penicillins Itching and Swelling    ** Tolerates cephalosporins Has patient had a PCN reaction causing immediate rash, facial/tongue/throat swelling, SOB or lightheadedness with hypotension: No Has patient had a PCN reaction causing severe rash involving mucus membranes or skin necrosis: No Has patient had a PCN reaction that required hospitalization: No Has patient had a PCN reaction occurring within the last 10 years: No     Pentazocine Lactate     REACTION: Swelling, itching, rash   Promethazine Other (See Comments)    migraine   Propranolol Other (See Comments)    Migraine   Talwin [Pentazocine] Other (See Comments)    Swelling and itching    Tramadol Other (See Comments)    Migraine   Vancomycin Swelling   Crestor [Rosuvastatin] Other (See Comments)    Muscle pain     REVIEW OF SYSTEMS: A comprehensive ROS was conducted with the patient and is negative except as per HPI    OBJECTIVE:   VITAL SIGNS: BP 124/72 (BP Location: Left Arm, Patient Position: Sitting, Cuff Size: Small)   Pulse 70   Ht 5\' 1"  (1.549 m)   Wt 137 lb 12.8 oz (62.5 kg)   SpO2 95%   BMI 26.04 kg/m    PHYSICAL EXAM:  General: Pt appears well and is in NAD  Neck: General:  Supple without adenopathy or carotid bruits. Thyroid: Thyroid size normal.  No goiter or nodules appreciated.   Lungs: Clear with good BS bilat   Heart: RRR   Extremities:  Lower extremities - No pretibial edema.  Neuro: MS is good with appropriate affect, pt is alert and Ox3    DM foot exam: 11/11/2022   DATA REVIEWED:  Lab Results  Component Value Date   HGBA1C 8.4 (H) 08/16/2022   HGBA1C 7.1 (A) 05/11/2022   HGBA1C 8.0 (A) 02/18/2022    Thyroid ULtrasound 04/2020   Estimated total number of nodules >/= 1 cm: 1   Number of spongiform nodules >/=  2 cm not described below (TR1): 0   Number of mixed cystic and solid nodules >/= 1.5 cm not described below ([place]): 0   _________________________________________________________   Nodule # 4: The previously identified TI-RADS category 4 nodule in the right inferior gland is somewhat elongated and dumbbell-shaped. On today's study, slightly higher resolution demonstrates that this actually represents 2 smaller subcentimeter nodules which are adjacent and abutting. Measured independently, neither nodule measures over 1 cm and therefore does not meet criteria for further imaging surveillance. Of note, the nodular conglomerate demonstrates no interval change in size or appearance compared to  the prior imaging from October 2020.   Incidental note again made of multiple cystic and minimally complex cystic/solid nodules scattered throughout the thyroid. None of these meet criteria for further evaluation.   IMPRESSION: The previously identified TI-RADS category 4 nodule in the right inferior gland is identified on the present examination as 2 smaller adjacent and abutting nodules. While the overall conglomerate demonstrates no change compared to the prior study, the fact that this is 2 separate smaller nodules results in a down grade of the TI-RADS classification system. The nodules do not meet size criteria to recommend further  evaluation. No further follow-up required.    Old records , labs and images have been reviewed.     ASSESSMENT / PLAN / RECOMMENDATIONS:   1) Type 2 Diabetes Mellitus, Poorly controlled, With CKD III complications - Most recent A1c of 8.4 %. Goal A1c < 7.0 %.     -Patient is not able to follow a low-carb diet due to autoimmune pancreatitis, chronic GI symptoms, and migraine headaches which limits her dietary options.  Our goal is to prevent hypoglycemia and severe hyperglycemia - Pt with hx of pancreatitis hence GLP-1 agonist and DPP 4 inhibitors as well as Tirzepatide are all contraindicated - Intolerant to Jardiance due to recurrent genital infections    MEDICATIONS: Continue  Lantus 18 units daily Continue Repaglinidie 0.5 mg, 2 tablets Before Lunch   EDUCATION / INSTRUCTIONS: BG monitoring instructions: Patient is instructed to check her blood sugars 1 times a day, fasting. Call [facility] if: BG persistently < 70  I reviewed the Rule of 15 for the treatment of hypoglycemia in detail with the patient. Literature supplied.   2) Diabetic complications:  Eye: Does not have known diabetic retinopathy.  Neuro/ Feet: Does not have known diabetic peripheral neuropathy. Renal: Patient does  have known baseline CKD. She is  on an ACEI/ARB at present.   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 -NO need for hydrocortisone, as she is already getting supraphysiologic corticosteroids to budesonide -Once the GI determines that budesonide is not needed anymore, will need to gradually reduce the  dose    Follow-up in 3 months      Signed electronically by: Mack Guise, MD  [facility] Endocrinology  [address] [place], [place] [address] Phone: [phone] FAX: [phone]   CC: [name], MD [place] [address] Phone: [phone]  Fax: [phone]    Return to Endocrinology clinic as below: Future Appointments  Date Time Provider Department Center  09/15/2022  9:50 AM Mansouraty, Telford Nab., MD [facility]  11/12/2022 10:10 AM Areya Lemmerman, Melanie Crazier, MD [facility] None  02/15/2023  9:50 AM Pieter Partridge, DO [facility] None

## 2022-09-01 NOTE — Patient Instructions (Signed)
-   Continue  Lantus 18 units daily  - Continue Repaglinide 0.5 mg , 2 tablets before  Lunch     HOW TO TREAT LOW BLOOD SUGARS (Blood sugar LESS THAN 70 MG/DL) Please follow the RULE OF 15 for the treatment of hypoglycemia treatment (when your (blood sugars are less than 70 mg/dL)   STEP 1: Take 15 grams of carbohydrates when your blood sugar is low, which includes:  3-4 GLUCOSE TABS  OR 3-4 OZ OF JUICE OR REGULAR SODA OR ONE TUBE OF GLUCOSE GEL    STEP 2: RECHECK blood sugar in 15 MINUTES STEP 3: If your blood sugar is still low at the 15 minute recheck --> then, go back to STEP 1 and treat AGAIN with another 15 grams of carbohydrates.

## 2022-09-02 ENCOUNTER — Ambulatory Visit (INDEPENDENT_AMBULATORY_CARE_PROVIDER_SITE_OTHER): Payer: Medicare Other | Admitting: Family Medicine

## 2022-09-02 ENCOUNTER — Encounter: Payer: Self-pay | Admitting: Family Medicine

## 2022-09-02 VITALS — BP 120/64 | HR 95 | Temp 97.6°F | Ht 61.0 in | Wt 137.1 lb

## 2022-09-02 DIAGNOSIS — R509 Fever, unspecified: Secondary | ICD-10-CM | POA: Diagnosis not present

## 2022-09-02 DIAGNOSIS — Z7952 Long term (current) use of systemic steroids: Secondary | ICD-10-CM | POA: Insufficient documentation

## 2022-09-02 LAB — POC URINALSYSI DIPSTICK (AUTOMATED)
Bilirubin, UA: NEGATIVE
Glucose, UA: NEGATIVE
Ketones, UA: NEGATIVE
Nitrite, UA: NEGATIVE
Protein, UA: POSITIVE — AB
Spec Grav, UA: 1.01 (ref 1.010–1.025)
Urobilinogen, UA: 0.2 E.U./dL
pH, UA: 6 (ref 5.0–8.0)

## 2022-09-02 LAB — LIPASE: Lipase: <5 U/L — ABNORMAL LOW (ref 7–60)

## 2022-09-02 NOTE — Progress Notes (Signed)
Patient ID: Debra Leach, female    DOB: December 23, 1950, 72 y.o.   MRN: [id number]  This visit was conducted in person.  BP 120/64   Pulse 95   Temp 97.6 F (36.4 C) (Temporal)   Ht 5\' 1"  (1.549 m)   Wt 137 lb 2 oz (62.2 kg)   SpO2 97%   BMI 25.91 kg/m    CC:  Chief Complaint  Patient presents with   Fever    In the afternoons   Chills   Nausea   Emesis   Fatigue    Subjective:   HPI: Debra Leach is a 72 y.o. female presenting on 09/02/2022 for Fever (In the afternoons), Chills, Nausea, Emesis, and Fatigue  Reviewed today's office visit note with [name]. Of note Dr. Nani Ravens , ENDO.Marland Kitchen  Verified no true adrenal insufficiency.  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   Recent hospitalizations for sepsis from presumed cellulitis and UTI. Hospitalized March 11 and March 14. C. difficile and GI pathogen negative. S/p Imodium as needed, and s/p Probiotics CT A/P negative for any acute findings.  CXR negative. S/p vancomycin and Flagyl, patient developed allergic reaction, developed lip swelling on 3/16 so discontinued vancomycin and Flagyl.   After discharge...she was told no mor antibiotics needed.  At follow-up on March 20 she was doing well with no continued fever.   She now reports about 4 days after hospital discharge she started having intermittent elevated temperature.. usually mid day 99 to 102 F. Resolved by end of day.  She continues to feel very weak.. some days husband needs to lift him up out of bed.  Continue nausea.. not really new for him.  New issue is emesis every 2 to 3 days.  Decreased appetite.  No back pain, no dysuria. No diarrhea.  No skin changes.. no cellulitis   Recent hand surgery.. well healed.  Had fall backward when lost balance.. hit back of head.. yesterday.   Husband wonders if this could be a reaction to azathioprine... started feb 24.  She takes it after breakfast.  Of note.. she did  not take today... she has no fever, no nausea, no weakness.   Relevant past medical, surgical, family and social history reviewed and updated as indicated. Interim medical history since our last visit reviewed. Allergies and medications reviewed and updated. Outpatient Medications Prior to Visit  Medication Sig Dispense Refill   Blood Glucose Monitoring Suppl (ONETOUCH VERIO REFLECT) w/Device KIT TEST TWICE A DAY 1 kit 0   budesonide (ENTOCORT EC) 3 MG 24 hr capsule Take 2 capsules (6 mg total) by mouth daily. 60 capsule 12   Continuous Blood Gluc Sensor (DEXCOM G7 SENSOR) MISC 1 Device by Does not apply route as directed. 9 each 3   ELIQUIS 2.5 MG TABS tablet TAKE 1 TABLET BY MOUTH 2 TIMES DAILY. 180 tablet 0   Eptinezumab-jjmr (VYEPTI) 100 MG/ML injection Inject 300 mg into the vein every 3 (three) months.     ferrous gluconate (FERGON) 324 MG tablet Take 1 tablet (324 mg total) by mouth daily with breakfast. 30 tablet 6   gabapentin (NEURONTIN) 100 MG capsule TAKE 1 CAPSULE BY MOUTH UP TO 3 TIMES DAILY AS NEEDED. 270 capsule 3   insulin glargine (LANTUS SOLOSTAR) 100 UNIT/ML Solostar Pen Inject 18 Units into the skin daily.     Lancets (ONETOUCH DELICA PLUS LANCET30G) MISC USE TO CHECK BLOOD SUGAR TWO TIMES DAILY 200 each 3   omeprazole (  PRILOSEC) 20 MG capsule Take 20 mg by mouth daily with supper.     ondansetron (ZOFRAN) 4 MG tablet Take 1 tablet (4 mg total) by mouth every 8 (eight) hours as needed for nausea or vomiting. 20 tablet 6   ONETOUCH VERIO test strip USE TO CHECK BLOOD SUGAR TWO TIMES A DAY 200 strip 3   repaglinide (PRANDIN) 0.5 MG tablet Take 2 tablets (1 mg total) by mouth daily before lunch. before breakfast and lunch 180 tablet 3   UBRELVY 100 MG TABS TAKE 1 TABLET BY MOUTH AS NEEDED (MAY REPEAT DOSE AFTER 2 HOURS IF NEEDED. MAXIMUM 2 TABLETS IN 24 HOURS). 10 tablet 3   vitamin B-12 (CYANOCOBALAMIN) 1000 MCG tablet Take 1,000 mcg by mouth daily with breakfast.     Vitamin  D, Ergocalciferol, (DRISDOL) 1.25 MG (50000 UNIT) CAPS capsule TAKE 1 CAPSULE BY MOUTH ONCE A WEEK. 12 capsule 3   atorvastatin (LIPITOR) 80 MG tablet Take 1 tablet (80 mg total) by mouth at bedtime.     azaTHIOprine (IMURAN) 50 MG tablet Take 1 tablet (50 mg total) by mouth daily. 30 tablet 6   colesevelam (WELCHOL) 625 MG tablet TAKE 3 TABLETS BY MOUTH 2 TIMES DAILY WITH A MEAL. 540 tablet 3   gabapentin (NEURONTIN) 300 MG capsule TAKE 2 CAPSULES BY MOUTH AT BEDTIME. 180 capsule 0   potassium chloride SA (KLOR-CON M) 20 MEQ tablet TAKE 1 TABLET BY MOUTH DAILY. 90 tablet 1   No facility-administered medications prior to visit.     Per HPI unless specifically indicated in ROS section below Review of Systems  Constitutional:  Positive for fatigue and fever.  HENT:  Negative for congestion.   Eyes:  Negative for pain.  Respiratory:  Negative for cough and shortness of breath.   Cardiovascular:  Negative for chest pain, palpitations and leg swelling.  Gastrointestinal:  Positive for nausea. Negative for abdominal pain.  Genitourinary:  Negative for dysuria and vaginal bleeding.  Musculoskeletal:  Negative for back pain.  Neurological:  Positive for weakness. Negative for syncope, light-headedness and headaches.  Psychiatric/Behavioral:  Negative for dysphoric mood.    Objective:  BP 120/64   Pulse 95   Temp 97.6 F (36.4 C) (Temporal)   Ht 5\' 1"  (1.549 m)   Wt 137 lb 2 oz (62.2 kg)   SpO2 97%   BMI 25.91 kg/m   Wt Readings from Last 3 Encounters:  09/15/22 134 lb (60.8 kg)  09/13/22 130 lb 6.4 oz (59.1 kg)  09/02/22 137 lb 2 oz (62.2 kg)      Physical Exam Constitutional:      General: She is not in acute distress.    Appearance: Normal appearance. She is well-developed. She is ill-appearing. She is not toxic-appearing.  HENT:     Head: Normocephalic.     Right Ear: Hearing, tympanic membrane, ear canal and external ear normal. Tympanic membrane is not erythematous,  retracted or bulging.     Left Ear: Hearing, tympanic membrane, ear canal and external ear normal. Tympanic membrane is not erythematous, retracted or bulging.     Nose: No mucosal edema or rhinorrhea.     Right Sinus: No maxillary sinus tenderness or frontal sinus tenderness.     Left Sinus: No maxillary sinus tenderness or frontal sinus tenderness.     Mouth/Throat:     Pharynx: Uvula midline.  Eyes:     General: Lids are normal. Lids are everted, no foreign bodies appreciated.  Conjunctiva/sclera: Conjunctivae normal.     Pupils: Pupils are equal, round, and reactive to light.  Neck:     Thyroid: No thyroid mass or thyromegaly.     Vascular: No carotid bruit.     Trachea: Trachea normal.  Cardiovascular:     Rate and Rhythm: Normal rate and regular rhythm.     Pulses: Normal pulses.     Heart sounds: Normal heart sounds, S1 normal and S2 normal. No murmur heard.    No friction rub. No gallop.  Pulmonary:     Effort: Pulmonary effort is normal. No tachypnea or respiratory distress.     Breath sounds: Normal breath sounds. No decreased breath sounds, wheezing, rhonchi or rales.  Abdominal:     General: Bowel sounds are normal.     Palpations: Abdomen is soft.     Tenderness: There is no abdominal tenderness.  Musculoskeletal:     Cervical back: Normal range of motion and neck supple.  Skin:    General: Skin is warm and dry.     Findings: No rash.  Neurological:     Mental Status: She is alert.  Psychiatric:        Mood and Affect: Mood is not anxious or depressed.        Speech: Speech normal.        Behavior: Behavior normal. Behavior is cooperative.        Thought Content: Thought content normal.        Judgment: Judgment normal.       Results for orders placed or performed in visit on 09/02/22  Culture, blood (single) w Reflex to ID Panel   Specimen: Blood  Result Value Ref Range   MICRO NUMBER: [id number]    SPECIMEN QUALITY: Adequate    Source BLOOD    STATUS:  FINAL    Result: No growth after 5 days    COMMENT: Aerobic and anaerobic bottle received.   Urine Culture   Specimen: Blood  Result Value Ref Range   MICRO NUMBER: [id number]    SPECIMEN QUALITY: Adequate    Sample Source URINE    STATUS: FINAL    Result:      Mixed genital flora isolated. These superficial bacteria are not indicative of a urinary tract infection. No further organism identification is warranted on this specimen. If clinically indicated, recollect clean-catch, mid-stream urine and transfer  immediately to Urine Culture Transport Tube.   Comprehensive metabolic panel  Result Value Ref Range   Glucose, Bld 164 (H) 65 - 99 mg/dL   BUN 10 7 - 25 mg/dL   Creat 1.47 8.29 - 5.62 mg/dL   BUN/Creatinine Ratio SEE NOTE: 6 - 22 (calc)   Sodium 141 135 - 146 mmol/L   Potassium 3.8 3.5 - 5.3 mmol/L   Chloride 102 98 - 110 mmol/L   CO2 28 20 - 32 mmol/L   Calcium 9.3 8.6 - 10.4 mg/dL   Total Protein 6.2 6.1 - 8.1 g/dL   Albumin 3.3 (L) 3.6 - 5.1 g/dL   Globulin 2.9 1.9 - 3.7 g/dL (calc)   AG Ratio 1.1 1.0 - 2.5 (calc)   Total Bilirubin 0.6 0.2 - 1.2 mg/dL   Alkaline phosphatase (APISO) 62 37 - 153 U/L   AST 41 (H) 10 - 35 U/L   ALT 23 6 - 29 U/L  C-reactive protein  Result Value Ref Range   CRP 118.1 (H) <8.0 mg/L  Sedimentation rate  Result Value Ref Range   Sed  Rate 80 (H) 0 - 30 mm/h  Lipase  Result Value Ref Range   Lipase <5 (L) 7 - 60 U/L  POCT Urinalysis Dipstick (Automated)  Result Value Ref Range   Color, UA Yellow    Clarity, UA Clear    Glucose, UA Negative Negative   Bilirubin, UA Negative    Ketones, UA Negative    Spec Grav, UA 1.010 1.010 - 1.025   Blood, UA Trace    pH, UA 6.0 5.0 - 8.0   Protein, UA Positive (A) Negative   Urobilinogen, UA 0.2 0.2 or 1.0 E.U./dL   Nitrite, UA Negative    Leukocytes, UA Moderate (2+) (A) Negative    Assessment and Plan  Fever, unspecified fever cause Assessment & Plan: Acute, intermittent Unclear  etiology, possibly infectious versus secondary to medication azathioprine Phone consulted GI MD on-call. Cut down dose to 25 mg daily... if still not tolerating.. hold altogether until follow up with Dr. Judie Petit in 2 weeks.  Will evaluate for other etiologies with labs and urinalysis.   Orders: -     POCT Urinalysis Dipstick (Automated) -     CBC with Differential/Platelet; Future -     Culture, blood (single) w Reflex to ID Panel -     Comprehensive metabolic panel -     C-reactive protein -     Sedimentation rate -     Urine Culture -     Lipase    No follow-ups on file.   Kerby Nora, MD

## 2022-09-02 NOTE — Patient Instructions (Addendum)
Please stop at the lab to have labs drawn. We will call with urine and lab results.  Cut down dose to 25 mg daily... if still not tolerating.. hold altogether until follow up with Dr. Jerilynn Mages in 2 weeks.

## 2022-09-03 LAB — COMPREHENSIVE METABOLIC PANEL
AG Ratio: 1.1 (calc) (ref 1.0–2.5)
ALT: 23 U/L (ref 6–29)
AST: 41 U/L — ABNORMAL HIGH (ref 10–35)
Albumin: 3.3 g/dL — ABNORMAL LOW (ref 3.6–5.1)
Alkaline phosphatase (APISO): 62 U/L (ref 37–153)
BUN: 10 mg/dL (ref 7–25)
CO2: 28 mmol/L (ref 20–32)
Calcium: 9.3 mg/dL (ref 8.6–10.4)
Chloride: 102 mmol/L (ref 98–110)
Creat: 0.75 mg/dL (ref 0.60–1.00)
Globulin: 2.9 g/dL (calc) (ref 1.9–3.7)
Glucose, Bld: 164 mg/dL — ABNORMAL HIGH (ref 65–99)
Potassium: 3.8 mmol/L (ref 3.5–5.3)
Sodium: 141 mmol/L (ref 135–146)
Total Bilirubin: 0.6 mg/dL (ref 0.2–1.2)
Total Protein: 6.2 g/dL (ref 6.1–8.1)

## 2022-09-03 LAB — URINE CULTURE
MICRO NUMBER:: 14755094
SPECIMEN QUALITY:: ADEQUATE

## 2022-09-03 LAB — C-REACTIVE PROTEIN: CRP: 118.1 mg/L — ABNORMAL HIGH (ref <8.0)

## 2022-09-03 LAB — SEDIMENTATION RATE: Sed Rate: 80 mm/h — ABNORMAL HIGH (ref 0–30)

## 2022-09-07 LAB — CULTURE, BLOOD (SINGLE)
MICRO NUMBER:: 14755076
Result:: NO GROWTH
SPECIMEN QUALITY:: ADEQUATE

## 2022-09-08 ENCOUNTER — Telehealth: Payer: Self-pay | Admitting: Family Medicine

## 2022-09-08 NOTE — Telephone Encounter (Signed)
Contacted [facility] to schedule their annual wellness visit. Appointment made for 09/14/2022.  [place] Direct Dial: [phone]

## 2022-09-13 ENCOUNTER — Telehealth: Payer: Self-pay

## 2022-09-13 ENCOUNTER — Ambulatory Visit (INDEPENDENT_AMBULATORY_CARE_PROVIDER_SITE_OTHER): Payer: Medicare Other

## 2022-09-13 VITALS — Ht 60.0 in | Wt 130.4 lb

## 2022-09-13 DIAGNOSIS — Z0000 Encounter for general adult medical examination without abnormal findings: Secondary | ICD-10-CM | POA: Diagnosis not present

## 2022-09-13 NOTE — Telephone Encounter (Signed)
Clinical notes faxed through Epic per fax request

## 2022-09-13 NOTE — Progress Notes (Signed)
I connected with  Debra Leach on 09/13/22 by a audio enabled telemedicine application and verified that I am speaking with the correct person using two identifiers.  Patient Location: Home  Provider Location: Office/Clinic  I discussed the limitations of evaluation and management by telemedicine. The patient expressed understanding and agreed to proceed.  Subjective:   Debra Leach is a 72 y.o. female who presents for Medicare Annual (Subsequent) preventive examination.  Review of Systems      Cardiac Risk Factors include: advanced age (>44men, >59 women);hypertension;diabetes mellitus     Objective:    Today's Vitals   09/13/22 0902  Weight: 130 lb 6.4 oz (59.1 kg)  Height: 5' (1.524 m)   Body mass index is 25.47 kg/m.     09/13/2022    9:23 AM 08/19/2022   10:56 PM 08/19/2022    5:19 PM 08/17/2022    7:48 AM 08/16/2022   10:57 PM 05/12/2022   11:08 AM 01/21/2022   10:39 AM  Advanced Directives  Does Patient Have a Medical Advance Directive? Yes Yes Yes No No No Yes  Type of Estate agent of [place];Living will Healthcare Power of [place];Living will Healthcare Power of [place];Living will      Does patient want to make changes to medical advance directive? No - Patient declined No - Patient declined     Yes (ED - Information included in AVS)  Copy of Healthcare Power of Attorney in Chart? Yes - validated most recent copy scanned in chart (See row information) Yes - validated most recent copy scanned in chart (See row information)       Would patient like information on creating a medical advance directive? No - Patient declined No - Patient declined  No - Patient declined  No - Patient declined     Current Medications (verified) Outpatient Encounter Medications as of 09/13/2022  Medication Sig   aluminum-magnesium hydroxide 200-200 MG/5ML suspension Take by mouth every 6 (six) hours as needed for indigestion. As needed    atorvastatin (LIPITOR) 80 MG tablet Take 1 tablet (80 mg total) by mouth at bedtime.   Blood Glucose Monitoring Suppl (ONETOUCH VERIO REFLECT) w/Device KIT TEST TWICE A DAY   budesonide (ENTOCORT EC) 3 MG 24 hr capsule Take 2 capsules (6 mg total) by mouth daily.   colesevelam (WELCHOL) 625 MG tablet TAKE 3 TABLETS BY MOUTH 2 TIMES DAILY WITH A MEAL.   Continuous Blood Gluc Sensor (DEXCOM G7 SENSOR) MISC 1 Device by Does not apply route as directed.   ELIQUIS 2.5 MG TABS tablet TAKE 1 TABLET BY MOUTH 2 TIMES DAILY.   Eptinezumab-jjmr (VYEPTI) 100 MG/ML injection Inject 300 mg into the vein every 3 (three) months.   ferrous gluconate (FERGON) 324 MG tablet Take 1 tablet (324 mg total) by mouth daily with breakfast.   gabapentin (NEURONTIN) 100 MG capsule TAKE 1 CAPSULE BY MOUTH UP TO 3 TIMES DAILY AS NEEDED.   gabapentin (NEURONTIN) 300 MG capsule TAKE 2 CAPSULES BY MOUTH AT BEDTIME.   insulin glargine (LANTUS SOLOSTAR) 100 UNIT/ML Solostar Pen Inject 18 Units into the skin daily.   Lancets (ONETOUCH DELICA PLUS LANCET30G) MISC USE TO CHECK BLOOD SUGAR TWO TIMES DAILY   omeprazole (PRILOSEC) 20 MG capsule Take 20 mg by mouth daily with supper.   ondansetron (ZOFRAN) 4 MG tablet Take 1 tablet (4 mg total) by mouth every 8 (eight) hours as needed for nausea or vomiting.   ONETOUCH VERIO test strip USE TO  CHECK BLOOD SUGAR TWO TIMES A DAY   potassium chloride SA (KLOR-CON M) 20 MEQ tablet TAKE 1 TABLET BY MOUTH DAILY.   repaglinide (PRANDIN) 0.5 MG tablet Take 2 tablets (1 mg total) by mouth daily before lunch. before breakfast and lunch   UBRELVY 100 MG TABS TAKE 1 TABLET BY MOUTH AS NEEDED (MAY REPEAT DOSE AFTER 2 HOURS IF NEEDED. MAXIMUM 2 TABLETS IN 24 HOURS).   vitamin B-12 (CYANOCOBALAMIN) 1000 MCG tablet Take 1,000 mcg by mouth daily with breakfast.   Vitamin D, Ergocalciferol, (DRISDOL) 1.25 MG (50000 UNIT) CAPS capsule TAKE 1 CAPSULE BY MOUTH ONCE A WEEK.   azaTHIOprine (IMURAN) 50 MG tablet  Take 1 tablet (50 mg total) by mouth daily.   No facility-administered encounter medications on file as of 09/13/2022.    Allergies (verified) Sudafed [pseudoephedrine hcl], Atenolol, Codeine, Epinephrine, Hydrocodone, Ibuprofen, Jardiance [empagliflozin], Metronidazole, Metronidazole and related, Oxycodone, Penicillins, Pentazocine lactate, Promethazine, Propranolol, Talwin [pentazocine], Tramadol, Vancomycin, and Crestor [rosuvastatin]   History: Past Medical History:  Diagnosis Date   Anemia    Arthritis    osteoarthritis B knees, left hip , right elbow   Autoimmune pancreatitis    Clotting disorder    Complication of anesthesia    difficult waking   Diabetes mellitus without complication    Diet and exercise controlled   Diverticulosis    DVT (deep venous thrombosis) 02/2019   3calf 2 right lung 1 left lung   DVT (deep venous thrombosis) 2020   right leg   Dyspnea    GERD (gastroesophageal reflux disease)    Headache    Hepatic steatosis    Hiatal hernia    History of kidney stones    Hyperlipidemia    Hypertension    06/29/22 has not had to take blood pressure meds in over 1 year.   MVP (mitral valve prolapse)    history of   PONV (postoperative nausea and vomiting)    Pulmonary emboli 03/01/2019   2 in the right; 1 in the left   Past Surgical History:  Procedure Laterality Date   arm surgery  2012   left forearm fracture w/ plates pins screws   ARTERY BIOPSY Right 12/03/2021   Procedure: BIOPSY TEMPORAL ARTERY;  Surgeon: Annice Needy, MD;  Location: [facility] ORS;  Service: Vascular;  Laterality: Right;  Right temple   BREAST BIOPSY Left    BREAST SURGERY  2000   breast biopsy (benign)   CATARACT EXTRACTION Right 2005   CATARACT EXTRACTION Left 2010   LITHOTRIPSY  08/12/2008   stent placed bilaterally   PANCREAS BIOPSY  2021   PANCREAS BIOPSY  2019   PARTIAL HYSTERECTOMY     Both ovaries remain, vaginal, for mennorhagia   REPAIR EXTENSOR TENDON Right 06/30/2022    Procedure: Middle and ring finger sagittal band reconstruction;  Surgeon: Marlyne Beards, MD;  Location: [facility] OR;  Service: Orthopedics;  Laterality: Right;   TONSILLECTOMY     TUBAL LIGATION  1980   Family History  Problem Relation Age of Onset   Cancer Mother        bone   Hypertension Father    Mitral valve prolapse Father    Asthma Brother    Arthritis Brother    Nephrolithiasis Brother    Asthma Brother    COPD Brother    Breast cancer Maternal Aunt    Breast cancer Maternal Aunt    Colon cancer Neg Hx    Esophageal cancer Neg Hx  Inflammatory bowel disease Neg Hx    Liver disease Neg Hx    Pancreatic cancer Neg Hx    Rectal cancer Neg Hx    Stomach cancer Neg Hx    Social History   Socioeconomic History   Marital status: Married    Spouse name: Channing Mutters   Number of children: 0   Years of education: Not on file   Highest education level: Bachelor's degree (e.g., BA, AB, BS)  Occupational History   Occupation: retired Freight forwarder: retired  Tobacco Use   Smoking status: Former    Packs/day: 1.00    Years: 13.00    Additional pack years: 0.00    Total pack years: 13.00    Types: Cigarettes    Quit date: 06/08/1991    Years since quitting: 31.2    Passive exposure: Never   Smokeless tobacco: Never  Vaping Use   Vaping Use: Never used  Substance and Sexual Activity   Alcohol use: No   Drug use: No   Sexual activity: Not on file  Other Topics Concern   Not on file  Social History Narrative   Regular exercise--yes, recumbent bike 3-4 days a week      Diet: fruits and veggies, water, eats at home, drinks a lot of milk      Patient is right-handed. She drinks 1-2 cups of coffee a day.      One story home   Social Determinants of Health   Financial Resource Strain: Low Risk  (09/13/2022)   Overall Financial Resource Strain (CARDIA)    Difficulty of Paying Living Expenses: Not hard at all  Food Insecurity: No Food Insecurity (09/13/2022)    Hunger Vital Sign    Worried About Running Out of Food in the Last Year: Never true    Ran Out of Food in the Last Year: Never true  Transportation Needs: No Transportation Needs (09/13/2022)   PRAPARE - Administrator, Civil Service (Medical): No    Lack of Transportation (Non-Medical): No  Physical Activity: Insufficiently Active (09/13/2022)   Exercise Vital Sign    Days of Exercise per Week: 4 days    Minutes of Exercise per Session: 30 min  Stress: No Stress Concern Present (09/13/2022)   [employer] of Occupational Health - Occupational Stress Questionnaire    Feeling of Stress : Not at all  Social Connections: Moderately Isolated (09/13/2022)   Social Connection and Isolation Panel [NHANES]    Frequency of Communication with Friends and Family: Twice a week    Frequency of Social Gatherings with Friends and Family: Three times a week    Attends Religious Services: Never    Active Member of Clubs or Organizations: No    Attends Banker Meetings: Never    Marital Status: Married    Tobacco Counseling Counseling given: Not Answered   Clinical Intake:  Pre-visit preparation completed: Yes  Pain : No/denies pain     Nutritional Risks: Nausea/ vomitting/ diarrhea, Unintentional weight loss (loose stools since in the hospital in March, lost 8.6lbs since March 8th.) Diabetes: Yes CBG done?: No Did pt. bring in CBG monitor from home?: No  How often do you need to have someone help you when you read instructions, pamphlets, or other written materials from your doctor or pharmacy?: 1 - Never  Diabetic?Nutrition Risk Assessment:  Has the patient had any N/V/D within the last 2 months?  Yes  Does the patient have any non-healing  wounds?  No  Has the patient had any unintentional weight loss or weight gain?  Yes   Diabetes:  Is the patient diabetic?  Yes  If diabetic, was a CBG obtained today?  Yes  93 this morning per pt. Did the patient bring in  their glucometer from home?  No  How often do you monitor your CBG's? BID.   Financial Strains and Diabetes Management:  Are you having any financial strains with the device, your supplies or your medication? No .  Does the patient want to be seen by Chronic Care Management for management of their diabetes?  No  Would the patient like to be referred to a Nutritionist or for Diabetic Management?  No   Diabetic Exams:  Diabetic Eye Exam: Completed 11/10/21 Dr.Nice Diabetic Foot Exam: Completed 09/10/22 PCP    Interpreter Needed?: No  Information entered by :: C.Anderson Coppock LPN   Activities of Daily Living    09/13/2022    9:25 AM 09/10/2022   11:24 AM  In your present state of health, do you have any difficulty performing the following activities:  Hearing? 0 0  Vision? 0 0  Difficulty concentrating or making decisions? 0 0  Walking or climbing stairs? 0 0  Dressing or bathing? 0 0  Doing errands, shopping? 0 0  Preparing Food and eating ? N N  Using the Toilet? N N  In the past six months, have you accidently leaked urine? Y Y  Comment occasionally   Do you have problems with loss of bowel control? Y Y  Comment due to loose stools   Managing your Medications? N N  Managing your Finances? N N  Housekeeping or managing your Housekeeping? N N    Patient Care Team: Excell SeltzerBedsole, Amy E, MD as PCP - General (Family Medicine) Drema DallasJaffe, Adam R, DO as Consulting Physician (Neurology) Phil DoppAdams, Michelle, [facility] Hospital[facility] as Pharmacist (Pharmacist)  Indicate any recent Medical Services you may have received from other than [facility] providers in the past year (date may be approximate).     Assessment:   This is a routine wellness examination for Debra Leach.  Hearing/Vision screen Hearing Screening - Comments:: aids Vision Screening - Comments:: Glasses - Dr.Nicce  Dietary issues and exercise activities discussed: Current Exercise Habits: Home exercise routine (been sick and unable to exercise in last few  weeks.), Time (Minutes): 30, Frequency (Times/Week): 6, Weekly Exercise (Minutes/Week): 180, Intensity: Mild, Exercise limited by: None identified   Goals Addressed             This Visit's Progress    Patient Stated       Get Digestive tract regulated and start exercising again.       Depression Screen    09/13/2022    9:22 AM 08/25/2022    9:28 AM 05/11/2022    9:14 AM 08/20/2021    8:53 AM 07/15/2020    9:41 AM 04/06/2019    9:09 AM 03/31/2018    8:38 AM  PHQ 2/9 Scores  PHQ - 2 Score 0 0 0 0 0 0 0  PHQ- 9 Score 0 7   6 0     Fall Risk    09/13/2022    9:23 AM 09/10/2022   11:24 AM 08/31/2022    9:15 AM 08/25/2022    9:28 AM 05/11/2022    9:14 AM  Fall Risk   Falls in the past year? 1 1 1 1  0  Number falls in past yr: 0 0 0 0  Comment passed out      Injury with Fall? 0 0 0 0   Risk for fall due to : No Fall Risks   No Fall Risks   Follow up Falls prevention discussed;Falls evaluation completed  Falls evaluation completed Falls evaluation completed Falls evaluation completed    FALL RISK PREVENTION PERTAINING TO THE HOME:  Any stairs in or around the home? Yes  If so, are there any without handrails? Yes  Home free of loose throw rugs in walkways, pet beds, electrical cords, etc? Yes  Adequate lighting in your home to reduce risk of falls? Yes   ASSISTIVE DEVICES UTILIZED TO PREVENT FALLS:  Life alert? No  Use of a cane, walker or w/c? No  Grab bars in the bathroom? Yes  Shower chair or bench in shower? Yes  Elevated toilet seat or a handicapped toilet? No    Cognitive Function:    04/06/2019    9:15 AM  MMSE - Mini Mental State Exam  Orientation to time 5  Orientation to Place 5  Registration 3  Attention/ Calculation 5  Recall 3  Language- repeat 1        09/13/2022    9:28 AM  6CIT Screen  What Year? 0 points  What month? 0 points  What time? 0 points  Count back from 20 0 points  Months in reverse 0 points  Repeat phrase 0 points  Total  Score 0 points    Immunizations Immunization History  Administered Date(s) Administered   Covid-19, Mrna,Vaccine(Spikevax)3yrs and older 04/15/2022   Fluad Quad(high Dose 65+) 02/15/2019, 04/15/2022   Influenza Split 02/15/2012   Influenza Whole 04/24/2009, 02/26/2010   Influenza, High Dose Seasonal PF 02/28/2020, 04/03/2021   Influenza,inj,Quad PF,6+ Mos 02/15/2013, 02/19/2014, 02/21/2015, 02/24/2016, 03/22/2017, 02/07/2018   Moderna SARS-COV2 Booster Vaccination 07/08/2021   PFIZER(Purple Top)SARS-COV-2 Vaccination 06/29/2019, 07/20/2019, 03/07/2020, 10/09/2020   Pneumococcal Conjugate-13 05/18/2016   Pneumococcal Polysaccharide-23 02/21/2015, 07/15/2020   Respiratory Syncytial Virus Vaccine,Recomb Aduvanted(Arexvy) 04/15/2022   Td 06/07/2006, 04/09/2021   Tdap 02/24/2016   Zoster Recombinat (Shingrix) 04/12/2019, 08/15/2019   Zoster, Live 05/27/2011    TDAP status: Up to date  Flu Vaccine status: Up to date  Pneumococcal vaccine status: Up to date  Covid-19 vaccine status: Completed vaccines  Qualifies for Shingles Vaccine? Yes   Zostavax completed Yes   Shingrix Completed?: Yes  Screening Tests Health Maintenance  Topic Date Due   FOOT EXAM  11/11/2022   OPHTHALMOLOGY EXAM  12/11/2022   INFLUENZA VACCINE  01/06/2023   HEMOGLOBIN A1C  02/16/2023   MAMMOGRAM  02/20/2023   Diabetic kidney evaluation - Urine ACR  08/16/2023   Diabetic kidney evaluation - eGFR measurement  09/02/2023   Medicare Annual Wellness (AWV)  09/13/2023   DEXA SCAN  02/20/2024   COLONOSCOPY (Pts 45-18yrs Insurance coverage will need to be confirmed)  07/24/2030   DTaP/Tdap/Td (4 - Td or Tdap) 04/10/2031   Pneumonia Vaccine 57+ Years old  Completed   Hepatitis C Screening  Completed   Zoster Vaccines- Shingrix  Completed   HPV VACCINES  Aged Out   COVID-19 Vaccine  Discontinued    Health Maintenance  There are no preventive care reminders to display for this patient.   Colorectal  cancer screening: Type of screening: Colonoscopy. Completed 07/24/20. Repeat every 10 years  Mammogram status: Completed 02/19/22. Repeat every year  Bone Density status: Completed 02/19/22. Results reflect: Bone density results: OSTEOPENIA. Repeat every 2 years.  Lung Cancer Screening: (Low Dose  CT Chest recommended if Age 37-80 years, 30 pack-year currently smoking OR have quit w/in 15years.) does not qualify.   Lung Cancer Screening Referral: no  Additional Screening:  Hepatitis C Screening: does qualify; Completed 08/18/21  Vision Screening: Recommended annual ophthalmology exams for early detection of glaucoma and other disorders of the eye. Is the patient up to date with their annual eye exam?  Yes  Who is the provider or what is the name of the office in which the patient attends annual eye exams? Dr.Nice If pt is not established with a provider, would they like to be referred to a provider to establish care? No .   Dental Screening: Recommended annual dental exams for proper oral hygiene  Community Resource Referral / Chronic Care Management: CRR required this visit?  No   CCM required this visit?  No      Plan:     I have personally reviewed and noted the following in the patient's chart:   Medical and social history Use of alcohol, tobacco or illicit drugs  Current medications and supplements including opioid prescriptions. Patient is not currently taking opioid prescriptions. Functional ability and status Nutritional status Physical activity Advanced directives List of other physicians Hospitalizations, surgeries, and ER visits in previous 12 months Vitals Screenings to include cognitive, depression, and falls Referrals and appointments  In addition, I have reviewed and discussed with patient certain preventive protocols, quality metrics, and best practice recommendations. A written personalized care plan for preventive services as well as general preventive  health recommendations were provided to patient.     Maryan Puls, LPN   06/12/1094   Nurse Notes: none

## 2022-09-13 NOTE — Patient Instructions (Signed)
[name] , Thank you for taking time to come for your Medicare Wellness Visit. I appreciate your ongoing commitment to your health goals. Please review the following plan we discussed and let me know if I can assist you in the future.   These are the goals we discussed:  Goals      Patient Stated     04/06/2019, I will try to increase my exercise so I can lose some weight.      Patient Stated     Get Digestive tract regulated and start exercising again.     Pharmacy Care Plan     CARE PLAN ENTRY  Current Barriers:  Chronic Disease Management support, education, and care coordination needs related to Hypertension, Diabetes, and Vitamin D Deficiency    Hypertension Pharmacist Clinical Goal(s): Over the next 30 days, patient will work with PharmD and providers to achieve BP goal <140/90 mmHg Current regimen:  Losartan-HCTZ 50-12.5 mg - 1 tablet daily Interventions: Discussed increasing losartan/HCTZ dose to target BP goal Recommend assessing home blood pressure for 7 days before making dose adjustment Patient self care activities - Over the next 30 days, patient will:  Check blood pressure for 7 days leading up to next appointment in 4 weeks. Check BP before breakfast (and coffee) and before supper. Keep log of readings.  Ensure daily salt intake < 2300 mg/day  Diabetes Pharmacist Clinical Goal(s): Over the next 30 days, patient will work with PharmD and providers to achieve A1c goal <7% Current regimen:  Lantus - Inject 45 units daily and continue to reduce by 5 units every 4-7 days as long as fasting BG remains < 130 and post-prandial < 180 Interventions: Reviewed current insulin dose and discussed safe taper schedule Patient self care activities - Over the next 30  days, patient will: Check blood sugar twice daily, document, and provide at future appointment with PharmD Contact provider with any episodes of hypoglycemia  Vitamin D Deficiency  Pharmacist Clinical  Goal(s) Over the next 30 days, patient will work with PharmD and providers to improve vitamin D level within normal range  Current regimen:  Vitamin D3 1000 IU - 1 capsule daily Interventions: Recommend increasing vitamin D3 to 2000 IU daily Patient self care activities - Over the next 30 days, patient will: Increase vitamin D3 to 2000 units daily   Initial goal documentation        This is a list of the screening recommended for you and due dates:  Health Maintenance  Topic Date Due   Complete foot exam   11/11/2022   Eye exam for diabetics  12/11/2022   Flu Shot  01/06/2023   Hemoglobin A1C  02/16/2023   Mammogram  02/20/2023   Yearly kidney health urinalysis for diabetes  08/16/2023   Yearly kidney function blood test for diabetes  09/02/2023   Medicare Annual Wellness Visit  09/13/2023   DEXA scan (bone density measurement)  02/20/2024   Colon Cancer Screening  07/24/2030   DTaP/Tdap/Td vaccine (4 - Td or Tdap) 04/10/2031   Pneumonia Vaccine  Completed   Hepatitis C Screening: USPSTF Recommendation to screen - Ages 60-79 yo.  Completed   Zoster (Shingles) Vaccine  Completed   HPV Vaccine  Aged Out   COVID-19 Vaccine  Discontinued    Advanced directives: copy on file in the chart.  Conditions/risks identified: Aim for 30 minutes of exercise or brisk walking, 6-8 glasses of water, and 5 servings of fruits and vegetables each day.  Next appointment: Follow up in one year for your annual wellness visit 09/14/2023 @ 9:00 telephone visit   Preventive Care 65 Years and Older, Female Preventive care refers to lifestyle choices and visits with your health care provider that can promote health and wellness. What does preventive care include? A yearly physical exam. This is also called an annual well check. Dental exams once or twice a year. Routine eye exams. Ask your health care provider how often you should have your eyes checked. Personal lifestyle choices,  including: Daily care of your teeth and gums. Regular physical activity. Eating a healthy diet. Avoiding tobacco and drug use. Limiting alcohol use. Practicing safe sex. Taking low-dose aspirin every day. Taking vitamin and mineral supplements as recommended by your health care provider. What happens during an annual well check? The services and screenings done by your health care provider during your annual well check will depend on your age, overall health, lifestyle risk factors, and family history of disease. Counseling  Your health care provider may ask you questions about your: Alcohol use. Tobacco use. Drug use. Emotional well-being. Home and relationship well-being. Sexual activity. Eating habits. History of falls. Memory and ability to understand (cognition). Work and work Astronomer. Reproductive health. Screening  You may have the following tests or measurements: Height, weight, and BMI. Blood pressure. Lipid and cholesterol levels. These may be checked every 5 years, or more frequently if you are over 56 years old. Skin check. Lung cancer screening. You may have this screening every year starting at age 66 if you have a 30-pack-year history of smoking and currently smoke or have quit within the past 15 years. Fecal occult blood test (FOBT) of the stool. You may have this test every year starting at age 30. Flexible sigmoidoscopy or colonoscopy. You may have a sigmoidoscopy every 5 years or a colonoscopy every 10 years starting at age 18. Hepatitis C blood test. Hepatitis B blood test. Sexually transmitted disease (STD) testing. Diabetes screening. This is done by checking your blood sugar (glucose) after you have not eaten for a while (fasting). You may have this done every 1-3 years. Bone density scan. This is done to screen for osteoporosis. You may have this done starting at age 27. Mammogram. This may be done every 1-2 years. Talk to your health care provider  about how often you should have regular mammograms. Talk with your health care provider about your test results, treatment options, and if necessary, the need for more tests. Vaccines  Your health care provider may recommend certain vaccines, such as: Influenza vaccine. This is recommended every year. Tetanus, diphtheria, and acellular pertussis (Tdap, Td) vaccine. You may need a Td booster every 10 years. Zoster vaccine. You may need this after age 66. Pneumococcal 13-valent conjugate (PCV13) vaccine. One dose is recommended after age 57. Pneumococcal polysaccharide (PPSV23) vaccine. One dose is recommended after age 20. Talk to your health care provider about which screenings and vaccines you need and how often you need them. This information is not intended to replace advice given to you by your health care provider. Make sure you discuss any questions you have with your health care provider. Document Released: 06/20/2015 Document Revised: 02/11/2016 Document Reviewed: 03/25/2015 Elsevier Interactive Patient Education  2017 [employer].  Fall Prevention in the Home Falls can cause injuries. They can happen to people of all ages. There are many things you can do to make your home safe and to help prevent falls. What can I do  on the outside of my home? Regularly fix the edges of walkways and driveways and fix any cracks. Remove anything that might make you trip as you walk through a door, such as a raised step or threshold. Trim any bushes or trees on the path to your home. Use bright outdoor lighting. Clear any walking paths of anything that might make someone trip, such as rocks or tools. Regularly check to see if handrails are loose or broken. Make sure that both sides of any steps have handrails. Any raised decks and porches should have guardrails on the edges. Have any leaves, snow, or ice cleared regularly. Use sand or salt on walking paths during winter. Clean up any spills in  your garage right away. This includes oil or grease spills. What can I do in the bathroom? Use night lights. Install grab bars by the toilet and in the tub and shower. Do not use towel bars as grab bars. Use non-skid mats or decals in the tub or shower. If you need to sit down in the shower, use a plastic, non-slip stool. Keep the floor dry. Clean up any water that spills on the floor as soon as it happens. Remove soap buildup in the tub or shower regularly. Attach bath mats securely with double-sided non-slip rug tape. Do not have throw rugs and other things on the floor that can make you trip. What can I do in the bedroom? Use night lights. Make sure that you have a light by your bed that is easy to reach. Do not use any sheets or blankets that are too big for your bed. They should not hang down onto the floor. Have a firm chair that has side arms. You can use this for support while you get dressed. Do not have throw rugs and other things on the floor that can make you trip. What can I do in the kitchen? Clean up any spills right away. Avoid walking on wet floors. Keep items that you use a lot in easy-to-reach places. If you need to reach something above you, use a strong step stool that has a grab bar. Keep electrical cords out of the way. Do not use floor polish or wax that makes floors slippery. If you must use wax, use non-skid floor wax. Do not have throw rugs and other things on the floor that can make you trip. What can I do with my stairs? Do not leave any items on the stairs. Make sure that there are handrails on both sides of the stairs and use them. Fix handrails that are broken or loose. Make sure that handrails are as long as the stairways. Check any carpeting to make sure that it is firmly attached to the stairs. Fix any carpet that is loose or worn. Avoid having throw rugs at the top or bottom of the stairs. If you do have throw rugs, attach them to the floor with carpet  tape. Make sure that you have a light switch at the top of the stairs and the bottom of the stairs. If you do not have them, ask someone to add them for you. What else can I do to help prevent falls? Wear shoes that: Do not have high heels. Have rubber bottoms. Are comfortable and fit you well. Are closed at the toe. Do not wear sandals. If you use a stepladder: Make sure that it is fully opened. Do not climb a closed stepladder. Make sure that both sides of the stepladder are  locked into place. Ask someone to hold it for you, if possible. Clearly mark and make sure that you can see: Any grab bars or handrails. First and last steps. Where the edge of each step is. Use tools that help you move around (mobility aids) if they are needed. These include: Canes. Walkers. Scooters. Crutches. Turn on the lights when you go into a dark area. Replace any light bulbs as soon as they burn out. Set up your furniture so you have a clear path. Avoid moving your furniture around. If any of your floors are uneven, fix them. If there are any pets around you, be aware of where they are. Review your medicines with your doctor. Some medicines can make you feel dizzy. This can increase your chance of falling. Ask your doctor what other things that you can do to help prevent falls. This information is not intended to replace advice given to you by your health care provider. Make sure you discuss any questions you have with your health care provider. Document Released: 03/20/2009 Document Revised: 10/30/2015 Document Reviewed: 06/28/2014 Elsevier Interactive Patient Education  2017 [employer]

## 2022-09-14 ENCOUNTER — Other Ambulatory Visit: Payer: Self-pay | Admitting: Family Medicine

## 2022-09-14 ENCOUNTER — Other Ambulatory Visit: Payer: Self-pay | Admitting: Neurology

## 2022-09-14 DIAGNOSIS — G8929 Other chronic pain: Secondary | ICD-10-CM

## 2022-09-15 ENCOUNTER — Encounter: Payer: Self-pay | Admitting: Family Medicine

## 2022-09-15 ENCOUNTER — Encounter: Payer: Self-pay | Admitting: Gastroenterology

## 2022-09-15 ENCOUNTER — Other Ambulatory Visit (INDEPENDENT_AMBULATORY_CARE_PROVIDER_SITE_OTHER): Payer: Medicare Other

## 2022-09-15 ENCOUNTER — Ambulatory Visit (INDEPENDENT_AMBULATORY_CARE_PROVIDER_SITE_OTHER): Payer: Medicare Other | Admitting: Gastroenterology

## 2022-09-15 VITALS — BP 138/78 | HR 99 | Ht 60.5 in | Wt 134.0 lb

## 2022-09-15 DIAGNOSIS — D8984 IgG4-related disease: Secondary | ICD-10-CM | POA: Diagnosis not present

## 2022-09-15 DIAGNOSIS — R634 Abnormal weight loss: Secondary | ICD-10-CM

## 2022-09-15 DIAGNOSIS — R197 Diarrhea, unspecified: Secondary | ICD-10-CM

## 2022-09-15 DIAGNOSIS — D84821 Immunodeficiency due to drugs: Secondary | ICD-10-CM | POA: Diagnosis not present

## 2022-09-15 DIAGNOSIS — K861 Other chronic pancreatitis: Secondary | ICD-10-CM

## 2022-09-15 DIAGNOSIS — D8989 Other specified disorders involving the immune mechanism, not elsewhere classified: Secondary | ICD-10-CM

## 2022-09-15 DIAGNOSIS — Z7952 Long term (current) use of systemic steroids: Secondary | ICD-10-CM

## 2022-09-15 LAB — SEDIMENTATION RATE: Sed Rate: 24 mm/hr (ref 0–30)

## 2022-09-15 LAB — CBC
HCT: 41 % (ref 36.0–46.0)
Hemoglobin: 13.1 g/dL (ref 12.0–15.0)
MCHC: 32 g/dL (ref 30.0–36.0)
MCV: 85.4 fl (ref 78.0–100.0)
Platelets: 347 10*3/uL (ref 150.0–400.0)
RBC: 4.8 Mil/uL (ref 3.87–5.11)
RDW: 18.5 % — ABNORMAL HIGH (ref 11.5–15.5)
WBC: 14 10*3/uL — ABNORMAL HIGH (ref 4.0–10.5)

## 2022-09-15 LAB — LIPASE: Lipase: 1 U/L — ABNORMAL LOW (ref 11.0–59.0)

## 2022-09-15 LAB — C-REACTIVE PROTEIN: CRP: <1 mg/dL (ref 0.5–20.0)

## 2022-09-15 LAB — CK: Total CK: 32 U/L (ref 7–177)

## 2022-09-15 LAB — AMYLASE: Amylase: 15 U/L — ABNORMAL LOW (ref 27–131)

## 2022-09-15 NOTE — Telephone Encounter (Signed)
Not on current medication list.   

## 2022-09-15 NOTE — Patient Instructions (Signed)
You have been given a testing kit to check for small intestine bacterial overgrowth (SIBO) which is completed by a company named [facility]. Make sure to return your test in the mail using the return mailing label given to you along with the kit. Your demographic and insurance information have already been sent to the company and they should be in contact with you over the next 1-2 weeks regarding this test. [facility] will collect an upfront charge of $99.74 for commercial insurance plans and $209.74 is you are paying cash. Make sure to discuss with [facility] PRIOR to having the test to see if they have gotten information from your insurance company as to how much your testing will cost out of pocket, if any. Please keep in mind that you will be getting a call from phone number [phone] or a similar number. If you do not hear from them within this time frame, please call our office at [phone] or call [facility] directly at [phone].   Your provider has requested that you go to the basement level for lab work before leaving today. Press "B" on the elevator. The lab is located at the first door on the left as you exit the elevator.  Take Imodium 4 mg once daily in the morning. (Up to 12 mg total daily).  _______________________________________________________  If your blood pressure at your visit was 140/90 or greater, please contact your primary care physician to follow up on this.  _______________________________________________________  If you are age 72 or older, your body mass index should be between 23-30. Your Body mass index is 25.74 kg/m. If this is out of the aforementioned range listed, please consider follow up with your Primary Care Provider.  If you are age 100 or younger, your body mass index should be between 19-25. Your Body mass index is 25.74 kg/m. If this is out of the aformentioned range listed, please consider follow up with your Primary Care  Provider.   ________________________________________________________  The [facility] providers would like to encourage you to use [facility] to communicate with providers for non-urgent requests or questions.  Due to long hold times on the telephone, sending your provider a message by [facility] may be a faster and more efficient way to get a response.  Please allow 48 business hours for a response.  Please remember that this is for non-urgent requests.  _______________________________________________________  Thank you for choosing me and [facility].  Dr. Meridee Score

## 2022-09-16 ENCOUNTER — Other Ambulatory Visit: Payer: Self-pay | Admitting: Family Medicine

## 2022-09-16 LAB — IGG 4: IgG, Subclass 4: 278 mg/dL — ABNORMAL HIGH (ref 2–96)

## 2022-09-16 MED ORDER — CLOTRIMAZOLE-BETAMETHASONE 1-0.05 % EX CREA: 1.0000 | TOPICAL_CREAM | Freq: Two times a day (BID) | CUTANEOUS | 0 refills | Status: DC

## 2022-09-17 ENCOUNTER — Other Ambulatory Visit: Payer: Medicare Other

## 2022-09-17 DIAGNOSIS — R197 Diarrhea, unspecified: Secondary | ICD-10-CM

## 2022-09-17 DIAGNOSIS — R634 Abnormal weight loss: Secondary | ICD-10-CM

## 2022-09-17 DIAGNOSIS — D8984 IgG4-related disease: Secondary | ICD-10-CM

## 2022-09-17 DIAGNOSIS — D8989 Other specified disorders involving the immune mechanism, not elsewhere classified: Secondary | ICD-10-CM

## 2022-09-20 ENCOUNTER — Encounter: Payer: Self-pay | Admitting: Gastroenterology

## 2022-09-21 ENCOUNTER — Encounter: Payer: Self-pay | Admitting: Gastroenterology

## 2022-09-21 DIAGNOSIS — R634 Abnormal weight loss: Secondary | ICD-10-CM | POA: Insufficient documentation

## 2022-09-21 DIAGNOSIS — R509 Fever, unspecified: Secondary | ICD-10-CM | POA: Insufficient documentation

## 2022-09-21 DIAGNOSIS — R197 Diarrhea, unspecified: Secondary | ICD-10-CM | POA: Insufficient documentation

## 2022-09-21 LAB — CLOSTRIDIUM DIFFICILE TOXIN B, QUALITATIVE, REAL-TIME PCR: Toxigenic C. Difficile by PCR: DETECTED — AB

## 2022-09-21 NOTE — Assessment & Plan Note (Signed)
Acute, intermittent Unclear etiology, possibly infectious versus secondary to medication azathioprine Phone consulted GI MD on-call. Cut down dose to 25 mg daily... if still not tolerating.. hold altogether until follow up with Dr. Judie Petit in 2 weeks.  Will evaluate for other etiologies with labs and urinalysis.

## 2022-09-21 NOTE — Progress Notes (Signed)
GASTROENTEROLOGY OUTPATIENT CLINIC VISIT   Primary Care Provider Excell Seltzer, MD [address] [place] [address] [phone]  Patient Profile: Debra Leach is a 72 y.o. 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  The patient presents to the [facility] for an evaluation and management of problem(s) noted below:  Problem List 1. Autoimmune pancreatitis   2. IgG4 related disease   3. Immunosuppression due to chronic steroid use   4. Diarrhea, unspecified type   5. Unintentional weight loss     History of Present Illness Please see prior notes for full details of HPI.  Interval History The patient returns for follow-up.  Unfortunately within the last 6 weeks she has had issues of fevers of unknown etiology and significant weight loss.  The etiology of this was never clearly defined that she did require antibiotic therapy.  With this being said, there had been some concern as to whether her azathioprine could be causing some of her issues.  She has remained on her budesonide therapy.  Overall she is doing better since discharge.  Within the last few weeks she had elevated inflammatory markers checked by her primary care provider.  She has had some looser bowel movements since her antibiotic use in the hospital but was negative for C. difficile when tested previously.  She is wondering if there could be any issues in regards to her pancreatic enzyme production.  She has not noted any blood in her stools.  She has stopped her azathioprine.  GI Review of Systems Positive as above Negative for odynophagia, dysphagia Feng, melena, hematochezia   Review of Systems General: Positive for unintentional weight loss as result of her recent weeks  of illness; denies fevers/chills Cardiovascular: Denies chest pain Pulmonary: Denies shortness of breath Gastroenterological: See HPI Genitourinary: Denies darkened urine Hematological: Denies easy bruising/bleeding Dermatological: Denies jaundice Psychological: Mood is stable   Medications Current Outpatient Medications  Medication Sig Dispense Refill   aluminum-magnesium hydroxide 200-200 MG/5ML suspension Take by mouth every 6 (six) hours as needed for indigestion. As needed     atorvastatin (LIPITOR) 80 MG tablet TAKE 1 TABLET BY MOUTH DAILY 90 tablet 3   Blood Glucose Monitoring Suppl (ONETOUCH VERIO REFLECT) w/Device KIT TEST TWICE A DAY 1 kit 0   budesonide (ENTOCORT EC) 3 MG 24 hr capsule Take 2 capsules (6 mg total) by mouth daily. 60 capsule 12   colesevelam (WELCHOL) 625 MG tablet TAKE 3 TABLETS BY MOUTH 2 TIMES DAILY WITH A MEAL. 540 tablet 3   Continuous Blood Gluc Sensor (DEXCOM G7 SENSOR) MISC 1 Device by Does not apply route as directed. 9 each 3   ELIQUIS 2.5 MG TABS tablet TAKE 1 TABLET BY MOUTH 2 TIMES DAILY. 180 tablet 0   Eptinezumab-jjmr (VYEPTI) 100 MG/ML injection Inject 300 mg into the vein every 3 (three) months.     ferrous gluconate (FERGON) 324 MG tablet Take 1 tablet (324 mg total) by mouth daily with breakfast. 30 tablet 6   gabapentin (NEURONTIN) 100 MG capsule TAKE 1 CAPSULE BY MOUTH UP TO 3 TIMES DAILY AS NEEDED. 270 capsule 3   gabapentin (NEURONTIN) 300 MG capsule TAKE 2 CAPSULES BY MOUTH AT BEDTIME. 180 capsule 1   insulin glargine (LANTUS SOLOSTAR) 100 UNIT/ML Solostar Pen Inject 18 Units into the skin daily.     Lancets (  ONETOUCH DELICA PLUS LANCET30G) MISC USE TO CHECK BLOOD SUGAR TWO TIMES DAILY 200 each 3   omeprazole (PRILOSEC) 20 MG capsule Take 20 mg by mouth daily with supper.     ondansetron (ZOFRAN) 4 MG tablet Take 1 tablet (4 mg total) by mouth every 8 (eight) hours as needed for nausea or vomiting. 20 tablet 6   ONETOUCH VERIO test strip  USE TO CHECK BLOOD SUGAR TWO TIMES A DAY 200 strip 3   potassium chloride SA (KLOR-CON M) 20 MEQ tablet TAKE 1 TABLET BY MOUTH DAILY. 90 tablet 3   repaglinide (PRANDIN) 0.5 MG tablet Take 2 tablets (1 mg total) by mouth daily before lunch. before breakfast and lunch 180 tablet 3   UBRELVY 100 MG TABS TAKE 1 TABLET BY MOUTH AS NEEDED (MAY REPEAT DOSE AFTER 2 HOURS IF NEEDED. MAXIMUM 2 TABLETS IN 24 HOURS). 10 tablet 3   vitamin B-12 (CYANOCOBALAMIN) 1000 MCG tablet Take 1,000 mcg by mouth daily with breakfast.     Vitamin D, Ergocalciferol, (DRISDOL) 1.25 MG (50000 UNIT) CAPS capsule TAKE 1 CAPSULE BY MOUTH ONCE A WEEK. 12 capsule 3   clotrimazole-betamethasone (LOTRISONE) cream Apply 1 Application topically 2 (two) times daily. Do not use longer than 2 weeks in 1 location. 15 g 0   No current facility-administered medications for this visit.    Allergies Allergies  Allergen Reactions   Sudafed [Pseudoephedrine Hcl] Hives, Itching and Anxiety   Atenolol Other (See Comments)    Migraine   Codeine Itching and Swelling    REACTION: Migraine   Epinephrine Hives   Hydrocodone Other (See Comments)    Migraine   Ibuprofen Other (See Comments)    Migraine   Jardiance [Empagliflozin] Other (See Comments)    Caused uti Yeast infection   Metronidazole Swelling   Metronidazole And Related Swelling   Oxycodone Other (See Comments)    Migraine   Penicillins Itching and Swelling    ** Tolerates cephalosporins Has patient had a PCN reaction causing immediate rash, facial/tongue/throat swelling, SOB or lightheadedness with hypotension: No Has patient had a PCN reaction causing severe rash involving mucus membranes or skin necrosis: No Has patient had a PCN reaction that required hospitalization: No Has patient had a PCN reaction occurring within the last 10 years: No     Pentazocine Lactate     REACTION: Swelling, itching, rash   Promethazine Other (See Comments)    migraine   Propranolol  Other (See Comments)    Migraine   Talwin [Pentazocine] Other (See Comments)    Swelling and itching    Tramadol Other (See Comments)    Migraine   Vancomycin Swelling   Crestor [Rosuvastatin] Other (See Comments)    Muscle pain    Histories Past Medical History:  Diagnosis Date   Anemia    Arthritis    osteoarthritis B knees, left hip , right elbow   Autoimmune pancreatitis    Clotting disorder    Complication of anesthesia    difficult waking   Diabetes mellitus without complication    Diet and exercise controlled   Diverticulosis    DVT (deep venous thrombosis) 02/2019   3calf 2 right lung 1 left lung   DVT (deep venous thrombosis) 2020   right leg   Dyspnea    GERD (gastroesophageal reflux disease)    Headache    Hepatic steatosis    Hiatal hernia    History of kidney stones    Hyperlipidemia    Hypertension  06/29/22 has not had to take blood pressure meds in over 1 year.   MVP (mitral valve prolapse)    history of   PONV (postoperative nausea and vomiting)    Pulmonary emboli 03/01/2019   2 in the right; 1 in the left   Past Surgical History:  Procedure Laterality Date   arm surgery  2012   left forearm fracture w/ plates pins screws   ARTERY BIOPSY Right 12/03/2021   Procedure: BIOPSY TEMPORAL ARTERY;  Surgeon: Annice Needy, MD;  Location: [facility] ORS;  Service: Vascular;  Laterality: Right;  Right temple   BREAST BIOPSY Left    BREAST SURGERY  2000   breast biopsy (benign)   CATARACT EXTRACTION Right 2005   CATARACT EXTRACTION Left 2010   LITHOTRIPSY  08/12/2008   stent placed bilaterally   PANCREAS BIOPSY  2021   PANCREAS BIOPSY  2019   PARTIAL HYSTERECTOMY     Both ovaries remain, vaginal, for mennorhagia   REPAIR EXTENSOR TENDON Right 06/30/2022   Procedure: Middle and ring finger sagittal band reconstruction;  Surgeon: Marlyne Beards, MD;  Location: [facility] OR;  Service: Orthopedics;  Laterality: Right;   TONSILLECTOMY     TUBAL LIGATION  1980    Social History   Socioeconomic History   Marital status: Married    Spouse name: Channing Mutters   Number of children: 0   Years of education: Not on file   Highest education level: Bachelor's degree (e.g., BA, AB, BS)  Occupational History   Occupation: retired Freight forwarder: retired  Tobacco Use   Smoking status: Former    Packs/day: 1.00    Years: 13.00    Additional pack years: 0.00    Total pack years: 13.00    Types: Cigarettes    Quit date: 06/08/1991    Years since quitting: 31.3    Passive exposure: Never   Smokeless tobacco: Never  Vaping Use   Vaping Use: Never used  Substance and Sexual Activity   Alcohol use: No   Drug use: No   Sexual activity: Not on file  Other Topics Concern   Not on file  Social History Narrative   Regular exercise--yes, recumbent bike 3-4 days a week      Diet: fruits and veggies, water, eats at home, drinks a lot of milk      Patient is right-handed. She drinks 1-2 cups of coffee a day.      One story home   Social Determinants of Health   Financial Resource Strain: Low Risk  (09/13/2022)   Overall Financial Resource Strain (CARDIA)    Difficulty of Paying Living Expenses: Not hard at all  Food Insecurity: No Food Insecurity (09/13/2022)   Hunger Vital Sign    Worried About Running Out of Food in the Last Year: Never true    Ran Out of Food in the Last Year: Never true  Transportation Needs: No Transportation Needs (09/13/2022)   PRAPARE - Administrator, Civil Service (Medical): No    Lack of Transportation (Non-Medical): No  Physical Activity: Insufficiently Active (09/13/2022)   Exercise Vital Sign    Days of Exercise per Week: 4 days    Minutes of Exercise per Session: 30 min  Stress: No Stress Concern Present (09/13/2022)   [employer] of Occupational Health - Occupational Stress Questionnaire    Feeling of Stress : Not at all  Social Connections: Moderately Isolated (09/13/2022)   Social Connection and  Isolation  Panel [NHANES]    Frequency of Communication with Friends and Family: Twice a week    Frequency of Social Gatherings with Friends and Family: Three times a week    Attends Religious Services: Never    Active Member of Clubs or Organizations: No    Attends Banker Meetings: Never    Marital Status: Married  Catering manager Violence: Not At Risk (09/13/2022)   Humiliation, Afraid, Rape, and Kick questionnaire    Fear of Current or Ex-Partner: No    Emotionally Abused: No    Physically Abused: No    Sexually Abused: No   Family History  Problem Relation Age of Onset   Cancer Mother        bone   Hypertension Father    Mitral valve prolapse Father    Asthma Brother    Arthritis Brother    Nephrolithiasis Brother    Asthma Brother    COPD Brother    Breast cancer Maternal Aunt    Breast cancer Maternal Aunt    Colon cancer Neg Hx    Esophageal cancer Neg Hx    Inflammatory bowel disease Neg Hx    Liver disease Neg Hx    Pancreatic cancer Neg Hx    Rectal cancer Neg Hx    Stomach cancer Neg Hx    I have reviewed her medical, social, and family history in detail and updated the electronic medical record as necessary.    PHYSICAL EXAMINATION  BP 138/78   Pulse 99   Ht 5' 0.5" (1.537 m)   Wt 134 lb (60.8 kg)   SpO2 99%   BMI 25.74 kg/m  Wt Readings from Last 3 Encounters:  09/15/22 134 lb (60.8 kg)  09/13/22 130 lb 6.4 oz (59.1 kg)  09/02/22 137 lb 2 oz (62.2 kg)  GEN: NAD, appears stated age, doesn't appear chronically ill PSYCH: Cooperative, without pressured speech EYE: Conjunctivae pink, sclerae anicteric ENT: MMM CV: Nontachycardic RESP: No audible wheezing GI: NABS, soft, NT/ND, without rebound or guarding MSK/EXT: No pedal edema present SKIN: No jaundice NEURO:  Alert & Oriented x 3, no focal deficits   REVIEW OF DATA  I reviewed the following data at the time of this encounter:  GI Procedures and Studies  2014 colonoscopy  [facility] No colorectal neoplasia Internal hemorrhoids-medium Repeat colonoscopy in 10 years Hemorrhoidal banding  June 2018 EGD The oropharynx was abnormal for 1 to 2 mm mucosal nodules on the soft palate.  Oropharynx was otherwise normal.  The upper esophageal sphincter was easily traversed.  The proximal/mid/distal segments of the esophagus appeared normal.  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  Z-line was 35 cm with a hiatal hernia extending to 40 cm.  Acid reflux or Barrett's.  The gastric cardia/fundus/body/antrum were all normal.  Disease.  The pylorus was symmetric and traversed.  Duodenal bulb and second portion were normal.  Savory dilation was performed disruption.  Savory 15/16/17/18 performed.  Esophageal manometry 2018 LESP 19.6 (normal 13-43) IRP 8.1 (normal less than 15) Esophageal motility 10% fragmented 90% intact UES P70.9 (normal 34-1 04) Bolus clearance 60% clearance by impedance DCIS measures 1444.7 (normal 500-500)  2022 EGD Normal EGD examination. 4 cm hiatal hernia.  Laboratory Studies  Reviewed those in epic and care everywhere  Imaging Studies  March 2024 CT abdomen pelvis without contrast IMPRESSION: 1. No acute findings in the abdomen or pelvis. 2. Tiny nonobstructing bilateral nephrolithiasis. 3. Cholelithiasis without evidence of cholecystitis. 4. Hepatic steatosis. 5.  L5 spondylolysis without spondylolisthesis   ASSESSMENT  Ms. Labus is a 72 y.o. 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  The patient is seen today for evaluation and management of:  1. Autoimmune pancreatitis   2. IgG4 related disease   3. Immunosuppression due to chronic steroid use   4. Diarrhea, unspecified type   5. Unintentional weight loss     The patient is hemodynamically stable.  Clinically, from a GI standpoint she seems to be stable in regards to her autoimmune pancreatitis.  With this being said, some of her symptoms that she had with her recurring low-grade fevers symptoms are not clearly defined if they were azathioprine/6-MP related, but the patient feels uncomfortable but continuing that was the only new medication she had been on (though she had been on it for few months before all of this initiated).  I am not convinced but it is reasonable to go ahead and continue to hold that for now.  She will continue on her current steroid therapy.  I would like to see what her inflammatory markers and IgG4 related levels are.  At some point we may want to consider having her evaluated by Dr. Normajean Glasgow at [facility] to help consider potential role of rituximab in the future for her though she seems to be relatively stable on her 6 mg of budesonide.  I have been able to review her records and she will be due for colon cancer screening later this year we will talk about that future.  Will discuss with her if she has any recurrent symptoms of dysphagia that may require [place] to perform an upper endoscopy at the same time as well.  Will see how her stools look in the coming weeks but I think a SIBO breath test and repeat C. difficile makes sense just to make sure we are not missing anything like that.  I suspect that things will continue to improve as she is further away from her antibiotic therapy that she received during her hospitalization.  All patient questions were answered to the best of my ability, and the patient agrees to the aforementioned plan of action with follow-up as indicated.   PLAN  Laboratories as outlined below We will hold restarting of the azathioprine as she is already stopped it For now continue budesonide 6 mg daily Consider role of Rituximab in future Repeat MRI/MRCP in 6/24 (yearly follow-up for her in the setting of her  significant disease) Colonoscopy for colon cancer screening at some point in 2024 SIBO breath testing to be performed   Orders Placed This Encounter  Procedures   Clostridium difficile Toxin B, Qualitative, Real-Time PCR   Calprotectin, Fecal   CBC   Amylase   Lipase   Sedimentation rate   C-reactive protein   CK (Creatine Kinase)   IgG 4    Modified Medications   No medications on file    Planned Follow Up No follow-ups on file.   Total Time in Face-to-Face and in Coordination of Care for patient including independent/personal interpretation/review of prior testing, medical history, examination, medication adjustment, communicating results with the patient directly, and documentation within the EHR is 25 minutes.   Corliss Parish, MD [facility] Office # [phone]

## 2022-09-22 ENCOUNTER — Other Ambulatory Visit: Payer: Self-pay

## 2022-09-22 LAB — CALPROTECTIN, FECAL: Calprotectin, Fecal: 233 ug/g — ABNORMAL HIGH (ref 0–120)

## 2022-09-22 MED ORDER — FIDAXOMICIN 200 MG PO TABS: 200.0000 mg | ORAL_TABLET | Freq: Two times a day (BID) | ORAL | 0 refills | Status: AC

## 2022-10-11 ENCOUNTER — Encounter: Payer: Self-pay | Admitting: Gastroenterology

## 2022-10-15 ENCOUNTER — Other Ambulatory Visit: Payer: Self-pay | Admitting: Gastroenterology

## 2022-10-20 ENCOUNTER — Other Ambulatory Visit: Payer: Medicare Other

## 2022-10-20 ENCOUNTER — Other Ambulatory Visit: Payer: Self-pay

## 2022-10-20 DIAGNOSIS — R197 Diarrhea, unspecified: Secondary | ICD-10-CM

## 2022-10-25 ENCOUNTER — Telehealth: Payer: Self-pay | Admitting: Neurology

## 2022-10-25 ENCOUNTER — Other Ambulatory Visit: Payer: Self-pay | Admitting: Family Medicine

## 2022-10-25 NOTE — Telephone Encounter (Signed)
Patient would like to see dr.jaffe earlier than sept 10th if possible. She would like to discuss vertigo. She has been seeing an ENT but they said central, nothing ENT related, that she needs to see her neurologist.  She would also like to speak with jaffe about gabapentin. She is going out of town tomorrow, if you can't call today, call her cell [phone]

## 2022-10-25 NOTE — Telephone Encounter (Signed)
Made appt June 19th.

## 2022-11-12 ENCOUNTER — Ambulatory Visit (INDEPENDENT_AMBULATORY_CARE_PROVIDER_SITE_OTHER): Payer: Medicare Other | Admitting: Internal Medicine

## 2022-11-12 ENCOUNTER — Encounter: Payer: Self-pay | Admitting: Internal Medicine

## 2022-11-12 VITALS — BP 122/80 | HR 65 | Ht 60.5 in | Wt 132.0 lb

## 2022-11-12 DIAGNOSIS — Z794 Long term (current) use of insulin: Secondary | ICD-10-CM

## 2022-11-12 DIAGNOSIS — E1165 Type 2 diabetes mellitus with hyperglycemia: Secondary | ICD-10-CM | POA: Diagnosis not present

## 2022-11-12 LAB — POCT GLYCOSYLATED HEMOGLOBIN (HGB A1C): Hemoglobin A1C: 7.4 % — AB (ref 4.0–5.6)

## 2022-11-12 MED ORDER — INSULIN PEN NEEDLE 32G X 4 MM MISC: 1.0000 | Freq: Every day | 3 refills | Status: DC

## 2022-11-12 MED ORDER — REPAGLINIDE 0.5 MG PO TABS: ORAL_TABLET | ORAL | 3 refills | Status: DC

## 2022-11-12 MED ORDER — LANTUS SOLOSTAR 100 UNIT/ML ~~LOC~~ SOPN: 14.0000 [IU] | PEN_INJECTOR | Freq: Every day | SUBCUTANEOUS | 3 refills | Status: DC

## 2022-11-12 NOTE — Progress Notes (Signed)
Name: Arisbet Deterding  MRN/ DOB: [id number], 11/01/1950   Age/ Sex: 72 y.o., female    PCP: Excell Seltzer, MD   Reason for Endocrinology Evaluation: Type 2 Diabetes Mellitus     Date of Initial Endocrinology Visit: 07/10/2021    PATIENT IDENTIFIER: Ms. Amaal Despres is a 73 y.o. female with a past medical history of DM, renal stones, Autoimmune pancreatitis . The patient presented for initial endocrinology clinic visit on 07/10/2021 for consultative assistance with her diabetes management.    HPI: Ms. Shiflett was    Diagnosed with DM 2012 Prior Medications tried/Intolerance: Metformin- GI side effects .  Currently checking blood sugars 2 x / day Hemoglobin A1c has ranged from 6.7% in 2019, peaking at 9.5% in 2022.    Has limited diet due to migraine headaches she has to eat every 3 hours otherwise her headaches will worsen  She is on long-term glucocorticoids for autoimmune pancreatitis for a long term  started  on 08/2017   On her initial visit to our clinic she had an A1c of 9.5%, she is intolerant to Jardiance due to recurrent UTIs.  We started her on repaglinide  She was also diagnosed with MNG a few years ago with last ultrasound 04/2020, thyroid nodules did not meet criteria for further follow-up.  ADRENAL HISTORY: The patient has been on budesonide for many years due to autoimmune pancreatitis.  She presented to the ED in March, 2024 with sepsis secondary to UTI and cellulitis and was noted to have a low serum cortisol which prompted cosyntropin stimulation test, and she was discharged on hydrocortisone  Her cosyntropin stimulation test  result is INVALID , due to chronic corticosteroid intake.  Patient does not need any additional glucocorticoids as she is already on budesonide   SUBJECTIVE:   During the last visit (09/01/2022): A1c 8.4%     Today (11/12/22): Ms. Coolidge is here for follow-up on diabetes management.  She had a recent ED visit for  hypoglycemia.   She checks her blood sugars multiple times daily, through freestyle libre . The patient has had hypoglycemic episodes since the last clinic visit.   She continues to follow-up with GI for autoimmune pancreatitis She has chronic nausea  due to migraine and pancreatitis She continues to follow-up with [facility]  REGIMEN: Lantus 18 units daily  Repaglinide 0.5 mg, 2 tabs before lunch     Statin: INtolerant to crestor  ACE-I/ARB: yes Prior Diabetic Education: no    CONTINUOUS GLUCOSE MONITORING RECORD INTERPRETATION    Dates of Recording: 5/25 - 11/12/2022  Sensor description: Dexcom  Results statistics:   CGM use % of time 93  Average and SD 192/57  Time in range    47    %  % Time Above 180 36  % Time above 250 17  % Time Below target 0   Glycemic patterns summary: BG's are optimal overnight and fluctuate during the day  Hyperglycemic episodes postprandial  Hypoglycemic episodes occurred N/A  Overnight periods: Optimal       DIABETIC COMPLICATIONS: Microvascular complications:  CKDIII Denies: Neuropathy, retinopathy Last eye exam: Completed   Macrovascular complications:   Denies: CAD, PVD, CVA   PAST HISTORY: Past Medical History:  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HCC)    Clotting disorder (HCC)    Complication of anesthesia  difficult waking   Diabetes mellitus without complication (HCC)    Diet and exercise controlled   Diverticulosis    DVT (deep venous thrombosis) (HCC) 02/2019   3calf 2 right lung 1 left lung   DVT (deep venous thrombosis) (HCC) 2020   right leg   Dyspnea    GERD (gastroesophageal reflux disease)    Headache    Hepatic steatosis    Hiatal hernia    History of kidney stones    Hyperlipidemia    Hypertension    06/29/22 has not had to take blood pressure meds in over 1 year.    MVP (mitral valve prolapse)    history of   PONV (postoperative nausea and vomiting)    Pulmonary emboli (HCC) 03/01/2019   2 in the right; 1 in the left   Past Surgical History:  Past Surgical History:  Procedure Laterality Date   arm surgery  2012   left forearm fracture w/ plates pins screws   ARTERY BIOPSY Right 12/03/2021   Procedure: BIOPSY TEMPORAL ARTERY;  Surgeon: Annice Needy, MD;  Location: [facility] ORS;  Service: Vascular;  Laterality: Right;  Right temple   BREAST BIOPSY Left    BREAST SURGERY  2000   breast biopsy (benign)   CATARACT EXTRACTION Right 2005   CATARACT EXTRACTION Left 2010   LITHOTRIPSY  08/12/2008   stent placed bilaterally   PANCREAS BIOPSY  2021   PANCREAS BIOPSY  2019   PARTIAL HYSTERECTOMY     Both ovaries remain, vaginal, for mennorhagia   REPAIR EXTENSOR TENDON Right 06/30/2022   Procedure: Middle and ring finger sagittal band reconstruction;  Surgeon: Marlyne Beards, MD;  Location: [facility] OR;  Service: Orthopedics;  Laterality: Right;   TONSILLECTOMY     TUBAL LIGATION  1980    Social History:  reports that she quit smoking about 31 years ago. Her smoking use included cigarettes. She has a 13.00 pack-year smoking history. She has never been exposed to tobacco smoke. She has never used smokeless tobacco. She reports that she does not drink alcohol and does not use drugs. Family History:  Family History  Problem Relation Age of Onset   Cancer Mother        bone   Hypertension Father    Mitral valve prolapse Father    Asthma Brother    Arthritis Brother    Nephrolithiasis Brother    Asthma Brother    COPD Brother    Breast cancer Maternal Aunt    Breast cancer Maternal Aunt    Colon cancer Neg Hx    Esophageal cancer Neg Hx    Inflammatory bowel disease Neg Hx    Liver disease Neg Hx    Pancreatic cancer Neg Hx    Rectal cancer Neg Hx    Stomach cancer Neg Hx      HOME MEDICATIONS: Allergies as of 11/12/2022       Reactions    Sudafed [pseudoephedrine Hcl] Hives, Itching, Anxiety   Atenolol Other (See Comments)   Migraine   Codeine Itching, Swelling   REACTION: Migraine   Epinephrine Hives   Hydrocodone Other (See Comments)   Migraine   Ibuprofen Other (See Comments)   Migraine   Jardiance [empagliflozin] Other (See Comments)   Caused uti Yeast infection   Metronidazole Swelling   Metronidazole And Related Swelling   Oxycodone Other (See Comments)   Migraine   Penicillins Itching, Swelling   ** Tolerates cephalosporins Has patient had a PCN reaction causing  immediate rash, facial/tongue/throat swelling, SOB or lightheadedness with hypotension: No Has patient had a PCN reaction causing severe rash involving mucus membranes or skin necrosis: No Has patient had a PCN reaction that required hospitalization: No Has patient had a PCN reaction occurring within the last 10 years: No   Pentazocine Lactate    REACTION: Swelling, itching, rash   Promethazine Other (See Comments)   migraine   Propranolol Other (See Comments)   Migraine   Talwin [pentazocine] Other (See Comments)   Swelling and itching    Tramadol Other (See Comments)   Migraine   Vancomycin Swelling   Crestor [rosuvastatin] Other (See Comments)   Muscle pain        Medication List        Accurate as of November 12, 2022 10:09 AM. If you have any questions, ask your nurse or doctor.          aluminum-magnesium hydroxide 200-200 MG/5ML suspension Take by mouth every 6 (six) hours as needed for indigestion. As needed   atorvastatin 80 MG tablet Commonly known as: LIPITOR TAKE 1 TABLET BY MOUTH DAILY   budesonide 3 MG 24 hr capsule Commonly known as: ENTOCORT EC Take 2 capsules (6 mg total) by mouth daily.   clotrimazole-betamethasone cream Commonly known as: LOTRISONE Apply 1 Application topically 2 (two) times daily. Do not use longer than 2 weeks in 1 location.   colesevelam 625 MG tablet Commonly known as: WELCHOL TAKE 3  TABLETS BY MOUTH 2 TIMES DAILY WITH A MEAL.   cyanocobalamin 1000 MCG tablet Commonly known as: VITAMIN B12 Take 1,000 mcg by mouth daily with breakfast.   Dexcom G7 Sensor Misc 1 Device by Does not apply route as directed.   Eliquis 2.5 MG Tabs tablet Generic drug: apixaban TAKE 1 TABLET BY MOUTH 2 TIMES DAILY.   ferrous gluconate 324 MG tablet Commonly known as: FERGON TAKE 1 TABLET (324 MG TOTAL) BY MOUTH DAILY WITH BREAKFAST.   gabapentin 100 MG capsule Commonly known as: NEURONTIN TAKE 1 CAPSULE BY MOUTH UP TO 3 TIMES DAILY AS NEEDED.   gabapentin 300 MG capsule Commonly known as: NEURONTIN TAKE 2 CAPSULES BY MOUTH AT BEDTIME.   Lantus SoloStar 100 UNIT/ML Solostar Pen Generic drug: insulin glargine Inject 18 Units into the skin daily.   omeprazole 20 MG capsule Commonly known as: PRILOSEC Take 20 mg by mouth daily with supper.   ondansetron 4 MG tablet Commonly known as: Zofran Take 1 tablet (4 mg total) by mouth every 8 (eight) hours as needed for nausea or vomiting.   OneTouch Delica Plus Lancet30G Misc USE TO CHECK BLOOD SUGAR TWO TIMES DAILY   OneTouch Verio Reflect w/Device Kit TEST TWICE A DAY   OneTouch Verio test strip Generic drug: glucose blood USE TO CHECK BLOOD SUGAR TWO TIMES A DAY   potassium chloride SA 20 MEQ tablet Commonly known as: KLOR-CON M TAKE 1 TABLET BY MOUTH DAILY.   repaglinide 0.5 MG tablet Commonly known as: PRANDIN Take 2 tablets (1 mg total) by mouth daily before lunch. before breakfast and lunch What changed: additional instructions   Ubrelvy 100 MG Tabs Generic drug: Ubrogepant TAKE 1 TABLET BY MOUTH AS NEEDED (MAY REPEAT DOSE AFTER 2 HOURS IF NEEDED. MAXIMUM 2 TABLETS IN 24 HOURS).   Vitamin D (Ergocalciferol) 1.25 MG (50000 UNIT) Caps capsule Commonly known as: DRISDOL TAKE 1 CAPSULE BY MOUTH ONCE A WEEK.   Vyepti 100 MG/ML injection Generic drug: Eptinezumab-jjmr Inject 300 mg into the  vein every 3 (three)  months.         ALLERGIES: Allergies  Allergen Reactions   Sudafed [Pseudoephedrine Hcl] Hives, Itching and Anxiety   Atenolol Other (See Comments)    Migraine   Codeine Itching and Swelling    REACTION: Migraine   Epinephrine Hives   Hydrocodone Other (See Comments)    Migraine   Ibuprofen Other (See Comments)    Migraine   Jardiance [Empagliflozin] Other (See Comments)    Caused uti Yeast infection   Metronidazole Swelling   Metronidazole And Related Swelling   Oxycodone Other (See Comments)    Migraine   Penicillins Itching and Swelling    ** Tolerates cephalosporins Has patient had a PCN reaction causing immediate rash, facial/tongue/throat swelling, SOB or lightheadedness with hypotension: No Has patient had a PCN reaction causing severe rash involving mucus membranes or skin necrosis: No Has patient had a PCN reaction that required hospitalization: No Has patient had a PCN reaction occurring within the last 10 years: No     Pentazocine Lactate     REACTION: Swelling, itching, rash   Promethazine Other (See Comments)    migraine   Propranolol Other (See Comments)    Migraine   Talwin [Pentazocine] Other (See Comments)    Swelling and itching    Tramadol Other (See Comments)    Migraine   Vancomycin Swelling   Crestor [Rosuvastatin] Other (See Comments)    Muscle pain     REVIEW OF SYSTEMS: A comprehensive ROS was conducted with the patient and is negative except as per HPI    OBJECTIVE:   VITAL SIGNS: BP 122/80 (BP Location: Right Arm, Patient Position: Sitting, Cuff Size: Normal)   Pulse 65   Ht 5' 0.5" (1.537 m)   Wt 132 lb (59.9 kg)   SpO2 99%   BMI 25.36 kg/m    PHYSICAL EXAM:  General: Pt appears well and is in NAD  Neck: General: Supple without adenopathy or carotid bruits. Thyroid: Thyroid size normal.  No goiter or nodules appreciated.   Lungs: Clear with good BS bilat   Heart: RRR   Extremities:  Lower extremities - No pretibial  edema.  Neuro: MS is good with appropriate affect, pt is alert and Ox3    DM foot exam: 11/12/2022  The skin of the feet is intact without sores or ulcerations. The pedal pulses are 2+ on right and 2+ on left. The sensation is intact to a screening 5.07, 10 gram monofilament bilaterally   DATA REVIEWED:  Lab Results  Component Value Date   HGBA1C 8.4 (H) 08/16/2022   HGBA1C 7.1 (A) 05/11/2022   HGBA1C 8.0 (A) 02/18/2022     Latest Reference Range & Units 09/02/22 15:55  Sodium 135 - 146 mmol/L 141  Potassium 3.5 - 5.3 mmol/L 3.8  Chloride 98 - 110 mmol/L 102  CO2 20 - 32 mmol/L 28  Glucose 65 - 99 mg/dL 621 (H)  BUN 7 - 25 mg/dL 10  Creatinine 3.08 - 6.57 mg/dL 8.46  Calcium 8.6 - 96.2 mg/dL 9.3  BUN/Creatinine Ratio 6 - 22 (calc) SEE NOTE:  AG Ratio 1.0 - 2.5 (calc) 1.1  AST 10 - 35 U/L 41 (H)  ALT 6 - 29 U/L 23  Total Protein 6.1 - 8.1 g/dL 6.2  Total Bilirubin 0.2 - 1.2 mg/dL 0.6  (H): Data is abnormally high   Thyroid ULtrasound 04/2020   Estimated total number of nodules >/= 1 cm: 1   Number of  spongiform nodules >/=  2 cm not described below (TR1): 0   Number of mixed cystic and solid nodules >/= 1.5 cm not described below (TR2): 0   _________________________________________________________   Nodule # 4: The previously identified TI-RADS category 4 nodule in the right inferior gland is somewhat elongated and dumbbell-shaped. On today's study, slightly higher resolution demonstrates that this actually represents 2 smaller subcentimeter nodules which are adjacent and abutting. Measured independently, neither nodule measures over 1 cm and therefore does not meet criteria for further imaging surveillance. Of note, the nodular conglomerate demonstrates no interval change in size or appearance compared to the prior imaging from October 2020.   Incidental note again made of multiple cystic and minimally complex cystic/solid nodules scattered throughout the  thyroid. None of these meet criteria for further evaluation.   IMPRESSION: The previously identified TI-RADS category 4 nodule in the right inferior gland is identified on the present examination as 2 smaller adjacent and abutting nodules. While the overall conglomerate demonstrates no change compared to the prior study, the fact that this is 2 separate smaller nodules results in a down grade of the TI-RADS classification system. The nodules do not meet size criteria to recommend further evaluation. No further follow-up required.    ASSESSMENT / PLAN / RECOMMENDATIONS:   1) Type 2 Diabetes Mellitus, Sub- optimally  controlled, With fluctuating GFR  - Most recent A1c of 7.4 %. Goal A1c < 7.0 %.    -I have praised the patient improved glycemic control, A1c down from 8.4% to 7.4% -Patient is not able to follow a low-carb diet due to autoimmune pancreatitis, chronic GI symptoms, and migraine headaches which limits her dietary options.  Our goal is to prevent hypoglycemia and severe hyperglycemia - Pt with hx of pancreatitis hence GLP-1 agonist and DPP 4 inhibitors as well as Tirzepatide are all contraindicated - Intolerant to Jardiance due to recurrent genital infections  -In reviewing her CGM data, the patient has been noted with postprandial hyperglycemia, I did notice that her BG's seen the highest after supper, I have recommended adding 1 tablet of repaglinide before supper, continuing to take repaglinide 2 tablets before lunch and decreasing Lantus as below to prevent hypoglycemia   MEDICATIONS: Decrease Lantus 14 units daily Change Repaglinidie 0.5 mg, 2 tablets Before Lunch , 1 tablet before supper  EDUCATION / INSTRUCTIONS: BG monitoring instructions: Patient is instructed to check her blood sugars 1 times a day, fasting. Call [facility] if: BG persistently < 70  I reviewed the Rule of 15 for the treatment of hypoglycemia in detail with the patient. Literature  supplied.   2) Diabetic complications:  Eye: Does not have known diabetic retinopathy.  Neuro/ Feet: Does not have known diabetic peripheral neuropathy. Renal: Patient does  have known baseline CKD. She is  on an ACEI/ARB at present.     Follow-up in 6 months      Signed electronically by: Lyndle Herrlich, MD  [facility]  [facility] [address]ure[address] [place], [place] [address] Phone: [phone] FAX: [phone]   CC: Excell Seltzer, MD [address] [place] [address] Phone: [phone]  Fax: [phone]    Return to Endocrinology clinic as below: Future Appointments  Date Time Provider Department Center  11/12/2022 10:10 AM Raequon Catanzaro, Konrad Dolores, MD [facility] None  11/24/2022  9:30 AM Drema Dallas, DO [facility] None  02/15/2023  9:50 AM Drema Dallas, DO [facility] None

## 2022-11-12 NOTE — Patient Instructions (Signed)
-   Decrease  Lantus 14 units daily  - Change  Repaglinide 0.5 mg , 2 tablets before Lunch  and 1 tablet before Supper     HOW TO TREAT LOW BLOOD SUGARS (Blood sugar LESS THAN 70 MG/DL) Please follow the RULE OF 15 for the treatment of hypoglycemia treatment (when your (blood sugars are less than 70 mg/dL)   STEP 1: Take 15 grams of carbohydrates when your blood sugar is low, which includes:  3-4 GLUCOSE TABS  OR 3-4 OZ OF JUICE OR REGULAR SODA OR ONE TUBE OF GLUCOSE GEL    STEP 2: RECHECK blood sugar in 15 MINUTES STEP 3: If your blood sugar is still low at the 15 minute recheck --> then, go back to STEP 1 and treat AGAIN with another 15 grams of carbohydrates.

## 2022-11-15 ENCOUNTER — Ambulatory Visit: Payer: Medicare Other

## 2022-11-15 ENCOUNTER — Encounter: Payer: Self-pay | Admitting: Internal Medicine

## 2022-11-15 DIAGNOSIS — R197 Diarrhea, unspecified: Secondary | ICD-10-CM

## 2022-11-19 ENCOUNTER — Encounter: Payer: Self-pay | Admitting: Gastroenterology

## 2022-11-19 NOTE — Telephone Encounter (Signed)
Is she ok for the [facility]? 

## 2022-11-19 NOTE — Telephone Encounter (Signed)
Debra Leach, Happy to offer the patient EGD and that she is due for colon cancer screening we can do a colonoscopy as well. If she just wants the endoscopy that is okay too, but she looks to be due for both. Thanks. GM

## 2022-11-19 NOTE — Telephone Encounter (Signed)
Dr Meridee Score please advise. I see the message below from the last office note.      "I have been able to review her records and she will be due for colon cancer screening later this year we will talk about that future. Will discuss with her if she has any recurrent symptoms of dysphagia that may require [place] to perform an upper endoscopy at the same time as well."

## 2022-11-23 LAB — CALPROTECTIN, FECAL: Calprotectin, Fecal: 192 ug/g — ABNORMAL HIGH (ref 0–120)

## 2022-11-24 ENCOUNTER — Ambulatory Visit: Payer: Medicare Other | Admitting: Neurology

## 2022-11-24 ENCOUNTER — Other Ambulatory Visit: Payer: Self-pay

## 2022-11-24 DIAGNOSIS — R197 Diarrhea, unspecified: Secondary | ICD-10-CM

## 2022-11-24 DIAGNOSIS — Z8619 Personal history of other infectious and parasitic diseases: Secondary | ICD-10-CM

## 2022-11-25 ENCOUNTER — Other Ambulatory Visit: Payer: Medicare Other

## 2022-11-25 DIAGNOSIS — R197 Diarrhea, unspecified: Secondary | ICD-10-CM

## 2022-11-25 DIAGNOSIS — Z8619 Personal history of other infectious and parasitic diseases: Secondary | ICD-10-CM

## 2022-11-26 ENCOUNTER — Encounter: Payer: Self-pay | Admitting: Gastroenterology

## 2022-11-26 LAB — CLOSTRIDIUM DIFFICILE BY PCR: Toxigenic C. Difficile by PCR: NEGATIVE

## 2022-12-02 NOTE — Progress Notes (Signed)
In   NEUROLOGY FOLLOW UP OFFICE NOTE  Debra Leach [id number]  Assessment/Plan:   Episodic vertigo.  Semiology suspicious for BPPV but vestibular testing suggestive of central etiology.  Unclear etiology but may be vestibular migraine Chronic migraine without aura, without status migrainosus, not intractable - worse over past 6 months related to other health problems but still overall improved compared to prior to starting Vyepti Ocular migraine     She is going on a trip to [place] in September.  Will provide her meclizine to take in case she has a flare up.  Migraine prevention:  Vyepti 300mg  Q12wks.   gabapentin 600mg  at bedtime Migraine rescue:  Ubrelvy 100mg ; gabapentin 100mg  for less severe Limit use of pain relievers to no more than 2 days out of week to prevent risk of rebound or medication-overuse headache. Keep headache diary Follow up on 9/10 as scheduled.     Subjective:  Debra Leach is a 72 year old right-handed female with autoimmune pancreatitis, migraines, hyperlipidemia, diabetes, GERD, osteoarthritis, and history of MVP and renal stones who follows up for dizziness.     UPDATE: Two days ago, she was at the dentist and when she leaned back in the chair, she had a recurrence of vertigo.  Lasted all day and went to bed.  The next day, spinning resolved but still felt lightheaded.  Doing okay now.  She does do the home vestibular exercises which helps.  She is concerned about a recurrence when she goes on her [place] trip in September.      Rescue protocol:  For moderate - gabapentin 100mg , for severeBernita Raisin Current NSAIDS/steroid: budesomide Current analgesics: None Current triptans: None Current ergotamine: None Current anti-emetic: Promethazine 12.5 mg Current muscle relaxants: None Current anti-anxiolytic: None Current sleep aide: None Current Antihypertensive medications: losartan-HCTZ Current Antidepressant medications: None Current  Anticonvulsant medications: Gabapentin 600mg  at bedtime Current anti-CGRP: Vyepti 300mg ; Ubrelvy Current Vitamins/Herbal/Supplements: Riboflavin 400 mg daily, vitamin D Current Antihistamines/Decongestants: meclizine Other therapy: Cefaly, ice pack Other medication: Meclizine   Caffeine: 1 cup of coffee daily Alcohol: None Smoker: No Diet: Follows LEAP ImmunoCalm Dietary Program.  Hydrates. Exercise: When tolerated (knee problems).  She walks and bikes. Depression: No; Anxiety: No Other pain:  no Sleep hygiene: Improved.  Sleeps better now than every before. 100mg  during day as needed   HISTORY: Migraines: Onset: 72 years old Location:  Varies (unilateral either side, frontal-temporal, back of head) Quality:  Varies (stabbing, pounding, throbbing) Initial Intensity:  10/10 severe, otherwise 5-7/10 Aura:  Occurs off and on over the years and varies in semiology.  In early 1970s, black out.  In late 1980s, white out.  In late 1990s, flashing lights and zigzag lines.  Since 2016, scintillating scotoma (occurs 1 to 2 times a month) Prodrome:  no Associated symptoms: Initially vomiting.  Sometimes nausea.  Photophobia, phonophobia.  Dizziness, speech difficulty. Initial Duration:  All day Initial Frequency:  daily Triggers: Change in weather, emotional stress, valsalva maneuver, odors, certain foods Relieving factors: ice, rest Activity:  Difficult to function if severe  She has been to the ED for migraines with speech disturbance.  Workup in March 2024 included MRI of brain without contrast and CTA head and neck which were unremarkable.     Past NSAIDS:  diclofenac 50mg , Cambia, Mobic 15mg , ibuprofen, naproxen, toradol Past analgesics:  tramadol (reaction), Midrin, Excedrin Past abortive triptans/ergots:  Treximet, Axert, Amerge, Frova, Maxalt, Relpax, Zomig tablet, DHE NS, sumatriptan 100mg ; sumatriptan 6mg  Cutler Past anti-emetic:  Zofran 4mg   Past anxiolytic:  Buspirone,  clonazepam Past antihypertensive medications:  Metoprolol, Norvasc, propranolol ER 120mg , atenolol 100mg  (side effects dizziness, myalgias, acid reflux) Past antidepressant medications:  Venlafaxine, sertraline, amitriptyline, amoxapine, duloxetine, nortriptyline, imipramine, Luvox, Remeron, Serzone, bupropion, desipramine, doxepin, Vivactil Past anticonvulsant medications:  Depakote, Topamax, zonisamide, Lamictal, Keppra, Lyrica, gabapentin 300/300/600  Past anti--CGRP: Aimovig, Emgality Past vitamins/Herbal/Supplements:  butterbur, Mg, CoQ10 Other past treatments:  Botox, Cefaly Device, methylergonovine, acupuncture, biofeedback, Seroquel, Thorazine, methylsergide maleate   Family history of headache:  Maybe her mother's side.   Prior brain MRI normal (date unknown but report is mentioned in prior notes).   DIzziness: Began having dizziness in early 2022.  No preceding head injury or new medication.  Reports episodic dizziness described as a wave of lightheadedness or sensation of stepping off of a boat, but not spinning.  It occurs with any movement but also can occur spontaneously.  Typically lasts 15 seconds but occurs all day long.  It doesn't occur while laying in bed, such as turning over.  No headache but feels like her head is floating.  No visual disturbance.  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  It does not appear to correlate with a migraine flare.  She reports history of mild orthostatic hypotension when getting up too fast, but not experiencing that with this and reportedly orthostatic vitals were negative when checked by her PCP.  Flonase didn't help, however meclizine is effective.  She followed up with ENT.  Dix-Hallpike was negative.  VNG showed abnormal smooth pursuit, saccades and optokinetic testing - central pathology cannot be ruled out.  She has bilateral sensorineural hearing loss.   Hearing aids were recommended.  Underwent workup for possible central dizziness.  MRI and MRA of brain on 12/23/2020 were unremarkable.  Vestibular rehab effective.   Abnormal Movements: In December 2017, we started atenolol for migraine prevention.  It worked well, but due to reported side effects of dizziness, acid reflux and joint and muscle pain, she was tapered off of it in early April.  Around that time, she developed shaking episodes or tremors associated with her migraines.  She followed up with her PCP, Dr. Ermalene Searing, on 10/08/16 and we had her discontinue the gabapentin.  She hasn't had any recurrent tremors, however she says she has gone up to 4 days at a time without an attack.  Since discontinuing the gabapentin, the intensity of her daily headaches have gotten worse.     I reviewed the video of her habitual attacks.  In the video, she exhibits flapping of her right hand, arrhythmic and without tremor or choreiform movement.  It is not an arrhythmic jerking, consistent with myoclonus.  It sometimes involves the left hand as well.  She denies stress and anxiety.  Episodic Right Monocular Vision Loss: In April 2023, she began having episodes of monocular dimming of vision in the right eye lasting 15 minutes.  No associated headache.  Different than her previous ocular migraines presenting as pixel vision.  Saw her ophthalmologist and eye exam was normal.  Sed rate on 5/12 was 102 and repeat on 5/17 was 130.  She was hospitalized on 11/02/2021 for syncope in setting of AKI and flu.  She was placed on 5 day course of prednsione.  Sed rate on day of admission was 60.  Sed rate in May 2023 was over 130.  Carotid ultrasound on 6/15 was negative for hemodynamically significant  stenosis.  Underwent temporal artery biopsy on 6/29 which was negative.  Repeat eye exam on 12/09/2021 was normal.  Possible ocular migraine?  PAST MEDICAL HISTORY: Past Medical History:  Diagnosis Date   Anemia    Arthritis     osteoarthritis B knees, left hip , right elbow   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HCC)    Clotting disorder (HCC)    Complication of anesthesia    difficult waking   Diabetes mellitus without complication (HCC)    Diet and exercise controlled   Diverticulosis    DVT (deep venous thrombosis) (HCC) 02/2019   3calf 2 right lung 1 left lung   DVT (deep venous thrombosis) (HCC) 2020   right leg   Dyspnea    GERD (gastroesophageal reflux disease)    Headache    Hepatic steatosis    Hiatal hernia    History of kidney stones    Hyperlipidemia    Hypertension    06/29/22 has not had to take blood pressure meds in over 1 year.   MVP (mitral valve prolapse)    history of   PONV (postoperative nausea and vomiting)    Pulmonary emboli (HCC) 03/01/2019   2 in the right; 1 in the left    MEDICATIONS: Current Outpatient Medications on File Prior to Visit  Medication Sig Dispense Refill   aluminum-magnesium hydroxide 200-200 MG/5ML suspension Take by mouth every 6 (six) hours as needed for indigestion. As needed     atorvastatin (LIPITOR) 80 MG tablet TAKE 1 TABLET BY MOUTH DAILY 90 tablet 3   Blood Glucose Monitoring Suppl (ONETOUCH VERIO REFLECT) w/Device KIT TEST TWICE A DAY 1 kit 0   budesonide (ENTOCORT EC) 3 MG 24 hr capsule Take 2 capsules (6 mg total) by mouth daily. 60 capsule 12   clotrimazole-betamethasone (LOTRISONE) cream Apply 1 Application topically 2 (two) times daily. Do not use longer than 2 weeks in 1 location. 15 g 0   colesevelam (WELCHOL) 625 MG tablet TAKE 3 TABLETS BY MOUTH 2 TIMES DAILY WITH A MEAL. 540 tablet 3   Continuous Blood Gluc Sensor (DEXCOM G7 SENSOR) MISC 1 Device by Does not apply route as directed. 9 each 3   ELIQUIS 2.5 MG TABS tablet TAKE 1 TABLET BY MOUTH 2 TIMES DAILY. 180 tablet 0   Eptinezumab-jjmr (VYEPTI) 100 MG/ML injection Inject 300 mg into the vein every 3 (three) months.     ferrous gluconate (FERGON) 324 MG tablet TAKE 1 TABLET (324 MG TOTAL) BY MOUTH  DAILY WITH BREAKFAST. 60 tablet 0   gabapentin (NEURONTIN) 100 MG capsule TAKE 1 CAPSULE BY MOUTH UP TO 3 TIMES DAILY AS NEEDED. 270 capsule 3   gabapentin (NEURONTIN) 300 MG capsule TAKE 2 CAPSULES BY MOUTH AT BEDTIME. 180 capsule 1   insulin glargine (LANTUS SOLOSTAR) 100 UNIT/ML Solostar Pen Inject 14 Units into the skin daily. 15 mL 3   Insulin Pen Needle 32G X 4 MM MISC 1 Device by Does not apply route daily in the afternoon. 100 each 3   Lancets (ONETOUCH DELICA PLUS LANCET30G) MISC USE TO CHECK BLOOD SUGAR TWO TIMES DAILY 200 each 3   omeprazole (PRILOSEC) 20 MG capsule Take 20 mg by mouth daily with supper.     ondansetron (ZOFRAN) 4 MG tablet Take 1 tablet (4 mg total) by mouth every 8 (eight) hours as needed for nausea or vomiting. 20 tablet 6   ONETOUCH VERIO test strip USE TO CHECK BLOOD SUGAR TWO TIMES A DAY 200 strip 3  potassium chloride SA (KLOR-CON M) 20 MEQ tablet TAKE 1 TABLET BY MOUTH DAILY. 90 tablet 3   repaglinide (PRANDIN) 0.5 MG tablet Take 2 tablets (1 mg total) by mouth daily before lunch AND 1 tablet (0.5 mg total) daily before supper. before breakfast and lunch. 270 tablet 3   UBRELVY 100 MG TABS TAKE 1 TABLET BY MOUTH AS NEEDED (MAY REPEAT DOSE AFTER 2 HOURS IF NEEDED. MAXIMUM 2 TABLETS IN 24 HOURS). 10 tablet 3   vitamin B-12 (CYANOCOBALAMIN) 1000 MCG tablet Take 1,000 mcg by mouth daily with breakfast.     Vitamin D, Ergocalciferol, (DRISDOL) 1.25 MG (50000 UNIT) CAPS capsule TAKE 1 CAPSULE BY MOUTH ONCE A WEEK. 12 capsule 3   No current facility-administered medications on file prior to visit.    ALLERGIES: Allergies  Allergen Reactions   Sudafed [Pseudoephedrine Hcl] Hives, Itching and Anxiety   Atenolol Other (See Comments)    Migraine   Codeine Itching and Swelling    REACTION: Migraine   Epinephrine Hives   Hydrocodone Other (See Comments)    Migraine   Ibuprofen Other (See Comments)    Migraine   Jardiance [Empagliflozin] Other (See Comments)     Caused uti Yeast infection   Metronidazole Swelling   Metronidazole And Related Swelling   Oxycodone Other (See Comments)    Migraine   Penicillins Itching and Swelling    ** Tolerates cephalosporins Has patient had a PCN reaction causing immediate rash, facial/tongue/throat swelling, SOB or lightheadedness with hypotension: No Has patient had a PCN reaction causing severe rash involving mucus membranes or skin necrosis: No Has patient had a PCN reaction that required hospitalization: No Has patient had a PCN reaction occurring within the last 10 years: No     Pentazocine Lactate     REACTION: Swelling, itching, rash   Promethazine Other (See Comments)    migraine   Propranolol Other (See Comments)    Migraine   Talwin [Pentazocine] Other (See Comments)    Swelling and itching    Tramadol Other (See Comments)    Migraine   Vancomycin Swelling   Crestor [Rosuvastatin] Other (See Comments)    Muscle pain    FAMILY HISTORY: Family History  Problem Relation Age of Onset   Cancer Mother        bone   Hypertension Father    Mitral valve prolapse Father    Asthma Brother    Arthritis Brother    Nephrolithiasis Brother    Asthma Brother    COPD Brother    Breast cancer Maternal Aunt    Breast cancer Maternal Aunt    Colon cancer Neg Hx    Esophageal cancer Neg Hx    Inflammatory bowel disease Neg Hx    Liver disease Neg Hx    Pancreatic cancer Neg Hx    Rectal cancer Neg Hx    Stomach cancer Neg Hx       Objective:  Blood pressure 102/60, pulse 96, height 5\' 2"  (1.575 m), weight 132 lb (59.9 kg), SpO2 99 %. General: No acute distress.  Patient appears well-groomed.   Head:  Normocephalic/atraumatic Neck:  Supple.  No paraspinal tenderness.  Full range of motion. Heart:  Regular rate and rhythm. Neuro:  Alert and oriented.  Speech fluent and not dysarthric.  Language intact.  CN II-XII intact.  Bulk and tone normal.  Muscle strength 5/5 throughout.  Deep tendon  reflexes 2+ throughout.  Gait normal.  Romberg negative.     Madelaine Bhat  Everlena Cooper, DO  CC: Kerby Nora, MD

## 2022-12-03 ENCOUNTER — Encounter: Payer: Self-pay | Admitting: Neurology

## 2022-12-03 ENCOUNTER — Ambulatory Visit (INDEPENDENT_AMBULATORY_CARE_PROVIDER_SITE_OTHER): Payer: Medicare Other | Admitting: Neurology

## 2022-12-03 VITALS — BP 102/60 | HR 96 | Ht 62.0 in | Wt 132.0 lb

## 2022-12-03 DIAGNOSIS — G43709 Chronic migraine without aura, not intractable, without status migrainosus: Secondary | ICD-10-CM | POA: Diagnosis not present

## 2022-12-03 DIAGNOSIS — R42 Dizziness and giddiness: Secondary | ICD-10-CM | POA: Diagnosis not present

## 2022-12-03 MED ORDER — GABAPENTIN 300 MG PO CAPS: 600.0000 mg | ORAL_CAPSULE | Freq: Every day | ORAL | 1 refills | Status: DC

## 2022-12-03 MED ORDER — UBRELVY 100 MG PO TABS: 1.0000 | ORAL_TABLET | ORAL | 3 refills | Status: DC | PRN

## 2022-12-03 MED ORDER — GABAPENTIN 100 MG PO CAPS: ORAL_CAPSULE | ORAL | 3 refills | Status: DC

## 2022-12-03 MED ORDER — MECLIZINE HCL 25 MG PO TABS: 25.0000 mg | ORAL_TABLET | Freq: Three times a day (TID) | ORAL | 1 refills | Status: DC | PRN

## 2022-12-03 NOTE — Patient Instructions (Signed)
Refilled gabapentin and [employer] script for meclizine Follow up 9/10

## 2022-12-07 ENCOUNTER — Telehealth: Payer: Self-pay

## 2022-12-07 ENCOUNTER — Encounter: Payer: Self-pay | Admitting: Neurology

## 2022-12-07 NOTE — Telephone Encounter (Signed)
PA  needed for Urbelvy.  

## 2022-12-08 ENCOUNTER — Other Ambulatory Visit (HOSPITAL_COMMUNITY): Payer: Self-pay

## 2022-12-08 NOTE — Telephone Encounter (Signed)
No Prior Authorization Needed.  Called and talked to [facility] and had them run it through for a 30 day supply instead of a 5 day supply and they got a paid claim.

## 2022-12-08 NOTE — Telephone Encounter (Signed)
Patient advised.

## 2022-12-15 ENCOUNTER — Encounter: Payer: Self-pay | Admitting: Family Medicine

## 2022-12-15 ENCOUNTER — Encounter: Payer: Self-pay | Admitting: Gastroenterology

## 2022-12-15 ENCOUNTER — Telehealth: Payer: Self-pay

## 2022-12-15 DIAGNOSIS — D509 Iron deficiency anemia, unspecified: Secondary | ICD-10-CM

## 2022-12-15 DIAGNOSIS — E1169 Type 2 diabetes mellitus with other specified complication: Secondary | ICD-10-CM

## 2022-12-15 DIAGNOSIS — Z794 Long term (current) use of insulin: Secondary | ICD-10-CM

## 2022-12-15 DIAGNOSIS — E559 Vitamin D deficiency, unspecified: Secondary | ICD-10-CM

## 2022-12-15 NOTE — Telephone Encounter (Signed)
Debra Leach [birth year] [id number]  Dear Dr. Ermalene Searing:  We have scheduled the above named patient for a(n) endoscopic procedure. Our records show that she is on anticoagulation therapy.  Please advise as to whether the patient may come off their therapy of Eliquis 1 day prior to their procedure which is scheduled for 02/11/23 at 8 am.  Please route your response to Juanda Crumble, RN or fax response to [phone].  Sincerely,   [facility]

## 2022-12-15 NOTE — Telephone Encounter (Signed)
Would you like for patient to hold Eliquis?

## 2022-12-15 NOTE — Telephone Encounter (Signed)
Called and spoke with patient to schedule EGD in [facility]. Pt has been scheduled for telephone PV appt on 12/29/22 at 9 am. Pt states that she is a diabetic and has terrible migraines, requested an early morning appt. EGD scheduled for Friday, 02/11/23 at 8 am. Pt is aware that she will need arrive by 7 am with a care partner. Pt has been advised that we will obtain clearance from her PCP to hold Eliquis for 1 day prior to her procedure. Pt verbalized understanding and had no concerns at the end of the call.  Cardiac clearance request sent to PCP via epic. See alternate telephone encounter.

## 2022-12-15 NOTE — Telephone Encounter (Signed)
Schedule next available EGD slot with me in the [facility] that works with her schedule. GM

## 2022-12-15 NOTE — Telephone Encounter (Signed)
Would get 1 day hold approval. Thanks. GM

## 2022-12-16 LAB — HM DIABETES EYE EXAM

## 2022-12-19 ENCOUNTER — Encounter: Payer: Self-pay | Admitting: Family Medicine

## 2022-12-21 NOTE — Telephone Encounter (Signed)
Labs ordered for [facility]

## 2022-12-27 ENCOUNTER — Telehealth: Payer: Self-pay

## 2022-12-27 DIAGNOSIS — R1319 Other dysphagia: Secondary | ICD-10-CM

## 2022-12-27 NOTE — Telephone Encounter (Signed)
7/24 V appt cancelled since pt has an office visit on 9/4. Pt will receive EGD instructions at the time of her office visit.   Ambulatory referral to GI in epic for EGD .

## 2022-12-29 ENCOUNTER — Other Ambulatory Visit (INDEPENDENT_AMBULATORY_CARE_PROVIDER_SITE_OTHER): Payer: Medicare Other

## 2022-12-29 ENCOUNTER — Telehealth: Payer: Self-pay | Admitting: *Deleted

## 2022-12-29 DIAGNOSIS — E1169 Type 2 diabetes mellitus with other specified complication: Secondary | ICD-10-CM

## 2022-12-29 DIAGNOSIS — E559 Vitamin D deficiency, unspecified: Secondary | ICD-10-CM | POA: Diagnosis not present

## 2022-12-29 DIAGNOSIS — E785 Hyperlipidemia, unspecified: Secondary | ICD-10-CM | POA: Diagnosis not present

## 2022-12-29 DIAGNOSIS — D509 Iron deficiency anemia, unspecified: Secondary | ICD-10-CM

## 2022-12-29 LAB — CBC WITH DIFFERENTIAL/PLATELET
Basophils Absolute: 0.1 10*3/uL (ref 0.0–0.1)
Basophils Relative: 1 % (ref 0.0–3.0)
Eosinophils Absolute: 0.2 10*3/uL (ref 0.0–0.7)
Eosinophils Relative: 2.1 % (ref 0.0–5.0)
HCT: 42.2 % (ref 36.0–46.0)
Hemoglobin: 13.2 g/dL (ref 12.0–15.0)
Lymphocytes Relative: 22 % (ref 12.0–46.0)
Lymphs Abs: 1.8 10*3/uL (ref 0.7–4.0)
MCHC: 31.4 g/dL (ref 30.0–36.0)
MCV: 89.1 fl (ref 78.0–100.0)
Monocytes Absolute: 0.6 10*3/uL (ref 0.1–1.0)
Monocytes Relative: 7.7 % (ref 3.0–12.0)
Neutro Abs: 5.4 10*3/uL (ref 1.4–7.7)
Neutrophils Relative %: 67.2 % (ref 43.0–77.0)
Platelets: 293 10*3/uL (ref 150.0–400.0)
RBC: 4.73 Mil/uL (ref 3.87–5.11)
RDW: 16.5 % — ABNORMAL HIGH (ref 11.5–15.5)
WBC: 8 10*3/uL (ref 4.0–10.5)

## 2022-12-29 LAB — COMPREHENSIVE METABOLIC PANEL
ALT: 18 U/L (ref 0–35)
AST: 15 U/L (ref 0–37)
Albumin: 3.6 g/dL (ref 3.5–5.2)
Alkaline Phosphatase: 62 U/L (ref 39–117)
BUN: 13 mg/dL (ref 6–23)
CO2: 29 mEq/L (ref 19–32)
Calcium: 9.1 mg/dL (ref 8.4–10.5)
Chloride: 107 mEq/L (ref 96–112)
Creatinine, Ser: 0.89 mg/dL (ref 0.40–1.20)
GFR: 65.12 mL/min (ref >60.00)
Glucose, Bld: 110 mg/dL — ABNORMAL HIGH (ref 70–99)
Potassium: 4.2 mEq/L (ref 3.5–5.1)
Sodium: 144 mEq/L (ref 135–145)
Total Bilirubin: 0.5 mg/dL (ref 0.2–1.2)
Total Protein: 6.2 g/dL (ref 6.0–8.3)

## 2022-12-29 LAB — IBC + FERRITIN
Ferritin: 19.1 ng/mL (ref 10.0–291.0)
Iron: 60 ug/dL (ref 42–145)
Saturation Ratios: 19.2 % — ABNORMAL LOW (ref 20.0–50.0)
TIBC: 312.2 ug/dL (ref 250.0–450.0)
Transferrin: 223 mg/dL (ref 212.0–360.0)

## 2022-12-29 LAB — LIPID PANEL
Cholesterol: 260 mg/dL — ABNORMAL HIGH (ref 0–200)
HDL: 57.2 mg/dL (ref >39.00)
NonHDL: 203.05
Total CHOL/HDL Ratio: 5
Triglycerides: 206 mg/dL — ABNORMAL HIGH (ref 0.0–149.0)
VLDL: 41.2 mg/dL — ABNORMAL HIGH (ref 0.0–40.0)

## 2022-12-29 LAB — LDL CHOLESTEROL, DIRECT: Direct LDL: 164 mg/dL

## 2022-12-29 LAB — VITAMIN D 25 HYDROXY (VIT D DEFICIENCY, FRACTURES): VITD: 49.39 ng/mL (ref 30.00–100.00)

## 2022-12-29 NOTE — Progress Notes (Signed)
No critical labs need to be addressed urgently. We will discuss labs in detail at upcoming office visit.   

## 2022-12-29 NOTE — Telephone Encounter (Signed)
I do not work with him in the office. Sharene Skeans Cristi Loron is his CMA in the office.

## 2022-12-29 NOTE — Telephone Encounter (Signed)
TY

## 2022-12-30 ENCOUNTER — Encounter: Payer: Self-pay | Admitting: Internal Medicine

## 2022-12-31 ENCOUNTER — Encounter: Payer: Self-pay | Admitting: Internal Medicine

## 2023-01-05 ENCOUNTER — Encounter: Payer: Medicare Other | Admitting: Family Medicine

## 2023-01-14 ENCOUNTER — Ambulatory Visit: Payer: Medicare Other | Admitting: Family Medicine

## 2023-01-14 ENCOUNTER — Encounter: Payer: Self-pay | Admitting: Family Medicine

## 2023-01-14 VITALS — BP 140/82 | HR 91 | Temp 98.0°F | Ht 61.0 in | Wt 132.2 lb

## 2023-01-14 DIAGNOSIS — E1169 Type 2 diabetes mellitus with other specified complication: Secondary | ICD-10-CM

## 2023-01-14 DIAGNOSIS — M549 Dorsalgia, unspecified: Secondary | ICD-10-CM

## 2023-01-14 DIAGNOSIS — D509 Iron deficiency anemia, unspecified: Secondary | ICD-10-CM

## 2023-01-14 DIAGNOSIS — E1159 Type 2 diabetes mellitus with other circulatory complications: Secondary | ICD-10-CM

## 2023-01-14 DIAGNOSIS — N1831 Chronic kidney disease, stage 3a: Secondary | ICD-10-CM

## 2023-01-14 DIAGNOSIS — E785 Hyperlipidemia, unspecified: Secondary | ICD-10-CM

## 2023-01-14 DIAGNOSIS — Z794 Long term (current) use of insulin: Secondary | ICD-10-CM

## 2023-01-14 DIAGNOSIS — E1122 Type 2 diabetes mellitus with diabetic chronic kidney disease: Secondary | ICD-10-CM

## 2023-01-14 DIAGNOSIS — K861 Other chronic pancreatitis: Secondary | ICD-10-CM

## 2023-01-14 DIAGNOSIS — I152 Hypertension secondary to endocrine disorders: Secondary | ICD-10-CM

## 2023-01-14 MED ORDER — FERROUS GLUCONATE 324 (38 FE) MG PO TABS: 324.0000 mg | ORAL_TABLET | Freq: Every day | ORAL | 1 refills | Status: DC

## 2023-01-14 NOTE — Assessment & Plan Note (Signed)
Chronic Followed by Dr. Meridee Score. Chronic budesonide. She is feeling better altogether.

## 2023-01-14 NOTE — Assessment & Plan Note (Signed)
Chronic, well controlled on no medication 

## 2023-01-14 NOTE — Patient Instructions (Signed)
Continue daily  ferrous sulfate given possible nutritional cause of lower iron.  Will recheck in 6 months at physcial labs.

## 2023-01-14 NOTE — Assessment & Plan Note (Signed)
Chronic, significant improvement with assistance of endocrinology on current regimen.  As she has been working on healthy eating and has lost a significant amount of weight. She has been able to decrease insulin and continues on repaglinide.

## 2023-01-14 NOTE — Assessment & Plan Note (Signed)
Acute, most likely musculoskeletal strain.  Discussed with patient keeping an eye out for rash for possible shingles etiology. She did have some concern as she had some similar pain when she was first diagnosed with pulmonary embolism, but this does not seem likely given her normal vitals, no chest pain and no shortness of breath.  She also remains on Eliquis at treatment doses.  Recommended heat and gentle upper back stretching.  She will contact me if any symptoms change.  Return ER precautions provided.

## 2023-01-14 NOTE — Assessment & Plan Note (Addendum)
Chronic, now hemoglobin in the normal range after almost 6 months of iron supplementation. Unclear initial etiology but possibly nutritional since she has a very limited diet given migraine triggers.  I suggested she continue the iron for now and we will reevaluate at her physical in 6 months.  No known blood loss, possible component of anemia of chronic disease.

## 2023-01-14 NOTE — Assessment & Plan Note (Signed)
Chronic, inadequate control but on max statin and WelChol.  Working on lifestyle change.

## 2023-01-14 NOTE — Progress Notes (Signed)
Patient ID: Debra Leach, female    DOB: Nov 27, 1950, 72 y.o.   MRN: [id number]  This visit was conducted in person.  BP (!) 140/82 (BP Location: Left Arm, Patient Position: Sitting, Cuff Size: Normal)   Pulse 91   Temp 98 F (36.7 C) (Temporal)   Ht 5\' 1"  (1.549 m)   Wt 132 lb 4 oz (60 kg)   SpO2 97%   BMI 24.99 kg/m    CC:  Chief Complaint  Patient presents with   Pain    Around Left Shoulder Blade Area-States it feels similar when she had blood clots in lungs    Subjective:   HPI: Debra Leach is a 72 y.o. female presenting on 01/14/2023 for Pain (Around Left Shoulder Blade Area-States it feels similar when she had blood clots in lungs)   She would like to reschedule CPX  for early next year. AMW  done 09/2022   Pain under left shoulder blade, constant dull ache, occ sharper pain lasting seconds in last week.  Ongoing in last 9-10 days.  At rest and occ at night.  No SOB. No  chest pain, no palpitations.  On Eliquis   No falls, no lifting, twisting.   She is able to ride indoor bike 40 min now.. 3 times per week.. does not effect the pain    She has lost 15-20 lbs in last year... she thinks with insulin change.   Wt Readings from Last 3 Encounters:  01/14/23 132 lb 4 oz (60 kg)  12/03/22 132 lb (59.9 kg)  11/12/22 132 lb (59.9 kg)    DM well controlled on current regimen, followed by ENDO... did have recent steroid injection in left hip for OA. 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     Cdiff history .Marland Kitchen Now with nml BMs.   She has been  treating iron def anemia... Hg now improved  form 10.5 to 13.2  No  SE to iron.  No bleeding  Relevant past medical, surgical, family and social history reviewed and updated as indicated. Interim medical history since our last visit reviewed. Allergies and medications reviewed and  updated. Outpatient Medications Prior to Visit  Medication Sig Dispense Refill   aluminum-magnesium hydroxide 200-200 MG/5ML suspension Take by mouth every 6 (six) hours as needed for indigestion. As needed     atorvastatin (LIPITOR) 80 MG tablet TAKE 1 TABLET BY MOUTH DAILY 90 tablet 3   Blood Glucose Monitoring Suppl (ONETOUCH VERIO REFLECT) w/Device KIT TEST TWICE A DAY 1 kit 0   budesonide (ENTOCORT EC) 3 MG 24 hr capsule Take 2 capsules (6 mg total) by mouth daily. 60 capsule 12   colesevelam (WELCHOL) 625 MG tablet TAKE 3 TABLETS BY MOUTH 2 TIMES DAILY WITH A MEAL. 540 tablet 3   Continuous Blood Gluc Sensor (DEXCOM G7 SENSOR) MISC 1 Device by Does not apply route as directed. 9 each 3   ELIQUIS 2.5 MG TABS tablet TAKE 1 TABLET BY MOUTH 2 TIMES DAILY. 180 tablet 0   Eptinezumab-jjmr (VYEPTI) 100 MG/ML injection Inject 300 mg into the vein every 3 (three) months.     gabapentin (NEURONTIN) 100 MG capsule TAKE 1 CAPSULE BY MOUTH UP TO 3 TIMES DAILY AS NEEDED. 270 capsule 3   gabapentin (NEURONTIN) 300 MG capsule Take 2 capsules (600 mg total) by  mouth at bedtime. 180 capsule 1   insulin glargine (LANTUS SOLOSTAR) 100 UNIT/ML Solostar Pen Inject 14 Units into the skin daily. 15 mL 3   Insulin Pen Needle (PEN NEEDLES) 31G X 8 MM MISC 1 each by Does not apply route daily.     Lancets (ONETOUCH DELICA PLUS LANCET30G) MISC USE TO CHECK BLOOD SUGAR TWO TIMES DAILY 200 each 3   meclizine (ANTIVERT) 25 MG tablet Take 1 tablet (25 mg total) by mouth 3 (three) times daily as needed for dizziness. 30 tablet 1   omeprazole (PRILOSEC) 20 MG capsule Take 20 mg by mouth daily with supper.     ondansetron (ZOFRAN) 4 MG tablet Take 1 tablet (4 mg total) by mouth every 8 (eight) hours as needed for nausea or vomiting. 20 tablet 6   ONETOUCH VERIO test strip USE TO CHECK BLOOD SUGAR TWO TIMES A DAY 200 strip 3   potassium chloride SA (KLOR-CON M) 20 MEQ tablet TAKE 1 TABLET BY MOUTH DAILY. 90 tablet 3    repaglinide (PRANDIN) 0.5 MG tablet Take 2 tablets (1 mg) by mouth daily before lunch AND 1 tablet (0.5 mg) daily before supper     Ubrogepant (UBRELVY) 100 MG TABS Take 1 tablet (100 mg total) by mouth as needed (May repeat dose after 2 hours if needed.  Maximum 2 tablets in 24 hours). 10 tablet 3   vitamin B-12 (CYANOCOBALAMIN) 1000 MCG tablet Take 1,000 mcg by mouth daily with breakfast.     Vitamin D, Ergocalciferol, (DRISDOL) 1.25 MG (50000 UNIT) CAPS capsule TAKE 1 CAPSULE BY MOUTH ONCE A WEEK. 12 capsule 3   ferrous gluconate (FERGON) 324 MG tablet TAKE 1 TABLET (324 MG TOTAL) BY MOUTH DAILY WITH BREAKFAST. (Patient not taking: Reported on 01/14/2023) 60 tablet 0   Insulin Pen Needle 32G X 4 MM MISC 1 Device by Does not apply route daily in the afternoon. 100 each 3   repaglinide (PRANDIN) 0.5 MG tablet Take 2 tablets (1 mg total) by mouth daily before lunch AND 1 tablet (0.5 mg total) daily before supper. before breakfast and lunch. (Patient taking differently: Take 2 tablets (1 mg total) by mouth daily before lunch AND 1 tablet (0.5 mg total) daily before supper) 270 tablet 3   No facility-administered medications prior to visit.     Per HPI unless specifically indicated in ROS section below Review of Systems  Constitutional:  Negative for fatigue and fever.  HENT:  Negative for congestion.   Eyes:  Negative for pain.  Respiratory:  Negative for cough and shortness of breath.   Cardiovascular:  Negative for chest pain, palpitations and leg swelling.  Gastrointestinal:  Negative for abdominal pain.  Genitourinary:  Negative for dysuria and vaginal bleeding.  Musculoskeletal:  Negative for back pain.  Neurological:  Negative for syncope, light-headedness and headaches.  Psychiatric/Behavioral:  Negative for dysphoric mood.    Objective:  BP (!) 140/82 (BP Location: Left Arm, Patient Position: Sitting, Cuff Size: Normal)   Pulse 91   Temp 98 F (36.7 C) (Temporal)   Ht 5\' 1"  (1.549  m)   Wt 132 lb 4 oz (60 kg)   SpO2 97%   BMI 24.99 kg/m   Wt Readings from Last 3 Encounters:  01/14/23 132 lb 4 oz (60 kg)  12/03/22 132 lb (59.9 kg)  11/12/22 132 lb (59.9 kg)      Physical Exam Constitutional:      General: She is not in acute distress.  Appearance: Normal appearance. She is well-developed. She is not ill-appearing or toxic-appearing.  HENT:     Head: Normocephalic.     Right Ear: Hearing, tympanic membrane, ear canal and external ear normal. Tympanic membrane is not erythematous, retracted or bulging.     Left Ear: Hearing, tympanic membrane, ear canal and external ear normal. Tympanic membrane is not erythematous, retracted or bulging.     Nose: No mucosal edema or rhinorrhea.     Right Sinus: No maxillary sinus tenderness or frontal sinus tenderness.     Left Sinus: No maxillary sinus tenderness or frontal sinus tenderness.     Mouth/Throat:     Mouth: Oropharynx is clear and moist and mucous membranes are normal.     Pharynx: Uvula midline.  Eyes:     General: Lids are normal. Lids are everted, no foreign bodies appreciated.     Extraocular Movements: EOM normal.     Conjunctiva/sclera: Conjunctivae normal.     Pupils: Pupils are equal, round, and reactive to light.  Neck:     Thyroid: No thyroid mass or thyromegaly.     Vascular: No carotid bruit.     Trachea: Trachea normal.  Cardiovascular:     Rate and Rhythm: Normal rate and regular rhythm.     Pulses: Normal pulses.     Heart sounds: Normal heart sounds, S1 normal and S2 normal. No murmur heard.    No friction rub. No gallop.  Pulmonary:     Effort: Pulmonary effort is normal. No tachypnea or respiratory distress.     Breath sounds: Normal breath sounds. No decreased breath sounds, wheezing, rhonchi or rales.  Abdominal:     General: Bowel sounds are normal.     Palpations: Abdomen is soft.     Tenderness: There is no abdominal tenderness.  Musculoskeletal:     Cervical back: Normal range  of motion and neck supple.  Skin:    General: Skin is warm, dry and intact.     Findings: No rash.  Neurological:     Mental Status: She is alert.  Psychiatric:        Mood and Affect: Mood is not anxious or depressed.        Speech: Speech normal.        Behavior: Behavior normal. Behavior is cooperative.        Thought Content: Thought content normal.        Cognition and Memory: Cognition and memory normal.        Judgment: Judgment normal.       Results for orders placed or performed in visit on 12/29/22  VITAMIN D 25 Hydroxy (Vit-D Deficiency, Fractures)  Result Value Ref Range   VITD 49.39 30.00 - 100.00 ng/mL  CBC with Differential/Platelet  Result Value Ref Range   WBC 8.0 4.0 - 10.5 K/uL   RBC 4.73 3.87 - 5.11 Mil/uL   Hemoglobin 13.2 12.0 - 15.0 g/dL   HCT 96.0 45.4 - 09.8 %   MCV 89.1 78.0 - 100.0 fl   MCHC 31.4 30.0 - 36.0 g/dL   RDW 11.9 (H) 14.7 - 82.9 %   Platelets 293.0 150.0 - 400.0 K/uL   Neutrophils Relative % 67.2 43.0 - 77.0 %   Lymphocytes Relative 22.0 12.0 - 46.0 %   Monocytes Relative 7.7 3.0 - 12.0 %   Eosinophils Relative 2.1 0.0 - 5.0 %   Basophils Relative 1.0 0.0 - 3.0 %   Neutro Abs 5.4 1.4 - 7.7 K/uL  Lymphs Abs 1.8 0.7 - 4.0 K/uL   Monocytes Absolute 0.6 0.1 - 1.0 K/uL   Eosinophils Absolute 0.2 0.0 - 0.7 K/uL   Basophils Absolute 0.1 0.0 - 0.1 K/uL  IBC + Ferritin  Result Value Ref Range   Iron 60 42 - 145 ug/dL   Transferrin 161.0 960.4 - 360.0 mg/dL   Saturation Ratios 54.0 (L) 20.0 - 50.0 %   Ferritin 19.1 10.0 - 291.0 ng/mL   TIBC 312.2 250.0 - 450.0 mcg/dL  Lipid panel  Result Value Ref Range   Cholesterol 260 (H) 0 - 200 mg/dL   Triglycerides 981.1 (H) 0.0 - 149.0 mg/dL   HDL 91.47 >82.95 mg/dL   VLDL 62.1 (H) 0.0 - 30.8 mg/dL   Total CHOL/HDL Ratio 5    NonHDL 203.05   Comprehensive metabolic panel  Result Value Ref Range   Sodium 144 135 - 145 mEq/L   Potassium 4.2 3.5 - 5.1 mEq/L   Chloride 107 96 - 112 mEq/L    CO2 29 19 - 32 mEq/L   Glucose, Bld 110 (H) 70 - 99 mg/dL   BUN 13 6 - 23 mg/dL   Creatinine, Ser 6.57 0.40 - 1.20 mg/dL   Total Bilirubin 0.5 0.2 - 1.2 mg/dL   Alkaline Phosphatase 62 39 - 117 U/L   AST 15 0 - 37 U/L   ALT 18 0 - 35 U/L   Total Protein 6.2 6.0 - 8.3 g/dL   Albumin 3.6 3.5 - 5.2 g/dL   GFR 84.69 >62.95 mL/min   Calcium 9.1 8.4 - 10.5 mg/dL  LDL cholesterol, direct  Result Value Ref Range   Direct LDL 164.0 mg/dL   *Note: Due to a large number of results and/or encounters for the requested time period, some results have not been displayed. A complete set of results can be found in Results Review.    Assessment and Plan  Upper back pain on left side Assessment & Plan: Acute, most likely musculoskeletal strain.  Discussed with patient keeping an eye out for rash for possible shingles etiology. She did have some concern as she had some similar pain when she was first diagnosed with pulmonary embolism, but this does not seem likely given her normal vitals, no chest pain and no shortness of breath.  She also remains on Eliquis at treatment doses.  Recommended heat and gentle upper back stretching.  She will contact me if any symptoms change.  Return ER precautions provided.   Autoimmune pancreatitis [facility]) Assessment & Plan: Chronic Followed by Dr. Meridee Score. Chronic budesonide. She is feeling better altogether.   Hyperlipidemia associated with type 2 diabetes mellitus (HCC) Assessment & Plan: Chronic, inadequate control but on max statin and WelChol.  Working on lifestyle change.   Iron deficiency anemia, unspecified iron deficiency anemia type Assessment & Plan: Chronic, now hemoglobin in the normal range after almost 6 months of iron supplementation. Unclear initial etiology but possibly nutritional since she has a very limited diet given migraine triggers.  I suggested she continue the iron for now and we will reevaluate at her physical in 6 months.  No  known blood loss, possible component of anemia of chronic disease.   Hypertension associated with diabetes [facility]) Assessment & Plan: Chronic, well-controlled on no medication.   Type 2 diabetes mellitus with stage 3a chronic kidney disease, with long-term current use of insulin (HCC) Assessment & Plan: Chronic, significant improvement with assistance of endocrinology on current regimen.  As she has been working on  healthy eating and has lost a significant amount of weight. She has been able to decrease insulin and continues on repaglinide.   Other orders -     Ferrous Gluconate; Take 1 tablet (324 mg total) by mouth daily with breakfast.  Dispense: 90 tablet; Refill: 1    Return in about 8 months (around 09/14/2023) for phone AMW,  fasting labs then CPE with me.   Kerby Nora, MD

## 2023-01-26 ENCOUNTER — Other Ambulatory Visit: Payer: Self-pay | Admitting: Family Medicine

## 2023-01-26 ENCOUNTER — Encounter: Payer: Self-pay | Admitting: Family Medicine

## 2023-01-27 ENCOUNTER — Encounter: Payer: Self-pay | Admitting: Family Medicine

## 2023-02-08 ENCOUNTER — Other Ambulatory Visit: Payer: Self-pay | Admitting: Family Medicine

## 2023-02-08 DIAGNOSIS — Z1231 Encounter for screening mammogram for malignant neoplasm of breast: Secondary | ICD-10-CM

## 2023-02-09 ENCOUNTER — Encounter: Payer: Self-pay | Admitting: Gastroenterology

## 2023-02-09 ENCOUNTER — Other Ambulatory Visit (INDEPENDENT_AMBULATORY_CARE_PROVIDER_SITE_OTHER): Payer: Medicare Other

## 2023-02-09 ENCOUNTER — Ambulatory Visit (INDEPENDENT_AMBULATORY_CARE_PROVIDER_SITE_OTHER): Payer: Medicare Other | Admitting: Gastroenterology

## 2023-02-09 VITALS — BP 148/70 | HR 82 | Ht 60.0 in | Wt 132.0 lb

## 2023-02-09 DIAGNOSIS — Z7901 Long term (current) use of anticoagulants: Secondary | ICD-10-CM

## 2023-02-09 DIAGNOSIS — Z8619 Personal history of other infectious and parasitic diseases: Secondary | ICD-10-CM | POA: Diagnosis not present

## 2023-02-09 DIAGNOSIS — R1310 Dysphagia, unspecified: Secondary | ICD-10-CM | POA: Diagnosis not present

## 2023-02-09 DIAGNOSIS — K861 Other chronic pancreatitis: Secondary | ICD-10-CM

## 2023-02-09 DIAGNOSIS — D8984 IgG4-related disease: Secondary | ICD-10-CM

## 2023-02-09 LAB — COMPREHENSIVE METABOLIC PANEL
ALT: 26 U/L (ref 0–35)
AST: 16 U/L (ref 0–37)
Albumin: 3.5 g/dL (ref 3.5–5.2)
Alkaline Phosphatase: 68 U/L (ref 39–117)
BUN: 17 mg/dL (ref 6–23)
CO2: 30 meq/L (ref 19–32)
Calcium: 9.2 mg/dL (ref 8.4–10.5)
Chloride: 104 meq/L (ref 96–112)
Creatinine, Ser: 0.93 mg/dL (ref 0.40–1.20)
GFR: 61.72 mL/min (ref >60.00)
Glucose, Bld: 210 mg/dL — ABNORMAL HIGH (ref 70–99)
Potassium: 3.6 meq/L (ref 3.5–5.1)
Sodium: 141 meq/L (ref 135–145)
Total Bilirubin: 0.4 mg/dL (ref 0.2–1.2)
Total Protein: 6.9 g/dL (ref 6.0–8.3)

## 2023-02-09 LAB — CBC WITH DIFFERENTIAL/PLATELET
Basophils Absolute: 0.1 10*3/uL (ref 0.0–0.1)
Basophils Relative: 0.7 % (ref 0.0–3.0)
Eosinophils Absolute: 0.1 10*3/uL (ref 0.0–0.7)
Eosinophils Relative: 1.1 % (ref 0.0–5.0)
HCT: 43.9 % (ref 36.0–46.0)
Hemoglobin: 14 g/dL (ref 12.0–15.0)
Lymphocytes Relative: 18.2 % (ref 12.0–46.0)
Lymphs Abs: 1.7 10*3/uL (ref 0.7–4.0)
MCHC: 31.9 g/dL (ref 30.0–36.0)
MCV: 89.9 fl (ref 78.0–100.0)
Monocytes Absolute: 0.7 10*3/uL (ref 0.1–1.0)
Monocytes Relative: 7.1 % (ref 3.0–12.0)
Neutro Abs: 6.9 10*3/uL (ref 1.4–7.7)
Neutrophils Relative %: 72.9 % (ref 43.0–77.0)
Platelets: 299 10*3/uL (ref 150.0–400.0)
RBC: 4.89 Mil/uL (ref 3.87–5.11)
RDW: 16.5 % — ABNORMAL HIGH (ref 11.5–15.5)
WBC: 9.4 10*3/uL (ref 4.0–10.5)

## 2023-02-09 NOTE — Progress Notes (Signed)
GASTROENTEROLOGY OUTPATIENT CLINIC VISIT   Primary Care Provider Excell Seltzer, MD [address] [place] [address] [phone]  Patient Profile: Debra Leach is a 72 y.o. 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  The patient presents to the [facility] for an evaluation and management of problem(s) noted below:  Problem List 1. Dysphagia, unspecified type   2. History of Clostridioides difficile infection   3. 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HCC)   4. IgG4 related disease   5. Chronic anticoagulation    History of Present Illness Please see prior notes for full details of HPI.  Interval History The patient returns for scheduled follow-up.  She has been having symptoms of dysphagia to solid foods and then reach out to our office and we have are actually scheduled for an endoscopy later this week.  This is occurring more frequently.  Thankfully, after diagnosing her with C. difficile earlier this year she has had improvement in her bowel movements after treatment.  If she has another episode of C. difficile in the future we will consider treatment with Fidaxomicin +/- Rebyota or VOWST to try to prevent her from having further issues thereafter.  For now she is gena continue on budesonide therapy as she could not tolerate azathioprine as we had a concern that it could be leading to issues earlier this year when she came in with unclear septic physiology.    GI Review of Systems Positive as above Negative for odynophagia, diarrhea, melena, hematochezia   Review of Systems General: Denies fevers/chills/further unintentional weight loss Cardiovascular: Denies chest pain Pulmonary: Denies shortness of breath Gastroenterological: See  HPI Genitourinary: Denies darkened urine Hematological: Denies easy bruising/bleeding Dermatological: Denies jaundice Psychological: Mood is stable   Medications Current Outpatient Medications  Medication Sig Dispense Refill   aluminum-magnesium hydroxide 200-200 MG/5ML suspension Take by mouth every 6 (six) hours as needed for indigestion. As needed     atorvastatin (LIPITOR) 80 MG tablet TAKE 1 TABLET BY MOUTH DAILY 90 tablet 3   Blood Glucose Monitoring Suppl (ONETOUCH VERIO REFLECT) w/Device KIT TEST TWICE A DAY 1 kit 0   budesonide (ENTOCORT EC) 3 MG 24 hr capsule Take 2 capsules (6 mg total) by mouth daily. 60 capsule 12   colesevelam (WELCHOL) 625 MG tablet TAKE 3 TABLETS BY MOUTH 2 TIMES DAILY WITH A MEAL. 540 tablet 3   Continuous Blood Gluc Sensor (DEXCOM G7 SENSOR) MISC 1 Device by Does not apply route as directed. 9 each 3   ELIQUIS 2.5 MG TABS tablet TAKE 1 TABLET BY MOUTH 2 TIMES DAILY. 180 tablet 0   Eptinezumab-jjmr (VYEPTI) 100 MG/ML injection Inject 300 mg into the vein every 3 (three) months.     ferrous gluconate (FERGON) 324 MG tablet Take 1 tablet (324 mg total) by mouth daily with breakfast. 90 tablet 1   gabapentin (NEURONTIN) 100 MG capsule TAKE 1 CAPSULE BY MOUTH UP TO 3 TIMES DAILY AS NEEDED. 270 capsule 3   gabapentin (NEURONTIN) 300 MG capsule Take 2 capsules (600 mg total) by mouth at bedtime. 180 capsule 1   insulin glargine (LANTUS SOLOSTAR) 100 UNIT/ML Solostar Pen Inject 14 Units into the skin daily. 15 mL 3   Insulin Pen Needle 32G X 4 MM MISC by Does not apply route.     Lancets (ONETOUCH DELICA PLUS LANCET30G) MISC USE TO CHECK  BLOOD SUGAR TWO TIMES DAILY 200 each 3   meclizine (ANTIVERT) 25 MG tablet Take 1 tablet (25 mg total) by mouth 3 (three) times daily as needed for dizziness. 30 tablet 1   omeprazole (PRILOSEC) 20 MG capsule Take 20 mg by mouth daily with supper.     ondansetron (ZOFRAN) 4 MG tablet Take 1 tablet (4 mg total) by mouth every 8  (eight) hours as needed for nausea or vomiting. 20 tablet 6   ONETOUCH VERIO test strip USE TO CHECK BLOOD SUGAR TWO TIMES A DAY 200 strip 3   potassium chloride SA (KLOR-CON M) 20 MEQ tablet TAKE 1 TABLET BY MOUTH DAILY. 90 tablet 3   repaglinide (PRANDIN) 0.5 MG tablet Take 2 tablets (1 mg) by mouth daily before lunch AND 1 tablet (0.5 mg) daily before supper     Ubrogepant (UBRELVY) 100 MG TABS Take 1 tablet (100 mg total) by mouth as needed (May repeat dose after 2 hours if needed.  Maximum 2 tablets in 24 hours). 10 tablet 3   vitamin B-12 (CYANOCOBALAMIN) 1000 MCG tablet Take 1,000 mcg by mouth daily with breakfast.     Vitamin D, Ergocalciferol, (DRISDOL) 1.25 MG (50000 UNIT) CAPS capsule TAKE 1 CAPSULE BY MOUTH ONCE A WEEK. 12 capsule 3   No current facility-administered medications for this visit.    Allergies Allergies  Allergen Reactions   Sudafed [Pseudoephedrine Hcl] Hives, Itching and Anxiety   Atenolol Other (See Comments)    Migraine   Codeine Itching and Swelling    REACTION: Migraine   Epinephrine Hives   Hydrocodone Other (See Comments)    Migraine   Ibuprofen Other (See Comments)    Migraine   Jardiance [Empagliflozin] Other (See Comments)    Caused uti Yeast infection   Metronidazole Swelling   Metronidazole And Related Swelling   Oxycodone Other (See Comments)    Migraine   Penicillins Itching and Swelling    ** Tolerates cephalosporins Has patient had a PCN reaction causing immediate rash, facial/tongue/throat swelling, SOB or lightheadedness with hypotension: No Has patient had a PCN reaction causing severe rash involving mucus membranes or skin necrosis: No Has patient had a PCN reaction that required hospitalization: No Has patient had a PCN reaction occurring within the last 10 years: No     Pentazocine Lactate     REACTION: Swelling, itching, rash   Promethazine Other (See Comments)    migraine   Propranolol Other (See Comments)    Migraine    Talwin [Pentazocine] Other (See Comments)    Swelling and itching    Tramadol Other (See Comments)    Migraine   Vancomycin Swelling    Swelling of Lips and Mouth   Crestor [Rosuvastatin] Other (See Comments)    Muscle pain    Histories 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HCC)    Clotting disorder (HCC)    Complication of anesthesia    difficult waking   Diabetes mellitus without complication (HCC)    Diet and exercise controlled   Diverticulosis    DVT (deep venous thrombosis) (HCC) 02/2019   3calf 2 right lung 1 left lung   DVT (deep venous thrombosis) (HCC) 2020   right leg   Dyspnea    GERD (gastroesophageal reflux disease)    Headache    Hepatic steatosis    Hiatal hernia  History of kidney stones    Hyperlipidemia    Hypertension    06/29/22 has not had to take blood pressure meds in over 1 year.   MVP (mitral valve prolapse)    history of   PONV (postoperative nausea and vomiting)    Pulmonary emboli (HCC) 03/01/2019   2 in the right; 1 in the left   Past Surgical History:  Procedure Laterality Date   arm surgery  2012   left forearm fracture w/ plates pins screws   ARTERY BIOPSY Right 12/03/2021   Procedure: BIOPSY TEMPORAL ARTERY;  Surgeon: Annice Needy, MD;  Location: [facility] ORS;  Service: Vascular;  Laterality: Right;  Right temple   BREAST BIOPSY Left    BREAST SURGERY  2000   breast biopsy (benign)   CATARACT EXTRACTION Right 2005   CATARACT EXTRACTION Left 2010   LITHOTRIPSY  08/12/2008   stent placed bilaterally   PANCREAS BIOPSY  2021   PANCREAS BIOPSY  2019   PARTIAL HYSTERECTOMY     Both ovaries remain, vaginal, for mennorhagia   REPAIR EXTENSOR TENDON Right 06/30/2022   Procedure: Middle and ring finger sagittal band reconstruction;  Surgeon: Marlyne Beards, MD;  Location: [facility] OR;  Service: Orthopedics;  Laterality: Right;   TONSILLECTOMY      TUBAL LIGATION  1980   Social History   Socioeconomic History   Marital status: Married    Spouse name: Debra Leach   Number of children: 0   Years of education: Not on file   Highest education level: Bachelor's degree (e.g., BA, AB, BS)  Occupational History   Occupation: retired Freight forwarder: retired  Tobacco Use   Smoking status: Former    Current packs/day: 0.00    Average packs/day: 1 pack/day for 13.0 years (13.0 ttl pk-yrs)    Types: Cigarettes    Start date: 06/07/1978    Quit date: 06/08/1991    Years since quitting: 31.7    Passive exposure: Never   Smokeless tobacco: Never  Vaping Use   Vaping status: Never Used  Substance and Sexual Activity   Alcohol use: No   Drug use: No   Sexual activity: Not on file  Other Topics Concern   Not on file  Social History Narrative   Regular exercise--yes, recumbent bike 3-4 days a week      Diet: fruits and veggies, water, eats at home, drinks a lot of milk      Patient is right-handed. She drinks 1-2 cups of coffee a day.      One story home   Social Determinants of Health   Financial Resource Strain: Low Risk  (09/13/2022)   Overall Financial Resource Strain (CARDIA)    Difficulty of Paying Living Expenses: Not hard at all  Food Insecurity: No Food Insecurity (09/13/2022)   Hunger Vital Sign    Worried About Running Out of Food in the Last Year: Never true    Ran Out of Food in the Last Year: Never true  Transportation Needs: No Transportation Needs (09/13/2022)   PRAPARE - Administrator, Civil Service (Medical): No    Lack of Transportation (Non-Medical): No  Physical Activity: Insufficiently Active (09/13/2022)   Exercise Vital Sign    Days of Exercise per Week: 4 days    Minutes of Exercise per Session: 30 min  Stress: No Stress Concern Present (09/13/2022)   [employer] of Occupational Health - Occupational Stress Questionnaire    Feeling of Stress :  Not at all  Social Connections:  Moderately Isolated (09/13/2022)   Social Connection and Isolation Panel [NHANES]    Frequency of Communication with Friends and Family: Twice a week    Frequency of Social Gatherings with Friends and Family: Three times a week    Attends Religious Services: Never    Active Member of Clubs or Organizations: No    Attends Banker Meetings: Never    Marital Status: Married  Catering manager Violence: Not At Risk (09/13/2022)   Humiliation, Afraid, Rape, and Kick questionnaire    Fear of Current or Ex-Partner: No    Emotionally Abused: No    Physically Abused: No    Sexually Abused: No   Family History  Problem Relation Age of Onset   Bone cancer Mother    Hypertension Father    Mitral valve prolapse Father    Asthma Brother    Arthritis Brother    Nephrolithiasis Brother    Asthma Brother    COPD Brother    Breast cancer Maternal Aunt    Breast cancer Maternal Aunt    Colon cancer Neg Hx    Esophageal cancer Neg Hx    Inflammatory bowel disease Neg Hx    Liver disease Neg Hx    Pancreatic cancer Neg Hx    Rectal cancer Neg Hx    Stomach cancer Neg Hx    I have reviewed her medical, social, and family history in detail and updated the electronic medical record as necessary.    PHYSICAL EXAMINATION  BP (!) 148/70   Pulse 82   Ht 5' (1.524 m)   Wt 132 lb (59.9 kg)   SpO2 98%   BMI 25.78 kg/m  Wt Readings from Last 3 Encounters:  02/11/23 132 lb (59.9 kg)  02/09/23 132 lb (59.9 kg)  01/14/23 132 lb 4 oz (60 kg)  GEN: NAD, appears stated age, doesn't appear chronically ill PSYCH: Cooperative, without pressured speech EYE: Conjunctivae pink, sclerae anicteric ENT: MMM CV: Nontachycardic RESP: No audible wheezing GI: NABS, soft, NT/ND, without rebound or guarding MSK/EXT: No pedal edema present SKIN: No jaundice NEURO:  Alert & Oriented x 3, no focal deficits   REVIEW OF DATA  I reviewed the following data at the time of this encounter:  GI Procedures  and Studies  Today we reviewed prior GI procedures from above June 2018 EGD The oropharynx was abnormal for 1 to 2 mm mucosal nodules on the soft palate.  Oropharynx was otherwise normal.  The upper esophageal sphincter was easily traversed.  The proximal/mid/distal segments of the esophagus appeared normal.  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  Z-line was 35 cm with a hiatal hernia extending to 40 cm.  Acid reflux or Barrett's.  The gastric cardia/fundus/body/antrum were all normal.  Disease.  The pylorus was symmetric and traversed.  Duodenal bulb and second portion were normal.  Savory dilation was performed disruption.  Savory 15/16/17/18 performed.  Esophageal manometry 2018 LESP 19.6 (normal 13-43) IRP 8.1 (normal less than 15) Esophageal motility 10% fragmented 90% intact UES P70.9 (normal 34-1 04) Bolus clearance 60% clearance by impedance DCIS measures 1444.7 (normal 500-500)  2020 EGD Normal EGD examination Dilation with single 18 mm savory performed. Hiatal hernia 4 cm Continue PPI and antireflux lifestyle  2022 EGD Normal EGD examination. 4 cm hiatal hernia.  Laboratory Studies  Reviewed those in epic and care everywhere  Imaging Studies  No new imaging study to review   ASSESSMENT  Ms. Halterman  is a 72 y.o. 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  The patient is seen today for evaluation and management of:  1. Dysphagia, unspecified type   2. History of Clostridioides difficile infection   3. 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HCC)   4. IgG4 related disease   5. Chronic anticoagulation    The patient is hemodynamically stable.  From a GI perspective, her biggest symptom currently is dysphagia and we have a diagnostic endoscopy with empiric dilation prepared  to be performed later this week.  She will hold her blood thinner at least 1 day prior as per our clearance.  The risks and benefits of endoscopic evaluation were discussed with the patient; these include but are not limited to the risk of perforation, infection, bleeding, missed lesions, lack of diagnosis, severe illness requiring hospitalization, as well as anesthesia and sedation related illnesses.  The patient and/or family is agreeable to proceed.  We are going to keep her on her current dose of budesonide since she seems to be doing quite well otherwise from an AIP perspective.  Will consider discussion further with rheumatology in future about rituximab.Marland Kitchen  Her elevated fecal calprotectin was around the time of her C. difficile infection, but we will plan to repeat that in the coming weeks.  Otherwise unless she has significant changes in her bowel habits again, we do not need to consider an updated colonoscopy until 2032 (pending her health at the time).   All patient questions were answered to the best of my ability, and the patient agrees to the aforementioned plan of action with follow-up as indicated.   PLAN  Proceed with scheduled endoscopy later this week - Hold anticoagulation as per clearance protocol Will plan MRI/MRCP of the pancreas by March 2024 (a year from last imaging study) for AIP surveillance Continue budesonide 6 mg daily Consider rituximab in future - Will discuss with rheumatology depending on how she is doing overall Patient should have bone density evaluated and vitamin D checked regularly as per PCP protocol since she is on chronic steroids Colonoscopy screening in 2032 Get fecal calprotectin level in the coming weeks   Orders Placed This Encounter  Procedures   Calprotectin, Fecal    Modified Medications   No medications on file    Planned Follow Up No follow-ups on file.   Total Time in Face-to-Face and in Coordination of Care for patient including  independent/personal interpretation/review of prior testing, medical history, examination, medication adjustment, communicating results with the patient directly, and documentation within the EHR is 25 minutes.   Corliss Parish, MD [facility] Office # [phone]

## 2023-02-09 NOTE — Patient Instructions (Signed)
You have been scheduled for an endoscopy. Please follow written instructions given to you at your visit today.  If you use inhalers (even only as needed), please bring them with you on the day of your procedure.  If you take any of the following medications, they will need to be adjusted prior to your procedure:   DO NOT TAKE 7 DAYS PRIOR TO TEST- Trulicity (dulaglutide) Ozempic, Wegovy (semaglutide) Mounjaro (tirzepatide) Bydureon Bcise (exanatide extended release)  DO NOT TAKE 1 DAY PRIOR TO YOUR TEST Rybelsus (semaglutide) Adlyxin (lixisenatide) Victoza (liraglutide) Byetta (exanatide) ___________________________________________________________________________   Your provider has requested that you go to the basement level for lab work before leaving today. Press "B" on the elevator. The lab is located at the first door on the left as you exit the elevator.  Due to recent changes in healthcare laws, you may see the results of your imaging and laboratory studies on MyChart before your provider has had a chance to review them.  We understand that in some cases there may be results that are confusing or concerning to you. Not all laboratory results come back in the same time frame and the provider may be waiting for multiple results in order to interpret others.  Please give [place] 48 hours in order for your provider to thoroughly review all the results before contacting the office for clarification of your results.   Thank you for choosing me and [facility].  Dr. Meridee Score

## 2023-02-10 ENCOUNTER — Other Ambulatory Visit: Payer: Medicare Other

## 2023-02-10 DIAGNOSIS — Z8619 Personal history of other infectious and parasitic diseases: Secondary | ICD-10-CM

## 2023-02-11 ENCOUNTER — Ambulatory Visit (AMBULATORY_SURGERY_CENTER): Payer: Medicare Other | Admitting: Gastroenterology

## 2023-02-11 ENCOUNTER — Encounter: Payer: Self-pay | Admitting: Gastroenterology

## 2023-02-11 VITALS — BP 131/68 | HR 68 | Temp 96.2°F | Resp 16 | Ht 60.0 in | Wt 132.0 lb

## 2023-02-11 DIAGNOSIS — R1310 Dysphagia, unspecified: Secondary | ICD-10-CM

## 2023-02-11 DIAGNOSIS — K449 Diaphragmatic hernia without obstruction or gangrene: Secondary | ICD-10-CM

## 2023-02-11 DIAGNOSIS — K319 Disease of stomach and duodenum, unspecified: Secondary | ICD-10-CM

## 2023-02-11 DIAGNOSIS — Z8619 Personal history of other infectious and parasitic diseases: Secondary | ICD-10-CM

## 2023-02-11 DIAGNOSIS — R1319 Other dysphagia: Secondary | ICD-10-CM

## 2023-02-11 DIAGNOSIS — K222 Esophageal obstruction: Secondary | ICD-10-CM

## 2023-02-11 MED ORDER — SODIUM CHLORIDE 0.9 % IV SOLN: 500.0000 mL | INTRAVENOUS | Status: DC

## 2023-02-11 NOTE — Op Note (Signed)
[facility] Patient Name: Debra Leach Procedure Date: 02/11/2023 7:57 AM MRN: [id number] Endoscopist: Corliss Parish , MD, [id number] Age: 72 Referring MD:  Date of Birth: [birth year] Gender: Female Account #: [id number] Procedure:                Upper GI endoscopy Indications:              Dysphagia Medicines:                Monitored Anesthesia Care Procedure:                Pre-Anesthesia Assessment:                           - Prior to the procedure, a History and Physical                            was performed, and patient medications and                            allergies were reviewed. The patient's tolerance of                            previous anesthesia was also reviewed. The risks                            and benefits of the procedure and the sedation                            options and risks were discussed with the patient.                            All questions were answered, and informed consent                            was obtained. Prior Anticoagulants: The patient has                            taken Eliquis (apixaban), last dose was 2 days                            prior to procedure. ASA Grade Assessment: III - A                            patient with severe systemic disease. After                            reviewing the risks and benefits, the patient was                            deemed in satisfactory condition to undergo the                            procedure.  After obtaining informed consent, the endoscope was                            passed under direct vision. Throughout the                            procedure, the patient's blood pressure, pulse, and                            oxygen saturations were monitored continuously. The                            Olympus Scope [id number] was introduced through the                            mouth, and advanced to the second part of duodenum.                             The upper GI endoscopy was accomplished without                            difficulty. The patient tolerated the procedure. Scope In: Scope Out: Findings:                 No gross lesions were noted in the entire                            esophagus. Biopsies were taken with a cold forceps                            for histology. A guidewire was placed and the scope                            was withdrawn. Dilation was performed with a Savary                            dilator with no resistance at 18 mm. The dilation                            site was examined following endoscope reinsertion                            and showed mild mucosal disruption, mild                            improvement in luminal narrowing and no perforation.                           A non-obstructing Schatzki ring was found at the                            gastroesophageal junction. Biopsies were taken with  a cold forceps for histology.                           The Z-line was regular and was found 33 cm from the                            incisors.                           A 4 cm hiatal hernia was present.                           Patchy mildly erythematous mucosa without bleeding                            was found in the gastric antrum.                           No gross lesions were noted in the entire examined                            stomach. Biopsies were taken with a cold forceps                            for histology and Helicobacter pylori testing.                           No gross lesions were noted in the duodenal bulb,                            in the first portion of the duodenum and in the                            second portion of the duodenum. Complications:            No immediate complications. Estimated Blood Loss:     Estimated blood loss was minimal. Impression:               - No gross lesions in the entire esophagus.                             Biopsied. Dilated.                           - Non-obstructing Schatzki ring. Biopsied.                           - Z-line regular, 33 cm from the incisors.                           - 4 cm hiatal hernia.                           - Erythematous mucosa in the antrum.                           -  No gross lesions in the entire stomach. Biopsied.                           - No gross lesions in the duodenal bulb, in the                            first portion of the duodenum and in the second                            portion of the duodenum. Recommendation:           - The patient will be observed post-procedure,                            until all discharge criteria are met.                           - Discharge patient to home.                           - Patient has a contact number available for                            emergencies. The signs and symptoms of potential                            delayed complications were discussed with the                            patient. Return to normal activities tomorrow.                            Written discharge instructions were provided to the                            patient.                           - Dilation diet as per protocol.                           - Please use Cepacol or Halls Lozenges +/-                            Chloraseptic spray for next 72-96 hours to aid in                            sore thoat should you experience this.                           - Restart Eliquis on 9/8 to decrease risk of                            post-interventional bleeding.                           -  Continue present medications otherwise.                           - If dysphagia symptoms improve for a period of                            time we can consider repeat EGD with dilation if                            necessary otherwise will need manometry to further                            evaluate.                            - Observe patient's clinical course.                           - The findings and recommendations were discussed                            with the patient.                           - The findings and recommendations were discussed                            with the patient's family. Corliss Parish, MD 02/11/2023 8:32:20 AM

## 2023-02-11 NOTE — Progress Notes (Unsigned)
Report to PACU, RN, vss, BBS= Clear.  

## 2023-02-11 NOTE — Progress Notes (Unsigned)
GASTROENTEROLOGY PROCEDURE H&P NOTE   Primary Care Physician: Excell Seltzer, MD  HPI: Debra Leach is a 72 y.o. female who presents for EGD for evaluation of Dysphagia.  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HCC)    Clotting disorder (HCC)    Complication of anesthesia    difficult waking   Diabetes mellitus without complication (HCC)    Diet and exercise controlled   Diverticulosis    DVT (deep venous thrombosis) (HCC) 02/2019   3calf 2 right lung 1 left lung   DVT (deep venous thrombosis) (HCC) 2020   right leg   Dyspnea    GERD (gastroesophageal reflux disease)    Headache    Hepatic steatosis    Hiatal hernia    History of kidney stones    Hyperlipidemia    Hypertension    06/29/22 has not had to take blood pressure meds in over 1 year.   MVP (mitral valve prolapse)    history of   PONV (postoperative nausea and vomiting)    Pulmonary emboli (HCC) 03/01/2019   2 in the right; 1 in the left   Past Surgical History:  Procedure Laterality Date   arm surgery  2012   left forearm fracture w/ plates pins screws   ARTERY BIOPSY Right 12/03/2021   Procedure: BIOPSY TEMPORAL ARTERY;  Surgeon: Annice Needy, MD;  Location: [facility] ORS;  Service: Vascular;  Laterality: Right;  Right temple   BREAST BIOPSY Left    BREAST SURGERY  2000   breast biopsy (benign)   CATARACT EXTRACTION Right 2005   CATARACT EXTRACTION Left 2010   LITHOTRIPSY  08/12/2008   stent placed bilaterally   PANCREAS BIOPSY  2021   PANCREAS BIOPSY  2019   PARTIAL HYSTERECTOMY     Both ovaries remain, vaginal, for mennorhagia   REPAIR EXTENSOR TENDON Right 06/30/2022   Procedure: Middle and ring finger sagittal band reconstruction;  Surgeon: Marlyne Beards, MD;  Location: [facility];  Service: Orthopedics;  Laterality: Right;   TONSILLECTOMY     TUBAL LIGATION  1980   Current Outpatient  Medications  Medication Sig Dispense Refill   aluminum-magnesium hydroxide 200-200 MG/5ML suspension Take by mouth every 6 (six) hours as needed for indigestion. As needed     atorvastatin (LIPITOR) 80 MG tablet TAKE 1 TABLET BY MOUTH DAILY 90 tablet 3   budesonide (ENTOCORT EC) 3 MG 24 hr capsule Take 2 capsules (6 mg total) by mouth daily. 60 capsule 12   colesevelam (WELCHOL) 625 MG tablet TAKE 3 TABLETS BY MOUTH 2 TIMES DAILY WITH A MEAL. 540 tablet 3   ELIQUIS 2.5 MG TABS tablet TAKE 1 TABLET BY MOUTH 2 TIMES DAILY. 180 tablet 0   ferrous gluconate (FERGON) 324 MG tablet Take 1 tablet (324 mg total) by mouth daily with breakfast. 90 tablet 1   gabapentin (NEURONTIN) 100 MG capsule TAKE 1 CAPSULE BY MOUTH UP TO 3 TIMES DAILY AS NEEDED. 270 capsule 3   gabapentin (NEURONTIN) 300 MG capsule Take 2 capsules (600 mg total) by mouth at bedtime. 180 capsule 1   insulin glargine (LANTUS SOLOSTAR) 100 UNIT/ML Solostar Pen Inject 14 Units into the skin daily. 15 mL 3   omeprazole (PRILOSEC) 20 MG capsule Take 20 mg by mouth daily with supper.     potassium chloride SA (KLOR-CON M) 20 MEQ tablet  TAKE 1 TABLET BY MOUTH DAILY. 90 tablet 3   repaglinide (PRANDIN) 0.5 MG tablet Take 2 tablets (1 mg) by mouth daily before lunch AND 1 tablet (0.5 mg) daily before supper     Ubrogepant (UBRELVY) 100 MG TABS Take 1 tablet (100 mg total) by mouth as needed (May repeat dose after 2 hours if needed.  Maximum 2 tablets in 24 hours). 10 tablet 3   vitamin B-12 (CYANOCOBALAMIN) 1000 MCG tablet Take 1,000 mcg by mouth daily with breakfast.     Vitamin D, Ergocalciferol, (DRISDOL) 1.25 MG (50000 UNIT) CAPS capsule TAKE 1 CAPSULE BY MOUTH ONCE A WEEK. 12 capsule 3   Blood Glucose Monitoring Suppl (ONETOUCH VERIO REFLECT) w/Device KIT TEST TWICE A DAY 1 kit 0   Continuous Blood Gluc Sensor (DEXCOM G7 SENSOR) MISC 1 Device by Does not apply route as directed. 9 each 3   Eptinezumab-jjmr (VYEPTI) 100 MG/ML injection Inject  300 mg into the vein every 3 (three) months.     Insulin Pen Needle 32G X 4 MM MISC by Does not apply route.     Lancets (ONETOUCH DELICA PLUS LANCET30G) MISC USE TO CHECK BLOOD SUGAR TWO TIMES DAILY 200 each 3   meclizine (ANTIVERT) 25 MG tablet Take 1 tablet (25 mg total) by mouth 3 (three) times daily as needed for dizziness. 30 tablet 1   ondansetron (ZOFRAN) 4 MG tablet Take 1 tablet (4 mg total) by mouth every 8 (eight) hours as needed for nausea or vomiting. 20 tablet 6   ONETOUCH VERIO test strip USE TO CHECK BLOOD SUGAR TWO TIMES A DAY 200 strip 3   Current Facility-Administered Medications  Medication Dose Route Frequency Provider Last Rate Last Admin   0.9 %  sodium chloride infusion  500 mL Intravenous Continuous Mansouraty, Netty Starring., MD        Current Outpatient Medications:    aluminum-magnesium hydroxide 200-200 MG/5ML suspension, Take by mouth every 6 (six) hours as needed for indigestion. As needed, Disp: , Rfl:    atorvastatin (LIPITOR) 80 MG tablet, TAKE 1 TABLET BY MOUTH DAILY, Disp: 90 tablet, Rfl: 3   budesonide (ENTOCORT EC) 3 MG 24 hr capsule, Take 2 capsules (6 mg total) by mouth daily., Disp: 60 capsule, Rfl: 12   colesevelam (WELCHOL) 625 MG tablet, TAKE 3 TABLETS BY MOUTH 2 TIMES DAILY WITH A MEAL., Disp: 540 tablet, Rfl: 3   ELIQUIS 2.5 MG TABS tablet, TAKE 1 TABLET BY MOUTH 2 TIMES DAILY., Disp: 180 tablet, Rfl: 0   ferrous gluconate (FERGON) 324 MG tablet, Take 1 tablet (324 mg total) by mouth daily with breakfast., Disp: 90 tablet, Rfl: 1   gabapentin (NEURONTIN) 100 MG capsule, TAKE 1 CAPSULE BY MOUTH UP TO 3 TIMES DAILY AS NEEDED., Disp: 270 capsule, Rfl: 3   gabapentin (NEURONTIN) 300 MG capsule, Take 2 capsules (600 mg total) by mouth at bedtime., Disp: 180 capsule, Rfl: 1   insulin glargine (LANTUS SOLOSTAR) 100 UNIT/ML Solostar Pen, Inject 14 Units into the skin daily., Disp: 15 mL, Rfl: 3   omeprazole (PRILOSEC) 20 MG capsule, Take 20 mg by mouth daily  with supper., Disp: , Rfl:    potassium chloride SA (KLOR-CON M) 20 MEQ tablet, TAKE 1 TABLET BY MOUTH DAILY., Disp: 90 tablet, Rfl: 3   repaglinide (PRANDIN) 0.5 MG tablet, Take 2 tablets (1 mg) by mouth daily before lunch AND 1 tablet (0.5 mg) daily before supper, Disp: , Rfl:    Ubrogepant (UBRELVY) 100 MG  TABS, Take 1 tablet (100 mg total) by mouth as needed (May repeat dose after 2 hours if needed.  Maximum 2 tablets in 24 hours)., Disp: 10 tablet, Rfl: 3   vitamin B-12 (CYANOCOBALAMIN) 1000 MCG tablet, Take 1,000 mcg by mouth daily with breakfast., Disp: , Rfl:    Vitamin D, Ergocalciferol, (DRISDOL) 1.25 MG (50000 UNIT) CAPS capsule, TAKE 1 CAPSULE BY MOUTH ONCE A WEEK., Disp: 12 capsule, Rfl: 3   Blood Glucose Monitoring Suppl (ONETOUCH VERIO REFLECT) w/Device KIT, TEST TWICE A DAY, Disp: 1 kit, Rfl: 0   Continuous Blood Gluc Sensor (DEXCOM G7 SENSOR) MISC, 1 Device by Does not apply route as directed., Disp: 9 each, Rfl: 3   Eptinezumab-jjmr (VYEPTI) 100 MG/ML injection, Inject 300 mg into the vein every 3 (three) months., Disp: , Rfl:    Insulin Pen Needle 32G X 4 MM MISC, by Does not apply route., Disp: , Rfl:    Lancets (ONETOUCH DELICA PLUS LANCET30G) MISC, USE TO CHECK BLOOD SUGAR TWO TIMES DAILY, Disp: 200 each, Rfl: 3   meclizine (ANTIVERT) 25 MG tablet, Take 1 tablet (25 mg total) by mouth 3 (three) times daily as needed for dizziness., Disp: 30 tablet, Rfl: 1   ondansetron (ZOFRAN) 4 MG tablet, Take 1 tablet (4 mg total) by mouth every 8 (eight) hours as needed for nausea or vomiting., Disp: 20 tablet, Rfl: 6   ONETOUCH VERIO test strip, USE TO CHECK BLOOD SUGAR TWO TIMES A DAY, Disp: 200 strip, Rfl: 3  Current Facility-Administered Medications:    0.9 %  sodium chloride infusion, 500 mL, Intravenous, Continuous, Mansouraty, Netty Starring., MD Allergies  Allergen Reactions   Sudafed [Pseudoephedrine Hcl] Hives, Itching and Anxiety   Atenolol Other (See Comments)    Migraine    Codeine Itching and Swelling    REACTION: Migraine   Epinephrine Hives   Hydrocodone Other (See Comments)    Migraine   Ibuprofen Other (See Comments)    Migraine   Jardiance [Empagliflozin] Other (See Comments)    Caused uti Yeast infection   Metronidazole Swelling   Metronidazole And Related Swelling   Oxycodone Other (See Comments)    Migraine   Penicillins Itching and Swelling    ** Tolerates cephalosporins Has patient had a PCN reaction causing immediate rash, facial/tongue/throat swelling, SOB or lightheadedness with hypotension: No Has patient had a PCN reaction causing severe rash involving mucus membranes or skin necrosis: No Has patient had a PCN reaction that required hospitalization: No Has patient had a PCN reaction occurring within the last 10 years: No     Pentazocine Lactate     REACTION: Swelling, itching, rash   Promethazine Other (See Comments)    migraine   Propranolol Other (See Comments)    Migraine   Talwin [Pentazocine] Other (See Comments)    Swelling and itching    Tramadol Other (See Comments)    Migraine   Vancomycin Swelling    Swelling of Lips and Mouth   Crestor [Rosuvastatin] Other (See Comments)    Muscle pain   Family History  Problem Relation Age of Onset   Bone cancer Mother    Hypertension Father    Mitral valve prolapse Father    Asthma Brother    Arthritis Brother    Nephrolithiasis Brother    Asthma Brother    COPD Brother    Breast cancer Maternal Aunt    Breast cancer Maternal Aunt    Colon cancer Neg Hx    Esophageal  cancer Neg Hx    Inflammatory bowel disease Neg Hx    Liver disease Neg Hx    Pancreatic cancer Neg Hx    Rectal cancer Neg Hx    Stomach cancer Neg Hx    Social History   Socioeconomic History   Marital status: Married    Spouse name: Channing Mutters   Number of children: 0   Years of education: Not on file   Highest education level: Bachelor's degree (e.g., BA, AB, BS)  Occupational History   Occupation:  retired Freight forwarder: retired  Tobacco Use   Smoking status: Former    Current packs/day: 0.00    Average packs/day: 1 pack/day for 13.0 years (13.0 ttl pk-yrs)    Types: Cigarettes    Start date: 06/07/1978    Quit date: 06/08/1991    Years since quitting: 31.7    Passive exposure: Never   Smokeless tobacco: Never  Vaping Use   Vaping status: Never Used  Substance and Sexual Activity   Alcohol use: No   Drug use: No   Sexual activity: Not on file  Other Topics Concern   Not on file  Social History Narrative   Regular exercise--yes, recumbent bike 3-4 days a week      Diet: fruits and veggies, water, eats at home, drinks a lot of milk      Patient is right-handed. She drinks 1-2 cups of coffee a day.      One story home   Social Determinants of Health   Financial Resource Strain: Low Risk  (09/13/2022)   Overall Financial Resource Strain (CARDIA)    Difficulty of Paying Living Expenses: Not hard at all  Food Insecurity: No Food Insecurity (09/13/2022)   Hunger Vital Sign    Worried About Running Out of Food in the Last Year: Never true    Ran Out of Food in the Last Year: Never true  Transportation Needs: No Transportation Needs (09/13/2022)   PRAPARE - Administrator, Civil Service (Medical): No    Lack of Transportation (Non-Medical): No  Physical Activity: Insufficiently Active (09/13/2022)   Exercise Vital Sign    Days of Exercise per Week: 4 days    Minutes of Exercise per Session: 30 min  Stress: No Stress Concern Present (09/13/2022)   [employer] of Occupational Health - Occupational Stress Questionnaire    Feeling of Stress : Not at all  Social Connections: Moderately Isolated (09/13/2022)   Social Connection and Isolation Panel [NHANES]    Frequency of Communication with Friends and Family: Twice a week    Frequency of Social Gatherings with Friends and Family: Three times a week    Attends Religious Services: Never    Active Member of  Clubs or Organizations: No    Attends Banker Meetings: Never    Marital Status: Married  Catering manager Violence: Not At Risk (09/13/2022)   Humiliation, Afraid, Rape, and Kick questionnaire    Fear of Current or Ex-Partner: No    Emotionally Abused: No    Physically Abused: No    Sexually Abused: No    Physical Exam: Today's Vitals   02/11/23 0719 02/11/23 0727  BP: (!) 163/73   Pulse: 81   Temp: (!) 96.2 F (35.7 C) (!) 96.2 F (35.7 C)  SpO2: 97%   Weight: 132 lb (59.9 kg)   Height: 5' (1.524 m)    Body mass index is 25.78 kg/m. GEN: NAD EYE: Sclerae anicteric  ENT: MMM CV: Non-tachycardic GI: Soft, NT/ND NEURO:  Alert & Oriented x 3  Lab Results: Recent Labs    02/09/23 1041  WBC 9.4  HGB 14.0  HCT 43.9  PLT 299.0   BMET Recent Labs    02/09/23 1041  NA 141  K 3.6  CL 104  CO2 30  GLUCOSE 210*  BUN 17  CREATININE 0.93  CALCIUM 9.2   LFT Recent Labs    02/09/23 1041  PROT 6.9  ALBUMIN 3.5  AST 16  ALT 26  ALKPHOS 68  BILITOT 0.4   PT/INR No results for input(s): "LABPROT", "INR" in the last 72 hours.   Impression / Plan: This is a 72 y.o.female who presents for EGD for evaluation of Dysphagia.  The risks and benefits of endoscopic evaluation/treatment were discussed with the patient and/or family; these include but are not limited to the risk of perforation, infection, bleeding, missed lesions, lack of diagnosis, severe illness requiring hospitalization, as well as anesthesia and sedation related illnesses.  The patient's history has been reviewed, patient examined, no change in status, and deemed stable for procedure.  The patient and/or family is agreeable to proceed.    Corliss Parish, MD [facility] Office # [phone]

## 2023-02-11 NOTE — Patient Instructions (Addendum)
  Dilation diet - handout provided Use Cepacol or Halls +/- Chloraseptic spray for next 72-96 hrs to aid sore throat should you experience this Restart Eliquis on 02/13/2023   YOU HAD AN ENDOSCOPIC PROCEDURE TODAY: Refer to the procedure report and other information in the discharge instructions given to you for any specific questions about what was found during the examination. If this information does not answer your questions, please call [facility] office at [phone] to clarify.    YOU SHOULD EXPECT: Some feelings of bloating in the abdomen. Passage of more gas than usual. Walking can help get rid of the air that was put into your GI tract during the procedure and reduce the bloating. If you had a lower endoscopy (such as a colonoscopy or flexible sigmoidoscopy) you may notice spotting of blood in your stool or on the toilet paper. Some abdominal soreness may be present for a day or two, also.  DIET: Your first meal following the procedure should be a light meal and then it is ok to progress to your normal diet. A half-sandwich or bowl of soup is an example of a good first meal. Heavy or fried foods are harder to digest and may make you feel nauseous or bloated. Drink plenty of fluids but you should avoid alcoholic beverages for 24 hours. If you had a esophageal dilation, please see attached instructions for diet.    ACTIVITY: Your care partner should take you home directly after the procedure. You should plan to take it easy, moving slowly for the rest of the day. You can resume normal activity the day after the procedure however YOU SHOULD NOT DRIVE, use power tools, machinery or perform tasks that involve climbing or major physical exertion for 24 hours (because of the sedation medicines used during the test).   SYMPTOMS TO REPORT IMMEDIATELY: A gastroenterologist can be reached at any hour. Please call [phone]  for any of the following symptoms:  Following upper endoscopy (EGD, EUS, ERCP,  esophageal dilation) Vomiting of blood or coffee ground material  New, significant abdominal pain  New, significant chest pain or pain under the shoulder blades  Painful or persistently difficult swallowing  New shortness of breath  Black, tarry-looking or red, bloody stools  FOLLOW UP:  If any biopsies were taken you will be contacted by phone or by letter within the next 1-3 weeks. Call [phone]  if you have not heard about the biopsies in 3 weeks.  Please also call with any specific questions about appointments or follow up tests.

## 2023-02-11 NOTE — Progress Notes (Signed)
Called to room to assist during endoscopic procedure.  Patient ID and intended procedure confirmed with present staff. Received instructions for my participation in the procedure from the performing physician.  

## 2023-02-13 ENCOUNTER — Encounter: Payer: Self-pay | Admitting: Gastroenterology

## 2023-02-13 DIAGNOSIS — Z7901 Long term (current) use of anticoagulants: Secondary | ICD-10-CM | POA: Insufficient documentation

## 2023-02-13 DIAGNOSIS — Z8619 Personal history of other infectious and parasitic diseases: Secondary | ICD-10-CM | POA: Insufficient documentation

## 2023-02-13 DIAGNOSIS — R1310 Dysphagia, unspecified: Secondary | ICD-10-CM | POA: Insufficient documentation

## 2023-02-14 ENCOUNTER — Telehealth: Payer: Self-pay | Admitting: *Deleted

## 2023-02-14 NOTE — Progress Notes (Unsigned)
In   NEUROLOGY FOLLOW UP OFFICE NOTE  Debra Leach [id number]  Assessment/Plan:   Episodic vertigo.  Semiology suspicious for BPPV but vestibular testing suggestive of central etiology.  Unclear etiology but may be vestibular migraine Chronic migraine without aura, without status migrainosus, not intractable - worse over past 6 months related to other health problems but still overall improved compared to prior to starting Vyepti Ocular migraine     Meclizine to take as needed for vertigo while on trip in [place]. Migraine prevention:  Vyepti 300mg  Q12wks.   gabapentin 600mg  at bedtime Migraine rescue:  Ubrelvy 100mg ; gabapentin 100mg  for less severe Limit use of pain relievers to no more than 2 days out of week to prevent risk of rebound or medication-overuse headache. Keep headache diary Follow up 6 to 7 months     Subjective:  Debra Leach is a 72 year old right-handed female with autoimmune pancreatitis, migraines, hyperlipidemia, diabetes, GERD, osteoarthritis, and history of MVP and renal stones who follows up for dizziness.     UPDATE:  She has been doing well except for in April-May.  In April and May averaged 16-17% migraine free days and 83% migraine days.  In June through August, averaged 52-65% migraine days and 35-47% migraine free days.  However, most migraines were tolerable (2-3/10 severity).]  Vertigo has mostly resolved.  Still does home exercises.  Going to [place] on Friday.  She has meclizine on-hand if she has recurrence of vertigo.   April-May (16-17% free, 83% mig) Jun-Aug 52-65% migraine days) 35-47% free   most days are not severe 2-3/10  Rescue protocol:  For moderate - gabapentin 100mg , for severeBernita Leach Current NSAIDS/steroid: budesomide Current analgesics: None Current triptans: None Current ergotamine: None Current anti-emetic: Promethazine 12.5 mg Current muscle relaxants: None Current anti-anxiolytic: None Current sleep aide:  None Current Antihypertensive medications: losartan-HCTZ Current Antidepressant medications: None Current Anticonvulsant medications: Gabapentin 600mg  at bedtime Current anti-CGRP: Vyepti 300mg ; Ubrelvy Current Vitamins/Herbal/Supplements: Riboflavin 400 mg daily, vitamin D Current Antihistamines/Decongestants: meclizine Other therapy: Cefaly, ice pack Other medication: Meclizine   Caffeine: 1 cup of coffee daily Alcohol: None Smoker: No Diet: Follows [employer] ImmunoCalm Dietary Program.  Hydrates. Exercise: When tolerated (knee problems).  She walks and bikes. Depression: No; Anxiety: No Other pain:  no Sleep hygiene: Improved.  Sleeps better now than every before. 100mg  during day as needed   HISTORY: Migraines: Onset: 72 years old Location:  Varies (unilateral either side, frontal-temporal, back of head) Quality:  Varies (stabbing, pounding, throbbing) Initial Intensity:  10/10 severe, otherwise 5-7/10 Aura:  Occurs off and on over the years and varies in semiology.  In early 1970s, black out.  In late 1980s, white out.  In late 1990s, flashing lights and zigzag lines.  Since 2016, scintillating scotoma (occurs 1 to 2 times a month) Prodrome:  no Associated symptoms: Initially vomiting.  Sometimes nausea.  Photophobia, phonophobia.  Dizziness, speech difficulty. Initial Duration:  All day Initial Frequency:  daily Triggers: Change in weather, emotional stress, valsalva maneuver, odors, certain foods Relieving factors: ice, rest Activity:  Difficult to function if severe  She has been to the ED for migraines with speech disturbance.  Workup in March 2024 included MRI of brain without contrast and CTA head and neck which were unremarkable.     Past NSAIDS:  diclofenac 50mg , Cambia, Mobic 15mg , ibuprofen, naproxen, toradol Past analgesics:  tramadol (reaction), Midrin, Excedrin Past abortive triptans/ergots:  Treximet, Axert, Amerge, Frova, Maxalt, Relpax, Zomig tablet, DHE NS,  sumatriptan 100mg ;  sumatriptan 6mg  [place] Past anti-emetic:  Zofran 4mg  Past anxiolytic:  Buspirone, clonazepam Past antihypertensive medications:  Metoprolol, Norvasc, propranolol ER 120mg , atenolol 100mg  (side effects dizziness, myalgias, acid reflux) Past antidepressant medications:  Venlafaxine, sertraline, amitriptyline, amoxapine, duloxetine, nortriptyline, imipramine, Luvox, Remeron, Serzone, bupropion, desipramine, doxepin, Vivactil Past anticonvulsant medications:  Depakote, Topamax, zonisamide, Lamictal, Keppra, Lyrica, gabapentin 300/300/600  Past anti--CGRP: Aimovig, Emgality Past vitamins/Herbal/Supplements:  butterbur, Mg, CoQ10 Other past treatments:  Botox, Cefaly Device, methylergonovine, acupuncture, biofeedback, Seroquel, Thorazine, methylsergide maleate   Family history of headache:  Maybe her mother's side.   Prior brain MRI normal (date unknown but report is mentioned in prior notes).   DIzziness: Began having dizziness in early 2022.  No preceding head injury or new medication.  Reports episodic dizziness described as a wave of lightheadedness or sensation of stepping off of a boat, but not spinning.  It occurs with any movement but also can occur spontaneously.  Typically lasts 15 seconds but occurs all day long.  It doesn't occur while laying in bed, such as turning over.  No headache but feels like her head is floating.  No visual disturbance.  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  It does not appear to correlate with a migraine flare.  She reports history of mild orthostatic hypotension when getting up too fast, but not experiencing that with this and reportedly orthostatic vitals were negative when checked by her PCP.  Flonase didn't help, however meclizine is effective.  She followed up with ENT.  Dix-Hallpike was negative.  VNG showed abnormal smooth pursuit, saccades and  optokinetic testing - central pathology cannot be ruled out.  She has bilateral sensorineural hearing loss.  Hearing aids were recommended.  Underwent workup for possible central dizziness.  MRI and MRA of brain on 12/23/2020 were unremarkable.  Vestibular rehab effective.   Abnormal Movements: In December 2017, we started atenolol for migraine prevention.  It worked well, but due to reported side effects of dizziness, acid reflux and joint and muscle pain, she was tapered off of it in early April.  Around that time, she developed shaking episodes or tremors associated with her migraines.  She followed up with her PCP, Dr. Ermalene Searing, on 10/08/16 and we had her discontinue the gabapentin.  She hasn't had any recurrent tremors, however she says she has gone up to 4 days at a time without an attack.  Since discontinuing the gabapentin, the intensity of her daily headaches have gotten worse.     I reviewed the video of her habitual attacks.  In the video, she exhibits flapping of her right hand, arrhythmic and without tremor or choreiform movement.  It is not an arrhythmic jerking, consistent with myoclonus.  It sometimes involves the left hand as well.  She denies stress and anxiety.  Episodic Right Monocular Vision Loss: In April 2023, she began having episodes of monocular dimming of vision in the right eye lasting 15 minutes.  No associated headache.  Different than her previous ocular migraines presenting as pixel vision.  Saw her ophthalmologist and eye exam was normal.  Sed rate on 5/12 was 102 and repeat on 5/17 was 130.  She was hospitalized on 11/02/2021 for syncope in setting of AKI and flu.  She was placed on 5 day course of prednsione.  Sed rate on day of admission was 60.  Sed rate in May 2023 was over 130.  Carotid ultrasound  on 6/15 was negative for hemodynamically significant stenosis.  Underwent temporal artery biopsy on 6/29 which was negative.  Repeat eye exam on 12/09/2021 was normal.  Possible  ocular migraine?  PAST MEDICAL HISTORY: 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HCC)    Clotting disorder (HCC)    Complication of anesthesia    difficult waking   Diabetes mellitus without complication (HCC)    Diet and exercise controlled   Diverticulosis    DVT (deep venous thrombosis) (HCC) 02/2019   3calf 2 right lung 1 left lung   DVT (deep venous thrombosis) (HCC) 2020   right leg   Dyspnea    GERD (gastroesophageal reflux disease)    Headache    Hepatic steatosis    Hiatal hernia    History of kidney stones    Hyperlipidemia    Hypertension    06/29/22 has not had to take blood pressure meds in over 1 year.   MVP (mitral valve prolapse)    history of   PONV (postoperative nausea and vomiting)    Pulmonary emboli (HCC) 03/01/2019   2 in the right; 1 in the left    MEDICATIONS: Current Outpatient Medications on File Prior to Visit  Medication Sig Dispense Refill   aluminum-magnesium hydroxide 200-200 MG/5ML suspension Take by mouth every 6 (six) hours as needed for indigestion. As needed     atorvastatin (LIPITOR) 80 MG tablet TAKE 1 TABLET BY MOUTH DAILY 90 tablet 3   Blood Glucose Monitoring Suppl (ONETOUCH VERIO REFLECT) w/Device KIT TEST TWICE A DAY 1 kit 0   budesonide (ENTOCORT EC) 3 MG 24 hr capsule Take 2 capsules (6 mg total) by mouth daily. 60 capsule 12   colesevelam (WELCHOL) 625 MG tablet TAKE 3 TABLETS BY MOUTH 2 TIMES DAILY WITH A MEAL. 540 tablet 3   Continuous Blood Gluc Sensor (DEXCOM G7 SENSOR) MISC 1 Device by Does not apply route as directed. 9 each 3   ELIQUIS 2.5 MG TABS tablet TAKE 1 TABLET BY MOUTH 2 TIMES DAILY. 180 tablet 0   Eptinezumab-jjmr (VYEPTI) 100 MG/ML injection Inject 300 mg into the vein every 3 (three) months.     ferrous gluconate (FERGON) 324 MG tablet Take 1 tablet (324 mg total) by mouth daily with breakfast. 90 tablet 1    gabapentin (NEURONTIN) 100 MG capsule TAKE 1 CAPSULE BY MOUTH UP TO 3 TIMES DAILY AS NEEDED. 270 capsule 3   gabapentin (NEURONTIN) 300 MG capsule Take 2 capsules (600 mg total) by mouth at bedtime. 180 capsule 1   insulin glargine (LANTUS SOLOSTAR) 100 UNIT/ML Solostar Pen Inject 14 Units into the skin daily. 15 mL 3   Insulin Pen Needle 32G X 4 MM MISC by Does not apply route.     Lancets (ONETOUCH DELICA PLUS LANCET30G) MISC USE TO CHECK BLOOD SUGAR TWO TIMES DAILY 200 each 3   meclizine (ANTIVERT) 25 MG tablet Take 1 tablet (25 mg total) by mouth 3 (three) times daily as needed for dizziness. 30 tablet 1   omeprazole (PRILOSEC) 20 MG capsule Take 20 mg by mouth daily with supper.     ondansetron (ZOFRAN) 4 MG tablet Take 1 tablet (4 mg total) by mouth every 8 (eight) hours as needed for nausea or vomiting. 20 tablet 6   ONETOUCH VERIO test strip USE TO CHECK BLOOD SUGAR TWO TIMES A DAY  200 strip 3   potassium chloride SA (KLOR-CON M) 20 MEQ tablet TAKE 1 TABLET BY MOUTH DAILY. 90 tablet 3   repaglinide (PRANDIN) 0.5 MG tablet Take 2 tablets (1 mg) by mouth daily before lunch AND 1 tablet (0.5 mg) daily before supper     Ubrogepant (UBRELVY) 100 MG TABS Take 1 tablet (100 mg total) by mouth as needed (May repeat dose after 2 hours if needed.  Maximum 2 tablets in 24 hours). 10 tablet 3   vitamin B-12 (CYANOCOBALAMIN) 1000 MCG tablet Take 1,000 mcg by mouth daily with breakfast.     Vitamin D, Ergocalciferol, (DRISDOL) 1.25 MG (50000 UNIT) CAPS capsule TAKE 1 CAPSULE BY MOUTH ONCE A WEEK. 12 capsule 3   No current facility-administered medications on file prior to visit.    ALLERGIES: Allergies  Allergen Reactions   Sudafed [Pseudoephedrine Hcl] Hives, Itching and Anxiety   Atenolol Other (See Comments)    Migraine   Codeine Itching and Swelling    REACTION: Migraine   Epinephrine Hives   Hydrocodone Other (See Comments)    Migraine   Ibuprofen Other (See Comments)    Migraine    Jardiance [Empagliflozin] Other (See Comments)    Caused uti Yeast infection   Metronidazole Swelling   Metronidazole And Related Swelling   Oxycodone Other (See Comments)    Migraine   Penicillins Itching and Swelling    ** Tolerates cephalosporins Has patient had a PCN reaction causing immediate rash, facial/tongue/throat swelling, SOB or lightheadedness with hypotension: No Has patient had a PCN reaction causing severe rash involving mucus membranes or skin necrosis: No Has patient had a PCN reaction that required hospitalization: No Has patient had a PCN reaction occurring within the last 10 years: No     Pentazocine Lactate     REACTION: Swelling, itching, rash   Promethazine Other (See Comments)    migraine   Propranolol Other (See Comments)    Migraine   Talwin [Pentazocine] Other (See Comments)    Swelling and itching    Tramadol Other (See Comments)    Migraine   Vancomycin Swelling    Swelling of Lips and Mouth   Crestor [Rosuvastatin] Other (See Comments)    Muscle pain    FAMILY HISTORY: Family History  Problem Relation Age of Onset   Bone cancer Mother    Hypertension Father    Mitral valve prolapse Father    Asthma Brother    Arthritis Brother    Nephrolithiasis Brother    Asthma Brother    COPD Brother    Breast cancer Maternal Aunt    Breast cancer Maternal Aunt    Colon cancer Neg Hx    Esophageal cancer Neg Hx    Inflammatory bowel disease Neg Hx    Liver disease Neg Hx    Pancreatic cancer Neg Hx    Rectal cancer Neg Hx    Stomach cancer Neg Hx       Objective:  Blood pressure (!) 152/72, pulse 75, height 5' (1.524 m), weight 133 lb (60.3 kg), SpO2 98%. General: No acute distress.  Patient appears well-groomed.        Shon Millet, DO  CC: Kerby Nora, MD

## 2023-02-14 NOTE — Telephone Encounter (Signed)
  Follow up Call-     02/11/2023    7:27 AM  Call back number  Post procedure Call Back phone  # [phone]  Permission to leave phone message Yes     Patient questions:  Do you have a fever, pain , or abdominal swelling? No. Pain Score  0 *- pt c/o mild soreness in her throat/esophagus d/t dilation. States it has been improving all weekend an she is not concerned at this time.  Have you tolerated food without any problems? Yes.    Have you been able to return to your normal activities? Yes.    Do you have any questions about your discharge instructions: Diet   No. Medications  No. Follow up visit  No.  Do you have questions or concerns about your Care? No.  Actions: * If pain score is 4 or above: No action needed, pain <4.

## 2023-02-15 ENCOUNTER — Ambulatory Visit (INDEPENDENT_AMBULATORY_CARE_PROVIDER_SITE_OTHER): Payer: Medicare Other | Admitting: Neurology

## 2023-02-15 ENCOUNTER — Encounter: Payer: Self-pay | Admitting: Neurology

## 2023-02-15 ENCOUNTER — Encounter: Payer: Self-pay | Admitting: Gastroenterology

## 2023-02-15 VITALS — BP 152/72 | HR 75 | Ht 60.0 in | Wt 133.0 lb

## 2023-02-15 DIAGNOSIS — G43109 Migraine with aura, not intractable, without status migrainosus: Secondary | ICD-10-CM | POA: Diagnosis not present

## 2023-02-15 DIAGNOSIS — R42 Dizziness and giddiness: Secondary | ICD-10-CM | POA: Diagnosis not present

## 2023-02-15 DIAGNOSIS — G43709 Chronic migraine without aura, not intractable, without status migrainosus: Secondary | ICD-10-CM | POA: Diagnosis not present

## 2023-02-15 LAB — CALPROTECTIN, FECAL: Calprotectin, Fecal: 99 ug/g (ref 0–120)

## 2023-02-15 NOTE — Patient Instructions (Addendum)
Have fun in [place]!  Have meclizine on hand if needed Follow  up 6 to 7 months.

## 2023-02-16 ENCOUNTER — Other Ambulatory Visit: Payer: Self-pay

## 2023-02-16 DIAGNOSIS — R634 Abnormal weight loss: Secondary | ICD-10-CM

## 2023-02-16 DIAGNOSIS — K861 Other chronic pancreatitis: Secondary | ICD-10-CM

## 2023-02-16 DIAGNOSIS — R197 Diarrhea, unspecified: Secondary | ICD-10-CM

## 2023-03-04 ENCOUNTER — Telehealth: Payer: Self-pay | Admitting: Family Medicine

## 2023-03-04 NOTE — Telephone Encounter (Signed)
[facility] contacted the office asking if we received surgical clearance/ instructions form for patient. Could not find in [address] or patient's chart, advised that I did not see it and asked if they could re send it. Do we have this form? Caller re sent form via fax just in case.

## 2023-03-04 NOTE — Telephone Encounter (Signed)
Yes we had already received this form.  It is in my basket already.  Lupita Leash had tried to contact patient on February 25, 2023 to request an appointment for surgical preop.  She had to leave message on the machine to call back. Please contact patient.

## 2023-03-07 NOTE — Telephone Encounter (Signed)
Left message to return call to our office.  

## 2023-03-07 NOTE — Telephone Encounter (Signed)
Spoke to pt, relayed Dr. Daphine Deutscher response. Pt stated she scheduled an appt to see Dr. Ermalene Searing on last Sat, via mychart. Ov scheduled for tomorrow, 10/1. Call back # [phone]

## 2023-03-08 ENCOUNTER — Ambulatory Visit: Payer: Medicare Other | Admitting: Family Medicine

## 2023-03-09 ENCOUNTER — Encounter: Payer: Self-pay | Admitting: Internal Medicine

## 2023-03-09 ENCOUNTER — Ambulatory Visit (INDEPENDENT_AMBULATORY_CARE_PROVIDER_SITE_OTHER): Payer: Medicare Other | Admitting: Internal Medicine

## 2023-03-09 VITALS — BP 138/84 | HR 69 | Temp 99.1°F | Ht 60.0 in | Wt 132.0 lb

## 2023-03-09 DIAGNOSIS — Z86711 Personal history of pulmonary embolism: Secondary | ICD-10-CM | POA: Diagnosis not present

## 2023-03-09 NOTE — Assessment & Plan Note (Signed)
Multiple and unprovoked in the past (2020) Decision for long term low dose eliquis 2.5 bid  No problem with holding for minor surgery under MAC 1 day is adequate--but okay to extend to 2 days if surgeon feels strongly about this

## 2023-03-09 NOTE — Progress Notes (Signed)
Subjective:    Patient ID: Debra Leach, female    DOB: February 28, 1951, 72 y.o.   MRN: [id number]  HPI Here for pre-op evaluation prior to finger surgery with MAC  Had similar procedure on right hand earlier in the year Now planning to do this at the end of the month on the left hand  No chest pain No SOB No recent illness since sepsis in March Then had C diff from the antibiotics  Past pulmonary emboli in both lungs-several This happened in 2020 ---and considered unprovoked Now on low dose for ongoing care  Current Outpatient Medications on File Prior to Visit  Medication Sig Dispense Refill   aluminum-magnesium hydroxide 200-200 MG/5ML suspension Take by mouth every 6 (six) hours as needed for indigestion. As needed     atorvastatin (LIPITOR) 80 MG tablet TAKE 1 TABLET BY MOUTH DAILY 90 tablet 3   Blood Glucose Monitoring Suppl (ONETOUCH VERIO REFLECT) w/Device KIT TEST TWICE A DAY 1 kit 0   budesonide (ENTOCORT EC) 3 MG 24 hr capsule Take 2 capsules (6 mg total) by mouth daily. 60 capsule 12   colesevelam (WELCHOL) 625 MG tablet TAKE 3 TABLETS BY MOUTH 2 TIMES DAILY WITH A MEAL. 540 tablet 3   Continuous Blood Gluc Sensor (DEXCOM G7 SENSOR) MISC 1 Device by Does not apply route as directed. 9 each 3   ELIQUIS 2.5 MG TABS tablet TAKE 1 TABLET BY MOUTH 2 TIMES DAILY. 180 tablet 0   Eptinezumab-jjmr (VYEPTI) 100 MG/ML injection Inject 300 mg into the vein every 3 (three) months.     ferrous gluconate (FERGON) 324 MG tablet Take 1 tablet (324 mg total) by mouth daily with breakfast. 90 tablet 1   gabapentin (NEURONTIN) 100 MG capsule TAKE 1 CAPSULE BY MOUTH UP TO 3 TIMES DAILY AS NEEDED. 270 capsule 3   gabapentin (NEURONTIN) 300 MG capsule Take 2 capsules (600 mg total) by mouth at bedtime. 180 capsule 1   insulin glargine (LANTUS SOLOSTAR) 100 UNIT/ML Solostar Pen Inject 14 Units into the skin daily. 15 mL 3   Insulin Pen Needle 32G X 4 MM MISC by Does not apply route.      Lancets (ONETOUCH DELICA PLUS LANCET30G) MISC USE TO CHECK BLOOD SUGAR TWO TIMES DAILY 200 each 3   meclizine (ANTIVERT) 25 MG tablet Take 1 tablet (25 mg total) by mouth 3 (three) times daily as needed for dizziness. 30 tablet 1   omeprazole (PRILOSEC) 20 MG capsule Take 20 mg by mouth daily with supper.     ondansetron (ZOFRAN) 4 MG tablet Take 1 tablet (4 mg total) by mouth every 8 (eight) hours as needed for nausea or vomiting. 20 tablet 6   ONETOUCH VERIO test strip USE TO CHECK BLOOD SUGAR TWO TIMES A DAY 200 strip 3   potassium chloride SA (KLOR-CON M) 20 MEQ tablet TAKE 1 TABLET BY MOUTH DAILY. 90 tablet 3   repaglinide (PRANDIN) 0.5 MG tablet Take 2 tablets (1 mg) by mouth daily before lunch AND 1 tablet (0.5 mg) daily before supper     Ubrogepant (UBRELVY) 100 MG TABS Take 1 tablet (100 mg total) by mouth as needed (May repeat dose after 2 hours if needed.  Maximum 2 tablets in 24 hours). 10 tablet 3   vitamin B-12 (CYANOCOBALAMIN) 1000 MCG tablet Take 1,000 mcg by mouth daily with breakfast.     Vitamin D, Ergocalciferol, (DRISDOL) 1.25 MG (50000 UNIT) CAPS capsule TAKE 1 CAPSULE BY MOUTH ONCE  A WEEK. 12 capsule 3   No current facility-administered medications on file prior to visit.    Allergies  Allergen Reactions   Sudafed [Pseudoephedrine Hcl] Hives, Itching and Anxiety   Atenolol Other (See Comments)    Migraine   Codeine Itching and Swelling    REACTION: Migraine   Epinephrine Hives   Hydrocodone Other (See Comments)    Migraine   Ibuprofen Other (See Comments)    Migraine   Jardiance [Empagliflozin] Other (See Comments)    Caused uti Yeast infection   Metronidazole Swelling   Metronidazole And Related Swelling   Oxycodone Other (See Comments)    Migraine   Penicillins Itching and Swelling    ** Tolerates cephalosporins Has patient had a PCN reaction causing immediate rash, facial/tongue/throat swelling, SOB or lightheadedness with hypotension: No Has patient had  a PCN reaction causing severe rash involving mucus membranes or skin necrosis: No Has patient had a PCN reaction that required hospitalization: No Has patient had a PCN reaction occurring within the last 10 years: No     Pentazocine Lactate     REACTION: Swelling, itching, rash   Promethazine Other (See Comments)    migraine   Propranolol Other (See Comments)    Migraine   Talwin [Pentazocine] Other (See Comments)    Swelling and itching    Tramadol Other (See Comments)    Migraine   Vancomycin Swelling    Swelling of Lips and Mouth   Crestor [Rosuvastatin] Other (See Comments)    Muscle pain    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HCC)    Clotting disorder (HCC)    Complication of anesthesia    difficult waking   Diabetes mellitus without complication (HCC)    Diet and exercise controlled   Diverticulosis    DVT (deep venous thrombosis) (HCC) 02/2019   3calf 2 right lung 1 left lung   DVT (deep venous thrombosis) (HCC) 2020   right leg   Dyspnea    GERD (gastroesophageal reflux disease)    Headache    Hepatic steatosis    Hiatal hernia    History of kidney stones    Hyperlipidemia    Hypertension    06/29/22 has not had to take blood pressure meds in over 1 year.   MVP (mitral valve prolapse)    history of   PONV (postoperative nausea and vomiting)    Pulmonary emboli (HCC) 03/01/2019   2 in the right; 1 in the left    Past Surgical History:  Procedure Laterality Date   arm surgery  2012   left forearm fracture w/ plates pins screws   ARTERY BIOPSY Right 12/03/2021   Procedure: BIOPSY TEMPORAL ARTERY;  Surgeon: Annice Needy, MD;  Location: [facility] ORS;  Service: Vascular;  Laterality: Right;  Right temple   BREAST BIOPSY Left    BREAST SURGERY  2000   breast biopsy (benign)   CATARACT EXTRACTION Right 2005   CATARACT EXTRACTION Left 2010   LITHOTRIPSY   08/12/2008   stent placed bilaterally   PANCREAS BIOPSY  2021   PANCREAS BIOPSY  2019   PARTIAL HYSTERECTOMY     Both ovaries remain, vaginal, for mennorhagia   REPAIR EXTENSOR TENDON Right 06/30/2022   Procedure: Middle and ring finger sagittal band reconstruction;  Surgeon: Marlyne Beards, MD;  Location: [facility];  Service: Orthopedics;  Laterality: Right;   TONSILLECTOMY     TUBAL LIGATION  1980    Family History  Problem Relation Age of Onset   Bone cancer Mother    Hypertension Father    Mitral valve prolapse Father    Asthma Brother    Arthritis Brother    Nephrolithiasis Brother    Asthma Brother    COPD Brother    Breast cancer Maternal Aunt    Breast cancer Maternal Aunt    Colon cancer Neg Hx    Esophageal cancer Neg Hx    Inflammatory bowel disease Neg Hx    Liver disease Neg Hx    Pancreatic cancer Neg Hx    Rectal cancer Neg Hx    Stomach cancer Neg Hx     Social History   Socioeconomic History   Marital status: Married    Spouse name: Channing Mutters   Number of children: 0   Years of education: Not on file   Highest education level: Bachelor's degree (e.g., BA, AB, BS)  Occupational History   Occupation: retired Freight forwarder: retired  Tobacco Use   Smoking status: Former    Current packs/day: 0.00    Average packs/day: 1 pack/day for 13.0 years (13.0 ttl pk-yrs)    Types: Cigarettes    Start date: 06/07/1978    Quit date: 06/08/1991    Years since quitting: 31.7    Passive exposure: Never   Smokeless tobacco: Never  Vaping Use   Vaping status: Never Used  Substance and Sexual Activity   Alcohol use: No   Drug use: No   Sexual activity: Not on file  Other Topics Concern   Not on file  Social History Narrative   Regular exercise--yes, recumbent bike 3-4 days a week      Diet: fruits and veggies, water, eats at home, drinks a lot of milk      Patient is right-handed. She drinks 1-2 cups of coffee a day.      One story home   Social  Determinants of Health   Financial Resource Strain: Low Risk  (09/13/2022)   Overall Financial Resource Strain (CARDIA)    Difficulty of Paying Living Expenses: Not hard at all  Food Insecurity: No Food Insecurity (09/13/2022)   Hunger Vital Sign    Worried About Running Out of Food in the Last Year: Never true    Ran Out of Food in the Last Year: Never true  Transportation Needs: No Transportation Needs (09/13/2022)   PRAPARE - Administrator, Civil Service (Medical): No    Lack of Transportation (Non-Medical): No  Physical Activity: Insufficiently Active (09/13/2022)   Exercise Vital Sign    Days of Exercise per Week: 4 days    Minutes of Exercise per Session: 30 min  Stress: No Stress Concern Present (09/13/2022)   [employer] of Occupational Health - Occupational Stress Questionnaire    Feeling of Stress : Not at all  Social Connections: Moderately Isolated (09/13/2022)   Social Connection and Isolation Panel [NHANES]    Frequency of Communication with Friends and Family: Twice a week    Frequency of Social Gatherings with Friends and Family: Three times a week    Attends Religious Services: Never    Active Member of Clubs or Organizations: No    Attends Banker Meetings: Never    Marital Status: Married  Catering manager Violence: Not At Risk (09/13/2022)   Humiliation, Afraid, Rape, and Kick questionnaire  Fear of Current or Ex-Partner: No    Emotionally Abused: No    Physically Abused: No    Sexually Abused: No   Review of Systems No leg swelling--other than after recent trip to [place] (only in evening) No leg redness     Objective:   Physical Exam Constitutional:      Appearance: Normal appearance.  Cardiovascular:     Rate and Rhythm: Normal rate and regular rhythm.     Heart sounds: No murmur heard.    No gallop.  Pulmonary:     Effort: Pulmonary effort is normal.     Breath sounds: Normal breath sounds. No wheezing or rales.   Musculoskeletal:     Cervical back: Neck supple.     Right lower leg: No edema.     Left lower leg: No edema.     Comments: No calf swelling or tenderness  Lymphadenopathy:     Cervical: No cervical adenopathy.  Neurological:     Mental Status: She is alert.            Assessment & Plan:

## 2023-03-10 ENCOUNTER — Ambulatory Visit
Admission: RE | Admit: 2023-03-10 | Discharge: 2023-03-10 | Disposition: A | Discharge status: Home / Self Care | Payer: Medicare Other | Source: Ambulatory Visit

## 2023-03-10 ENCOUNTER — Other Ambulatory Visit: Payer: Self-pay | Admitting: Gastroenterology

## 2023-03-10 DIAGNOSIS — Z1231 Encounter for screening mammogram for malignant neoplasm of breast: Secondary | ICD-10-CM

## 2023-03-28 ENCOUNTER — Encounter (HOSPITAL_BASED_OUTPATIENT_CLINIC_OR_DEPARTMENT_OTHER): Payer: Self-pay | Admitting: Orthopedic Surgery

## 2023-03-31 ENCOUNTER — Encounter (HOSPITAL_BASED_OUTPATIENT_CLINIC_OR_DEPARTMENT_OTHER)
Admission: RE | Admit: 2023-03-31 | Discharge: 2023-03-31 | Disposition: A | Discharge status: Home / Self Care | Payer: Medicare Other | Source: Ambulatory Visit | Attending: Orthopedic Surgery | Admitting: Orthopedic Surgery

## 2023-03-31 DIAGNOSIS — Z01812 Encounter for preprocedural laboratory examination: Secondary | ICD-10-CM | POA: Diagnosis present

## 2023-03-31 LAB — BASIC METABOLIC PANEL
Anion gap: 12 (ref 5–15)
BUN: 11 mg/dL (ref 8–23)
CO2: 24 mmol/L (ref 22–32)
Calcium: 9.2 mg/dL (ref 8.9–10.3)
Chloride: 106 mmol/L (ref 98–111)
Creatinine, Ser: 1 mg/dL (ref 0.44–1.00)
GFR, Estimated: >60 mL/min (ref >60)
Glucose, Bld: 171 mg/dL — ABNORMAL HIGH (ref 70–99)
Potassium: 3.6 mmol/L (ref 3.5–5.1)
Sodium: 142 mmol/L (ref 135–145)

## 2023-03-31 NOTE — Progress Notes (Signed)

## 2023-04-02 ENCOUNTER — Encounter: Payer: Self-pay | Admitting: Neurology

## 2023-04-05 NOTE — Anesthesia Preprocedure Evaluation (Signed)
Anesthesia Evaluation  Patient identified by MRN, date of birth, ID band Patient awake    Reviewed: Allergy & Precautions, NPO status , Patient's Chart, lab work & pertinent test results  History of Anesthesia Complications (+) PONV and history of anesthetic complications  Airway Mallampati: II  TM Distance: >3 FB Neck ROM: Full    Dental  (+) Dental Advisory Given   Pulmonary former smoker, PE   Pulmonary exam normal        Cardiovascular hypertension, + DVT  Normal cardiovascular exam     Neuro/Psych  Headaches  negative psych ROS   GI/Hepatic Neg liver ROS, hiatal hernia,GERD  Medicated and Controlled,,  Endo/Other  diabetes, Insulin Dependent    Renal/GU negative Renal ROS     Musculoskeletal  (+) Arthritis ,    Abdominal   Peds  Hematology  On eliquis Clotting d/o with multiple DVT/PE    Anesthesia Other Findings   Reproductive/Obstetrics                             Anesthesia Physical Anesthesia Plan  ASA: 3  Anesthesia Plan: Regional   Post-op Pain Management: Regional block* and Tylenol PO (pre-op)*   Induction:   PONV Risk Score and Plan: 3 and Propofol infusion and Treatment may vary due to age or medical condition  Airway Management Planned: Natural Airway and Simple Face Mask  Additional Equipment: None  Intra-op Plan:   Post-operative Plan:   Informed Consent: I have reviewed the patients History and Physical, chart, labs and discussed the procedure including the risks, benefits and alternatives for the proposed anesthesia with the patient or authorized representative who has indicated his/her understanding and acceptance.       Plan Discussed with: CRNA, Anesthesiologist and Surgeon  Anesthesia Plan Comments:        Anesthesia Quick Evaluation

## 2023-04-06 ENCOUNTER — Encounter (HOSPITAL_BASED_OUTPATIENT_CLINIC_OR_DEPARTMENT_OTHER)
Admission: RE | Disposition: A | Discharge status: Home / Self Care | Payer: Self-pay | Source: Home / Self Care | Attending: Orthopedic Surgery

## 2023-04-06 ENCOUNTER — Ambulatory Visit (HOSPITAL_BASED_OUTPATIENT_CLINIC_OR_DEPARTMENT_OTHER)
Admission: RE | Admit: 2023-04-06 | Discharge: 2023-04-06 | Disposition: A | Discharge status: Home / Self Care | Payer: Medicare Other | Attending: Orthopedic Surgery | Admitting: Orthopedic Surgery

## 2023-04-06 ENCOUNTER — Ambulatory Visit (HOSPITAL_BASED_OUTPATIENT_CLINIC_OR_DEPARTMENT_OTHER): Payer: Medicare Other | Admitting: Anesthesiology

## 2023-04-06 ENCOUNTER — Encounter (HOSPITAL_BASED_OUTPATIENT_CLINIC_OR_DEPARTMENT_OTHER): Payer: Self-pay | Admitting: Orthopedic Surgery

## 2023-04-06 ENCOUNTER — Other Ambulatory Visit: Payer: Self-pay

## 2023-04-06 DIAGNOSIS — Z79899 Other long term (current) drug therapy: Secondary | ICD-10-CM | POA: Insufficient documentation

## 2023-04-06 DIAGNOSIS — Z86718 Personal history of other venous thrombosis and embolism: Secondary | ICD-10-CM | POA: Diagnosis not present

## 2023-04-06 DIAGNOSIS — S63215A Subluxation of metacarpophalangeal joint of left ring finger, initial encounter: Secondary | ICD-10-CM | POA: Insufficient documentation

## 2023-04-06 DIAGNOSIS — Z01818 Encounter for other preprocedural examination: Secondary | ICD-10-CM

## 2023-04-06 DIAGNOSIS — E119 Type 2 diabetes mellitus without complications: Secondary | ICD-10-CM | POA: Insufficient documentation

## 2023-04-06 DIAGNOSIS — M1990 Unspecified osteoarthritis, unspecified site: Secondary | ICD-10-CM | POA: Insufficient documentation

## 2023-04-06 DIAGNOSIS — R519 Headache, unspecified: Secondary | ICD-10-CM | POA: Insufficient documentation

## 2023-04-06 DIAGNOSIS — K219 Gastro-esophageal reflux disease without esophagitis: Secondary | ICD-10-CM | POA: Diagnosis not present

## 2023-04-06 DIAGNOSIS — M66241 Spontaneous rupture of extensor tendons, right hand: Secondary | ICD-10-CM

## 2023-04-06 DIAGNOSIS — Z7901 Long term (current) use of anticoagulants: Secondary | ICD-10-CM | POA: Diagnosis not present

## 2023-04-06 DIAGNOSIS — S63213A Subluxation of metacarpophalangeal joint of left middle finger, initial encounter: Secondary | ICD-10-CM | POA: Diagnosis not present

## 2023-04-06 DIAGNOSIS — Z7952 Long term (current) use of systemic steroids: Secondary | ICD-10-CM | POA: Diagnosis not present

## 2023-04-06 DIAGNOSIS — K449 Diaphragmatic hernia without obstruction or gangrene: Secondary | ICD-10-CM | POA: Insufficient documentation

## 2023-04-06 DIAGNOSIS — Z87891 Personal history of nicotine dependence: Secondary | ICD-10-CM | POA: Insufficient documentation

## 2023-04-06 DIAGNOSIS — Z794 Long term (current) use of insulin: Secondary | ICD-10-CM | POA: Insufficient documentation

## 2023-04-06 DIAGNOSIS — I10 Essential (primary) hypertension: Secondary | ICD-10-CM | POA: Diagnosis not present

## 2023-04-06 DIAGNOSIS — X58XXXA Exposure to other specified factors, initial encounter: Secondary | ICD-10-CM | POA: Diagnosis not present

## 2023-04-06 HISTORY — PX: TENDON REPAIR: SHX5111

## 2023-04-06 LAB — GLUCOSE, CAPILLARY
Glucose-Capillary: 120 mg/dL — ABNORMAL HIGH (ref 70–99)
Glucose-Capillary: 136 mg/dL — ABNORMAL HIGH (ref 70–99)

## 2023-04-06 SURGERY — TENDON REPAIR
Anesthesia: Regional | Laterality: Left

## 2023-04-06 MED ORDER — BUPIVACAINE HCL (PF) 0.25 % IJ SOLN
INTRAMUSCULAR | Status: AC
  Filled 2023-04-06: qty 30

## 2023-04-06 MED ORDER — ONDANSETRON HCL 4 MG/2ML IJ SOLN
INTRAMUSCULAR | Status: DC | PRN
  Administered 2023-04-06: 4 mg via INTRAVENOUS

## 2023-04-06 MED ORDER — 0.9 % SODIUM CHLORIDE (POUR BTL) OPTIME
TOPICAL | Status: DC | PRN
  Administered 2023-04-06: 100 mL

## 2023-04-06 MED ORDER — LIDOCAINE 2% (20 MG/ML) 5 ML SYRINGE
INTRAMUSCULAR | Status: AC
  Filled 2023-04-06: qty 5

## 2023-04-06 MED ORDER — ACETAMINOPHEN 500 MG PO TABS
1000.0000 mg | ORAL_TABLET | Freq: Once | ORAL | Status: AC
  Administered 2023-04-06: 1000 mg via ORAL

## 2023-04-06 MED ORDER — MIDAZOLAM HCL 2 MG/2ML IJ SOLN
INTRAMUSCULAR | Status: AC
  Filled 2023-04-06: qty 2

## 2023-04-06 MED ORDER — ONDANSETRON HCL 4 MG/2ML IJ SOLN: 4.0000 mg | Freq: Once | INTRAMUSCULAR | Status: DC | PRN

## 2023-04-06 MED ORDER — LACTATED RINGERS IV SOLN: INTRAVENOUS | Status: DC

## 2023-04-06 MED ORDER — ONDANSETRON HCL 4 MG/2ML IJ SOLN
INTRAMUSCULAR | Status: AC
  Filled 2023-04-06: qty 6

## 2023-04-06 MED ORDER — CEFAZOLIN SODIUM-DEXTROSE 2-4 GM/100ML-% IV SOLN
INTRAVENOUS | Status: AC
  Filled 2023-04-06: qty 100

## 2023-04-06 MED ORDER — SODIUM CHLORIDE 0.9 % IV SOLN: INTRAVENOUS | Status: DC | PRN

## 2023-04-06 MED ORDER — PROPOFOL 10 MG/ML IV BOLUS
INTRAVENOUS | Status: AC
  Filled 2023-04-06: qty 20

## 2023-04-06 MED ORDER — ACETAMINOPHEN 500 MG PO TABS
ORAL_TABLET | ORAL | Status: AC
  Filled 2023-04-06: qty 2

## 2023-04-06 MED ORDER — FENTANYL CITRATE (PF) 100 MCG/2ML IJ SOLN: 25.0000 ug | INTRAMUSCULAR | Status: DC | PRN

## 2023-04-06 MED ORDER — BUPIVACAINE-EPINEPHRINE (PF) 0.5% -1:200000 IJ SOLN
INTRAMUSCULAR | Status: DC | PRN
  Administered 2023-04-06: 30 mL via PERINEURAL

## 2023-04-06 MED ORDER — LIDOCAINE 2% (20 MG/ML) 5 ML SYRINGE
INTRAMUSCULAR | Status: AC
  Filled 2023-04-06: qty 15

## 2023-04-06 MED ORDER — EPHEDRINE SULFATE-NACL 50-0.9 MG/10ML-% IV SOSY
PREFILLED_SYRINGE | INTRAVENOUS | Status: DC | PRN
  Administered 2023-04-06: 10 mg via INTRAVENOUS

## 2023-04-06 MED ORDER — LIDOCAINE HCL (PF) 1 % IJ SOLN
INTRAMUSCULAR | Status: AC
  Filled 2023-04-06: qty 30

## 2023-04-06 MED ORDER — PROPOFOL 10 MG/ML IV BOLUS
INTRAVENOUS | Status: DC | PRN
  Administered 2023-04-06: 30 mg via INTRAVENOUS

## 2023-04-06 MED ORDER — PROPOFOL 500 MG/50ML IV EMUL
INTRAVENOUS | Status: DC | PRN
  Administered 2023-04-06: 75 ug/kg/min via INTRAVENOUS

## 2023-04-06 MED ORDER — ONDANSETRON HCL 4 MG/2ML IJ SOLN
INTRAMUSCULAR | Status: AC
  Filled 2023-04-06: qty 2

## 2023-04-06 MED ORDER — CEFAZOLIN SODIUM-DEXTROSE 2-4 GM/100ML-% IV SOLN
2.0000 g | INTRAVENOUS | Status: AC
  Administered 2023-04-06: 2 g via INTRAVENOUS

## 2023-04-06 MED ORDER — FENTANYL CITRATE (PF) 100 MCG/2ML IJ SOLN
50.0000 ug | Freq: Once | INTRAMUSCULAR | Status: AC
  Administered 2023-04-06: 50 ug via INTRAVENOUS

## 2023-04-06 MED ORDER — LIDOCAINE 2% (20 MG/ML) 5 ML SYRINGE
INTRAMUSCULAR | Status: DC | PRN
  Administered 2023-04-06: 20 mg via INTRAVENOUS

## 2023-04-06 MED ORDER — FENTANYL CITRATE (PF) 100 MCG/2ML IJ SOLN
INTRAMUSCULAR | Status: AC
  Filled 2023-04-06: qty 2

## 2023-04-06 SURGICAL SUPPLY — 65 items
APL PRP STRL LF DISP 70% ISPRP (MISCELLANEOUS) ×1
BLADE MINI RND TIP GREEN BEAV (BLADE) IMPLANT
BLADE SURG 15 STRL LF DISP TIS (BLADE) ×2 IMPLANT
BLADE SURG 15 STRL SS (BLADE) ×2
BNDG CMPR 5X2 KNTD ELC UNQ LF (GAUZE/BANDAGES/DRESSINGS)
BNDG CMPR 5X3 KNIT ELC UNQ LF (GAUZE/BANDAGES/DRESSINGS) ×1
BNDG CMPR 9X4 STRL LF SNTH (GAUZE/BANDAGES/DRESSINGS) ×1
BNDG ELASTIC 2INX 5YD STR LF (GAUZE/BANDAGES/DRESSINGS) IMPLANT
BNDG ELASTIC 3INX 5YD STR LF (GAUZE/BANDAGES/DRESSINGS) ×1 IMPLANT
BNDG ESMARK 4X9 LF (GAUZE/BANDAGES/DRESSINGS) ×1 IMPLANT
BNDG GAUZE DERMACEA FLUFF 4 (GAUZE/BANDAGES/DRESSINGS) ×1 IMPLANT
BNDG GZE DERMACEA 4 6PLY (GAUZE/BANDAGES/DRESSINGS) ×1
CATH ROBINSON RED A/P 10FR (CATHETERS) IMPLANT
CHLORAPREP W/TINT 26 (MISCELLANEOUS) ×1 IMPLANT
CORD BIPOLAR FORCEPS 12FT (ELECTRODE) ×1 IMPLANT
COVER BACK TABLE 60X90IN (DRAPES) ×1 IMPLANT
COVER MAYO STAND STRL (DRAPES) ×1 IMPLANT
CUFF TOURN SGL QUICK 18X4 (TOURNIQUET CUFF) ×2 IMPLANT
DRAPE EXTREMITY T 121X128X90 (DISPOSABLE) ×1 IMPLANT
DRAPE OEC MINIVIEW 54X84 (DRAPES) IMPLANT
DRAPE SURG 17X23 STRL (DRAPES) ×1 IMPLANT
GAUZE 4X4 16PLY ~~LOC~~+RFID DBL (SPONGE) IMPLANT
GAUZE PAD ABD 8X10 STRL (GAUZE/BANDAGES/DRESSINGS) IMPLANT
GAUZE XEROFORM 1X8 LF (GAUZE/BANDAGES/DRESSINGS) ×1 IMPLANT
GLOVE BIO SURGEON STRL SZ7 (GLOVE) ×2 IMPLANT
GLOVE BIOGEL PI IND STRL 7.0 (GLOVE) ×1 IMPLANT
GLOVE BIOGEL PI IND STRL 7.5 (GLOVE) IMPLANT
GOWN STRL REUS W/ TWL LRG LVL3 (GOWN DISPOSABLE) ×2 IMPLANT
GOWN STRL REUS W/TWL LRG LVL3 (GOWN DISPOSABLE) ×2
LOOP VASCLR MAXI BLUE 18IN ST (MISCELLANEOUS) IMPLANT
NDL HYPO 25X1 1.5 SAFETY (NEEDLE) IMPLANT
NDL KEITH (NEEDLE) IMPLANT
NEEDLE HYPO 25X1 1.5 SAFETY (NEEDLE)
NEEDLE KEITH (NEEDLE)
NS IRRIG 1000ML POUR BTL (IV SOLUTION) ×1 IMPLANT
PACK BASIN DAY SURGERY FS (CUSTOM PROCEDURE TRAY) ×1 IMPLANT
PAD CAST 3X4 CTTN HI CHSV (CAST SUPPLIES) ×2 IMPLANT
PAD CAST 4YDX4 CTTN HI CHSV (CAST SUPPLIES) IMPLANT
PADDING CAST ABS COTTON 3X4 (CAST SUPPLIES) IMPLANT
PADDING CAST ABS COTTON 4X4 ST (CAST SUPPLIES) ×1 IMPLANT
PADDING CAST COTTON 3X4 STRL (CAST SUPPLIES) ×1
PADDING CAST COTTON 4X4 STRL (CAST SUPPLIES)
SLEEVE SCD COMPRESS KNEE MED (STOCKING) IMPLANT
SLING ARM FOAM STRAP MED (SOFTGOODS) IMPLANT
SPIKE FLUID TRANSFER (MISCELLANEOUS) IMPLANT
SPLINT FIBERGLASS 4X30 (CAST SUPPLIES) ×1 IMPLANT
SPLINT PLASTER CAST XFAST 3X15 (CAST SUPPLIES) IMPLANT
SUT ETHIBOND 3-0 V-5 (SUTURE) IMPLANT
SUT ETHILON 3 0 PS 1 (SUTURE) IMPLANT
SUT ETHILON 4 0 PS 2 18 (SUTURE) ×1 IMPLANT
SUT FIBERWIRE 3-0 18 TAPR NDL (SUTURE)
SUT FIBERWIRE 4-0 18 DIAM BLUE (SUTURE)
SUT MERSILENE 4 0 P 3 (SUTURE) IMPLANT
SUT MNCRL AB 4-0 PS2 18 (SUTURE) IMPLANT
SUT MON AB 5-0 PS2 18 (SUTURE) IMPLANT
SUT PROLENE 6 0 P 1 18 (SUTURE) IMPLANT
SUT SILK 2 0 PERMA HAND 18 BK (SUTURE) IMPLANT
SUT SUPRAMID 4-0 (SUTURE) IMPLANT
SUTURE FIBERWR 3-0 18 TAPR NDL (SUTURE) IMPLANT
SUTURE FIBERWR 4-0 18 DIA BLUE (SUTURE) IMPLANT
SYR BULB EAR ULCER 3OZ GRN STR (SYRINGE) ×1 IMPLANT
SYR CONTROL 10ML LL (SYRINGE) IMPLANT
TOWEL GREEN STERILE FF (TOWEL DISPOSABLE) ×2 IMPLANT
UNDERPAD 30X36 HEAVY ABSORB (UNDERPADS AND DIAPERS) ×1 IMPLANT
VASCULAR TIE MAXI BLUE 18IN ST (MISCELLANEOUS)

## 2023-04-06 NOTE — H&P (Signed)
HAND SURGERY   HPI: Patient is a 72 y.o. 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  She has failed conservative management.  She presents today for sagittal band reconstruction..  Patient denies any changes to their medical history or new systemic symptoms today.    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HCC)    Clotting disorder (HCC)    Complication of anesthesia    difficult waking   Diabetes mellitus without complication (HCC)    Diet and exercise controlled   Diverticulosis    DVT (deep venous thrombosis) (HCC) 02/2019   3calf 2 right lung 1 left lung   DVT (deep venous thrombosis) (HCC) 2020   right leg   Dyspnea    GERD (gastroesophageal reflux disease)    Headache    Hepatic steatosis    Hiatal hernia    History of kidney stones    Hyperlipidemia    MVP (mitral valve prolapse)    history of   PONV (postoperative nausea and vomiting)    Pulmonary emboli (HCC) 03/01/2019   2 in the right; 1 in the left   Past Surgical History:  Procedure Laterality Date   arm surgery  2012   left forearm fracture w/ plates pins screws   ARTERY BIOPSY Right 12/03/2021   Procedure: BIOPSY TEMPORAL ARTERY;  Surgeon: Annice Needy, MD;  Location: [facility] OR[facility];  Service: Vascular;  Laterality: Right;  Right temple   BREAST BIOPSY Left    BREAST SURGERY  2000   breast biopsy (benign)   CATARACT EXTRACTION Right 2005   CATARACT EXTRACTION Left 2010   LITHOTRIPSY  08/12/2008   stent placed bilaterally   PANCREAS BIOPSY  2021   PANCREAS BIOPSY  2019   PARTIAL HYSTERECTOMY     Both ovaries remain, vaginal, for mennorhagia   REPAIR EXTENSOR TENDON Right 06/30/2022   Procedure: Middle and ring finger sagittal band reconstruction;   Surgeon: Marlyne Beards, MD;  Location: [facility];  Service: Orthopedics;  Laterality: Right;   TONSILLECTOMY     TUBAL LIGATION  1980   Social History   Socioeconomic History   Marital status: Married    Spouse name: Channing Mutters   Number of children: 0   Years of education: Not on file   Highest education level: Bachelor's degree (e.g., BA, AB, BS)  Occupational History   Occupation: retired Freight forwarder: retired  Tobacco Use   Smoking status: Former    Current packs/day: 0.00    Average packs/day: 1 pack/day for 13.0 years (13.0 ttl pk-yrs)    Types: Cigarettes    Start date: 06/07/1978    Quit date: 06/08/1991    Years since quitting: 31.8    Passive exposure: Never   Smokeless tobacco: Never  Vaping Use   Vaping status: Never Used  Substance and Sexual Activity   Alcohol use: No   Drug use: No   Sexual activity: Not on file  Other Topics Concern   Not on file  Social History Narrative   Regular exercise--yes, recumbent bike 3-4 days a week      Diet: fruits and veggies, water, eats at home, drinks a lot of milk      Patient is right-handed. She drinks  1-2 cups of coffee a day.      One story home   Social Determinants of Health   Financial Resource Strain: Low Risk  (09/13/2022)   Overall Financial Resource Strain (CARDIA)    Difficulty of Paying Living Expenses: Not hard at all  Food Insecurity: No Food Insecurity (09/13/2022)   Hunger Vital Sign    Worried About Running Out of Food in the Last Year: Never true    Ran Out of Food in the Last Year: Never true  Transportation Needs: No Transportation Needs (09/13/2022)   PRAPARE - Administrator, Civil Service (Medical): No    Lack of Transportation (Non-Medical): No  Physical Activity: Insufficiently Active (09/13/2022)   Exercise Vital Sign    Days of Exercise per Week: 4 days    Minutes of Exercise per Session: 30 min  Stress: No Stress Concern Present (09/13/2022)   [employer] of  Occupational Health - Occupational Stress Questionnaire    Feeling of Stress : Not at all  Social Connections: Moderately Isolated (09/13/2022)   Social Connection and Isolation Panel [NHANES]    Frequency of Communication with Friends and Family: Twice a week    Frequency of Social Gatherings with Friends and Family: Three times a week    Attends Religious Services: Never    Active Member of Clubs or Organizations: No    Attends Engineer, structural: Never    Marital Status: Married   Family History  Problem Relation Age of Onset   Bone cancer Mother    Hypertension Father    Mitral valve prolapse Father    Asthma Brother    Arthritis Brother    Nephrolithiasis Brother    Asthma Brother    COPD Brother    Breast cancer Maternal Aunt    Breast cancer Maternal Aunt    Colon cancer Neg Hx    Esophageal cancer Neg Hx    Inflammatory bowel disease Neg Hx    Liver disease Neg Hx    Pancreatic cancer Neg Hx    Rectal cancer Neg Hx    Stomach cancer Neg Hx    - negative except otherwise stated in the family history section Allergies  Allergen Reactions   Clindamycin/Lincomycin Anaphylaxis   Sudafed [Pseudoephedrine Hcl] Hives, Itching and Anxiety   Atenolol Other (See Comments)    Migraine   Codeine Itching and Swelling    REACTION: Migraine   Epinephrine Other (See Comments)    *UNSURE OF REACTION*   Hydrocodone Other (See Comments)    Migraine   Ibuprofen Other (See Comments)    Migraine   Jardiance [Empagliflozin] Other (See Comments)    Caused uti Yeast infection   Metronidazole Swelling   Oxycodone Other (See Comments)    Migraine   Penicillins Itching and Swelling    ** Tolerates cephalosporins Has patient had a PCN reaction causing immediate rash, facial/tongue/throat swelling, SOB or lightheadedness with hypotension: No Has patient had a PCN reaction causing severe rash involving mucus membranes or skin necrosis: No Has patient had a PCN reaction that  required hospitalization: No Has patient had a PCN reaction occurring within the last 10 years: No     Promethazine Other (See Comments)    migraine   Propranolol Other (See Comments)    Migraine   Talwin [Pentazocine] Other (See Comments)    Swelling and itching    Tramadol Other (See Comments)    Migraine   Vancomycin Swelling    Swelling  of Lips and Mouth   Crestor [Rosuvastatin] Other (See Comments)    Muscle pain   Prior to Admission medications   Medication Sig Start Date End Date Taking? Authorizing Provider  atorvastatin (LIPITOR) 80 MG tablet TAKE 1 TABLET BY MOUTH DAILY 09/14/22  Yes Bedsole, Amy E, MD  budesonide (ENTOCORT EC) 3 MG 24 hr capsule TAKE 2 CAPSULES BY MOUTH DAILY. 03/14/23  Yes Mansouraty, Netty Starring., MD  colesevelam (WELCHOL) 625 MG tablet TAKE 3 TABLETS BY MOUTH 2 TIMES DAILY WITH A MEAL. 09/14/22  Yes Bedsole, Amy E, MD  ELIQUIS 2.5 MG TABS tablet TAKE 1 TABLET BY MOUTH 2 TIMES DAILY. 01/26/23  Yes Bedsole, Amy E, MD  ferrous gluconate (FERGON) 324 MG tablet Take 1 tablet (324 mg total) by mouth daily with breakfast. 01/14/23  Yes Bedsole, Amy E, MD  gabapentin (NEURONTIN) 100 MG capsule TAKE 1 CAPSULE BY MOUTH UP TO 3 TIMES DAILY AS NEEDED. 12/03/22  Yes Jaffe, Adam R, DO  gabapentin (NEURONTIN) 300 MG capsule Take 2 capsules (600 mg total) by mouth at bedtime. 12/03/22  Yes Jaffe, Adam R, DO  insulin glargine (LANTUS SOLOSTAR) 100 UNIT/ML Solostar Pen Inject 14 Units into the skin daily. 11/12/22  Yes Shamleffer, Konrad Dolores, MD  omeprazole (PRILOSEC) 20 MG capsule TAKE 1 CAPSULE BY MOUTH 2 TIMES DAILY BEFORE A MEAL. BEFORE BREAKFAST AND BEDTIME Patient taking differently: daily. 03/14/23  Yes Mansouraty, Netty Starring., MD  potassium chloride SA (KLOR-CON M) 20 MEQ tablet TAKE 1 TABLET BY MOUTH DAILY. 09/14/22  Yes Bedsole, Amy E, MD  repaglinide (PRANDIN) 0.5 MG tablet Take 2 tablets (1 mg) by mouth daily before lunch AND 1 tablet (0.5 mg) daily before supper   Yes  [provider]  Ubrogepant (UBRELVY) 100 MG TABS Take 1 tablet (100 mg total) by mouth as needed (May repeat dose after 2 hours if needed.  Maximum 2 tablets in 24 hours). 12/03/22  Yes Everlena Cooper, Adam R, DO  vitamin B-12 (CYANOCOBALAMIN) 1000 MCG tablet Take 1,000 mcg by mouth daily with breakfast. 08/15/20  Yes [provider]  Vitamin D, Ergocalciferol, (DRISDOL) 1.25 MG (50000 UNIT) CAPS capsule TAKE 1 CAPSULE BY MOUTH ONCE A WEEK. 07/20/22  Yes Bedsole, Amy E, MD  aluminum-magnesium hydroxide 200-200 MG/5ML suspension Take by mouth every 6 (six) hours as needed for indigestion. As needed    [provider]  Blood Glucose Monitoring Suppl (ONETOUCH VERIO REFLECT) w/Device KIT TEST TWICE A DAY 07/06/22   Bedsole, Amy E, MD  Continuous Blood Gluc Sensor (DEXCOM G7 SENSOR) MISC 1 Device by Does not apply route as directed. 06/08/22   Shamleffer, Konrad Dolores, MD  Eptinezumab-jjmr (VYEPTI) 100 MG/ML injection Inject 300 mg into the vein every 3 (three) months.    [provider]  Insulin Pen Needle 32G X 4 MM MISC by Does not apply route.    [provider]  Lancets (ONETOUCH DELICA PLUS LANCET30G) MISC USE TO CHECK BLOOD SUGAR TWO TIMES DAILY 05/08/21   Ermalene Searing, Amy E, MD  meclizine (ANTIVERT) 25 MG tablet Take 1 tablet (25 mg total) by mouth 3 (three) times daily as needed for dizziness. 12/03/22   Everlena Cooper, Adam R, DO  ondansetron (ZOFRAN) 4 MG tablet Take 1 tablet (4 mg total) by mouth every 8 (eight) hours as needed for nausea or vomiting. 02/12/22   Mansouraty, Netty Starring., MD  ONETOUCH VERIO test strip USE TO CHECK BLOOD SUGAR TWO TIMES A DAY 07/06/22   Bedsole, Amy E,  MD   No results found. - Positive ROS: All other systems have been reviewed and were otherwise negative with the exception of those mentioned in the HPI and as above.  Physical Exam: General: No acute distress, resting comfortably Cardiovascular: BUE warm and well perfused, normal  rate Respiratory: Normal WOB on RA Skin: Warm and dry Neurologic: Sensation intact distally Psychiatric: Patient is at baseline mood and affect  Left upper Extremity  Skin is warm and dry with no discoloration or lesions.  She still has obvious ulnar subluxation of the extensor apparatus into the intermetacarpal valley with finger flexion.  This results in locking/catching of the fingers that is quite uncomfortable.  Has full and painless range of motion of the thumb, index, and small fingers.  Sensations intact light touch in the median, ulnar, radial nerve distributions.  Her hand is warm and well-perfused with brisk capillary refill.   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  Plan: OR today for left middle and ring finger sagittal band reconstructions. We again reviewed the risks of surgery which include bleeding, infection, damage to neurovascular structures, persistent symptoms, cyst and tendon instability, finger stiffness, delayed wound healing, need for additional surgery.  Informed consent was signed.  All questions were answered.   Marlyne Beards, M.D. [facility] 7:19 AM

## 2023-04-06 NOTE — Discharge Instructions (Addendum)
Waylan Rocher, M.D. Hand Surgery  POST-OPERATIVE DISCHARGE INSTRUCTIONS   PRESCRIPTIONS: You may have been given a prescription to be taken as directed for post-operative pain control.  You may also take over the counter ibuprofen/aleve and tylenol for pain. Take this as directed on the packaging. Do not exceed 3000 mg tylenol/acetaminophen in 24 hours.  Ibuprofen 600-800 mg (3-4) tablets by mouth every 6 hours as needed for pain.  OR Aleve 2 tablets by mouth every 12 hours (twice daily) as needed for pain.  AND/OR Tylenol 1000 mg (2 tablets) every 8 hours as needed for pain.  Please use your pain medication carefully, as refills are limited and you may not be provided with one.  As stated above, please use over the counter pain medicine - it will also be helpful with decreasing your swelling.    ANESTHESIA: After your surgery, post-surgical discomfort or pain is likely. This discomfort can last several days to a few weeks. At certain times of the day your discomfort may be more intense.   Did you receive a nerve block?  A nerve block can provide pain relief for one hour to two days after your surgery. As long as the nerve block is working, you will experience little or no sensation in the area the surgeon operated on.  As the nerve block wears off, you will begin to experience pain or discomfort. It is very important that you begin taking your prescribed pain medication before the nerve block fully wears off. Treating your pain at the first sign of the block wearing off will ensure your pain is better controlled and more tolerable when full-sensation returns. Do not wait until the pain is intolerable, as the medicine will be less effective. It is better to treat pain in advance than to try and catch up.   General Anesthesia:  If you did not receive a nerve block during your surgery, you will need to start taking your pain medication shortly after your surgery and should continue  to do so as prescribed by your surgeon.     ICE AND ELEVATION: You may use ice for the first 48-72 hours, but it is not critical.   Motion of your fingers is very important to decrease the swelling.  Elevation, as much as possible for the next 48 hours, is critical for decreasing swelling as well as for pain relief. Elevation means when you are seated or lying down, you hand should be at or above your heart. When walking, the hand needs to be at or above the level of your elbow.  If the bandage gets too tight, it may need to be loosened. Please contact our office and we will instruct you in how to do this.    SURGICAL BANDAGES:  Keep your dressing and/or splint clean and dry at all times.  Do not remove until you are seen again in the office.  If careful, you may place a plastic bag over your bandage and tape the end to shower, but be careful, do not get your bandages wet.     HAND THERAPY:  You will be contacted to set up the date and time for your first therapy visit.     [facility] Second Floor, [address] [place], [place] [address] [phone]    Post Anesthesia Home Care Instructions  Activity: Get plenty of rest for the remainder of the day. A responsible individual must stay with you for 24 hours following the procedure.  For the next 24 hours, DO NOT: -Drive a car -Advertising copywriter -Drink alcoholic beverages -Take any medication unless instructed by your physician -Make any legal decisions or sign important papers.  Meals: Start with liquid foods such as gelatin or soup. Progress to regular foods as tolerated. Avoid greasy, spicy, heavy foods. If nausea and/or vomiting occur, drink only clear liquids until the nausea and/or vomiting subsides. Call your physician if vomiting continues.  Special Instructions/Symptoms: Your throat may feel dry or sore from the anesthesia or the breathing tube placed in your throat during surgery. If this causes  discomfort, gargle with warm salt water. The discomfort should disappear within 24 hours.  If you had a scopolamine patch placed behind your ear for the management of post- operative nausea and/or vomiting:  1. The medication in the patch is effective for 72 hours, after which it should be removed.  Wrap patch in a tissue and discard in the trash. Wash hands thoroughly with soap and water. 2. You may remove the patch earlier than 72 hours if you experience unpleasant side effects which may include dry mouth, dizziness or visual disturbances. 3. Avoid touching the patch. Wash your hands with soap and water after contact with the patch.    No Tylenol until 1:00

## 2023-04-06 NOTE — Transfer of Care (Signed)
Immediate Anesthesia Transfer of Care Note  Patient: Debra Leach  Procedure(s) Performed: MIDDLE AND RING FINGER SAGITTAL BAND RECONSTRUCTION (Left)  Patient Location: PACU  Anesthesia Type:MAC  Level of Consciousness: awake and patient cooperative  Airway & Oxygen Therapy: Patient Spontanous Breathing and Patient connected to face mask oxygen  Post-op Assessment: Report given to RN and Post -op Vital signs reviewed and stable  Post vital signs: Reviewed and stable  Last Vitals:  Vitals Value Taken Time  BP 134/66 04/06/23 0915  Temp 36.6 C 04/06/23 0915  Pulse 72 04/06/23 0920  Resp 14 04/06/23 0920  SpO2 100 % 04/06/23 0920  Vitals shown include unfiled device data.  Last Pain:  Vitals:   04/06/23 0915  TempSrc:   PainSc: 0-No pain         Complications: No notable events documented.

## 2023-04-06 NOTE — Interval H&P Note (Signed)
History and Physical Interval Note:  04/06/2023 7:22 AM  Debra Leach  has presented today for surgery, with the diagnosis of Left middle and ring finger sagittal band rupture.  The various methods of treatment have been discussed with the patient and family. After consideration of risks, benefits and other options for treatment, the patient has consented to  Procedure(s) with comments: MIDDLE AND RING FINGER SAGITTAL BAND RECONSTRUCTION (Left) - regional120 as a surgical intervention.  The patient's history has been reviewed, patient examined, no change in status, stable for surgery.  I have reviewed the patient's chart and labs.  Questions were answered to the patient's satisfaction.     Jasiel Apachito

## 2023-04-06 NOTE — Op Note (Signed)
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  This has become quite bothersome for and is interfering with her daily activities.  She has difficulty with gripping or grasping activities as her fingers get stuck in a locked position.  She presents today for left middle and ring finger radial sagittal band reconstruction..  Risks, benefits, and alternatives to surgery were again discussed with the patient in the preoperative area. The patient wishes to proceed with surgery.  Informed consent was signed after our discussion.   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  PROCEDURE:  Left middle finger extensor tendon centralization using slip of EDC tendon (16109) Left ring finger extensor tendon centralization using slip of EDC tendon (60454)   SURGEON: Waylan Rocher, M.D.  ASSIST: None  ANESTHESIA:  Regional + MAC  IV FLUIDS AND URINE: See anesthesia.  ESTIMATED BLOOD LOSS: <5 mL.  IMPLANTS: * No implants in log *   DRAINS: None  COMPLICATIONS: None  DESCRIPTION OF PROCEDURE: The patient was met in the preoperative holding area where the surgical site was marked and the consent form was signed.  The patient was then taken to the operating room and remained on the stretcher.  All bony prominences were well padded. A hand table was placed adjacent to the left upper extremity and locked into place. A tourniquet was applied to the left upper arm.  Monitored sedation was induced.  The operative extremity was prepped and draped in the usual and sterile fashion.  A formal time-out was performed to confirm that this was the  correct patient, surgery, side, and site.   Following formal timeout, limb was gently exsanguinated with Esmarch bandage and the tourniquet inflated to 250 mmHg.  A longitudinal incision was were designed at the radial aspect of the middle finger MCP joint and over the ulnar aspect of the ring finger MCP joint to leave his large skin bridge as possible.  The skin was incised.  Small crossing vessels were coagulated as needed.  Blunt dissection was used to identify the extensor apparatus over the MCP joint.  There was obvious attenuation of the radial sagittal band with scarring of the ulnar sagittal band.  This was confirmed both over the middle finger and the ring finger.  The scarred ulnar sagittal band was released to allow for correction of the ulnar subluxation of the tendon.  The patulous radial sagittal band was divided to allow access to the underlying collateral ligament.  I then harvested the extensor digitorum tendon slips.  At the middle finger, I used a radial slip of the tendon.  This was about one third of the width of the tendon.  At the ring finger, I used an ulnar based slip of the tendon again approximately one third the width of the extensor.  I used a small curved hemostat passed deep or volar to the radial collateral ligament.  I then retrieved the end of the tendon graft and passed it underneath the collateral ligament from distal to proximal.  This would serve as our check rein to ulnar subluxation of the tendon.  I then passed the tendon in a Pulvertaft manner through the remaining intact extensor.  This was  sutured using a 3-0 Ethibond in horizontal mattress fashion.  Remaining tendon graft was then sutured to the intact extensor tendon.  I then assessed the position of the tendon with passive flexion at the MCP joint.  The tendon was appropriately centralized.  This was performed both at the middle finger and the ring finger.  At the ring finger, the ulnarly based extensor tendon slip  was passed underneath the intact radial portion of the extensor tendon and passed around the collateral ligament in the same fashion.  I then repaired the radial sagittal band to provide additional resistance to ulnar subluxation.  Passive flexion of both the middle and ring fingers showed that the tendon was appropriately centralized over the midportion of the MCP joint.  This point, the wounds were thoroughly irrigated with copious sterile saline.  The wounds were closed using a 4-0 nylon suture in simple fashion.  The tourniquet was deflated and hemostasis was achieved with direct pressure over the wound.  The wounds were then cleaned and dressed with Xeroform, folded Kerlix, passed padding, and a well-padded volar splint was applied with the MCP joints in full extension and the DIP joints free.   The patient was reversed from sedation. All counts were correct x 2 at the end of the procedure.  The patient was then taken to the PACU in stable condition.   POSTOPERATIVE PLAN: Will be discharged to home with appropriate pain medication and discharge instructions.  I will see her back in 10 to 14 days for her first postop visit.  A referral has been placed to hand therapy.  They will see her in the next 7 to 10 days.  Waylan Rocher, MD 9:22 AM

## 2023-04-06 NOTE — Anesthesia Procedure Notes (Signed)
Anesthesia Regional Block: Axillary brachial plexus block   Pre-Anesthetic Checklist: , timeout performed,  Correct Patient, Correct Site, Correct Laterality,  Correct Procedure, Correct Position, site marked,  Risks and benefits discussed,  Surgical consent,  Pre-op evaluation,  At surgeon's request and post-op pain management  Laterality: Left  Prep: chloraprep       Needles:  Injection technique: Single-shot  Needle Type: Echogenic Needle     Needle Length: 5cm  Needle Gauge: 21     Additional Needles:   Narrative:  Start time: 04/06/2023 7:25 AM End time: 04/06/2023 7:28 AM Injection made incrementally with aspirations every 5 mL.  Performed by: Personally  Anesthesiologist: Beryle Lathe, MD  Additional Notes: No pain on injection. No increased resistance to injection. Injection made in 5cc increments. Good needle visualization. Patient tolerated the procedure well.

## 2023-04-06 NOTE — Progress Notes (Signed)
Assisted Dr. Brock with left, axillary, ultrasound guided block. Side rails up, monitors on throughout procedure. See vital signs in flow sheet. Tolerated Procedure well. 

## 2023-04-06 NOTE — Anesthesia Postprocedure Evaluation (Signed)
Anesthesia Post Note  Patient: Debra Leach  Procedure(s) Performed: MIDDLE AND RING FINGER SAGITTAL BAND RECONSTRUCTION (Left)     Patient location during evaluation: PACU Anesthesia Type: Regional Level of consciousness: awake and alert Pain management: pain level controlled Vital Signs Assessment: post-procedure vital signs reviewed and stable Respiratory status: spontaneous breathing, nonlabored ventilation and respiratory function stable Cardiovascular status: stable and blood pressure returned to baseline Anesthetic complications: no   No notable events documented.  Last Vitals:  Vitals:   04/06/23 0930 04/06/23 0945  BP: (!) 144/80 (!) 152/78  Pulse: 72 69  Resp: 12 12  Temp:    SpO2: 97% 99%    Last Pain:  Vitals:   04/06/23 0930  TempSrc:   PainSc: 0-No pain                 Beryle Lathe

## 2023-04-06 NOTE — Anesthesia Procedure Notes (Signed)
Procedure Name: MAC Date/Time: 04/06/2023 7:45 AM  Performed by: Demetrio Lapping, CRNAPre-anesthesia Checklist: Patient identified, Emergency Drugs available, Suction available, Patient being monitored and Timeout performed Patient Re-evaluated:Patient Re-evaluated prior to induction Oxygen Delivery Method: Simple face mask Placement Confirmation: positive ETCO2 Dental Injury: Teeth and Oropharynx as per pre-operative assessment

## 2023-04-07 ENCOUNTER — Encounter (HOSPITAL_BASED_OUTPATIENT_CLINIC_OR_DEPARTMENT_OTHER): Payer: Self-pay | Admitting: Orthopedic Surgery

## 2023-04-12 ENCOUNTER — Encounter: Payer: Self-pay | Admitting: Family Medicine

## 2023-04-12 ENCOUNTER — Ambulatory Visit (INDEPENDENT_AMBULATORY_CARE_PROVIDER_SITE_OTHER): Payer: Medicare Other | Admitting: Family Medicine

## 2023-04-12 VITALS — BP 124/72 | HR 86 | Temp 97.7°F | Ht 60.0 in | Wt 132.8 lb

## 2023-04-12 DIAGNOSIS — Z7901 Long term (current) use of anticoagulants: Secondary | ICD-10-CM

## 2023-04-12 DIAGNOSIS — Z86711 Personal history of pulmonary embolism: Secondary | ICD-10-CM | POA: Diagnosis not present

## 2023-04-12 NOTE — Assessment & Plan Note (Addendum)
Per routine recommendations, Eliquis can be held 1 day prior to procedure for minor procedures/surgery and restarted that first day postoperatively once hemostasis is achieved. This upcoming procedure is minor so I feel that this is the best schedule to plan.  Nurse office on  Form completed and faxed to Dr. Verlee Rossetti office on April 08, 2023.

## 2023-04-12 NOTE — Patient Instructions (Signed)
Hold both doses of Eliquis 1 day prior to your procedure.  Restart Eliquis as long as there is no major bleeding on the date following procedure.

## 2023-04-12 NOTE — Progress Notes (Signed)
Patient ID: Debra Leach, female    DOB: 10-Nov-1950, 72 y.o.   MRN: [id number]  This visit was conducted in person.  BP 124/72   Pulse 86   Temp 97.7 F (36.5 C) (Temporal)   Ht 5' (1.524 m)   Wt 132 lb 12.8 oz (60.2 kg)   SpO2 97%   BMI 25.94 kg/m    CC:  Chief Complaint  Patient presents with   Surgical Clearance    To hold Eliquis for back injection    Subjective:   HPI: Debra Leach is a 72 y.o. female presenting on 04/12/2023 for Surgical Clearance (To hold Eliquis for back injection)   Has upcoming appt next Thursday for steroid injection in back. Needs form completion for clearance to hold  Eliquis.  Planned by Dr. Danielle Dess.  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 Using tylenol for pain. Has not been using the meloxicam she was given.     Relevant past medical, surgical, family and social history reviewed and updated as indicated. Interim medical history since our last visit reviewed. Allergies and medications reviewed and updated. Outpatient Medications Prior to Visit  Medication Sig Dispense Refill   aluminum-magnesium hydroxide 200-200 MG/5ML suspension Take by mouth every 6 (six) hours as needed for indigestion. As needed     atorvastatin (LIPITOR) 80 MG tablet TAKE 1 TABLET BY MOUTH DAILY 90 tablet 3   Blood Glucose Monitoring Suppl (ONETOUCH VERIO REFLECT) w/Device KIT TEST TWICE A DAY 1 kit 0   budesonide (ENTOCORT EC) 3 MG 24 hr capsule TAKE 2 CAPSULES BY MOUTH DAILY. 180 capsule 12   colesevelam (WELCHOL) 625 MG tablet TAKE 3 TABLETS BY MOUTH 2 TIMES DAILY WITH A MEAL. 540 tablet 3   Continuous Blood Gluc Sensor (DEXCOM G7 SENSOR) MISC 1 Device by Does not apply route as directed. 9 each 3   ELIQUIS 2.5 MG TABS tablet TAKE 1 TABLET BY MOUTH 2 TIMES DAILY. 180  tablet 0   Eptinezumab-jjmr (VYEPTI) 100 MG/ML injection Inject 300 mg into the vein every 3 (three) months.     ferrous gluconate (FERGON) 324 MG tablet Take 1 tablet (324 mg total) by mouth daily with breakfast. 90 tablet 1   gabapentin (NEURONTIN) 100 MG capsule TAKE 1 CAPSULE BY MOUTH UP TO 3 TIMES DAILY AS NEEDED. 270 capsule 3   gabapentin (NEURONTIN) 300 MG capsule Take 2 capsules (600 mg total) by mouth at bedtime. 180 capsule 1   insulin glargine (LANTUS SOLOSTAR) 100 UNIT/ML Solostar Pen Inject 14 Units into the skin daily. 15 mL 3   Insulin Pen Needle 32G X 4 MM MISC by Does not apply route.     Lancets (ONETOUCH DELICA PLUS LANCET30G) MISC USE TO CHECK BLOOD SUGAR TWO TIMES DAILY 200 each 3   meclizine (ANTIVERT) 25 MG tablet Take 1 tablet (25 mg total) by mouth 3 (three) times daily as needed for dizziness. 30 tablet 1   ondansetron (ZOFRAN) 4 MG tablet Take 1 tablet (4 mg total) by mouth every 8 (eight) hours as needed for nausea or vomiting. 20 tablet 6   ONETOUCH VERIO test strip USE TO CHECK BLOOD SUGAR TWO TIMES A DAY 200 strip 3   potassium chloride SA (KLOR-CON M) 20 MEQ tablet TAKE 1 TABLET BY  MOUTH DAILY. 90 tablet 3   repaglinide (PRANDIN) 0.5 MG tablet Take 2 tablets (1 mg) by mouth daily before lunch AND 1 tablet (0.5 mg) daily before supper     Ubrogepant (UBRELVY) 100 MG TABS Take 1 tablet (100 mg total) by mouth as needed (May repeat dose after 2 hours if needed.  Maximum 2 tablets in 24 hours). 10 tablet 3   vitamin B-12 (CYANOCOBALAMIN) 1000 MCG tablet Take 1,000 mcg by mouth daily with breakfast.     Vitamin D, Ergocalciferol, (DRISDOL) 1.25 MG (50000 UNIT) CAPS capsule TAKE 1 CAPSULE BY MOUTH ONCE A WEEK. 12 capsule 3   omeprazole (PRILOSEC) 20 MG capsule TAKE 1 CAPSULE BY MOUTH 2 TIMES DAILY BEFORE A MEAL. BEFORE BREAKFAST AND BEDTIME (Patient taking differently: daily.) 180 capsule 12   No facility-administered medications prior to visit.     Per HPI unless  specifically indicated in ROS section below Review of Systems  Constitutional:  Negative for fatigue and fever.  HENT:  Negative for congestion.   Eyes:  Negative for pain.  Respiratory:  Negative for cough and shortness of breath.   Cardiovascular:  Negative for chest pain, palpitations and leg swelling.  Gastrointestinal:  Negative for abdominal pain.  Genitourinary:  Negative for dysuria and vaginal bleeding.  Musculoskeletal:  Negative for back pain.  Neurological:  Negative for syncope, light-headedness and headaches.  Psychiatric/Behavioral:  Negative for dysphoric mood.    Objective:  BP 124/72   Pulse 86   Temp 97.7 F (36.5 C) (Temporal)   Ht 5' (1.524 m)   Wt 132 lb 12.8 oz (60.2 kg)   SpO2 97%   BMI 25.94 kg/m   Wt Readings from Last 3 Encounters:  04/12/23 132 lb 12.8 oz (60.2 kg)  04/06/23 131 lb 13.4 oz (59.8 kg)  03/09/23 132 lb (59.9 kg)      Physical Exam Vitals reviewed.  Constitutional:      Appearance: She is normal weight.  HENT:     Head: Normocephalic.     Mouth/Throat:     Mouth: Mucous membranes are moist.  Cardiovascular:     Rate and Rhythm: Normal rate and regular rhythm.  Pulmonary:     Effort: Pulmonary effort is normal. No respiratory distress.     Breath sounds: Normal breath sounds.  Abdominal:     General: Abdomen is flat. Bowel sounds are normal.       Results for orders placed or performed during the hospital encounter of 04/06/23  Basic metabolic panel per protocol  Result Value Ref Range   Sodium 142 135 - 145 mmol/L   Potassium 3.6 3.5 - 5.1 mmol/L   Chloride 106 98 - 111 mmol/L   CO2 24 22 - 32 mmol/L   Glucose, Bld 171 (H) 70 - 99 mg/dL   BUN 11 8 - 23 mg/dL   Creatinine, Ser 2.44 0.44 - 1.00 mg/dL   Calcium 9.2 8.9 - 01.0 mg/dL   GFR, Estimated >27 >25 mL/min   Anion gap 12 5 - 15  Glucose, capillary  Result Value Ref Range   Glucose-Capillary 120 (H) 70 - 99 mg/dL  Glucose, capillary  Result Value Ref Range    Glucose-Capillary 136 (H) 70 - 99 mg/dL   *Note: Due to a large number of results and/or encounters for the requested time period, some results have not been displayed. A complete set of results can be found in Results Review.    Assessment and Plan  Chronic anticoagulation  History of pulmonary embolism Assessment & Plan: Per routine recommendations, Eliquis can be held 1 day prior to procedure for minor procedures/surgery and restarted that first day postoperatively once hemostasis is achieved. This upcoming procedure is minor so I feel that this is the best schedule to plan.  Nurse office on  Form completed and faxed to Dr. Verlee Rossetti office on April 08, 2023.     No follow-ups on file.   Kerby Nora, MD

## 2023-04-22 ENCOUNTER — Encounter: Payer: Self-pay | Admitting: Family Medicine

## 2023-04-22 ENCOUNTER — Ambulatory Visit (INDEPENDENT_AMBULATORY_CARE_PROVIDER_SITE_OTHER): Payer: Medicare Other | Admitting: Family Medicine

## 2023-04-22 ENCOUNTER — Encounter: Payer: Self-pay | Admitting: *Deleted

## 2023-04-22 VITALS — BP 144/64 | HR 81 | Temp 97.8°F | Ht 60.0 in | Wt 131.1 lb

## 2023-04-22 DIAGNOSIS — R252 Cramp and spasm: Secondary | ICD-10-CM | POA: Diagnosis not present

## 2023-04-22 DIAGNOSIS — R490 Dysphonia: Secondary | ICD-10-CM | POA: Insufficient documentation

## 2023-04-22 DIAGNOSIS — E7800 Pure hypercholesterolemia, unspecified: Secondary | ICD-10-CM

## 2023-04-22 DIAGNOSIS — E042 Nontoxic multinodular goiter: Secondary | ICD-10-CM

## 2023-04-22 NOTE — Assessment & Plan Note (Signed)
Acute but persistent and ongoing now for 6 weeks. No clear new medication changes, recent surgery had no intubation. She does have a history of esophageal stricture which was recently dilated.  She takes omeprazole 20 mg p.o. daily. She does have a history of multinodular thyroid but last imaging was 2021. She denies any allergy symptoms currently but does have issues with this off and on.  Will start with ultrasound of thyroid for assessment as to whether thyroid nodules have changed in size and could be causing hoarse voice. Will evaluate thyroid function. She can increase omeprazole to 40 mg p.o. daily to cover for the fact that reflux could be causing her symptoms.  She can also try a daily antihistamine or Flonase 2 sprays per nostril daily to cover allergy postnasal drip as source of hoarseness.  There is no red flags or indication for urgent evaluation, no airway compromise.  If ultrasound is unchanged and her symptoms are not improving with the above treatment we will consider referral to ENT for laryngoscopy.

## 2023-04-22 NOTE — Progress Notes (Signed)
Patient ID: Kaiah Boulos, female    DOB: 1950/11/20, 72 y.o.   MRN: [id number]  This visit was conducted in person.  BP (!) 144/64 (BP Location: Right Arm, Patient Position: Sitting, Cuff Size: Normal)   Pulse 81   Temp 97.8 F (36.6 C) (Temporal)   Ht 5' (1.524 m)   Wt 131 lb 2 oz (59.5 kg)   SpO2 98%   BMI 25.61 kg/m    CC:  Chief Complaint  Patient presents with   Hoarse    X 6 weeks    Subjective:   HPI: Alithea Avila is a 72 y.o. female presenting on 04/22/2023 for Hoarse (X 6 weeks)   She has had hoarse voice in last 6  weeks... no proceeding symptoms.  Food like it stops/catches  in throat.. solids   Has history of esophageal stricture... had dilation few months ago.   No post nasal dripo, mild allergies off and on.   She is using omeprazole 20 mg daily... no relfux currently.  History of  Occ using mylanta.    Recent surgery... no intubation.    Has history of multinodular thyroid. Stable  2020 to 2021    Foot cramps night... wonders if potassium low   She also has been working on low chol diet. Relevant past medical, surgical, family and social history reviewed and updated as indicated. Interim medical history since our last visit reviewed. Allergies and medications reviewed and updated. Outpatient Medications Prior to Visit  Medication Sig Dispense Refill   aluminum-magnesium hydroxide 200-200 MG/5ML suspension Take by mouth every 6 (six) hours as needed for indigestion. As needed     atorvastatin (LIPITOR) 80 MG tablet TAKE 1 TABLET BY MOUTH DAILY 90 tablet 3   Blood Glucose Monitoring Suppl (ONETOUCH VERIO REFLECT) w/Device KIT TEST TWICE A DAY 1 kit 0   budesonide (ENTOCORT EC) 3 MG 24 hr capsule TAKE 2 CAPSULES BY MOUTH DAILY. 180 capsule 12   colesevelam (WELCHOL) 625 MG tablet TAKE 3 TABLETS BY MOUTH 2 TIMES DAILY WITH A MEAL. 540 tablet 3   Continuous Blood Gluc Sensor (DEXCOM G7 SENSOR) MISC 1 Device by Does not apply  route as directed. 9 each 3   ELIQUIS 2.5 MG TABS tablet TAKE 1 TABLET BY MOUTH 2 TIMES DAILY. 180 tablet 0   Eptinezumab-jjmr (VYEPTI) 100 MG/ML injection Inject 300 mg into the vein every 3 (three) months.     ferrous gluconate (FERGON) 324 MG tablet Take 1 tablet (324 mg total) by mouth daily with breakfast. 90 tablet 1   gabapentin (NEURONTIN) 100 MG capsule TAKE 1 CAPSULE BY MOUTH UP TO 3 TIMES DAILY AS NEEDED. 270 capsule 3   gabapentin (NEURONTIN) 300 MG capsule Take 2 capsules (600 mg total) by mouth at bedtime. 180 capsule 1   insulin glargine (LANTUS SOLOSTAR) 100 UNIT/ML Solostar Pen Inject 14 Units into the skin daily. 15 mL 3   Insulin Pen Needle 32G X 4 MM MISC by Does not apply route.     Lancets (ONETOUCH DELICA PLUS LANCET30G) MISC USE TO CHECK BLOOD SUGAR TWO TIMES DAILY 200 each 3   meclizine (ANTIVERT) 25 MG tablet Take 1 tablet (25 mg total) by mouth 3 (three) times daily as needed for dizziness. 30 tablet 1   ondansetron (ZOFRAN) 4 MG tablet Take 1 tablet (4 mg total) by mouth every 8 (eight) hours as needed for nausea or vomiting. 20 tablet 6   ONETOUCH VERIO test strip USE  TO CHECK BLOOD SUGAR TWO TIMES A DAY 200 strip 3   potassium chloride SA (KLOR-CON M) 20 MEQ tablet TAKE 1 TABLET BY MOUTH DAILY. 90 tablet 3   repaglinide (PRANDIN) 0.5 MG tablet Take 2 tablets (1 mg) by mouth daily before lunch AND 1 tablet (0.5 mg) daily before supper     Ubrogepant (UBRELVY) 100 MG TABS Take 1 tablet (100 mg total) by mouth as needed (May repeat dose after 2 hours if needed.  Maximum 2 tablets in 24 hours). 10 tablet 3   vitamin B-12 (CYANOCOBALAMIN) 1000 MCG tablet Take 1,000 mcg by mouth daily with breakfast.     Vitamin D, Ergocalciferol, (DRISDOL) 1.25 MG (50000 UNIT) CAPS capsule TAKE 1 CAPSULE BY MOUTH ONCE A WEEK. 12 capsule 3   No facility-administered medications prior to visit.     Per HPI unless specifically indicated in ROS section below Review of Systems   Constitutional:  Negative for fatigue and fever.  HENT:  Negative for congestion.   Eyes:  Negative for pain.  Respiratory:  Negative for cough and shortness of breath.   Cardiovascular:  Negative for chest pain, palpitations and leg swelling.  Gastrointestinal:  Negative for abdominal pain.  Genitourinary:  Negative for dysuria and vaginal bleeding.  Musculoskeletal:  Negative for back pain.  Neurological:  Negative for syncope, light-headedness and headaches.  Psychiatric/Behavioral:  Negative for dysphoric mood.    Objective:  BP (!) 144/64 (BP Location: Right Arm, Patient Position: Sitting, Cuff Size: Normal)   Pulse 81   Temp 97.8 F (36.6 C) (Temporal)   Ht 5' (1.524 m)   Wt 131 lb 2 oz (59.5 kg)   SpO2 98%   BMI 25.61 kg/m   Wt Readings from Last 3 Encounters:  04/22/23 131 lb 2 oz (59.5 kg)  04/12/23 132 lb 12.8 oz (60.2 kg)  04/06/23 131 lb 13.4 oz (59.8 kg)      Physical Exam Constitutional:      General: She is not in acute distress.    Appearance: Normal appearance. She is well-developed. She is not ill-appearing or toxic-appearing.  HENT:     Head: Normocephalic.     Right Ear: Hearing, tympanic membrane, ear canal and external ear normal. Tympanic membrane is not erythematous, retracted or bulging.     Left Ear: Hearing, tympanic membrane, ear canal and external ear normal. Tympanic membrane is not erythematous, retracted or bulging.     Nose: No mucosal edema or rhinorrhea.     Right Sinus: No maxillary sinus tenderness or frontal sinus tenderness.     Left Sinus: No maxillary sinus tenderness or frontal sinus tenderness.     Mouth/Throat:     Mouth: Oropharynx is clear and moist and mucous membranes are normal.     Pharynx: Uvula midline.  Eyes:     General: Lids are normal. Lids are everted, no foreign bodies appreciated.     Extraocular Movements: EOM normal.     Conjunctiva/sclera: Conjunctivae normal.     Pupils: Pupils are equal, round, and reactive  to light.  Neck:     Thyroid: No thyroid mass or thyromegaly.     Vascular: No carotid bruit.     Trachea: Trachea normal.  Cardiovascular:     Rate and Rhythm: Normal rate and regular rhythm.     Pulses: Normal pulses.     Heart sounds: Normal heart sounds, S1 normal and S2 normal. No murmur heard.    No friction rub. No gallop.  Pulmonary:     Effort: Pulmonary effort is normal. No tachypnea or respiratory distress.     Breath sounds: Normal breath sounds. No decreased breath sounds, wheezing, rhonchi or rales.  Abdominal:     General: Bowel sounds are normal.     Palpations: Abdomen is soft.     Tenderness: There is no abdominal tenderness.  Musculoskeletal:     Cervical back: Normal range of motion and neck supple.  Skin:    General: Skin is warm, dry and intact.     Findings: No rash.  Neurological:     Mental Status: She is alert.  Psychiatric:        Mood and Affect: Mood is not anxious or depressed.        Speech: Speech normal.        Behavior: Behavior normal. Behavior is cooperative.        Thought Content: Thought content normal.        Cognition and Memory: Cognition and memory normal.        Judgment: Judgment normal.       Results for orders placed or performed during the hospital encounter of 04/06/23  Basic metabolic panel per protocol  Result Value Ref Range   Sodium 142 135 - 145 mmol/L   Potassium 3.6 3.5 - 5.1 mmol/L   Chloride 106 98 - 111 mmol/L   CO2 24 22 - 32 mmol/L   Glucose, Bld 171 (H) 70 - 99 mg/dL   BUN 11 8 - 23 mg/dL   Creatinine, Ser 5.28 0.44 - 1.00 mg/dL   Calcium 9.2 8.9 - 41.3 mg/dL   GFR, Estimated >24 >40 mL/min   Anion gap 12 5 - 15  Glucose, capillary  Result Value Ref Range   Glucose-Capillary 120 (H) 70 - 99 mg/dL  Glucose, capillary  Result Value Ref Range   Glucose-Capillary 136 (H) 70 - 99 mg/dL   *Note: Due to a large number of results and/or encounters for the requested time period, some results have not been  displayed. A complete set of results can be found in Results Review.    Assessment and Plan  Multiple thyroid nodules -     US THYROID; Future -     TSH  Hoarseness, persistent Assessment & Plan: Acute but persistent and ongoing now for 6 weeks. No clear new medication changes, recent surgery had no intubation. She does have a history of esophageal stricture which was recently dilated.  She takes omeprazole 20 mg p.o. daily. She does have a history of multinodular thyroid but last imaging was 2021. She denies any allergy symptoms currently but does have issues with this off and on.  Will start with ultrasound of thyroid for assessment as to whether thyroid nodules have changed in size and could be causing hoarse voice. Will evaluate thyroid function. She can increase omeprazole to 40 mg p.o. daily to cover for the fact that reflux could be causing her symptoms.  She can also try a daily antihistamine or Flonase 2 sprays per nostril daily to cover allergy postnasal drip as source of hoarseness.  There is no red flags or indication for urgent evaluation, no airway compromise.  If ultrasound is unchanged and her symptoms are not improving with the above treatment we will consider referral to ENT for laryngoscopy.  Orders: -     US THYROID; Future  Leg cramps -     Comprehensive metabolic panel -     Magnesium -  TSH  High cholesterol -     Lipid panel    No follow-ups on file.   Kerby Nora, MD

## 2023-04-23 LAB — LIPID PANEL
Cholesterol: 286 mg/dL — ABNORMAL HIGH (ref <200)
HDL: 69 mg/dL (ref >=50)
LDL Cholesterol (Calc): 178 mg/dL — ABNORMAL HIGH
Non-HDL Cholesterol (Calc): 217 mg/dL — ABNORMAL HIGH (ref <130)
Total CHOL/HDL Ratio: 4.1 (calc) (ref <5.0)
Triglycerides: 220 mg/dL — ABNORMAL HIGH (ref <150)

## 2023-04-23 LAB — COMPREHENSIVE METABOLIC PANEL
AG Ratio: 1.3 (calc) (ref 1.0–2.5)
ALT: 19 U/L (ref 6–29)
AST: 13 U/L (ref 10–35)
Albumin: 4 g/dL (ref 3.6–5.1)
Alkaline phosphatase (APISO): 86 U/L (ref 37–153)
BUN: 22 mg/dL (ref 7–25)
CO2: 23 mmol/L (ref 20–32)
Calcium: 9.7 mg/dL (ref 8.6–10.4)
Chloride: 103 mmol/L (ref 98–110)
Creat: 0.94 mg/dL (ref 0.60–1.00)
Globulin: 3.2 g/dL (ref 1.9–3.7)
Glucose, Bld: 272 mg/dL — ABNORMAL HIGH (ref 65–99)
Potassium: 4.2 mmol/L (ref 3.5–5.3)
Sodium: 139 mmol/L (ref 135–146)
Total Bilirubin: 0.4 mg/dL (ref 0.2–1.2)
Total Protein: 7.2 g/dL (ref 6.1–8.1)

## 2023-04-23 LAB — MAGNESIUM: Magnesium: 2 mg/dL (ref 1.5–2.5)

## 2023-04-23 LAB — TSH: TSH: 0.13 m[IU]/L — ABNORMAL LOW (ref 0.40–4.50)

## 2023-04-24 ENCOUNTER — Encounter: Payer: Self-pay | Admitting: Internal Medicine

## 2023-04-25 ENCOUNTER — Other Ambulatory Visit: Payer: Self-pay | Admitting: Family Medicine

## 2023-04-26 ENCOUNTER — Ambulatory Visit
Admission: RE | Admit: 2023-04-26 | Discharge: 2023-04-26 | Disposition: A | Discharge status: Home / Self Care | Payer: Medicare Other | Source: Ambulatory Visit | Attending: Family Medicine | Admitting: Family Medicine

## 2023-04-26 DIAGNOSIS — R490 Dysphonia: Secondary | ICD-10-CM | POA: Insufficient documentation

## 2023-04-26 DIAGNOSIS — E042 Nontoxic multinodular goiter: Secondary | ICD-10-CM | POA: Diagnosis present

## 2023-04-27 ENCOUNTER — Encounter: Payer: Self-pay | Admitting: Family Medicine

## 2023-04-27 ENCOUNTER — Other Ambulatory Visit: Payer: Self-pay | Admitting: Family Medicine

## 2023-04-27 DIAGNOSIS — R7989 Other specified abnormal findings of blood chemistry: Secondary | ICD-10-CM

## 2023-04-28 ENCOUNTER — Other Ambulatory Visit: Payer: Self-pay | Admitting: Neurological Surgery

## 2023-04-28 ENCOUNTER — Encounter: Payer: Self-pay | Admitting: Family Medicine

## 2023-04-28 ENCOUNTER — Ambulatory Visit
Admission: RE | Admit: 2023-04-28 | Discharge: 2023-04-28 | Disposition: A | Discharge status: Home / Self Care | Payer: Medicare Other | Source: Ambulatory Visit | Attending: Neurological Surgery | Admitting: Neurological Surgery

## 2023-04-28 ENCOUNTER — Other Ambulatory Visit (INDEPENDENT_AMBULATORY_CARE_PROVIDER_SITE_OTHER): Payer: Medicare Other

## 2023-04-28 DIAGNOSIS — M4316 Spondylolisthesis, lumbar region: Secondary | ICD-10-CM | POA: Insufficient documentation

## 2023-04-28 DIAGNOSIS — R946 Abnormal results of thyroid function studies: Secondary | ICD-10-CM | POA: Diagnosis not present

## 2023-04-28 DIAGNOSIS — R7989 Other specified abnormal findings of blood chemistry: Secondary | ICD-10-CM

## 2023-04-28 LAB — T4, FREE: Free T4: 0.87 ng/dL (ref 0.60–1.60)

## 2023-04-28 LAB — T3, FREE: T3, Free: 3.2 pg/mL (ref 2.3–4.2)

## 2023-05-04 ENCOUNTER — Telehealth: Payer: Self-pay

## 2023-05-04 NOTE — Telephone Encounter (Signed)
[facility] update sheet received,  Vypeti 300 mg given 04/22/23 without complications. Next injection 07/21/23

## 2023-05-08 ENCOUNTER — Encounter: Payer: Self-pay | Admitting: Internal Medicine

## 2023-05-09 MED ORDER — REPAGLINIDE 0.5 MG PO TABS: 1.5000 mg | ORAL_TABLET | Freq: Two times a day (BID) | ORAL | Status: DC

## 2023-05-19 ENCOUNTER — Encounter: Payer: Self-pay | Admitting: Internal Medicine

## 2023-05-19 ENCOUNTER — Ambulatory Visit (INDEPENDENT_AMBULATORY_CARE_PROVIDER_SITE_OTHER): Payer: Medicare Other | Admitting: Internal Medicine

## 2023-05-19 VITALS — BP 134/70 | HR 102 | Ht 60.0 in | Wt 132.0 lb

## 2023-05-19 DIAGNOSIS — R7989 Other specified abnormal findings of blood chemistry: Secondary | ICD-10-CM | POA: Diagnosis not present

## 2023-05-19 DIAGNOSIS — Z794 Long term (current) use of insulin: Secondary | ICD-10-CM | POA: Diagnosis not present

## 2023-05-19 DIAGNOSIS — E1165 Type 2 diabetes mellitus with hyperglycemia: Secondary | ICD-10-CM | POA: Diagnosis not present

## 2023-05-19 LAB — POCT GLYCOSYLATED HEMOGLOBIN (HGB A1C): Hemoglobin A1C: 9.7 % — AB (ref 4.0–5.6)

## 2023-05-19 MED ORDER — REPAGLINIDE 1 MG PO TABS: ORAL_TABLET | ORAL | 2 refills | Status: DC

## 2023-05-19 MED ORDER — INSULIN PEN NEEDLE 32G X 4 MM MISC: 1.0000 | Freq: Every day | 3 refills | Status: DC

## 2023-05-19 MED ORDER — LANTUS SOLOSTAR 100 UNIT/ML ~~LOC~~ SOPN: 22.0000 [IU] | PEN_INJECTOR | Freq: Every day | SUBCUTANEOUS | 3 refills | Status: DC

## 2023-05-19 NOTE — Progress Notes (Signed)
Name: Debra Leach  MRN/ DOB: [id number], 12-15-1950   Age/ Sex: 72 y.o., female    PCP: Excell Seltzer, MD   Reason for Endocrinology Evaluation: Type 2 Diabetes Mellitus     Date of Initial Endocrinology Visit: 07/10/2021    PATIENT IDENTIFIER: Debra Leach is a 72 y.o. female with a past medical history of DM, renal stones, Autoimmune pancreatitis . The patient presented for initial endocrinology clinic visit on 07/10/2021 for consultative assistance with her diabetes management.    HPI: Debra Leach was    Diagnosed with DM 2012 Prior Medications tried/Intolerance: Metformin- GI side effects .  Currently checking blood sugars 2 x / day Hemoglobin A1c has ranged from 6.7% in 2019, peaking at 9.5% in 2022.    Has limited diet due to migraine headaches she has to eat every 3 hours otherwise her headaches will worsen  She is on long-term glucocorticoids for autoimmune pancreatitis for a long term  started  on 08/2017   On her initial visit to our clinic she had an A1c of 9.5%, she is intolerant to Jardiance due to recurrent UTIs.  We started her on repaglinide  She was also diagnosed with MNG a few years ago with last ultrasound 04/2020, thyroid nodules did not meet criteria for further follow-up.  ADRENAL HISTORY: The patient has been on budesonide for many years due to autoimmune pancreatitis.  She presented to the ED in March, 2024 with sepsis secondary to UTI and cellulitis and was noted to have a low serum cortisol which prompted cosyntropin stimulation test, and she was discharged on hydrocortisone  Her cosyntropin stimulation test  result is INVALID , due to chronic corticosteroid intake.  Patient does not need any additional glucocorticoids as she is already on budesonide   SUBJECTIVE:   During the last visit (11/12/2022): A1c 7.4%     Today (05/19/23): Debra Leach is here for follow-up on diabetes management.  She checks her glucose multiple  times daily.  She has been noted with hyperglycemia due to glucocorticoid injection, which we have adjusted her glycemic regimen   She is s/p left middle and ring finger reconstructive surgery 04/06/2023  In reviewing her records, patient has been noted with low TSH at 0.13 u IU/mL with normal free T3 and T4 in 04/2023 .  She does have history of subcentimeter thyroid nodules that did not meet criteria for follow-up.  She continues to follow-up with GI for autoimmune pancreatitis She has chronic nausea  due to migraine and pancreatitis  She eats meals a day and 2 snacks  Nausea and migraine have improved  Denies constipation or diarrhea recently  She has been noted with hoarseness, thyroid ultrasound revealing  HOME ENDOCRINE  REGIMEN: Lantus 18 units daily  Repaglinide 0.5 mg, 3 tabs before lunch and 3 tablets before supper     Statin: INtolerant to crestor  ACE-I/ARB: yes Prior Diabetic Education: no    CONTINUOUS GLUCOSE MONITORING RECORD INTERPRETATION    Dates of Recording: 11/29-12/05/2023  Sensor description: Dexcom  Results statistics:   CGM use % of time 93  Average and SD 276/63  Time in range 6%  % Time Above 180 30  % Time above 250 64  % Time Below target 0   Glycemic patterns summary: BGs are high throughout The day and night  Hyperglycemic episodes postprandial  Hypoglycemic episodes occurred N/A  Overnight periods: High       DIABETIC COMPLICATIONS: Microvascular complications:  CKDIII Denies: Neuropathy,  retinopathy Last eye exam: Completed   Macrovascular complications:   Denies: CAD, PVD, CVA   PAST HISTORY: Past Medical History:  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HCC)    Clotting disorder (HCC)    Complication of anesthesia    difficult waking   Diabetes mellitus without complication (HCC)    Diet and exercise  controlled   Diverticulosis    DVT (deep venous thrombosis) (HCC) 02/2019   3calf 2 right lung 1 left lung   DVT (deep venous thrombosis) (HCC) 2020   right leg   Dyspnea    GERD (gastroesophageal reflux disease)    Headache    Hepatic steatosis    Hiatal hernia    History of kidney stones    Hyperlipidemia    MVP (mitral valve prolapse)    history of   PONV (postoperative nausea and vomiting)    Pulmonary emboli (HCC) 03/01/2019   2 in the right; 1 in the left   Past Surgical History:  Past Surgical History:  Procedure Laterality Date   arm surgery  2012   left forearm fracture w/ plates pins screws   ARTERY BIOPSY Right 12/03/2021   Procedure: BIOPSY TEMPORAL ARTERY;  Surgeon: Annice Needy, MD;  Location: [facility];  Service: Vascular;  Laterality: Right;  Right temple   BREAST BIOPSY Left    BREAST SURGERY  2000   breast biopsy (benign)   CATARACT EXTRACTION Right 2005   CATARACT EXTRACTION Left 2010   LITHOTRIPSY  08/12/2008   stent placed bilaterally   PANCREAS BIOPSY  2021   PANCREAS BIOPSY  2019   PARTIAL HYSTERECTOMY     Both ovaries remain, vaginal, for mennorhagia   REPAIR EXTENSOR TENDON Right 06/30/2022   Procedure: Middle and ring finger sagittal band reconstruction;  Surgeon: Marlyne Beards, MD;  Location: [facility];  Service: Orthopedics;  Laterality: Right;   TENDON REPAIR Left 04/06/2023   Procedure: MIDDLE AND RING FINGER SAGITTAL BAND RECONSTRUCTION;  Surgeon: Marlyne Beards, MD;  Location: [facility];  Service: Orthopedics;  Laterality: Left;  regional120   TONSILLECTOMY     TUBAL LIGATION  1980    Social History:  reports that she quit smoking about 31 years ago. Her smoking use included cigarettes. She started smoking about 44 years ago. She has a 13 pack-year smoking history. She has never been exposed to tobacco smoke. She has never used smokeless tobacco. She reports that she does not drink alcohol and does not use drugs. Family  History:  Family History  Problem Relation Age of Onset   Bone cancer Mother    Hypertension Father    Mitral valve prolapse Father    Asthma Brother    Arthritis Brother    Nephrolithiasis Brother    Asthma Brother    COPD Brother    Breast cancer Maternal Aunt    Breast cancer Maternal Aunt    Colon cancer Neg Hx    Esophageal cancer Neg Hx    Inflammatory bowel disease Neg Hx    Liver disease Neg Hx    Pancreatic cancer Neg Hx    Rectal cancer Neg Hx    Stomach cancer Neg Hx      HOME MEDICATIONS: Allergies as of 05/19/2023       Reactions   Clindamycin/lincomycin Anaphylaxis   Sudafed [pseudoephedrine Hcl] Hives, Itching, Anxiety  Atenolol Other (See Comments)   Migraine   Codeine Itching, Swelling   REACTION: Migraine   Epinephrine Itching   Hydrocodone Other (See Comments)   Migraine   Ibuprofen Other (See Comments)   Migraine   Jardiance [empagliflozin] Other (See Comments)   Caused uti Yeast infection   Metronidazole Swelling   Mouth and lips   Oxycodone Other (See Comments)   Migraine   Penicillins Itching, Swelling   ** Tolerates cephalosporins Has patient had a PCN reaction causing immediate rash, facial/tongue/throat swelling, SOB or lightheadedness with hypotension: No Has patient had a PCN reaction causing severe rash involving mucus membranes or skin necrosis: No Has patient had a PCN reaction that required hospitalization: No Has patient had a PCN reaction occurring within the last 10 years: No   Promethazine Other (See Comments)   migraine   Propranolol Other (See Comments)   Migraine   Talwin [pentazocine] Other (See Comments)   Swelling and itching    Tramadol Other (See Comments)   Migraine   Vancomycin Swelling   Swelling of Lips and Mouth   Crestor [rosuvastatin] Other (See Comments)   Muscle pain        Medication List        Accurate as of May 19, 2023  9:10 AM. If you have any questions, ask your nurse or doctor.           aluminum-magnesium hydroxide 200-200 MG/5ML suspension Take by mouth every 6 (six) hours as needed for indigestion. As needed   atorvastatin 80 MG tablet Commonly known as: LIPITOR TAKE 1 TABLET BY MOUTH DAILY   budesonide 3 MG 24 hr capsule Commonly known as: ENTOCORT EC TAKE 2 CAPSULES BY MOUTH DAILY.   colesevelam 625 MG tablet Commonly known as: WELCHOL TAKE 3 TABLETS BY MOUTH 2 TIMES DAILY WITH A MEAL.   cyanocobalamin 1000 MCG tablet Commonly known as: VITAMIN B12 Take 1,000 mcg by mouth daily with breakfast.   Dexcom G7 Sensor Misc 1 Device by Does not apply route as directed.   Eliquis 2.5 MG Tabs tablet Generic drug: apixaban TAKE 1 TABLET BY MOUTH 2 TIMES DAILY.   ferrous gluconate 324 MG tablet Commonly known as: FERGON Take 1 tablet (324 mg total) by mouth daily with breakfast.   gabapentin 100 MG capsule Commonly known as: NEURONTIN TAKE 1 CAPSULE BY MOUTH UP TO 3 TIMES DAILY AS NEEDED.   gabapentin 300 MG capsule Commonly known as: NEURONTIN Take 2 capsules (600 mg total) by mouth at bedtime.   Insulin Pen Needle 32G X 4 MM Misc by Does not apply route.   Lantus 100 UNIT/ML injection Generic drug: insulin glargine Inject 18 Units into the skin daily. What changed: Another medication with the same name was removed. Continue taking this medication, and follow the directions you see here.   meclizine 25 MG tablet Commonly known as: ANTIVERT Take 1 tablet (25 mg total) by mouth 3 (three) times daily as needed for dizziness.   ondansetron 4 MG tablet Commonly known as: Zofran Take 1 tablet (4 mg total) by mouth every 8 (eight) hours as needed for nausea or vomiting.   OneTouch Delica Plus Lancet30G Misc USE TO CHECK BLOOD SUGAR TWO TIMES DAILY   OneTouch Verio Reflect w/Device Kit TEST TWICE A DAY   OneTouch Verio test strip Generic drug: glucose blood USE TO CHECK BLOOD SUGAR TWO TIMES A DAY   potassium chloride SA 20 MEQ  tablet Commonly known as: KLOR-CON M TAKE 1  TABLET BY MOUTH DAILY.   repaglinide 0.5 MG tablet Commonly known as: PRANDIN Take 3 tablets (1.5 mg total) by mouth 2 (two) times daily before a meal. Take 2 tablets (1 mg) by mouth daily before lunch AND 1 tablet (0.5 mg) daily before supper   Ubrelvy 100 MG Tabs Generic drug: Ubrogepant Take 1 tablet (100 mg total) by mouth as needed (May repeat dose after 2 hours if needed.  Maximum 2 tablets in 24 hours).   Vitamin D (Ergocalciferol) 1.25 MG (50000 UNIT) Caps capsule Commonly known as: DRISDOL TAKE 1 CAPSULE BY MOUTH ONCE A WEEK.   Vyepti 100 MG/ML injection Generic drug: Eptinezumab-jjmr Inject 300 mg into the vein every 3 (three) months.         ALLERGIES: Allergies  Allergen Reactions   Clindamycin/Lincomycin Anaphylaxis   Sudafed [Pseudoephedrine Hcl] Hives, Itching and Anxiety   Atenolol Other (See Comments)    Migraine   Codeine Itching and Swelling    REACTION: Migraine   Epinephrine Itching   Hydrocodone Other (See Comments)    Migraine   Ibuprofen Other (See Comments)    Migraine   Jardiance [Empagliflozin] Other (See Comments)    Caused uti Yeast infection   Metronidazole Swelling    Mouth and lips   Oxycodone Other (See Comments)    Migraine   Penicillins Itching and Swelling    ** Tolerates cephalosporins Has patient had a PCN reaction causing immediate rash, facial/tongue/throat swelling, SOB or lightheadedness with hypotension: No Has patient had a PCN reaction causing severe rash involving mucus membranes or skin necrosis: No Has patient had a PCN reaction that required hospitalization: No Has patient had a PCN reaction occurring within the last 10 years: No     Promethazine Other (See Comments)    migraine   Propranolol Other (See Comments)    Migraine   Talwin [Pentazocine] Other (See Comments)    Swelling and itching    Tramadol Other (See Comments)    Migraine   Vancomycin Swelling     Swelling of Lips and Mouth   Crestor [Rosuvastatin] Other (See Comments)    Muscle pain     REVIEW OF SYSTEMS: A comprehensive ROS was conducted with the patient and is negative except as per HPI    OBJECTIVE:   VITAL SIGNS: BP 134/70 (BP Location: Left Arm, Patient Position: Sitting, Cuff Size: Small)   Pulse (!) 102   Ht 5' (1.524 m)   Wt 132 lb (59.9 kg)   SpO2 99%   BMI 25.78 kg/m    PHYSICAL EXAM:  General: Pt appears well and is in NAD  Neck: General: Supple without adenopathy or carotid bruits. Thyroid: Thyroid size normal.  No goiter or nodules appreciated.   Lungs: Clear with good BS bilat   Heart: RRR   Extremities:  Lower extremities - tace pretibial edema.  Neuro: MS is good with appropriate affect, pt is alert and Ox3    DM foot exam: 11/12/2022  The skin of the feet is intact without sores or ulcerations. The pedal pulses are 2+ on right and 2+ on left. The sensation is intact to a screening 5.07, 10 gram monofilament bilaterally   DATA REVIEWED:  Lab Results  Component Value Date   HGBA1C 9.7 (A) 05/19/2023   HGBA1C 7.4 (A) 11/12/2022   HGBA1C 8.4 (H) 08/16/2022    Latest Reference Range & Units 04/22/23 14:39  Sodium 135 - 146 mmol/L 139  Potassium 3.5 - 5.3 mmol/L 4.2  Chloride 98 - 110 mmol/L 103  CO2 20 - 32 mmol/L 23  Glucose 65 - 99 mg/dL 811 (H)  BUN 7 - 25 mg/dL 22  Creatinine 9.14 - 7.82 mg/dL 9.56  Calcium 8.6 - 21.3 mg/dL 9.7  BUN/Creatinine Ratio 6 - 22 (calc) SEE NOTE:  Magnesium 1.5 - 2.5 mg/dL 2.0  AG Ratio 1.0 - 2.5 (calc) 1.3  AST 10 - 35 U/L 13  ALT 6 - 29 U/L 19  Total Protein 6.1 - 8.1 g/dL 7.2  Total Bilirubin 0.2 - 1.2 mg/dL 0.4     Latest Reference Range & Units 04/22/23 14:39 04/28/23 07:38  TSH 0.40 - 4.50 mIU/L 0.13 (L)   Triiodothyronine,Free,Serum 2.3 - 4.2 pg/mL  3.2  T4,Free(Direct) 0.60 - 1.60 ng/dL  0.86   Thyroid Ultrasound 04/26/2023    Narrative & Impression  CLINICAL DATA:  Goiter.  Hoarseness    EXAM: THYROID ULTRASOUND   TECHNIQUE: Ultrasound examination of the thyroid gland and adjacent soft tissues was performed.   COMPARISON:  Prior thyroid ultrasound 04/17/2020   FINDINGS: Parenchymal Echotexture: Mildly heterogenous   Isthmus: 0.3 cm   Right lobe: 4.7 x 1.0 x 1.4 cm   Left lobe: 4.2 x 1.2 x 1.3 cm   _________________________________________________________   Estimated total number of nodules >/= 1 cm: 3   Number of spongiform nodules >/=  2 cm not described below (TR1): 0   Number of mixed cystic and solid nodules >/= 1.5 cm not described below (TR2): 0   _________________________________________________________   Small heterogeneous thyroid gland. Innumerable small cysts and nodules again noted throughout the gland.   No significant interval change in the size or appearance of any individual nodule dating back to 2021. as before, no individual nodule meets criteria to warrant biopsy or dedicated imaging surveillance.   IMPRESSION: No significant interval change in the size or appearance of the small, heterogeneous and multinodular thyroid gland.     ASSESSMENT / PLAN / RECOMMENDATIONS:   1) Type 2 Diabetes Mellitus, Poorly Controlled, With fluctuating GFR  - Most recent A1c of 9.7 %. Goal A1c < 7.0 %.    -A1c has trended up from 7.4% to 9.7% -Patient is not able to follow a low-carb diet due to autoimmune pancreatitis,chronic GI symptoms, and migraine headaches which limits her dietary options.   -Our goal is to prevent hypoglycemia and severe hyperglycemia - Pt with hx of pancreatitis hence GLP-1 agonist and DPP 4 inhibitors as well as Tirzepatide are all contraindicated - Intolerant to Jardiance due to recurrent genital infections  -Patient has been noted with persistent hyperglycemia, I will increase her Lantus as below as well as repaglinide, she does understand that we will have a low threshold in starting prandial insulin if hyperglycemia  persists  MEDICATIONS: Increase Lantus 22 units daily Change Repaglinidie 1 mg, 1 mg before breakfast, 2 mg Before Lunch , 2 mg before supper  EDUCATION / INSTRUCTIONS: BG monitoring instructions: Patient is instructed to check her blood sugars 1 times a day, fasting. Call [facility] if: BG persistently < 70  I reviewed the Rule of 15 for the treatment of hypoglycemia in detail with the patient. Literature supplied.   2) Diabetic complications:  Eye: Does not have known diabetic retinopathy.  Neuro/ Feet: Does not have known diabetic peripheral neuropathy. Renal: Patient does  have known baseline CKD. She is  on an ACEI/ARB at present.   3) Low TSH :  -Patient is clinically euthyroid -Thyroid ultrasound  unrevealing except for stable subcentimeter nodules that did not meet criteria for follow-up -Repeat TFTs have normalized -TRAb pending     Follow-up in 6 months      Signed electronically by: Lyndle Herrlich, MD  [facility]  [facility] [address] [place], [place] [address] Phone: [phone] FAX: [phone]   CC: Excell Seltzer, MD [address] [place] [address] Phone: [phone]  Fax: [phone]    Return to Endocrinology clinic as below: Future Appointments  Date Time Provider Department Center  08/02/2023  8:50 AM [facility] ANNUAL WELLNESS VISIT 1 [facility]  08/26/2023  8:00 AM [facility] LAB [facility]  08/30/2023  9:10 AM Drema Dallas, DO [facility] None  09/02/2023 10:20 AM Excell Seltzer, MD [facility]

## 2023-05-19 NOTE — Patient Instructions (Addendum)
-   Increase  Lantus 22 units daily  - Change  Repaglinide 1 mg  before Breakfast,  2 mg  before Lunch  and 2 mg before Supper     HOW TO TREAT LOW BLOOD SUGARS (Blood sugar LESS THAN 70 MG/DL) Please follow the RULE OF 15 for the treatment of hypoglycemia treatment (when your (blood sugars are less than 70 mg/dL)   STEP 1: Take 15 grams of carbohydrates when your blood sugar is low, which includes:  3-4 GLUCOSE TABS  OR 3-4 OZ OF JUICE OR REGULAR SODA OR ONE TUBE OF GLUCOSE GEL    STEP 2: RECHECK blood sugar in 15 MINUTES STEP 3: If your blood sugar is still low at the 15 minute recheck --> then, go back to STEP 1 and treat AGAIN with another 15 grams of carbohydrates.

## 2023-05-21 LAB — TSH: TSH: 0.74 m[IU]/L (ref 0.40–4.50)

## 2023-05-21 LAB — TRAB (TSH RECEPTOR BINDING ANTIBODY): TRAB: <1 [IU]/L (ref <=2.00)

## 2023-05-21 LAB — T3, FREE: T3, Free: 2.8 pg/mL (ref 2.3–4.2)

## 2023-05-21 LAB — T4, FREE: Free T4: 1.3 ng/dL (ref 0.8–1.8)

## 2023-05-31 ENCOUNTER — Telehealth: Payer: Self-pay | Admitting: Family Medicine

## 2023-05-31 NOTE — Telephone Encounter (Signed)
Vernona Rieger from [facility] is faxing over a form (detailed written order care form) For Dr. Ermalene Searing to fill out and sign.

## 2023-05-31 NOTE — Telephone Encounter (Signed)
Duplicate encounter with same message.

## 2023-05-31 NOTE — Telephone Encounter (Signed)
Noted.  Will be on the look out for the fax.

## 2023-05-31 NOTE — Telephone Encounter (Signed)
Received order from [facility].  Placed in Dr. Daphine Deutscher office for signature. (Stamps are not acceptable).

## 2023-06-29 ENCOUNTER — Ambulatory Visit (HOSPITAL_BASED_OUTPATIENT_CLINIC_OR_DEPARTMENT_OTHER): Admit: 2023-06-29 | Payer: Medicare Other | Admitting: Orthopedic Surgery

## 2023-06-29 ENCOUNTER — Encounter (HOSPITAL_BASED_OUTPATIENT_CLINIC_OR_DEPARTMENT_OTHER): Payer: Self-pay

## 2023-06-29 SURGERY — TRANSFER, TENDON
Anesthesia: Monitor Anesthesia Care | Laterality: Left

## 2023-07-11 ENCOUNTER — Encounter: Payer: Self-pay | Admitting: Family Medicine

## 2023-07-13 ENCOUNTER — Other Ambulatory Visit: Payer: Self-pay

## 2023-07-13 ENCOUNTER — Encounter: Payer: Self-pay | Admitting: Internal Medicine

## 2023-07-13 ENCOUNTER — Encounter: Payer: Self-pay | Admitting: Neurology

## 2023-07-13 MED ORDER — DEXCOM G7 SENSOR MISC: 1.0000 | 3 refills | Status: AC

## 2023-07-15 ENCOUNTER — Encounter: Payer: Self-pay | Admitting: Internal Medicine

## 2023-07-18 ENCOUNTER — Other Ambulatory Visit: Payer: Self-pay | Admitting: Family Medicine

## 2023-07-18 NOTE — Telephone Encounter (Signed)
 Last office visit 04/22/2023 for hoarseness.  Last refilled 07/20/2022 for #12 with 3 refills. Vit D level 12/29/22 which was normal at 49.39.  Next Appt: CPE 09/02/2023

## 2023-07-24 ENCOUNTER — Encounter: Payer: Self-pay | Admitting: Internal Medicine

## 2023-08-01 ENCOUNTER — Telehealth: Payer: Self-pay | Admitting: *Deleted

## 2023-08-01 DIAGNOSIS — Z794 Long term (current) use of insulin: Secondary | ICD-10-CM

## 2023-08-01 DIAGNOSIS — E1169 Type 2 diabetes mellitus with other specified complication: Secondary | ICD-10-CM

## 2023-08-01 NOTE — Telephone Encounter (Signed)
-----   Message from Lovena Neighbours sent at 08/01/2023  3:03 PM EST ----- Regarding: Fasting labs for Friday 3.21.25 Please put physical lab orders in future. Thank you, Denny Peon

## 2023-08-02 ENCOUNTER — Ambulatory Visit (INDEPENDENT_AMBULATORY_CARE_PROVIDER_SITE_OTHER): Payer: Medicare Other

## 2023-08-02 VITALS — Ht 60.0 in | Wt 132.0 lb

## 2023-08-02 DIAGNOSIS — Z0000 Encounter for general adult medical examination without abnormal findings: Secondary | ICD-10-CM

## 2023-08-02 NOTE — Progress Notes (Signed)
 Subjective:   Debra Leach is a 73 y.o. who presents for a Medicare Wellness preventive visit.  Visit Complete: Virtual I connected with  [name] on 08/02/23 by a audio enabled telemedicine application and verified that I am speaking with the correct person using two identifiers.  Patient Location: Home  Provider Location: Home Office  I discussed the limitations of evaluation and management by telemedicine. The patient expressed understanding and agreed to proceed.  Vital Signs: Because this visit was a virtual/telehealth visit, some criteria may be missing or patient reported. Any vitals not documented were not able to be obtained and vitals that have been documented are patient reported.  VideoDeclined- This patient declined Librarian, academic. Therefore the visit was completed with audio only.  AWV Questionnaire: Yes: Patient Medicare AWV questionnaire was completed by the patient on 07/26/23; I have confirmed that all information answered by patient is correct and no changes since this date.  Cardiac Risk Factors include: advanced age (>31men, >56 women);diabetes mellitus;hypertension;dyslipidemia     Objective:    Today's Vitals   08/02/23 0852  Weight: 132 lb (59.9 kg)  Height: 5' (1.524 m)   Body mass index is 25.78 kg/m.     08/02/2023    9:19 AM 04/06/2023    6:44 AM 02/15/2023    9:39 AM 09/13/2022    9:23 AM 08/19/2022   10:56 PM 08/19/2022    5:19 PM 08/17/2022    7:48 AM  Advanced Directives  Does Patient Have a Medical Advance Directive? Yes Yes No Yes Yes Yes No  Type of Estate agent of [place];Living will Healthcare Power of [place];Living will  Healthcare Power of [place];Living will Healthcare Power of [place];Living will Healthcare Power of [place];Living will   Does patient want to make changes to medical advance directive?  No - Patient declined  No - Patient declined No -  Patient declined    Copy of Healthcare Power of Attorney in Chart? Yes - validated most recent copy scanned in chart (See row information) No - copy requested  Yes - validated most recent copy scanned in chart (See row information) Yes - validated most recent copy scanned in chart (See row information)    Would patient like information on creating a medical advance directive?    No - Patient declined No - Patient declined  No - Patient declined    Current Medications (verified) Outpatient Encounter Medications as of 08/02/2023  Medication Sig   aluminum-magnesium hydroxide 200-200 MG/5ML suspension Take by mouth every 6 (six) hours as needed for indigestion. As needed   apixaban (ELIQUIS) 2.5 MG TABS tablet TAKE 1 TABLET BY MOUTH 2 TIMES DAILY.   atorvastatin (LIPITOR) 80 MG tablet TAKE 1 TABLET BY MOUTH DAILY   Blood Glucose Monitoring Suppl (ONETOUCH VERIO REFLECT) w/Device KIT TEST TWICE A DAY   budesonide (ENTOCORT EC) 3 MG 24 hr capsule TAKE 2 CAPSULES BY MOUTH DAILY.   colesevelam (WELCHOL) 625 MG tablet TAKE 3 TABLETS BY MOUTH 2 TIMES DAILY WITH A MEAL.   Continuous Glucose Sensor (DEXCOM G7 SENSOR) MISC 1 Device by Does not apply route as directed.   Eptinezumab-jjmr (VYEPTI) 100 MG/ML injection Inject 300 mg into the vein every 3 (three) months.   ferrous gluconate (FERGON) 324 MG tablet TAKE 1 TABLET (324 MG TOTAL) BY MOUTH DAILY WITH BREAKFAST.   gabapentin (NEURONTIN) 100 MG capsule TAKE 1 CAPSULE BY MOUTH UP TO 3 TIMES DAILY AS NEEDED.  gabapentin (NEURONTIN) 300 MG capsule Take 2 capsules (600 mg total) by mouth at bedtime.   insulin glargine (LANTUS SOLOSTAR) 100 UNIT/ML Solostar Pen Inject 22 Units into the skin daily.   Insulin Pen Needle 32G X 4 MM MISC 1 Device by Does not apply route daily in the afternoon.   Lancets (ONETOUCH DELICA PLUS LANCET30G) MISC USE TO CHECK BLOOD SUGAR TWO TIMES DAILY   meclizine (ANTIVERT) 25 MG tablet Take 1 tablet (25 mg total) by mouth 3  (three) times daily as needed for dizziness.   ondansetron (ZOFRAN) 4 MG tablet Take 1 tablet (4 mg total) by mouth every 8 (eight) hours as needed for nausea or vomiting.   ONETOUCH VERIO test strip USE TO CHECK BLOOD SUGAR TWO TIMES A DAY   potassium chloride SA (KLOR-CON M) 20 MEQ tablet TAKE 1 TABLET BY MOUTH DAILY.   repaglinide (PRANDIN) 1 MG tablet Take 1 tablet (1 mg total) by mouth daily before breakfast AND 2 tablets (2 mg total) daily before lunch AND 2 tablets (2 mg total) daily before supper.   Ubrogepant (UBRELVY) 100 MG TABS Take 1 tablet (100 mg total) by mouth as needed (May repeat dose after 2 hours if needed.  Maximum 2 tablets in 24 hours).   vitamin B-12 (CYANOCOBALAMIN) 1000 MCG tablet Take 1,000 mcg by mouth daily with breakfast.   Vitamin D, Ergocalciferol, (DRISDOL) 1.25 MG (50000 UNIT) CAPS capsule TAKE 1 CAPSULE BY MOUTH ONCE A WEEK.   No facility-administered encounter medications on file as of 08/02/2023.    Allergies (verified) Clindamycin/lincomycin, Sudafed [pseudoephedrine hcl], Atenolol, Codeine, Epinephrine, Hydrocodone, Ibuprofen, Jardiance [empagliflozin], Metronidazole, Oxycodone, Penicillins, Promethazine, Propranolol, Talwin [pentazocine], Tramadol, Vancomycin, and Crestor [rosuvastatin]   History: 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HCC)    Clotting disorder (HCC)    Complication of anesthesia    difficult waking   Diabetes mellitus without complication (HCC)    Diet and exercise controlled   Diverticulosis    DVT (deep venous thrombosis) (HCC) 02/2019   3calf 2 right lung 1 left lung   DVT (deep venous thrombosis) (HCC) 2020   right leg   Dyspnea    GERD (gastroesophageal reflux disease)    Headache    Hepatic steatosis    Hiatal hernia    History of kidney stones    Hyperlipidemia    MVP (mitral valve prolapse)    history of   PONV  (postoperative nausea and vomiting)    Pulmonary emboli (HCC) 03/01/2019   2 in the right; 1 in the left   Past Surgical History:  Procedure Laterality Date   arm surgery  2012   left forearm fracture w/ plates pins screws   ARTERY BIOPSY Right 12/03/2021   Procedure: BIOPSY TEMPORAL ARTERY;  Surgeon: Annice Needy, MD;  Location: [facility] ORS;  Service: Vascular;  Laterality: Right;  Right temple   BREAST BIOPSY Left    BREAST SURGERY  2000   breast biopsy (benign)   CATARACT EXTRACTION Right 2005   CATARACT EXTRACTION Left 2010   LITHOTRIPSY  08/12/2008   stent placed bilaterally   PANCREAS BIOPSY  2021   PANCREAS BIOPSY  2019   PARTIAL HYSTERECTOMY     Both ovaries remain, vaginal, for mennorhagia   REPAIR EXTENSOR TENDON Right 06/30/2022   Procedure: Middle and ring finger sagittal band reconstruction;  Surgeon: Frazier Butt,  Billey Gosling, MD;  Location: [facility] OR;  Service: Orthopedics;  Laterality: Right;   TENDON REPAIR Left 04/06/2023   Procedure: MIDDLE AND RING FINGER SAGITTAL BAND RECONSTRUCTION;  Surgeon: Marlyne Beards, MD;  Location: [facility];  Service: Orthopedics;  Laterality: Left;  regional120   TONSILLECTOMY     TUBAL LIGATION  1980   Family History  Problem Relation Age of Onset   Bone cancer Mother    Hypertension Father    Mitral valve prolapse Father    Asthma Brother    Arthritis Brother    Nephrolithiasis Brother    Asthma Brother    COPD Brother    Breast cancer Maternal Aunt    Breast cancer Maternal Aunt    Colon cancer Neg Hx    Esophageal cancer Neg Hx    Inflammatory bowel disease Neg Hx    Liver disease Neg Hx    Pancreatic cancer Neg Hx    Rectal cancer Neg Hx    Stomach cancer Neg Hx    Social History   Socioeconomic History   Marital status: Married    Spouse name: Channing Mutters   Number of children: 0   Years of education: Not on file   Highest education level: Bachelor's degree (e.g., BA, AB, BS)  Occupational History   Occupation:  retired Freight forwarder: retired  Tobacco Use   Smoking status: Former    Current packs/day: 0.00    Average packs/day: 1 pack/day for 13.0 years (13.0 ttl pk-yrs)    Types: Cigarettes    Start date: 06/07/1978    Quit date: 06/08/1991    Years since quitting: 32.1    Passive exposure: Never   Smokeless tobacco: Never  Vaping Use   Vaping status: Never Used  Substance and Sexual Activity   Alcohol use: No   Drug use: No   Sexual activity: Not on file  Other Topics Concern   Not on file  Social History Narrative   Regular exercise--yes, recumbent bike 3-4 days a week      Diet: fruits and veggies, water, eats at home, drinks a lot of milk      Patient is right-handed. She drinks 1-2 cups of coffee a day.      One story home   Social Drivers of Health   Financial Resource Strain: Low Risk  (08/02/2023)   Overall Financial Resource Strain (CARDIA)    Difficulty of Paying Living Expenses: Not hard at all  Food Insecurity: No Food Insecurity (08/02/2023)   Hunger Vital Sign    Worried About Running Out of Food in the Last Year: Never true    Ran Out of Food in the Last Year: Never true  Transportation Needs: No Transportation Needs (08/02/2023)   PRAPARE - Administrator, Civil Service (Medical): No    Lack of Transportation (Non-Medical): No  Physical Activity: Insufficiently Active (08/02/2023)   Exercise Vital Sign    Days of Exercise per Week: 3 days    Minutes of Exercise per Session: 40 min  Stress: No Stress Concern Present (08/02/2023)   [employer] of Occupational Health - Occupational Stress Questionnaire    Feeling of Stress : Not at all  Social Connections: Moderately Isolated (08/02/2023)   Social Connection and Isolation Panel [NHANES]    Frequency of Communication with Friends and Family: Twice a week    Frequency of Social Gatherings with Friends and Family: Twice a week    Attends Religious Services:  Never    Active Member of Clubs  or Organizations: No    Attends Banker Meetings: Never    Marital Status: Married    Tobacco Counseling Counseling given: Not Answered    Clinical Intake:  Pre-visit preparation completed: Yes  Pain : No/denies pain     BMI - recorded: 25.78 Nutritional Status: BMI 25 -29 Overweight Nutritional Risks: None Diabetes: Yes CBG done?: Yes (BS 126 at home at 6am this morning) CBG resulted in Enter/ Edit results?: No Did pt. bring in CBG monitor from home?: No  How often do you need to have someone help you when you read instructions, pamphlets, or other written materials from your doctor or pharmacy?: 1 - Never  Interpreter Needed?: No  Comments: lives with husband Information entered by :: B.Devondre Guzzetta,LPN   Activities of Daily Living     07/26/2023   11:30 AM 04/06/2023    6:34 AM  In your present state of health, do you have any difficulty performing the following activities:  Hearing? 0 0  Vision? 0 0  Difficulty concentrating or making decisions? 0 0  Walking or climbing stairs? 1   Dressing or bathing? 0   Doing errands, shopping? 0   Using the Toilet? N   In the past six months, have you accidently leaked urine? Y   Do you have problems with loss of bowel control? Y   Managing your Medications? N   Managing your Finances? N   Housekeeping or managing your Housekeeping? N     Patient Care Team: Excell Seltzer, MD as PCP - General (Family Medicine) Drema Dallas, DO as Consulting Physician (Neurology) Vilinda Flake, [facility] (Inactive) as Pharmacist (Pharmacist) [facility], Konrad Dolores, MD as Attending Physician (Endocrinology) Domingo Madeira, OD (Optometry)  Indicate any recent Medical Services you may have received from other than [facility] providers in the past year (date may be approximate).     Assessment:   This is a routine wellness examination for Deonna.  Hearing/Vision screen Hearing Screening - Comments:: Pt says her hearing is  good with aids for watching TV Vision Screening - Comments:: Pt says her vision is good with glasses Yearly visits in July w/Dr K Nice   Goals Addressed             This Visit's Progress    COMPLETED: Patient Stated       04/06/2019, I will try to increase my exercise so I can lose some weight.      Patient Stated       08/02/23-pt wants to continue to exercise       Depression Screen     08/02/2023    9:14 AM 04/12/2023    9:32 AM 09/13/2022    9:22 AM 08/25/2022    9:28 AM 05/11/2022    9:14 AM 08/20/2021    8:53 AM 07/15/2020    9:41 AM  PHQ 2/9 Scores  PHQ - 2 Score 0 0 0 0 0 0 0  PHQ- 9 Score  0 0 7   6    Fall Risk     07/26/2023   11:30 AM 04/12/2023    9:31 AM 02/15/2023    9:39 AM 09/13/2022    9:23 AM 09/10/2022   11:24 AM  Fall Risk   Falls in the past year? 1 0 0 1 1  Number falls in past yr: 1 0 0 0 0  Comment    passed out  Injury with Fall? 0 0 0 0 0  Risk for fall due to : History of fall(s) No Fall Risks  No Fall Risks   Risk for fall due to: Comment 2 trips and 2 episodes of passing out      Follow up Falls prevention discussed;Education provided Falls evaluation completed Falls evaluation completed Falls prevention discussed;Falls evaluation completed     MEDICARE RISK AT HOME:  Medicare Risk at Home Any stairs in or around the home?: (Patient-Rptd) Yes If so, are there any without handrails?: (Patient-Rptd) No Home free of loose throw rugs in walkways, pet beds, electrical cords, etc?: (Patient-Rptd) Yes Adequate lighting in your home to reduce risk of falls?: (Patient-Rptd) Yes Life alert?: (Patient-Rptd) No Use of a cane, walker or w/c?: (Patient-Rptd) No Grab bars in the bathroom?: (Patient-Rptd) Yes Shower chair or bench in shower?: (Patient-Rptd) Yes Elevated toilet seat or a handicapped toilet?: (Patient-Rptd) No  TIMED UP AND GO:  Was the test performed?  No  Cognitive Function: 6CIT completed    04/06/2019    9:15 AM  MMSE - Mini  Mental State Exam  Orientation to time 5  Orientation to Place 5  Registration 3  Attention/ Calculation 5  Recall 3  Language- repeat 1        08/02/2023    9:21 AM 09/13/2022    9:28 AM  6CIT Screen  What Year? 0 points 0 points  What month? 0 points 0 points  What time? 0 points 0 points  Count back from 20 0 points 0 points  Months in reverse 0 points 0 points  Repeat phrase 0 points 0 points  Total Score 0 points 0 points    Immunizations Immunization History  Administered Date(s) Administered   Fluad Quad(high Dose 65+) 02/15/2019, 04/15/2022   Fluad Trivalent(High Dose 65+) 02/04/2023   Influenza Split 02/15/2012   Influenza Whole 04/24/2009, 02/26/2010   Influenza, High Dose Seasonal PF 02/28/2020, 04/03/2021   Influenza,inj,Quad PF,6+ Mos 02/15/2013, 02/19/2014, 02/21/2015, 02/24/2016, 03/22/2017, 02/07/2018   Moderna Covid-19 Fall Seasonal Vaccine 30yrs & older 04/15/2022, 02/04/2023   Moderna SARS-COV2 Booster Vaccination 07/08/2021   PFIZER(Purple Top)SARS-COV-2 Vaccination 06/29/2019, 07/20/2019, 03/07/2020, 10/09/2020   PNEUMOCOCCAL CONJUGATE-20 02/04/2023   Pneumococcal Conjugate-13 05/18/2016   Pneumococcal Polysaccharide-23 02/21/2015, 07/15/2020   Respiratory Syncytial Virus Vaccine,Recomb Aduvanted(Arexvy) 04/15/2022   Td 06/07/2006, 04/09/2021   Tdap 02/24/2016   Zoster Recombinant(Shingrix) 04/12/2019, 08/15/2019   Zoster, Live 05/27/2011    Screening Tests Health Maintenance  Topic Date Due   Diabetic kidney evaluation - Urine ACR  08/16/2023   FOOT EXAM  11/12/2023   HEMOGLOBIN A1C  11/17/2023   OPHTHALMOLOGY EXAM  12/16/2023   DEXA SCAN  02/20/2024   MAMMOGRAM  03/09/2024   Diabetic kidney evaluation - eGFR measurement  04/21/2024   Medicare Annual Wellness (AWV)  08/01/2024   Colonoscopy  07/24/2030   DTaP/Tdap/Td (4 - Td or Tdap) 04/10/2031   Pneumonia Vaccine 46+ Years old  Completed   INFLUENZA VACCINE  Completed   Hepatitis C  Screening  Completed   Zoster Vaccines- Shingrix  Completed   HPV VACCINES  Aged Out   COVID-19 Vaccine  Discontinued    Health Maintenance  Health Maintenance Due  Topic Date Due   Diabetic kidney evaluation - Urine ACR  08/16/2023   Health Maintenance Items Addressed: NONE  Additional Screening:  Vision Screening: Recommended annual ophthalmology exams for early detection of glaucoma and other disorders of the eye.  Dental Screening: Recommended annual dental  exams for proper oral hygiene  Community Resource Referral / Chronic Care Management: CRR required this visit?  No   CCM required this visit?  No     Plan:     I have personally reviewed and noted the following in the patient's chart:   Medical and social history Use of alcohol, tobacco or illicit drugs  Current medications and supplements including opioid prescriptions. Patient is not currently taking opioid prescriptions. Functional ability and status Nutritional status Physical activity Advanced directives List of other physicians Hospitalizations, surgeries, and ER visits in previous 12 months Vitals Screenings to include cognitive, depression, and falls Referrals and appointments  In addition, I have reviewed and discussed with patient certain preventive protocols, quality metrics, and best practice recommendations. A written personalized care plan for preventive services as well as general preventive health recommendations were provided to patient.     Sue Lush, LPN   [id number]   After Visit Summary: (MyChart) Due to this being a telephonic visit, the after visit summary with patients personalized plan was offered to patient via MyChart   Notes: Nothing significant to report at this time.

## 2023-08-02 NOTE — Patient Instructions (Addendum)
 Debra Leach , Thank you for taking time to come for your Medicare Wellness Visit. I appreciate your ongoing commitment to your health goals. Please review the following plan we discussed and let me know if I can assist you in the future.   Referrals/Orders/Follow-Ups/Clinician Recommendations: none  This is a list of the screening recommended for you and due dates:  Health Maintenance  Topic Date Due   Yearly kidney health urinalysis for diabetes  08/16/2023   Complete foot exam   11/12/2023   Hemoglobin A1C  11/17/2023   Eye exam for diabetics  12/16/2023   DEXA scan (bone density measurement)  02/20/2024   Mammogram  03/09/2024   Yearly kidney function blood test for diabetes  04/21/2024   Medicare Annual Wellness Visit  08/01/2024   Colon Cancer Screening  07/24/2030   DTaP/Tdap/Td vaccine (4 - Td or Tdap) 04/10/2031   Pneumonia Vaccine  Completed   Flu Shot  Completed   Hepatitis C Screening  Completed   Zoster (Shingles) Vaccine  Completed   HPV Vaccine  Aged Out   COVID-19 Vaccine  Discontinued    Advanced directives: (In Chart) A copy of your advanced directives are scanned into your chart should your provider ever need it.  Next Medicare Annual Wellness Visit scheduled for next year: Yes 08/02/2024 @ 8:50am telephone visit per pt

## 2023-08-24 ENCOUNTER — Encounter: Payer: Self-pay | Admitting: Internal Medicine

## 2023-08-24 ENCOUNTER — Ambulatory Visit (INDEPENDENT_AMBULATORY_CARE_PROVIDER_SITE_OTHER): Payer: Medicare Other | Admitting: Internal Medicine

## 2023-08-24 VITALS — BP 134/82 | HR 88 | Ht 60.0 in | Wt 133.0 lb

## 2023-08-24 DIAGNOSIS — Z794 Long term (current) use of insulin: Secondary | ICD-10-CM

## 2023-08-24 DIAGNOSIS — E1165 Type 2 diabetes mellitus with hyperglycemia: Secondary | ICD-10-CM

## 2023-08-24 LAB — POCT GLYCOSYLATED HEMOGLOBIN (HGB A1C): Hemoglobin A1C: 7.6 % — AB (ref 4.0–5.6)

## 2023-08-24 MED ORDER — INSULIN PEN NEEDLE 32G X 4 MM MISC: 1.0000 | Freq: Every day | 3 refills | Status: DC

## 2023-08-24 MED ORDER — REPAGLINIDE 1 MG PO TABS: ORAL_TABLET | ORAL | 2 refills | Status: DC

## 2023-08-24 MED ORDER — LANTUS SOLOSTAR 100 UNIT/ML ~~LOC~~ SOPN: 18.0000 [IU] | PEN_INJECTOR | Freq: Every day | SUBCUTANEOUS | 4 refills | Status: DC

## 2023-08-24 NOTE — Progress Notes (Signed)
 Name: Debra Leach  MRN/ DOB: [id number], July 25, 1950   Age/ Sex: 73 y.o., female    PCP: Excell Seltzer, MD   Reason for Endocrinology Evaluation: Type 2 Diabetes Mellitus     Date of Initial Endocrinology Visit: 07/10/2021    PATIENT IDENTIFIER: Debra Leach is a 73 y.o. female with a past medical history of DM, renal stones, Autoimmune pancreatitis . The patient presented for initial endocrinology clinic visit on 07/10/2021 for consultative assistance with her diabetes management.    HPI: Debra Leach was    Diagnosed with DM 2012 Prior Medications tried/Intolerance: Metformin- GI side effects .  Currently checking blood sugars 2 x / day Hemoglobin A1c has ranged from 6.7% in 2019, peaking at 9.5% in 2022.    Has limited diet due to migraine headaches she has to eat every 3 hours otherwise her headaches will worsen  She is on long-term glucocorticoids for autoimmune pancreatitis for a long term  started  on 08/2017   On her initial visit to our clinic she had an A1c of 9.5%, she is intolerant to Jardiance due to recurrent UTIs.  We started her on repaglinide  She was also diagnosed with MNG a few years ago with last ultrasound 04/2020, thyroid nodules did not meet criteria for further follow-up.  ADRENAL HISTORY: The patient has been on budesonide for many years due to autoimmune pancreatitis.  She presented to the ED in March, 2024 with sepsis secondary to UTI and cellulitis and was noted to have a low serum cortisol which prompted cosyntropin stimulation test, and she was discharged on hydrocortisone  Her cosyntropin stimulation test  result is INVALID , due to chronic corticosteroid intake.  Patient does not need any additional glucocorticoids as she is already on budesonide  THYROID HISTORY  In reviewing her records, patient has been noted with low TSH at 0.13 u IU/mL with normal free T3 and T4 in 04/2023 .  She does have history of subcentimeter thyroid  nodules that did not meet criteria for follow-up.  Repeat TFT's normal 05/2023    SUBJECTIVE:   During the last visit (05/19/2023): A1c 9.7%    Today (08/24/23): Ms. [name] is here for follow-up on diabetes management.  She checks her glucose multiple times daily.   She continues to follow-up with GI for autoimmune pancreatitis She has chronic nausea  due to migraine which is improving  Had noted soiling recently , had  constipation yesterday    HOME ENDOCRINE  REGIMEN: Lantus 18 units daily  Repaglinide 1 mg, 1 tabs before Breakfast, 2 mg before Lunch and 2 mg before Supper     Statin: INtolerant to crestor  ACE-I/ARB: yes Prior Diabetic Education: no    CONTINUOUS GLUCOSE MONITORING RECORD INTERPRETATION    Dates of Recording: 3/6-3/19/2025  Sensor description: Dexcom  Results statistics:   CGM use % of time 95  Average and SD 182/55  Time in range 53 %  % Time Above 180 35  % Time above 250 12  % Time Below target 0   Glycemic patterns summary: BGs are optimal overnight and fluctuate during the day Hyperglycemic episodes postprandial  Hypoglycemic episodes occurred N/A  Overnight periods: optimal    DIABETIC COMPLICATIONS: Microvascular complications:  CKDIII Denies: Neuropathy, retinopathy Last eye exam: Completed   Macrovascular complications:   Denies: CAD, PVD, CVA   PAST HISTORY: Past Medical History:  Past Medical History:  Diagnosis Date   Anemia    Arthritis  osteoarthritis B knees, left hip , right elbow   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HCC)    Clotting disorder (HCC)    Complication of anesthesia    difficult waking   Diabetes mellitus without complication (HCC)    Diet and exercise controlled   Diverticulosis    DVT (deep venous thrombosis) (HCC) 02/2019   3calf 2 right lung 1 left lung   DVT (deep venous thrombosis) (HCC) 2020   right leg   Dyspnea    GERD (gastroesophageal reflux disease)    Headache    Hepatic  steatosis    Hiatal hernia    History of kidney stones    Hyperlipidemia    MVP (mitral valve prolapse)    history of   PONV (postoperative nausea and vomiting)    Pulmonary emboli (HCC) 03/01/2019   2 in the right; 1 in the left   Past Surgical History:  Past Surgical History:  Procedure Laterality Date   arm surgery  2012   left forearm fracture w/ plates pins screws   ARTERY BIOPSY Right 12/03/2021   Procedure: BIOPSY TEMPORAL ARTERY;  Surgeon: Annice Needy, MD;  Location: [facility]S;  Service: Vascular;  Laterality: Right;  Right temple   BREAST BIOPSY Left    BREAST SURGERY  2000   breast biopsy (benign)   CATARACT EXTRACTION Right 2005   CATARACT EXTRACTION Left 2010   LITHOTRIPSY  08/12/2008   stent placed bilaterally   PANCREAS BIOPSY  2021   PANCREAS BIOPSY  2019   PARTIAL HYSTERECTOMY     Both ovaries remain, vaginal, for mennorhagia   REPAIR EXTENSOR TENDON Right 06/30/2022   Procedure: Middle and ring finger sagittal band reconstruction;  Surgeon: Marlyne Beards, MD;  Location: [facility];  Service: Orthopedics;  Laterality: Right;   TENDON REPAIR Left 04/06/2023   Procedure: MIDDLE AND RING FINGER SAGITTAL BAND RECONSTRUCTION;  Surgeon: Marlyne Beards, MD;  Location: [facility];  Service: Orthopedics;  Laterality: Left;  regional120   TONSILLECTOMY     TUBAL LIGATION  1980    Social History:  reports that she quit smoking about 32 years ago. Her smoking use included cigarettes. She started smoking about 45 years ago. She has a 13 pack-year smoking history. She has never been exposed to tobacco smoke. She has never used smokeless tobacco. She reports that she does not drink alcohol and does not use drugs. Family History:  Family History  Problem Relation Age of Onset   Bone cancer Mother    Hypertension Father    Mitral valve prolapse Father    Asthma Brother    Arthritis Brother    Nephrolithiasis Brother    Asthma Brother    COPD Brother     Breast cancer Maternal Aunt    Breast cancer Maternal Aunt    Colon cancer Neg Hx    Esophageal cancer Neg Hx    Inflammatory bowel disease Neg Hx    Liver disease Neg Hx    Pancreatic cancer Neg Hx    Rectal cancer Neg Hx    Stomach cancer Neg Hx      HOME MEDICATIONS: Allergies as of 08/24/2023       Reactions   Clindamycin/lincomycin Anaphylaxis   Sudafed [pseudoephedrine Hcl] Hives, Itching, Anxiety   Atenolol Other (See Comments)   Migraine   Codeine Itching, Swelling   REACTION: Migraine   Epinephrine Itching   Hydrocodone Other (See Comments)   Migraine   Ibuprofen Other (See Comments)   Migraine  Jardiance [empagliflozin] Other (See Comments)   Caused uti Yeast infection   Metronidazole Swelling   Mouth and lips   Oxycodone Other (See Comments)   Migraine   Penicillins Itching, Swelling   ** Tolerates cephalosporins Has patient had a PCN reaction causing immediate rash, facial/tongue/throat swelling, SOB or lightheadedness with hypotension: No Has patient had a PCN reaction causing severe rash involving mucus membranes or skin necrosis: No Has patient had a PCN reaction that required hospitalization: No Has patient had a PCN reaction occurring within the last 10 years: No   Promethazine Other (See Comments)   migraine   Propranolol Other (See Comments)   Migraine   Talwin [pentazocine] Other (See Comments)   Swelling and itching    Tramadol Other (See Comments)   Migraine   Vancomycin Swelling   Swelling of Lips and Mouth   Crestor [rosuvastatin] Other (See Comments)   Muscle pain        Medication List        Accurate as of August 24, 2023 10:02 AM. If you have any questions, ask your nurse or doctor.          aluminum-magnesium hydroxide 200-200 MG/5ML suspension Take by mouth every 6 (six) hours as needed for indigestion. As needed   atorvastatin 80 MG tablet Commonly known as: LIPITOR TAKE 1 TABLET BY MOUTH DAILY   budesonide 3 MG 24  hr capsule Commonly known as: ENTOCORT EC TAKE 2 CAPSULES BY MOUTH DAILY.   colesevelam 625 MG tablet Commonly known as: WELCHOL TAKE 3 TABLETS BY MOUTH 2 TIMES DAILY WITH A MEAL.   cyanocobalamin 1000 MCG tablet Commonly known as: VITAMIN B12 Take 1,000 mcg by mouth daily with breakfast.   Dexcom G7 Sensor Misc 1 Device by Does not apply route as directed.   Eliquis 2.5 MG Tabs tablet Generic drug: apixaban TAKE 1 TABLET BY MOUTH 2 TIMES DAILY.   ferrous gluconate 324 MG tablet Commonly known as: FERGON TAKE 1 TABLET (324 MG TOTAL) BY MOUTH DAILY WITH BREAKFAST.   gabapentin 100 MG capsule Commonly known as: NEURONTIN TAKE 1 CAPSULE BY MOUTH UP TO 3 TIMES DAILY AS NEEDED.   gabapentin 300 MG capsule Commonly known as: NEURONTIN Take 2 capsules (600 mg total) by mouth at bedtime.   HYDROcodone-acetaminophen 7.5-325 MG tablet Commonly known as: NORCO Take 1 tablet every 6 hours by oral route for 7 days.   Insulin Pen Needle 32G X 4 MM Misc 1 Device by Does not apply route daily in the afternoon.   Lantus SoloStar 100 UNIT/ML Solostar Pen Generic drug: insulin glargine Inject 22 Units into the skin daily. What changed: how much to take   meclizine 25 MG tablet Commonly known as: ANTIVERT Take 1 tablet (25 mg total) by mouth 3 (three) times daily as needed for dizziness.   metroNIDAZOLE 0.75 % cream Commonly known as: METROCREAM SMARTSIG:sparingly Topical Twice Daily   omeprazole 20 MG capsule Commonly known as: PRILOSEC SMARTSIG:1 Capsule(s) By Mouth Morning-Night   ondansetron 4 MG tablet Commonly known as: Zofran Take 1 tablet (4 mg total) by mouth every 8 (eight) hours as needed for nausea or vomiting.   OneTouch Delica Plus Lancet30G Misc USE TO CHECK BLOOD SUGAR TWO TIMES DAILY   OneTouch Verio Reflect w/Device Kit TEST TWICE A DAY   OneTouch Verio test strip Generic drug: glucose blood USE TO CHECK BLOOD SUGAR TWO TIMES A DAY   potassium  chloride SA 20 MEQ tablet Commonly known as: KLOR-CON  M TAKE 1 TABLET BY MOUTH DAILY.   repaglinide 1 MG tablet Commonly known as: PRANDIN Take 1 tablet (1 mg total) by mouth daily before breakfast AND 2 tablets (2 mg total) daily before lunch AND 2 tablets (2 mg total) daily before supper.   Ubrelvy 100 MG Tabs Generic drug: Ubrogepant Take 1 tablet (100 mg total) by mouth as needed (May repeat dose after 2 hours if needed.  Maximum 2 tablets in 24 hours).   Vitamin D (Ergocalciferol) 1.25 MG (50000 UNIT) Caps capsule Commonly known as: DRISDOL TAKE 1 CAPSULE BY MOUTH ONCE A WEEK.   Vyepti 100 MG/ML injection Generic drug: Eptinezumab-jjmr Inject 300 mg into the vein every 3 (three) months.         ALLERGIES: Allergies  Allergen Reactions   Clindamycin/Lincomycin Anaphylaxis   Sudafed [Pseudoephedrine Hcl] Hives, Itching and Anxiety   Atenolol Other (See Comments)    Migraine   Codeine Itching and Swelling    REACTION: Migraine   Epinephrine Itching   Hydrocodone Other (See Comments)    Migraine   Ibuprofen Other (See Comments)    Migraine   Jardiance [Empagliflozin] Other (See Comments)    Caused uti Yeast infection   Metronidazole Swelling    Mouth and lips   Oxycodone Other (See Comments)    Migraine   Penicillins Itching and Swelling    ** Tolerates cephalosporins Has patient had a PCN reaction causing immediate rash, facial/tongue/throat swelling, SOB or lightheadedness with hypotension: No Has patient had a PCN reaction causing severe rash involving mucus membranes or skin necrosis: No Has patient had a PCN reaction that required hospitalization: No Has patient had a PCN reaction occurring within the last 10 years: No     Promethazine Other (See Comments)    migraine   Propranolol Other (See Comments)    Migraine   Talwin [Pentazocine] Other (See Comments)    Swelling and itching    Tramadol Other (See Comments)    Migraine   Vancomycin Swelling     Swelling of Lips and Mouth   Crestor [Rosuvastatin] Other (See Comments)    Muscle pain     REVIEW OF SYSTEMS: A comprehensive ROS was conducted with the patient and is negative except as per HPI    OBJECTIVE:   VITAL SIGNS: BP 134/82 (BP Location: Left Arm, Patient Position: Sitting, Cuff Size: Small)   Pulse 88   Ht 5' (1.524 m)   Wt 133 lb (60.3 kg)   SpO2 97%   BMI 25.97 kg/m    PHYSICAL EXAM:  General: Pt appears well and is in NAD  Neck: General: Supple without adenopathy or carotid bruits. Thyroid: Thyroid size normal.  No goiter or nodules appreciated.   Lungs: Clear with good BS bilat   Heart: RRR   Extremities:  Lower extremities - tace pretibial edema.  Neuro: MS is good with appropriate affect, pt is alert and Ox3    DM foot exam: 08/24/2023  The skin of the feet is intact without sores or ulcerations. The pedal pulses are 2+ on right and 2+ on left. The sensation is intact to a screening 5.07, 10 gram monofilament bilaterally   DATA REVIEWED:  Lab Results  Component Value Date   HGBA1C 7.6 (A) 08/24/2023   HGBA1C 9.7 (A) 05/19/2023   HGBA1C 7.4 (A) 11/12/2022    Latest Reference Range & Units 04/22/23 14:39  Sodium 135 - 146 mmol/L 139  Potassium 3.5 - 5.3 mmol/L 4.2  Chloride 98 -  110 mmol/L 103  CO2 20 - 32 mmol/L 23  Glucose 65 - 99 mg/dL 409 (H)  BUN 7 - 25 mg/dL 22  Creatinine 8.11 - 9.14 mg/dL 7.82  Calcium 8.6 - 95.6 mg/dL 9.7  BUN/Creatinine Ratio 6 - 22 (calc) SEE NOTE:  Magnesium 1.5 - 2.5 mg/dL 2.0  AG Ratio 1.0 - 2.5 (calc) 1.3  AST 10 - 35 U/L 13  ALT 6 - 29 U/L 19  Total Protein 6.1 - 8.1 g/dL 7.2  Total Bilirubin 0.2 - 1.2 mg/dL 0.4    Latest Reference Range & Units 05/19/23 09:39  TSH 0.40 - 4.50 mIU/L 0.74  Triiodothyronine,Free,Serum 2.3 - 4.2 pg/mL 2.8  T4,Free(Direct) 0.8 - 1.8 ng/dL 1.3    Thyroid Ultrasound 04/26/2023    Narrative & Impression  CLINICAL DATA:  Goiter.  Hoarseness   EXAM: THYROID  ULTRASOUND   TECHNIQUE: Ultrasound examination of the thyroid gland and adjacent soft tissues was performed.   COMPARISON:  Prior thyroid ultrasound 04/17/2020   FINDINGS: Parenchymal Echotexture: Mildly heterogenous   Isthmus: 0.3 cm   Right lobe: 4.7 x 1.0 x 1.4 cm   Left lobe: 4.2 x 1.2 x 1.3 cm   _________________________________________________________   Estimated total number of nodules >/= 1 cm: 3   Number of spongiform nodules >/=  2 cm not described below (TR1): 0   Number of mixed cystic and solid nodules >/= 1.5 cm not described below (TR2): 0   _________________________________________________________   Small heterogeneous thyroid gland. Innumerable small cysts and nodules again noted throughout the gland.   No significant interval change in the size or appearance of any individual nodule dating back to 2021. as before, no individual nodule meets criteria to warrant biopsy or dedicated imaging surveillance.   IMPRESSION: No significant interval change in the size or appearance of the small, heterogeneous and multinodular thyroid gland.     ASSESSMENT / PLAN / RECOMMENDATIONS:   1) Type 2 Diabetes Mellitus, Sub-optimally  Controlled, With fluctuating GFR  - Most recent A1c of 7.6%. Goal A1c < 7.0 %.    -A1c has trended down from 9.7% to 7.6% -Patient is not able to follow a low-carb diet due to autoimmune pancreatitis,chronic GI symptoms, and migraine headaches which limits her dietary options.   -Our goal is to prevent hypoglycemia and severe hyperglycemia - Pt with hx of pancreatitis hence GLP-1 agonist and DPP 4 inhibitors as well as Tirzepatide are all contraindicated - Intolerant to Jardiance due to recurrent genital infections  -No changes at this time  MEDICATIONS: Continue Lantus 18 units daily Continue Repaglinidie 1 mg, 1 mg before breakfast, 2 mg Before Lunch , 2 mg before supper  EDUCATION / INSTRUCTIONS: BG monitoring instructions:  Patient is instructed to check her blood sugars 1 times a day, fasting. Call [facility] if: BG persistently < 70  I reviewed the Rule of 15 for the treatment of hypoglycemia in detail with the patient. Literature supplied.   2) Diabetic complications:  Eye: Does not have known diabetic retinopathy.  Neuro/ Feet: Does not have known diabetic peripheral neuropathy. Renal: Patient does  have known baseline CKD. She is  on an ACEI/ARB at present.      Follow-up in 6 months      Signed electronically by: Lyndle Herrlich, MD  [facility]  [facility] [address] [place] [place], [place] [address] Phone: [phone] FAX: [phone]   CC: Excell Seltzer, MD [address]  [address] [place] [address] Phone: [phone]  Fax: [phone]    Return to Endocrinology clinic as below: Future Appointments  Date Time Provider Department Center  08/24/2023 10:10 AM Miliana Gangwer, Konrad Dolores, MD [facility] None  08/26/2023  8:00 AM [facility] LAB [facility]  08/30/2023  9:10 AM Drema Dallas, DO [facility] None  09/02/2023 10:20 AM Excell Seltzer, MD [facility]  08/02/2024  8:50 AM [facility] ANNUAL WELLNESS VISIT 1 [facility]

## 2023-08-24 NOTE — Patient Instructions (Signed)
-   Continue Lantus 18 units daily  - Continue  Repaglinide 1 mg  before Breakfast,  2 mg  before Lunch  and 2 mg before Supper     HOW TO TREAT LOW BLOOD SUGARS (Blood sugar LESS THAN 70 MG/DL) Please follow the RULE OF 15 for the treatment of hypoglycemia treatment (when your (blood sugars are less than 70 mg/dL)   STEP 1: Take 15 grams of carbohydrates when your blood sugar is low, which includes:  3-4 GLUCOSE TABS  OR 3-4 OZ OF JUICE OR REGULAR SODA OR ONE TUBE OF GLUCOSE GEL    STEP 2: RECHECK blood sugar in 15 MINUTES STEP 3: If your blood sugar is still low at the 15 minute recheck --> then, go back to STEP 1 and treat AGAIN with another 15 grams of carbohydrates.

## 2023-08-26 ENCOUNTER — Other Ambulatory Visit (INDEPENDENT_AMBULATORY_CARE_PROVIDER_SITE_OTHER): Payer: Medicare Other

## 2023-08-26 DIAGNOSIS — E1169 Type 2 diabetes mellitus with other specified complication: Secondary | ICD-10-CM

## 2023-08-26 DIAGNOSIS — E785 Hyperlipidemia, unspecified: Secondary | ICD-10-CM

## 2023-08-26 DIAGNOSIS — N1831 Chronic kidney disease, stage 3a: Secondary | ICD-10-CM | POA: Diagnosis not present

## 2023-08-26 DIAGNOSIS — Z794 Long term (current) use of insulin: Secondary | ICD-10-CM

## 2023-08-26 DIAGNOSIS — E1122 Type 2 diabetes mellitus with diabetic chronic kidney disease: Secondary | ICD-10-CM

## 2023-08-26 LAB — LIPID PANEL
Cholesterol: 221 mg/dL — ABNORMAL HIGH (ref 0–200)
HDL: 57 mg/dL (ref >39.00)
LDL Cholesterol: 131 mg/dL — ABNORMAL HIGH (ref 0–99)
NonHDL: 163.75
Total CHOL/HDL Ratio: 4
Triglycerides: 164 mg/dL — ABNORMAL HIGH (ref 0.0–149.0)
VLDL: 32.8 mg/dL (ref 0.0–40.0)

## 2023-08-26 LAB — COMPREHENSIVE METABOLIC PANEL
ALT: 11 U/L (ref 0–35)
AST: 13 U/L (ref 0–37)
Albumin: 3.8 g/dL (ref 3.5–5.2)
Alkaline Phosphatase: 63 U/L (ref 39–117)
BUN: 16 mg/dL (ref 6–23)
CO2: 27 meq/L (ref 19–32)
Calcium: 9.2 mg/dL (ref 8.4–10.5)
Chloride: 109 meq/L (ref 96–112)
Creatinine, Ser: 0.88 mg/dL (ref 0.40–1.20)
GFR: 65.71 mL/min (ref >60.00)
Glucose, Bld: 92 mg/dL (ref 70–99)
Potassium: 3.6 meq/L (ref 3.5–5.1)
Sodium: 144 meq/L (ref 135–145)
Total Bilirubin: 0.5 mg/dL (ref 0.2–1.2)
Total Protein: 6.9 g/dL (ref 6.0–8.3)

## 2023-08-26 LAB — HEMOGLOBIN A1C: Hgb A1c MFr Bld: 8.2 % — ABNORMAL HIGH (ref 4.6–6.5)

## 2023-08-29 LAB — MICROALBUMIN / CREATININE URINE RATIO
Creatinine,U: 30.1 mg/dL
Microalb Creat Ratio: UNDETERMINED mg/g (ref 0.0–30.0)
Microalb, Ur: 0 mg/dL (ref 0.0–1.9)

## 2023-08-29 NOTE — Progress Notes (Unsigned)
 In   NEUROLOGY FOLLOW UP OFFICE NOTE  Debra Leach [id number]  Assessment/Plan:   Episodic vertigo, possibly migrainous  Migraine without aura, without status migrainosus, not intractable - increased frequency  Ocular migraine     Migraine prevention:  Vyepti 300mg  Q12wks.   gabapentin 600mg  at bedtime.  No change in preventative for now.  Will monitor over the next 3 months. Migraine rescue:  Ubrelvy 100mg ; gabapentin 100mg  for less severe.  To see if more effective than Ubrelvy, will have her try samples of Nurtec. Limit use of pain relievers to no more than 2 days out of week to prevent risk of rebound or medication-overuse headache. Keep headache diary Follow up 3 months.     Subjective:  Debra Leach is a 73 year old right-handed female with autoimmune pancreatitis, migraines, hyperlipidemia, diabetes, GERD, osteoarthritis, and history of MVP and renal stones who follows up for dizziness.     UPDATE: She has had an increase in migraines.  Over the course of the last 82 days, she had only 12 migraine-free days (15%) and 68 days with migraines (83%).  She does report some increased stress.  Her sister-in-law with Parkinson's disease was ill and passed away, requiring several visits back and forth to [place].  Rescue protocol:  For moderate - gabapentin 100mg , for severeBernita Leach Current NSAIDS/steroid: budesomide Current analgesics: None Current triptans: None Current ergotamine: None Current anti-emetic: Promethazine 12.5 mg Current muscle relaxants: None Current anti-anxiolytic: None Current sleep aide: None Current Antihypertensive medications: losartan-HCTZ Current Antidepressant medications: None Current Anticonvulsant medications: Gabapentin 600mg  at bedtime and 100mg  TID PRN Current anti-CGRP: Vyepti 300mg ; Ubrelvy Current Vitamins/Herbal/Supplements: Riboflavin 400 mg daily, vitamin D Current Antihistamines/Decongestants: meclizine Other therapy:  Cefaly, ice pack Other medication: Meclizine   Caffeine: 1 cup of coffee daily Alcohol: None Smoker: No Diet: Follows LEAP ImmunoCalm Dietary Program.  Hydrates. Exercise: When tolerated (knee problems).  She walks and bikes. Depression: No; Anxiety: No Other pain:  no Sleep hygiene: Improved.  Sleeps better now than every before. 100mg  during day as needed   HISTORY: Migraines: Onset: 73 years old Location:  Varies (unilateral either side, frontal-temporal, back of head) Quality:  Varies (stabbing, pounding, throbbing) Initial Intensity:  10/10 severe, otherwise 5-7/10 Aura:  Occurs off and on over the years and varies in semiology.  In early 1970s, black out.  In late 1980s, white out.  In late 1990s, flashing lights and zigzag lines.  Since 2016, scintillating scotoma (occurs 1 to 2 times a month) Prodrome:  no Associated symptoms: Initially vomiting.  Sometimes nausea.  Photophobia, phonophobia.  Dizziness, speech difficulty. Initial Duration:  All day Initial Frequency:  daily Triggers: Change in weather, emotional stress, valsalva maneuver, odors, certain foods Relieving factors: ice, rest Activity:  Difficult to function if severe  She has been to the ED for migraines with speech disturbance.  Workup in March 2024 included MRI of brain without contrast and CTA head and neck which were unremarkable.     Past NSAIDS:  diclofenac 50mg , Cambia, Mobic 15mg , ibuprofen, naproxen, toradol Past analgesics:  tramadol (reaction), Midrin, Excedrin Past abortive triptans/ergots:  Treximet, Axert, Amerge, Frova, Maxalt, Relpax, Zomig tablet, DHE NS, sumatriptan 100mg ; sumatriptan 6mg  [name] Past anti-emetic:  Zofran 4mg  Past anxiolytic:  Buspirone, clonazepam Past antihypertensive medications:  Metoprolol, Norvasc, propranolol ER 120mg , atenolol 100mg  (side effects dizziness, myalgias, acid reflux) Past antidepressant medications:  Venlafaxine, sertraline, amitriptyline, amoxapine,  duloxetine, nortriptyline, imipramine, Luvox, Remeron, Serzone, bupropion, desipramine, doxepin, Vivactil Past anticonvulsant medications:  Depakote, Topamax,  zonisamide, Lamictal, Keppra, Lyrica, gabapentin 300/300/600  Past CGRP inhibitor: Aimovig, Emgality Past vitamins/Herbal/Supplements:  butterbur, Mg, CoQ10 Other past treatments:  Botox, Cefaly Device, methylergonovine, acupuncture, biofeedback, Seroquel, Thorazine, methylsergide maleate   Family history of headache:  Maybe her mother's side.   Prior brain MRI normal (date unknown but report is mentioned in prior notes).   DIzziness: Began having dizziness in early 2022.  No preceding head injury or new medication.  Reports episodic dizziness described as a wave of lightheadedness or sensation of stepping off of a boat, but not spinning.  It occurs with any movement but also can occur spontaneously.  Typically lasts 15 seconds but occurs all day long.  It doesn't occur while laying in bed, such as turning over.  No headache but feels like her head is floating.  No visual disturbance.  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  It does not appear to correlate with a migraine flare.  She reports history of mild orthostatic hypotension when getting up too fast, but not experiencing that with this and reportedly orthostatic vitals were negative when checked by her PCP.  Flonase didn't help, however meclizine is effective.  She followed up with ENT.  Dix-Hallpike was negative.  VNG showed abnormal smooth pursuit, saccades and optokinetic testing - central pathology cannot be ruled out.  She has bilateral sensorineural hearing loss.  Hearing aids were recommended.  Underwent workup for possible central dizziness.  MRI and MRA of brain on 12/23/2020 were unremarkable.  Vestibular rehab effective.   Abnormal Movements: In December 2017, we started atenolol for  migraine prevention.  It worked well, but due to reported side effects of dizziness, acid reflux and joint and muscle pain, she was tapered off of it in early April.  Around that time, she developed shaking episodes or tremors associated with her migraines.  She followed up with her PCP, Dr. Ermalene Searing, on 10/08/16 and we had her discontinue the gabapentin.  She hasn't had any recurrent tremors, however she says she has gone up to 4 days at a time without an attack.  Since discontinuing the gabapentin, the intensity of her daily headaches have gotten worse.     I reviewed the video of her habitual attacks.  In the video, she exhibits flapping of her right hand, arrhythmic and without tremor or choreiform movement.  It is not an arrhythmic jerking, consistent with myoclonus.  It sometimes involves the left hand as well.  She denies stress and anxiety.  Episodic Right Monocular Vision Loss: In April 2023, she began having episodes of monocular dimming of vision in the right eye lasting 15 minutes.  No associated headache.  Different than her previous ocular migraines presenting as pixel vision.  Saw her ophthalmologist and eye exam was normal.  Sed rate on 5/12 was 102 and repeat on 5/17 was 130.  She was hospitalized on 11/02/2021 for syncope in setting of AKI and flu.  She was placed on 5 day course of prednsione.  Sed rate on day of admission was 60.  Sed rate in May 2023 was over 130.  Carotid ultrasound on 6/15 was negative for hemodynamically significant stenosis.  Underwent temporal artery biopsy on 6/29 which was negative.  Repeat eye exam on 12/09/2021 was normal.  Possible ocular migraine?  PAST MEDICAL HISTORY: Past Medical History:  Diagnosis Date   Anemia    Arthritis    osteoarthritis B knees,  left hip , right elbow   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HCC)    Clotting disorder (HCC)    Complication of anesthesia    difficult waking   Diabetes mellitus without complication (HCC)    Diet and exercise  controlled   Diverticulosis    DVT (deep venous thrombosis) (HCC) 02/2019   3calf 2 right lung 1 left lung   DVT (deep venous thrombosis) (HCC) 2020   right leg   Dyspnea    GERD (gastroesophageal reflux disease)    Headache    Hepatic steatosis    Hiatal hernia    History of kidney stones    Hyperlipidemia    MVP (mitral valve prolapse)    history of   PONV (postoperative nausea and vomiting)    Pulmonary emboli (HCC) 03/01/2019   2 in the right; 1 in the left    MEDICATIONS: Current Outpatient Medications on File Prior to Visit  Medication Sig Dispense Refill   aluminum-magnesium hydroxide 200-200 MG/5ML suspension Take by mouth every 6 (six) hours as needed for indigestion. As needed     apixaban (ELIQUIS) 2.5 MG TABS tablet TAKE 1 TABLET BY MOUTH 2 TIMES DAILY. 180 tablet 0   atorvastatin (LIPITOR) 80 MG tablet TAKE 1 TABLET BY MOUTH DAILY 90 tablet 3   Blood Glucose Monitoring Suppl (ONETOUCH VERIO REFLECT) w/Device KIT TEST TWICE A DAY 1 kit 0   budesonide (ENTOCORT EC) 3 MG 24 hr capsule TAKE 2 CAPSULES BY MOUTH DAILY. 180 capsule 12   colesevelam (WELCHOL) 625 MG tablet TAKE 3 TABLETS BY MOUTH 2 TIMES DAILY WITH A MEAL. 540 tablet 3   Continuous Glucose Sensor (DEXCOM G7 SENSOR) MISC 1 Device by Does not apply route as directed. 9 each 3   Eptinezumab-jjmr (VYEPTI) 100 MG/ML injection Inject 300 mg into the vein every 3 (three) months.     ferrous gluconate (FERGON) 324 MG tablet TAKE 1 TABLET (324 MG TOTAL) BY MOUTH DAILY WITH BREAKFAST. 90 tablet 1   gabapentin (NEURONTIN) 100 MG capsule TAKE 1 CAPSULE BY MOUTH UP TO 3 TIMES DAILY AS NEEDED. 270 capsule 3   gabapentin (NEURONTIN) 300 MG capsule Take 2 capsules (600 mg total) by mouth at bedtime. 180 capsule 1   HYDROcodone-acetaminophen (NORCO) 7.5-325 MG tablet Take 1 tablet every 6 hours by oral route for 7 days.     insulin glargine (LANTUS SOLOSTAR) 100 UNIT/ML Solostar Pen Inject 18 Units into the skin daily. 15 mL 4    Insulin Pen Needle 32G X 4 MM MISC 1 Device by Does not apply route daily in the afternoon. 100 each 3   Lancets (ONETOUCH DELICA PLUS LANCET30G) MISC USE TO CHECK BLOOD SUGAR TWO TIMES DAILY 200 each 3   meclizine (ANTIVERT) 25 MG tablet Take 1 tablet (25 mg total) by mouth 3 (three) times daily as needed for dizziness. 30 tablet 1   metroNIDAZOLE (METROCREAM) 0.75 % cream SMARTSIG:sparingly Topical Twice Daily     omeprazole (PRILOSEC) 20 MG capsule SMARTSIG:1 Capsule(s) By Mouth Morning-Night     ondansetron (ZOFRAN) 4 MG tablet Take 1 tablet (4 mg total) by mouth every 8 (eight) hours as needed for nausea or vomiting. 20 tablet 6   ONETOUCH VERIO test strip USE TO CHECK BLOOD SUGAR TWO TIMES A DAY 200 strip 3   potassium chloride SA (KLOR-CON M) 20 MEQ tablet TAKE 1 TABLET BY MOUTH DAILY. 90 tablet 3   repaglinide (PRANDIN) 1 MG tablet Take 1 tablet (1 mg total) by mouth  daily before breakfast AND 2 tablets (2 mg total) daily before lunch AND 2 tablets (2 mg total) daily before supper. 450 tablet 2   Ubrogepant (UBRELVY) 100 MG TABS Take 1 tablet (100 mg total) by mouth as needed (May repeat dose after 2 hours if needed.  Maximum 2 tablets in 24 hours). 10 tablet 3   vitamin B-12 (CYANOCOBALAMIN) 1000 MCG tablet Take 1,000 mcg by mouth daily with breakfast.     Vitamin D, Ergocalciferol, (DRISDOL) 1.25 MG (50000 UNIT) CAPS capsule TAKE 1 CAPSULE BY MOUTH ONCE A WEEK. 12 capsule 3   No current facility-administered medications on file prior to visit.    ALLERGIES: Allergies  Allergen Reactions   Clindamycin/Lincomycin Anaphylaxis   Sudafed [Pseudoephedrine Hcl] Hives, Itching and Anxiety   Atenolol Other (See Comments)    Migraine   Codeine Itching and Swelling    REACTION: Migraine   Epinephrine Itching   Hydrocodone Other (See Comments)    Migraine   Ibuprofen Other (See Comments)    Migraine   Jardiance [Empagliflozin] Other (See Comments)    Caused uti Yeast infection    Metronidazole Swelling    Mouth and lips   Oxycodone Other (See Comments)    Migraine   Penicillins Itching and Swelling    ** Tolerates cephalosporins Has patient had a PCN reaction causing immediate rash, facial/tongue/throat swelling, SOB or lightheadedness with hypotension: No Has patient had a PCN reaction causing severe rash involving mucus membranes or skin necrosis: No Has patient had a PCN reaction that required hospitalization: No Has patient had a PCN reaction occurring within the last 10 years: No     Promethazine Other (See Comments)    migraine   Propranolol Other (See Comments)    Migraine   Talwin [Pentazocine] Other (See Comments)    Swelling and itching    Tramadol Other (See Comments)    Migraine   Vancomycin Swelling    Swelling of Lips and Mouth   Crestor [Rosuvastatin] Other (See Comments)    Muscle pain    FAMILY HISTORY: Family History  Problem Relation Age of Onset   Bone cancer Mother    Hypertension Father    Mitral valve prolapse Father    Asthma Brother    Arthritis Brother    Nephrolithiasis Brother    Asthma Brother    COPD Brother    Breast cancer Maternal Aunt    Breast cancer Maternal Aunt    Colon cancer Neg Hx    Esophageal cancer Neg Hx    Inflammatory bowel disease Neg Hx    Liver disease Neg Hx    Pancreatic cancer Neg Hx    Rectal cancer Neg Hx    Stomach cancer Neg Hx       Objective:  Blood pressure (!) 142/74, pulse 73, resp. rate 18, height 5\' 1"  (1.549 m), weight 132 lb (59.9 kg), SpO2 98%. General: No acute distress.  Patient appears well-groomed.         Debra Millet, DO  CC: Kerby Nora, MD

## 2023-08-30 ENCOUNTER — Encounter: Payer: Self-pay | Admitting: Family Medicine

## 2023-08-30 ENCOUNTER — Telehealth: Payer: Self-pay | Admitting: Neurology

## 2023-08-30 ENCOUNTER — Encounter: Payer: Self-pay | Admitting: Neurology

## 2023-08-30 ENCOUNTER — Ambulatory Visit (INDEPENDENT_AMBULATORY_CARE_PROVIDER_SITE_OTHER): Payer: Medicare Other | Admitting: Neurology

## 2023-08-30 VITALS — BP 142/74 | HR 73 | Resp 18 | Ht 61.0 in | Wt 132.0 lb

## 2023-08-30 DIAGNOSIS — G43109 Migraine with aura, not intractable, without status migrainosus: Secondary | ICD-10-CM | POA: Diagnosis not present

## 2023-08-30 DIAGNOSIS — G43009 Migraine without aura, not intractable, without status migrainosus: Secondary | ICD-10-CM

## 2023-08-30 NOTE — Patient Instructions (Signed)
 Take Nurtec once daily as needed for acute migraine

## 2023-08-30 NOTE — Progress Notes (Signed)
 No critical labs need to be addressed urgently. We will discuss labs in detail at upcoming office visit.

## 2023-08-30 NOTE — Telephone Encounter (Signed)
 Patient called to let Jaffe's nurse know she went home and took her BP. It was 142/74 and she waited 40 minutes and took it again, it was 146/82. She did have a bad migraine all night and she woke up and took medication this morning.

## 2023-09-02 ENCOUNTER — Encounter: Payer: Self-pay | Admitting: Family Medicine

## 2023-09-02 ENCOUNTER — Telehealth: Payer: Self-pay | Admitting: Gastroenterology

## 2023-09-02 ENCOUNTER — Ambulatory Visit: Payer: Medicare Other | Admitting: Family Medicine

## 2023-09-02 ENCOUNTER — Telehealth: Payer: Self-pay

## 2023-09-02 VITALS — BP 152/80 | HR 78 | Temp 98.7°F | Ht 59.65 in | Wt 131.4 lb

## 2023-09-02 DIAGNOSIS — E559 Vitamin D deficiency, unspecified: Secondary | ICD-10-CM

## 2023-09-02 DIAGNOSIS — E1169 Type 2 diabetes mellitus with other specified complication: Secondary | ICD-10-CM | POA: Diagnosis not present

## 2023-09-02 DIAGNOSIS — Z0000 Encounter for general adult medical examination without abnormal findings: Secondary | ICD-10-CM

## 2023-09-02 DIAGNOSIS — Z794 Long term (current) use of insulin: Secondary | ICD-10-CM

## 2023-09-02 DIAGNOSIS — D84821 Immunodeficiency due to drugs: Secondary | ICD-10-CM

## 2023-09-02 DIAGNOSIS — Z86718 Personal history of other venous thrombosis and embolism: Secondary | ICD-10-CM

## 2023-09-02 DIAGNOSIS — R490 Dysphonia: Secondary | ICD-10-CM

## 2023-09-02 DIAGNOSIS — E1159 Type 2 diabetes mellitus with other circulatory complications: Secondary | ICD-10-CM | POA: Diagnosis not present

## 2023-09-02 DIAGNOSIS — E042 Nontoxic multinodular goiter: Secondary | ICD-10-CM

## 2023-09-02 DIAGNOSIS — E785 Hyperlipidemia, unspecified: Secondary | ICD-10-CM

## 2023-09-02 DIAGNOSIS — E1122 Type 2 diabetes mellitus with diabetic chronic kidney disease: Secondary | ICD-10-CM

## 2023-09-02 DIAGNOSIS — M8580 Other specified disorders of bone density and structure, unspecified site: Secondary | ICD-10-CM

## 2023-09-02 DIAGNOSIS — Z7952 Long term (current) use of systemic steroids: Secondary | ICD-10-CM

## 2023-09-02 DIAGNOSIS — N1831 Chronic kidney disease, stage 3a: Secondary | ICD-10-CM

## 2023-09-02 DIAGNOSIS — I700 Atherosclerosis of aorta: Secondary | ICD-10-CM

## 2023-09-02 DIAGNOSIS — K861 Other chronic pancreatitis: Secondary | ICD-10-CM

## 2023-09-02 DIAGNOSIS — D8984 IgG4-related disease: Secondary | ICD-10-CM

## 2023-09-02 DIAGNOSIS — D473 Essential (hemorrhagic) thrombocythemia: Secondary | ICD-10-CM

## 2023-09-02 DIAGNOSIS — I152 Hypertension secondary to endocrine disorders: Secondary | ICD-10-CM

## 2023-09-02 MED ORDER — PANTOPRAZOLE SODIUM 40 MG PO TBEC: 40.0000 mg | DELAYED_RELEASE_TABLET | Freq: Every day | ORAL | 3 refills | Status: DC

## 2023-09-02 MED ORDER — COLESEVELAM HCL 3.75 G PO PACK: 3.7500 g | PACK | Freq: Every day | ORAL | 11 refills | Status: DC

## 2023-09-02 NOTE — Assessment & Plan Note (Signed)
 Followed by endocrine

## 2023-09-02 NOTE — Assessment & Plan Note (Signed)
 On chronic Eliquis for DVT/PE history

## 2023-09-02 NOTE — Assessment & Plan Note (Signed)
 Omeprazole 40 did not help... will try pantoprazole 40 mg daily... and Pepcid AC twice daily

## 2023-09-02 NOTE — Assessment & Plan Note (Signed)
 On supplement

## 2023-09-02 NOTE — Assessment & Plan Note (Signed)
Chronic, inadequate control but on max statin and WelChol.  Working on lifestyle change.

## 2023-09-02 NOTE — Telephone Encounter (Signed)
 OK for repeat EGD with dilation to be scheduled next available. If she still has issues after, then will need Manometry.  Corliss Parish, MD [facility] Office # [phone]

## 2023-09-02 NOTE — Assessment & Plan Note (Signed)
 Per GI

## 2023-09-02 NOTE — Assessment & Plan Note (Signed)
Chronic Followed by Dr. Meridee Score. Chronic budesonide. She is feeling better altogether.

## 2023-09-02 NOTE — Telephone Encounter (Signed)
 [facility] Pre-operative Risk Assessment     Request for surgical clearance:     Endoscopy Procedure  What type of surgery is being performed?     EGD with dilation   When is this surgery scheduled?     10/12/2023   What type of clearance is required ?   Pharmacy  Are there any medications that need to be held prior to surgery and how long? Eliquis /2 days  Practice name and name of physician performing surgery?      [facility]/ Dr. Meridee Score  What is your office phone and fax number?      Phone- [phone]  Fax- [phone]  Anesthesia type (None, local, MAC, general) ?       MAC   Please route your response to Emeline Darling RN

## 2023-09-02 NOTE — Assessment & Plan Note (Signed)
 Chronic,  improved.Marland Kitchen GFR 65

## 2023-09-02 NOTE — Assessment & Plan Note (Signed)
On high dose statin.

## 2023-09-02 NOTE — Assessment & Plan Note (Signed)
 On chronic budesonide

## 2023-09-02 NOTE — Telephone Encounter (Signed)
 Pt stated that she has been having difficulty swallowing that has increased and worsened  over the last several weeks.  Pt last EGD with Dilation was 02/07/2023. Pt requesting to be scheduled for another EGD with dilation.  Please review and advise.

## 2023-09-02 NOTE — Telephone Encounter (Signed)
 Pt made aware of Dr. Meridee Score recommendations: Pt was scheduled for a previsit on 09/12/2023 at 2:00 PM. Pt made aware,  Pt was scheduled for an EGD with dilation on 10/12/2023 at 7:30 AM with Dr. Meridee Score. Pt made aware.  Pt stated that she is on Eliquis. Prescribed by her PCP Dr.  Kerby Nora.  Phone note was sent to Dr. Ermalene Searing requesting Clearance. Pt made aware.  Pt verbalized understanding with all questions answered.

## 2023-09-02 NOTE — Assessment & Plan Note (Addendum)
 Chronic, border line.. Not or ARB any longer.   Will follow.. consider increasing losartan back 25 mg daily

## 2023-09-02 NOTE — Assessment & Plan Note (Signed)
 History , 02/2023 cbc  normal platelets.

## 2023-09-02 NOTE — Patient Instructions (Addendum)
 Let me know if exercsie does not help with migraine and BP lowering.. consider restarting losartan 25 mg.  Goal < 130/80

## 2023-09-02 NOTE — Telephone Encounter (Signed)
 Patient called states she is having issues with choking again for a few weeks now. Would like to know if she can do another EGD sooner then recommended.

## 2023-09-02 NOTE — Assessment & Plan Note (Signed)
Chronic, significant improvement with assistance of endocrinology on current regimen.  As she has been working on healthy eating and has lost a significant amount of weight. She has been able to decrease insulin and continues on repaglinide.

## 2023-09-02 NOTE — Progress Notes (Signed)
 Patient ID: Debra Leach, female    DOB: [birth year], 73 y.o.   MRN: [id number]  This visit was conducted in person.  BP (!) 140/70 (BP Location: Right Arm, Patient Position: Sitting, Cuff Size: Normal)   Pulse 78   Temp 98.7 F (37.1 C) (Temporal)   Ht 4' 11.65" (1.515 m)   Wt 131 lb 6 oz (59.6 kg)   SpO2 98%   BMI 25.96 kg/m    CC:  Chief Complaint  Patient presents with   Annual Exam    Part 2 (MWV 08/02/2023)    Subjective:   HPI: Debra Leach is a 73 y.o. female presenting on 09/02/2023 for Annual Exam (Part 2 (MWV 08/02/2023))  The patient presents for  complete physical and review of chronic health problems. He/She also has the following acute concerns today:  continued dysphagia.Marland Kitchen despite dilation in 02/2023 for Schatzki risng  Has appt to see GI in 11/2023.    Cannot swallow large welchol.   The patient saw a LPN or RN for medicare wellness visit. 08/02/23  Prevention and wellness was reviewed in detail. Note reviewed and important notes copied below.  Wt Readings from Last 3 Encounters:  09/02/23 131 lb 6 oz (59.6 kg)  08/30/23 132 lb (59.9 kg)  08/24/23 133 lb (60.3 kg)    Diabetes:Poor control but improving.  Wiill have to be on life long steroid...  followed by  ENDO [name], now on  Prandin ( vaginal irritation/UTI with jardiance).  Lantus Insulin 18 Units daily Lab Results  Component Value Date   HGBA1C 8.2 (H) 08/26/2023  Using medications without difficulties: Hypoglycemic episodes: frequent lows/alarms Hyperglycemic episodes:   none Feet problems: no ulcers Blood Sugars averaging: 92- 95 Dexcom eye exam within last year: yes  Hypertension Associated with DM: Well controlled on   no medication.. has migraine today.... has only had 12 migraine free days in 2025. BP Readings from Last 3 Encounters:  09/02/23 (!) 140/70  08/30/23 (!) 142/74  08/24/23 134/82  Using medication without problems or lightheadedness:   none Chest pain with exertion: none Edema has stated 3 times a week stationary bike Short of breath: none Average home BPs: At home   132-134/78-79 Other issues:  Elevated Cholesterol: LDL not at goal  < 70 (  atherosclerosis of aorta) despite lipitor 80 mg daily and welchol 625 mg 3 tabs BID Lab Results  Component Value Date   CHOL 221 (H) 08/26/2023   HDL 57.00 08/26/2023   LDLCALC 131 (H) 08/26/2023   LDLDIRECT 164.0 12/29/2022   TRIG 164.0 (H) 08/26/2023   CHOLHDL 4 08/26/2023  Using medications without problems: Muscle aches:  Diet compliance: Exercise:  stationary bike ( every other day/10-30 min) given limited  with DOE after PE. Other complaints:   Vit D:  1000 IU daily. Not well controlled.. will change to 50,000 Units weekly x 12 months.   Severe migraine: followed by neurology. This limits her diet. Recent OV with Dr. Adriana Mccallum last week, reviewed note.  Given Nurtec.. has  not tried yet.  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 Next week.  Patient Care Team: Excell Seltzer, MD as PCP - General (Family Medicine) Drema Dallas, DO as Consulting Physician (Neurology) Vilinda Flake, [facility] (Inactive) as Pharmacist (Pharmacist) [facility], Konrad Dolores, MD as Attending Physician (Endocrinology) Domingo Madeira, OD (Optometry)  [facility]  [facility]    Relevant past medical, surgical, family and social history reviewed and updated as indicated. Interim medical history since our last visit reviewed. Allergies and medications reviewed and updated. Outpatient Medications Prior to Visit  Medication Sig Dispense Refill   aluminum-magnesium hydroxide 200-200 MG/5ML suspension Take by mouth every 6 (six) hours as needed for indigestion. As needed     apixaban (ELIQUIS) 2.5 MG TABS tablet TAKE 1 TABLET BY MOUTH 2 TIMES DAILY. 180 tablet 0   atorvastatin (LIPITOR)  80 MG tablet TAKE 1 TABLET BY MOUTH DAILY 90 tablet 3   Blood Glucose Monitoring Suppl (ONETOUCH VERIO REFLECT) w/Device KIT TEST TWICE A DAY 1 kit 0   budesonide (ENTOCORT EC) 3 MG 24 hr capsule TAKE 2 CAPSULES BY MOUTH DAILY. 180 capsule 12   Continuous Glucose Sensor (DEXCOM G7 SENSOR) MISC 1 Device by Does not apply route as directed. 9 each 3   Eptinezumab-jjmr (VYEPTI) 100 MG/ML injection Inject 300 mg into the vein every 3 (three) months.     ferrous gluconate (FERGON) 324 MG tablet TAKE 1 TABLET (324 MG TOTAL) BY MOUTH DAILY WITH BREAKFAST. 90 tablet 1   gabapentin (NEURONTIN) 100 MG capsule TAKE 1 CAPSULE BY MOUTH UP TO 3 TIMES DAILY AS NEEDED. 270 capsule 3   gabapentin (NEURONTIN) 300 MG capsule Take 2 capsules (600 mg total) by mouth at bedtime. 180 capsule 1   insulin glargine (LANTUS SOLOSTAR) 100 UNIT/ML Solostar Pen Inject 18 Units into the skin daily. 15 mL 4   Insulin Pen Needle 32G X 4 MM MISC 1 Device by Does not apply route daily in the afternoon. 100 each 3   Lancets (ONETOUCH DELICA PLUS LANCET30G) MISC USE TO CHECK BLOOD SUGAR TWO TIMES DAILY 200 each 3   meclizine (ANTIVERT) 25 MG tablet Take 1 tablet (25 mg total) by mouth 3 (three) times daily as needed for dizziness. 30 tablet 1   ondansetron (ZOFRAN) 4 MG tablet Take 1 tablet (4 mg total) by mouth every 8 (eight) hours as needed for nausea or vomiting. 20 tablet 6   ONETOUCH VERIO test strip USE TO CHECK BLOOD SUGAR TWO TIMES A DAY 200 strip 3   potassium chloride SA (KLOR-CON M) 20 MEQ tablet TAKE 1 TABLET BY MOUTH DAILY. 90 tablet 3   repaglinide (PRANDIN) 1 MG tablet Take 1 tablet (1 mg total) by mouth daily before breakfast AND 2 tablets (2 mg total) daily before lunch AND 2 tablets (2 mg total) daily before supper. 450 tablet 2   Ubrogepant (UBRELVY) 100 MG TABS Take 1 tablet (100 mg total) by mouth as needed (May repeat dose after 2 hours if needed.  Maximum 2 tablets in 24 hours). 10 tablet 3   vitamin B-12  (CYANOCOBALAMIN) 1000 MCG tablet Take 1,000 mcg by mouth daily with breakfast.     Vitamin D, Ergocalciferol, (DRISDOL) 1.25 MG (50000 UNIT) CAPS capsule TAKE 1 CAPSULE BY MOUTH ONCE A WEEK. 12 capsule 3   colesevelam (WELCHOL) 625 MG tablet TAKE 3 TABLETS BY MOUTH 2 TIMES DAILY WITH A MEAL. 540 tablet 3   omeprazole (PRILOSEC) 20 MG capsule SMARTSIG:1 Capsule(s) By Mouth Morning-Night     metroNIDAZOLE (METROCREAM) 0.75 % cream SMARTSIG:sparingly Topical Twice Daily     No facility-administered medications prior to visit.     Per HPI unless specifically indicated in ROS section below Review of Systems  Constitutional:  Negative for fatigue and fever.  HENT:  Negative for congestion.  Eyes:  Negative for pain.  Respiratory:  Negative for cough and shortness of breath.   Cardiovascular:  Negative for chest pain, palpitations and leg swelling.  Gastrointestinal:  Negative for abdominal pain.  Genitourinary:  Negative for dysuria and vaginal bleeding.  Musculoskeletal:  Negative for back pain.  Neurological:  Negative for syncope, light-headedness and headaches.  Psychiatric/Behavioral:  Negative for dysphoric mood.    Objective:  BP (!) 140/70 (BP Location: Right Arm, Patient Position: Sitting, Cuff Size: Normal)   Pulse 78   Temp 98.7 F (37.1 C) (Temporal)   Ht 4' 11.65" (1.515 m)   Wt 131 lb 6 oz (59.6 kg)   SpO2 98%   BMI 25.96 kg/m   Wt Readings from Last 3 Encounters:  09/02/23 131 lb 6 oz (59.6 kg)  08/30/23 132 lb (59.9 kg)  08/24/23 133 lb (60.3 kg)      Physical Exam Constitutional:      General: She is not in acute distress.    Appearance: Normal appearance. She is well-developed. She is not ill-appearing or toxic-appearing.  HENT:     Head: Normocephalic.     Right Ear: Hearing, tympanic membrane, ear canal and external ear normal. Tympanic membrane is not erythematous, retracted or bulging.     Left Ear: Hearing, tympanic membrane, ear canal and external ear  normal. Tympanic membrane is not erythematous, retracted or bulging.     Nose: No mucosal edema or rhinorrhea.     Right Sinus: No maxillary sinus tenderness or frontal sinus tenderness.     Left Sinus: No maxillary sinus tenderness or frontal sinus tenderness.     Mouth/Throat:     Pharynx: Uvula midline.  Eyes:     General: Lids are normal. Lids are everted, no foreign bodies appreciated.     Conjunctiva/sclera: Conjunctivae normal.     Pupils: Pupils are equal, round, and reactive to light.  Neck:     Thyroid: No thyroid mass or thyromegaly.     Vascular: No carotid bruit.     Trachea: Trachea normal.  Cardiovascular:     Rate and Rhythm: Normal rate and regular rhythm.     Pulses: Normal pulses.     Heart sounds: Normal heart sounds, S1 normal and S2 normal. No murmur heard.    No friction rub. No gallop.  Pulmonary:     Effort: Pulmonary effort is normal. No tachypnea or respiratory distress.     Breath sounds: Normal breath sounds. No decreased breath sounds, wheezing, rhonchi or rales.  Abdominal:     General: Bowel sounds are normal.     Palpations: Abdomen is soft.     Tenderness: There is no abdominal tenderness.  Musculoskeletal:     Cervical back: Normal range of motion and neck supple.  Skin:    General: Skin is warm and dry.     Findings: No rash.  Neurological:     Mental Status: She is alert.  Psychiatric:        Mood and Affect: Mood is not anxious or depressed.        Speech: Speech normal.        Behavior: Behavior normal. Behavior is cooperative.        Thought Content: Thought content normal.        Judgment: Judgment normal.       Results for orders placed or performed in visit on 08/26/23  Microalbumin / creatinine urine ratio   Collection Time: 08/26/23  7:58 AM  Result  Value Ref Range   Microalb, Ur 0.0 0.0 - 1.9 mg/dL   Creatinine,U 21.3 mg/dL   Microalb Creat Ratio Unable to calculate 0.0 - 30.0 mg/g  Lipid panel   Collection Time: 08/26/23   7:58 AM  Result Value Ref Range   Cholesterol 221 (H) 0 - 200 mg/dL   Triglycerides 086.5 (H) 0.0 - 149.0 mg/dL   HDL 78.46 >96.29 mg/dL   VLDL 52.8 0.0 - 41.3 mg/dL   LDL Cholesterol 244 (H) 0 - 99 mg/dL   Total CHOL/HDL Ratio 4    NonHDL 163.75   Hemoglobin A1c   Collection Time: 08/26/23  7:58 AM  Result Value Ref Range   Hgb A1c MFr Bld 8.2 (H) 4.6 - 6.5 %  Comprehensive metabolic panel   Collection Time: 08/26/23  7:58 AM  Result Value Ref Range   Sodium 144 135 - 145 mEq/L   Potassium 3.6 3.5 - 5.1 mEq/L   Chloride 109 96 - 112 mEq/L   CO2 27 19 - 32 mEq/L   Glucose, Bld 92 70 - 99 mg/dL   BUN 16 6 - 23 mg/dL   Creatinine, Ser 0.10 0.40 - 1.20 mg/dL   Total Bilirubin 0.5 0.2 - 1.2 mg/dL   Alkaline Phosphatase 63 39 - 117 U/L   AST 13 0 - 37 U/L   ALT 11 0 - 35 U/L   Total Protein 6.9 6.0 - 8.3 g/dL   Albumin 3.8 3.5 - 5.2 g/dL   GFR 27.25 >36.64 mL/min   Calcium 9.2 8.4 - 10.5 mg/dL   *Note: Due to a large number of results and/or encounters for the requested time period, some results have not been displayed. A complete set of results can be found in Results Review.    This visit occurred during the SARS-CoV-2 public health emergency.  Safety protocols were in place, including screening questions prior to the visit, additional usage of staff PPE, and extensive cleaning of exam room while observing appropriate contact time as indicated for disinfecting solutions.   COVID 19 screen:  No recent travel or known exposure to COVID19 The patient denies respiratory symptoms of COVID 19 at this time. The importance of social distancing was discussed today.   Assessment and Plan   The patient's preventative maintenance and recommended screening tests for an annual wellness exam were reviewed in full today. Brought up to date unless services declined.  Counselled on the importance of diet, exercise, and its role in overall health and mortality. The patient's FH and SH was  reviewed, including their home life, tobacco status, and drug and alcohol status.    Vaccines: Uptodate Mammogram 02/2021  Colonoscopy: 07/2020  DEXA;  osteopenia.Marland Kitchen on budesonide so higher risk 02/2022  Problem List Items Addressed This Visit     Aortic atherosclerosis (HCC) (Chronic)   On high-dose statin.      Relevant Medications   Colesevelam HCl 3.75 g PACK   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HCC) (Chronic)   Chronic Followed by Dr. Meridee Score. Chronic budesonide. She is feeling better altogether.      Relevant Medications   pantoprazole (PROTONIX) 40 MG tablet   Chronic kidney disease, stage 3a (HCC)   Chronic,  improved.Marland Kitchen GFR 65      History of DVT (deep vein thrombosis)   On chronic Eliquis for DVT/PE history      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HCC)   History , 02/2023 cbc  normal platelets.      Hoarseness, persistent  Omeprazole 40 did not help... will try pantoprazole 40 mg daily... and Pepcid AC twice daily       Hyperlipidemia associated with type 2 diabetes mellitus (HCC) (Chronic)   Chronic, inadequate control but on max statin and WelChol.  Working on lifestyle change.      Relevant Medications   Colesevelam HCl 3.75 g PACK   Hypertension associated with diabetes (HCC) (Chronic)   Chronic, border line.. Not or ARB any longer.   Will follow.. consider increasing losartan back 25 mg daily       Relevant Medications   Colesevelam HCl 3.75 g PACK   IgG4 related disease (HCC)    Per GI      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HCC)   On chronic budesonide      Multinodular goiter   Followed by endocrine      Osteopenia   Relevant Orders   DG Bone Density   Type 2 diabetes mellitus with stage 3a chronic kidney disease, with long-term current use of insulin (HCC)   Chronic, significant improvement with assistance of endocrinology on current regimen.  As she has been working on healthy eating and has lost a significant  amount of weight. She has been able to decrease insulin and continues on repaglinide.      Vitamin D deficiency    On supplement.      Other Visit Diagnoses       Routine general medical examination at a health care facility    -  Primary       Meds ordered this encounter  Medications   Colesevelam HCl 3.75 g PACK    Sig: Take 1 packet (3.75 g total) by mouth daily.    Dispense:  30 each    Refill:  11   pantoprazole (PROTONIX) 40 MG tablet    Sig: Take 1 tablet (40 mg total) by mouth daily.    Dispense:  30 tablet    Refill:  3   Orders Placed This Encounter  Procedures   DG Bone Density    Standing Status:   Future    Expiration Date:   09/01/2024    Reason for Exam (SYMPTOM  OR DIAGNOSIS REQUIRED):   osteopenia    Preferred imaging location?:   [facility]    Kerby Nora, MD

## 2023-09-05 ENCOUNTER — Encounter: Payer: Self-pay | Admitting: Internal Medicine

## 2023-09-05 ENCOUNTER — Other Ambulatory Visit: Payer: Self-pay | Admitting: Family Medicine

## 2023-09-05 DIAGNOSIS — Z1231 Encounter for screening mammogram for malignant neoplasm of breast: Secondary | ICD-10-CM

## 2023-09-05 NOTE — Telephone Encounter (Signed)
 Answering in absence of [name], with a low blood sugar in the overnight I would recommend to decrease Lantus from 18 units to 15 units daily.  Message or call to our clinic if she continue to have hypoglycemia/low blood sugar.  Debra Luticia Tadros, MD [facility] [address] [place], [address] [place], [place] [address] Phone # [phone]

## 2023-09-05 NOTE — Telephone Encounter (Signed)
 Pt made aware. Pt verbalized understanding with all questions answered.

## 2023-09-09 ENCOUNTER — Telehealth: Payer: Self-pay | Admitting: *Deleted

## 2023-09-09 NOTE — Telephone Encounter (Signed)
 Noted TY

## 2023-09-09 NOTE — Telephone Encounter (Signed)
 Please refer to phone note on 3/28/ 2025

## 2023-09-12 ENCOUNTER — Telehealth: Payer: Self-pay | Admitting: Gastroenterology

## 2023-09-12 ENCOUNTER — Ambulatory Visit (AMBULATORY_SURGERY_CENTER): Admitting: *Deleted

## 2023-09-12 VITALS — Ht 59.0 in | Wt 129.0 lb

## 2023-09-12 DIAGNOSIS — D8984 IgG4-related disease: Secondary | ICD-10-CM

## 2023-09-12 DIAGNOSIS — Z8619 Personal history of other infectious and parasitic diseases: Secondary | ICD-10-CM

## 2023-09-12 DIAGNOSIS — R1310 Dysphagia, unspecified: Secondary | ICD-10-CM

## 2023-09-12 DIAGNOSIS — K861 Other chronic pancreatitis: Secondary | ICD-10-CM

## 2023-09-12 NOTE — Telephone Encounter (Signed)
 I think that is reasonable.  For change in bowel habits and symptomatology it would make sense to further evaluate these symptoms if she desires. GM

## 2023-09-12 NOTE — Telephone Encounter (Signed)
 Inbound call from patient, states she has fecal smears and fecal leakage and was advised by the pre- visit nurse during her pre visit today to speak to dr. Elesa Hacker nurse in regards to adding a colonoscopy to her procedure on 5/7 at 7:30 AM. Patient states she will be gone in the morning until 11 am, but will be home afterwards.

## 2023-09-12 NOTE — Telephone Encounter (Signed)
 Dr Meridee Score can we add colon to this pt's already scheduled EGD?

## 2023-09-12 NOTE — Progress Notes (Addendum)
 Pt's name and DOB verified at the beginning of the pre-visit wit 2 identifiers  Permission given to speak with  Pt denies any difficulty with ambulating,sitting, laying down or rolling side to side  Pt has no issues with ambulation   Pt has no issues moving head neck or swallowing  No egg or soy allergy known to patient   HX of PONV and hard to wake  Pt denies having issues being intubated  No FH of Malignant Hyperthermia  Pt is not on diet pills or shots  Pt is not on home 02   Pt is not on blood thinners   Pt denies issues with constipation   Pt is not on dialysis Hx of MVP but no issues for at least 10 years per pt  Pt denies any upcoming cardiac testing   Chart not reviewed by CRNA prior to [facility]  Visit by phone  Pt states weight is 129 lb  IInstructions reviewed. Pt given [facility] main # and MD on call # prior to instructions.  Pt states understanding of instructions. Instructed to review again prior to procedure. Pt states they will.   Informed pt that they will receive a text or  call from [facility] regarding there prep med. Last 2-3 months has had stool leakage. Had C-Dif last year

## 2023-09-13 ENCOUNTER — Encounter: Payer: Self-pay | Admitting: Gastroenterology

## 2023-09-13 ENCOUNTER — Telehealth: Payer: Self-pay | Admitting: *Deleted

## 2023-09-13 ENCOUNTER — Other Ambulatory Visit: Payer: Self-pay

## 2023-09-13 DIAGNOSIS — R194 Change in bowel habit: Secondary | ICD-10-CM

## 2023-09-13 MED ORDER — NA SULFATE-K SULFATE-MG SULF 17.5-3.13-1.6 GM/177ML PO SOLN: 1.0000 | Freq: Once | ORAL | 0 refills | Status: AC

## 2023-09-13 NOTE — Telephone Encounter (Signed)
 The pt states she spoke with Chip Boer in previsit and provided information to her regarding health history. She states she reviewed the information and it was entered incorrectly.  She would like a call from pre visit to get the information corrected.

## 2023-09-13 NOTE — Telephone Encounter (Signed)
 Colon has been added, referral made, prep sent and instructions entered and sent to pt My Chart and home.   Left message on machine to call back

## 2023-09-13 NOTE — Telephone Encounter (Signed)
 Patient is returning your call.

## 2023-09-13 NOTE — Telephone Encounter (Signed)
Colon scheduled, pt instructed and medications reviewed.  Patient instructions mailed to home and sent to My Chart.  Patient to call with any questions or concerns.  ? ?

## 2023-09-13 NOTE — Telephone Encounter (Signed)
 Pt has concerns with dx shown on After visit Summary. RN reviewed with manager and found that DX listed are generated by MD and where present prior to PV. Pt was informed that MD is the ones that put the dx in and that RN was not able to remove them. Pt to follow up with MD to correct issue. No other questions at this time.

## 2023-09-14 NOTE — Telephone Encounter (Signed)
 Dr Meridee Score please review pt question.

## 2023-09-15 ENCOUNTER — Encounter: Payer: Self-pay | Admitting: Gastroenterology

## 2023-09-15 ENCOUNTER — Encounter: Payer: Self-pay | Admitting: Internal Medicine

## 2023-09-15 DIAGNOSIS — K861 Other chronic pancreatitis: Secondary | ICD-10-CM

## 2023-09-15 DIAGNOSIS — R194 Change in bowel habit: Secondary | ICD-10-CM

## 2023-09-15 DIAGNOSIS — R1310 Dysphagia, unspecified: Secondary | ICD-10-CM

## 2023-09-15 DIAGNOSIS — R634 Abnormal weight loss: Secondary | ICD-10-CM

## 2023-09-15 DIAGNOSIS — R197 Diarrhea, unspecified: Secondary | ICD-10-CM

## 2023-09-15 DIAGNOSIS — D8984 IgG4-related disease: Secondary | ICD-10-CM

## 2023-09-15 MED ORDER — PEG 3350-KCL-NA BICARB-NACL 420 G PO SOLR: 4000.0000 mL | Freq: Once | ORAL | 0 refills | Status: AC

## 2023-09-15 NOTE — Telephone Encounter (Signed)
 I would recommend to get blood sugar reading in the early morning fasting that will help to adjust the dose of Lantus.  Blood sugar in 200s she mentioned after meals, please check with the patient how long after meals she is checking the blood sugar with glucometer.  She probably need to adjust the dose of repaglinide if she persistently have high blood sugar after eating.  I will leave up to [name] for her to adjust the medication doses.

## 2023-09-15 NOTE — Addendum Note (Signed)
 Addended by: Loretha Stapler on: 09/15/2023 08:13 AM   Modules accepted: Orders

## 2023-09-15 NOTE — Telephone Encounter (Signed)
 Dr Meridee Score can you please review? Did you enter?

## 2023-09-16 NOTE — Telephone Encounter (Signed)
 Pre visit nurses see the note from Dr Meridee Score

## 2023-09-16 NOTE — Telephone Encounter (Signed)
 Debra Leach please see attached, thank you

## 2023-09-16 NOTE — Telephone Encounter (Signed)
 Patty, Please let the patient know that in my formal notes in clinic, I have never documented any of those things. Where I see this seems to be found, is in the AVS from 4/7 and ambulatory gastroenterology referral that was placed internally (looking like it is from the previsit). In there it has associated diagnoses of chronic pancreatitis and history of hepatitis B.  I agree this needs to be changed. Instead of chronic pancreatitis, unspecified this should say autoimmune pancreatitis. She has no history of hepatitis B, so please have this removed as well. Thanks. GM

## 2023-09-21 ENCOUNTER — Telehealth: Payer: Self-pay | Admitting: Gastroenterology

## 2023-09-21 NOTE — Telephone Encounter (Signed)
 Patient called and stated that she has some question on weather or not should could these two medication called Budesonide and Colesevelam. Patient is requesting a call back. Please advise.

## 2023-09-21 NOTE — Telephone Encounter (Signed)
 Returned patient call and discussed medication question.  Patient will hold those two particular medications the morning of her procedures.

## 2023-09-21 NOTE — Telephone Encounter (Signed)
 Per MD request Ambulatory referral has been changed to Autoimmune pancreatitis.

## 2023-09-27 ENCOUNTER — Encounter: Payer: Self-pay | Admitting: Family Medicine

## 2023-09-29 ENCOUNTER — Encounter: Payer: Self-pay | Admitting: Neurology

## 2023-09-29 LAB — HM DIABETES EYE EXAM

## 2023-10-03 ENCOUNTER — Encounter: Payer: Self-pay | Admitting: Gastroenterology

## 2023-10-03 NOTE — Telephone Encounter (Signed)
 Please have patient come in for CBC/CMP/amylase/lipase/IgG4 antibody level. Thanks. GM

## 2023-10-04 ENCOUNTER — Encounter: Payer: Self-pay | Admitting: Family Medicine

## 2023-10-04 ENCOUNTER — Ambulatory Visit: Admitting: Family Medicine

## 2023-10-04 VITALS — BP 124/78 | HR 69 | Temp 99.2°F | Ht 59.65 in | Wt 130.5 lb

## 2023-10-04 DIAGNOSIS — L570 Actinic keratosis: Secondary | ICD-10-CM | POA: Diagnosis not present

## 2023-10-04 NOTE — Telephone Encounter (Signed)
 Dr Brice Campi ok to fill ondansetron ?

## 2023-10-04 NOTE — Telephone Encounter (Signed)
 Okay for Zofran .  8 mg every 8 hour as needed (30/1).  If patient feels this is too high of a dose then we can do 4 mg every 8 hours as needed (30/1). Thanks. GM

## 2023-10-04 NOTE — Assessment & Plan Note (Addendum)
 Procedure Note for Cryotherapy Dx: actinic keratosis Location: upper lip Size: 0.8 cm Patient gave consent for procedure . Patient notified of potential for scarring at site of cryotherapy. Area of concern treated with cryotherapy for 2 freeze thaw cycles with < 1 mm border of freeze. No complications. Wound care discussed.  Patient instructed if actinic keratosis returns she is to return to see dermatology.

## 2023-10-04 NOTE — Progress Notes (Signed)
 Patient ID: Debra Leach, female    DOB: 05-Apr-1951, 73 y.o.   MRN: [id number]  This visit was conducted in person.  BP 124/78 (BP Location: Left Arm, Patient Position: Sitting, Cuff Size: Normal)   Pulse 69   Temp 99.2 F (37.3 C) (Temporal)   Ht 4' 11.65" (1.515 m)   Wt 130 lb 8 oz (59.2 kg)   SpO2 95%   BMI 25.79 kg/m    CC:  Chief Complaint  Patient presents with   Actinic Keratosis    Freeze area on Lip    Subjective:   HPI: Debra Leach is a 73 y.o. female presenting on 10/04/2023 for Actinic Keratosis (Freeze area on Lip)  She reports several months ago dermatology froze off again actinic keratosis above her lip.  In the intervening months the lesion has returned.  I reviewed the dermatology note in detail.   No bleeding or pus at site.       Relevant past medical, surgical, family and social history reviewed and updated as indicated. Interim medical history since our last visit reviewed. Allergies and medications reviewed and updated. Outpatient Medications Prior to Visit  Medication Sig Dispense Refill   aluminum-magnesium  hydroxide 200-200 MG/5ML suspension Take by mouth every 6 (six) hours as needed for indigestion. As needed     apixaban  (ELIQUIS ) 2.5 MG TABS tablet TAKE 1 TABLET BY MOUTH 2 TIMES DAILY. 180 tablet 0   atorvastatin  (LIPITOR ) 80 MG tablet TAKE 1 TABLET BY MOUTH DAILY 90 tablet 3   Blood Glucose Monitoring Suppl (ONETOUCH VERIO REFLECT) w/Device KIT TEST TWICE A DAY 1 kit 0   budesonide  (ENTOCORT EC ) 3 MG 24 hr capsule TAKE 2 CAPSULES BY MOUTH DAILY. 180 capsule 12   Colesevelam  HCl 3.75 g PACK Take 1 packet (3.75 g total) by mouth daily. 30 each 11   Continuous Glucose Sensor (DEXCOM G7 SENSOR) MISC 1 Device by Does not apply route as directed. 9 each 3   Eptinezumab-jjmr (VYEPTI) 100 MG/ML injection Inject 300 mg into the vein every 3 (three) months.     famotidine  (PEPCID ) 10 MG tablet Take 10 mg by mouth 2 (two) times  daily.     ferrous gluconate  (FERGON) 324 MG tablet TAKE 1 TABLET (324 MG TOTAL) BY MOUTH DAILY WITH BREAKFAST. 90 tablet 1   gabapentin  (NEURONTIN ) 100 MG capsule TAKE 1 CAPSULE BY MOUTH UP TO 3 TIMES DAILY AS NEEDED. 270 capsule 3   gabapentin  (NEURONTIN ) 300 MG capsule Take 2 capsules (600 mg total) by mouth at bedtime. 180 capsule 1   insulin  glargine (LANTUS  SOLOSTAR) 100 UNIT/ML Solostar Pen Inject 15 Units into the skin daily.     Insulin  Pen Needle 32G X 4 MM MISC 1 Device by Does not apply route daily in the afternoon. 100 each 3   Lancets (ONETOUCH DELICA PLUS LANCET30G) MISC USE TO CHECK BLOOD SUGAR TWO TIMES DAILY 200 each 3   meclizine  (ANTIVERT ) 25 MG tablet Take 1 tablet (25 mg total) by mouth 3 (three) times daily as needed for dizziness. 30 tablet 1   ondansetron  (ZOFRAN ) 4 MG tablet Take 1 tablet (4 mg total) by mouth every 8 (eight) hours as needed for nausea or vomiting. 20 tablet 6   ONETOUCH VERIO test strip USE TO CHECK BLOOD SUGAR TWO TIMES A DAY 200 strip 3   pantoprazole  (PROTONIX ) 40 MG tablet Take 1 tablet (40 mg total) by mouth daily. 30 tablet 3   potassium chloride  SA (  KLOR-CON  M) 20 MEQ tablet TAKE 1 TABLET BY MOUTH DAILY. 90 tablet 3   repaglinide  (PRANDIN ) 1 MG tablet Take 1 tablet (1 mg total) by mouth daily before breakfast AND 2 tablets (2 mg total) daily before lunch AND 2 tablets (2 mg total) daily before supper. 450 tablet 2   Ubrogepant  (UBRELVY ) 100 MG TABS Take 1 tablet (100 mg total) by mouth as needed (May repeat dose after 2 hours if needed.  Maximum 2 tablets in 24 hours). 10 tablet 3   vitamin B-12 (CYANOCOBALAMIN ) 1000 MCG tablet Take 1,000 mcg by mouth daily with breakfast.     Vitamin D , Ergocalciferol , (DRISDOL ) 1.25 MG (50000 UNIT) CAPS capsule TAKE 1 CAPSULE BY MOUTH ONCE A WEEK. 12 capsule 3   insulin  glargine (LANTUS  SOLOSTAR) 100 UNIT/ML Solostar Pen Inject 18 Units into the skin daily. (Patient taking differently: Inject 15 Units into the skin  daily.) 15 mL 4   No facility-administered medications prior to visit.     Per HPI unless specifically indicated in ROS section below Review of Systems  Constitutional:  Negative for fatigue and fever.  HENT:  Negative for ear pain.   Eyes:  Negative for pain.  Respiratory:  Negative for chest tightness and shortness of breath.   Cardiovascular:  Negative for chest pain, palpitations and leg swelling.  Gastrointestinal:  Negative for abdominal pain.  Genitourinary:  Negative for dysuria.   Objective:  BP 124/78 (BP Location: Left Arm, Patient Position: Sitting, Cuff Size: Normal)   Pulse 69   Temp 99.2 F (37.3 C) (Temporal)   Ht 4' 11.65" (1.515 m)   Wt 130 lb 8 oz (59.2 kg)   SpO2 95%   BMI 25.79 kg/m   Wt Readings from Last 3 Encounters:  10/04/23 130 lb 8 oz (59.2 kg)  09/12/23 129 lb (58.5 kg)  09/02/23 131 lb 6 oz (59.6 kg)      Physical Exam Skin:    Findings: Lesion present.     Comments:  See photo       Results for orders placed or performed in visit on 08/26/23  Microalbumin / creatinine urine ratio   Collection Time: 08/26/23  7:58 AM  Result Value Ref Range   Microalb, Ur 0.0 0.0 - 1.9 mg/dL   Creatinine,U 16.1 mg/dL   Microalb Creat Ratio Unable to calculate 0.0 - 30.0 mg/g  Lipid panel   Collection Time: 08/26/23  7:58 AM  Result Value Ref Range   Cholesterol 221 (H) 0 - 200 mg/dL   Triglycerides 096.0 (H) 0.0 - 149.0 mg/dL   HDL 45.40 >98.11 mg/dL   VLDL 91.4 0.0 - 78.2 mg/dL   LDL Cholesterol 956 (H) 0 - 99 mg/dL   Total CHOL/HDL Ratio 4    NonHDL 163.75   Hemoglobin A1c   Collection Time: 08/26/23  7:58 AM  Result Value Ref Range   Hgb A1c MFr Bld 8.2 (H) 4.6 - 6.5 %  Comprehensive metabolic panel   Collection Time: 08/26/23  7:58 AM  Result Value Ref Range   Sodium 144 135 - 145 mEq/L   Potassium 3.6 3.5 - 5.1 mEq/L   Chloride 109 96 - 112 mEq/L   CO2 27 19 - 32 mEq/L   Glucose, Bld 92 70 - 99 mg/dL   BUN 16 6 - 23 mg/dL    Creatinine, Ser 2.13 0.40 - 1.20 mg/dL   Total Bilirubin 0.5 0.2 - 1.2 mg/dL   Alkaline Phosphatase 63 39 - 117 U/L  AST 13 0 - 37 U/L   ALT 11 0 - 35 U/L   Total Protein 6.9 6.0 - 8.3 g/dL   Albumin 3.8 3.5 - 5.2 g/dL   GFR 40.98 >11.91 mL/min   Calcium  9.2 8.4 - 10.5 mg/dL   *Note: Due to a large number of results and/or encounters for the requested time period, some results have not been displayed. A complete set of results can be found in Results Review.    Assessment and Plan  Actinic keratosis Assessment & Plan: Procedure Note for Cryotherapy Dx: actinic keratosis Location: upper lip Size: 0.8 cm Patient gave consent for procedure . Patient notified of potential for scarring at site of cryotherapy. Area of concern treated with cryotherapy for 2 freeze thaw cycles with < 1 mm border of freeze. No complications. Wound care discussed.  Patient instructed if actinic keratosis returns she is to return to see dermatology.      No follow-ups on file.   Herby Lolling, MD

## 2023-10-04 NOTE — Addendum Note (Signed)
 Addended by: Aneita Keens on: 10/04/2023 08:13 AM   Modules accepted: Orders

## 2023-10-05 MED ORDER — ONDANSETRON HCL 8 MG PO TABS: 8.0000 mg | ORAL_TABLET | Freq: Three times a day (TID) | ORAL | 1 refills | Status: AC | PRN

## 2023-10-05 NOTE — Addendum Note (Signed)
 Addended by: Aneita Keens on: 10/05/2023 11:26 AM   Modules accepted: Orders

## 2023-10-06 ENCOUNTER — Other Ambulatory Visit (INDEPENDENT_AMBULATORY_CARE_PROVIDER_SITE_OTHER)

## 2023-10-06 DIAGNOSIS — R634 Abnormal weight loss: Secondary | ICD-10-CM

## 2023-10-06 DIAGNOSIS — R194 Change in bowel habit: Secondary | ICD-10-CM

## 2023-10-06 DIAGNOSIS — R1310 Dysphagia, unspecified: Secondary | ICD-10-CM | POA: Diagnosis not present

## 2023-10-06 DIAGNOSIS — D8984 IgG4-related disease: Secondary | ICD-10-CM

## 2023-10-06 DIAGNOSIS — R197 Diarrhea, unspecified: Secondary | ICD-10-CM

## 2023-10-06 DIAGNOSIS — K861 Other chronic pancreatitis: Secondary | ICD-10-CM

## 2023-10-06 LAB — COMPREHENSIVE METABOLIC PANEL WITH GFR
ALT: 14 U/L (ref 0–35)
AST: 13 U/L (ref 0–37)
Albumin: 3.8 g/dL (ref 3.5–5.2)
Alkaline Phosphatase: 63 U/L (ref 39–117)
BUN: 16 mg/dL (ref 6–23)
CO2: 28 meq/L (ref 19–32)
Calcium: 8.9 mg/dL (ref 8.4–10.5)
Chloride: 106 meq/L (ref 96–112)
Creatinine, Ser: 0.92 mg/dL (ref 0.40–1.20)
GFR: 62.24 mL/min (ref >60.00)
Glucose, Bld: 173 mg/dL — ABNORMAL HIGH (ref 70–99)
Potassium: 3.5 meq/L (ref 3.5–5.1)
Sodium: 143 meq/L (ref 135–145)
Total Bilirubin: 0.4 mg/dL (ref 0.2–1.2)
Total Protein: 7 g/dL (ref 6.0–8.3)

## 2023-10-06 LAB — AMYLASE: Amylase: 17 U/L — ABNORMAL LOW (ref 27–131)

## 2023-10-06 LAB — CBC WITH DIFFERENTIAL/PLATELET
Basophils Absolute: 0.1 10*3/uL (ref 0.0–0.1)
Basophils Relative: 1 % (ref 0.0–3.0)
Eosinophils Absolute: 0.1 10*3/uL (ref 0.0–0.7)
Eosinophils Relative: 1.5 % (ref 0.0–5.0)
HCT: 43.4 % (ref 36.0–46.0)
Hemoglobin: 14.1 g/dL (ref 12.0–15.0)
Lymphocytes Relative: 15.4 % (ref 12.0–46.0)
Lymphs Abs: 1.5 10*3/uL (ref 0.7–4.0)
MCHC: 32.4 g/dL (ref 30.0–36.0)
MCV: 92.5 fl (ref 78.0–100.0)
Monocytes Absolute: 0.6 10*3/uL (ref 0.1–1.0)
Monocytes Relative: 6.2 % (ref 3.0–12.0)
Neutro Abs: 7.2 10*3/uL (ref 1.4–7.7)
Neutrophils Relative %: 75.9 % (ref 43.0–77.0)
Platelets: 269 10*3/uL (ref 150.0–400.0)
RBC: 4.7 Mil/uL (ref 3.87–5.11)
RDW: 13.8 % (ref 11.5–15.5)
WBC: 9.5 10*3/uL (ref 4.0–10.5)

## 2023-10-06 LAB — LIPASE: Lipase: 5 U/L — ABNORMAL LOW (ref 11.0–59.0)

## 2023-10-07 ENCOUNTER — Other Ambulatory Visit

## 2023-10-07 DIAGNOSIS — K861 Other chronic pancreatitis: Secondary | ICD-10-CM

## 2023-10-07 DIAGNOSIS — R197 Diarrhea, unspecified: Secondary | ICD-10-CM

## 2023-10-07 DIAGNOSIS — R634 Abnormal weight loss: Secondary | ICD-10-CM

## 2023-10-07 LAB — IGG 4: IgG, Subclass 4: 208 mg/dL — ABNORMAL HIGH (ref 2–96)

## 2023-10-10 ENCOUNTER — Encounter: Payer: Self-pay | Admitting: Gastroenterology

## 2023-10-10 LAB — CALPROTECTIN, FECAL: Calprotectin, Fecal: 279 ug/g — ABNORMAL HIGH (ref 0–120)

## 2023-10-11 ENCOUNTER — Other Ambulatory Visit: Payer: Self-pay

## 2023-10-11 DIAGNOSIS — R634 Abnormal weight loss: Secondary | ICD-10-CM

## 2023-10-11 DIAGNOSIS — K861 Other chronic pancreatitis: Secondary | ICD-10-CM

## 2023-10-11 DIAGNOSIS — D8984 IgG4-related disease: Secondary | ICD-10-CM

## 2023-10-11 DIAGNOSIS — R194 Change in bowel habit: Secondary | ICD-10-CM

## 2023-10-11 DIAGNOSIS — R197 Diarrhea, unspecified: Secondary | ICD-10-CM

## 2023-10-11 NOTE — Progress Notes (Signed)
 Order for fecal calprotectin placed for 2 months. ( 12/2023).

## 2023-10-12 ENCOUNTER — Encounter: Payer: Self-pay | Admitting: Gastroenterology

## 2023-10-12 ENCOUNTER — Ambulatory Visit: Admitting: Gastroenterology

## 2023-10-12 VITALS — BP 156/83 | HR 79 | Temp 98.1°F | Resp 14 | Ht 59.0 in | Wt 129.0 lb

## 2023-10-12 DIAGNOSIS — K641 Second degree hemorrhoids: Secondary | ICD-10-CM | POA: Diagnosis not present

## 2023-10-12 DIAGNOSIS — K3189 Other diseases of stomach and duodenum: Secondary | ICD-10-CM | POA: Diagnosis not present

## 2023-10-12 DIAGNOSIS — K317 Polyp of stomach and duodenum: Secondary | ICD-10-CM | POA: Diagnosis not present

## 2023-10-12 DIAGNOSIS — K229 Disease of esophagus, unspecified: Secondary | ICD-10-CM

## 2023-10-12 DIAGNOSIS — Z8619 Personal history of other infectious and parasitic diseases: Secondary | ICD-10-CM

## 2023-10-12 DIAGNOSIS — K449 Diaphragmatic hernia without obstruction or gangrene: Secondary | ICD-10-CM

## 2023-10-12 DIAGNOSIS — D8984 IgG4-related disease: Secondary | ICD-10-CM

## 2023-10-12 DIAGNOSIS — K222 Esophageal obstruction: Secondary | ICD-10-CM

## 2023-10-12 DIAGNOSIS — D124 Benign neoplasm of descending colon: Secondary | ICD-10-CM

## 2023-10-12 DIAGNOSIS — K2289 Other specified disease of esophagus: Secondary | ICD-10-CM | POA: Diagnosis not present

## 2023-10-12 DIAGNOSIS — R194 Change in bowel habit: Secondary | ICD-10-CM | POA: Diagnosis not present

## 2023-10-12 DIAGNOSIS — R1310 Dysphagia, unspecified: Secondary | ICD-10-CM | POA: Diagnosis not present

## 2023-10-12 DIAGNOSIS — D123 Benign neoplasm of transverse colon: Secondary | ICD-10-CM

## 2023-10-12 DIAGNOSIS — D122 Benign neoplasm of ascending colon: Secondary | ICD-10-CM | POA: Diagnosis not present

## 2023-10-12 DIAGNOSIS — K861 Other chronic pancreatitis: Secondary | ICD-10-CM

## 2023-10-12 DIAGNOSIS — K6289 Other specified diseases of anus and rectum: Secondary | ICD-10-CM

## 2023-10-12 DIAGNOSIS — R1319 Other dysphagia: Secondary | ICD-10-CM

## 2023-10-12 MED ORDER — SODIUM CHLORIDE 0.9 % IV SOLN
500.0000 mL | INTRAVENOUS | Status: AC
Start: 2023-10-12 — End: 2023-10-13

## 2023-10-12 NOTE — Progress Notes (Signed)
 Pt's states no medical or surgical changes since previsit or office visit.

## 2023-10-12 NOTE — Op Note (Signed)
 [facility] Patient Name: Debra Leach Procedure Date: 10/12/2023 8:43 AM MRN: [id number] Endoscopist: Yong Henle , MD, [id number] Age: 73 Referring MD:  Date of Birth: [birth year] Gender: Female Account #: [id number] Procedure:                Upper GI endoscopy Indications:              Dysphagia, Nausea Medicines:                Monitored Anesthesia Care Procedure:                Pre-Anesthesia Assessment:                           - Prior to the procedure, a History and Physical                            was performed, and patient medications and                            allergies were reviewed. The patient's tolerance of                            previous anesthesia was also reviewed. The risks                            and benefits of the procedure and the sedation                            options and risks were discussed with the patient.                            All questions were answered, and informed consent                            was obtained. Prior Anticoagulants: The patient has                            taken Eliquis  (apixaban ), last dose was 2 days                            prior to procedure. ASA Grade Assessment: II - A                            patient with mild systemic disease. After reviewing                            the risks and benefits, the patient was deemed in                            satisfactory condition to undergo the procedure.                           After obtaining informed consent, the endoscope was  passed under direct vision. Throughout the                            procedure, the patient's blood pressure, pulse, and                            oxygen saturations were monitored continuously. The                            GIF [id number] #[id number] was introduced through the                            mouth, and advanced to the second part of duodenum.                            The upper  GI endoscopy was accomplished without                            difficulty. The patient tolerated the procedure. Scope In: Scope Out: Findings:                 No gross lesions were noted in the entire                            esophagus. A guidewire was placed and the scope was                            withdrawn. Dilation was performed with a Savary                            dilator with no resistance at 18 mm and mild                            resistance at 19 mm. The dilation site was examined                            following endoscope reinsertion and showed moderate                            mucosal disruption, moderate improvement in luminal                            narrowing and no perforation.                           The Z-line was irregular and was found 34 cm from                            the incisors.                           A 3 cm hiatal hernia was present.  A single 8 mm sessile polyp with no bleeding and                            stigmata of recent bleeding was found in the                            gastric antrum. The polyp was removed with a cold                            snare. Resection and retrieval were complete.                           Patchy mildly erythematous mucosa without bleeding                            was found in the entire examined stomach. Biopsies                            were taken with a cold forceps for histology and                            Helicobacter pylori testing.                           No gross lesions were noted in the duodenal bulb,                            in the first portion of the duodenum and in the                            second portion of the duodenum. Complications:            No immediate complications. Estimated Blood Loss:     Estimated blood loss was minimal. Impression:               - No gross lesions in the entire esophagus. Dilated.                           -  Z-line irregular, 34 cm from the incisors.                           - 3 cm hiatal hernia.                           - A single gastric polyp. Resected and retrieved.                           - Erythematous mucosa in the stomach. Biopsied.                           - No gross lesions in the duodenal bulb, in the                            first portion  of the duodenum and in the second                            portion of the duodenum. Recommendation:           - Proceed to scheduled colonoscopy.                           - Dilation diet as per protocol.                           - Please use Cepacol or Halls Lozenges +/-                            Chloraseptic spray for next 72-96 hours to aid in                            sore thoat should you experience this.                           - Observe patient's clinical course.                           - Continue PPI with daily.                           - Anticoagulation restart as per colonoscopy report.                           - If patient continues to have dysphagia symptoms                            without significant time between issues, then                            consider esophageal manometry. If she does well for                            a while could consider repeat dilation up to 19 or                            20 mm since we did cause a mucosal wrent at 19 mm.                           - Await pathology results.                           - If patient is found to have dysplastic tissue,                            will need 1 year follow-up endoscopy.                           - The findings and recommendations were discussed  with the patient.                           - The findings and recommendations were discussed                            with the patient's family. Yong Henle, MD 10/12/2023 9:29:11 AM

## 2023-10-12 NOTE — Progress Notes (Signed)
 GASTROENTEROLOGY PROCEDURE H&P NOTE   Primary Care Physician: Judithann Novas, MD  HPI: Debra Leach is a 73 y.o. female who presents for EGD/Colonoscopy for dysphagia and change in bowel habits with underlying EPI and elevated fecal calprotectin.  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HCC)    Cataract    Chronic kidney disease    Clotting disorder (HCC)    Complication of anesthesia    difficult waking   Diabetes mellitus without complication (HCC)    Diet and exercise controlled   Diverticulosis    DVT (deep venous thrombosis) (HCC) 02/2019   3calf 2 right lung 1 left lung   DVT (deep venous thrombosis) (HCC) 2020   right leg   Dyspnea    GERD (gastroesophageal reflux disease)    Headache    Hepatic steatosis    Hiatal hernia    History of kidney stones    Hyperlipidemia    MVP (mitral valve prolapse)    history of   PONV (postoperative nausea and vomiting)    Pulmonary emboli (HCC) 03/01/2019   2 in the right; 1 in the left   Past Surgical History:  Procedure Laterality Date   arm surgery  2012   left forearm fracture w/ plates pins screws   ARTERY BIOPSY Right 12/03/2021   Procedure: BIOPSY TEMPORAL ARTERY;  Surgeon: Celso College, MD;  Location: [facility];  Service: Vascular;  Laterality: Right;  Right temple   BREAST BIOPSY Left    BREAST SURGERY  2000   breast biopsy (benign)   CATARACT EXTRACTION Right 2005   CATARACT EXTRACTION Left 2010   LITHOTRIPSY  08/12/2008   stent placed bilaterally   PANCREAS BIOPSY  2021   PANCREAS BIOPSY  2019   PARTIAL HYSTERECTOMY     Both ovaries remain, vaginal, for mennorhagia   REPAIR EXTENSOR TENDON Right 06/30/2022   Procedure: Middle and ring finger sagittal band reconstruction;  Surgeon: Marilyn Shropshire, MD;  Location: [facility];  Service: Orthopedics;  Laterality: Right;   TENDON REPAIR Left 04/06/2023    Procedure: MIDDLE AND RING FINGER SAGITTAL BAND RECONSTRUCTION;  Surgeon: Marilyn Shropshire, MD;  Location: [facility];  Service: Orthopedics;  Laterality: Left;  regional120   TONSILLECTOMY     TUBAL LIGATION  1980   Current Outpatient Medications  Medication Sig Dispense Refill   aluminum-magnesium  hydroxide 200-200 MG/5ML suspension Take by mouth every 6 (six) hours as needed for indigestion. As needed     apixaban  (ELIQUIS ) 2.5 MG TABS tablet TAKE 1 TABLET BY MOUTH 2 TIMES DAILY. 180 tablet 0   atorvastatin  (LIPITOR ) 80 MG tablet TAKE 1 TABLET BY MOUTH DAILY 90 tablet 3   Blood Glucose Monitoring Suppl (ONETOUCH VERIO REFLECT) w/Device KIT TEST TWICE A DAY 1 kit 0   budesonide  (ENTOCORT EC ) 3 MG 24 hr capsule TAKE 2 CAPSULES BY MOUTH DAILY. 180 capsule 12   Colesevelam  HCl 3.75 g PACK Take 1 packet (3.75 g total) by mouth daily. 30 each 11   Continuous Glucose Sensor (DEXCOM G7 SENSOR) MISC 1 Device by Does not apply route as directed. 9 each 3   Eptinezumab-jjmr (VYEPTI) 100 MG/ML injection Inject 300 mg into the vein every 3 (three) months.     famotidine  (PEPCID ) 10 MG tablet Take 10 mg by mouth 2 (two) times daily.     ferrous  gluconate (FERGON) 324 MG tablet TAKE 1 TABLET (324 MG TOTAL) BY MOUTH DAILY WITH BREAKFAST. 90 tablet 1   gabapentin  (NEURONTIN ) 100 MG capsule TAKE 1 CAPSULE BY MOUTH UP TO 3 TIMES DAILY AS NEEDED. 270 capsule 3   gabapentin  (NEURONTIN ) 300 MG capsule Take 2 capsules (600 mg total) by mouth at bedtime. 180 capsule 1   insulin  glargine (LANTUS  SOLOSTAR) 100 UNIT/ML Solostar Pen Inject 15 Units into the skin daily.     Insulin  Pen Needle 32G X 4 MM MISC 1 Device by Does not apply route daily in the afternoon. 100 each 3   Lancets (ONETOUCH DELICA PLUS LANCET30G) MISC USE TO CHECK BLOOD SUGAR TWO TIMES DAILY 200 each 3   meclizine  (ANTIVERT ) 25 MG tablet Take 1 tablet (25 mg total) by mouth 3 (three) times daily as needed for dizziness. 30 tablet 1    ondansetron  (ZOFRAN ) 8 MG tablet Take 1 tablet (8 mg total) by mouth every 8 (eight) hours as needed for nausea or vomiting. 30 tablet 1   ONETOUCH VERIO test strip USE TO CHECK BLOOD SUGAR TWO TIMES A DAY 200 strip 3   pantoprazole  (PROTONIX ) 40 MG tablet Take 1 tablet (40 mg total) by mouth daily. 30 tablet 3   potassium chloride  SA (KLOR-CON  M) 20 MEQ tablet TAKE 1 TABLET BY MOUTH DAILY. 90 tablet 3   repaglinide  (PRANDIN ) 1 MG tablet Take 1 tablet (1 mg total) by mouth daily before breakfast AND 2 tablets (2 mg total) daily before lunch AND 2 tablets (2 mg total) daily before supper. 450 tablet 2   Ubrogepant  (UBRELVY ) 100 MG TABS Take 1 tablet (100 mg total) by mouth as needed (May repeat dose after 2 hours if needed.  Maximum 2 tablets in 24 hours). 10 tablet 3   vitamin B-12 (CYANOCOBALAMIN ) 1000 MCG tablet Take 1,000 mcg by mouth daily with breakfast.     Vitamin D , Ergocalciferol , (DRISDOL ) 1.25 MG (50000 UNIT) CAPS capsule TAKE 1 CAPSULE BY MOUTH ONCE A WEEK. 12 capsule 3   Current Facility-Administered Medications  Medication Dose Route Frequency Provider Last Rate Last Admin   0.9 %  sodium chloride  infusion  500 mL Intravenous Continuous Mansouraty, Beckhem Isadore Jr., MD        Current Outpatient Medications:    aluminum-magnesium  hydroxide 200-200 MG/5ML suspension, Take by mouth every 6 (six) hours as needed for indigestion. As needed, Disp: , Rfl:    apixaban  (ELIQUIS ) 2.5 MG TABS tablet, TAKE 1 TABLET BY MOUTH 2 TIMES DAILY., Disp: 180 tablet, Rfl: 0   atorvastatin  (LIPITOR ) 80 MG tablet, TAKE 1 TABLET BY MOUTH DAILY, Disp: 90 tablet, Rfl: 3   Blood Glucose Monitoring Suppl (ONETOUCH VERIO REFLECT) w/Device KIT, TEST TWICE A DAY, Disp: 1 kit, Rfl: 0   budesonide  (ENTOCORT EC ) 3 MG 24 hr capsule, TAKE 2 CAPSULES BY MOUTH DAILY., Disp: 180 capsule, Rfl: 12   Colesevelam  HCl 3.75 g PACK, Take 1 packet (3.75 g total) by mouth daily., Disp: 30 each, Rfl: 11   Continuous Glucose Sensor  (DEXCOM G7 SENSOR) MISC, 1 Device by Does not apply route as directed., Disp: 9 each, Rfl: 3   Eptinezumab-jjmr (VYEPTI) 100 MG/ML injection, Inject 300 mg into the vein every 3 (three) months., Disp: , Rfl:    famotidine  (PEPCID ) 10 MG tablet, Take 10 mg by mouth 2 (two) times daily., Disp: , Rfl:    ferrous gluconate  (FERGON) 324 MG tablet, TAKE 1 TABLET (324 MG TOTAL) BY MOUTH DAILY WITH  BREAKFAST., Disp: 90 tablet, Rfl: 1   gabapentin  (NEURONTIN ) 100 MG capsule, TAKE 1 CAPSULE BY MOUTH UP TO 3 TIMES DAILY AS NEEDED., Disp: 270 capsule, Rfl: 3   gabapentin  (NEURONTIN ) 300 MG capsule, Take 2 capsules (600 mg total) by mouth at bedtime., Disp: 180 capsule, Rfl: 1   insulin  glargine (LANTUS  SOLOSTAR) 100 UNIT/ML Solostar Pen, Inject 15 Units into the skin daily., Disp: , Rfl:    Insulin  Pen Needle 32G X 4 MM MISC, 1 Device by Does not apply route daily in the afternoon., Disp: 100 each, Rfl: 3   Lancets (ONETOUCH DELICA PLUS LANCET30G) MISC, USE TO CHECK BLOOD SUGAR TWO TIMES DAILY, Disp: 200 each, Rfl: 3   meclizine  (ANTIVERT ) 25 MG tablet, Take 1 tablet (25 mg total) by mouth 3 (three) times daily as needed for dizziness., Disp: 30 tablet, Rfl: 1   ondansetron  (ZOFRAN ) 8 MG tablet, Take 1 tablet (8 mg total) by mouth every 8 (eight) hours as needed for nausea or vomiting., Disp: 30 tablet, Rfl: 1   ONETOUCH VERIO test strip, USE TO CHECK BLOOD SUGAR TWO TIMES A DAY, Disp: 200 strip, Rfl: 3   pantoprazole  (PROTONIX ) 40 MG tablet, Take 1 tablet (40 mg total) by mouth daily., Disp: 30 tablet, Rfl: 3   potassium chloride  SA (KLOR-CON  M) 20 MEQ tablet, TAKE 1 TABLET BY MOUTH DAILY., Disp: 90 tablet, Rfl: 3   repaglinide  (PRANDIN ) 1 MG tablet, Take 1 tablet (1 mg total) by mouth daily before breakfast AND 2 tablets (2 mg total) daily before lunch AND 2 tablets (2 mg total) daily before supper., Disp: 450 tablet, Rfl: 2   Ubrogepant  (UBRELVY ) 100 MG TABS, Take 1 tablet (100 mg total) by mouth as needed  (May repeat dose after 2 hours if needed.  Maximum 2 tablets in 24 hours)., Disp: 10 tablet, Rfl: 3   vitamin B-12 (CYANOCOBALAMIN ) 1000 MCG tablet, Take 1,000 mcg by mouth daily with breakfast., Disp: , Rfl:    Vitamin D , Ergocalciferol , (DRISDOL ) 1.25 MG (50000 UNIT) CAPS capsule, TAKE 1 CAPSULE BY MOUTH ONCE A WEEK., Disp: 12 capsule, Rfl: 3  Current Facility-Administered Medications:    0.9 %  sodium chloride  infusion, 500 mL, Intravenous, Continuous, Mansouraty, Albino Alu., MD Allergies  Allergen Reactions   Clindamycin/Lincomycin Anaphylaxis   Sudafed [Pseudoephedrine Hcl] Hives, Itching and Anxiety   Atenolol  Other (See Comments)    Migraine   Codeine Itching and Swelling    REACTION: Migraine   Epinephrine  Itching   Hydrocodone Other (See Comments)    Migraine   Ibuprofen Other (See Comments)    Migraine   Jardiance  [Empagliflozin ] Other (See Comments)    Caused uti Yeast infection   Metronidazole  Swelling    Mouth and lips   Oxycodone Other (See Comments)    Migraine   Penicillins Itching and Swelling    ** Tolerates cephalosporins Has patient had a PCN reaction causing immediate rash, facial/tongue/throat swelling, SOB or lightheadedness with hypotension: No Has patient had a PCN reaction causing severe rash involving mucus membranes or skin necrosis: No Has patient had a PCN reaction that required hospitalization: No Has patient had a PCN reaction occurring within the last 10 years: No     Promethazine  Other (See Comments)    migraine   Propranolol  Other (See Comments)    Migraine   Talwin [Pentazocine] Other (See Comments)    Swelling and itching    Tramadol Other (See Comments)    Migraine   Vancomycin  Swelling  Swelling of Lips and Mouth   Crestor [Rosuvastatin] Other (See Comments)    Muscle pain   Family History  Problem Relation Age of Onset   Bone cancer Mother    Hypertension Father    Mitral valve prolapse Father    Colon polyps Brother     Asthma Brother    Arthritis Brother    Nephrolithiasis Brother    Asthma Brother    COPD Brother    Breast cancer Maternal Aunt    Breast cancer Maternal Aunt    Colon cancer Neg Hx    Esophageal cancer Neg Hx    Inflammatory bowel disease Neg Hx    Liver disease Neg Hx    Pancreatic cancer Neg Hx    Rectal cancer Neg Hx    Stomach cancer Neg Hx    Social History   Socioeconomic History   Marital status: Married    Spouse name: Joanette Moynahan   Number of children: 0   Years of education: Not on file   Highest education level: Bachelor's degree (e.g., BA, AB, BS)  Occupational History   Occupation: retired Freight forwarder: retired  Tobacco Use   Smoking status: Former    Current packs/day: 0.00    Average packs/day: 1 pack/day for 13.0 years (13.0 ttl pk-yrs)    Types: Cigarettes    Start date: 06/07/1978    Quit date: 06/08/1991    Years since quitting: 32.3    Passive exposure: Never   Smokeless tobacco: Never  Vaping Use   Vaping status: Never Used  Substance and Sexual Activity   Alcohol use: No   Drug use: No   Sexual activity: Not on file  Other Topics Concern   Not on file  Social History Narrative   Regular exercise--yes, recumbent bike 3-4 days a week      Diet: fruits and veggies, water, eats at home, drinks a lot of milk      Patient is right-handed. She drinks 1-2 cups of coffee a day.      One story home   Social Drivers of Health   Financial Resource Strain: Low Risk  (08/29/2023)   Overall Financial Resource Strain (CARDIA)    Difficulty of Paying Living Expenses: Not hard at all  Food Insecurity: No Food Insecurity (08/29/2023)   Hunger Vital Sign    Worried About Running Out of Food in the Last Year: Never true    Ran Out of Food in the Last Year: Never true  Transportation Needs: No Transportation Needs (08/29/2023)   PRAPARE - Administrator, Civil Service (Medical): No    Lack of Transportation (Non-Medical): No  Physical  Activity: Insufficiently Active (08/29/2023)   Exercise Vital Sign    Days of Exercise per Week: 3 days    Minutes of Exercise per Session: 30 min  Stress: No Stress Concern Present (08/29/2023)   [employer] of Occupational Health - Occupational Stress Questionnaire    Feeling of Stress : Not at all  Social Connections: Moderately Isolated (08/29/2023)   Social Connection and Isolation Panel [NHANES]    Frequency of Communication with Friends and Family: Three times a week    Frequency of Social Gatherings with Friends and Family: Twice a week    Attends Religious Services: Never    Database administrator or Organizations: No    Attends Banker Meetings: Never    Marital Status: Married  Catering manager Violence: Not At Risk (  08/02/2023)   Humiliation, Afraid, Rape, and Kick questionnaire    Fear of Current or Ex-Partner: No    Emotionally Abused: No    Physically Abused: No    Sexually Abused: No    Physical Exam: Today's Vitals   10/12/23 0756  BP: (!) 198/99  Pulse: 81  Temp: 98.1 F (36.7 C)  SpO2: 98%  Weight: 129 lb (58.5 kg)  Height: 4\' 11"  (1.499 m)   Body mass index is 26.05 kg/m. GEN: NAD EYE: Sclerae anicteric ENT: MMM CV: Non-tachycardic GI: Soft, NT/ND NEURO:  Alert & Oriented x 3  Lab Results: No results for input(s): "WBC", "HGB", "HCT", "PLT" in the last 72 hours. BMET No results for input(s): "NA", "K", "CL", "CO2", "GLUCOSE", "BUN", "CREATININE", "CALCIUM " in the last 72 hours. LFT No results for input(s): "PROT", "ALBUMIN", "AST", "ALT", "ALKPHOS", "BILITOT", "BILIDIR", "IBILI" in the last 72 hours. PT/INR No results for input(s): "LABPROT", "INR" in the last 72 hours.   Impression / Plan: This is a 73 y.o.female who presents for EGD/Colonoscopy for dysphagia and change in bowel habits with underlying EPI and elevated fecal calprotectin.  The risks and benefits of endoscopic evaluation/treatment were discussed with the  patient and/or family; these include but are not limited to the risk of perforation, infection, bleeding, missed lesions, lack of diagnosis, severe illness requiring hospitalization, as well as anesthesia and sedation related illnesses.  The patient's history has been reviewed, patient examined, no change in status, and deemed stable for procedure.  The patient and/or family is agreeable to proceed.    Yong Henle, MD [facility] Office # [phone]

## 2023-10-12 NOTE — Progress Notes (Signed)
 Called to room to assist during endoscopic procedure.  Patient ID and intended procedure confirmed with present staff. Received instructions for my participation in the procedure from the performing physician.

## 2023-10-12 NOTE — Op Note (Addendum)
 [facility] Patient Name: Debra Leach Procedure Date: 10/12/2023 8:43 AM MRN: [id number] Endoscopist: Yong Henle , MD, [id number] Age: 73 Referring MD:  Date of Birth: 1950-07-08 Gender: Female Account #: [id number] Procedure:                Colonoscopy Indications:              Incidental change in bowel habits noted, Incidental                            diarrhea noted Medicines:                Monitored Anesthesia Care Procedure:                Pre-Anesthesia Assessment:                           - Prior to the procedure, a History and Physical                            was performed, and patient medications and                            allergies were reviewed. The patient's tolerance of                            previous anesthesia was also reviewed. The risks                            and benefits of the procedure and the sedation                            options and risks were discussed with the patient.                            All questions were answered, and informed consent                            was obtained. Prior Anticoagulants: The patient has                            taken Eliquis  (apixaban ), last dose was 2 days                            prior to procedure. ASA Grade Assessment: II - A                            patient with mild systemic disease. After reviewing                            the risks and benefits, the patient was deemed in                            satisfactory condition to undergo the procedure.  After obtaining informed consent, the colonoscope                            was passed under direct vision. Throughout the                            procedure, the patient's blood pressure, pulse, and                            oxygen saturations were monitored continuously. The                            Olympus Scope SN: [id number] was introduced through                            the anus and  advanced to the 3 cm into the ileum.                            The colonoscopy was performed without difficulty.                            The patient tolerated the procedure. The quality of                            the bowel preparation was adequate. The terminal                            ileum, ileocecal valve, appendiceal orifice, and                            rectum were photographed. Scope In: 9:01:46 AM Scope Out: 9:22:57 AM Scope Withdrawal Time: 0 hours 18 minutes 50 seconds  Total Procedure Duration: 0 hours 21 minutes 11 seconds  Findings:                 The digital rectal exam findings include                            hemorrhoids. Pertinent negatives include no                            palpable rectal lesions.                           The terminal ileum and ileocecal valve appeared                            normal.                           Five sessile polyps were found in the descending                            colon (2), transverse colon (2) and ascending  colon. The polyps were 3 to 10 mm in size. These                            polyps were removed with a cold snare. Resection                            and retrieval were complete.                           Normal mucosa was found in the colon. Biopsies for                            histology were taken with a cold forceps from the                            right colon and left colon for evaluation of                            microscopic colitis.                           Diffuse granular mucosa was found in the rectum.                            Biopsies were taken with a cold forceps for                            histology.                           Non-bleeding non-thrombosed external and internal                            hemorrhoids were found during retroflexion, during                            perianal exam and during digital exam. The                             hemorrhoids were Grade II (internal hemorrhoids                            that prolapse but reduce spontaneously). Complications:            No immediate complications. Estimated Blood Loss:     Estimated blood loss was minimal. Impression:               - Hemorrhoids found on digital rectal exam.                           - The examined portion of the ileum was normal.                           - Five, 3 to 10 mm polyps in the descending colon,  in the transverse colon and in the ascending colon,                            removed with a cold snare. Resected and retrieved.                           - Normal mucosa in the entire examined colon.                            Biopsied.                           - Granularity in the rectum. Biopsied.                           - Non-bleeding non-thrombosed external and internal                            hemorrhoids. Recommendation:           - The patient will be observed post-procedure,                            until all discharge criteria are met.                           - Discharge patient to home.                           - Patient has a contact number available for                            emergencies. The signs and symptoms of potential                            delayed complications were discussed with the                            patient. Return to normal activities tomorrow.                            Written discharge instructions were provided to the                            patient.                           - High fiber diet.                           - Use FiberCon 1-2 tablets PO daily.                           - May restart Eliquis  on 5/9 AM.                           - Continue  present medications.                           - Await pathology results.                           - Repeat colonoscopy in likely 3 years for                            surveillance based on pathology. If evidence  of                            chronic inflammation of the colon is noted, may                            need to consider earlier follow-up colonoscopy.                           - The findings and recommendations were discussed                            with the patient.                           - The findings and recommendations were discussed                            with the patient's family. Yong Henle, MD 10/12/2023 9:34:13 AM

## 2023-10-12 NOTE — Patient Instructions (Addendum)
 YOU HAD AN ENDOSCOPIC PROCEDURE TODAY AT THE [facility]:   Refer to the procedure report that was given to you for any specific questions about what was found during the examination.  If the procedure report does not answer your questions, please call your gastroenterologist to clarify.  If you requested that your care partner not be given the details of your procedure findings, then the procedure report has been included in a sealed envelope for you to review at your convenience later.  YOU SHOULD EXPECT: Some feelings of bloating in the abdomen. Passage of more gas than usual.  Walking can help get rid of the air that was put into your GI tract during the procedure and reduce the bloating. If you had a lower endoscopy (such as a colonoscopy or flexible sigmoidoscopy) you may notice spotting of blood in your stool or on the toilet paper. If you underwent a bowel prep for your procedure, you may not have a normal bowel movement for a few days.  Please Note:  You might notice some irritation and congestion in your nose or some drainage.  This is from the oxygen used during your procedure.  There is no need for concern and it should clear up in a day or so.  SYMPTOMS TO REPORT IMMEDIATELY:  Following lower endoscopy (colonoscopy or flexible sigmoidoscopy):  Excessive amounts of blood in the stool  Significant tenderness or worsening of abdominal pains  Swelling of the abdomen that is new, acute  Fever of 100F or higher  Following upper endoscopy (EGD)  Vomiting of blood or coffee ground material  New chest pain or pain under the shoulder blades  Painful or persistently difficult swallowing  New shortness of breath  Fever of 100F or higher  Black, tarry-looking stools  For urgent or emergent issues, a gastroenterologist can be reached at any hour by calling [phone]. Do not use MyChart messaging for urgent concerns.    DIET:  Follow a Post-Dilation Diet (see handout):  NOTHING BY MOUTH until 1030, then you may have CLEAR LIQUIDS ONLY for one hour from 1030 to 1130, you may then progress to a SOFT DIET from 1130 to the rest of the day today. You may proceed to your regular diet as tolerated tomorrow morning.  Drink plenty of fluids but you should avoid alcoholic beverages for 24 hours. Use a High Fiber diet.  MEDICATIONS: Continue present medications including your PPI daily. Resume Eliquis  in 2 days (Friday). Use FiberCon 1-2 tablets daily. Please use Cepacol or Halls Lozenges as well as Chloraseptic spray for the next 72-96 hours to aid in sore throat should you experience a sore throat.  FOLLOW UP: Await pathology results. If patient continues to experience difficulty swallowing (dysphagia) symptoms without significant time between issues, then we will consider esophageal manometry. If you do well for a while, we could consider a repeat dilation up to 19 or 20 mm since we did cause a mucosal wrent at 19mm. If patient is found to have dysplastic tissue, will need a one year follow-up endoscopy. Repeat colonoscopy in likely 3 years for surveillance based on pathology. If evidence of chronic inflammation of the colon is noted, we may need to consider an earlier follow-up colonoscopy.  Please see handouts given to you by your recovery nurse: Hiatal Hernia, Polyps, esophageal stricture, Post-Dilation diet, hemorrhoids.  Thank you for allowing us  to provide for your healthcare needs today.  ACTIVITY:  You should plan to take it easy for the  rest of today and you should NOT DRIVE or use heavy machinery until tomorrow (because of the sedation medicines used during the test).    FOLLOW UP: Our staff will call the number listed on your records the next business day following your procedure.  We will call around 7:15- 8:00 am to check on you and address any questions or concerns that you may have regarding the information given to you following your procedure. If we do not  reach you, we will leave a message.     If any biopsies were taken you will be contacted by phone or by letter within the next 1-3 weeks.  Please call us  at [phone] if you have not heard about the biopsies in 3 weeks.    SIGNATURES/CONFIDENTIALITY: You and/or your care partner have signed paperwork which will be entered into your electronic medical record.  These signatures attest to the fact that that the information above on your After Visit Summary has been reviewed and is understood.  Full responsibility of the confidentiality of this discharge information lies with you and/or your care-partner.

## 2023-10-13 ENCOUNTER — Telehealth: Payer: Self-pay

## 2023-10-13 NOTE — Telephone Encounter (Signed)
  Follow up Call-     10/12/2023    8:10 AM 02/11/2023    7:27 AM  Call back number  Post procedure Call Back phone  # [phone]  Permission to leave phone message Yes Yes     Patient questions:  Do you have a fever, pain , or abdominal swelling? No. Pain Score  0 *  Have you tolerated food without any problems? Yes.    Have you been able to return to your normal activities? Yes.    Do you have any questions about your discharge instructions: Diet   No. Medications  No. Follow up visit  No.  Do you have questions or concerns about your Care? No.  Actions: * If pain score is 4 or above: No action needed, pain <4.

## 2023-10-14 ENCOUNTER — Other Ambulatory Visit: Payer: Self-pay | Admitting: Neurology

## 2023-10-14 ENCOUNTER — Other Ambulatory Visit: Payer: Self-pay | Admitting: Family Medicine

## 2023-10-14 DIAGNOSIS — G43009 Migraine without aura, not intractable, without status migrainosus: Secondary | ICD-10-CM

## 2023-10-14 DIAGNOSIS — G43709 Chronic migraine without aura, not intractable, without status migrainosus: Secondary | ICD-10-CM

## 2023-10-14 DIAGNOSIS — G43109 Migraine with aura, not intractable, without status migrainosus: Secondary | ICD-10-CM

## 2023-10-18 ENCOUNTER — Ambulatory Visit: Payer: Self-pay | Admitting: Gastroenterology

## 2023-10-18 LAB — SURGICAL PATHOLOGY

## 2023-10-19 ENCOUNTER — Encounter: Payer: Self-pay | Admitting: Gastroenterology

## 2023-10-20 ENCOUNTER — Telehealth: Payer: Self-pay

## 2023-10-20 NOTE — Telephone Encounter (Signed)
 [facility] update: Patient tolerated well Vypeti 300 mg.   Next appt 01/18/24

## 2023-10-24 ENCOUNTER — Encounter: Payer: Self-pay | Admitting: Neurology

## 2023-10-28 ENCOUNTER — Other Ambulatory Visit: Payer: Self-pay | Admitting: Neurology

## 2023-10-28 MED ORDER — ZONISAMIDE 25 MG PO CAPS: ORAL_CAPSULE | ORAL | 0 refills | Status: DC

## 2023-11-01 ENCOUNTER — Encounter: Payer: Self-pay | Admitting: Internal Medicine

## 2023-11-07 ENCOUNTER — Ambulatory Visit: Admitting: Physician Assistant

## 2023-11-11 ENCOUNTER — Other Ambulatory Visit: Payer: Self-pay | Admitting: Family Medicine

## 2023-11-11 DIAGNOSIS — E119 Type 2 diabetes mellitus without complications: Secondary | ICD-10-CM

## 2023-11-15 ENCOUNTER — Encounter: Payer: Self-pay | Admitting: Family Medicine

## 2023-11-15 ENCOUNTER — Ambulatory Visit (INDEPENDENT_AMBULATORY_CARE_PROVIDER_SITE_OTHER): Admitting: Family Medicine

## 2023-11-15 VITALS — BP 140/80 | HR 61 | Temp 97.3°F | Ht 59.65 in | Wt 128.5 lb

## 2023-11-15 DIAGNOSIS — R519 Headache, unspecified: Secondary | ICD-10-CM

## 2023-11-15 NOTE — Assessment & Plan Note (Signed)
 Acute, significantly improved with improving migraine now on zonegran . NO current sign of infection, bacteria.  Continue nasocort 2 sprays per nostril.

## 2023-11-15 NOTE — Progress Notes (Signed)
 Patient ID: Debra Leach, female    DOB: July 19, 1950, 73 y.o.   MRN: [id number]  This visit was conducted in person.  BP (!) 140/80   Pulse 61   Temp (!) 97.3 F (36.3 C) (Temporal)   Ht 4' 11.65" (1.515 m)   Wt 128 lb 8 oz (58.3 kg)   SpO2 97%   BMI 25.39 kg/m    CC:  Chief Complaint  Patient presents with   Eye Pain    Shooting pain in Left Eye about 5 to 6 weeks ago.  Went to [facility] Dr. Everything checked out okay   Facial Pain   Migraine    On Infusions for them-Has appointment with Neurologist on 06/25    Subjective:   HPI: Debra Leach is a 73 y.o. female presenting on 11/15/2023 for Eye Pain (Shooting pain in Left Eye about 5 to 6 weeks ago.  Went to [facility] Dr. Everything checked out okay), Facial Pain, and Migraine (On Infusions for them-Has appointment with Neurologist on 06/25)   New onset facial pain.. frontal , behind eyes, and under eyes... ongoing for 4 weeks.  Clear nasal discharge, no fever.  Started Nasacort.. helped some   Has also noted low glucose measurements x several month. Last week 41, most in 50s. Not eating as well as previously was.  Likely due to weight loss. She is trying to eat better. Keeping up with water. Wt Readings from Last 3 Encounters:  11/15/23 128 lb 8 oz (58.3 kg)  10/12/23 129 lb (58.5 kg)  10/04/23 130 lb 8 oz (59.2 kg)   Has been reducing Lantus  dose overtime.   Of note shooting left eye pain 5 to 6 weeks ago had visit with ophthalmology which was within normal limits. Chronic migraines followed with neurology on Vyepti  infusions.  Not as effective at last dose  as previously.  She has been haivng 8-9 pain level, lasting longer 24 hours.Next appointment  with Dr. Dotty Leach November 30, 2023.   Recent start of Zonegran .. in week three of start. titrating up.      Relevant past medical, surgical, family and social history reviewed and updated as indicated. Interim medical history since our last visit  reviewed. Allergies and medications reviewed and updated. Outpatient Medications Prior to Visit  Medication Sig Dispense Refill   aluminum-magnesium  hydroxide 200-200 MG/5ML suspension Take by mouth every 6 (six) hours as needed for indigestion. As needed     apixaban  (ELIQUIS ) 2.5 MG TABS tablet TAKE 1 TABLET BY MOUTH 2 TIMES DAILY. 180 tablet 3   atorvastatin  (LIPITOR ) 80 MG tablet TAKE 1 TABLET BY MOUTH DAILY 90 tablet 3   Blood Glucose Monitoring Suppl (ONETOUCH VERIO REFLECT) w/Device KIT TEST TWICE A DAY 1 kit 0   budesonide  (ENTOCORT EC ) 3 MG 24 hr capsule TAKE 2 CAPSULES BY MOUTH DAILY. 180 capsule 12   Colesevelam  HCl 3.75 g PACK Take 1 packet (3.75 g total) by mouth daily. 30 each 11   Continuous Glucose Sensor (DEXCOM G7 SENSOR) MISC 1 Device by Does not apply route as directed. 9 each 3   Eptinezumab-jjmr (VYEPTI) 100 MG/ML injection Inject 300 mg into the vein every 3 (three) months.     famotidine  (PEPCID ) 10 MG tablet Take 10 mg by mouth 2 (two) times daily.     ferrous gluconate  (FERGON) 324 MG tablet TAKE 1 TABLET (324 MG TOTAL) BY MOUTH DAILY WITH BREAKFAST. 90 tablet 1   gabapentin  (NEURONTIN ) 100 MG capsule  TAKE 1 CAPSULE BY MOUTH UP TO 3 TIMES DAILY AS NEEDED. 270 capsule 3   gabapentin  (NEURONTIN ) 300 MG capsule TAKE 2 CAPSULES (600 MG TOTAL) BY MOUTH AT BEDTIME. 180 capsule 1   insulin  glargine (LANTUS  SOLOSTAR) 100 UNIT/ML Solostar Pen Inject 12 Units into the skin daily.     Insulin  Pen Needle 32G X 4 MM MISC 1 Device by Does not apply route daily in the afternoon. 100 each 3   Lancets (ONETOUCH DELICA PLUS LANCET30G) MISC USE TO CHECK BLOOD SUGAR TWO TIMES DAILY 200 each 3   meclizine  (ANTIVERT ) 25 MG tablet Take 1 tablet (25 mg total) by mouth 3 (three) times daily as needed for dizziness. 30 tablet 1   ondansetron  (ZOFRAN ) 8 MG tablet Take 1 tablet (8 mg total) by mouth every 8 (eight) hours as needed for nausea or vomiting. 30 tablet 1   ONETOUCH VERIO test strip USE  TO CHECK BLOOD SUGAR TWO TIMES A DAY 200 strip 3   pantoprazole  (PROTONIX ) 40 MG tablet Take 1 tablet (40 mg total) by mouth daily. 30 tablet 3   potassium chloride  SA (KLOR-CON  M) 20 MEQ tablet TAKE 1 TABLET BY MOUTH DAILY. 90 tablet 3   repaglinide  (PRANDIN ) 1 MG tablet Take 1 tablet (1 mg total) by mouth daily before breakfast AND 2 tablets (2 mg total) daily before lunch AND 4 tablets (4 mg total) daily before supper.     triamcinolone (NASACORT ALLERGY 24HR) 55 MCG/ACT AERO nasal inhaler Place 2 sprays into the nose daily.     Ubrogepant  (UBRELVY ) 100 MG TABS Take 1 tablet (100 mg total) by mouth as needed (May repeat dose after 2 hours if needed.  Maximum 2 tablets in 24 hours). 10 tablet 3   vitamin B-12 (CYANOCOBALAMIN ) 1000 MCG tablet Take 1,000 mcg by mouth daily with breakfast.     Vitamin D , Ergocalciferol , (DRISDOL ) 1.25 MG (50000 UNIT) CAPS capsule TAKE 1 CAPSULE BY MOUTH ONCE A WEEK. 12 capsule 3   zonisamide  (ZONEGRAN ) 25 MG capsule Take 1 capsule daily for one week, then 2 capsules daily for one week, then 3 capsules daily for one week, then 4 capsules daily 120 capsule 0   repaglinide  (PRANDIN ) 1 MG tablet Take 1 tablet (1 mg total) by mouth daily before breakfast AND 2 tablets (2 mg total) daily before lunch AND 2 tablets (2 mg total) daily before supper. (Patient taking differently: Take 1 tablet (1 mg total) by mouth daily before breakfast AND 2 tablets (2 mg total) daily before lunch AND 4 tablets (4 mg total) daily before supper.) 450 tablet 2   No facility-administered medications prior to visit.     Per HPI unless specifically indicated in ROS section below Review of Systems  Constitutional:  Negative for fatigue and fever.  HENT:  Negative for congestion.   Eyes:  Negative for pain.  Respiratory:  Negative for cough and shortness of breath.   Cardiovascular:  Negative for chest pain, palpitations and leg swelling.  Gastrointestinal:  Negative for abdominal pain.   Genitourinary:  Negative for dysuria and vaginal bleeding.  Musculoskeletal:  Negative for back pain.  Neurological:  Negative for syncope, light-headedness and headaches.  Psychiatric/Behavioral:  Negative for dysphoric mood.    Objective:  BP (!) 140/80   Pulse 61   Temp (!) 97.3 F (36.3 C) (Temporal)   Ht 4' 11.65" (1.515 m)   Wt 128 lb 8 oz (58.3 kg)   SpO2 97%   BMI 25.39  kg/m   Wt Readings from Last 3 Encounters:  11/15/23 128 lb 8 oz (58.3 kg)  10/12/23 129 lb (58.5 kg)  10/04/23 130 lb 8 oz (59.2 kg)      Physical Exam Constitutional:      General: She is not in acute distress.    Appearance: Normal appearance. She is well-developed. She is not ill-appearing or toxic-appearing.  HENT:     Head: Normocephalic.     Right Ear: Hearing, tympanic membrane, ear canal and external ear normal. Tympanic membrane is not erythematous, retracted or bulging.     Left Ear: Hearing, tympanic membrane, ear canal and external ear normal. Tympanic membrane is not erythematous, retracted or bulging.     Nose: No mucosal edema or rhinorrhea.     Right Sinus: No maxillary sinus tenderness or frontal sinus tenderness.     Left Sinus: No maxillary sinus tenderness or frontal sinus tenderness.     Mouth/Throat:     Pharynx: Uvula midline.  Eyes:     General: Lids are normal. Lids are everted, no foreign bodies appreciated.     Conjunctiva/sclera: Conjunctivae normal.     Pupils: Pupils are equal, round, and reactive to light.  Neck:     Thyroid : No thyroid  mass or thyromegaly.     Vascular: No carotid bruit.     Trachea: Trachea normal.  Cardiovascular:     Rate and Rhythm: Normal rate and regular rhythm.     Pulses: Normal pulses.     Heart sounds: Normal heart sounds, S1 normal and S2 normal. No murmur heard.    No friction rub. No gallop.  Pulmonary:     Effort: Pulmonary effort is normal. No tachypnea or respiratory distress.     Breath sounds: Normal breath sounds. No  decreased breath sounds, wheezing, rhonchi or rales.  Abdominal:     General: Bowel sounds are normal.     Palpations: Abdomen is soft.     Tenderness: There is no abdominal tenderness.  Musculoskeletal:     Cervical back: Normal range of motion and neck supple.  Skin:    General: Skin is warm and dry.     Findings: No rash.  Neurological:     Mental Status: She is alert.  Psychiatric:        Mood and Affect: Mood is not anxious or depressed.        Speech: Speech normal.        Behavior: Behavior normal. Behavior is cooperative.        Thought Content: Thought content normal.        Judgment: Judgment normal.       Results for orders placed or performed in visit on 10/12/23  Surgical pathology ([facility])   Collection Time: 10/12/23 12:00 AM  Result Value Ref Range   SURGICAL PATHOLOGY      SURGICAL PATHOLOGY [facility] [address] [place], [place] [address] Telephone [phone] or [phone] Fax [phone]  REPORT OF SURGICAL PATHOLOGY   Accession #: [id number] Patient Name: TIANDRA, SWOVELAND Visit # : [id number]  MRN: [id number] Physician: Yong Henle DOB/Age February 28, 1951 (Age: 28) Gender: F Collected Date: 10/12/2023 Received Date: 10/13/2023  FINAL DIAGNOSIS       1. Surgical [P], gastric random :       GASTRIC ANTRAL AND OXYNTIC MUCOSA WITH HYPEREMIA.      NEGATIVE FOR HELICOBACTER PYLORI.       2. Surgical [P], gastric antrum polyp  x 1, polyp (1) :       HYPERPLASTIC GASTRIC POLYP.       3. Surgical [P], random sites colon :       COLONIC MUCOSA WITH NO SIGNIFICANT PATHOLOGIC CHANGES.      NO MICROSCOPIC COLITIS, ACTIVE INFLAMMATION OR GRANULOMAS.       4. Surgical [P], colon, ascending x 1, transverse x 2, descending x 2, polyp (5) :       SESSILE SERRATED POLYP (1) WITHOUT CYTOLOGIC DYSPLA SIA.      COLONIC MUCOSA WITH BENIGN LYMPHOID AGGREGATES.      MULTIPLE ADDITIONAL LEVELS EXAMINED.       5.  Surgical [P], colon, rectum :       COLONIC MUCOSA WITH NO SIGNIFICANT PATHOLOGIC CHANGES.      NO MICROSCOPIC COLITIS, ACTIVE INFLAMMATION OR GRANULOMAS.       ELECTRONIC SIGNATURE : Earleen Glazier, John, Pathologist, Electronic Signature  MICROSCOPIC DESCRIPTION  CASE COMMENTS STAINS USED IN DIAGNOSIS: H&E H&E H&E H&E-2 H&E H&E-2 *RECUT DEEPER X 9 LEVELS *RECUT DEEPER X 9 LEVELS *RECUT DEEPER X 9 LEVELS *RECUT DEEPER X 9 LEVELS H&E H&E-2    CLINICAL HISTORY  SPECIMEN(S) OBTAINED 1. Surgical [P], Gastric Random 2. Surgical [P], Gastric Antrum Polyp X 1, Polyp (1) 3. Surgical [P], Random Sites Colon 4. Surgical [P], Colon, Ascending X 1, Transverse X 2, Descending X 2, Polyp (5) 5. Surgical [P], Colon, Rectum  SPECIMEN COMMENTS: 1. 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HCC); esophageal dysphagia; history of clostridioides difficile infection; IGG4-rel ated sclerosing disease (HCC); esophageal stricture; irregular Z line of esophagus; gastric polyp; benign neoplasm of ascending colon; change in bowel habits; benign neoplasm of transverse colon; benign neoplasm of descending colon SPECIMEN CLINICAL INFORMATION: 1. R/O H.pylori 2. R/O adenoma 3. R/O microscopic colitis 4. R/O adenoma 5. R/O other chronic proctitis    Gross Description 1. Received in formalin are tan, soft tissue fragments that are submitted in toto.Number: multiple, Size: 0.2 cm smallest to 0.6 cm largest, (1B) ( TA ) 2. Received in formalin are tan, soft tissue fragments that are submitted in toto.Number: 1 Size: 0.3 cm, (1B) ( TA ) 3. Received in formalin are tan, soft tissue fragments that are submitted in toto.Number: multiple, Size: 0.2 cm smallest to 0.5 cm largest, (1B) ( TA ) 4. Received in formalin are tan, soft tissue fragments that are submitted in toto.Number: 5, Size: 0.2 cm smallest to 1.3 cm largest, (1B) ( TA ) 5. Received in forma lin are tan, soft tissue fragments that are submitted  in toto.Number: multiple, Size: 0.2 cm smallest to 0.4 cm largest, (1B) ( TA )        Report signed out from the following location(s) [place] [address] [place], [place] [address] CLIA #: [id number]  [facility] [address] [place], [place] [address] CLIA #: [id number]    *Note: Due to a large number of results and/or encounters for the requested time period, some results have not been displayed. A complete set of results can be found in Results Review.    Assessment and Plan  Facial pain Assessment & Plan:  Acute, significantly improved with improving migraine now on zonegran . NO current sign of infection, bacteria.  Continue nasocort 2 sprays per nostril.      No follow-ups on file.   Herby Lolling, MD

## 2023-11-18 ENCOUNTER — Encounter: Payer: Self-pay | Admitting: Family Medicine

## 2023-11-20 ENCOUNTER — Encounter: Payer: Self-pay | Admitting: Gastroenterology

## 2023-11-21 ENCOUNTER — Other Ambulatory Visit: Payer: Self-pay | Admitting: Family

## 2023-11-21 ENCOUNTER — Encounter: Payer: Self-pay | Admitting: Family Medicine

## 2023-11-21 MED ORDER — PREDNISONE 20 MG PO TABS: 20.0000 mg | ORAL_TABLET | Freq: Every day | ORAL | 0 refills | Status: DC

## 2023-11-23 ENCOUNTER — Encounter: Payer: Self-pay | Admitting: Neurology

## 2023-11-24 ENCOUNTER — Encounter: Payer: Self-pay | Admitting: Internal Medicine

## 2023-11-25 MED ORDER — INSULIN PEN NEEDLE 32G X 4 MM MISC: 1.0000 | Freq: Four times a day (QID) | 3 refills | Status: DC

## 2023-11-25 MED ORDER — NOVOLOG FLEXPEN 100 UNIT/ML ~~LOC~~ SOPN: 6.0000 [IU] | PEN_INJECTOR | Freq: Three times a day (TID) | SUBCUTANEOUS | 2 refills | Status: DC

## 2023-11-29 NOTE — Progress Notes (Unsigned)
 In   NEUROLOGY FOLLOW UP OFFICE NOTE  Debra Leach [id number]  Assessment/Plan:   Episodic vertigo, possibly migrainous  Migraine without aura, without status migrainosus, not intractable - increased frequency - suspect secondary to weather (frequent storms and rain) Ocular migraine Elevated blood pressure - likely secondary to migraine     Migraine prevention:  Vyepti 300mg  Q12wks.   Zonisamide  100mg  daily.  If no improvement in 2 weeks, will increase to 200mg  daily.  If no improvement in another 4 weeks, will increase to 300mg  daily.  If no improvement by follow up, plan to add Botox  to Vyepti. Migraine rescue:  Ubrelvy  100mg ; gabapentin  100mg  for less severe.   Limit use of pain relievers to no more than 9 days out of the month to prevent risk of rebound or medication-overuse headache. Keep headache diary Follow up with PCP regarding BP Follow up 3 months.     Subjective:  Debra Leach is a 73 year old right-handed female with autoimmune pancreatitis, migraines, hyperlipidemia, diabetes, GERD, osteoarthritis, and history of MVP and renal stones who follows up for dizziness.     UPDATE: Migraines: Due to ongoing worsening migraines, she was started on zonisamide  last month.   She started it at the same time as starting prednisone  for a sinus infection.  Just finished the prednisone  yesterday, so unsure if it is helping. Duration:  1 to 2 hours starts to improve with Ubrelvy .  She tried samples of Nurtec for acute treatment.  It works faster (no moretthan 1 hour) than Ubrelvy  but doesn't last, as migraines return after 4 hours.  She prefers Ubrelvy . Frequency:  Over the course of the last 85 days, she had only 3 migraine-free days.    She has also had 3 episodes of ocular migraine (vision loss/blurred vision) in May.   Rescue protocol:  For moderate - gabapentin  100mg , for severe- Ubrelvy  Current NSAIDS/steroid: budesomide Current analgesics: None Current triptans:  None Current ergotamine: None Current anti-emetic: Promethazine  12.5 mg Current muscle relaxants: None Current anti-anxiolytic: None Current sleep aide: None Current Antihypertensive medications: losartan -HCTZ Current Antidepressant medications: None Current Anticonvulsant medications: Gabapentin  600mg  at bedtime and 100mg  TID PRN Current anti-CGRP: Vyepti 300mg ; Ubrelvy  Current Vitamins/Herbal/Supplements: Riboflavin 400 mg daily, vitamin D  Current Antihistamines/Decongestants: meclizine  Other therapy: Cefaly, ice pack Other medication: Meclizine    Caffeine: 1 cup of coffee daily Alcohol: None Smoker: No Diet: Follows [employer] ImmunoCalm Dietary Program.  Hydrates. Exercise: When tolerated (knee problems).  She walks and bikes. Depression: No; Anxiety: No Other pain:  no Sleep hygiene: Improved.  Sleeps better now than every before. 100mg  during day as needed   HISTORY: Migraines: Onset: 73 years old Location:  Varies (unilateral either side, frontal-temporal, back of head) Quality:  Varies (stabbing, pounding, throbbing) Initial Intensity:  10/10 severe, otherwise 5-7/10 Aura:  Occurs off and on over the years and varies in semiology.  In early 1970s, black out.  In late 1980s, white out.  In late 1990s, flashing lights and zigzag lines.  Since 2016, scintillating scotoma (occurs 1 to 2 times a month) Prodrome:  no Associated symptoms: Initially vomiting.  Sometimes nausea.  Photophobia, phonophobia.  Dizziness, speech difficulty. Initial Duration:  All day Initial Frequency:  daily Triggers: Change in weather, emotional stress, valsalva maneuver, odors, certain foods Relieving factors: ice, rest Activity:  Difficult to function if severe  She has been to the ED for migraines with speech disturbance.  Workup in March 2024 included MRI of brain without contrast and CTA head and neck which  were unremarkable.     Past NSAIDS:  diclofenac  50mg , Cambia, Mobic  15mg , ibuprofen,  naproxen, toradol  Past analgesics:  tramadol (reaction), Midrin, Excedrin Past abortive triptans/ergots:  Treximet, Axert, Amerge, Frova, Maxalt, Relpax, Zomig tablet, DHE NS, sumatriptan  100mg ; sumatriptan  6mg  [place] Past anti-emetic:  Zofran  4mg  Past anxiolytic:  Buspirone, clonazepam Past antihypertensive medications:  Metoprolol , Norvasc, propranolol  ER 120mg , atenolol  100mg  (side effects dizziness, myalgias, acid reflux) Past antidepressant medications:  Venlafaxine, sertraline, amitriptyline, amoxapine, duloxetine, nortriptyline, imipramine, Luvox, Remeron, Serzone, bupropion, desipramine, doxepin, Vivactil Past anticonvulsant medications:  Depakote, Topamax, zonisamide , Lamictal, Keppra, Lyrica, gabapentin  300/300/600  Past CGRP inhibitor: Aimovig , Emgality , Nurtec PRN (quick onset but doesn't last) Past vitamins/Herbal/Supplements:  butterbur, Mg, CoQ10 Other past treatments:  Botox , Cefaly Device, methylergonovine, acupuncture, biofeedback, Seroquel, Thorazine, methylsergide maleate   Family history of headache:  Maybe her mother's side.   Prior brain MRI normal (date unknown but report is mentioned in prior notes).   DIzziness: Began having dizziness in early 2022.  No preceding head injury or new medication.  Reports episodic dizziness described as a wave of lightheadedness or sensation of stepping off of a boat, but not spinning.  It occurs with any movement but also can occur spontaneously.  Typically lasts 15 seconds but occurs all day long.  It doesn't occur while laying in bed, such as turning over.  No headache but feels like her head is floating.  No visual disturbance.  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  It does not appear to correlate with a migraine flare.  She reports history of mild orthostatic hypotension when getting up too fast, but not experiencing that with this and reportedly  orthostatic vitals were negative when checked by her PCP.  Flonase didn't help, however meclizine  is effective.  She followed up with ENT.  Dix-Hallpike was negative.  VNG showed abnormal smooth pursuit, saccades and optokinetic testing - central pathology cannot be ruled out.  She has bilateral sensorineural hearing loss.  Hearing aids were recommended.  Underwent workup for possible central dizziness.  MRI and MRA of brain on 12/23/2020 were unremarkable.  Vestibular rehab effective.   Abnormal Movements: In December 2017, we started atenolol  for migraine prevention.  It worked well, but due to reported side effects of dizziness, acid reflux and joint and muscle pain, she was tapered off of it in early April.  Around that time, she developed shaking episodes or tremors associated with her migraines.  She followed up with her PCP, Dr. Avelina, on 10/08/16 and we had her discontinue the gabapentin .  She hasn't had any recurrent tremors, however she says she has gone up to 4 days at a time without an attack.  Since discontinuing the gabapentin , the intensity of her daily headaches have gotten worse.     I reviewed the video of her habitual attacks.  In the video, she exhibits flapping of her right hand, arrhythmic and without tremor or choreiform movement.  It is not an arrhythmic jerking, consistent with myoclonus.  It sometimes involves the left hand as well.  She denies stress and anxiety.  Episodic Right Monocular Vision Loss: In April 2023, she began having episodes of monocular dimming of vision in the right eye lasting 15 minutes.  No associated headache.  Different than her previous ocular migraines presenting as pixel vision.  Saw her ophthalmologist and eye exam was normal.  Sed rate on 5/12 was 102 and repeat  on 5/17 was 130.  She was hospitalized on 11/02/2021 for syncope in setting of AKI and flu.  She was placed on 5 day course of prednsione.  Sed rate on day of admission was 60.  Sed rate in May  2023 was over 130.  Carotid ultrasound on 6/15 was negative for hemodynamically significant stenosis.  Underwent temporal artery biopsy on 6/29 which was negative.  Repeat eye exam on 12/09/2021 was normal.  Possible ocular migraine?  PAST MEDICAL HISTORY: 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HCC)    Cataract    Chronic kidney disease    Clotting disorder (HCC)    Complication of anesthesia    difficult waking   Diabetes mellitus without complication (HCC)    Diet and exercise controlled   Diverticulosis    DVT (deep venous thrombosis) (HCC) 02/2019   3calf 2 right lung 1 left lung   DVT (deep venous thrombosis) (HCC) 2020   right leg   Dyspnea    GERD (gastroesophageal reflux disease)    Headache    Hepatic steatosis    Hiatal hernia    History of kidney stones    Hyperlipidemia    MVP (mitral valve prolapse)    history of   PONV (postoperative nausea and vomiting)    Pulmonary emboli (HCC) 03/01/2019   2 in the right; 1 in the left    MEDICATIONS: Current Outpatient Medications on File Prior to Visit  Medication Sig Dispense Refill   aluminum-magnesium  hydroxide 200-200 MG/5ML suspension Take by mouth every 6 (six) hours as needed for indigestion. As needed     apixaban  (ELIQUIS ) 2.5 MG TABS tablet TAKE 1 TABLET BY MOUTH 2 TIMES DAILY. 180 tablet 3   atorvastatin  (LIPITOR ) 80 MG tablet TAKE 1 TABLET BY MOUTH DAILY 90 tablet 3   Blood Glucose Monitoring Suppl (ONETOUCH VERIO REFLECT) w/Device KIT TEST TWICE A DAY 1 kit 0   budesonide  (ENTOCORT EC ) 3 MG 24 hr capsule TAKE 2 CAPSULES BY MOUTH DAILY. 180 capsule 12   Colesevelam  HCl 3.75 g PACK Take 1 packet (3.75 g total) by mouth daily. 30 each 11   Continuous Glucose Sensor (DEXCOM G7 SENSOR) MISC 1 Device by Does not apply route as directed. 9 each 3   Eptinezumab-jjmr (VYEPTI) 100 MG/ML injection Inject 300 mg into the  vein every 3 (three) months.     famotidine  (PEPCID ) 10 MG tablet Take 10 mg by mouth 2 (two) times daily.     ferrous gluconate  (FERGON) 324 MG tablet TAKE 1 TABLET (324 MG TOTAL) BY MOUTH DAILY WITH BREAKFAST. 90 tablet 1   gabapentin  (NEURONTIN ) 100 MG capsule TAKE 1 CAPSULE BY MOUTH UP TO 3 TIMES DAILY AS NEEDED. 270 capsule 3   gabapentin  (NEURONTIN ) 300 MG capsule TAKE 2 CAPSULES (600 MG TOTAL) BY MOUTH AT BEDTIME. 180 capsule 1   insulin  aspart (NOVOLOG  FLEXPEN) 100 UNIT/ML FlexPen Inject 6-16 Units into the skin 3 (three) times daily with meals. 30 mL 2   insulin  glargine (LANTUS  SOLOSTAR) 100 UNIT/ML Solostar Pen Inject 12 Units into the skin daily.     Insulin  Pen Needle 32G X 4 MM MISC 1 Device by Does not apply route in the morning, at noon, in the evening, and at bedtime. 400 each 3   Lancets (ONETOUCH DELICA PLUS LANCET30G) MISC USE TO CHECK BLOOD SUGAR TWO TIMES  DAILY 200 each 3   meclizine  (ANTIVERT ) 25 MG tablet Take 1 tablet (25 mg total) by mouth 3 (three) times daily as needed for dizziness. 30 tablet 1   ondansetron  (ZOFRAN ) 8 MG tablet Take 1 tablet (8 mg total) by mouth every 8 (eight) hours as needed for nausea or vomiting. 30 tablet 1   ONETOUCH VERIO test strip USE TO CHECK BLOOD SUGAR TWO TIMES A DAY 200 strip 3   pantoprazole  (PROTONIX ) 40 MG tablet Take 1 tablet (40 mg total) by mouth daily. 30 tablet 3   potassium chloride  SA (KLOR-CON  M) 20 MEQ tablet TAKE 1 TABLET BY MOUTH DAILY. 90 tablet 3   predniSONE  (DELTASONE ) 20 MG tablet Take 1 tablet (20 mg total) by mouth daily with breakfast. 7 tablet 0   repaglinide  (PRANDIN ) 1 MG tablet Take 1 tablet (1 mg total) by mouth daily before breakfast AND 2 tablets (2 mg total) daily before lunch AND 4 tablets (4 mg total) daily before supper.     triamcinolone (NASACORT ALLERGY 24HR) 55 MCG/ACT AERO nasal inhaler Place 2 sprays into the nose daily.     Ubrogepant  (UBRELVY ) 100 MG TABS Take 1 tablet (100 mg total) by mouth as  needed (May repeat dose after 2 hours if needed.  Maximum 2 tablets in 24 hours). 10 tablet 3   vitamin B-12 (CYANOCOBALAMIN ) 1000 MCG tablet Take 1,000 mcg by mouth daily with breakfast.     Vitamin D , Ergocalciferol , (DRISDOL ) 1.25 MG (50000 UNIT) CAPS capsule TAKE 1 CAPSULE BY MOUTH ONCE A WEEK. 12 capsule 3   zonisamide  (ZONEGRAN ) 25 MG capsule Take 1 capsule daily for one week, then 2 capsules daily for one week, then 3 capsules daily for one week, then 4 capsules daily 120 capsule 0   No current facility-administered medications on file prior to visit.    ALLERGIES: Allergies  Allergen Reactions   Clindamycin/Lincomycin Anaphylaxis   Atenolol  Other (See Comments)    Migraine   Codeine Itching and Swelling    REACTION: Migraine   Epinephrine  Itching   Hydrocodone Other (See Comments)    Migraine   Ibuprofen Other (See Comments)    Migraine   Jardiance  [Empagliflozin ] Other (See Comments)    Caused uti Yeast infection   Metronidazole  Swelling    Mouth and lips   Oxycodone Other (See Comments)    Migraine   Penicillins Itching and Swelling    ** Tolerates cephalosporins Has patient had a PCN reaction causing immediate rash, facial/tongue/throat swelling, SOB or lightheadedness with hypotension: No Has patient had a PCN reaction causing severe rash involving mucus membranes or skin necrosis: No Has patient had a PCN reaction that required hospitalization: No Has patient had a PCN reaction occurring within the last 10 years: No     Promethazine  Other (See Comments)    migraine   Propranolol  Other (See Comments)    Migraine   Sudafed [Pseudoephedrine Hcl] Hives, Itching and Anxiety   Talwin [Pentazocine] Other (See Comments)    Swelling and itching    Tramadol Other (See Comments)    Migraine   Vancomycin  Swelling    Swelling of Lips and Mouth   Crestor [Rosuvastatin] Other (See Comments)    Muscle pain    FAMILY HISTORY: Family History  Problem Relation Age of  Onset   Bone cancer Mother    Hypertension Father    Mitral valve prolapse Father    Colon polyps Brother    Asthma Brother    Arthritis  Brother    Nephrolithiasis Brother    Asthma Brother    COPD Brother    Breast cancer Maternal Aunt    Breast cancer Maternal Aunt    Colon cancer Neg Hx    Esophageal cancer Neg Hx    Inflammatory bowel disease Neg Hx    Liver disease Neg Hx    Pancreatic cancer Neg Hx    Rectal cancer Neg Hx    Stomach cancer Neg Hx       Objective:  Blood pressure (!) 178/77, pulse 82, height 5' (1.524 m), weight 129 lb 12.8 oz (58.9 kg), SpO2 97%. General: No acute distress.  Patient appears well-groomed.           Juliene Dunnings, DO  CC: Greig Ring, MD

## 2023-11-30 ENCOUNTER — Encounter: Payer: Self-pay | Admitting: Neurology

## 2023-11-30 ENCOUNTER — Ambulatory Visit (INDEPENDENT_AMBULATORY_CARE_PROVIDER_SITE_OTHER): Admitting: Neurology

## 2023-11-30 VITALS — BP 178/77 | HR 82 | Ht 60.0 in | Wt 129.8 lb

## 2023-11-30 DIAGNOSIS — G43009 Migraine without aura, not intractable, without status migrainosus: Secondary | ICD-10-CM

## 2023-11-30 DIAGNOSIS — R030 Elevated blood-pressure reading, without diagnosis of hypertension: Secondary | ICD-10-CM | POA: Diagnosis not present

## 2023-11-30 DIAGNOSIS — G43109 Migraine with aura, not intractable, without status migrainosus: Secondary | ICD-10-CM | POA: Diagnosis not present

## 2023-12-06 ENCOUNTER — Other Ambulatory Visit: Payer: Self-pay | Admitting: Neurology

## 2023-12-07 ENCOUNTER — Encounter: Payer: Self-pay | Admitting: Family Medicine

## 2023-12-07 ENCOUNTER — Encounter: Payer: Self-pay | Admitting: Neurology

## 2023-12-07 ENCOUNTER — Other Ambulatory Visit

## 2023-12-07 ENCOUNTER — Other Ambulatory Visit: Payer: Self-pay | Admitting: Neurology

## 2023-12-07 ENCOUNTER — Ambulatory Visit: Payer: Self-pay | Admitting: Family Medicine

## 2023-12-07 ENCOUNTER — Ambulatory Visit (INDEPENDENT_AMBULATORY_CARE_PROVIDER_SITE_OTHER): Admitting: Family Medicine

## 2023-12-07 VITALS — BP 154/82 | HR 82 | Temp 98.6°F | Ht 60.0 in | Wt 125.4 lb

## 2023-12-07 DIAGNOSIS — R197 Diarrhea, unspecified: Secondary | ICD-10-CM

## 2023-12-07 LAB — COMPREHENSIVE METABOLIC PANEL WITH GFR
ALT: 18 U/L (ref 0–35)
AST: 16 U/L (ref 0–37)
Albumin: 3.8 g/dL (ref 3.5–5.2)
Alkaline Phosphatase: 65 U/L (ref 39–117)
BUN: 10 mg/dL (ref 6–23)
CO2: 27 meq/L (ref 19–32)
Calcium: 9.3 mg/dL (ref 8.4–10.5)
Chloride: 108 meq/L (ref 96–112)
Creatinine, Ser: 0.99 mg/dL (ref 0.40–1.20)
GFR: 56.93 mL/min — ABNORMAL LOW (ref >60.00)
Glucose, Bld: 192 mg/dL — ABNORMAL HIGH (ref 70–99)
Potassium: 4.5 meq/L (ref 3.5–5.1)
Sodium: 143 meq/L (ref 135–145)
Total Bilirubin: 0.4 mg/dL (ref 0.2–1.2)
Total Protein: 6.8 g/dL (ref 6.0–8.3)

## 2023-12-07 MED ORDER — ZONISAMIDE 100 MG PO CAPS: 100.0000 mg | ORAL_CAPSULE | Freq: Every day | ORAL | 5 refills | Status: DC

## 2023-12-07 NOTE — Addendum Note (Signed)
 Addended by: ISADORA RAISIN on: 12/07/2023 02:56 PM   Modules accepted: Orders

## 2023-12-07 NOTE — Patient Instructions (Signed)
 Please stop at the lab to have labs drawn.  Hold welchol  temporarily.

## 2023-12-07 NOTE — Progress Notes (Signed)
 Patient ID: Debra Leach, female    DOB: 05-Apr-1951, 73 y.o.   MRN: [id number]  This visit was conducted in person.  BP (!) 154/82   Pulse 82   Temp 98.6 F (37 C) (Oral)   Ht 5' (1.524 m)   Wt 125 lb 6.4 oz (56.9 kg)   SpO2 95%   BMI 24.49 kg/m    CC:  Chief Complaint  Patient presents with   Diarrhea    Ongoing for about 2 weeks. On average about 4 times a day. Very soft, flat, some cramping. Not a lot of liquid    Subjective:   HPI: Debra Leach is a 73 y.o. female presenting on 12/07/2023 for Diarrhea (Ongoing for about 2 weeks. On average about 4 times a day. Very soft, flat, some cramping. Not a lot of liquid) Has history of autoimmune pancreatitis She notes 3 weeks of  soft stools 4-8 times a day.  Associated with some generalized lower abdominal cramping. Occ waking her a night.  Some intermittent  nausea She denies  vomiting and fever.  No blood in stool.  She has been losing weight, she is not eating well  Wt Readings from Last 3 Encounters:  12/07/23 125 lb 6.4 oz (56.9 kg)  11/30/23 129 lb 12.8 oz (58.9 kg)  11/15/23 128 lb 8 oz (58.3 kg)    Diabetes: CBGs running  She is drinking water and drinking Pedialyte off and on   Med changes: last week new migraine.SABRA added zonegran  She does take Colesevelam  for  high cholesterol. She did take some few days of clindamycin for possible sinus infection x 1 week. Trips out of the country:none Outside water sources: none  She has a history of C. difficile in 2024, neg testing in 11/2022  ? Pancreatic insufficiency  Relevant past medical, surgical, family and social history reviewed and updated as indicated. Interim medical history since our last visit reviewed. Allergies and medications reviewed and updated. Outpatient Medications Prior to Visit  Medication Sig Dispense Refill   aluminum-magnesium  hydroxide 200-200 MG/5ML suspension Take by mouth every 6 (six) hours as needed for indigestion.  As needed     apixaban  (ELIQUIS ) 2.5 MG TABS tablet TAKE 1 TABLET BY MOUTH 2 TIMES DAILY. 180 tablet 3   atorvastatin  (LIPITOR ) 80 MG tablet TAKE 1 TABLET BY MOUTH DAILY 90 tablet 3   Blood Glucose Monitoring Suppl (ONETOUCH VERIO REFLECT) w/Device KIT TEST TWICE A DAY 1 kit 0   budesonide  (ENTOCORT EC ) 3 MG 24 hr capsule TAKE 2 CAPSULES BY MOUTH DAILY. 180 capsule 12   colesevelam  (WELCHOL ) 625 MG tablet Take 625 mg by mouth 3 (three) times daily. (Patient taking differently: Take 625 mg by mouth 3 (three) times daily. Take 3 tabs 2 times daily)     Continuous Glucose Sensor (DEXCOM G7 SENSOR) MISC 1 Device by Does not apply route as directed. 9 each 3   Eptinezumab-jjmr (VYEPTI) 100 MG/ML injection Inject 300 mg into the vein every 3 (three) months.     famotidine  (PEPCID ) 10 MG tablet Take 10 mg by mouth 2 (two) times daily.     ferrous gluconate  (FERGON) 324 MG tablet TAKE 1 TABLET (324 MG TOTAL) BY MOUTH DAILY WITH BREAKFAST. 90 tablet 1   gabapentin  (NEURONTIN ) 100 MG capsule TAKE 1 CAPSULE BY MOUTH UP TO 3 TIMES DAILY AS NEEDED. 270 capsule 3   gabapentin  (NEURONTIN ) 300 MG capsule TAKE 2 CAPSULES (600 MG TOTAL) BY MOUTH AT BEDTIME.  180 capsule 1   insulin  glargine (LANTUS  SOLOSTAR) 100 UNIT/ML Solostar Pen Inject 12 Units into the skin daily.     Insulin  Pen Needle 32G X 4 MM MISC 1 Device by Does not apply route in the morning, at noon, in the evening, and at bedtime. 400 each 3   Lancets (ONETOUCH DELICA PLUS LANCET30G) MISC USE TO CHECK BLOOD SUGAR TWO TIMES DAILY 200 each 3   meclizine  (ANTIVERT ) 25 MG tablet Take 1 tablet (25 mg total) by mouth 3 (three) times daily as needed for dizziness. 30 tablet 1   ondansetron  (ZOFRAN ) 8 MG tablet Take 1 tablet (8 mg total) by mouth every 8 (eight) hours as needed for nausea or vomiting. 30 tablet 1   ONETOUCH VERIO test strip USE TO CHECK BLOOD SUGAR TWO TIMES A DAY 200 strip 3   pantoprazole  (PROTONIX ) 40 MG tablet Take 1 tablet (40 mg total)  by mouth daily. 30 tablet 3   potassium chloride  SA (KLOR-CON  M) 20 MEQ tablet TAKE 1 TABLET BY MOUTH DAILY. 90 tablet 3   repaglinide  (PRANDIN ) 1 MG tablet Take 1 tablet (1 mg total) by mouth daily before breakfast AND 2 tablets (2 mg total) daily before lunch AND 4 tablets (4 mg total) daily before supper.     triamcinolone (NASACORT ALLERGY 24HR) 55 MCG/ACT AERO nasal inhaler Place 2 sprays into the nose daily.     Ubrogepant  (UBRELVY ) 100 MG TABS Take 1 tablet (100 mg total) by mouth as needed (May repeat dose after 2 hours if needed.  Maximum 2 tablets in 24 hours). 10 tablet 3   vitamin B-12 (CYANOCOBALAMIN ) 1000 MCG tablet Take 1,000 mcg by mouth daily with breakfast.     Vitamin D , Ergocalciferol , (DRISDOL ) 1.25 MG (50000 UNIT) CAPS capsule TAKE 1 CAPSULE BY MOUTH ONCE A WEEK. 12 capsule 3   zonisamide  (ZONEGRAN ) 25 MG capsule Take 1 capsule daily for one week, then 2 capsules daily for one week, then 3 capsules daily for one week, then 4 capsules daily 120 capsule 0   Colesevelam  HCl 3.75 g PACK Take 1 packet (3.75 g total) by mouth daily. 30 each 11   predniSONE  (DELTASONE ) 20 MG tablet Take 1 tablet (20 mg total) by mouth daily with breakfast. 7 tablet 0   No facility-administered medications prior to visit.     Per HPI unless specifically indicated in ROS section below Review of Systems  Constitutional:  Negative for fatigue and fever.  HENT:  Negative for congestion.   Eyes:  Negative for pain.  Respiratory:  Negative for cough and shortness of breath.   Cardiovascular:  Negative for chest pain, palpitations and leg swelling.  Gastrointestinal:  Positive for abdominal pain, diarrhea and nausea.  Genitourinary:  Negative for dysuria and vaginal bleeding.  Musculoskeletal:  Negative for back pain.  Neurological:  Negative for syncope, light-headedness and headaches.  Psychiatric/Behavioral:  Negative for dysphoric mood.    Objective:  BP (!) 154/82   Pulse 82   Temp 98.6 F  (37 C) (Oral)   Ht 5' (1.524 m)   Wt 125 lb 6.4 oz (56.9 kg)   SpO2 95%   BMI 24.49 kg/m   Wt Readings from Last 3 Encounters:  12/07/23 125 lb 6.4 oz (56.9 kg)  11/30/23 129 lb 12.8 oz (58.9 kg)  11/15/23 128 lb 8 oz (58.3 kg)      Physical Exam Constitutional:      General: She is not in acute distress.  Appearance: Normal appearance. She is well-developed. She is not ill-appearing or toxic-appearing.  HENT:     Head: Normocephalic.     Right Ear: Hearing, tympanic membrane, ear canal and external ear normal. Tympanic membrane is not erythematous, retracted or bulging.     Left Ear: Hearing, tympanic membrane, ear canal and external ear normal. Tympanic membrane is not erythematous, retracted or bulging.     Nose: No mucosal edema or rhinorrhea.     Right Sinus: No maxillary sinus tenderness or frontal sinus tenderness.     Left Sinus: No maxillary sinus tenderness or frontal sinus tenderness.     Mouth/Throat:     Pharynx: Uvula midline.  Eyes:     General: Lids are normal. Lids are everted, no foreign bodies appreciated.     Conjunctiva/sclera: Conjunctivae normal.     Pupils: Pupils are equal, round, and reactive to light.  Neck:     Thyroid : No thyroid  mass or thyromegaly.     Vascular: No carotid bruit.     Trachea: Trachea normal.  Cardiovascular:     Rate and Rhythm: Normal rate and regular rhythm.     Pulses: Normal pulses.     Heart sounds: Normal heart sounds, S1 normal and S2 normal. No murmur heard.    No friction rub. No gallop.  Pulmonary:     Effort: Pulmonary effort is normal. No tachypnea or respiratory distress.     Breath sounds: Normal breath sounds. No decreased breath sounds, wheezing, rhonchi or rales.  Abdominal:     General: Bowel sounds are normal.     Palpations: Abdomen is soft.     Tenderness: There is no abdominal tenderness.  Musculoskeletal:     Cervical back: Normal range of motion and neck supple.  Skin:    General: Skin is warm  and dry.     Findings: No rash.  Neurological:     Mental Status: She is alert.  Psychiatric:        Mood and Affect: Mood is not anxious or depressed.        Speech: Speech normal.        Behavior: Behavior normal. Behavior is cooperative.        Thought Content: Thought content normal.        Judgment: Judgment normal.       Results for orders placed or performed in visit on 10/12/23  Surgical pathology ([facility])   Collection Time: 10/12/23 12:00 AM  Result Value Ref Range   SURGICAL PATHOLOGY      SURGICAL PATHOLOGY [facility] [address] [place], [place] [address] Telephone [phone] or [phone] Fax [phone]  REPORT OF SURGICAL PATHOLOGY   Accession #: [id number] Patient Name: SHARDA, KEDDY Visit # : [id number]  MRN: [id number] Physician: Wilhelmenia Roers DOB/Age Dec 18, 1950 (Age: 58) Gender: F Collected Date: 10/12/2023 Received Date: 10/13/2023  FINAL DIAGNOSIS       1. Surgical [P], gastric random :       GASTRIC ANTRAL AND OXYNTIC MUCOSA WITH HYPEREMIA.      NEGATIVE FOR HELICOBACTER PYLORI.       2. Surgical [P], gastric antrum polyp x 1, polyp (1) :       HYPERPLASTIC GASTRIC POLYP.       3. Surgical [P], random sites colon :       COLONIC MUCOSA WITH NO SIGNIFICANT PATHOLOGIC CHANGES.      NO MICROSCOPIC COLITIS, ACTIVE INFLAMMATION OR GRANULOMAS.  4. Surgical [P], colon, ascending x 1, transverse x 2, descending x 2, polyp (5) :       SESSILE SERRATED POLYP (1) WITHOUT CYTOLOGIC DYSPLA SIA.      COLONIC MUCOSA WITH BENIGN LYMPHOID AGGREGATES.      MULTIPLE ADDITIONAL LEVELS EXAMINED.       5. Surgical [P], colon, rectum :       COLONIC MUCOSA WITH NO SIGNIFICANT PATHOLOGIC CHANGES.      NO MICROSCOPIC COLITIS, ACTIVE INFLAMMATION OR GRANULOMAS.       ELECTRONIC SIGNATURE : Belvie Come, John, Pathologist, Electronic Signature  MICROSCOPIC DESCRIPTION  CASE COMMENTS STAINS USED IN  DIAGNOSIS: H&E H&E H&E H&E-2 H&E H&E-2 *RECUT DEEPER X 9 LEVELS *RECUT DEEPER X 9 LEVELS *RECUT DEEPER X 9 LEVELS *RECUT DEEPER X 9 LEVELS H&E H&E-2    CLINICAL HISTORY  SPECIMEN(S) OBTAINED 1. Surgical [P], Gastric Random 2. Surgical [P], Gastric Antrum Polyp X 1, Polyp (1) 3. Surgical [P], Random Sites Colon 4. Surgical [P], Colon, Ascending X 1, Transverse X 2, Descending X 2, Polyp (5) 5. Surgical [P], Colon, Rectum  SPECIMEN COMMENTS: 1. 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HCC); esophageal dysphagia; history of clostridioides difficile infection; IGG4-rel ated sclerosing disease (HCC); esophageal stricture; irregular Z line of esophagus; gastric polyp; benign neoplasm of ascending colon; change in bowel habits; benign neoplasm of transverse colon; benign neoplasm of descending colon SPECIMEN CLINICAL INFORMATION: 1. R/O H.pylori 2. R/O adenoma 3. R/O microscopic colitis 4. R/O adenoma 5. R/O other chronic proctitis    Gross Description 1. Received in formalin are tan, soft tissue fragments that are submitted in toto.Number: multiple, Size: 0.2 cm smallest to 0.6 cm largest, (1B) ( TA ) 2. Received in formalin are tan, soft tissue fragments that are submitted in toto.Number: 1 Size: 0.3 cm, (1B) ( TA ) 3. Received in formalin are tan, soft tissue fragments that are submitted in toto.Number: multiple, Size: 0.2 cm smallest to 0.5 cm largest, (1B) ( TA ) 4. Received in formalin are tan, soft tissue fragments that are submitted in toto.Number: 5, Size: 0.2 cm smallest to 1.3 cm largest, (1B) ( TA ) 5. Received in forma lin are tan, soft tissue fragments that are submitted in toto.Number: multiple, Size: 0.2 cm smallest to 0.4 cm largest, (1B) ( TA )        Report signed out from the following location(s) [place] [address] [place], [place] [address] CLIA #: [id number]  [facility] [address] [place], [place] [address]  CLIA #: [id number]    *Note: Due to a large number of results and/or encounters for the requested time period, some results have not been displayed. A complete set of results can be found in Results Review.    Assessment and Plan  Acute diarrhea Assessment & Plan:  Acute,  Given history of C. difficile and recent clindamycin use will check C. difficile and likely repeat x 1. Check GI pathogen panel.  Zonegran  and WelChol  both have possible side effects of diarrhea.  She will hold the WelChol  for now.  Zonegran  has helped with her migraines significantly and there is not clearly a temporal association with the diarrhea so we will continue this for now.  Will check a electrolytes, kidney and liver function for electrolyte abnormality, dehydration etc.  Encouraged her to keep up with stool output with fluids and electrolytes.  Return and ER precautions provided.  Orders: -     Comprehensive metabolic panel with  GFR -     C. difficile GDH and Toxin A/B; Future -     GI Profile, Stool, PCR; Future    No follow-ups on file.   Greig Ring, MD

## 2023-12-07 NOTE — Assessment & Plan Note (Signed)
 Acute,  Given history of C. difficile and recent clindamycin use will check C. difficile and likely repeat x 1. Check GI pathogen panel.  Zonegran  and WelChol  both have possible side effects of diarrhea.  She will hold the WelChol  for now.  Zonegran  has helped with her migraines significantly and there is not clearly a temporal association with the diarrhea so we will continue this for now.  Will check a electrolytes, kidney and liver function for electrolyte abnormality, dehydration etc.  Encouraged her to keep up with stool output with fluids and electrolytes.  Return and ER precautions provided.

## 2023-12-07 NOTE — Telephone Encounter (Signed)
 See mychart message on how she is doing.

## 2023-12-08 ENCOUNTER — Encounter: Payer: Self-pay | Admitting: Gastroenterology

## 2023-12-08 ENCOUNTER — Other Ambulatory Visit: Payer: Self-pay | Admitting: Family Medicine

## 2023-12-08 LAB — C. DIFFICILE GDH AND TOXIN A/B
GDH ANTIGEN: DETECTED
MICRO NUMBER:: 16652297
SPECIMEN QUALITY:: ADEQUATE
TOXIN A AND B: DETECTED

## 2023-12-08 MED ORDER — FIDAXOMICIN 200 MG PO TABS: 200.0000 mg | ORAL_TABLET | Freq: Two times a day (BID) | ORAL | 0 refills | Status: DC

## 2023-12-08 NOTE — Addendum Note (Signed)
 Addended by: AVELINA NO E on: 12/08/2023 05:00 PM   Modules accepted: Orders

## 2023-12-08 NOTE — Progress Notes (Signed)
 Given initial treatment April 2024 was with the Doxy mycin we will treat with vancomycin .

## 2023-12-14 ENCOUNTER — Other Ambulatory Visit: Payer: Self-pay | Admitting: Neurology

## 2023-12-14 ENCOUNTER — Encounter: Payer: Self-pay | Admitting: Neurology

## 2023-12-14 ENCOUNTER — Encounter: Payer: Self-pay | Admitting: Internal Medicine

## 2023-12-15 ENCOUNTER — Other Ambulatory Visit: Payer: Self-pay

## 2023-12-15 MED ORDER — ACCU-CHEK GUIDE ME W/DEVICE KIT
PACK | 0 refills | Status: AC
Start: 2023-12-15 — End: ?

## 2023-12-15 MED ORDER — ACCU-CHEK FASTCLIX LANCETS MISC
3 refills | Status: AC
Start: 2023-12-15 — End: ?

## 2023-12-15 MED ORDER — ACCU-CHEK GUIDE TEST VI STRP: ORAL_STRIP | 12 refills | Status: AC

## 2023-12-16 LAB — GASTROINTESTINAL PATHOGEN PNL
CampyloBacter Group: NOT DETECTED
Norovirus GI/GII: NOT DETECTED
Rotavirus A: NOT DETECTED
Salmonella species: NOT DETECTED
Shiga Toxin 1: NOT DETECTED
Shiga Toxin 2: NOT DETECTED
Shigella Species: NOT DETECTED
Vibrio Group: NOT DETECTED
Yersinia enterocolitica: NOT DETECTED

## 2023-12-16 LAB — TEST AUTHORIZATION

## 2023-12-16 LAB — C. DIFFICILE GDH AND TOXIN A/B
GDH ANTIGEN: DETECTED
MICRO NUMBER:: 16652290
SPECIMEN QUALITY:: ADEQUATE
TOXIN A AND B: DETECTED

## 2023-12-16 LAB — CALPROTECTIN: Calprotectin: 904 ug/g — ABNORMAL HIGH

## 2023-12-19 LAB — HM DIABETES EYE EXAM

## 2023-12-20 NOTE — Telephone Encounter (Signed)
 Dr Wilhelmenia see calprotectin in the chart under [facility]

## 2023-12-22 ENCOUNTER — Encounter: Payer: Self-pay | Admitting: Gastroenterology

## 2023-12-22 NOTE — Telephone Encounter (Signed)
 Dr Wilhelmenia see fecal cal done by Dr Avelina

## 2023-12-23 NOTE — Telephone Encounter (Signed)
 Start the probiotics. Metamucil or Benefiber to try and bulk the stool (start daily). If still having issues into early next week, she can message us  and then we will start Low-Dose Immodium (4 mg a day).. If still issues into next week, she may need repeat testing and potential retreatment. Please find out the date of start/completion of her Dificid  as well. Thanks. GM

## 2023-12-23 NOTE — Telephone Encounter (Signed)
 Dificid  was started on 7/3 and finished 12/18/23

## 2023-12-23 NOTE — Telephone Encounter (Signed)
 Patty, Please let patient know that I have reviewed the chart. I agree with Fidaxomicin  for treatment of her C. difficile. This is the reason why her fecal calprotectin is elevated. After the completion of her Fidaxomicin  treatment, she should be continued on probiotics align or VSL-3 in effort of trying to decrease risk of recurring C. difficile. If she has another bout of C. difficile in the future, we need to consider Rebyota or VOWST. She is already too into the treatment for her C. difficile for us  to consider these treatments. Thanks. GM

## 2023-12-23 NOTE — Telephone Encounter (Signed)
 Dr Wilhelmenia see recent message from pt regarding loose stools continuing after finishing abx course

## 2024-01-03 NOTE — Telephone Encounter (Signed)
 I would recommend that we continue Benefiber and use 1 to 2 doses daily in order to try to bulk the stool further. I would try to keep the align daily for now rather than every other day. GM

## 2024-01-04 ENCOUNTER — Other Ambulatory Visit: Payer: Self-pay | Admitting: Neurology

## 2024-01-04 ENCOUNTER — Encounter: Payer: Self-pay | Admitting: Gastroenterology

## 2024-01-04 ENCOUNTER — Other Ambulatory Visit: Payer: Self-pay | Admitting: Family Medicine

## 2024-01-04 ENCOUNTER — Encounter: Payer: Self-pay | Admitting: Neurology

## 2024-01-04 DIAGNOSIS — Z8619 Personal history of other infectious and parasitic diseases: Secondary | ICD-10-CM

## 2024-01-04 DIAGNOSIS — R197 Diarrhea, unspecified: Secondary | ICD-10-CM

## 2024-01-10 ENCOUNTER — Encounter: Payer: Self-pay | Admitting: Gastroenterology

## 2024-01-10 NOTE — Telephone Encounter (Signed)
 Plan for patient to return for stool studies. C. difficile PCR testing to be obtained as well as fecal calprotectin. After stool study has been obtained, she may use Imodium  4 mg initially in the morning and up to 10 mg/day. Have a clinic appointment scheduled in the coming weeks with APP or myself.  No issues from an infusion standpoint for her to move forward with that. No issues from a hygienist standpoint either for her to have her procedures/therapies.  But see the stool study results.  If recurring C. difficile, will need Fidaxomicin  and potentially Reybyota or VOWST.  Thanks. GM

## 2024-01-11 ENCOUNTER — Encounter: Payer: Self-pay | Admitting: Neurology

## 2024-01-11 NOTE — Addendum Note (Signed)
 Addended by: ANITRA ODETTA CROME on: 01/11/2024 09:47 AM   Modules accepted: Orders

## 2024-01-12 ENCOUNTER — Other Ambulatory Visit

## 2024-01-12 DIAGNOSIS — Z8619 Personal history of other infectious and parasitic diseases: Secondary | ICD-10-CM

## 2024-01-12 DIAGNOSIS — R197 Diarrhea, unspecified: Secondary | ICD-10-CM

## 2024-01-13 ENCOUNTER — Other Ambulatory Visit: Payer: Self-pay | Admitting: Neurology

## 2024-01-13 ENCOUNTER — Ambulatory Visit: Payer: Self-pay | Admitting: Gastroenterology

## 2024-01-13 ENCOUNTER — Telehealth: Payer: Self-pay | Admitting: Gastroenterology

## 2024-01-13 DIAGNOSIS — A0471 Enterocolitis due to Clostridium difficile, recurrent: Secondary | ICD-10-CM

## 2024-01-13 LAB — CLOSTRIDIUM DIFFICILE BY PCR: Toxigenic C. Difficile by PCR: POSITIVE — AB

## 2024-01-13 MED ORDER — FIDAXOMICIN 200 MG PO TABS: 200.0000 mg | ORAL_TABLET | Freq: Two times a day (BID) | ORAL | Status: AC

## 2024-01-13 MED ORDER — ZONISAMIDE 100 MG PO CAPS: 200.0000 mg | ORAL_CAPSULE | Freq: Every day | ORAL | 5 refills | Status: DC

## 2024-01-13 NOTE — Telephone Encounter (Signed)
 PCR returned positive for C. Difficile today. Now 3 episode. Will send Fidaxomicin  BID x 10-days. May need VOWST or Reybyota. We will follow up with her next week to see how she is doing.

## 2024-01-14 ENCOUNTER — Encounter: Payer: Self-pay | Admitting: Gastroenterology

## 2024-01-14 LAB — CALPROTECTIN, FECAL: Calprotectin, Fecal: 1840 ug/g — ABNORMAL HIGH (ref 0–120)

## 2024-01-16 ENCOUNTER — Other Ambulatory Visit: Payer: Self-pay

## 2024-01-16 MED ORDER — FIDAXOMICIN 200 MG PO TABS: 200.0000 mg | ORAL_TABLET | Freq: Two times a day (BID) | ORAL | 0 refills | Status: DC

## 2024-01-16 NOTE — Telephone Encounter (Signed)
Prescription has been sent and pt aware

## 2024-01-17 NOTE — Telephone Encounter (Signed)
 I called and spoke with the patient this afternoon. We went over the benefits of Rebyota and Vowst therapies in an effort of trying to decrease her risk of recurrence of C. difficile again. She is currently on her treatment for her second recurrence of C. difficile with Fidaxomicin  at this time. She would like to move forward with Rebyota. I will forward this to my team so that we can see about how we can get this potentially approved for the patient. If there are issues with Rebyota being approved, then she is open to Vowst and we can then move forward with that. Hopefully that will not be the case however.  Patty and Sheri, Can we move forward with trying to get Rebyota approved? Thanks. GM

## 2024-01-18 NOTE — Telephone Encounter (Signed)
    Dr Wilhelmenia I have submitted a benefits verification form to Rebyota.  Per Tylene this has to be completed in the pt home using home health.  There are many requirements that must be met to qualify.  Once the verification is complete we will be notified if covered.  There is a link coming to your email for you to sign to begin the process.

## 2024-01-18 NOTE — Telephone Encounter (Signed)
 Fingers crossed.

## 2024-01-20 ENCOUNTER — Other Ambulatory Visit: Payer: Self-pay

## 2024-01-20 ENCOUNTER — Telehealth: Payer: Self-pay | Admitting: Gastroenterology

## 2024-01-20 MED ORDER — FIDAXOMICIN 200 MG PO TABS: 200.0000 mg | ORAL_TABLET | ORAL | 0 refills | Status: DC

## 2024-01-20 MED ORDER — FIDAXOMICIN 200 MG PO TABS
200.0000 mg | ORAL_TABLET | ORAL | 0 refills | Status: DC
Start: 2024-01-20 — End: 2024-01-31

## 2024-01-20 NOTE — Telephone Encounter (Signed)
Prescription has been sent and pt aware

## 2024-01-20 NOTE — Telephone Encounter (Signed)
 Send #20.

## 2024-01-20 NOTE — Telephone Encounter (Signed)
 Dr Wilhelmenia ok to send #20

## 2024-01-20 NOTE — Telephone Encounter (Signed)
 Patty, I am sorry to hear this. Who is going to contact Rebyota connect to see if there is another site? I would update the patient and let her know that we may need to transition to Vowst, and if she is okay with that, then lets try to get the approval of that. How many more days of Fidaxomicin  does she have, as we may need to extend her treatment with a taper so that we do not lose opportunity to get 1 of these treatments and to decrease her risk of recurrence? Thanks. GM

## 2024-01-20 NOTE — Telephone Encounter (Signed)
 The pt has been advised and states she has 6 days left of med left.  I have already contacted [facility] and started the process of trying to find outside facility.

## 2024-01-20 NOTE — Telephone Encounter (Signed)
 Received call from patients pharmacy regarding Dificid  medication (10 pcs) that was ordered today, states they aren't allowed to break up pack sizes (20 pcs). Would like another order sent for full size, Please review and advise.  Thank you

## 2024-01-20 NOTE — Telephone Encounter (Signed)
 The Rebyota rep called and states that the pt has an exclusion on her insurance plan for the Rebyota to be given in the home.  Per Tylene this cannot be done in office.  They will contact Rebyota connect and see if there is an alternate site to have completed but she is not certain that will be available.  Pt may need Vowst.  The number to call the connect program is [phone].

## 2024-01-20 NOTE — Telephone Encounter (Signed)
#  20 prescription has been sent to the pharmacy

## 2024-01-20 NOTE — Telephone Encounter (Signed)
 Debra Leach, Because of the potential need of other therapies (pending Rebyota or Vowst) and her having only 6 days left in typical treatment and since this has been a second recurrence (third episode), I recommend that we do a taper of Fidaxomicin . Once her 6 days are completed, I would like her to begin every other day 200 mg Fidaxomicin  for 20 days (this would be an additional 10 pills). This will give us  a bit more time to try to get approval for 1 of these other therapies. Please let patient know and move forward with this. Thanks. GM

## 2024-01-23 ENCOUNTER — Telehealth: Payer: Self-pay | Admitting: Gastroenterology

## 2024-01-23 ENCOUNTER — Telehealth: Payer: Self-pay

## 2024-01-23 ENCOUNTER — Ambulatory Visit: Admitting: Gastroenterology

## 2024-01-23 NOTE — Telephone Encounter (Signed)
 Inbound call from [facility] regarding benefit outcome of Rebyota. Requesting a call back at [phone]. Please advise, thank you

## 2024-01-23 NOTE — Telephone Encounter (Signed)
 [facility] update, Patient seen 01/18/24 for Vyepti 300 mg tolerated well.  Next infusion 04/19/24

## 2024-01-23 NOTE — Telephone Encounter (Signed)
 As discussed Debra Leach, important for us  to understand if Rebyota has been approved and if this can be done through home health agency or if another facility needs to be found. Please update me once we have this information. GM

## 2024-01-23 NOTE — Telephone Encounter (Signed)
 [facility] access support program is calling to update us  that the Rebyota has been covered and no PA is necessary. They sent a fax regarding the information. Please call [phone] open M-F 9-6pm for any further concerns.

## 2024-01-24 ENCOUNTER — Encounter: Payer: Self-pay | Admitting: Gastroenterology

## 2024-01-24 NOTE — Telephone Encounter (Signed)
 Adding Sheri in for communication follow through

## 2024-01-24 NOTE — Telephone Encounter (Signed)
 Let me know what you all find out in regards to location that she can have the Reybyota completed, and let the patient know. Thank you. GM

## 2024-01-24 NOTE — Telephone Encounter (Signed)
 Called [employer]. Spoke with Hargis. She reports that the patient is covered at 100% for buy and bill only. She is not covered for at home treatment. She reports that in order to get patient coverage for at home, we would need to reach out to [facility]. They're phone number is [phone]. I also attempted to call Luke from the first message, to see if she had any further insight into the case, but had to leave a vm.

## 2024-01-24 NOTE — Telephone Encounter (Signed)
 Received a return call from [place]. She reports that the patient receiving the medication at home is not an option per her insurance. We would need to find an outpatient facility. She did say that the sales representative for our area Devere Plunk can assist with finding a location and sending a referral. Her phone number is [phone].

## 2024-01-25 ENCOUNTER — Encounter: Payer: Self-pay | Admitting: Gastroenterology

## 2024-01-25 NOTE — Telephone Encounter (Signed)
 Called and spoke with patient. Discussed current plan to send referral to [facility]. Patient states she is fine with sending it there.

## 2024-01-25 NOTE — Telephone Encounter (Signed)
 Update the patient. Make sure she is OK with this. If she is then move forward. Otherwise, if she is not comfortable with this plan, then we will need to initiate VOWST approval for recurrent C. Difficile. Thanks for all of this help Brandi. GM

## 2024-01-25 NOTE — Telephone Encounter (Signed)
 Received return call from Devere Gosling. She gave me the name and number of a clinic and their contact person. She said [facility] in [place] performs Reybyota administration in the endoscopy clinic. The contact person is Darice Heidelberg, she is the GI triage RN. Her direct number is [phone]. Her fax number is [phone]. Per Devere we need to send OV notes, labs, and any imaging over and write urgent on the referral as well as the reason for the referral which is recurrent C-diff.   Am I ok to go ahead and send the referral? I can also call and update the patient once I get the ok to send the referral.

## 2024-01-25 NOTE — Telephone Encounter (Signed)
 Attempted to reach out to Devere Gosling, the drug rep. No answer. Left vm with my personal work phone number for return call.

## 2024-01-26 ENCOUNTER — Encounter: Payer: Self-pay | Admitting: Neurology

## 2024-01-26 NOTE — Telephone Encounter (Signed)
 Received a follow up call from Darice Heidelberg, she verified that they have received the records. She states that they will get the patient in for a consult, and they will also do a benefits investigation to ensure patient is covered for the procedure. They will fax us  a copy of everything when it is complete.

## 2024-01-26 NOTE — Telephone Encounter (Signed)
 Confirmation fax received for Reybyota referral.

## 2024-01-26 NOTE — Telephone Encounter (Signed)
 Brandi, Thank you for all of the hard work on this. I appreciate you stepping up. This will be helpful for other patient's down the road. I think, writing this down as a pathway and having these number handy in some format for other patients down the road will help decrease stress for other Rns in the future. Hopefully all gets accomplished. GM

## 2024-01-26 NOTE — Telephone Encounter (Signed)
 All information printed and faxed to Darice Heidelberg, RN at the fax number provided.

## 2024-01-27 NOTE — Telephone Encounter (Signed)
 Please let the patient know she should begin the taper of Fidaxomicin  as outlined.  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 Hopefully we will be able to have her seen and get her Rebyota before Fidaxomicin  completes itself. If not, it is possible we will send in a little bit longer taper so she can get the Rebyota in her, after her consultation. Thanks. GM

## 2024-01-30 ENCOUNTER — Telehealth: Payer: Self-pay

## 2024-01-30 NOTE — Telephone Encounter (Signed)
-----   Message from Nurse Brandi D sent at 01/30/2024 10:54 AM EDT ----- No, I was given the [facility] contact by the drug rep for this medication. When I faxed over the referral, I called her to discuss everything. She said that once they had all of the information on the patient, they would conduct their own benefits study. That is the last I have heard of any of it. ----- Message ----- From: Anitra Odetta CROME, RN Sent: 01/30/2024   8:09 AM EDT To: Almarie DELENA Goo, LPN; Daphne Moats, RN  Have either of you seen an actual auth with approval for this come in? ----- Message ----- From: Wilhelmenia Aloha Raddle., MD Sent: 01/24/2024   5:01 PM EDT To: Tylene CROME Molt, RN; Odetta CROME Anitra, RN; Gordy M#  Case discussed with Tylene this afternoon. She will communicate with RN team. Thanks. GM ----- Message ----- From: Molt Tylene CROME, RN Sent: 01/24/2024   3:22 PM EDT To: Odetta CROME Anitra, RN; Gordy CHRISTELLA Starch, MD; Elizab#  I saw the messages in the chart, but would like to communicate about this more outside of the patient's chart.   The last time we tried the Rebyota here we were never reimbursed for the drug, despite having an approval.  The drug cost is $11,126.44 to the practice when we order it from [facility].   We were assured that the drug was covered 100 % the last time.  I am certain the rep is going to come back and say we are an approved outpatient facility.  If you all want to proceed with this, I need your authorization and make sure you understand the cost to the practice and the risk of not being reimbursed.   Daphne Odetta, Beth has the company provided you the authorization in writing that we can read what the authorization says?  Is the drug and the administration approved at 100%? Please communicate with the rep Charmaine Public, was the last one, [phone] .  There was no assigned CPT codes from Medicare when it was given here the one and only time. The CPT code was an miscellaneous J code and C  code unclassified drug or biologic.  The CPT codes they provided for us  to bill with were for an oncology Lupron injection in the [facility] system. Because there was no assigned CPT code it was not reimbursed.  The drug payment came in the form of a pre-paid card from the company that we could not apply to the account here on site and it was a huge mess.   Dr. Caryn and Pyrtle, if you decide to proceed here at [facility] the patient will need to be brought in for a nurse visit and a nurse will need to administer the enema in the office. Some other outpatient clinics [facility] did this a few times, but they are not a P&L and the cost of the drug did not hit the practice. My opinion is we need more assurances this time that it will work.  Raymond it was January 2023 and maybe they have the CPT code now.   sheri

## 2024-01-31 ENCOUNTER — Ambulatory Visit: Payer: Self-pay | Admitting: Family Medicine

## 2024-01-31 ENCOUNTER — Encounter: Payer: Self-pay | Admitting: Family Medicine

## 2024-01-31 ENCOUNTER — Ambulatory Visit (INDEPENDENT_AMBULATORY_CARE_PROVIDER_SITE_OTHER): Admitting: Family Medicine

## 2024-01-31 VITALS — BP 128/84 | HR 75 | Temp 98.2°F | Ht 60.0 in | Wt 120.1 lb

## 2024-01-31 DIAGNOSIS — R109 Unspecified abdominal pain: Secondary | ICD-10-CM | POA: Insufficient documentation

## 2024-01-31 DIAGNOSIS — R1011 Right upper quadrant pain: Secondary | ICD-10-CM

## 2024-01-31 LAB — CBC WITH DIFFERENTIAL/PLATELET
Basophils Absolute: 0.1 K/uL (ref 0.0–0.1)
Basophils Relative: 0.6 % (ref 0.0–3.0)
Eosinophils Absolute: 0.1 K/uL (ref 0.0–0.7)
Eosinophils Relative: 1 % (ref 0.0–5.0)
HCT: 45.4 % (ref 36.0–46.0)
Hemoglobin: 14.3 g/dL (ref 12.0–15.0)
Lymphocytes Relative: 14 % (ref 12.0–46.0)
Lymphs Abs: 1.7 K/uL (ref 0.7–4.0)
MCHC: 31.5 g/dL (ref 30.0–36.0)
MCV: 91.3 fl (ref 78.0–100.0)
Monocytes Absolute: 0.7 K/uL (ref 0.1–1.0)
Monocytes Relative: 6 % (ref 3.0–12.0)
Neutro Abs: 9.3 K/uL — ABNORMAL HIGH (ref 1.4–7.7)
Neutrophils Relative %: 78.4 % — ABNORMAL HIGH (ref 43.0–77.0)
Platelets: 324 K/uL (ref 150.0–400.0)
RBC: 4.97 Mil/uL (ref 3.87–5.11)
RDW: 15.8 % — ABNORMAL HIGH (ref 11.5–15.5)
WBC: 11.9 K/uL — ABNORMAL HIGH (ref 4.0–10.5)

## 2024-01-31 LAB — POC URINALSYSI DIPSTICK (AUTOMATED)
Bilirubin, UA: NEGATIVE
Blood, UA: NEGATIVE
Glucose, UA: NEGATIVE
Ketones, UA: NEGATIVE
Nitrite, UA: NEGATIVE
Protein, UA: NEGATIVE
Spec Grav, UA: 1.01 (ref 1.010–1.025)
Urobilinogen, UA: 0.2 U/dL
pH, UA: 6 (ref 5.0–8.0)

## 2024-01-31 LAB — COMPREHENSIVE METABOLIC PANEL WITH GFR
ALT: 19 U/L (ref 0–35)
AST: 18 U/L (ref 0–37)
Albumin: 3.8 g/dL (ref 3.5–5.2)
Alkaline Phosphatase: 61 U/L (ref 39–117)
BUN: 11 mg/dL (ref 6–23)
CO2: 30 meq/L (ref 19–32)
Calcium: 8.8 mg/dL (ref 8.4–10.5)
Chloride: 105 meq/L (ref 96–112)
Creatinine, Ser: 0.81 mg/dL (ref 0.40–1.20)
GFR: 72.36 mL/min (ref >60.00)
Glucose, Bld: 100 mg/dL — ABNORMAL HIGH (ref 70–99)
Potassium: 3.6 meq/L (ref 3.5–5.1)
Sodium: 144 meq/L (ref 135–145)
Total Bilirubin: 0.6 mg/dL (ref 0.2–1.2)
Total Protein: 6.8 g/dL (ref 6.0–8.3)

## 2024-01-31 LAB — LIPASE: Lipase: 10 U/L — ABNORMAL LOW (ref 11.0–59.0)

## 2024-01-31 NOTE — Assessment & Plan Note (Signed)
 Acute, not primary area of pain but noted on exam.  Will check urine for infection or blood.  Patient with history of nephrolithiasis.

## 2024-01-31 NOTE — Addendum Note (Signed)
 Addended by: WENDELL ARLAND RAMAN on: 01/31/2024 10:57 AM   Modules accepted: Orders

## 2024-01-31 NOTE — Progress Notes (Signed)
 Patient ID: Debra Leach, female    DOB: 1950-07-24, 73 y.o.   MRN: [id number]  This visit was conducted in person.  BP 128/84   Pulse 75   Temp 98.2 F (36.8 C) (Temporal)   Ht 5' (1.524 m)   Wt 120 lb 2 oz (54.5 kg)   SpO2 99%   BMI 23.46 kg/m    CC:  Chief Complaint  Patient presents with   Abdominal Pain    RUQ    Subjective:   HPI: Debra Leach is a 73 y.o. female presenting on 01/31/2024 for Abdominal Pain (RUQ)   Pt with complicated history of autoimmune pancreatitis, currently with C. 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 ( Round 3)  Dr. Melba notes reviewed.  Today she presents with  RUQ pain.SABRA dull lasting hours, occ sharp jabbing pain.  Started 1.5 week ago.  No change with meals.   No clear trigger.  No current diarrhea on days she is taking  antibitoics... 3-4 BMs per day on  bad days.   No N/V.  No jaundice  No rash.  No dysuria.  Does not feel like previous kidney stone.   Wt Readings from Last 3 Encounters:  01/31/24 120 lb 2 oz (54.5 kg)  12/07/23 125 lb 6.4 oz (56.9 kg)  11/30/23 129 lb 12.8 oz (58.9 kg)    Past CT  08/2023 showed cholelithiasis     Relevant past medical, surgical, family and social history reviewed and updated as indicated. Interim medical history since our last visit reviewed. Allergies and medications reviewed and updated. Outpatient Medications Prior to Visit  Medication Sig Dispense Refill   Accu-Chek FastClix Lancets MISC Check blood sugar twice daily 102 each 3   aluminum-magnesium  hydroxide 200-200 MG/5ML suspension Take by mouth every 6 (six) hours as needed for indigestion. As needed     apixaban  (ELIQUIS ) 2.5 MG TABS tablet TAKE 1 TABLET BY MOUTH 2 TIMES DAILY. 180 tablet 3   atorvastatin  (LIPITOR ) 80 MG tablet TAKE 1 TABLET BY MOUTH DAILY 90 tablet 3   Blood Glucose Monitoring Suppl (ACCU-CHEK GUIDE ME)  w/Device KIT Check blood sugar twice daily 1 kit 0   budesonide  (ENTOCORT EC ) 3 MG 24 hr capsule TAKE 2 CAPSULES BY MOUTH DAILY. 180 capsule 12   colesevelam  (WELCHOL ) 625 MG tablet TAKE 3 TABLETS BY MOUTH 2 TIMES DAILY WITH A MEAL. 540 tablet 3   Continuous Glucose Sensor (DEXCOM G7 SENSOR) MISC 1 Device by Does not apply route as directed. 9 each 3   Eptinezumab-jjmr (VYEPTI) 100 MG/ML injection Inject 300 mg into the vein every 3 (three) months.     famotidine  (PEPCID ) 10 MG tablet Take 10 mg by mouth 2 (two) times daily.     ferrous gluconate  (FERGON) 324 MG tablet TAKE 1 TABLET (324 MG TOTAL) BY MOUTH DAILY WITH BREAKFAST. 90 tablet 1   fidaxomicin  (DIFICID ) 200 MG TABS tablet Take 1 tablet (200 mg total) by mouth every other day. 20 tablet 0   gabapentin  (NEURONTIN ) 100 MG capsule TAKE 1 CAPSULE BY MOUTH UP TO 3 TIMES DAILY AS NEEDED. 270 capsule 3   gabapentin  (NEURONTIN ) 300 MG capsule TAKE 2 CAPSULES (600 MG TOTAL) BY MOUTH AT BEDTIME. 180 capsule 1   glucose blood (ACCU-CHEK GUIDE TEST) test strip Check blood sugar twice daily 100 each 12   insulin  glargine (LANTUS  SOLOSTAR) 100 UNIT/ML Solostar Pen Inject 12 Units  into the skin daily.     Insulin  Pen Needle 32G X 4 MM MISC 1 Device by Does not apply route in the morning, at noon, in the evening, and at bedtime. 400 each 3   meclizine  (ANTIVERT ) 25 MG tablet Take 1 tablet (25 mg total) by mouth 3 (three) times daily as needed for dizziness. 30 tablet 1   ondansetron  (ZOFRAN ) 8 MG tablet Take 1 tablet (8 mg total) by mouth every 8 (eight) hours as needed for nausea or vomiting. 30 tablet 1   pantoprazole  (PROTONIX ) 40 MG tablet TAKE 1 TABLET (40 MG TOTAL) BY MOUTH DAILY. 30 tablet 3   potassium chloride  SA (KLOR-CON  M) 20 MEQ tablet TAKE 1 TABLET BY MOUTH DAILY. 90 tablet 3   repaglinide  (PRANDIN ) 1 MG tablet Take 1 tablet (1 mg total) by mouth daily before breakfast AND 2 tablets (2 mg total) daily before lunch AND 4 tablets (4 mg total)  daily before supper.     triamcinolone (NASACORT ALLERGY 24HR) 55 MCG/ACT AERO nasal inhaler Place 2 sprays into the nose daily.     UBRELVY  100 MG TABS TAKE 1 TABLET BY MOUTH AS NEEDED (MAY REPEAT DOSE AFTER 2 HOURS IF NEEDED. MAXIMUM 2 TABLETS IN 24 HOURS). 10 tablet 3   vitamin B-12 (CYANOCOBALAMIN ) 1000 MCG tablet Take 1,000 mcg by mouth daily with breakfast.     Vitamin D , Ergocalciferol , (DRISDOL ) 1.25 MG (50000 UNIT) CAPS capsule TAKE 1 CAPSULE BY MOUTH ONCE A WEEK. 12 capsule 3   zonisamide  (ZONEGRAN ) 100 MG capsule Take 100 mg by mouth daily.     Blood Glucose Monitoring Suppl (ONETOUCH VERIO REFLECT) w/Device KIT TEST TWICE A DAY 1 kit 0   fidaxomicin  (DIFICID ) 200 MG TABS tablet Take 1 tablet (200 mg total) by mouth every other day. 10 tablet 0   Lancets (ONETOUCH DELICA PLUS LANCET30G) MISC USE TO CHECK BLOOD SUGAR TWO TIMES DAILY 200 each 3   ONETOUCH VERIO test strip USE TO CHECK BLOOD SUGAR TWO TIMES A DAY 200 strip 3   zonisamide  (ZONEGRAN ) 100 MG capsule Take 2 capsules (200 mg total) by mouth daily. 60 capsule 5   Facility-Administered Medications Prior to Visit  Medication Dose Route Frequency Provider Last Rate Last Admin   fidaxomicin  (DIFICID ) tablet 200 mg  200 mg Oral BID          Per HPI unless specifically indicated in ROS section below Review of Systems  Constitutional:  Negative for fatigue and fever.  HENT:  Negative for congestion.   Eyes:  Negative for pain.  Respiratory:  Negative for cough and shortness of breath.   Cardiovascular:  Negative for chest pain, palpitations and leg swelling.  Gastrointestinal:  Positive for abdominal pain.  Genitourinary:  Negative for dysuria and vaginal bleeding.  Musculoskeletal:  Negative for back pain.  Neurological:  Negative for syncope, light-headedness and headaches.  Psychiatric/Behavioral:  Negative for dysphoric mood.    Objective:  BP 128/84   Pulse 75   Temp 98.2 F (36.8 C) (Temporal)   Ht 5' (1.524 m)    Wt 120 lb 2 oz (54.5 kg)   SpO2 99%   BMI 23.46 kg/m   Wt Readings from Last 3 Encounters:  01/31/24 120 lb 2 oz (54.5 kg)  12/07/23 125 lb 6.4 oz (56.9 kg)  11/30/23 129 lb 12.8 oz (58.9 kg)      Physical Exam Constitutional:      General: She is not in acute distress.  Appearance: Normal appearance. She is well-developed. She is not ill-appearing or toxic-appearing.  HENT:     Head: Normocephalic.     Right Ear: Hearing, tympanic membrane, ear canal and external ear normal. Tympanic membrane is not erythematous, retracted or bulging.     Left Ear: Hearing, tympanic membrane, ear canal and external ear normal. Tympanic membrane is not erythematous, retracted or bulging.     Nose: No mucosal edema or rhinorrhea.     Right Sinus: No maxillary sinus tenderness or frontal sinus tenderness.     Left Sinus: No maxillary sinus tenderness or frontal sinus tenderness.     Mouth/Throat:     Pharynx: Uvula midline.  Eyes:     General: Lids are normal. Lids are everted, no foreign bodies appreciated.     Conjunctiva/sclera: Conjunctivae normal.     Pupils: Pupils are equal, round, and reactive to light.  Neck:     Thyroid : No thyroid  mass or thyromegaly.     Vascular: No carotid bruit.     Trachea: Trachea normal.  Cardiovascular:     Rate and Rhythm: Normal rate and regular rhythm.     Pulses: Normal pulses.     Heart sounds: Normal heart sounds, S1 normal and S2 normal. No murmur heard.    No friction rub. No gallop.  Pulmonary:     Effort: Pulmonary effort is normal. No tachypnea or respiratory distress.     Breath sounds: Normal breath sounds. No decreased breath sounds, wheezing, rhonchi or rales.  Abdominal:     General: Bowel sounds are normal.     Palpations: Abdomen is soft.     Tenderness: There is abdominal tenderness in the right upper quadrant and epigastric area. There is right CVA tenderness. There is no left CVA tenderness, guarding or rebound. Negative signs include  Murphy's sign.  Musculoskeletal:     Cervical back: Normal range of motion and neck supple.  Skin:    General: Skin is warm and dry.     Findings: No rash.  Neurological:     Mental Status: She is alert.  Psychiatric:        Mood and Affect: Mood is not anxious or depressed.        Speech: Speech normal.        Behavior: Behavior normal. Behavior is cooperative.        Thought Content: Thought content normal.        Judgment: Judgment normal.       Results for orders placed or performed in visit on 01/12/24  Calprotectin, Fecal   Collection Time: 01/12/24  9:10 AM   Specimen: Stool   Stool  Result Value Ref Range   Calprotectin, Fecal 1,840 (H) 0 - 120 ug/g  Clostridium Difficile by PCR   Collection Time: 01/12/24  9:10 AM   Specimen: Stool   Stool  Result Value Ref Range   Toxigenic C. Difficile by PCR Positive (A) Negative   *Note: Due to a large number of results and/or encounters for the requested time period, some results have not been displayed. A complete set of results can be found in Results Review.    Assessment and Plan  RUQ pain Assessment & Plan: Acute, new onset right upper quadrant/epigastric pain in patient with history of autoimmune pancreatitis and current C. difficile infection treated with fidoxomycin. Will evaluate with lab work for possible drug-induced hepatitis versus suggestion of gallstones or pancreatitis flare (previously patient has not had pancreatic pain with her autoimmune  pancreatitis) She is tapering off the antibiotics per GI to prepare for the fecal transplant and her symptoms seem to be improving some in the last 24 hours.  Consider right upper quadrant ultrasound to evaluate gallbladder function.  Return and ER precautions provided.   Orders: -     CBC with Differential/Platelet -     Comprehensive metabolic panel with GFR -     Lipase  Flank pain Assessment & Plan: Acute, not primary area of pain but noted on exam.  Will  check urine for infection or blood.  Patient with history of nephrolithiasis.     No follow-ups on file.   Greig Ring, MD

## 2024-01-31 NOTE — Telephone Encounter (Signed)
 Message left on the cell of rep Charmaine Public

## 2024-01-31 NOTE — Assessment & Plan Note (Signed)
 Acute, new onset right upper quadrant/epigastric pain in patient with history of autoimmune pancreatitis and current C. difficile infection treated with fidoxomycin. Will evaluate with lab work for possible drug-induced hepatitis versus suggestion of gallstones or pancreatitis flare (previously patient has not had pancreatic pain with her autoimmune pancreatitis) She is tapering off the antibiotics per GI to prepare for the fecal transplant and her symptoms seem to be improving some in the last 24 hours.  Consider right upper quadrant ultrasound to evaluate gallbladder function.  Return and ER precautions provided.

## 2024-02-01 LAB — URINE CULTURE
MICRO NUMBER:: 16884579
SPECIMEN QUALITY:: ADEQUATE

## 2024-02-03 ENCOUNTER — Encounter: Payer: Self-pay | Admitting: *Deleted

## 2024-02-03 NOTE — Telephone Encounter (Signed)
 Another message has been left for rep to call to discuss

## 2024-02-07 ENCOUNTER — Other Ambulatory Visit: Payer: Self-pay

## 2024-02-07 MED ORDER — VOWST PO CAPS: 4.0000 | ORAL_CAPSULE | Freq: Every day | ORAL | 0 refills | Status: AC

## 2024-02-07 NOTE — Telephone Encounter (Signed)
 Debra Leach, Let's try to get approval for [facility]  at this point. Thanks. GM

## 2024-02-07 NOTE — Telephone Encounter (Signed)
 Form has been sent to [facility]  to get pt prescription and assistance. Pt aware to check email for link.    Prescription has been sent as requested to [facility] [address] [place]  [phone] NCPDP [id number]

## 2024-02-07 NOTE — Telephone Encounter (Signed)
 Dr Wilhelmenia I have left several messages to have the reps contact me to discuss with no response. Do you want to proceed with [facility] ?

## 2024-02-08 ENCOUNTER — Ambulatory Visit
Admission: RE | Admit: 2024-02-08 | Discharge: 2024-02-08 | Disposition: A | Discharge status: Home / Self Care | Source: Ambulatory Visit | Attending: Family Medicine | Admitting: Family Medicine

## 2024-02-08 DIAGNOSIS — R1011 Right upper quadrant pain: Secondary | ICD-10-CM | POA: Diagnosis present

## 2024-02-09 ENCOUNTER — Ambulatory Visit: Payer: Self-pay | Admitting: Family Medicine

## 2024-02-09 ENCOUNTER — Other Ambulatory Visit: Payer: Self-pay | Admitting: Neurology

## 2024-02-10 ENCOUNTER — Ambulatory Visit

## 2024-02-10 ENCOUNTER — Other Ambulatory Visit: Payer: Self-pay

## 2024-02-10 DIAGNOSIS — Z23 Encounter for immunization: Secondary | ICD-10-CM

## 2024-02-10 MED ORDER — COVID-19 MRNA VAC-TRIS(PFIZER) 30 MCG/0.3ML IM SUSY: 0.3000 mL | PREFILLED_SYRINGE | Freq: Once | INTRAMUSCULAR | 0 refills | Status: AC

## 2024-02-10 NOTE — Telephone Encounter (Signed)
 Called and spoke with pt. She and husband are going out of the country and would like to get their updated COVID vaccines prior to trip.  Per patient [facility] [address] has the Pfizer vaccine.  Pharmacies are now requiring prescriptions for COVID vaccines as of 02/07/24. Order pended for signature.     Copied from CRM [id number]. Topic: General - Other >> Feb 10, 2024  4:04 PM Thersia BROCKS wrote: Reason for CRM: Patient called in wanting to speak to [facility] nurse would like a callback   [phone]

## 2024-02-13 ENCOUNTER — Encounter: Payer: Self-pay | Admitting: Neurology

## 2024-02-15 ENCOUNTER — Encounter: Payer: Self-pay | Admitting: Internal Medicine

## 2024-02-15 ENCOUNTER — Ambulatory Visit (INDEPENDENT_AMBULATORY_CARE_PROVIDER_SITE_OTHER): Admitting: Internal Medicine

## 2024-02-15 VITALS — BP 124/70 | Ht 60.0 in | Wt 120.0 lb

## 2024-02-15 DIAGNOSIS — Z794 Long term (current) use of insulin: Secondary | ICD-10-CM | POA: Diagnosis not present

## 2024-02-15 DIAGNOSIS — E1165 Type 2 diabetes mellitus with hyperglycemia: Secondary | ICD-10-CM | POA: Diagnosis not present

## 2024-02-15 LAB — POCT GLYCOSYLATED HEMOGLOBIN (HGB A1C): Hemoglobin A1C: 7.1 % — AB (ref 4.0–5.6)

## 2024-02-15 MED ORDER — REPAGLINIDE 2 MG PO TABS: ORAL_TABLET | ORAL | 3 refills | Status: DC

## 2024-02-15 NOTE — Patient Instructions (Addendum)
-   Lantus  10 units daily  - Change  Repaglinide  2 mg  before Breakfast,  2 mg  before Lunch  and 4 mg before Supper     HOW TO TREAT LOW BLOOD SUGARS (Blood sugar LESS THAN 70 MG/DL) Please follow the RULE OF 15 for the treatment of hypoglycemia treatment (when your (blood sugars are less than 70 mg/dL)   STEP 1: Take 15 grams of carbohydrates when your blood sugar is low, which includes:  3-4 GLUCOSE TABS  OR 3-4 OZ OF JUICE OR REGULAR SODA OR ONE TUBE OF GLUCOSE GEL    STEP 2: RECHECK blood sugar in 15 MINUTES STEP 3: If your blood sugar is still low at the 15 minute recheck --> then, go back to STEP 1 and treat AGAIN with another 15 grams of carbohydrates.

## 2024-02-15 NOTE — Progress Notes (Signed)
 Name: Debra Leach  MRN/ DOB: [id number], 12-16-1950   Age/ Sex: 73 y.o., female    PCP: Avelina Greig BRAVO, MD   Reason for Endocrinology Evaluation: Type 2 Diabetes Mellitus     Date of Initial Endocrinology Visit: 07/10/2021    PATIENT IDENTIFIER: [name] is a 73 y.o. female with a past medical history of DM, renal stones, Autoimmune pancreatitis . The patient presented for initial endocrinology clinic visit on 07/10/2021 for consultative assistance with her diabetes management.    HPI: Debra Leach was    Diagnosed with DM 2012 Prior Medications tried/Intolerance: Metformin - GI side effects .  Currently checking blood sugars 2 x / day Hemoglobin A1c has ranged from 6.7% in 2019, peaking at 9.5% in 2022.    Has limited diet due to migraine headaches she has to eat every 3 hours otherwise her headaches will worsen  She is on long-term glucocorticoids for autoimmune pancreatitis for a long term  started  on 08/2017   On her initial visit to our clinic she had an A1c of 9.5%, she is intolerant to Jardiance  due to recurrent UTIs.  We started her on repaglinide   She was also diagnosed with MNG a few years ago with last ultrasound 04/2020, thyroid  nodules did not meet criteria for further follow-up.  ADRENAL HISTORY: The patient has been on budesonide  for many years due to autoimmune pancreatitis.  She presented to the ED in March, 2024 with sepsis secondary to UTI and cellulitis and was noted to have a low serum cortisol which prompted cosyntropin  stimulation test, and she was discharged on hydrocortisone   Her cosyntropin  stimulation test  result is INVALID , due to chronic corticosteroid intake.  Patient does not need any additional glucocorticoids as she is already on budesonide    Patient was prescribed prandial insulin  per correction scale while on prednisone  in June, 2025   THYROID  HISTORY  In reviewing her records, patient has been noted with low TSH  at 0.13 u IU/mL with normal free T3 and T4 in 04/2023 .  She does have history of subcentimeter thyroid  nodules that did not meet criteria for follow-up.  Repeat TFT's normal 05/2023    SUBJECTIVE:   During the last visit (08/24/2023): A1c 7.6%    Today (02/15/24): Debra Leach is here for follow-up on diabetes management.  She checks her glucose multiple times daily.Has noted hypoglycemia, she is symptomatic with these episodes.    She continues to follow-up with GI for autoimmune pancreatitis, as well as recurrent C. difficile infection. 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 , no diarrhea at this time She has been noted with weight loss  She has chronic nausea  due to migraine which continues to improving Has vertigo like symptoms , follows with ENT and  neurology    HOME ENDOCRINE  REGIMEN: Lantus  12 units daily  Repaglinide  1 mg, 1 tabs before Breakfast, 2 mg before Lunch and 4 mg before Supper     Statin: INtolerant to crestor  ACE-I/ARB: yes Prior Diabetic Education: no    CONTINUOUS GLUCOSE MONITORING RECORD INTERPRETATION    Dates of Recording: 8/28-9/03/2024  Sensor description: Dexcom  Results statistics:   CGM use % of time 96  Average and SD 171/59  Time in range 59 %  % Time Above 180 32  % Time above 250 9  % Time Below target 0   Glycemic patterns summary: BGs are optimal overnight and fluctuate during the day   Hyperglycemic episodes: postprandial  Hypoglycemic  episodes occurred N/A  Overnight periods: optimal    DIABETIC COMPLICATIONS: Microvascular complications:  CKDIII Denies: Neuropathy, retinopathy Last eye exam: Completed   Macrovascular complications:   Denies: CAD, PVD, CVA   PAST HISTORY: Past Medical History:  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HCC)    Cataract    Chronic kidney disease    Clotting disorder (HCC)     Complication of anesthesia    difficult waking   Diabetes mellitus without complication (HCC)    Diet and exercise controlled   Diverticulosis    DVT (deep venous thrombosis) (HCC) 02/2019   3calf 2 right lung 1 left lung   DVT (deep venous thrombosis) (HCC) 2020   right leg   Dyspnea    GERD (gastroesophageal reflux disease)    Headache    Hepatic steatosis    Hiatal hernia    History of kidney stones    Hyperlipidemia    MVP (mitral valve prolapse)    history of   PONV (postoperative nausea and vomiting)    Pulmonary emboli (HCC) 03/01/2019   2 in the right; 1 in the left   Past Surgical History:  Past Surgical History:  Procedure Laterality Date   arm surgery  2012   left forearm fracture w/ plates pins screws   ARTERY BIOPSY Right 12/03/2021   Procedure: BIOPSY TEMPORAL ARTERY;  Surgeon: Marea Selinda RAMAN, MD;  Location: [facility];  Service: Vascular;  Laterality: Right;  Right temple   BREAST BIOPSY Left    BREAST SURGERY  2000   breast biopsy (benign)   CATARACT EXTRACTION Right 2005   CATARACT EXTRACTION Left 2010   LITHOTRIPSY  08/12/2008   stent placed bilaterally   PANCREAS BIOPSY  2021   PANCREAS BIOPSY  2019   PARTIAL HYSTERECTOMY     Both ovaries remain, vaginal, for mennorhagia   REPAIR EXTENSOR TENDON Right 06/30/2022   Procedure: Middle and ring finger sagittal band reconstruction;  Surgeon: Romona Harari, MD;  Location: [facility];  Service: Orthopedics;  Laterality: Right;   TENDON REPAIR Left 04/06/2023   Procedure: MIDDLE AND RING FINGER SAGITTAL BAND RECONSTRUCTION;  Surgeon: Romona Harari, MD;  Location: [facility];  Service: Orthopedics;  Laterality: Left;  regional120   TONSILLECTOMY     TUBAL LIGATION  1980    Social History:  reports that she quit smoking about 32 years ago. Her smoking use included cigarettes. She started smoking about 45 years ago. She has a 13 pack-year smoking history. She has never been exposed to tobacco  smoke. She has never used smokeless tobacco. She reports that she does not drink alcohol and does not use drugs. Family History:  Family History  Problem Relation Age of Onset   Bone cancer Mother    Hypertension Father    Mitral valve prolapse Father    Colon polyps Brother    Asthma Brother    Arthritis Brother    Nephrolithiasis Brother    Asthma Brother    COPD Brother    Breast cancer Maternal Aunt    Breast cancer Maternal Aunt    Colon cancer Neg Hx    Esophageal cancer Neg Hx    Inflammatory bowel disease Neg Hx    Liver disease Neg Hx    Pancreatic cancer Neg Hx    Rectal cancer Neg Hx    Stomach  cancer Neg Hx      HOME MEDICATIONS: Allergies as of 02/15/2024       Reactions   Clindamycin/lincomycin Anaphylaxis   Atenolol  Other (See Comments)   Migraine   Codeine Itching, Swelling   REACTION: Migraine   Epinephrine  Itching   Hydrocodone Other (See Comments)   Migraine   Ibuprofen Other (See Comments)   Migraine   Jardiance  [empagliflozin ] Other (See Comments)   Caused uti Yeast infection   Metronidazole  Swelling   Mouth and lips   Oxycodone Other (See Comments)   Migraine   Penicillins Itching, Swelling   ** Tolerates cephalosporins Has patient had a PCN reaction causing immediate rash, facial/tongue/throat swelling, SOB or lightheadedness with hypotension: No Has patient had a PCN reaction causing severe rash involving mucus membranes or skin necrosis: No Has patient had a PCN reaction that required hospitalization: No Has patient had a PCN reaction occurring within the last 10 years: No   Promethazine  Other (See Comments)   migraine   Propranolol  Other (See Comments)   Migraine   Sudafed [pseudoephedrine Hcl] Hives, Itching, Anxiety   Talwin [pentazocine] Other (See Comments)   Swelling and itching    Tramadol Other (See Comments)   Migraine   Vancomycin  Swelling   Swelling of Lips and Mouth   Crestor [rosuvastatin] Other (See Comments)    Muscle pain        Medication List        Accurate as of February 15, 2024 10:09 AM. If you have any questions, ask your nurse or doctor.          Accu-Chek FastClix Lancets Misc Check blood sugar twice daily   Accu-Chek Guide Me w/Device Kit Check blood sugar twice daily   Accu-Chek Guide Test test strip Generic drug: glucose blood Check blood sugar twice daily   aluminum-magnesium  hydroxide 200-200 MG/5ML suspension Take by mouth every 6 (six) hours as needed for indigestion. As needed   atorvastatin  80 MG tablet Commonly known as: LIPITOR  TAKE 1 TABLET BY MOUTH DAILY   budesonide  3 MG 24 hr capsule Commonly known as: ENTOCORT EC  TAKE 2 CAPSULES BY MOUTH DAILY.   colesevelam  625 MG tablet Commonly known as: WELCHOL  TAKE 3 TABLETS BY MOUTH 2 TIMES DAILY WITH A MEAL.   cyanocobalamin  1000 MCG tablet Commonly known as: VITAMIN B12 Take 1,000 mcg by mouth daily with breakfast.   Dexcom G7 Sensor Misc 1 Device by Does not apply route as directed.   Eliquis  2.5 MG Tabs tablet Generic drug: apixaban  TAKE 1 TABLET BY MOUTH 2 TIMES DAILY.   famotidine  10 MG tablet Commonly known as: PEPCID  Take 10 mg by mouth 2 (two) times daily.   ferrous gluconate  324 MG tablet Commonly known as: FERGON TAKE 1 TABLET (324 MG TOTAL) BY MOUTH DAILY WITH BREAKFAST.   fidaxomicin  200 MG Tabs tablet Commonly known as: DIFICID  Take 1 tablet (200 mg total) by mouth every other day.   gabapentin  300 MG capsule Commonly known as: NEURONTIN  TAKE 2 CAPSULES (600 MG TOTAL) BY MOUTH AT BEDTIME.   gabapentin  100 MG capsule Commonly known as: NEURONTIN  TAKE 1 CAPSULE BY MOUTH UP TO 3 TIMES DAILY AS NEEDED.   Insulin  Pen Needle 32G X 4 MM Misc 1 Device by Does not apply route in the morning, at noon, in the evening, and at bedtime.   Lantus  SoloStar 100 UNIT/ML Solostar Pen Generic drug: insulin  glargine Inject 12 Units into the skin daily.   meclizine  25 MG tablet Commonly  known as: ANTIVERT  TAKE 1 TABLET BY MOUTH 3 TIMES DAILY AS NEEDED FOR DIZZINESS.   Nasacort Allergy 24HR 55 MCG/ACT Aero nasal inhaler Generic drug: triamcinolone Place 2 sprays into the nose daily.   ondansetron  8 MG tablet Commonly known as: ZOFRAN  Take 1 tablet (8 mg total) by mouth every 8 (eight) hours as needed for nausea or vomiting.   pantoprazole  40 MG tablet Commonly known as: PROTONIX  TAKE 1 TABLET (40 MG TOTAL) BY MOUTH DAILY.   potassium chloride  SA 20 MEQ tablet Commonly known as: KLOR-CON  M TAKE 1 TABLET BY MOUTH DAILY.   repaglinide  1 MG tablet Commonly known as: PRANDIN  Take 1 tablet (1 mg total) by mouth daily before breakfast AND 2 tablets (2 mg total) daily before lunch AND 4 tablets (4 mg total) daily before supper.   Ubrelvy  100 MG Tabs Generic drug: Ubrogepant  TAKE 1 TABLET BY MOUTH AS NEEDED (MAY REPEAT DOSE AFTER 2 HOURS IF NEEDED. MAXIMUM 2 TABLETS IN 24 HOURS).   Vitamin D  (Ergocalciferol ) 1.25 MG (50000 UNIT) Caps capsule Commonly known as: DRISDOL  TAKE 1 CAPSULE BY MOUTH ONCE A WEEK.   Vyepti 100 MG/ML injection Generic drug: Eptinezumab-jjmr Inject 300 mg into the vein every 3 (three) months.   zonisamide  100 MG capsule Commonly known as: ZONEGRAN  Take 100 mg by mouth daily.         ALLERGIES: Allergies  Allergen Reactions   Clindamycin/Lincomycin Anaphylaxis   Atenolol  Other (See Comments)    Migraine   Codeine Itching and Swelling    REACTION: Migraine   Epinephrine  Itching   Hydrocodone Other (See Comments)    Migraine   Ibuprofen Other (See Comments)    Migraine   Jardiance  [Empagliflozin ] Other (See Comments)    Caused uti Yeast infection   Metronidazole  Swelling    Mouth and lips   Oxycodone Other (See Comments)    Migraine   Penicillins Itching and Swelling    ** Tolerates cephalosporins Has patient had a PCN reaction causing immediate rash, facial/tongue/throat swelling, SOB or lightheadedness with hypotension:  No Has patient had a PCN reaction causing severe rash involving mucus membranes or skin necrosis: No Has patient had a PCN reaction that required hospitalization: No Has patient had a PCN reaction occurring within the last 10 years: No     Promethazine  Other (See Comments)    migraine   Propranolol  Other (See Comments)    Migraine   Sudafed [Pseudoephedrine Hcl] Hives, Itching and Anxiety   Talwin [Pentazocine] Other (See Comments)    Swelling and itching    Tramadol Other (See Comments)    Migraine   Vancomycin  Swelling    Swelling of Lips and Mouth   Crestor [Rosuvastatin] Other (See Comments)    Muscle pain     REVIEW OF SYSTEMS: A comprehensive ROS was conducted with the patient and is negative except as per HPI    OBJECTIVE:   VITAL SIGNS: BP 124/70 (BP Location: Left Arm, Patient Position: Sitting, Cuff Size: Normal)   Ht 5' (1.524 m)   Wt 120 lb (54.4 kg)   BMI 23.44 kg/m    PHYSICAL EXAM:  General: Pt appears well and is in NAD  Lungs: Clear with good BS bilat   Heart: RRR   Extremities:  Lower extremities - no pretibial edema.  Neuro: MS is good with appropriate affect, pt is alert and Ox3    DM foot exam: 08/24/2023  The skin of the feet is intact without sores or ulcerations. The  pedal pulses are 2+ on right and 2+ on left. The sensation is intact to a screening 5.07, 10 gram monofilament bilaterally   DATA REVIEWED:  Lab Results  Component Value Date   HGBA1C 8.2 (H) 08/26/2023   HGBA1C 7.6 (A) 08/24/2023   HGBA1C 9.7 (A) 05/19/2023    Latest Reference Range & Units 01/31/24 10:39  Sodium 135 - 145 mEq/L 144  Potassium 3.5 - 5.1 mEq/L 3.6  Chloride 96 - 112 mEq/L 105  CO2 19 - 32 mEq/L 30  Glucose 70 - 99 mg/dL 899 (H)  BUN 6 - 23 mg/dL 11  Creatinine 9.59 - 8.79 mg/dL 9.18  Calcium  8.4 - 10.5 mg/dL 8.8  Alkaline Phosphatase 39 - 117 U/L 61  Albumin 3.5 - 5.2 g/dL 3.8  Lipase 88.9 - 40.9 U/L 10.0 (L)  AST 0 - 37 U/L 18  ALT 0 - 35 U/L  19  Total Protein 6.0 - 8.3 g/dL 6.8  Total Bilirubin 0.2 - 1.2 mg/dL 0.6  GFR >39.99 mL/min 72.36     Thyroid  Ultrasound 04/26/2023    Narrative & Impression  CLINICAL DATA:  Goiter.  Hoarseness   EXAM: THYROID  ULTRASOUND   TECHNIQUE: Ultrasound examination of the thyroid  gland and adjacent soft tissues was performed.   COMPARISON:  Prior thyroid  ultrasound 04/17/2020   FINDINGS: Parenchymal Echotexture: Mildly heterogenous   Isthmus: 0.3 cm   Right lobe: 4.7 x 1.0 x 1.4 cm   Left lobe: 4.2 x 1.2 x 1.3 cm   _________________________________________________________   Estimated total number of nodules >/= 1 cm: 3   Number of spongiform nodules >/=  2 cm not described below (TR1): 0   Number of mixed cystic and solid nodules >/= 1.5 cm not described below (TR2): 0   _________________________________________________________   Small heterogeneous thyroid  gland. Innumerable small cysts and nodules again noted throughout the gland.   No significant interval change in the size or appearance of any individual nodule dating back to 2021. as before, no individual nodule meets criteria to warrant biopsy or dedicated imaging surveillance.   IMPRESSION: No significant interval change in the size or appearance of the small, heterogeneous and multinodular thyroid  gland.     ASSESSMENT / PLAN / RECOMMENDATIONS:   1) Type 2 Diabetes Mellitus, Optimally  Controlled, With fluctuating GFR  - Most recent A1c of 7.1%. Goal A1c < 7.0 %.    -A1c continues to trend down -Patient is not able to follow a low-carb diet due to autoimmune pancreatitis,chronic GI symptoms, and migraine headaches which limits her dietary options.   -Our goal is to prevent hypoglycemia and severe hyperglycemia - Pt with hx of pancreatitis hence GLP-1 agonist and DPP 4 inhibitors as well as Tirzepatide are all contraindicated - Intolerant to Jardiance  due to recurrent genital infections  - Patient has  been noted with tight/hypoglycemia overnight, will decrease basal insulin  - She continues with postprandial hypoglycemia will increase breakfast time repaglinide   MEDICATIONS: -Decrease Lantus  10 units daily -Change Repaglinidie 2 mg, 1 tab before breakfast, 1 tab before Lunch , 2 tabs before supper  EDUCATION / INSTRUCTIONS: BG monitoring instructions: Patient is instructed to check her blood sugars 1 times a day, fasting. Call [facility] if: BG persistently < 70  I reviewed the Rule of 15 for the treatment of hypoglycemia in detail with the patient. Literature supplied.   2) Diabetic complications:  Eye: Does not have known diabetic retinopathy.  Neuro/ Feet: Does not have known diabetic peripheral neuropathy.  Renal: Patient does  have known baseline CKD. She is  on an ACEI/ARB at present.      Follow-up in 4 months      Signed electronically by: Stefano Redgie Butts, MD  [facility] Endocrinology  [facility] [address] [place], [place] [address] Phone: [phone] FAX: [phone]   CC: Avelina Greig BRAVO, MD [address] [place] [address] Phone: [phone]  Fax: [phone]    Return to Endocrinology clinic as below: Future Appointments  Date Time Provider Department Center  02/15/2024 10:10 AM Nathan Moctezuma, Donell Redgie, MD [facility] None  03/15/2024 11:10 AM Skeet Juliene SAUNDERS, DO [facility] None  03/19/2024  8:40 AM [facility] GV-1 [facility]  03/19/2024  9:00 AM [facility] MM GV-DEXA [facility]  08/02/2024  8:50 AM [facility] ANNUAL WELLNESS VISIT 1 [facility] [address]

## 2024-02-27 ENCOUNTER — Ambulatory Visit: Admitting: Neurology

## 2024-02-28 ENCOUNTER — Encounter: Payer: Self-pay | Admitting: Gastroenterology

## 2024-03-05 ENCOUNTER — Encounter: Payer: Self-pay | Admitting: Internal Medicine

## 2024-03-06 ENCOUNTER — Ambulatory Visit: Admitting: Neurology

## 2024-03-08 ENCOUNTER — Encounter: Payer: Self-pay | Admitting: Gastroenterology

## 2024-03-08 NOTE — Telephone Encounter (Signed)
 Will work on the form within the next week. GM

## 2024-03-09 ENCOUNTER — Encounter: Payer: Self-pay | Admitting: Gastroenterology

## 2024-03-09 NOTE — Telephone Encounter (Signed)
 Documentation completed. GM

## 2024-03-12 ENCOUNTER — Telehealth: Payer: Self-pay | Admitting: Family Medicine

## 2024-03-12 NOTE — Telephone Encounter (Signed)
 Pt's husband came by and dropped off from. Type of forms received: Ins form   Routed to: [employer] received by : Randall     Individual made aware of 3-5 business day turn around (Y/N): Y   Form completed and patient made aware of charges(Y/N):Y      Form location:  In provider's up front box

## 2024-03-12 NOTE — Telephone Encounter (Signed)
Form placed in Dr. Daphine Deutscher office in box to complete.

## 2024-03-14 NOTE — Progress Notes (Unsigned)
 In   NEUROLOGY FOLLOW UP OFFICE NOTE  Debra Leach [id number]  Assessment/Plan:   Migraine without aura, without status migrainosus, not intractable - overall improved. Vestibular migraine Ocular migraine Hypertension   Migraine prevention:  Vyepti 300mg  every 3 months; zonisamide  100mg  daily Migraine rescue:   Classic migraine:  Ubrelvy  with or without gabapentin  for severe, gabapentin  alone for less severe Ocular migraine:  gabapentin   Vestibular migraine:  She will start treating acutely with Ubrelvy , gabapentin  or both to see if aborts quicker. Zofran  for nausea Lifestyle modification: Limit use of pain relievers to no more than 9 days out of the month to prevent risk of rebound or medication-overuse headache. Diet modification/hydration/caffeine cessation Routine exercise Sleep hygiene Consider vitamins/supplements:  magnesium  citrate 400mg  daily, riboflavin 400mg  daily, CoQ10 100mg  three times daily Keep headache diary Follow up with PCP regarding blood pressure. Follow up 4 months.  Total time spent on today's visit was 47 minutes dedicated to this patient today, preparing to see patient, examining the patient, ordering tests and/or medications and counseling the patient, documenting clinical information in the EHR or other health record, independently interpreting results and communicating results to the patient/family, discussing treatment and goals, answering patient's questions and coordinating care.     Subjective:  Debra Leach is a 73 year old right-handed female with autoimmune pancreatitis, migraines, hyperlipidemia, diabetes, GERD, osteoarthritis, and history of MVP and renal stones who follows up for dizziness.     UPDATE: Increased zonisamide .  Migraine without aura: She has seen improvement overall. Over past 3 months, migraine frequency has decreased from 7% pain-free days and 93% migraine-days in June to 18% pain-free days and 82% migraine  days in September with reduced severity and reduced average duration from 5 to 8 hours in June to 4-5 hours in September.  Treats with Ubrelvy  with or without gabapentin .  Vestibular migraine: She hasn't been keeping track as she was unaware that it was likely migraine.   She will have episodes lasting for 3-4 days and then have several days symptom-free.  Notes improvement in the last month.  She did see ENT who ruled out peripheral etiology.  Vestibular rehab helps and she does do home ocular exercises if she has a flare up.  Ocular migraines: Infrequent.  If she has an aura, will take gabapentin  and quickly aborts.   Current medications: Rescue protocol:  For moderate - gabapentin  100mg , for severe- Ubrelvy  with or without gabapentin . Current NSAIDS/steroid: budesomide Current analgesics: None Current triptans: None Current ergotamine: None Current anti-emetic: Promethazine  12.5 mg Current muscle relaxants: None Current anti-anxiolytic: None Current sleep aide: None Current Antihypertensive medications: losartan -HCTZ Current Antidepressant medications: None Current Anticonvulsant medications: Gabapentin  600mg  at bedtime and 100mg  TID PRN Current anti-CGRP: Vyepti 300mg ; Ubrelvy  Current Vitamins/Herbal/Supplements: Riboflavin 400 mg daily, vitamin D  Current Antihistamines/Decongestants: meclizine  Other therapy: Cefaly, ice pack Other medication: Meclizine    Caffeine: 1 cup of coffee daily Alcohol: None Smoker: No Diet: Follows [employer] ImmunoCalm Dietary Program.  Hydrates. Exercise: When tolerated (knee problems).  She walks and bikes. Depression: No; Anxiety: No Other pain:  no Sleep hygiene: Improved.  Sleeps better now than every before. 100mg  during day as needed   HISTORY: Migraines: Onset: 73 years old Location:  Varies (unilateral either side, frontal-temporal, back of head) Quality:  Varies (stabbing, pounding, throbbing) Initial Intensity:  10/10 severe, otherwise  5-7/10 Aura:  Occurs off and on over the years and varies in semiology.  In early 1970s, black out.  In late 1980s, white out.  In  late 1990s, flashing lights and zigzag lines.  Since 2016, scintillating scotoma (occurs 1 to 2 times a month) Prodrome:  no Associated symptoms: Initially vomiting.  Sometimes nausea.  Photophobia, phonophobia.  Dizziness, speech difficulty. Initial Duration:  All day Initial Frequency:  daily Triggers: Change in weather, emotional stress, valsalva maneuver, odors, certain foods Relieving factors: ice, rest Activity:  Difficult to function if severe  She has been to the ED for migraines with speech disturbance.  Workup in March 2024 included MRI of brain without contrast and CTA head and neck which were unremarkable.     Past NSAIDS:  diclofenac  50mg , Cambia, Mobic  15mg , ibuprofen, naproxen, toradol  Past analgesics:  tramadol (reaction), Midrin, Excedrin Past abortive triptans/ergots:  Treximet, Axert, Amerge, Frova, Maxalt, Relpax, Zomig tablet, DHE NS, sumatriptan  100mg ; sumatriptan  6mg  [place] Past anti-emetic:  Zofran  4mg  Past anxiolytic:  Buspirone, clonazepam Past antihypertensive medications:  Metoprolol , Norvasc, propranolol  ER 120mg , atenolol  100mg  (side effects dizziness, myalgias, acid reflux) Past antidepressant medications:  Venlafaxine, sertraline, amitriptyline, amoxapine, duloxetine, nortriptyline, imipramine, Luvox, Remeron, Serzone, bupropion, desipramine, doxepin, Vivactil Past anticonvulsant medications:  Depakote, Topamax, zonisamide , Lamictal, Keppra, Lyrica, gabapentin  300/300/600  Past CGRP inhibitor: Aimovig , Emgality , Nurtec PRN (quick onset but doesn't last) Past vitamins/Herbal/Supplements:  butterbur, Mg, CoQ10 Other past treatments:  Botox , Cefaly Device, methylergonovine, acupuncture, biofeedback, Seroquel, Thorazine, methylsergide maleate   Family history of headache:  Maybe her mother's side.   Prior brain MRI normal (date unknown  but report is mentioned in prior notes).   Vestibular migraine: Began having dizziness in early 2022.  No preceding head injury or new medication.  Reports episodic dizziness described as a wave of lightheadedness or sensation of stepping off of a boat, but not spinning.  It occurs with any movement but also can occur spontaneously.  Typically lasts 15 seconds but occurs all day long.  It doesn't occur while laying in bed, such as turning over.  No headache but feels like her head is floating.  No visual disturbance.  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  It does not appear to correlate with a migraine flare.  She reports history of mild orthostatic hypotension when getting up too fast, but not experiencing that with this and reportedly orthostatic vitals were negative when checked by her PCP.  Flonase didn't help, however meclizine  is effective.  She followed up with ENT.  Dix-Hallpike was negative.  VNG showed abnormal smooth pursuit, saccades and optokinetic testing - central pathology cannot be ruled out.  She has bilateral sensorineural hearing loss.  Hearing aids were recommended.  Underwent workup for possible central dizziness.  MRI and MRA of brain on 12/23/2020 were unremarkable.  Vestibular rehab effective.  Ocular migraine: In April 2023, she began having episodes of monocular dimming of vision in the right eye lasting 15 minutes.  No associated headache.  Different than her previous ocular migraines presenting as pixel vision.  Saw her ophthalmologist and eye exam was normal.  Sed rate on 5/12 was 102 and repeat on 5/17 was 130.  She was hospitalized on 11/02/2021 for syncope in setting of AKI and flu.  She was placed on 5 day course of prednsione.  Sed rate on day of admission was 60.  Sed rate in May 2023 was over 130.  Carotid ultrasound on 6/15 was negative for hemodynamically significant stenosis.   Underwent temporal artery biopsy on 6/29 which was negative.  Repeat eye exam  on 12/09/2021 was normal.    Abnormal Movements: In December 2017, we started atenolol  for migraine prevention.  It worked well, but due to reported side effects of dizziness, acid reflux and joint and muscle pain, she was tapered off of it in early April.  Around that time, she developed shaking episodes or tremors associated with her migraines.  She followed up with her PCP, Dr. Avelina, on 10/08/16 and we had her discontinue the gabapentin .  She hasn't had any recurrent tremors, however she says she has gone up to 4 days at a time without an attack.  Since discontinuing the gabapentin , the intensity of her daily headaches have gotten worse.     I reviewed the video of her habitual attacks.  In the video, she exhibits flapping of her right hand, arrhythmic and without tremor or choreiform movement.  It is not an arrhythmic jerking, consistent with myoclonus.  It sometimes involves the left hand as well.  She denies stress and anxiety.    PAST MEDICAL HISTORY: 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HCC)    Cataract    Chronic kidney disease    Clotting disorder    Complication of anesthesia    difficult waking   Diabetes mellitus without complication (HCC)    Diet and exercise controlled   Diverticulosis    DVT (deep venous thrombosis) (HCC) 02/2019   3calf 2 right lung 1 left lung   DVT (deep venous thrombosis) (HCC) 2020   right leg   Dyspnea    GERD (gastroesophageal reflux disease)    Headache    Hepatic steatosis    Hiatal hernia    History of kidney stones    Hyperlipidemia    MVP (mitral valve prolapse)    history of   PONV (postoperative nausea and vomiting)    Pulmonary emboli (HCC) 03/01/2019   2 in the right; 1 in the left    MEDICATIONS: Current Outpatient Medications on File Prior to Visit   Medication Sig Dispense Refill   Accu-Chek FastClix Lancets MISC Check blood sugar twice daily 102 each 3   aluminum-magnesium  hydroxide 200-200 MG/5ML suspension Take by mouth every 6 (six) hours as needed for indigestion. As needed     apixaban  (ELIQUIS ) 2.5 MG TABS tablet TAKE 1 TABLET BY MOUTH 2 TIMES DAILY. 180 tablet 3   atorvastatin  (LIPITOR ) 80 MG tablet TAKE 1 TABLET BY MOUTH DAILY 90 tablet 3   Blood Glucose Monitoring Suppl (ACCU-CHEK GUIDE ME) w/Device KIT Check blood sugar twice daily 1 kit 0   budesonide  (ENTOCORT EC ) 3 MG 24 hr capsule TAKE 2 CAPSULES BY MOUTH DAILY. 180 capsule 12   colesevelam  (WELCHOL ) 625 MG tablet TAKE 3 TABLETS BY MOUTH 2 TIMES DAILY WITH A MEAL. 540 tablet 3   Continuous Glucose Sensor (DEXCOM G7 SENSOR) MISC 1 Device by Does not apply route as directed. 9 each 3   Eptinezumab-jjmr (VYEPTI) 100 MG/ML injection Inject 300 mg into the vein every 3 (three) months.     famotidine  (PEPCID ) 10 MG tablet Take 10 mg by mouth 2 (two) times daily.     ferrous gluconate  (FERGON) 324 MG tablet TAKE 1 TABLET (324 MG TOTAL) BY MOUTH DAILY WITH BREAKFAST. 90 tablet 1   fidaxomicin  (DIFICID ) 200 MG TABS tablet Take 1 tablet (200 mg total) by mouth every other day. 20 tablet 0  gabapentin  (NEURONTIN ) 100 MG capsule TAKE 1 CAPSULE BY MOUTH UP TO 3 TIMES DAILY AS NEEDED. 270 capsule 3   gabapentin  (NEURONTIN ) 300 MG capsule TAKE 2 CAPSULES (600 MG TOTAL) BY MOUTH AT BEDTIME. 180 capsule 1   glucose blood (ACCU-CHEK GUIDE TEST) test strip Check blood sugar twice daily 100 each 12   insulin  glargine (LANTUS  SOLOSTAR) 100 UNIT/ML Solostar Pen Inject 12 Units into the skin daily.     Insulin  Pen Needle 32G X 4 MM MISC 1 Device by Does not apply route in the morning, at noon, in the evening, and at bedtime. 400 each 3   meclizine  (ANTIVERT ) 25 MG tablet TAKE 1 TABLET BY MOUTH 3 TIMES DAILY AS NEEDED FOR DIZZINESS. 30 tablet 1   ondansetron  (ZOFRAN ) 8 MG tablet Take 1 tablet (8 mg  total) by mouth every 8 (eight) hours as needed for nausea or vomiting. 30 tablet 1   pantoprazole  (PROTONIX ) 40 MG tablet TAKE 1 TABLET (40 MG TOTAL) BY MOUTH DAILY. 30 tablet 3   potassium chloride  SA (KLOR-CON  M) 20 MEQ tablet TAKE 1 TABLET BY MOUTH DAILY. 90 tablet 3   repaglinide  (PRANDIN ) 2 MG tablet Take 2 mg with breakfast, 2 mg with lunch, and 4 mg with supper 360 tablet 3   triamcinolone (NASACORT ALLERGY 24HR) 55 MCG/ACT AERO nasal inhaler Place 2 sprays into the nose daily.     UBRELVY  100 MG TABS TAKE 1 TABLET BY MOUTH AS NEEDED (MAY REPEAT DOSE AFTER 2 HOURS IF NEEDED. MAXIMUM 2 TABLETS IN 24 HOURS). 10 tablet 3   vitamin B-12 (CYANOCOBALAMIN ) 1000 MCG tablet Take 1,000 mcg by mouth daily with breakfast.     Vitamin D , Ergocalciferol , (DRISDOL ) 1.25 MG (50000 UNIT) CAPS capsule TAKE 1 CAPSULE BY MOUTH ONCE A WEEK. 12 capsule 3   zonisamide  (ZONEGRAN ) 100 MG capsule Take 100 mg by mouth daily.     Current Facility-Administered Medications on File Prior to Visit  Medication Dose Route Frequency Provider Last Rate Last Admin   fidaxomicin  (DIFICID ) tablet 200 mg  200 mg Oral BID         ALLERGIES: Allergies  Allergen Reactions   Clindamycin/Lincomycin Anaphylaxis   Atenolol  Other (See Comments)    Migraine   Codeine Itching and Swelling    REACTION: Migraine   Epinephrine  Itching   Hydrocodone Other (See Comments)    Migraine   Ibuprofen Other (See Comments)    Migraine   Jardiance  [Empagliflozin ] Other (See Comments)    Caused uti Yeast infection   Metronidazole  Swelling    Mouth and lips   Oxycodone Other (See Comments)    Migraine   Penicillins Itching and Swelling    ** Tolerates cephalosporins Has patient had a PCN reaction causing immediate rash, facial/tongue/throat swelling, SOB or lightheadedness with hypotension: No Has patient had a PCN reaction causing severe rash involving mucus membranes or skin necrosis: No Has patient had a PCN reaction that required  hospitalization: No Has patient had a PCN reaction occurring within the last 10 years: No     Promethazine  Other (See Comments)    migraine   Propranolol  Other (See Comments)    Migraine   Sudafed [Pseudoephedrine Hcl] Hives, Itching and Anxiety   Talwin [Pentazocine] Other (See Comments)    Swelling and itching    Tramadol Other (See Comments)    Migraine   Vancomycin  Swelling    Swelling of Lips and Mouth   Crestor [Rosuvastatin] Other (See Comments)  Muscle pain    FAMILY HISTORY: Family History  Problem Relation Age of Onset   Bone cancer Mother    Hypertension Father    Mitral valve prolapse Father    Colon polyps Brother    Asthma Brother    Arthritis Brother    Nephrolithiasis Brother    Asthma Brother    COPD Brother    Breast cancer Maternal Aunt    Breast cancer Maternal Aunt    Colon cancer Neg Hx    Esophageal cancer Neg Hx    Inflammatory bowel disease Neg Hx    Liver disease Neg Hx    Pancreatic cancer Neg Hx    Rectal cancer Neg Hx    Stomach cancer Neg Hx       Objective:  Blood pressure (!) 167/77, pulse 84, height 4' 11 (1.499 m), weight 117 lb (53.1 kg), SpO2 97%. General: No acute distress.  Patient appears well-groomed.   Head:  Normocephalic/atraumatic Neck:  Supple.  No paraspinal tenderness.  Full range of motion. Heart:  Regular rate and rhythm. Neuro:  Alert and oriented.  Speech fluent and not dysarthric.  Language intact.  CN II-XII intact.  Bulk and tone normal.  Muscle strength 5/5 throughout.  Sensation to light touch intact.  Deep tendon reflexes 2+ throughout, toes downgoing.  Gait normal.  Romberg negative.         Juliene Dunnings, DO  CC: Greig Ring, MD

## 2024-03-15 ENCOUNTER — Ambulatory Visit: Admitting: Neurology

## 2024-03-15 VITALS — BP 167/77 | HR 84 | Ht 59.0 in | Wt 117.0 lb

## 2024-03-15 DIAGNOSIS — G43009 Migraine without aura, not intractable, without status migrainosus: Secondary | ICD-10-CM

## 2024-03-15 NOTE — Patient Instructions (Addendum)
 Vyepti every 3 months Zonisamide  100mg  daily Ubrelvy  and/or gabapentin  and Zofran  as needed.

## 2024-03-16 ENCOUNTER — Encounter: Payer: Self-pay | Admitting: Neurology

## 2024-03-19 ENCOUNTER — Ambulatory Visit
Admission: RE | Admit: 2024-03-19 | Discharge: 2024-03-19 | Disposition: A | Discharge status: Home / Self Care | Source: Ambulatory Visit | Attending: Family Medicine | Admitting: Family Medicine

## 2024-03-19 DIAGNOSIS — M810 Age-related osteoporosis without current pathological fracture: Secondary | ICD-10-CM | POA: Insufficient documentation

## 2024-03-19 DIAGNOSIS — Z13820 Encounter for screening for osteoporosis: Secondary | ICD-10-CM | POA: Insufficient documentation

## 2024-03-19 DIAGNOSIS — Z1231 Encounter for screening mammogram for malignant neoplasm of breast: Secondary | ICD-10-CM

## 2024-03-19 DIAGNOSIS — Z780 Asymptomatic menopausal state: Secondary | ICD-10-CM | POA: Insufficient documentation

## 2024-03-19 DIAGNOSIS — M8580 Other specified disorders of bone density and structure, unspecified site: Secondary | ICD-10-CM | POA: Insufficient documentation

## 2024-03-20 ENCOUNTER — Encounter: Payer: Self-pay | Admitting: Neurology

## 2024-03-21 ENCOUNTER — Other Ambulatory Visit: Payer: Self-pay | Admitting: Family Medicine

## 2024-03-21 ENCOUNTER — Encounter: Payer: Self-pay | Admitting: Family Medicine

## 2024-03-21 ENCOUNTER — Ambulatory Visit: Payer: Self-pay | Admitting: Family Medicine

## 2024-03-21 DIAGNOSIS — Z0279 Encounter for issue of other medical certificate: Secondary | ICD-10-CM

## 2024-03-21 DIAGNOSIS — M810 Age-related osteoporosis without current pathological fracture: Secondary | ICD-10-CM

## 2024-03-22 ENCOUNTER — Encounter: Payer: Self-pay | Admitting: Family Medicine

## 2024-03-29 ENCOUNTER — Ambulatory Visit (INDEPENDENT_AMBULATORY_CARE_PROVIDER_SITE_OTHER): Admitting: Family Medicine

## 2024-03-29 VITALS — BP 140/70 | HR 73 | Temp 98.2°F | Ht 60.0 in | Wt 117.2 lb

## 2024-03-29 DIAGNOSIS — D508 Other iron deficiency anemias: Secondary | ICD-10-CM | POA: Diagnosis not present

## 2024-03-29 DIAGNOSIS — Z7952 Long term (current) use of systemic steroids: Secondary | ICD-10-CM | POA: Diagnosis not present

## 2024-03-29 DIAGNOSIS — M810 Age-related osteoporosis without current pathological fracture: Secondary | ICD-10-CM

## 2024-03-29 DIAGNOSIS — K219 Gastro-esophageal reflux disease without esophagitis: Secondary | ICD-10-CM | POA: Diagnosis not present

## 2024-03-29 MED ORDER — DENOSUMAB 60 MG/ML ~~LOC~~ SOSY: 60.0000 mg | PREFILLED_SYRINGE | Freq: Once | SUBCUTANEOUS | 0 refills | Status: DC

## 2024-03-29 MED ORDER — ALENDRONATE SODIUM 70 MG PO TABS: 70.0000 mg | ORAL_TABLET | ORAL | 11 refills | Status: DC

## 2024-03-29 MED ORDER — FERROUS GLUCONATE 324 (38 FE) MG PO TABS
324.0000 mg | ORAL_TABLET | Freq: Every day | ORAL | 3 refills | Status: AC
Start: 2024-03-29 — End: ?

## 2024-03-29 NOTE — Addendum Note (Signed)
 Addended by: WENDELL ARLAND RAMAN on: 03/29/2024 03:35 PM   Modules accepted: Orders

## 2024-03-29 NOTE — Progress Notes (Signed)
 Patient ID: Debra Leach, female    DOB: [birth year], 73 y.o.   MRN: [id number]  This visit was conducted in person.  BP (!) 140/70   Pulse 73   Temp 98.2 F (36.8 C) (Temporal)   Ht 5' (1.524 m)   Wt 117 lb 4 oz (53.2 kg)   SpO2 99%   BMI 22.90 kg/m    CC:  Chief Complaint  Patient presents with   Osteoporosis    Discuss Treatment Options    Subjective:   HPI: Debra Leach is a 73 y.o. female presenting on 03/29/2024 for Osteoporosis (Discuss Treatment Options)  New diagnosis of osteoporosis.  DEXA 03/07/2024 right femoral neck T-2.6   On longterm budesonide .   History of CKD, GFR currently 72    History of cdiff.. now resolved.   Does have history of  GERD.. on pantoprazole  0 mg daily, also on famotidine .   No compression fractures.   On VItD 50,000 international unit  weekly   Next visit with Dr., Sam, ENDO in 06/2023.  Relevant past medical, surgical, family and social history reviewed and updated as indicated. Interim medical history since our last visit reviewed. Allergies and medications reviewed and updated. Outpatient Medications Prior to Visit  Medication Sig Dispense Refill   Accu-Chek FastClix Lancets MISC Check blood sugar twice daily 102 each 3   aluminum-magnesium  hydroxide 200-200 MG/5ML suspension Take by mouth every 6 (six) hours as needed for indigestion. As needed     apixaban  (ELIQUIS ) 2.5 MG TABS tablet TAKE 1 TABLET BY MOUTH 2 TIMES DAILY. 180 tablet 3   atorvastatin  (LIPITOR ) 80 MG tablet TAKE 1 TABLET BY MOUTH DAILY 90 tablet 3   Blood Glucose Monitoring Suppl (ACCU-CHEK GUIDE ME) w/Device KIT Check blood sugar twice daily 1 kit 0   budesonide  (ENTOCORT EC ) 3 MG 24 hr capsule TAKE 2 CAPSULES BY MOUTH DAILY. 180 capsule 12   colesevelam  (WELCHOL ) 625 MG tablet TAKE 3 TABLETS BY MOUTH 2 TIMES DAILY WITH A MEAL. 540 tablet 3   Continuous Glucose Sensor (DEXCOM G7 SENSOR) MISC 1 Device by Does not apply route as  directed. 9 each 3   Eptinezumab-jjmr (VYEPTI) 100 MG/ML injection Inject 300 mg into the vein every 3 (three) months.     famotidine  (PEPCID ) 10 MG tablet Take 10 mg by mouth 2 (two) times daily.     ferrous gluconate  (FERGON) 324 MG tablet TAKE 1 TABLET (324 MG TOTAL) BY MOUTH DAILY WITH BREAKFAST. 90 tablet 1   gabapentin  (NEURONTIN ) 100 MG capsule TAKE 1 CAPSULE BY MOUTH UP TO 3 TIMES DAILY AS NEEDED. 270 capsule 3   gabapentin  (NEURONTIN ) 300 MG capsule TAKE 2 CAPSULES (600 MG TOTAL) BY MOUTH AT BEDTIME. 180 capsule 1   glucose blood (ACCU-CHEK GUIDE TEST) test strip Check blood sugar twice daily 100 each 12   insulin  glargine (LANTUS  SOLOSTAR) 100 UNIT/ML Solostar Pen Inject 10 Units into the skin daily.     Insulin  Pen Needle 32G X 4 MM MISC 1 Device by Does not apply route in the morning, at noon, in the evening, and at bedtime. 400 each 3   meclizine  (ANTIVERT ) 25 MG tablet TAKE 1 TABLET BY MOUTH 3 TIMES DAILY AS NEEDED FOR DIZZINESS. 30 tablet 1   ondansetron  (ZOFRAN ) 8 MG tablet Take 1 tablet (8 mg total) by mouth every 8 (eight) hours as needed for nausea or vomiting. 30 tablet 1   pantoprazole  (PROTONIX ) 40 MG tablet TAKE 1 TABLET (  40 MG TOTAL) BY MOUTH DAILY. 30 tablet 3   potassium chloride  SA (KLOR-CON  M) 20 MEQ tablet TAKE 1 TABLET BY MOUTH DAILY. 90 tablet 3   repaglinide  (PRANDIN ) 2 MG tablet Take 2 mg with breakfast, 2 mg with lunch, and 4 mg with supper 360 tablet 3   triamcinolone (NASACORT ALLERGY 24HR) 55 MCG/ACT AERO nasal inhaler Place 2 sprays into the nose daily.     UBRELVY  100 MG TABS TAKE 1 TABLET BY MOUTH AS NEEDED (MAY REPEAT DOSE AFTER 2 HOURS IF NEEDED. MAXIMUM 2 TABLETS IN 24 HOURS). 10 tablet 3   vitamin B-12 (CYANOCOBALAMIN ) 1000 MCG tablet Take 1,000 mcg by mouth daily with breakfast.     Vitamin D , Ergocalciferol , (DRISDOL ) 1.25 MG (50000 UNIT) CAPS capsule TAKE 1 CAPSULE BY MOUTH ONCE A WEEK. 12 capsule 3   zonisamide  (ZONEGRAN ) 100 MG capsule Take 100 mg by  mouth daily.     fidaxomicin  (DIFICID ) 200 MG TABS tablet Take 1 tablet (200 mg total) by mouth every other day. 20 tablet 0   Facility-Administered Medications Prior to Visit  Medication Dose Route Frequency Provider Last Rate Last Admin   fidaxomicin  (DIFICID ) tablet 200 mg  200 mg Oral BID          Per HPI unless specifically indicated in ROS section below Review of Systems  Constitutional:  Negative for fatigue and fever.  HENT:  Negative for congestion.   Eyes:  Negative for pain.  Respiratory:  Negative for cough and shortness of breath.   Cardiovascular:  Negative for chest pain, palpitations and leg swelling.  Gastrointestinal:  Negative for abdominal pain.  Genitourinary:  Negative for dysuria and vaginal bleeding.  Musculoskeletal:  Negative for back pain.  Neurological:  Negative for syncope, light-headedness and headaches.  Psychiatric/Behavioral:  Negative for dysphoric mood.    Objective:  BP (!) 140/70   Pulse 73   Temp 98.2 F (36.8 C) (Temporal)   Ht 5' (1.524 m)   Wt 117 lb 4 oz (53.2 kg)   SpO2 99%   BMI 22.90 kg/m   Wt Readings from Last 3 Encounters:  03/29/24 117 lb 4 oz (53.2 kg)  03/15/24 117 lb (53.1 kg)  02/15/24 120 lb (54.4 kg)      Physical Exam Constitutional:      General: She is not in acute distress.    Appearance: Normal appearance. She is well-developed. She is not ill-appearing or toxic-appearing.  HENT:     Head: Normocephalic.     Right Ear: Hearing normal. Tympanic membrane is not erythematous, retracted or bulging.     Left Ear: Hearing normal. Tympanic membrane is not erythematous, retracted or bulging.     Nose: No mucosal edema or rhinorrhea.     Right Sinus: No maxillary sinus tenderness or frontal sinus tenderness.     Left Sinus: No maxillary sinus tenderness or frontal sinus tenderness.     Mouth/Throat:     Pharynx: Uvula midline.  Eyes:     General: Lids are normal. Lids are everted, no foreign bodies appreciated.      Conjunctiva/sclera: Conjunctivae normal.     Pupils: Pupils are equal, round, and reactive to light.  Neck:     Thyroid : No thyroid  mass or thyromegaly.     Vascular: No carotid bruit.     Trachea: Trachea normal.  Cardiovascular:     Rate and Rhythm: Normal rate and regular rhythm.     Pulses: Normal pulses.  Heart sounds: Normal heart sounds, S1 normal and S2 normal. No murmur heard.    No friction rub. No gallop.  Pulmonary:     Effort: Pulmonary effort is normal. No tachypnea or respiratory distress.     Breath sounds: Normal breath sounds. No decreased breath sounds, wheezing, rhonchi or rales.  Abdominal:     General: Bowel sounds are normal.     Palpations: Abdomen is soft.     Tenderness: There is no abdominal tenderness.  Musculoskeletal:     Cervical back: Normal range of motion and neck supple.  Skin:    General: Skin is warm and dry.     Findings: No rash.  Neurological:     Mental Status: She is alert.  Psychiatric:        Mood and Affect: Mood is not anxious or depressed.        Speech: Speech normal.        Behavior: Behavior normal. Behavior is cooperative.        Thought Content: Thought content normal.        Judgment: Judgment normal.       Results for orders placed or performed in visit on 02/15/24  POCT glycosylated hemoglobin (Hb A1C)   Collection Time: 02/15/24 10:09 AM  Result Value Ref Range   Hemoglobin A1C 7.1 (A) 4.0 - 5.6 %   HbA1c POC (<> result, manual entry)     HbA1c, POC (prediabetic range)     HbA1c, POC (controlled diabetic range)     *Note: Due to a large number of results and/or encounters for the requested time period, some results have not been displayed. A complete set of results can be found in Results Review.    Assessment and Plan  Age-related osteoporosis without current pathological fracture Assessment & Plan: New diagnosis, patient will be on long-term budesonide  so significant increased risk in worsening. Discussed  options with patient in detail.  We will start a trial of Fosamax 70 mg weekly but I am concerned that she may not tolerate this given her history of reflux.  Reflux is currently well-controlled with pantoprazole  and famotidine  daily.  I will contact her endocrinologist Dr. Randell for for possible additional recommendations.  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  Reclast may be a better option for the patient.    Gastroesophageal reflux disease without esophagitis  Current chronic use of systemic steroids  Other orders -     Alendronate Sodium; Take 1 tablet (70 mg total) by mouth every 7 (seven) days. Take with a full glass of water on an empty stomach.  Dispense: 4 tablet; Refill: 11    No follow-ups on file.   Greig Ring, MD

## 2024-03-29 NOTE — Assessment & Plan Note (Signed)
 New diagnosis, patient will be on long-term budesonide  so significant increased risk in worsening. Discussed options with patient in detail.  We will start a trial of Fosamax 70 mg weekly but I am concerned that she may not tolerate this given her history of reflux.  Reflux is currently well-controlled with pantoprazole  and famotidine  daily.  I will contact her endocrinologist Dr. Randell for for possible additional recommendations.  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  Reclast may be a better option for the patient.

## 2024-03-30 ENCOUNTER — Emergency Department

## 2024-03-30 ENCOUNTER — Encounter: Payer: Self-pay | Admitting: *Deleted

## 2024-03-30 ENCOUNTER — Other Ambulatory Visit: Payer: Self-pay

## 2024-03-30 ENCOUNTER — Emergency Department
Admission: EM | Admit: 2024-03-30 | Discharge: 2024-03-30 | Disposition: A | Discharge status: Home / Self Care | Attending: Emergency Medicine | Admitting: Emergency Medicine

## 2024-03-30 ENCOUNTER — Encounter: Payer: Self-pay | Admitting: Family Medicine

## 2024-03-30 ENCOUNTER — Encounter: Payer: Self-pay | Admitting: Neurology

## 2024-03-30 DIAGNOSIS — I6789 Other cerebrovascular disease: Secondary | ICD-10-CM | POA: Insufficient documentation

## 2024-03-30 DIAGNOSIS — M47816 Spondylosis without myelopathy or radiculopathy, lumbar region: Secondary | ICD-10-CM | POA: Insufficient documentation

## 2024-03-30 DIAGNOSIS — N189 Chronic kidney disease, unspecified: Secondary | ICD-10-CM | POA: Insufficient documentation

## 2024-03-30 DIAGNOSIS — M810 Age-related osteoporosis without current pathological fracture: Secondary | ICD-10-CM | POA: Diagnosis not present

## 2024-03-30 DIAGNOSIS — K219 Gastro-esophageal reflux disease without esophagitis: Secondary | ICD-10-CM | POA: Insufficient documentation

## 2024-03-30 DIAGNOSIS — M6281 Muscle weakness (generalized): Secondary | ICD-10-CM | POA: Diagnosis present

## 2024-03-30 DIAGNOSIS — R29898 Other symptoms and signs involving the musculoskeletal system: Secondary | ICD-10-CM

## 2024-03-30 LAB — CBC WITH DIFFERENTIAL/PLATELET
Abs Immature Granulocytes: 0.04 K/uL (ref 0.00–0.07)
Basophils Absolute: 0.1 K/uL (ref 0.0–0.1)
Basophils Relative: 1 %
Eosinophils Absolute: 0.1 K/uL (ref 0.0–0.5)
Eosinophils Relative: 1 %
HCT: 46.2 % — ABNORMAL HIGH (ref 36.0–46.0)
Hemoglobin: 14.4 g/dL (ref 12.0–15.0)
Immature Granulocytes: 0 %
Lymphocytes Relative: 13 %
Lymphs Abs: 1.5 K/uL (ref 0.7–4.0)
MCH: 29.2 pg (ref 26.0–34.0)
MCHC: 31.2 g/dL (ref 30.0–36.0)
MCV: 93.7 fL (ref 80.0–100.0)
Monocytes Absolute: 0.6 K/uL (ref 0.1–1.0)
Monocytes Relative: 5 %
Neutro Abs: 9 K/uL — ABNORMAL HIGH (ref 1.7–7.7)
Neutrophils Relative %: 80 %
Platelets: 307 K/uL (ref 150–400)
RBC: 4.93 MIL/uL (ref 3.87–5.11)
RDW: 14.5 % (ref 11.5–15.5)
WBC: 11.3 K/uL — ABNORMAL HIGH (ref 4.0–10.5)
nRBC: 0 % (ref 0.0–0.2)

## 2024-03-30 LAB — BASIC METABOLIC PANEL WITH GFR
Anion gap: 11 (ref 5–15)
BUN: 13 mg/dL (ref 8–23)
CO2: 23 mmol/L (ref 22–32)
Calcium: 8.9 mg/dL (ref 8.9–10.3)
Chloride: 109 mmol/L (ref 98–111)
Creatinine, Ser: 0.83 mg/dL (ref 0.44–1.00)
GFR, Estimated: >60 mL/min (ref >60)
Glucose, Bld: 102 mg/dL — ABNORMAL HIGH (ref 70–99)
Potassium: 3.1 mmol/L — ABNORMAL LOW (ref 3.5–5.1)
Sodium: 143 mmol/L (ref 135–145)

## 2024-03-30 NOTE — ED Notes (Signed)
Pt back in bed and placed back on monitor.

## 2024-03-30 NOTE — Telephone Encounter (Signed)
 Yes they do Reclast infusions and I have placed the referral form in your in box to complete.

## 2024-03-30 NOTE — Progress Notes (Signed)
 Mrs. Constable notified of Dr.Shamleffer recommendations for Osteoporosis treatment via MyChart.

## 2024-03-30 NOTE — ED Triage Notes (Signed)
 First nurse note: pt to ED for intermittent left leg weakness, reports happened once last week and again today. Ambulatory, steady gait at this time. States when this happens left leg drags and cannot pick it up .

## 2024-03-30 NOTE — ED Notes (Signed)
 Pt taken to CT.

## 2024-03-30 NOTE — ED Notes (Signed)
 Pt back from [facility]

## 2024-03-30 NOTE — Discharge Instructions (Signed)
 At this time there are no signs of a stroke and no signs of any herniated disc or bone pushing on one of the nerves in your lumbar spine.  Follow-up with your regular doctor.  Return to the ER immediately for new, worsening, or recurrent weakness or numbness, difficulty walking, urinary incontinence, any new strokelike symptoms such as weakness or numbness in your arm, difficulty with speech or vision, or any other new or worsening symptoms that concern you.

## 2024-03-30 NOTE — ED Notes (Signed)
 Pt taken to MRI

## 2024-03-30 NOTE — ED Provider Notes (Signed)
 [facility] Provider Note    Event Date/Time   First MD Initiated Contact with Patient 03/30/24 1130     (approximate)   History   Extremity Weakness   HPI  Zyionna Pesce is a 73 y.o. female with a history of CKD, osteoporosis, and GERD who presents with left leg weakness.  The patient states that once about a week and a half ago and again this morning, after sitting for a while she stood up and noted that her left leg was weak and that her left foot was dragging behind her.  This lasted about 30 to 40 minutes both times and then resolved on its own.  She denies any associated numbness or tingling in the leg.  She has no numbness or weakness in the arm, difficulty speaking, or changes in vision.  She does have a history of lumbar spondylosis.  She denies any acute back pain.  She has no urinary symptoms.  I reviewed the past medical records.  The patient's most recent outpatient counter was yesterday with family medicine for follow-up of her osteoporosis.   Physical Exam   Triage Vital Signs: ED Triage Vitals  Encounter Vitals Group     BP 03/30/24 1121 (!) 192/87     Girls Systolic BP Percentile --      Girls Diastolic BP Percentile --      Boys Systolic BP Percentile --      Boys Diastolic BP Percentile --      Pulse Rate 03/30/24 1121 81     Resp 03/30/24 1121 18     Temp 03/30/24 1121 97.9 F (36.6 C)     Temp src --      SpO2 03/30/24 1123 98 %     Weight 03/30/24 1125 116 lb 13.5 oz (53 kg)     Height 03/30/24 1125 5' (1.524 m)     Head Circumference --      Peak Flow --      Pain Score 03/30/24 1125 0     Pain Loc --      Pain Education --      Exclude from Growth Chart --     Most recent vital signs: Vitals:   03/30/24 1123 03/30/24 1230  BP:  (!) 152/66  Pulse:  69  Resp:  15  Temp:    SpO2: 98% 99%     General: Awake, no distress.  CV:  Good peripheral perfusion.  Resp:  Normal effort.  Abd:  No distention.   Other:  5/5 motor strength and intact sensation to all extremities.  No pronator drift.  No ataxia on finger-to-nose.  No facial droop.  Cranial nerves III through XII grossly intact.   ED Results / Procedures / Treatments   Labs (all labs ordered are listed, but only abnormal results are displayed) Labs Reviewed  BASIC METABOLIC PANEL WITH GFR - Abnormal; Notable for the following components:      Result Value   Potassium 3.1 (*)    Glucose, Bld 102 (*)    All other components within normal limits  CBC WITH DIFFERENTIAL/PLATELET - Abnormal; Notable for the following components:   WBC 11.3 (*)    HCT 46.2 (*)    Neutro Abs 9.0 (*)    All other components within normal limits     EKG  ED ECG REPORT I, Waylon Cassis, the attending physician, personally viewed and interpreted this ECG.  Date: 03/30/2024 EKG Time: 1135 Rate: 67  Rhythm: normal sinus rhythm QRS Axis: Borderline left axis Intervals: normal ST/T Wave abnormalities: normal Narrative Interpretation: no evidence of acute ischemia    RADIOLOGY  CT head: I independently viewed and interpreted the images; there is no ICH.  Radiology report indicates no acute stroke or other acute abnormality.  MR brain: No acute abnormality  MR lumbar spine: No acute abnormality  PROCEDURES:  Critical Care performed: No  Procedures   MEDICATIONS ORDERED IN ED: Medications - No data to display   IMPRESSION / MDM / ASSESSMENT AND PLAN / ED COURSE  I reviewed the triage vital signs and the nursing notes.  73 year old female with PMH as noted above presents with 2 episodes over the last week and a half of left leg weakness with no other focal neurologic symptoms, both times resolving spontaneously.  Currently, she is hypertensive.  Other vital signs are normal.  Thorough neurologic exam is nonfocal.  Differential diagnosis includes, but is not limited to, TIA, lumbar radiculopathy, peripheral neuropathy.   Presentation is more consistent with lumbar spinal etiology given that the episodes only consist of motor weakness with no sensory deficits and are isolated to the left leg.  However, given the patient's age and comorbidities we will obtain a CT head to rule out subacute CVA.  If this is negative we will obtain MR brain as well as lumbar spine.  Patient's presentation is most consistent with acute presentation with potential threat to life or bodily function.  The patient is on the cardiac monitor to evaluate for evidence of arrhythmia and/or significant heart rate changes.  ----------------------------------------- 3:01 PM on 03/30/2024 -----------------------------------------  BMP and CBC show no acute findings.  CT was negative.  I ordered an MRI of the brain and lumbar spine, both of which were also negative.  The patient remains asymptomatic.  At this time there is no evidence of TIA, stroke, or any acute nerve impingement.  The patient is stable for discharge home.  I counseled her on the results of the workup.  I recommend that she follow-up with her PMD and gave her strict return precautions; she expressed understanding.   FINAL CLINICAL IMPRESSION(S) / ED DIAGNOSES   Final diagnoses:  Transient left leg weakness     Rx / DC Orders   ED Discharge Orders     None        Note:  This document was prepared using Dragon voice recognition software and may include unintentional dictation errors.    Jacolyn Pae, MD 03/30/24 [phone]

## 2024-03-30 NOTE — Telephone Encounter (Signed)
 Referral/Record for Reclast infusion faxed to [facility] at [phone].

## 2024-03-30 NOTE — Telephone Encounter (Signed)
 Called patient and informed her that Per Dr. Skeet If you have had two intermittent episodes of focal weakness, he patient to go to the ED because she needs to have immediate imaging.   Patient verbalized understanding and will go the ED. Patient will update us  on how she is doing.

## 2024-03-30 NOTE — ED Notes (Signed)
 Pt up to toilet

## 2024-04-04 ENCOUNTER — Encounter: Payer: Self-pay | Admitting: Family Medicine

## 2024-04-04 DIAGNOSIS — E1122 Type 2 diabetes mellitus with diabetic chronic kidney disease: Secondary | ICD-10-CM

## 2024-04-06 ENCOUNTER — Encounter: Payer: Self-pay | Admitting: Internal Medicine

## 2024-04-10 ENCOUNTER — Other Ambulatory Visit: Payer: Self-pay | Admitting: Neurology

## 2024-04-10 ENCOUNTER — Telehealth: Payer: Self-pay

## 2024-04-10 DIAGNOSIS — G43709 Chronic migraine without aura, not intractable, without status migrainosus: Secondary | ICD-10-CM

## 2024-04-10 DIAGNOSIS — G43009 Migraine without aura, not intractable, without status migrainosus: Secondary | ICD-10-CM

## 2024-04-10 DIAGNOSIS — G43109 Migraine with aura, not intractable, without status migrainosus: Secondary | ICD-10-CM

## 2024-04-10 NOTE — Telephone Encounter (Signed)
 Copied from CRM #[id number]. Topic: General - Other >> Apr 09, 2024  3:43 PM Thersia BROCKS wrote: Reason for CRM: Thea from [facility] called in stated they have plan of treatment but do not have patients recent labs, need cmp, that have calcium  levels in it   Fax number : [phone]   Information has been faxed.

## 2024-04-11 NOTE — Telephone Encounter (Signed)
 Copied from CRM [id number]. Topic: General - Other >> Apr 11, 2024 12:44 PM Aleatha C wrote: Reason for CRM: [facility] needs patients labs for the order they received [phone] ext [phone] fax # [phone]

## 2024-04-11 NOTE — Telephone Encounter (Signed)
 Lab results faxed to number provided

## 2024-04-12 ENCOUNTER — Encounter: Payer: Self-pay | Admitting: Family Medicine

## 2024-04-26 ENCOUNTER — Ambulatory Visit (INDEPENDENT_AMBULATORY_CARE_PROVIDER_SITE_OTHER): Admitting: Neurology

## 2024-04-26 ENCOUNTER — Encounter: Payer: Self-pay | Admitting: Neurology

## 2024-04-26 VITALS — BP 148/75 | HR 75 | Ht 60.0 in | Wt 114.0 lb

## 2024-04-26 DIAGNOSIS — G459 Transient cerebral ischemic attack, unspecified: Secondary | ICD-10-CM | POA: Diagnosis not present

## 2024-04-26 NOTE — Patient Instructions (Addendum)
 CTA head and neck Echocardiogram Follow up in February as scheduled.

## 2024-04-26 NOTE — Progress Notes (Signed)
 NEUROLOGY FOLLOW UP OFFICE NOTE  Debra Leach [id number]  Assessment/Plan:   Two episodes of left leg weakness.  I have to assume transient ischemic attack involving the right ACA territory.  MRI and symptoms not consistent with lumbar radiculopathy.  She does not exhibit any myelopathic signs to suggest spinal cord involvement.  No associated migraine to suspect hemiplegic migraine.   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 Secondary stroke prevention as managed by her PCP: Eliquis  (taking for DVT/PE) LDL goal less than 70.  On atorvastatin  80mg  daily.  Did not tolerate rosuvastatin.  If PCP feels appropriate, consider adding Zetia .   Hgb A1c goal less than 7 Normotensive blood pressure Lifestyle modification: Continue healthy diet (Mediterranean diet) Routine exercise Follow up in February as scheduled.  Total time spent on today's visit was 68 minutes dedicated to this patient today, preparing to see patient, examining the patient, ordering tests and/or medications and counseling the patient, documenting clinical information in the EHR or other health record, independently interpreting results and communicating results to the patient/family, discussing treatment and goals, answering patient's questions and coordinating care.    Subjective:   Discussed the use of AI scribe software for clinical note transcription with the patient, who gave verbal consent to proceed.  History of Present Illness Debra Leach is a 73 year old female whom I see for migraines, presents with episodes of left leg weakness.  She experienced an episode of left leg weakness in mid-October, characterized by an inability  to lift her leg or dorsiflex and plantar flex her ankle. The first episode occurred early in the morning while sitting and lasted about 30 minutes. She describes the leg as 'not working' and mentions dragging her left leg while walking, with occasional jerking movements. No associated pain, numbness, tingling, or other stroke-like symptoms such as facial droop or arm weakness were noted.  Denies associated migraine with these episodes.    A second episode occurred on October 24th, similar in nature to the first, involving the same left leg and lasting approximately 30 minutes. Again, there were no accompanying symptoms of numbness, tingling, or migraine at the time of the episode. She sought medical attention following this episode and underwent a workup at [facility], including a CT and MRI of the brain, which showed no acute intracranial abnormalities. An MRI of the lumbar spine showed no significant spinal canal or neural foraminal stenosis. Blood tests (CBC and BMP) and cardiac monitoring revealed no acute findings or significant arrhythmias.  Since the episode on October 24th, she has not experienced any further recurrences of the leg weakness.   She notes an improvement in her overall condition since stopping zonisamide  at the beginning of November, which she had been taking for migraine prevention. She reports feeling better than she has in a long time since stopping zonisamide , which she had been taking for migraine prevention. She experienced excessive sleepiness and inability to perform daily activities while on the medication.  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  She takes atorvastatin  80mg  daily.  Did not tolerate rosuvastatin.  LDL from March 2025 was 131.  Hgb A1c from September 2025 was 7.1 (down from 8.2 in March).  PAST MEDICAL HISTORY: Past Medical History:  Diagnosis Date   Anemia    Arthritis    osteoarthritis B  knees, left hip , right elbow   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HCC)    Cataract    Chronic kidney disease    Clotting disorder    Complication of anesthesia    difficult waking   Diabetes mellitus without complication (HCC)    Diet and exercise controlled   Diverticulosis    DVT (deep venous thrombosis) (HCC) 02/2019   3calf 2 right lung 1 left lung   DVT (deep venous thrombosis) (HCC) 2020   right leg   Dyspnea    GERD (gastroesophageal reflux disease)    Headache    Hepatic steatosis    Hiatal hernia    History of kidney stones    Hyperlipidemia    MVP (mitral valve prolapse)    history of   PONV (postoperative nausea and vomiting)    Pulmonary emboli (HCC) 03/01/2019   2 in the right; 1 in the left    MEDICATIONS: Current Outpatient Medications on File Prior to Visit  Medication Sig Dispense Refill   Accu-Chek FastClix Lancets MISC Check blood sugar twice daily 102 each 3   aluminum-magnesium  hydroxide 200-200 MG/5ML suspension Take by mouth every 6 (six) hours as needed for indigestion. As needed     apixaban  (ELIQUIS ) 2.5 MG TABS tablet TAKE 1 TABLET BY MOUTH 2 TIMES DAILY. 180 tablet 3   atorvastatin  (LIPITOR ) 80 MG tablet TAKE 1 TABLET BY MOUTH DAILY 90 tablet 3   Blood Glucose Monitoring Suppl (ACCU-CHEK GUIDE ME) w/Device KIT Check blood sugar twice daily 1 kit 0   budesonide  (ENTOCORT EC ) 3 MG 24 hr capsule TAKE 2 CAPSULES BY MOUTH DAILY. 180 capsule 12   colesevelam  (WELCHOL ) 625 MG tablet TAKE 3 TABLETS BY MOUTH 2 TIMES DAILY WITH A MEAL. 540 tablet 3   Continuous Glucose Sensor (DEXCOM G7 SENSOR) MISC 1 Device by Does not apply route as directed. 9 each 3   Eptinezumab-jjmr (VYEPTI) 100 MG/ML injection Inject 300 mg into the vein every 3 (three) months.     famotidine  (PEPCID ) 10 MG tablet Take 10 mg by mouth 2 (two) times daily.     ferrous gluconate  (FERGON) 324 MG tablet Take 1 tablet (324 mg total) by mouth daily with breakfast. 90 tablet 3   gabapentin   (NEURONTIN ) 100 MG capsule TAKE 1 CAPSULE BY MOUTH UP TO 3 TIMES DAILY AS NEEDED. (Patient taking differently: Take 100 mg by mouth 3 (three) times daily. TAKE 1 CAPSULE BY MOUTH UP TO 3 TIMES DAILY AS NEEDED.) 270 capsule 3   gabapentin  (NEURONTIN ) 300 MG capsule TAKE 2 CAPSULES (600 MG TOTAL) BY MOUTH AT BEDTIME. 180 capsule 0   glucose blood (ACCU-CHEK GUIDE TEST) test strip Check blood sugar twice daily 100 each 12   insulin  glargine (LANTUS  SOLOSTAR) 100 UNIT/ML Solostar Pen Inject 10 Units into the skin daily. (Patient taking differently: Inject 6 Units into the skin daily.)     Insulin  Pen Needle 32G X 4 MM MISC 1 Device by Does not apply route in the morning, at noon, in the evening, and at bedtime. 400 each 3   meclizine  (ANTIVERT ) 25 MG tablet TAKE 1 TABLET BY MOUTH 3 TIMES DAILY AS NEEDED FOR DIZZINESS. 30 tablet 1   ondansetron  (ZOFRAN ) 8 MG tablet Take 1 tablet (8 mg total) by mouth every 8 (eight) hours as needed for nausea or vomiting. 30 tablet 1  pantoprazole  (PROTONIX ) 40 MG tablet TAKE 1 TABLET (40 MG TOTAL) BY MOUTH DAILY. 30 tablet 3   potassium chloride  SA (KLOR-CON  M) 20 MEQ tablet TAKE 1 TABLET BY MOUTH DAILY. 90 tablet 3   repaglinide  (PRANDIN ) 2 MG tablet Take 2 mg with breakfast, 2 mg with lunch, and 4 mg with supper 360 tablet 3   triamcinolone (NASACORT ALLERGY 24HR) 55 MCG/ACT AERO nasal inhaler Place 2 sprays into the nose daily.     UBRELVY  100 MG TABS TAKE 1 TABLET BY MOUTH AS NEEDED (MAY REPEAT DOSE AFTER 2 HOURS IF NEEDED. MAXIMUM 2 TABLETS IN 24 HOURS). 10 tablet 3   vitamin B-12 (CYANOCOBALAMIN ) 1000 MCG tablet Take 1,000 mcg by mouth daily with breakfast.     Vitamin D , Ergocalciferol , (DRISDOL ) 1.25 MG (50000 UNIT) CAPS capsule TAKE 1 CAPSULE BY MOUTH ONCE A WEEK. 12 capsule 3   Current Facility-Administered Medications on File Prior to Visit  Medication Dose Route Frequency Provider Last Rate Last Admin   fidaxomicin  (DIFICID ) tablet 200 mg  200 mg Oral BID           ALLERGIES: Allergies  Allergen Reactions   Clindamycin/Lincomycin Anaphylaxis   Atenolol  Other (See Comments)    Migraine   Codeine Itching and Swelling    REACTION: Migraine   Epinephrine  Itching   Hydrocodone Other (See Comments)    Migraine   Ibuprofen Other (See Comments)    Migraine   Jardiance  [Empagliflozin ] Other (See Comments)    Caused uti Yeast infection   Metronidazole  Swelling    Mouth and lips   Oxycodone Other (See Comments)    Migraine   Penicillins Itching and Swelling    ** Tolerates cephalosporins Has patient had a PCN reaction causing immediate rash, facial/tongue/throat swelling, SOB or lightheadedness with hypotension: No Has patient had a PCN reaction causing severe rash involving mucus membranes or skin necrosis: No Has patient had a PCN reaction that required hospitalization: No Has patient had a PCN reaction occurring within the last 10 years: No     Promethazine  Other (See Comments)    migraine   Propranolol  Other (See Comments)    Migraine   Sudafed [Pseudoephedrine Hcl] Hives, Itching and Anxiety   Talwin [Pentazocine] Other (See Comments)    Swelling and itching    Tramadol Other (See Comments)    Migraine   Vancomycin  Swelling    Swelling of Lips and Mouth   Crestor [Rosuvastatin] Other (See Comments)    Muscle pain    FAMILY HISTORY: Family History  Problem Relation Age of Onset   Bone cancer Mother    Hypertension Father    Mitral valve prolapse Father    Colon polyps Brother    Asthma Brother    Arthritis Brother    Nephrolithiasis Brother    Asthma Brother    COPD Brother    Breast cancer Maternal Aunt    Breast cancer Maternal Aunt    Colon cancer Neg Hx    Esophageal cancer Neg Hx    Inflammatory bowel disease Neg Hx    Liver disease Neg Hx    Pancreatic cancer Neg Hx    Rectal cancer Neg Hx    Stomach cancer Neg Hx       Objective:  Blood pressure (!) 148/75, pulse 75, height 5' (1.524 m), weight 114  lb (51.7 kg), SpO2 98%. General: No acute distress.  Patient appears well-groomed.   Head:  Normocephalic/atraumatic Eyes:  Fundi examined but not visualized Neck:  supple, no paraspinal tenderness, full range of motion Heart:  Regular rate and rhythm Neurological Exam: alert and oriented.  Speech fluent and not dysarthric, language intact.  CN II-XII intact. Bulk and tone normal, muscle strength 5/5 throughout.  Trace reduced pinprick sensation over dorsum of left foot.  Vibratory sensation intact.  Deep tendon reflexes 2+ throughout, toes downgoing.  Finger to nose testing intact.  Gait normal, Romberg negative.   Juliene Dunnings, DO  CC: Greig Ring, MD

## 2024-04-27 ENCOUNTER — Ambulatory Visit (HOSPITAL_BASED_OUTPATIENT_CLINIC_OR_DEPARTMENT_OTHER)
Admission: RE | Admit: 2024-04-27 | Discharge: 2024-04-27 | Disposition: A | Discharge status: Home / Self Care | Payer: Self-pay | Source: Ambulatory Visit | Attending: Family Medicine | Admitting: Family Medicine

## 2024-04-27 ENCOUNTER — Other Ambulatory Visit: Payer: Self-pay | Admitting: Family Medicine

## 2024-04-27 ENCOUNTER — Ambulatory Visit: Payer: Self-pay | Admitting: Family Medicine

## 2024-04-27 ENCOUNTER — Ambulatory Visit: Admitting: Neurology

## 2024-04-27 DIAGNOSIS — E1122 Type 2 diabetes mellitus with diabetic chronic kidney disease: Secondary | ICD-10-CM | POA: Insufficient documentation

## 2024-04-27 DIAGNOSIS — R931 Abnormal findings on diagnostic imaging of heart and coronary circulation: Secondary | ICD-10-CM

## 2024-04-27 DIAGNOSIS — N1831 Chronic kidney disease, stage 3a: Secondary | ICD-10-CM | POA: Insufficient documentation

## 2024-04-27 DIAGNOSIS — Z794 Long term (current) use of insulin: Secondary | ICD-10-CM | POA: Insufficient documentation

## 2024-04-27 NOTE — Addendum Note (Signed)
 Addended by: AVELINA NO E on: 04/27/2024 04:42 PM   Modules accepted: Orders

## 2024-04-30 ENCOUNTER — Ambulatory Visit
Admission: RE | Admit: 2024-04-30 | Discharge: 2024-04-30 | Disposition: A | Source: Ambulatory Visit | Attending: Neurology

## 2024-04-30 ENCOUNTER — Telehealth: Payer: Self-pay

## 2024-04-30 ENCOUNTER — Ambulatory Visit
Admission: RE | Admit: 2024-04-30 | Discharge: 2024-04-30 | Disposition: A | Discharge status: Home / Self Care | Source: Ambulatory Visit | Attending: Neurology | Admitting: Neurology

## 2024-04-30 DIAGNOSIS — G459 Transient cerebral ischemic attack, unspecified: Secondary | ICD-10-CM

## 2024-04-30 MED ORDER — IOPAMIDOL (ISOVUE-370) INJECTION 76%
75.0000 mL | Freq: Once | INTRAVENOUS | Status: AC | PRN
  Administered 2024-04-30: 75 mL via INTRAVENOUS

## 2024-04-30 NOTE — Telephone Encounter (Signed)
 Echo,complete NO PA NEEDED.  Rjdz#[id number]   Code 7376: Approved 04/30/24-06/14/24 Auth# [id number]   [id number] NO PA NEEDED

## 2024-05-02 ENCOUNTER — Ambulatory Visit (HOSPITAL_COMMUNITY)
Admission: RE | Admit: 2024-05-02 | Discharge: 2024-05-02 | Disposition: A | Discharge status: Home / Self Care | Source: Ambulatory Visit | Attending: Cardiology | Admitting: Cardiology

## 2024-05-02 ENCOUNTER — Encounter: Payer: Self-pay | Admitting: Neurology

## 2024-05-02 DIAGNOSIS — G459 Transient cerebral ischemic attack, unspecified: Secondary | ICD-10-CM | POA: Diagnosis present

## 2024-05-02 LAB — ECHOCARDIOGRAM COMPLETE
Area-P 1/2: 3.75 cm2
P 1/2 time: 618 ms
S' Lateral: 1.8 cm

## 2024-05-07 ENCOUNTER — Ambulatory Visit: Payer: Self-pay | Admitting: Neurology

## 2024-05-07 NOTE — Progress Notes (Unsigned)
 NEUROLOGY FOLLOW UP OFFICE NOTE  Debra Leach [id number]  Assessment/Plan:   Two episodes of left leg weakness.  I have to assume transient ischemic attack involving the right ACA territory.  MRI and symptoms not consistent with lumbar radiculopathy.  She does not exhibit any myelopathic signs to suggest spinal cord involvement.  No associated migraine to suspect hemiplegic migraine.   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 Secondary stroke prevention as managed by her PCP: Eliquis  (taking for DVT/PE) LDL goal less than 70.  On atorvastatin  80mg  daily.  Did not tolerate rosuvastatin.  If PCP feels appropriate, consider adding Zetia .   Hgb A1c goal less than 7 Normotensive blood pressure Lifestyle modification: Continue healthy diet (Mediterranean diet) Routine exercise Follow up in February as scheduled.  Total time spent on today's visit was 68 minutes dedicated to this patient today, preparing to see patient, examining the patient, ordering tests and/or medications and counseling the patient, documenting clinical information in the EHR or other health record, independently interpreting results and communicating results to the patient/family, discussing treatment and goals, answering patient's questions and coordinating care.    Subjective:   History of Present Illness Debra Leach is a 73 year old female whom I see for migraines, presents with episodes of left leg weakness.  UPDATE: ***  She experienced an episode of left leg weakness in mid-October 2025, characterized by an inability to lift her leg or dorsiflex and plantar flex her ankle. The first episode occurred early in the morning  while sitting and lasted about 30 minutes. She describes the leg as 'not working' and mentions dragging her left leg while walking, with occasional jerking movements. No associated pain, numbness, tingling, or other stroke-like symptoms such as facial droop or arm weakness were noted.  Denies associated migraine with these episodes.  A second episode occurred on October 24th, 2025, similar in nature to the first, involving the same left leg and lasting approximately 30 minutes. Again, there were no accompanying symptoms of numbness, tingling, or migraine at the time of the episode. She sought medical attention following this episode and underwent a workup at [facility], including a CT and MRI of the brain, which showed no acute intracranial abnormalities. An MRI of the lumbar spine showed no significant spinal canal or neural foraminal stenosis. Blood tests (CBC and BMP) and cardiac monitoring revealed no acute findings or significant arrhythmias.  She underwent TIA workup.  CTA head and neck on 04/30/2024 was unremarkable.  Echocardiogram on 05/02/2024 showed normal LVEF without significant valvular disease or other cardiac source of emboli.  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  She takes atorvastatin  80mg  daily.  Did not tolerate rosuvastatin.  LDL from March 2025 was 131.  Hgb A1c from September 2025 was 7.1 (down from 8.2 in March).  Current medications: Rescue protocol:  For moderate - gabapentin  100mg , for severe- Ubrelvy  with or without gabapentin . Current NSAIDS/steroid: budesomide Current analgesics: None Current triptans: None Current ergotamine: None Current anti-emetic: Promethazine  12.5 mg Current muscle relaxants: None Current anti-anxiolytic: None Current sleep aide: None Current Antihypertensive medications: losartan -HCTZ Current Antidepressant medications: None Current Anticonvulsant medications: Gabapentin  600mg   at bedtime and 100mg  TID PRN  Current anti-CGRP: Vyepti 300mg ; Ubrelvy  Current Vitamins/Herbal/Supplements: Riboflavin 400 mg daily, vitamin D  Current Antihistamines/Decongestants: meclizine  Other therapy: Cefaly, ice pack Other medication: Meclizine   HISTORY: Migraines: Onset: 73 years old Location:  Varies (unilateral either side, frontal-temporal, back of head) Quality:  Varies (stabbing, pounding, throbbing) Initial Intensity:  10/10 severe, otherwise 5-7/10 Aura:  Occurs off and on over the years and varies in semiology.  In early 1970s, black out.  In late 1980s, white out.  In late 1990s, flashing lights and zigzag lines.  Since 2016, scintillating scotoma (occurs 1 to 2 times a month) Prodrome:  no Associated symptoms: Initially vomiting.  Sometimes nausea.  Photophobia, phonophobia.  Dizziness, speech difficulty. Initial Duration:  All day Initial Frequency:  daily Triggers: Change in weather, emotional stress, valsalva maneuver, odors, certain foods Relieving factors: ice, rest Activity:  Difficult to function if severe   She has been to the ED for migraines with speech disturbance.  Workup in March 2024 included MRI of brain without contrast and CTA head and neck which were unremarkable.     Past NSAIDS:  diclofenac  50mg , Cambia, Mobic  15mg , ibuprofen, naproxen, toradol  Past analgesics:  tramadol (reaction), Midrin, Excedrin Past abortive triptans/ergots:  Treximet, Axert, Amerge, Frova, Maxalt, Relpax, Zomig tablet, DHE NS, sumatriptan  100mg ; sumatriptan  6mg  [place] Past anti-emetic:  Zofran  4mg  Past anxiolytic:  Buspirone, clonazepam Past antihypertensive medications:  Metoprolol , Norvasc, propranolol  ER 120mg , atenolol  100mg  (side effects dizziness, myalgias, acid reflux) Past antidepressant medications:  Venlafaxine, sertraline, amitriptyline, amoxapine, duloxetine, nortriptyline, imipramine, Luvox, Remeron, Serzone, bupropion, desipramine, doxepin, Vivactil Past  anticonvulsant medications:  Depakote, Topamax, zonisamide , Lamictal, Keppra, Lyrica, gabapentin  300/300/600  Past CGRP inhibitor: Aimovig , Emgality , Nurtec PRN (quick onset but doesn't last) Past vitamins/Herbal/Supplements:  butterbur, Mg, CoQ10 Other past treatments:  Botox , Cefaly Device, methylergonovine, acupuncture, biofeedback, Seroquel, Thorazine, methylsergide maleate   Family history of headache:  Maybe her mother's side.   Prior brain MRI normal (date unknown but report is mentioned in prior notes).   Vestibular migraine: Began having dizziness in early 2022.  No preceding head injury or new medication.  Reports episodic dizziness described as a wave of lightheadedness or sensation of stepping off of a boat, but not spinning.  It occurs with any movement but also can occur spontaneously.  Typically lasts 15 seconds but occurs all day long.  It doesn't occur while laying in bed, such as turning over.  No headache but feels like her head is floating.  No visual disturbance.  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  It does not appear to correlate with a migraine flare.  She reports history of mild orthostatic hypotension when getting up too fast, but not experiencing that with this and reportedly orthostatic vitals were negative when checked by her PCP.  Flonase didn't help, however meclizine  is effective.  She followed up with ENT.  Dix-Hallpike was negative.  VNG showed abnormal smooth pursuit, saccades and optokinetic testing - central pathology cannot be ruled out.  She has bilateral sensorineural hearing loss.  Hearing aids were recommended.  Underwent workup for possible central dizziness.  MRI and MRA of brain on 12/23/2020 were unremarkable.  Vestibular rehab effective.   Ocular migraine: In April 2023, she began having episodes of monocular dimming of vision in the right eye lasting 15  minutes.  No associated headache.  Different than her previous ocular migraines presenting as pixel vision.  Saw her ophthalmologist and eye  exam was normal.  Sed rate on 5/12 was 102 and repeat on 5/17 was 130.  She was hospitalized on 11/02/2021 for syncope in setting of AKI and flu.  She was placed on 5 day course of prednsione.  Sed rate on day of admission was 60.  Sed rate in May 2023 was over 130.  Carotid ultrasound on 6/15 was negative for hemodynamically significant stenosis.  Underwent temporal artery biopsy on 6/29 which was negative.  Repeat eye exam on 12/09/2021 was normal.    Abnormal Movements: In December 2017, we started atenolol  for migraine prevention.  It worked well, but due to reported side effects of dizziness, acid reflux and joint and muscle pain, she was tapered off of it in early April.  Around that time, she developed shaking episodes or tremors associated with her migraines.  She followed up with her PCP, Dr. Avelina, on 10/08/16 and we had her discontinue the gabapentin .  She hasn't had any recurrent tremors, however she says she has gone up to 4 days at a time without an attack.  Since discontinuing the gabapentin , the intensity of her daily headaches have gotten worse.     I reviewed the video of her habitual attacks.  In the video, she exhibits flapping of her right hand, arrhythmic and without tremor or choreiform movement.  It is not an arrhythmic jerking, consistent with myoclonus.  It sometimes involves the left hand as well.  She denies stress and anxiety.  PAST MEDICAL HISTORY: 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HCC)    Cataract    Chronic kidney disease    Clotting disorder    Complication of anesthesia    difficult waking   Diabetes mellitus without complication (HCC)    Diet and exercise controlled   Diverticulosis    DVT (deep venous thrombosis) (HCC)  02/2019   3calf 2 right lung 1 left lung   DVT (deep venous thrombosis) (HCC) 2020   right leg   Dyspnea    GERD (gastroesophageal reflux disease)    Headache    Hepatic steatosis    Hiatal hernia    History of kidney stones    Hyperlipidemia    MVP (mitral valve prolapse)    history of   PONV (postoperative nausea and vomiting)    Pulmonary emboli (HCC) 03/01/2019   2 in the right; 1 in the left    MEDICATIONS: Current Outpatient Medications on File Prior to Visit  Medication Sig Dispense Refill   Accu-Chek FastClix Lancets MISC Check blood sugar twice daily 102 each 3   aluminum-magnesium  hydroxide 200-200 MG/5ML suspension Take by mouth every 6 (six) hours as needed for indigestion. As needed     apixaban  (ELIQUIS ) 2.5 MG TABS tablet TAKE 1 TABLET BY MOUTH 2 TIMES DAILY. 180 tablet 3   atorvastatin  (LIPITOR ) 80 MG tablet TAKE 1 TABLET BY MOUTH DAILY 90 tablet 3   Blood Glucose Monitoring Suppl (ACCU-CHEK GUIDE ME) w/Device KIT Check blood sugar twice daily 1 kit 0   budesonide  (ENTOCORT EC ) 3 MG 24 hr capsule TAKE 2 CAPSULES BY MOUTH DAILY. 180 capsule 12   colesevelam  (WELCHOL ) 625 MG tablet TAKE 3 TABLETS BY MOUTH 2 TIMES DAILY WITH A MEAL. 540 tablet 3   Continuous Glucose Sensor (DEXCOM G7 SENSOR) MISC 1 Device by Does not apply route as directed. 9 each 3  Eptinezumab-jjmr (VYEPTI) 100 MG/ML injection Inject 300 mg into the vein every 3 (three) months.     famotidine  (PEPCID ) 10 MG tablet Take 10 mg by mouth 2 (two) times daily.     ferrous gluconate  (FERGON) 324 MG tablet Take 1 tablet (324 mg total) by mouth daily with breakfast. 90 tablet 3   gabapentin  (NEURONTIN ) 100 MG capsule TAKE 1 CAPSULE BY MOUTH UP TO 3 TIMES DAILY AS NEEDED. (Patient taking differently: Take 100 mg by mouth 3 (three) times daily. TAKE 1 CAPSULE BY MOUTH UP TO 3 TIMES DAILY AS NEEDED.) 270 capsule 3   gabapentin  (NEURONTIN ) 300 MG capsule TAKE 2 CAPSULES (600 MG TOTAL) BY MOUTH AT BEDTIME. 180  capsule 0   glucose blood (ACCU-CHEK GUIDE TEST) test strip Check blood sugar twice daily 100 each 12   insulin  glargine (LANTUS  SOLOSTAR) 100 UNIT/ML Solostar Pen Inject 10 Units into the skin daily. (Patient taking differently: Inject 6 Units into the skin daily.)     Insulin  Pen Needle 32G X 4 MM MISC 1 Device by Does not apply route in the morning, at noon, in the evening, and at bedtime. 400 each 3   meclizine  (ANTIVERT ) 25 MG tablet TAKE 1 TABLET BY MOUTH 3 TIMES DAILY AS NEEDED FOR DIZZINESS. 30 tablet 1   ondansetron  (ZOFRAN ) 8 MG tablet Take 1 tablet (8 mg total) by mouth every 8 (eight) hours as needed for nausea or vomiting. 30 tablet 1   pantoprazole  (PROTONIX ) 40 MG tablet TAKE 1 TABLET (40 MG TOTAL) BY MOUTH DAILY. 90 tablet 3   potassium chloride  SA (KLOR-CON  M) 20 MEQ tablet TAKE 1 TABLET BY MOUTH DAILY. 90 tablet 3   repaglinide  (PRANDIN ) 2 MG tablet Take 2 mg with breakfast, 2 mg with lunch, and 4 mg with supper 360 tablet 3   triamcinolone (NASACORT ALLERGY 24HR) 55 MCG/ACT AERO nasal inhaler Place 2 sprays into the nose daily.     UBRELVY  100 MG TABS TAKE 1 TABLET BY MOUTH AS NEEDED (MAY REPEAT DOSE AFTER 2 HOURS IF NEEDED. MAXIMUM 2 TABLETS IN 24 HOURS). 10 tablet 3   vitamin B-12 (CYANOCOBALAMIN ) 1000 MCG tablet Take 1,000 mcg by mouth daily with breakfast.     Vitamin D , Ergocalciferol , (DRISDOL ) 1.25 MG (50000 UNIT) CAPS capsule TAKE 1 CAPSULE BY MOUTH ONCE A WEEK. 12 capsule 3   Current Facility-Administered Medications on File Prior to Visit  Medication Dose Route Frequency Provider Last Rate Last Admin   fidaxomicin  (DIFICID ) tablet 200 mg  200 mg Oral BID          ALLERGIES: Allergies  Allergen Reactions   Clindamycin/Lincomycin Anaphylaxis   Atenolol  Other (See Comments)    Migraine   Codeine Itching and Swelling    REACTION: Migraine   Epinephrine  Itching   Hydrocodone Other (See Comments)    Migraine   Ibuprofen Other (See Comments)    Migraine    Jardiance  [Empagliflozin ] Other (See Comments)    Caused uti Yeast infection   Metronidazole  Swelling    Mouth and lips   Oxycodone Other (See Comments)    Migraine   Penicillins Itching and Swelling    ** Tolerates cephalosporins Has patient had a PCN reaction causing immediate rash, facial/tongue/throat swelling, SOB or lightheadedness with hypotension: No Has patient had a PCN reaction causing severe rash involving mucus membranes or skin necrosis: No Has patient had a PCN reaction that required hospitalization: No Has patient had a PCN reaction occurring within the last 10  years: No     Promethazine  Other (See Comments)    migraine   Propranolol  Other (See Comments)    Migraine   Sudafed [Pseudoephedrine Hcl] Hives, Itching and Anxiety   Talwin [Pentazocine] Other (See Comments)    Swelling and itching    Tramadol Other (See Comments)    Migraine   Vancomycin  Swelling    Swelling of Lips and Mouth   Crestor [Rosuvastatin] Other (See Comments)    Muscle pain    FAMILY HISTORY: Family History  Problem Relation Age of Onset   Bone cancer Mother    Hypertension Father    Mitral valve prolapse Father    Colon polyps Brother    Asthma Brother    Arthritis Brother    Nephrolithiasis Brother    Asthma Brother    COPD Brother    Breast cancer Maternal Aunt    Breast cancer Maternal Aunt    Colon cancer Neg Hx    Esophageal cancer Neg Hx    Inflammatory bowel disease Neg Hx    Liver disease Neg Hx    Pancreatic cancer Neg Hx    Rectal cancer Neg Hx    Stomach cancer Neg Hx       Objective:  *** General: No acute distress.  Patient appears well-groomed.   Head:  Normocephalic/atraumatic Eyes:  Fundi examined but not visualized Neck: supple, no paraspinal tenderness, full range of motion Heart:  Regular rate and rhythm Neurological Exam: alert and oriented.  Speech fluent and not dysarthric, language intact.  CN II-XII intact. Bulk and tone normal, muscle strength  5/5 throughout.  Trace reduced pinprick sensation over dorsum of left foot.  Vibratory sensation intact.  Deep tendon reflexes 2+ throughout, toes downgoing.  Finger to nose testing intact.  Gait normal, Romberg negative. ***   Juliene Dunnings, DO  CC: Greig Ring, MD

## 2024-05-08 ENCOUNTER — Ambulatory Visit: Admitting: Neurology

## 2024-05-08 ENCOUNTER — Other Ambulatory Visit: Payer: Self-pay | Admitting: Internal Medicine

## 2024-05-08 ENCOUNTER — Encounter: Payer: Self-pay | Admitting: Neurology

## 2024-05-08 VITALS — BP 150/70 | HR 70 | Ht 60.0 in | Wt 115.2 lb

## 2024-05-08 DIAGNOSIS — R29898 Other symptoms and signs involving the musculoskeletal system: Secondary | ICD-10-CM

## 2024-05-08 NOTE — Progress Notes (Signed)
 Noted and discussed in office.

## 2024-05-08 NOTE — Patient Instructions (Signed)
 MRI cervical and thoracic spine without contrast Further recommendations pending results.

## 2024-05-11 ENCOUNTER — Other Ambulatory Visit

## 2024-05-11 ENCOUNTER — Ambulatory Visit

## 2024-05-11 ENCOUNTER — Ambulatory Visit (HOSPITAL_COMMUNITY): Admission: RE | Admit: 2024-05-11 | Discharge: 2024-05-11 | Attending: Neurology

## 2024-05-11 DIAGNOSIS — R29898 Other symptoms and signs involving the musculoskeletal system: Secondary | ICD-10-CM

## 2024-05-14 NOTE — Progress Notes (Signed)
 Cardiology Office Note:  .   Date:  05/24/2024  ID:  [place] Debra Leach, DOB 1951/03/12, MRN [id number] PCP: Avelina Greig BRAVO, MD  [facility] Providers Cardiologist:  Darryle ONEIDA Decent, MD   History of Present Illness: .    Chief Complaint  Patient presents with   Coronary Artery Disease    Debra Leach is a 73 y.o. female with below history who presents for the evaluation of CAD at the request of Avelina, Greig BRAVO, MD.   History of Present Illness   Debra Leach is a 73 year old female who presents for coronary evaluation. She was referred by her primary doctor for evaluation following a coronary calcium  score test.  She underwent coronary calcium  scoring in November 2025, which revealed a calcium  score of 1688, placing her in the 98th percentile. No history of myocardial infarction or cerebrovascular accident, although she has had abnormal EKGs in the past.  She has a history of type 2 diabetes, diagnosed in 2012, initially managed with diet and exercise. In 2019, she began insulin  therapy due to autoimmune pancreatitis, which requires lifelong steroid treatment. Her insulin  use has decreased from 60 units to 6 units daily.  She reports a history of autoimmune pancreatitis, which is challenging to control and requires ongoing steroid treatment.  She has experienced episodes of leg weakness and heaviness since October 2025, initially affecting one leg and later both. Multiple diagnostic tests, including CT scans, MRIs, EKG, and echocardiogram, have not identified a cause. A neurologist has ruled out TIAs and plans further nerve and muscle testing.  She has a history of high cholesterol and is currently on Lipitor  80 mg. Her cholesterol levels were checked in March 2025 and were not optimal.  She experienced six blood clots in September 2020 and is currently on Eliquis  for anticoagulation.  She reports a family history of heart issues, including her father  with mitral valve prolapse, an older brother who had a stroke, and a younger brother with an aortic aneurysm.  She quit smoking over 30 years ago and is a retired hydrographic surveyor. She used to be active but has reduced activity due to leg issues. She recently resumed using a stationary bike, starting with 15 minutes a day.  No chest pain or trouble breathing. Reports leg heaviness and weakness.           Problem List  CAD -CAC 1688 (98th percentile)  DM -A1c 7.1 HLD -T chol 221, HDL 57, LDL 131, TG 164 HTN DVT Autoimmune pancreatitis     ROS: All other ROS reviewed and negative. Pertinent positives noted in the HPI.     Studies Reviewed: SABRA       EKG 04/02/2024: NSR 67 bpm, no acute changes   TTE 05/02/2024  1. Left ventricular ejection fraction, by estimation, is 60 to 65%. The  left ventricle has normal function. The left ventricle has no regional  wall motion abnormalities. Left ventricular diastolic parameters are  consistent with Grade I diastolic  dysfunction (impaired relaxation). The average left ventricular global  longitudinal strain is -23.3 %. The global longitudinal strain is normal.   2. Right ventricular systolic function is normal. The right ventricular  size is normal. There is normal pulmonary artery systolic pressure. The  estimated right ventricular systolic pressure is 25.7 mmHg.   3. The mitral valve is normal in structure. No evidence of mitral valve  regurgitation. No evidence of mitral stenosis.   4. The aortic  valve is tricuspid. There is mild calcification of the  aortic valve. Aortic valve regurgitation is mild. Aortic valve  sclerosis/calcification is present, without any evidence of aortic  stenosis.   Physical Exam:   VS:  BP (!) 158/76 (BP Location: Left Arm, Patient Position: Sitting, Cuff Size: Normal)   Pulse 91   Resp 16   Ht 5' (1.524 m)   Wt 117 lb 6.4 oz (53.3 kg)   SpO2 97%   BMI 22.93 kg/m    Wt Readings from Last 3  Encounters:  05/24/24 117 lb 6.4 oz (53.3 kg)  05/08/24 115 lb 3.2 oz (52.3 kg)  04/26/24 114 lb (51.7 kg)    GEN: Well nourished, well developed in no acute distress NECK: No JVD; No carotid bruits CARDIAC: RRR, no murmurs, rubs, gallops RESPIRATORY:  Clear to auscultation without rales, wheezing or rhonchi  ABDOMEN: Soft, non-tender, non-distended EXTREMITIES:  No edema; No deformity  ASSESSMENT AND PLAN: .   Assessment and Plan    Coronary artery disease Coronary calcium  score of 1688 indicates significant calcification. High cholesterol and diabetes increase risk for disease progression. - Ordered stress test to assess for significant CAD.  - Continue Eliquis  for anticoagulation.  Mixed hyperlipidemia Cholesterol levels remain elevated despite Lipitor  80 mg. Repatha recommended for additional management. - Referred to pharmacist for Repatha initiation and insurance coordination. - Continue Lipitor  80 mg.  Hypertension Blood pressure elevated, averaging 160-165/77-78 mmHg. Steroid use may exacerbate hypertension. Patient hesitant to start medication due to past migraine exacerbation. - Provided blood pressure log for home monitoring. - Scheduled follow-up in 3-4 months to review blood pressure log and discuss potential need for antihypertensive medication.            Informed Consent   Shared Decision Making/Informed Consent The risks [chest pain, shortness of breath, cardiac arrhythmias, dizziness, blood pressure fluctuations, myocardial infarction, stroke/transient ischemic attack, nausea, vomiting, allergic reaction, radiation exposure, metallic taste sensation and life-threatening complications (estimated to be 1 in 10,000)], benefits (risk stratification, diagnosing coronary artery disease, treatment guidance) and alternatives of a nuclear stress test were discussed in detail with Ms. Rajagopalan and she agrees to proceed.      Follow-up: Return in about 3 months (around  08/22/2024).  Signed, Darryle DASEN. Barbaraann, MD, [facility]  [facility]  [address] [place], [place] [address] [phone]  9:29 AM

## 2024-05-15 ENCOUNTER — Ambulatory Visit: Payer: Self-pay | Admitting: Neurology

## 2024-05-15 DIAGNOSIS — R29898 Other symptoms and signs involving the musculoskeletal system: Secondary | ICD-10-CM

## 2024-05-15 NOTE — Telephone Encounter (Signed)
 Patient advised. EMG Ordered.

## 2024-05-15 NOTE — Telephone Encounter (Signed)
-----   Message from Debra Leach sent at 05/15/2024 12:39 PM EST ----- MRI of cervical and thoracic spine are unremarkable.  Would like to check nerve conduction study of bilateral lower extremities for bilateral leg weakness.   ----- Message ----- From: Interface, Rad Results In Sent: 05/15/2024  11:07 AM EST To: Debra JONELLE Dunnings, DO

## 2024-05-22 ENCOUNTER — Encounter: Payer: Self-pay | Admitting: Neurology

## 2024-05-24 ENCOUNTER — Ambulatory Visit: Admitting: Cardiovascular Disease

## 2024-05-24 ENCOUNTER — Encounter: Payer: Self-pay | Admitting: Cardiovascular Disease

## 2024-05-24 VITALS — BP 158/76 | HR 91 | Resp 16 | Ht 60.0 in | Wt 117.4 lb

## 2024-05-24 DIAGNOSIS — I150 Renovascular hypertension: Secondary | ICD-10-CM | POA: Diagnosis not present

## 2024-05-24 DIAGNOSIS — I2510 Atherosclerotic heart disease of native coronary artery without angina pectoris: Secondary | ICD-10-CM | POA: Diagnosis not present

## 2024-05-24 DIAGNOSIS — E782 Mixed hyperlipidemia: Secondary | ICD-10-CM | POA: Diagnosis not present

## 2024-05-24 DIAGNOSIS — R931 Abnormal findings on diagnostic imaging of heart and coronary circulation: Secondary | ICD-10-CM

## 2024-05-24 NOTE — Patient Instructions (Signed)
 Medication Instructions:  Your physician recommends that you continue on your current medications as directed. Please refer to the Current Medication list given to you today.  *If you need a refill on your cardiac medications before your next appointment, please call your pharmacy*   Testing/Procedures: Your physician has requested that you have a lexiscan  myoview .  The test will take approximately 3 to 4 hours to complete; you may bring reading material.  If someone comes with you to your appointment, they will need to remain in the main lobby due to limited space in the testing area. **If you are pregnant or breastfeeding, please notify the nuclear lab prior to your appointment**  How to prepare for your Myocardial Perfusion Test: Do not eat or drink 3 hours prior to your test, except you may have water. Do not consume products containing caffeine (regular or decaffeinated) 12 hours prior to your test. (ex: coffee, chocolate, sodas, tea). Do bring a list of your current medications with you.  If not listed below, you may take your medications as normal. Do wear comfortable clothes (no dresses or overalls) and walking shoes, tennis shoes preferred (No heels or open toe shoes are allowed). Do NOT wear cologne, perfume, aftershave, or lotions (deodorant is allowed). If these instructions are not followed, your test will have to be rescheduled.   Follow-Up: At [facility], you and your health needs are our priority.  As part of our continuing mission to provide you with exceptional heart care, our providers are all part of one team.  This team includes your primary Cardiologist (physician) and Advanced Practice Providers or APPs (Physician Assistants and Nurse Practitioners) who all work together to provide you with the care you need, when you need it.  Your next appointment:   4 month(s)  Provider:   Darryle ONEIDA Decent, MD    Other Instructions Please take your blood pressure daily  for 2 weeks and send in a MyChart message. Please include heart rates. (One message at the end of the 2 weeks).   HOW TO TAKE YOUR BLOOD PRESSURE: Rest 5 minutes before taking your blood pressure. Dont smoke or drink caffeinated beverages for at least 30 minutes before. Take your blood pressure before (not after) you eat. Sit comfortably with your back supported and both feet on the floor (dont cross your legs). Elevate your arm to heart level on a table or a desk. Use the proper sized cuff. It should fit smoothly and snugly around your bare upper arm. There should be enough room to slip a fingertip under the cuff. The bottom edge of the cuff should be 1 inch above the crease of the elbow. Ideally, take 3 measurements at one sitting and record the average.    Blood Pressure Log Date/TIme Medications taken? (Y/N) Blood Pressure Heart Rate/Pulse

## 2024-05-28 ENCOUNTER — Other Ambulatory Visit: Payer: Self-pay | Admitting: Gastroenterology

## 2024-05-29 ENCOUNTER — Telehealth (HOSPITAL_COMMUNITY): Payer: Self-pay

## 2024-05-29 NOTE — Telephone Encounter (Signed)
 Spoke with the patient, detailed instructions given. S.Sueo Cullen CCT

## 2024-06-01 ENCOUNTER — Other Ambulatory Visit: Payer: Self-pay | Admitting: Cardiovascular Disease

## 2024-06-01 DIAGNOSIS — I2510 Atherosclerotic heart disease of native coronary artery without angina pectoris: Secondary | ICD-10-CM

## 2024-06-05 ENCOUNTER — Ambulatory Visit (HOSPITAL_COMMUNITY)
Admission: RE | Admit: 2024-06-05 | Discharge: 2024-06-05 | Disposition: A | Discharge status: Home / Self Care | Source: Ambulatory Visit | Attending: Cardiovascular Disease | Admitting: Cardiovascular Disease

## 2024-06-05 DIAGNOSIS — I2510 Atherosclerotic heart disease of native coronary artery without angina pectoris: Secondary | ICD-10-CM | POA: Insufficient documentation

## 2024-06-05 LAB — MYOCARDIAL PERFUSION IMAGING
Base ST Depression (mm): 0 mm
LV dias vol: 43 mL (ref 46–106)
LV sys vol: 7 mL
Nuc Stress EF: 84 %
Peak HR: 130 {beats}/min
Rest HR: 75 {beats}/min
Rest Nuclear Isotope Dose: 11 mCi
SDS: 0
SRS: 1
SSS: 0
ST Depression (mm): 0 mm
Stress Nuclear Isotope Dose: 32.7 mCi
TID: 1.16

## 2024-06-05 MED ORDER — REGADENOSON 0.4 MG/5ML IV SOLN
0.4000 mg | Freq: Once | INTRAVENOUS | Status: AC
  Administered 2024-06-05: 0.4 mg via INTRAVENOUS

## 2024-06-05 MED ORDER — TECHNETIUM TC 99M TETROFOSMIN IV KIT
32.7000 | PACK | Freq: Once | INTRAVENOUS | Status: AC | PRN
  Administered 2024-06-05: 32.7 via INTRAVENOUS

## 2024-06-05 MED ORDER — REGADENOSON 0.4 MG/5ML IV SOLN
INTRAVENOUS | Status: AC
  Filled 2024-06-05: qty 5

## 2024-06-05 MED ORDER — TECHNETIUM TC 99M TETROFOSMIN IV KIT
11.0000 | PACK | Freq: Once | INTRAVENOUS | Status: AC | PRN
  Administered 2024-06-05: 11 via INTRAVENOUS

## 2024-06-06 ENCOUNTER — Ambulatory Visit: Payer: Self-pay | Admitting: Cardiovascular Disease

## 2024-06-06 ENCOUNTER — Encounter: Payer: Self-pay | Admitting: Family Medicine

## 2024-06-06 ENCOUNTER — Other Ambulatory Visit: Payer: Self-pay | Admitting: Family Medicine

## 2024-06-06 DIAGNOSIS — Z794 Long term (current) use of insulin: Secondary | ICD-10-CM

## 2024-06-06 DIAGNOSIS — E1169 Type 2 diabetes mellitus with other specified complication: Secondary | ICD-10-CM

## 2024-06-06 NOTE — Telephone Encounter (Signed)
 Please call and schedule CPE with fasting labs prior after 09/01/2024 for Dr. Avelina.

## 2024-06-06 NOTE — Progress Notes (Unsigned)
 Please call and schedule Annual Physical with fasting labs prior for Dr. Avelina after 09/01/2024.

## 2024-06-08 ENCOUNTER — Ambulatory Visit (HOSPITAL_COMMUNITY)

## 2024-06-08 MED ORDER — LOSARTAN POTASSIUM 25 MG PO TABS: 25.0000 mg | ORAL_TABLET | Freq: Every day | ORAL | 3 refills | Status: AC

## 2024-06-14 ENCOUNTER — Encounter: Payer: Self-pay | Admitting: Neurology

## 2024-06-19 ENCOUNTER — Other Ambulatory Visit: Payer: Self-pay | Admitting: Family Medicine

## 2024-06-19 NOTE — Telephone Encounter (Signed)
 Last office visit 04/05/2024 for osteoporosis.  Last refilled 07/19/2023 for #12 with 3 refills. Vit D level 12/29/2022 which was normal at 49.39 ng/mL.  Next appt: CPE 09/04/2024.

## 2024-06-21 ENCOUNTER — Encounter: Payer: Self-pay | Admitting: Internal Medicine

## 2024-06-21 ENCOUNTER — Ambulatory Visit (INDEPENDENT_AMBULATORY_CARE_PROVIDER_SITE_OTHER): Admitting: Internal Medicine

## 2024-06-21 VITALS — BP 156/80 | Ht 60.0 in | Wt 117.0 lb

## 2024-06-21 DIAGNOSIS — E1165 Type 2 diabetes mellitus with hyperglycemia: Secondary | ICD-10-CM

## 2024-06-21 DIAGNOSIS — Z794 Long term (current) use of insulin: Secondary | ICD-10-CM

## 2024-06-21 LAB — POCT GLYCOSYLATED HEMOGLOBIN (HGB A1C): Hemoglobin A1C: 7.6 % — AB (ref 4.0–5.6)

## 2024-06-21 MED ORDER — INSULIN PEN NEEDLE 32G X 4 MM MISC: 1.0000 | Freq: Four times a day (QID) | 3 refills | Status: AC

## 2024-06-21 MED ORDER — LANTUS SOLOSTAR 100 UNIT/ML ~~LOC~~ SOPN: 4.0000 [IU] | PEN_INJECTOR | Freq: Every day | SUBCUTANEOUS | 3 refills | Status: AC

## 2024-06-21 MED ORDER — REPAGLINIDE 2 MG PO TABS: ORAL_TABLET | ORAL | 3 refills | Status: AC

## 2024-06-21 NOTE — Progress Notes (Signed)
 " Name: Debra Leach  MRN/ DOB: [id number], 08-17-1950   Age/ Sex: 74 y.o., female    PCP: Avelina Greig BRAVO, MD   Reason for Endocrinology Evaluation: Type 2 Diabetes Mellitus     Date of Initial Endocrinology Visit: 07/10/2021    PATIENT IDENTIFIER: Debra Leach is a 74 y.o. female with a past medical history of DM, renal stones, Autoimmune pancreatitis . The patient presented for initial endocrinology clinic visit on 07/10/2021 for consultative assistance with her diabetes management.    HPI: Debra Leach was    Diagnosed with DM 2012 Prior Medications tried/Intolerance: Metformin - GI side effects .  Currently checking blood sugars 2 x / day Hemoglobin A1c has ranged from 6.7% in 2019, peaking at 9.5% in 2022.    Has limited diet due to migraine headaches she has to eat every 3 hours otherwise her headaches will worsen  She is on long-term glucocorticoids for autoimmune pancreatitis for a long term  started  on 08/2017   On her initial visit to our clinic she had an A1c of 9.5%, she is intolerant to Jardiance  due to recurrent UTIs.  We started her on repaglinide   She was also diagnosed with MNG a few years ago with last ultrasound 04/2020, thyroid  nodules did not meet criteria for further follow-up.  ADRENAL HISTORY: The patient has been on budesonide  for many years due to autoimmune pancreatitis.  She presented to the ED in March, 2024 with sepsis secondary to UTI and cellulitis and was noted to have a low serum cortisol which prompted cosyntropin  stimulation test, and she was discharged on hydrocortisone   Her cosyntropin  stimulation test  result is INVALID , due to chronic corticosteroid intake.  Patient does not need any additional glucocorticoids as she is already on budesonide    Patient was prescribed prandial insulin  per correction scale while on prednisone  in June, 2025   THYROID  HISTORY  In reviewing her records, patient has been noted with low TSH  at 0.13 u IU/mL with normal free T3 and T4 in 04/2023 .  She does have history of subcentimeter thyroid  nodules that did not meet criteria for follow-up.  Repeat TFT's normal 05/2023    SUBJECTIVE:   During the last visit (02/15/2024): A1c 7.1%    Today (06/21/24): Debra Leach is here for follow-up on diabetes management.  She checks her glucose multiple times daily.Has noted hypoglycemia, she is symptomatic with these episodes.   She has been evaluated by neurology for bilateral lower extremity weakness, she already follows with them for migraine headaches She continues to follow-up with GI for autoimmune pancreatitis  She continues with weight loss  No nausea for months   No diarrhea, she did have transient constipation a few weeks ago but that has resolved  Has a plantar wart   HOME ENDOCRINE  REGIMEN: Lantus  6 units daily  Repaglinide  2 mg, 1 tabs before Breakfast, 1 mg before Lunch and 2 mg before Supper     Statin: INtolerant to crestor  ACE-I/ARB: yes Prior Diabetic Education: no    CONTINUOUS GLUCOSE MONITORING RECORD INTERPRETATION                          DIABETIC COMPLICATIONS: Microvascular complications:  CKDIII Denies: Neuropathy, retinopathy Last eye exam: Completed   Macrovascular complications:   Denies: CAD, PVD, CVA   PAST HISTORY: Past Medical History:  Past Medical History:  Diagnosis Date   Anemia    Arthritis  osteoarthritis B knees, left hip , right elbow   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HCC)    Cataract    Chronic kidney disease    Clotting disorder    Complication of anesthesia    difficult waking   Diabetes mellitus without complication (HCC)    Diet and exercise controlled   Diverticulosis    DVT (deep venous thrombosis) (HCC) 02/2019   3calf 2 right lung 1 left lung   DVT (deep venous thrombosis) (HCC) 2020   right leg   Dyspnea    GERD (gastroesophageal reflux disease)    Headache    Hepatic steatosis     Hiatal hernia    History of kidney stones    Hyperlipidemia    Hypertension    MVP (mitral valve prolapse)    history of   PONV (postoperative nausea and vomiting)    Pulmonary emboli (HCC) 03/01/2019   2 in the right; 1 in the left   Past Surgical History:  Past Surgical History:  Procedure Laterality Date   arm surgery  2012   left forearm fracture w/ plates pins screws   ARTERY BIOPSY Right 12/03/2021   Procedure: BIOPSY TEMPORAL ARTERY;  Surgeon: Marea Selinda RAMAN, MD;  Location: [facility] ORS;  Service: Vascular;  Laterality: Right;  Right temple   BREAST BIOPSY Left    BREAST SURGERY  2000   breast biopsy (benign)   CATARACT EXTRACTION Right 2005   CATARACT EXTRACTION Left 2010   LITHOTRIPSY  08/12/2008   stent placed bilaterally   PANCREAS BIOPSY  2021   PANCREAS BIOPSY  2019   PARTIAL HYSTERECTOMY     Both ovaries remain, vaginal, for mennorhagia   REPAIR EXTENSOR TENDON Right 06/30/2022   Procedure: Middle and ring finger sagittal band reconstruction;  Surgeon: Romona Harari, MD;  Location: [facility];  Service: Orthopedics;  Laterality: Right;   TENDON REPAIR Left 04/06/2023   Procedure: MIDDLE AND RING FINGER SAGITTAL BAND RECONSTRUCTION;  Surgeon: Romona Harari, MD;  Location: [facility];  Service: Orthopedics;  Laterality: Left;  regional120   TONSILLECTOMY     TUBAL LIGATION  1980    Social History:  reports that she quit smoking about 33 years ago. Her smoking use included cigarettes. She started smoking about 46 years ago. She has a 13 pack-year smoking history. She has never been exposed to tobacco smoke. She has never used smokeless tobacco. She reports that she does not drink alcohol and does not use drugs. Family History:  Family History  Problem Relation Age of Onset   Bone cancer Mother    Hypertension Father    Mitral valve prolapse Father    Colon polyps Brother    Asthma Brother    Arthritis Brother    Nephrolithiasis Brother    Asthma  Brother    COPD Brother    Breast cancer Maternal Aunt    Breast cancer Maternal Aunt    Colon cancer Neg Hx    Esophageal cancer Neg Hx    Inflammatory bowel disease Neg Hx    Liver disease Neg Hx    Pancreatic cancer Neg Hx    Rectal cancer Neg Hx    Stomach cancer Neg Hx      HOME MEDICATIONS: Allergies as of 06/21/2024       Reactions   Clindamycin/lincomycin Anaphylaxis   Atenolol  Other (See Comments)   Migraine   Codeine Itching, Swelling   REACTION: Migraine   Epinephrine  Itching   Hydrocodone Other (See Comments)  Migraine   Ibuprofen Other (See Comments)   Migraine   Jardiance  [empagliflozin ] Other (See Comments)   Caused uti Yeast infection   Metronidazole  Swelling   Mouth and lips   Oxycodone Other (See Comments)   Migraine   Penicillins Itching, Swelling   ** Tolerates cephalosporins Has patient had a PCN reaction causing immediate rash, facial/tongue/throat swelling, SOB or lightheadedness with hypotension: No Has patient had a PCN reaction causing severe rash involving mucus membranes or skin necrosis: No Has patient had a PCN reaction that required hospitalization: No Has patient had a PCN reaction occurring within the last 10 years: No   Promethazine  Other (See Comments)   migraine   Propranolol  Other (See Comments)   Migraine   Sudafed [pseudoephedrine Hcl] Hives, Itching, Anxiety   Talwin [pentazocine] Other (See Comments)   Swelling and itching    Tramadol Other (See Comments)   Migraine   Vancomycin  Swelling   Swelling of Lips and Mouth   Crestor [rosuvastatin] Other (See Comments)   Muscle pain        Medication List        Accurate as of June 21, 2024 10:48 AM. If you have any questions, ask your nurse or doctor.          Accu-Chek FastClix Lancets Misc Check blood sugar twice daily   Accu-Chek Guide Me w/Device Kit Check blood sugar twice daily   Accu-Chek Guide Test test strip Generic drug: glucose blood Check  blood sugar twice daily   aluminum-magnesium  hydroxide 200-200 MG/5ML suspension Take by mouth every 6 (six) hours as needed for indigestion. As needed   atorvastatin  80 MG tablet Commonly known as: LIPITOR  TAKE 1 TABLET BY MOUTH DAILY   budesonide  3 MG 24 hr capsule Commonly known as: ENTOCORT EC  TAKE 2 CAPSULES BY MOUTH DAILY.   colesevelam  625 MG tablet Commonly known as: WELCHOL  TAKE 3 TABLETS BY MOUTH 2 TIMES DAILY WITH A MEAL.   cyanocobalamin  1000 MCG tablet Commonly known as: VITAMIN B12 Take 1,000 mcg by mouth daily with breakfast.   Dexcom G7 Sensor Misc 1 Device by Does not apply route as directed.   Eliquis  2.5 MG Tabs tablet Generic drug: apixaban  TAKE 1 TABLET BY MOUTH 2 TIMES DAILY.   famotidine  10 MG tablet Commonly known as: PEPCID  Take 10 mg by mouth 2 (two) times daily.   ferrous gluconate  324 MG tablet Commonly known as: FERGON Take 1 tablet (324 mg total) by mouth daily with breakfast.   gabapentin  100 MG capsule Commonly known as: NEURONTIN  TAKE 1 CAPSULE BY MOUTH UP TO 3 TIMES DAILY AS NEEDED. What changed: See the new instructions.   gabapentin  300 MG capsule Commonly known as: NEURONTIN  TAKE 2 CAPSULES (600 MG TOTAL) BY MOUTH AT BEDTIME. What changed: Another medication with the same name was changed. Make sure you understand how and when to take each.   Insulin  Pen Needle 32G X 4 MM Misc 1 Device by Does not apply route in the morning, at noon, in the evening, and at bedtime.   Lantus  SoloStar 100 UNIT/ML Solostar Pen Generic drug: insulin  glargine Inject 10 Units into the skin daily. What changed: how much to take   losartan  25 MG tablet Commonly known as: COZAAR  Take 1 tablet (25 mg total) by mouth daily.   meclizine  25 MG tablet Commonly known as: ANTIVERT  TAKE 1 TABLET BY MOUTH 3 TIMES DAILY AS NEEDED FOR DIZZINESS.   Nasacort Allergy 24HR 55 MCG/ACT Aero nasal inhaler Generic  drug: triamcinolone Place 2 sprays into the  nose daily.   ondansetron  8 MG tablet Commonly known as: ZOFRAN  Take 1 tablet (8 mg total) by mouth every 8 (eight) hours as needed for nausea or vomiting.   pantoprazole  40 MG tablet Commonly known as: PROTONIX  TAKE 1 TABLET (40 MG TOTAL) BY MOUTH DAILY.   potassium chloride  SA 20 MEQ tablet Commonly known as: KLOR-CON  M TAKE 1 TABLET BY MOUTH DAILY.   repaglinide  2 MG tablet Commonly known as: PRANDIN  Take 2 mg with breakfast, 2 mg with lunch, and 4 mg with supper   Ubrelvy  100 MG Tabs Generic drug: Ubrogepant  TAKE 1 TABLET BY MOUTH AS NEEDED (MAY REPEAT DOSE AFTER 2 HOURS IF NEEDED. MAXIMUM 2 TABLETS IN 24 HOURS).   Vitamin D  (Ergocalciferol ) 1.25 MG (50000 UNIT) Caps capsule Commonly known as: DRISDOL  TAKE 1 CAPSULE BY MOUTH ONCE A WEEK.   Vyepti 100 MG/ML injection Generic drug: Eptinezumab-jjmr Inject 300 mg into the vein every 3 (three) months.         ALLERGIES: Allergies  Allergen Reactions   Clindamycin/Lincomycin Anaphylaxis   Atenolol  Other (See Comments)    Migraine   Codeine Itching and Swelling    REACTION: Migraine   Epinephrine  Itching   Hydrocodone Other (See Comments)    Migraine   Ibuprofen Other (See Comments)    Migraine   Jardiance  [Empagliflozin ] Other (See Comments)    Caused uti Yeast infection   Metronidazole  Swelling    Mouth and lips   Oxycodone Other (See Comments)    Migraine   Penicillins Itching and Swelling    ** Tolerates cephalosporins Has patient had a PCN reaction causing immediate rash, facial/tongue/throat swelling, SOB or lightheadedness with hypotension: No Has patient had a PCN reaction causing severe rash involving mucus membranes or skin necrosis: No Has patient had a PCN reaction that required hospitalization: No Has patient had a PCN reaction occurring within the last 10 years: No     Promethazine  Other (See Comments)    migraine   Propranolol  Other (See Comments)    Migraine   Sudafed [Pseudoephedrine  Hcl] Hives, Itching and Anxiety   Talwin [Pentazocine] Other (See Comments)    Swelling and itching    Tramadol Other (See Comments)    Migraine   Vancomycin  Swelling    Swelling of Lips and Mouth   Crestor [Rosuvastatin] Other (See Comments)    Muscle pain     REVIEW OF SYSTEMS: A comprehensive ROS was conducted with the patient and is negative except as per HPI    OBJECTIVE:   VITAL SIGNS: BP (!) 156/80   Ht 5' (1.524 m)   Wt 117 lb (53.1 kg)   BMI 22.85 kg/m    PHYSICAL EXAM:  General: Pt appears well and is in NAD  Lungs: Clear with good BS bilat   Heart: RRR   Extremities:  Lower extremities - no pretibial edema.  Neuro: MS is good with appropriate affect, pt is alert and Ox3    DM foot exam: 06/21/2024  The skin of the feet is intact without sores or ulcerations. The pedal pulses are 2+ on right and 2+ on left. The sensation is intact to a screening 5.07, 10 gram monofilament bilaterally   DATA REVIEWED:  Lab Results  Component Value Date   HGBA1C 7.6 (A) 06/21/2024   HGBA1C 7.1 (A) 02/15/2024   HGBA1C 8.2 (H) 08/26/2023    Latest Reference Range & Units 01/31/24 10:39  Sodium 135 -  145 mEq/L 144  Potassium 3.5 - 5.1 mEq/L 3.6  Chloride 96 - 112 mEq/L 105  CO2 19 - 32 mEq/L 30  Glucose 70 - 99 mg/dL 899 (H)  BUN 6 - 23 mg/dL 11  Creatinine 9.59 - 8.79 mg/dL 9.18  Calcium  8.4 - 10.5 mg/dL 8.8  Alkaline Phosphatase 39 - 117 U/L 61  Albumin 3.5 - 5.2 g/dL 3.8  Lipase 88.9 - 40.9 U/L 10.0 (L)  AST 0 - 37 U/L 18  ALT 0 - 35 U/L 19  Total Protein 6.0 - 8.3 g/dL 6.8  Total Bilirubin 0.2 - 1.2 mg/dL 0.6  GFR >39.99 mL/min 72.36     Thyroid  Ultrasound 04/26/2023    Narrative & Impression  CLINICAL DATA:  Goiter.  Hoarseness   EXAM: THYROID  ULTRASOUND   TECHNIQUE: Ultrasound examination of the thyroid  gland and adjacent soft tissues was performed.   COMPARISON:  Prior thyroid  ultrasound 04/17/2020   FINDINGS: Parenchymal Echotexture:  Mildly heterogenous   Isthmus: 0.3 cm   Right lobe: 4.7 x 1.0 x 1.4 cm   Left lobe: 4.2 x 1.2 x 1.3 cm   _________________________________________________________   Estimated total number of nodules >/= 1 cm: 3   Number of spongiform nodules >/=  2 cm not described below (TR1): 0   Number of mixed cystic and solid nodules >/= 1.5 cm not described below (TR2): 0   _________________________________________________________   Small heterogeneous thyroid  gland. Innumerable small cysts and nodules again noted throughout the gland.   No significant interval change in the size or appearance of any individual nodule dating back to 2021. as before, no individual nodule meets criteria to warrant biopsy or dedicated imaging surveillance.   IMPRESSION: No significant interval change in the size or appearance of the small, heterogeneous and multinodular thyroid  gland.     ASSESSMENT / PLAN / RECOMMENDATIONS:   1) Type 2 Diabetes Mellitus, Optimally  Controlled, With fluctuating GFR  - Most recent A1c of 7.6%. Goal A1c < 7.0 %.    -A1c has slightly increased -Patient is not able to follow a low-carb diet due to autoimmune pancreatitis,chronic GI symptoms, and migraine headaches which limits her dietary options.   -Our goal is to prevent hypoglycemia and severe hyperglycemia - Pt with hx of pancreatitis hence GLP-1 agonist and DPP 4 inhibitors as well as Tirzepatide are all contraindicated - Intolerant to Jardiance  due to recurrent genital infections  - Patient continues with hypoglycemia overnight, will continue to decrease Lantus  - I will also increase repaglinide  as below due to postprandial hyperglycemia  MEDICATIONS: -Decrease Lantus  4 units daily -Change Repaglinidie 2 mg, 2 tab before breakfast, 1 tab before Lunch , 2 tabs before supper  EDUCATION / INSTRUCTIONS: BG monitoring instructions: Patient is instructed to check her blood sugars 1 times a day, fasting. Call [facility] [place]  [facility] if: BG persistently < 70  I reviewed the Rule of 15 for the treatment of hypoglycemia in detail with the patient. Literature supplied.   2) Diabetic complications:  Eye: Does not have known diabetic retinopathy.  Neuro/ Feet: Does not have known diabetic peripheral neuropathy. Renal: Patient does  have known baseline CKD. She is  on an ACEI/ARB at present.    Follow-up in 4 months      Signed electronically by: Stefano Redgie Butts, MD  [facility] Endocrinology  [facility] [address] [place], [place] [address] Phone: [phone] FAX: [phone]   CC: Avelina Greig BRAVO, MD [address]  [address] [place] [address] Phone: [phone]  Fax: [phone]    Return to Endocrinology clinic as below: Future Appointments  Date Time Provider Department Center  07/06/2024  9:45 AM Teresa Dorsey BRAVO, [facility] H&V  08/03/2024  8:45 AM Patel, Donika K, DO [facility] None  08/06/2024  3:50 PM Skeet Juliene SAUNDERS, DO [facility] None  08/08/2024  8:10 AM [facility] ANNUAL WELLNESS VISIT 1 [facility] [address]  09/04/2024  9:20 AM Avelina Greig BRAVO, MD [facility] [address]    "

## 2024-06-21 NOTE — Patient Instructions (Addendum)
-   Decrease  Lantus  4 units daily  - Change  Repaglinide  2 mg, 2 tablets  before Breakfast,  1 tablet  before Lunch  and 2 tablets before Supper     HOW TO TREAT LOW BLOOD SUGARS (Blood sugar LESS THAN 70 MG/DL) Please follow the RULE OF 15 for the treatment of hypoglycemia treatment (when your (blood sugars are less than 70 mg/dL)   STEP 1: Take 15 grams of carbohydrates when your blood sugar is low, which includes:  3-4 GLUCOSE TABS  OR 3-4 OZ OF JUICE OR REGULAR SODA OR ONE TUBE OF GLUCOSE GEL    STEP 2: RECHECK blood sugar in 15 MINUTES STEP 3: If your blood sugar is still low at the 15 minute recheck --> then, go back to STEP 1 and treat AGAIN with another 15 grams of carbohydrates.

## 2024-07-04 ENCOUNTER — Other Ambulatory Visit: Payer: Self-pay

## 2024-07-04 ENCOUNTER — Ambulatory Visit
Admission: EM | Admit: 2024-07-04 | Discharge: 2024-07-04 | Disposition: A | Discharge status: Home / Self Care | Attending: Physician Assistant | Admitting: Physician Assistant

## 2024-07-04 DIAGNOSIS — S81811A Laceration without foreign body, right lower leg, initial encounter: Secondary | ICD-10-CM

## 2024-07-04 MED ORDER — MUPIROCIN 2 % EX OINT: 1.0000 | TOPICAL_OINTMENT | Freq: Two times a day (BID) | CUTANEOUS | 0 refills | Status: AC

## 2024-07-04 NOTE — ED Triage Notes (Signed)
 Pt presents with a chief complaint of right lower leg laceration. States she ran into a metal frame this morning at approximately 11 AM. Bleeding is controlled in triage room. Has non-adherent pad + guaze applied PTA. Takes Eliquis  twice a day. Last TDAP was in 2023 per pt.

## 2024-07-04 NOTE — Discharge Instructions (Addendum)
 You were seen today for a laceration along your right lower leg.  We were able to cleanse the wound as well as close it with 10, simple interrupted sutures as well as 3 Steri-Strips.  You will need to return in the next 7 to 10 days to have the Steri-Strips and sutures removed to ensure that the wound is healing appropriately.  To help prevent infection I am sending you home with an antibiotic ointment called mupirocin .  You can apply this to the area twice per day for at least 7 days. I would recommend keeping the dressing that I placed on it for the next 24 hours and tomorrow morning you can take it off and cleanse the wound.  If needed you can soak or slightly wet the adhesive gauze to help it come off and not rip your skin. You can use warm water and a gentle soap to help cleanse the area.  Please do not scrub the wound or use harsh cleanser such as peroxide or alcohol as this can cause further irritation.  If you notice any of the following symptoms please return or follow-up with your PCP: Swelling around the wound, the wound reopens, there is profuse bleeding or drainage that looks like pus, severe leg pain, difficulty moving your leg or walking.

## 2024-07-04 NOTE — ED Provider Notes (Signed)
 [facility]    CSN: [id number] Arrival date & time: 07/04/24  1215      History   Chief Complaint Chief Complaint  Patient presents with   Laceration    HPI Debra Leach is a 74 y.o. female.   HPI  Patient presents today with concerns for a laceration to the right lower leg that she sustained at around 11 AM today, 07/04/2024.  She reports that she hit her leg on the corner of a bed frame and this caused a significant laceration.  She does state that she is on long-term steroids and is also taking Eliquis . Per chart review her most recent tetanus booster was administered on 04/09/2021.  Patient does report that she has a history of C. difficile and would prefer not to have an oral antibiotic unless absolutely indicated.  She reports that she would prefer to try topical first to see if progression is needed to an oral from there.   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HCC)    Cataract    Chronic kidney disease    Clotting disorder    Complication of anesthesia    difficult waking   Diabetes mellitus without complication (HCC)    Diet and exercise controlled   Diverticulosis    DVT (deep venous thrombosis) (HCC) 02/2019   3calf 2 right lung 1 left lung   DVT (deep venous thrombosis) (HCC) 2020   right leg   Dyspnea    GERD (gastroesophageal reflux disease)    Headache    Hepatic steatosis    Hiatal hernia    History of kidney stones    Hyperlipidemia    Hypertension    MVP (mitral valve prolapse)    history of   PONV (postoperative nausea and vomiting)    Pulmonary emboli (HCC) 03/01/2019   2 in the right; 1 in the left    Patient Active Problem List   Diagnosis Date Noted   Flank pain 01/31/2024   RUQ pain 01/31/2024   Facial pain 11/15/2023   Hoarseness, persistent 04/22/2023   History of Clostridioides  difficile infection 02/13/2023   Chronic anticoagulation 02/13/2023   Acute diarrhea 09/21/2022   Current chronic use of systemic steroids 09/02/2022   Chronic kidney disease, stage 3a (HCC) 08/19/2022   Pre-operative examination 05/11/2022   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   Colon cancer screening 02/14/2022   Immunosuppression due to chronic steroid use 02/13/2022   Iron  deficiency anemia 02/13/2022   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   Type 2 diabetes mellitus with stage 3a chronic kidney disease, with long-term current use of insulin  (HCC) 07/10/2021   Multinodular goiter 07/10/2021   Spondylolisthesis at L4-L5 level 04/24/2021   Thyroid  nodule 04/11/2020   Actinic keratosis 09/26/2019   Dyspnea on minimal exertion 07/13/2019   Hypertension associated with diabetes (HCC) 07/06/2019   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HCC) 07/06/2019   Multiple thyroid  nodules 03/27/2019   History of DVT (deep vein thrombosis) 03/13/2019   History of pulmonary embolism 03/01/2019   Breast nodule 03/01/2019   Abnormal renal finding 06/29/2018   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HCC) 08/16/2017   Eustachian tube disorder, bilateral 12/16/2016   Esophageal dysphagia 11/22/2016   Chronic low back pain 10/07/2016   Aortic atherosclerosis 06/11/2016   Family history of aortic aneurysm 08/20/2014   Vitamin D  deficiency 08/20/2014   Osteoporosis 11/06/2009  Hyperlipidemia associated with type 2 diabetes mellitus (HCC) 07/05/2008   INSOMNIA, CHRONIC 07/05/2008   Migraine with aura 07/05/2008   GERD 07/05/2008   Osteoarthritis 07/05/2008   MITRAL VALVE PROLAPSE, HX OF 07/05/2008   NEPHROLITHIASIS, HX OF 07/05/2008    Past Surgical History:  Procedure Laterality Date   arm surgery  2012   left forearm fracture w/ plates pins screws   ARTERY BIOPSY Right 12/03/2021   Procedure: BIOPSY TEMPORAL ARTERY;  Surgeon: Marea Selinda RAMAN, MD;  Location:  [facility];  Service: Vascular;  Laterality: Right;  Right temple   BREAST BIOPSY Left    BREAST SURGERY  2000   breast biopsy (benign)   CATARACT EXTRACTION Right 2005   CATARACT EXTRACTION Left 2010   LITHOTRIPSY  08/12/2008   stent placed bilaterally   PANCREAS BIOPSY  2021   PANCREAS BIOPSY  2019   PARTIAL HYSTERECTOMY     Both ovaries remain, vaginal, for mennorhagia   REPAIR EXTENSOR TENDON Right 06/30/2022   Procedure: Middle and ring finger sagittal band reconstruction;  Surgeon: Romona Harari, MD;  Location: [facility];  Service: Orthopedics;  Laterality: Right;   TENDON REPAIR Left 04/06/2023   Procedure: MIDDLE AND RING FINGER SAGITTAL BAND RECONSTRUCTION;  Surgeon: Romona Harari, MD;  Location: [facility];  Service: Orthopedics;  Laterality: Left;  regional120   TONSILLECTOMY     TUBAL LIGATION  1980    OB History   No obstetric history on file.      Home Medications    Prior to Admission medications  Medication Sig Start Date End Date Taking? Authorizing Provider  mupirocin  ointment (BACTROBAN ) 2 % Apply 1 Application topically 2 (two) times daily for 7 days. 07/04/24 07/11/24 Yes Mellony Danziger E, PA-C  Accu-Chek FastClix Lancets MISC Check blood sugar twice daily 12/15/23   Shamleffer, Ibtehal Jaralla, MD  aluminum-magnesium  hydroxide 200-200 MG/5ML suspension Take by mouth every 6 (six) hours as needed for indigestion. As needed    [provider]  apixaban  (ELIQUIS ) 2.5 MG TABS tablet TAKE 1 TABLET BY MOUTH 2 TIMES DAILY. 10/14/23   Bedsole, Amy E, MD  atorvastatin  (LIPITOR ) 80 MG tablet TAKE 1 TABLET BY MOUTH DAILY 10/14/23   Bedsole, Amy E, MD  Blood Glucose Monitoring Suppl (ACCU-CHEK GUIDE ME) w/Device KIT Check blood sugar twice daily 12/15/23   Shamleffer, Ibtehal Jaralla, MD  budesonide  (ENTOCORT EC ) 3 MG 24 hr capsule TAKE 2 CAPSULES BY MOUTH DAILY. 05/28/24   Mansouraty, Aloha Raddle., MD  colesevelam  (WELCHOL ) 625 MG tablet TAKE 3  TABLETS BY MOUTH 2 TIMES DAILY WITH A MEAL. 01/04/24   Bedsole, Amy E, MD  Continuous Glucose Sensor (DEXCOM G7 SENSOR) MISC 1 Device by Does not apply route as directed. 07/13/23   Shamleffer, Ibtehal Jaralla, MD  Eptinezumab-jjmr (VYEPTI) 100 MG/ML injection Inject 300 mg into the vein every 3 (three) months.    [provider]  famotidine  (PEPCID ) 10 MG tablet Take 10 mg by mouth 2 (two) times daily.    [provider]  ferrous gluconate  (FERGON) 324 MG tablet Take 1 tablet (324 mg total) by mouth daily with breakfast. 03/29/24   Bedsole, Amy E, MD  gabapentin  (NEURONTIN ) 100 MG capsule TAKE 1 CAPSULE BY MOUTH UP TO 3 TIMES DAILY AS NEEDED. Patient taking differently: Take 100 mg by mouth 3 (three) times daily. TAKE 1 CAPSULE BY MOUTH UP TO 3 TIMES DAILY AS NEEDED. 01/05/24   Skeet, Adam R, DO  gabapentin  (NEURONTIN ) 300 MG  capsule TAKE 2 CAPSULES (600 MG TOTAL) BY MOUTH AT BEDTIME. 04/10/24   Skeet, Adam R, DO  glucose blood (ACCU-CHEK GUIDE TEST) test strip Check blood sugar twice daily 12/15/23   Shamleffer, Ibtehal Jaralla, MD  insulin  glargine (LANTUS  SOLOSTAR) 100 UNIT/ML Solostar Pen Inject 4 Units into the skin daily. 06/21/24   Shamleffer, Ibtehal Jaralla, MD  Insulin  Pen Needle 32G X 4 MM MISC 1 Device by Does not apply route in the morning, at noon, in the evening, and at bedtime. 06/21/24   Shamleffer, Ibtehal Jaralla, MD  losartan  (COZAAR ) 25 MG tablet Take 1 tablet (25 mg total) by mouth daily. 06/08/24 09/06/24  O'NealDarryle Ned, MD  meclizine  (ANTIVERT ) 25 MG tablet TAKE 1 TABLET BY MOUTH 3 TIMES DAILY AS NEEDED FOR DIZZINESS. 02/09/24   Skeet Juliene SAUNDERS, DO  ondansetron  (ZOFRAN ) 8 MG tablet Take 1 tablet (8 mg total) by mouth every 8 (eight) hours as needed for nausea or vomiting. 10/05/23   Mansouraty, Aloha Raddle., MD  pantoprazole  (PROTONIX ) 40 MG tablet TAKE 1 TABLET (40 MG TOTAL) BY MOUTH DAILY. 04/27/24   Bedsole, Amy E, MD  potassium chloride  SA (KLOR-CON  M) 20 MEQ  tablet TAKE 1 TABLET BY MOUTH DAILY. 10/14/23   Bedsole, Amy E, MD  repaglinide  (PRANDIN ) 2 MG tablet Take 4 mg with breakfast, 2 mg with lunch, and 4 mg with supper 06/21/24   Shamleffer, Ibtehal Jaralla, MD  triamcinolone (NASACORT ALLERGY 24HR) 55 MCG/ACT AERO nasal inhaler Place 2 sprays into the nose daily.    [provider]  UBRELVY  100 MG TABS TAKE 1 TABLET BY MOUTH AS NEEDED (MAY REPEAT DOSE AFTER 2 HOURS IF NEEDED. MAXIMUM 2 TABLETS IN 24 HOURS). 12/15/23   Skeet Juliene SAUNDERS, DO  vitamin B-12 (CYANOCOBALAMIN ) 1000 MCG tablet Take 1,000 mcg by mouth daily with breakfast. 08/15/20   [provider]  Vitamin D , Ergocalciferol , (DRISDOL ) 1.25 MG (50000 UNIT) CAPS capsule TAKE 1 CAPSULE BY MOUTH ONCE A WEEK. 06/19/24   Bedsole, Amy E, MD    Family History Family History  Problem Relation Age of Onset   Bone cancer Mother    Hypertension Father    Mitral valve prolapse Father    Colon polyps Brother    Asthma Brother    Arthritis Brother    Nephrolithiasis Brother    Asthma Brother    COPD Brother    Breast cancer Maternal Aunt    Breast cancer Maternal Aunt    Colon cancer Neg Hx    Esophageal cancer Neg Hx    Inflammatory bowel disease Neg Hx    Liver disease Neg Hx    Pancreatic cancer Neg Hx    Rectal cancer Neg Hx    Stomach cancer Neg Hx     Social History Social History[1]   Allergies   Clindamycin/lincomycin, Atenolol , Codeine, Epinephrine , Hydrocodone, Ibuprofen, Jardiance  [empagliflozin ], Metronidazole , Oxycodone, Penicillins, Promethazine , Propranolol , Sudafed [pseudoephedrine hcl], Talwin [pentazocine], Tramadol, Vancomycin , and Crestor [rosuvastatin]   Review of Systems Review of Systems  Skin:  Positive for wound.     Physical Exam Triage Vital Signs ED Triage Vitals  Encounter Vitals Group     BP 07/04/24 1232 (!) 151/77     Girls Systolic BP Percentile --      Girls Diastolic BP Percentile --      Boys Systolic BP  Percentile --      Boys Diastolic BP Percentile --      Pulse Rate 07/04/24 1232 94  Resp 07/04/24 1232 16     Temp 07/04/24 1232 98.1 F (36.7 C)     Temp Source 07/04/24 1232 Oral     SpO2 07/04/24 1232 96 %     Weight --      Height --      Head Circumference --      Peak Flow --      Pain Score 07/04/24 1230 5     Pain Loc --      Pain Education --      Exclude from Growth Chart --    No data found.  Updated Vital Signs BP (!) 151/77 (BP Location: Right Arm)   Pulse 94   Temp 98.1 F (36.7 C) (Oral)   Resp 16   SpO2 96%   Visual Acuity Right Eye Distance:   Left Eye Distance:   Bilateral Distance:    Right Eye Near:   Left Eye Near:    Bilateral Near:     Physical Exam Vitals reviewed.  Constitutional:      General: She is awake.     Appearance: Normal appearance. She is well-developed and well-groomed.  HENT:     Head: Normocephalic and atraumatic.  Eyes:     General: Lids are normal. Gaze aligned appropriately.     Extraocular Movements: Extraocular movements intact.     Conjunctiva/sclera: Conjunctivae normal.  Pulmonary:     Effort: Pulmonary effort is normal.  Skin:    Findings: Laceration present.     Comments: Approx 5 cm long laceration present along the right lower leg/ shin    Neurological:     Mental Status: She is alert and oriented to person, place, and time.  Psychiatric:        Attention and Perception: Attention and perception normal.        Mood and Affect: Mood and affect normal.        Speech: Speech normal.        Behavior: Behavior normal. Behavior is cooperative.        Media Information   Document Information  Photographic Image: Photos  Right lower leg laceration  07/04/2024 12:42  Attached To:  Hospital Encounter on 07/04/24  Source Information  Maricarmen Braziel E, PA-C  [facility]    UC Treatments / Results  Labs (all labs ordered are listed, but only abnormal results are displayed) Labs  Reviewed - No data to display  EKG   Radiology No results found.  Procedures Laceration Repair  Date/Time: 07/04/2024 1:40 PM  Performed by: Marylene Rocky BRAVO, PA-C Authorized by: Vash Quezada E, PA-C   Consent:    Consent obtained:  Verbal   Consent given by:  Patient   Risks, benefits, and alternatives were discussed: yes     Risks discussed:  Pain, poor cosmetic result, need for additional repair, infection and poor wound healing   Alternatives discussed:  No treatment Universal protocol:    Procedure explained and questions answered to patient or proxy's satisfaction: yes     Patient identity confirmed:  Verbally with patient Anesthesia:    Anesthesia method:  Local infiltration   Local anesthetic:  Lidocaine  1% w/o epi Laceration details:    Location:  Leg   Leg location:  R lower leg   Length (cm):  5.5   Depth (mm):  5 Pre-procedure details:    Preparation:  Patient was prepped and draped in usual sterile fashion Exploration:    Limited defect created (wound extended): no  Hemostasis achieved with:  Direct pressure   Contaminated: no   Treatment:    Wound cleansed with: Dermal wound cleanser.   Amount of cleaning:  Standard   Irrigation solution:  Sterile water   Irrigation volume:  500 ml   Irrigation method:  Syringe   Debridement:  None   Undermining:  None   Scar revision: no   Skin repair:    Repair method:  Sutures and Steri-Strips   Suture size:  5-0   Suture material:  Prolene   Suture technique:  Simple interrupted   Number of sutures:  10   Number of Steri-Strips:  3 Approximation:    Approximation:  Close Repair type:    Repair type:  Simple Post-procedure details:    Dressing:  Non-adherent dressing and adhesive bandage   Procedure completion:  Tolerated well, no immediate complications  (including critical care time)  Medications Ordered in UC Medications - No data to display  Initial Impression / Assessment and Plan / UC Course  I  have reviewed the triage vital signs and the nursing notes.  Pertinent labs & imaging results that were available during my care of the patient were reviewed by me and considered in my medical decision making (see chart for details).      Final Clinical Impressions(s) / UC Diagnoses   Final diagnoses:  Laceration of skin of right lower leg, initial encounter     Discharge Instructions      You were seen today for a laceration along your right lower leg.  We were able to cleanse the wound as well as close it with 10, simple interrupted sutures as well as 3 Steri-Strips.  You will need to return in the next 7 to 10 days to have the Steri-Strips and sutures removed to ensure that the wound is healing appropriately.  To help prevent infection I am sending you home with an antibiotic ointment called mupirocin .  You can apply this to the area twice per day for at least 7 days. I would recommend keeping the dressing that I placed on it for the next 24 hours and tomorrow morning you can take it off and cleanse the wound.  If needed you can soak or slightly wet the adhesive gauze to help it come off and not rip your skin. You can use warm water and a gentle soap to help cleanse the area.  Please do not scrub the wound or use harsh cleanser such as peroxide or alcohol as this can cause further irritation.  If you notice any of the following symptoms please return or follow-up with your PCP: Swelling around the wound, the wound reopens, there is profuse bleeding or drainage that looks like pus, severe leg pain, difficulty moving your leg or walking.     ED Prescriptions     Medication Sig Dispense Auth. Provider   mupirocin  ointment (BACTROBAN ) 2 % Apply 1 Application topically 2 (two) times daily for 7 days. 22 g Wandy Bossler E, PA-C      PDMP not reviewed this encounter.      [1] Social History Tobacco Use   Smoking status: Former    Current packs/day: 0.00    Average packs/day: 1  pack/day for 13.0 years (13.0 ttl pk-yrs)    Types: Cigarettes    Start date: 06/07/1978    Quit date: 06/08/1991    Years since quitting: 33.0    Passive exposure: Never   Smokeless tobacco: Never  Vaping Use  Vaping status: Never Used  Substance Use Topics   Alcohol use: No   Drug use: No  "

## 2024-07-06 ENCOUNTER — Ambulatory Visit: Attending: Internal Medicine

## 2024-07-06 ENCOUNTER — Telehealth: Payer: Self-pay

## 2024-07-06 DIAGNOSIS — E1169 Type 2 diabetes mellitus with other specified complication: Secondary | ICD-10-CM

## 2024-07-06 DIAGNOSIS — E785 Hyperlipidemia, unspecified: Secondary | ICD-10-CM

## 2024-07-06 NOTE — Assessment & Plan Note (Signed)
 Assessment:  LDL goal: < 70  mg/dl; last LDLc 868 mg/dl (11/7972); liver enzymes wnl Tolerates atorvastatin  and colesevelam  well without any side effects  Intolerance to ezetimibe   Discussed next potential options (PCSK-9 inhibitors); cost, dosing efficacy, side effects  Reinforced importance of heart healthy diet and regular physical activity  Plan: Continue taking atorvastatin  80 mg daily and colesevelam  625 mg three tabs twice daily Will stop colesevelam  once Repatha  obtained  Will apply for PA for PCSK9i; will inform patient upon approval  Lipid lab due in 3 months after starting PCSK9i

## 2024-07-06 NOTE — Progress Notes (Signed)
 Patient ID: Debra Leach                 DOB: 01/08/1951                    MRN: [id number]      HPI: Debra Leach is a 74 y.o. female patient referred to lipid clinic by Dr. Burton. PMH is significant for CAD (calcium  score of 1688), hx of multiple blood clots, T2DM, and HTN.  Patient was evaluated by Dr. Burton in December 2025 and cholesterol levels remained elevated on current therapy. Patient referred to PharmD for [facility] therapy.   Patient presents today for PharmD lipid clinic. Patient is currently taking atorvastatin  80 mg daily and colesevelam  625 mg three tabs twice daily for HLD management. She is tolerating current regimen well without any side effects. Welchol  was added to atorvastatin  several years ago to lower cholesterol as patient did not tolerate ezetimibe  well.   We reviewed options for lowering LDL cholesterol, including ezetimibe , PCSK-9 inhibitors. Discussed mechanisms of action, dosing, side effects and potential decreases in LDL cholesterol.  Also reviewed cost information and potential options for patient assistance.   Current Medications: atorvastatin  80 mg daily and colesevelam  625 mg three tabs twice daily  Intolerances: rosuvastatin (myalgias), ezetimibe  (unable to tolerate/ineffective?) Risk Factors: CAD (calcium  score of 1688), hx of multiple blood clots, T2DM, and HTN LDL goal: < 70 Lipid panel (08/2023): Chol 221, Trig 164, HDL 57, LDL 131 Liver enzymes (01/2024): AST 18, ALT 19, Alk phos 61  Diet:  Breakfast: Bowl of cereal: about ? corn Chex, [employer], with water, Cup of tea Lunch /Dinner: Avoids all citrus due to migraines Typically eats mozzarella sticks, crackers, and a cashew butter sandwich No deli meats or processed meats Eats steak or a burger only occasionally (once in a blue moon) Beverages: Drinks from a 16-oz tumbler, usually fills up several times per day  Exercise:  None for a period of time as she was regaining  function of left foot/leg; she is slowly regaining function and has been on stationary bike ~ 20 minutes for 2 days   Family History:  Relation Problem Comments  Mother (Deceased) Bone cancer     Father (Deceased) Hypertension   Mitral valve prolapse     Brother Metallurgist) Arthritis   Asthma   Colon polyps   Nephrolithiasis     Brother Metallurgist) Asthma   COPD     Maternal Aunt Breast cancer     Maternal Aunt Breast cancer     Neg Hx Colon cancer   Esophageal cancer   Inflammatory bowel disease   Liver disease   Pancreatic cancer   Rectal cancer   Stomach cancer       Social History:  Alcohol: none Smoking: none, prior smoker quit 35 years ago   Labs:  Lipid Panel     Component Value Date/Time   CHOL 221 (H) 08/26/2023 0758   TRIG 164.0 (H) 08/26/2023 0758   HDL 57.00 08/26/2023 0758   CHOLHDL 4 08/26/2023 0758   VLDL 32.8 08/26/2023 0758   LDLCALC 131 (H) 08/26/2023 0758   LDLCALC 178 (H) 04/22/2023 1439   LDLDIRECT 164.0 12/29/2022 0738    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HCC)    Cataract    Chronic kidney disease    Clotting disorder  Complication of anesthesia    difficult waking   Diabetes mellitus without complication (HCC)    Diet and exercise controlled   Diverticulosis    DVT (deep venous thrombosis) (HCC) 02/2019   3calf 2 right lung 1 left lung   DVT (deep venous thrombosis) (HCC) 2020   right leg   Dyspnea    GERD (gastroesophageal reflux disease)    Headache    Hepatic steatosis    Hiatal hernia    History of kidney stones    Hyperlipidemia    Hypertension    MVP (mitral valve prolapse)    history of   PONV (postoperative nausea and vomiting)    Pulmonary emboli (HCC) 03/01/2019   2 in the right; 1 in the left    Medications Ordered Prior to Encounter[1]  Allergies[2]  Assessment/Plan:  1. Hyperlipidemia -  Problem   Hyperlipidemia Associated With Type 2 Diabetes Mellitus (Hcc)   Qualifier: Diagnosis of  By: Avelina MD, Amy      Hyperlipidemia associated with type 2 diabetes mellitus (HCC) Assessment:  LDL goal: < 70  mg/dl; last LDLc 868 mg/dl (11/7972); liver enzymes wnl Tolerates atorvastatin  and colesevelam  well without any side effects  Intolerance to ezetimibe   Discussed next potential options (PCSK-9 inhibitors); cost, dosing efficacy, side effects  Reinforced importance of heart healthy diet and regular physical activity  Plan: Continue taking atorvastatin  80 mg daily and colesevelam  625 mg three tabs twice daily Will stop colesevelam  once Repatha  obtained  Will apply for PA for PCSK9i; will inform patient upon approval  Lipid lab due in 3 months after starting PCSK9i    Thank you,  Letonia Stead E. Cing [place], Pharm.D, CPP [place] [facility]. [facility] [address], [place], [place] [address] Phone: [phone]; Fax: [phone]        [1]  Current Outpatient Medications on File Prior to Visit  Medication Sig Dispense Refill   Accu-Chek FastClix Lancets MISC Check blood sugar twice daily 102 each 3   aluminum-magnesium  hydroxide 200-200 MG/5ML suspension Take by mouth every 6 (six) hours as needed for indigestion. As needed     apixaban  (ELIQUIS ) 2.5 MG TABS tablet TAKE 1 TABLET BY MOUTH 2 TIMES DAILY. 180 tablet 3   atorvastatin  (LIPITOR ) 80 MG tablet TAKE 1 TABLET BY MOUTH DAILY 90 tablet 3   Blood Glucose Monitoring Suppl (ACCU-CHEK GUIDE ME) w/Device KIT Check blood sugar twice daily 1 kit 0   budesonide  (ENTOCORT EC ) 3 MG 24 hr capsule TAKE 2 CAPSULES BY MOUTH DAILY. 180 capsule 12   colesevelam  (WELCHOL ) 625 MG tablet TAKE 3 TABLETS BY MOUTH 2 TIMES DAILY WITH A MEAL. 540 tablet 3   Continuous Glucose Sensor (DEXCOM G7 SENSOR) MISC 1 Device by Does not apply route as directed. 9 each 3   Eptinezumab-jjmr (VYEPTI) 100 MG/ML injection Inject  300 mg into the vein every 3 (three) months.     famotidine  (PEPCID ) 10 MG tablet Take 10 mg by mouth 2 (two) times daily.     ferrous gluconate  (FERGON) 324 MG tablet Take 1 tablet (324 mg total) by mouth daily with breakfast. 90 tablet 3   gabapentin  (NEURONTIN ) 100 MG capsule TAKE 1 CAPSULE BY MOUTH UP TO 3 TIMES DAILY AS NEEDED. (Patient taking differently: Take 100 mg by mouth 3 (three) times daily. TAKE 1 CAPSULE BY MOUTH UP TO 3 TIMES DAILY AS NEEDED.) 270 capsule 3   gabapentin  (NEURONTIN ) 300 MG capsule TAKE 2  CAPSULES (600 MG TOTAL) BY MOUTH AT BEDTIME. 180 capsule 0   glucose blood (ACCU-CHEK GUIDE TEST) test strip Check blood sugar twice daily 100 each 12   insulin  glargine (LANTUS  SOLOSTAR) 100 UNIT/ML Solostar Pen Inject 4 Units into the skin daily. 15 mL 3   Insulin  Pen Needle 32G X 4 MM MISC 1 Device by Does not apply route in the morning, at noon, in the evening, and at bedtime. 400 each 3   losartan  (COZAAR ) 25 MG tablet Take 1 tablet (25 mg total) by mouth daily. 90 tablet 3   meclizine  (ANTIVERT ) 25 MG tablet TAKE 1 TABLET BY MOUTH 3 TIMES DAILY AS NEEDED FOR DIZZINESS. 30 tablet 1   mupirocin  ointment (BACTROBAN ) 2 % Apply 1 Application topically 2 (two) times daily for 7 days. 22 g 0   ondansetron  (ZOFRAN ) 8 MG tablet Take 1 tablet (8 mg total) by mouth every 8 (eight) hours as needed for nausea or vomiting. 30 tablet 1   pantoprazole  (PROTONIX ) 40 MG tablet TAKE 1 TABLET (40 MG TOTAL) BY MOUTH DAILY. 90 tablet 3   potassium chloride  SA (KLOR-CON  M) 20 MEQ tablet TAKE 1 TABLET BY MOUTH DAILY. 90 tablet 3   repaglinide  (PRANDIN ) 2 MG tablet Take 4 mg with breakfast, 2 mg with lunch, and 4 mg with supper 450 tablet 3   triamcinolone (NASACORT ALLERGY 24HR) 55 MCG/ACT AERO nasal inhaler Place 2 sprays into the nose daily.     UBRELVY  100 MG TABS TAKE 1 TABLET BY MOUTH AS NEEDED (MAY REPEAT DOSE AFTER 2 HOURS IF NEEDED. MAXIMUM 2 TABLETS IN 24 HOURS). 10 tablet 3   vitamin B-12  (CYANOCOBALAMIN ) 1000 MCG tablet Take 1,000 mcg by mouth daily with breakfast.     Vitamin D , Ergocalciferol , (DRISDOL ) 1.25 MG (50000 UNIT) CAPS capsule TAKE 1 CAPSULE BY MOUTH ONCE A WEEK. 12 capsule 3   Current Facility-Administered Medications on File Prior to Visit  Medication Dose Route Frequency Provider Last Rate Last Admin   fidaxomicin  (DIFICID ) tablet 200 mg  200 mg Oral BID       [2]  Allergies Allergen Reactions   Clindamycin/Lincomycin Anaphylaxis   Atenolol  Other (See Comments)    Migraine   Codeine Itching and Swelling    REACTION: Migraine   Epinephrine  Itching   Hydrocodone Other (See Comments)    Migraine   Ibuprofen Other (See Comments)    Migraine   Jardiance  [Empagliflozin ] Other (See Comments)    Caused uti Yeast infection   Metronidazole  Swelling    Mouth and lips   Oxycodone Other (See Comments)    Migraine   Penicillins Itching and Swelling    ** Tolerates cephalosporins Has patient had a PCN reaction causing immediate rash, facial/tongue/throat swelling, SOB or lightheadedness with hypotension: No Has patient had a PCN reaction causing severe rash involving mucus membranes or skin necrosis: No Has patient had a PCN reaction that required hospitalization: No Has patient had a PCN reaction occurring within the last 10 years: No     Promethazine  Other (See Comments)    migraine   Propranolol  Other (See Comments)    Migraine   Sudafed [Pseudoephedrine Hcl] Hives, Itching and Anxiety   Talwin [Pentazocine] Other (See Comments)    Swelling and itching    Tramadol Other (See Comments)    Migraine   Vancomycin  Swelling    Swelling of Lips and Mouth   Crestor [Rosuvastatin] Other (See Comments)    Muscle pain

## 2024-07-06 NOTE — Patient Instructions (Addendum)
 Your Results:             Your most recent labs Goal  Total Cholesterol 221 < 200  Triglycerides 164 < 150  HDL (happy/good cholesterol) 57 > 40  LDL (lousy/bad cholesterol 131 < 70   Medication changes: Continue atorvastatin  and colesevelam  for now We will start the process to get Repatha /Praluent covered by your insurance.  Once the prior authorization is complete, we will call you to let you know and confirm pharmacy information.   Lab orders:We want to repeat labs 3 months after starting PCSK9i.  We will send you a lab        order to remind you once we get closer to that time.    Annah Jasko E. Blaine Guiffre, Pharm.D, CPP [place] [facility]. [facility] [address], [place], [place] [address] Phone: [phone]; Fax: [phone]    [facility] locations: [facility] - [address] first floor (new Heart and Vascular center) - [address] ([facility]) - [phone]    [facility] [address]    [place]  - [address]    [place] - [address] - [address] [employer]  - [address] - [address] ([facility])   Praluent is a cholesterol medication that improved your body's ability to get rid of bad cholesterol known as LDL. It can lower your LDL up to 60%. It is an injection that is given under the skin every 2 weeks. The most common side effects of Praluent include runny nose, symptoms of the common cold, rarely flu or flu-like symptoms, back/muscle pain in about 3-4% of the patients, and redness, pain, or bruising at the injection site.    Repatha  is a cholesterol medication that improved your body's ability to get rid of bad cholesterol known as LDL. It can lower your LDL up to 60%! It is an injection that is given under the skin every 2 weeks. The medication often  requires a prior authorization from your insurance company. We will take care of submitting all the necessary information to your insurance company to get it approved. The most common side effects of Repatha  include runny nose, symptoms of the common cold, rarely flu or flu-like symptoms, back/muscle pain in about 3-4% of the patients, and redness, pain, or bruising at the injection site.    It is also recommended that patients with high cholesterol adhere to a heart healthy diet, get regular exercise, avoid use of tobacco products, and maintain a healthy weight. Steps that you can take to help in these areas:  Limit consumption of trans fats, saturated fats, and cholesterol in your diet  Increase intake of lean meats such as chicken, turkey, and fish  Increase intake of foods rich in fiber such as fresh fruits, vegetables, beans and oatmeal Exercise as you are able; even 30 minutes of walking daily can aid in increasing heart health      Copay Assistance:  The [employer] foundation offers assistance to help pay for medication copays.  They will cover copays for all cholesterol lowering meds, including statins, fibrates, omega-3 oils, ezetimibe , Repatha , Praluent, Nexletol, Nexlizet.  The cards are usually good for $2,500 or 12 months, whichever comes first. Go to healthwellfoundation.org Click on Apply Now Answer questions as to  whom is applying (patient or representative) Your disease fund will be hypercholesterolemia - Medicare access Select the cholesterol medication you need assistance with (Repatha , Praluent, Nexlizet...) They will ask question about qualifying diagnosis - you can mark yes; and do you have insurance coverage.   When they ask what type of assistance you are interested in - copay assistance When you submit, the approval is usually within minutes.  You will need to print the card information from the site You will need to show this information to your pharmacy, they  will bill your Medicare Part D plan first -then bill [employer] --for the copay.   You can also call them at [phone], although the hold times can be quite long.

## 2024-07-09 ENCOUNTER — Telehealth: Payer: Self-pay | Admitting: Pharmacy Technician

## 2024-07-09 ENCOUNTER — Other Ambulatory Visit (HOSPITAL_COMMUNITY): Payer: Self-pay

## 2024-07-09 MED ORDER — REPATHA SURECLICK 140 MG/ML ~~LOC~~ SOAJ: 140.0000 mg | SUBCUTANEOUS | 2 refills | Status: AC

## 2024-07-09 NOTE — Telephone Encounter (Signed)
 Called patient to discuss Repatha  copay. Patient is okay with proceeding with therapy at this time. Instructed her to discontinue Welchol , continue atorvastatin  and begin Repatha . Patient verbalized understanding.

## 2024-07-09 NOTE — Telephone Encounter (Signed)
" ° °  Pharmacy Patient Advocate Encounter   Received notification from Pt Calls Messages that prior authorization for REPATHA  is required/requested.   Insurance verification completed.   The patient is insured through [facility]   Per test claim: PA required; PA submitted to above mentioned insurance via Latent Key/confirmation #/EOC [id number] Status is pending    Approved 35.00 per test claim "

## 2024-07-10 ENCOUNTER — Other Ambulatory Visit: Payer: Self-pay | Admitting: Neurology

## 2024-07-10 DIAGNOSIS — G43009 Migraine without aura, not intractable, without status migrainosus: Secondary | ICD-10-CM

## 2024-07-10 DIAGNOSIS — G43709 Chronic migraine without aura, not intractable, without status migrainosus: Secondary | ICD-10-CM

## 2024-07-10 DIAGNOSIS — G43109 Migraine with aura, not intractable, without status migrainosus: Secondary | ICD-10-CM

## 2024-07-13 ENCOUNTER — Inpatient Hospital Stay: Admission: RE | Admit: 2024-07-13 | Discharge: 2024-07-13

## 2024-07-13 ENCOUNTER — Other Ambulatory Visit: Payer: Self-pay

## 2024-07-13 VITALS — BP 147/77 | HR 71 | Temp 98.1°F | Resp 16

## 2024-07-13 DIAGNOSIS — Z4802 Encounter for removal of sutures: Secondary | ICD-10-CM

## 2024-07-13 NOTE — ED Triage Notes (Addendum)
 Pt presents here for suture removal today. Laceration occurred on 1/28 to right lower leg. 10 sutures and 3 steri-strips present on arrival. Pt has no concerns. Has been applying the Bactroban  ointment to area.   On RN assessment, skin surrounding sutures is a bright red color and steri-strips are visibly soiled (brown discoloration). Pt voicing tenderness to the touch.

## 2024-07-13 NOTE — ED Provider Notes (Signed)
 [facility]    CSN: [id number] Arrival date & time: 07/13/24  9162      History   Chief Complaint Chief Complaint  Patient presents with   Suture / Staple Removal    Remove stitches on right shin from visit on Jan 28 - Entered by patient    HPI Debra Leach is a 74 y.o. female.   HPI  Pt is here today for suture and steristrip removal after a visit on 07/04/24.  She sustained a laceration of the right lower leg which was repaired using 10 simple interrupted sutures along with 3 Steri-Strips.  She reports that the area is a bit tender and she has had a mild amount of bleeding along the top of the laceration but denies drainage that looks like pus.  She reports that she has been using mupirocin  ointment twice per day and has been trying to rest to prevent further irritation or reopening of the wound while at home.   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HCC)    Cataract    Chronic kidney disease    Clotting disorder    Complication of anesthesia    difficult waking   Diabetes mellitus without complication (HCC)    Diet and exercise controlled   Diverticulosis    DVT (deep venous thrombosis) (HCC) 02/2019   3calf 2 right lung 1 left lung   DVT (deep venous thrombosis) (HCC) 2020   right leg   Dyspnea    GERD (gastroesophageal reflux disease)    Headache    Hepatic steatosis    Hiatal hernia    History of kidney stones    Hyperlipidemia    Hypertension    MVP (mitral valve prolapse)    history of   PONV (postoperative nausea and vomiting)    Pulmonary emboli (HCC) 03/01/2019   2 in the right; 1 in the left    Patient Active Problem List   Diagnosis Date Noted   Flank pain 01/31/2024   RUQ pain 01/31/2024   Facial pain 11/15/2023   Hoarseness, persistent 04/22/2023   History of Clostridioides difficile infection 02/13/2023   Chronic  anticoagulation 02/13/2023   Acute diarrhea 09/21/2022   Current chronic use of systemic steroids 09/02/2022   Chronic kidney disease, stage 3a (HCC) 08/19/2022   Pre-operative examination 05/11/2022   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   Colon cancer screening 02/14/2022   Immunosuppression due to chronic steroid use 02/13/2022   Iron  deficiency anemia 02/13/2022   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   Type 2 diabetes mellitus with stage 3a chronic kidney disease, with long-term current use of insulin  (HCC) 07/10/2021   Multinodular goiter 07/10/2021   Spondylolisthesis at L4-L5 level 04/24/2021   Thyroid  nodule 04/11/2020   Actinic keratosis 09/26/2019   Dyspnea on minimal exertion 07/13/2019   Hypertension associated with diabetes (HCC) 07/06/2019   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HCC) 07/06/2019   Multiple thyroid  nodules 03/27/2019   History of DVT (deep vein thrombosis) 03/13/2019   History of pulmonary embolism 03/01/2019   Breast nodule 03/01/2019   Abnormal renal finding 06/29/2018   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HCC) 08/16/2017   Eustachian tube disorder, bilateral 12/16/2016   Esophageal dysphagia 11/22/2016   Chronic low back pain 10/07/2016   Aortic atherosclerosis 06/11/2016   Family history of aortic aneurysm 08/20/2014   Vitamin D  deficiency 08/20/2014  Osteoporosis 11/06/2009   Hyperlipidemia associated with type 2 diabetes mellitus (HCC) 07/05/2008   INSOMNIA, CHRONIC 07/05/2008   Migraine with aura 07/05/2008   GERD 07/05/2008   Osteoarthritis 07/05/2008   MITRAL VALVE PROLAPSE, HX OF 07/05/2008   NEPHROLITHIASIS, HX OF 07/05/2008    Past Surgical History:  Procedure Laterality Date   arm surgery  2012   left forearm fracture w/ plates pins screws   ARTERY BIOPSY Right 12/03/2021   Procedure: BIOPSY TEMPORAL ARTERY;  Surgeon: Marea Selinda RAMAN, MD;  Location: [facility];  Service: Vascular;  Laterality: Right;  Right temple   BREAST BIOPSY  Left    BREAST SURGERY  2000   breast biopsy (benign)   CATARACT EXTRACTION Right 2005   CATARACT EXTRACTION Left 2010   LITHOTRIPSY  08/12/2008   stent placed bilaterally   PANCREAS BIOPSY  2021   PANCREAS BIOPSY  2019   PARTIAL HYSTERECTOMY     Both ovaries remain, vaginal, for mennorhagia   REPAIR EXTENSOR TENDON Right 06/30/2022   Procedure: Middle and ring finger sagittal band reconstruction;  Surgeon: Romona Harari, MD;  Location: [facility];  Service: Orthopedics;  Laterality: Right;   TENDON REPAIR Left 04/06/2023   Procedure: MIDDLE AND RING FINGER SAGITTAL BAND RECONSTRUCTION;  Surgeon: Romona Harari, MD;  Location: [facility];  Service: Orthopedics;  Laterality: Left;  regional120   TONSILLECTOMY     TUBAL LIGATION  1980    OB History   No obstetric history on file.      Home Medications    Prior to Admission medications  Medication Sig Start Date End Date Taking? Authorizing Provider  Accu-Chek FastClix Lancets MISC Check blood sugar twice daily 12/15/23   Shamleffer, Ibtehal Jaralla, MD  aluminum-magnesium  hydroxide 200-200 MG/5ML suspension Take by mouth every 6 (six) hours as needed for indigestion. As needed    [provider]  apixaban  (ELIQUIS ) 2.5 MG TABS tablet TAKE 1 TABLET BY MOUTH 2 TIMES DAILY. 10/14/23   Bedsole, Amy E, MD  atorvastatin  (LIPITOR ) 80 MG tablet TAKE 1 TABLET BY MOUTH DAILY 10/14/23   Bedsole, Amy E, MD  Blood Glucose Monitoring Suppl (ACCU-CHEK GUIDE ME) w/Device KIT Check blood sugar twice daily 12/15/23   Shamleffer, Ibtehal Jaralla, MD  budesonide  (ENTOCORT EC ) 3 MG 24 hr capsule TAKE 2 CAPSULES BY MOUTH DAILY. 05/28/24   Mansouraty, Aloha Raddle., MD  Continuous Glucose Sensor (DEXCOM G7 SENSOR) MISC 1 Device by Does not apply route as directed. 07/13/23   Shamleffer, Ibtehal Jaralla, MD  Eptinezumab-jjmr (VYEPTI) 100 MG/ML injection Inject 300 mg into the vein every 3 (three) months.    [provider]   Evolocumab  (REPATHA  SURECLICK) 140 MG/ML SOAJ Inject 140 mg into the skin every 14 (fourteen) days. 07/09/24   O'NealDarryle Ned, MD  famotidine  (PEPCID ) 10 MG tablet Take 10 mg by mouth 2 (two) times daily.    [provider]  ferrous gluconate  (FERGON) 324 MG tablet Take 1 tablet (324 mg total) by mouth daily with breakfast. 03/29/24   Bedsole, Amy E, MD  gabapentin  (NEURONTIN ) 100 MG capsule TAKE 1 CAPSULE BY MOUTH UP TO 3 TIMES DAILY AS NEEDED. Patient taking differently: Take 100 mg by mouth 3 (three) times daily. TAKE 1 CAPSULE BY MOUTH UP TO 3 TIMES DAILY AS NEEDED. 01/05/24   Skeet, Adam R, DO  gabapentin  (NEURONTIN ) 300 MG capsule TAKE 2 CAPSULES (600 MG TOTAL) BY MOUTH AT BEDTIME. 07/12/24   Skeet, Adam R, DO  glucose  blood (ACCU-CHEK GUIDE TEST) test strip Check blood sugar twice daily 12/15/23   Shamleffer, Ibtehal Jaralla, MD  insulin  glargine (LANTUS  SOLOSTAR) 100 UNIT/ML Solostar Pen Inject 4 Units into the skin daily. 06/21/24   Shamleffer, Ibtehal Jaralla, MD  Insulin  Pen Needle 32G X 4 MM MISC 1 Device by Does not apply route in the morning, at noon, in the evening, and at bedtime. 06/21/24   Shamleffer, Ibtehal Jaralla, MD  losartan  (COZAAR ) 25 MG tablet Take 1 tablet (25 mg total) by mouth daily. 06/08/24 09/06/24  O'NealDarryle Ned, MD  meclizine  (ANTIVERT ) 25 MG tablet TAKE 1 TABLET BY MOUTH 3 TIMES DAILY AS NEEDED FOR DIZZINESS. 02/09/24   Skeet Juliene SAUNDERS, DO  ondansetron  (ZOFRAN ) 8 MG tablet Take 1 tablet (8 mg total) by mouth every 8 (eight) hours as needed for nausea or vomiting. 10/05/23   Mansouraty, Aloha Raddle., MD  pantoprazole  (PROTONIX ) 40 MG tablet TAKE 1 TABLET (40 MG TOTAL) BY MOUTH DAILY. 04/27/24   Bedsole, Amy E, MD  potassium chloride  SA (KLOR-CON  M) 20 MEQ tablet TAKE 1 TABLET BY MOUTH DAILY. 10/14/23   Bedsole, Amy E, MD  repaglinide  (PRANDIN ) 2 MG tablet Take 4 mg with breakfast, 2 mg with lunch, and 4 mg with supper 06/21/24   Shamleffer, Ibtehal Jaralla, MD   triamcinolone (NASACORT ALLERGY 24HR) 55 MCG/ACT AERO nasal inhaler Place 2 sprays into the nose daily.    [provider]  UBRELVY  100 MG TABS TAKE 1 TABLET BY MOUTH AS NEEDED (MAY REPEAT DOSE AFTER 2 HOURS IF NEEDED. MAXIMUM 2 TABLETS IN 24 HOURS). 12/15/23   Skeet Juliene SAUNDERS, DO  vitamin B-12 (CYANOCOBALAMIN ) 1000 MCG tablet Take 1,000 mcg by mouth daily with breakfast. 08/15/20   [provider]  Vitamin D , Ergocalciferol , (DRISDOL ) 1.25 MG (50000 UNIT) CAPS capsule TAKE 1 CAPSULE BY MOUTH ONCE A WEEK. 06/19/24   Bedsole, Amy E, MD    Family History Family History  Problem Relation Age of Onset   Bone cancer Mother    Hypertension Father    Mitral valve prolapse Father    Colon polyps Brother    Asthma Brother    Arthritis Brother    Nephrolithiasis Brother    Asthma Brother    COPD Brother    Breast cancer Maternal Aunt    Breast cancer Maternal Aunt    Colon cancer Neg Hx    Esophageal cancer Neg Hx    Inflammatory bowel disease Neg Hx    Liver disease Neg Hx    Pancreatic cancer Neg Hx    Rectal cancer Neg Hx    Stomach cancer Neg Hx     Social History Social History[1]   Allergies   Clindamycin/lincomycin, Atenolol , Codeine, Epinephrine , Hydrocodone, Ibuprofen, Jardiance  [empagliflozin ], Metronidazole , Oxycodone, Penicillins, Promethazine , Propranolol , Sudafed [pseudoephedrine hcl], Talwin [pentazocine], Tramadol, Vancomycin , and Crestor [rosuvastatin]   Review of Systems Review of Systems   Physical Exam Triage Vital Signs ED Triage Vitals  Encounter Vitals Group     BP 07/13/24 0849 (!) 147/77     Girls Systolic BP Percentile --      Girls Diastolic BP Percentile --      Boys Systolic BP Percentile --      Boys Diastolic BP Percentile --      Pulse Rate 07/13/24 0849 71     Resp 07/13/24 0849 16     Temp 07/13/24 0849 98.1 F (36.7 C)     Temp Source 07/13/24 0849 Oral  SpO2 07/13/24 0849 96 %     Weight --      Height --      Head  Circumference --      Peak Flow --      Pain Score 07/13/24 0851 0     Pain Loc --      Pain Education --      Exclude from Growth Chart --    No data found.  Updated Vital Signs BP (!) 147/77 (BP Location: Right Arm)   Pulse 71   Temp 98.1 F (36.7 C) (Oral)   Resp 16   SpO2 96%   Visual Acuity Right Eye Distance:   Left Eye Distance:   Bilateral Distance:    Right Eye Near:   Left Eye Near:    Bilateral Near:     Physical Exam Vitals reviewed.  Constitutional:      General: She is awake.     Appearance: Normal appearance. She is well-developed and well-groomed.  HENT:     Head: Normocephalic and atraumatic.  Eyes:     General: Lids are normal. Gaze aligned appropriately.     Extraocular Movements: Extraocular movements intact.     Conjunctiva/sclera: Conjunctivae normal.  Pulmonary:     Effort: Pulmonary effort is normal.  Skin:    Findings: Laceration present.      Neurological:     Mental Status: She is alert and oriented to person, place, and time.  Psychiatric:        Attention and Perception: Attention and perception normal.        Mood and Affect: Mood and affect normal.        Speech: Speech normal.        Behavior: Behavior normal. Behavior is cooperative.      Media Information   Document Information  Photographic Image: Photos  Lac repair presenting for suture removal  07/13/2024 08:58  Attached To:  Hospital Encounter on 07/13/24 with [facility] PROVIDER  Source Information  Cortlynn Hollinsworth, Rocky BRAVO, PA-C  [facility]     Media Information   Document Information  Photographic Image: Photos  Lac and steristrips removed  07/13/2024 09:02  Attached To:  Hospital Encounter on 07/13/24 with [facility] PROVIDER  Source Information  Lajuan Kovaleski, Rocky BRAVO, PA-C  [facility]    UC Treatments / Results  Labs (all labs ordered are listed, but only abnormal results are displayed) Labs Reviewed - No data to  display  EKG   Radiology No results found.  Procedures Procedures (including critical care time)  Medications Ordered in UC Medications - No data to display  Initial Impression / Assessment and Plan / UC Course  I have reviewed the triage vital signs and the nursing notes.  Pertinent labs & imaging results that were available during my care of the patient were reviewed by me and considered in my medical decision making (see chart for details).      Final Clinical Impressions(s) / UC Diagnoses   Final diagnoses:  Visit for suture removal   Patient is here today for suture removal following a laceration that occurred on 07/04/2024 and was repaired by myself.  Physical exam reveals largely well-approximated laceration of the right lower extremity with 10 intact, simple and opted sutures as well as 3 Steri-Strips.  Wound appears to be healing well but there is some slight erythema along the inferior aspect of the wound.  No obvious evidence of purulent drainage on exam.  At this time  I suspect this is largely due to wound healing.  Recommend that she continues to use mupirocin  ointment twice per day until wound has closed.  Recommend continued home measures such as cleansing the wound twice per day, daily dressing changes.  Reviewed with patient signs and symptoms of infection which would require further evaluation.  Follow-up as needed.   Discharge Instructions   None    ED Prescriptions   None    PDMP not reviewed this encounter.     [1]  Social History Tobacco Use   Smoking status: Former    Current packs/day: 0.00    Average packs/day: 1 pack/day for 13.0 years (13.0 ttl pk-yrs)    Types: Cigarettes    Start date: 06/07/1978    Quit date: 06/08/1991    Years since quitting: 33.1    Passive exposure: Never   Smokeless tobacco: Never  Vaping Use   Vaping status: Never Used  Substance Use Topics   Alcohol use: No   Drug use: No     Raine Blodgett, Rocky BRAVO, PA-C 07/13/24  1343  "

## 2024-07-30 ENCOUNTER — Ambulatory Visit: Admitting: Neurology

## 2024-08-02 ENCOUNTER — Ambulatory Visit: Payer: Medicare Other

## 2024-08-03 ENCOUNTER — Encounter: Payer: Self-pay | Admitting: Neurology

## 2024-08-06 ENCOUNTER — Ambulatory Visit: Admitting: Neurology

## 2024-08-08 ENCOUNTER — Ambulatory Visit

## 2024-09-04 ENCOUNTER — Encounter: Admitting: Family Medicine

## 2024-09-12 ENCOUNTER — Other Ambulatory Visit

## 2024-09-18 ENCOUNTER — Encounter: Admitting: Family Medicine

## 2024-09-20 ENCOUNTER — Ambulatory Visit: Admitting: Cardiovascular Disease

## 2024-10-05 ENCOUNTER — Ambulatory Visit: Admitting: Internal Medicine
